# Patient Record
Sex: Male | Born: 1955 | Race: White | Hispanic: No | Marital: Married | Smoking: Never smoker
Health system: Southern US, Community
[De-identification: ages and names within clinical notes are randomized; demographics above are authoritative.]

## PROBLEM LIST (undated history)

## (undated) DIAGNOSIS — R140 Abdominal distension (gaseous): Secondary | ICD-10-CM

## (undated) DIAGNOSIS — C801 Malignant (primary) neoplasm, unspecified: Secondary | ICD-10-CM

## (undated) DIAGNOSIS — C259 Malignant neoplasm of pancreas, unspecified: Secondary | ICD-10-CM

## (undated) DIAGNOSIS — Z87442 Personal history of urinary calculi: Secondary | ICD-10-CM

## (undated) DIAGNOSIS — M792 Neuralgia and neuritis, unspecified: Secondary | ICD-10-CM

## (undated) DIAGNOSIS — F439 Reaction to severe stress, unspecified: Secondary | ICD-10-CM

## (undated) DIAGNOSIS — I10 Essential (primary) hypertension: Secondary | ICD-10-CM

## (undated) DIAGNOSIS — Z87898 Personal history of other specified conditions: Secondary | ICD-10-CM

## (undated) DIAGNOSIS — H9190 Unspecified hearing loss, unspecified ear: Secondary | ICD-10-CM

## (undated) DIAGNOSIS — R5383 Other fatigue: Secondary | ICD-10-CM

## (undated) DIAGNOSIS — R109 Unspecified abdominal pain: Secondary | ICD-10-CM

## (undated) HISTORY — PX: SKIN CANCER EXCISION: SHX779

## (undated) HISTORY — DX: Malignant (primary) neoplasm, unspecified: C80.1

## (undated) HISTORY — DX: Abdominal distension (gaseous): R14.0

## (undated) HISTORY — DX: Unspecified abdominal pain: R10.9

## (undated) HISTORY — DX: Reaction to severe stress, unspecified: F43.9

## (undated) HISTORY — DX: Personal history of other specified conditions: Z87.898

## (undated) HISTORY — DX: Other fatigue: R53.83

## (undated) HISTORY — DX: Malignant neoplasm of pancreas, unspecified: C25.9

## (undated) HISTORY — DX: Unspecified hearing loss, unspecified ear: H91.90

## (undated) HISTORY — PX: APPENDECTOMY: SHX54

## (undated) HISTORY — PX: EYE SURGERY: SHX253

---

## 2006-05-08 ENCOUNTER — Ambulatory Visit: Payer: Self-pay | Admitting: Internal Medicine

## 2006-12-20 ENCOUNTER — Ambulatory Visit: Payer: Self-pay | Admitting: Internal Medicine

## 2006-12-28 ENCOUNTER — Ambulatory Visit: Payer: Self-pay | Admitting: Internal Medicine

## 2006-12-28 ENCOUNTER — Encounter: Payer: Self-pay | Admitting: Internal Medicine

## 2007-02-10 ENCOUNTER — Emergency Department (HOSPITAL_COMMUNITY): Admission: EM | Admit: 2007-02-10 | Discharge: 2007-02-10 | Payer: Self-pay | Admitting: Emergency Medicine

## 2007-02-12 ENCOUNTER — Ambulatory Visit (HOSPITAL_COMMUNITY): Admission: RE | Admit: 2007-02-12 | Discharge: 2007-02-12 | Payer: Self-pay | Admitting: Urology

## 2007-02-14 ENCOUNTER — Emergency Department (HOSPITAL_COMMUNITY): Admission: EM | Admit: 2007-02-14 | Discharge: 2007-02-14 | Payer: Self-pay | Admitting: Emergency Medicine

## 2007-02-21 ENCOUNTER — Ambulatory Visit (HOSPITAL_BASED_OUTPATIENT_CLINIC_OR_DEPARTMENT_OTHER): Admission: RE | Admit: 2007-02-21 | Discharge: 2007-02-21 | Payer: Self-pay | Admitting: Urology

## 2010-11-16 NOTE — Op Note (Signed)
[name], Cory Kelly               ACCOUNT NO.:  [id number]   MEDICAL RECORD NO.:  [id number]          PATIENT TYPE:  AMB   LOCATION:  [facility]                         FACILITY:  [facility]   PHYSICIAN:  Ronald L. Earlene Plater, M.D.  DATE OF BIRTH:  1956-03-10   DATE OF PROCEDURE:  02/21/2007  DATE OF DISCHARGE:                               OPERATIVE REPORT   PREOPERATIVE DIAGNOSIS:  Left ureterolithiasis, status post  extracorporeal shock wave lithotripsy.   OPERATIVE PROCEDURE:  1. Cystourethroscopy.  2. Left retrograde ureteral pyelogram.  3. Left rigid and flexible ureterorenoscopy.  4. Balloon dilatation of left ureteral stricture and placement of left      double-J stent.   SURGEON:  Lucrezia Starch. Earlene Plater, M.D.   ANESTHESIA:  LMA.   ESTIMATED BLOOD LOSS:  Negligible.   TUBES:  A 28 cm 7-French contoured double pigtail stent.   COMPLICATIONS:  None.   PROCEDURE:  Cory Kelly is a very nice 55 year old white male who  presented with left flank pain, nausea, vomiting.  He was found to have  an 8 mm left upper ureteral calculus.  He subsequently went ESWL which  caused partial fragmentation, but the large fragment has obstructed  upper ureter approximately 5 mm and [place] not progressing.  It is  continuing to cause pain and low grade fever.  Understanding the risks,  benefits and alternative, and being properly preoperatively prepared, he  was taken to surgery.   The patient was placed in supine position.  After proper LMA anesthesia,  was placed in the dorsal lithotomy position and prepped and draped with  Betadine in a sterile fashion.  Cystourethroscopy was performed with a  22.5 [place] Olympus panendoscope.  The bladder was essentially noted to  be normal. There was no significant prostatic hypertrophy and efflux of  urine was noted in the normally placed ureteral orifice on the right  side.  The left ureteral orifice was in normal location, but no reflux  of urine was noted.  The 0.038  [place] Glidewire was placed in the left  renal pelvis, past the stone and the lower ureter was dilated with an  inner dilating sheath of the ureteral access catheter.  Attempted rigid  ureteroscopy with the short thin ureteroscope, there was some angulation  of the vessels so this was abandoned.  Utilizing a dual lumen inserter,  a 0.038 [place] Amplatz super stiff wire was placed in the left renal  pelvis and ureteroscope was placed to just below the stone.  It was  noted to be quite tight.  On visualization there was a tight ureteral  stricture inches below the stone fragments.  This is what was felt to be  obstructing the stone.  A 6-French open-ended catheter was placed into  the left renal pelvis and the stone dislocated into the left renal  pelvis.  Dye was injected and there was what felt like some clot within  the kidney but no extravasation, no perforations were noted. Under  visualization with fluoroscopy, utilizing a 4-mm ureteral balloon  catheter to 8 atmospheres of pressure,  the stricture was visually  dilated and inflexible ureterorenoscopy was performed into the renal  pelvis.  Bloody urine was expressed and visualization was performed.  There were multiple small fragments appearing to be flushing around the  kidney.  It was felt that it was well fragmented enough, the chances of  passing the fragments very high with the stricture being dilated.  Flexible ureteroscope was visually removed and under fluoroscopic  guidance a 28-cm 7-French contoured double pigtail stent was placed and  noted be in good position in the left renal pelvis within the bladder.  The bladder was drained.  The panendoscope was removed.  The patient was  taken to recovery room stable.   Plan will be to remove the stent in approximately five days.   _ Left  retrograde ureteropyelogram was performed with a 6-French open-  ended catheter was no extravasation noted.  There was some clot within  the  renal pelvis but no other lesions were noted.  The patient tolerated  the procedure well procedure well.      Ronald L. Earlene Plater, M.D.  Electronically Signed     RLD/MEDQ  D:  02/21/2007  T:  02/22/2007  Job:  [id number]

## 2011-04-15 LAB — POCT HEMOGLOBIN-HEMACUE
Hemoglobin: 14.2
Operator id: 268271

## 2011-04-18 LAB — URINALYSIS, ROUTINE W REFLEX MICROSCOPIC
Nitrite: NEGATIVE
Protein, ur: 30 — AB
Specific Gravity, Urine: 1.021
Urobilinogen, UA: 0.2

## 2011-04-18 LAB — URINE CULTURE
Colony Count: NO GROWTH
Culture: NO GROWTH

## 2011-04-18 LAB — URINE MICROSCOPIC-ADD ON

## 2011-04-18 LAB — DIFFERENTIAL
Basophils Absolute: 0
Basophils Relative: 1
Eosinophils Absolute: 0.1
Eosinophils Relative: 1
Monocytes Absolute: 0.4
Monocytes Relative: 6

## 2011-04-18 LAB — CBC
Hemoglobin: 14.9
Platelets: 234
RDW: 13.2
WBC: 6.4

## 2011-04-18 LAB — COMPREHENSIVE METABOLIC PANEL
ALT: 21
Albumin: 4
Alkaline Phosphatase: 61
Chloride: 105
Potassium: 3.9
Sodium: 138
Total Bilirubin: 2.2 — ABNORMAL HIGH
Total Protein: 6.6

## 2011-10-17 ENCOUNTER — Other Ambulatory Visit: Payer: Self-pay

## 2011-10-17 NOTE — Telephone Encounter (Signed)
Patient has an appt scheduled with Dr Merla Riches on May 1st but ran out of his meds. He would like to know if we could call in enough to last him until his appt.

## 2011-10-17 NOTE — Telephone Encounter (Signed)
Please ask pt the dose of

## 2011-10-17 NOTE — Telephone Encounter (Signed)
Please pull chart so we can ascertain the dose of HCTZ

## 2011-10-17 NOTE — Telephone Encounter (Signed)
Pt needs HCTZ, enough to last him until his appt on May 1.   [facility] [address]

## 2011-10-18 ENCOUNTER — Encounter: Payer: Self-pay | Admitting: Internal Medicine

## 2011-10-18 MED ORDER — HYDROCHLOROTHIAZIDE 12.5 MG PO CAPS: 12.5000 mg | ORAL_CAPSULE | Freq: Every day | ORAL | Status: DC

## 2011-10-18 NOTE — Telephone Encounter (Signed)
I notified the daughter that this has been done

## 2011-11-02 ENCOUNTER — Other Ambulatory Visit: Payer: Self-pay | Admitting: Internal Medicine

## 2011-11-02 ENCOUNTER — Ambulatory Visit (INDEPENDENT_AMBULATORY_CARE_PROVIDER_SITE_OTHER): Payer: BC Managed Care – PPO | Admitting: Internal Medicine

## 2011-11-02 ENCOUNTER — Encounter: Payer: Self-pay | Admitting: Internal Medicine

## 2011-11-02 VITALS — BP 133/94 | HR 56 | Temp 98.5°F | Resp 16 | Ht 75.5 in | Wt 248.8 lb

## 2011-11-02 DIAGNOSIS — E785 Hyperlipidemia, unspecified: Secondary | ICD-10-CM | POA: Insufficient documentation

## 2011-11-02 DIAGNOSIS — I10 Essential (primary) hypertension: Secondary | ICD-10-CM | POA: Insufficient documentation

## 2011-11-02 DIAGNOSIS — Z0000 Encounter for general adult medical examination without abnormal findings: Secondary | ICD-10-CM

## 2011-11-02 LAB — LIPID PANEL: VLDL: 17 mg/dL (ref 0–40)

## 2011-11-02 LAB — COMPREHENSIVE METABOLIC PANEL
CO2: 30 mEq/L (ref 19–32)
Calcium: 9.4 mg/dL (ref 8.4–10.5)
Chloride: 104 mEq/L (ref 96–112)
Creat: 1.18 mg/dL (ref 0.50–1.35)
Glucose, Bld: 93 mg/dL (ref 70–99)
Total Bilirubin: 1.9 mg/dL — ABNORMAL HIGH (ref 0.3–1.2)

## 2011-11-02 MED ORDER — HYDROCHLOROTHIAZIDE 12.5 MG PO CAPS: 12.5000 mg | ORAL_CAPSULE | Freq: Every day | ORAL | Status: DC

## 2011-11-02 NOTE — Progress Notes (Signed)
  Subjective:    Patient ID: Cory Kelly, male    DOB: [birth year], 56 y.o.   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  HENT: Negative.   Eyes: Negative for visual disturbance.  Respiratory: Negative for chest tightness and shortness of breath.        Plays at a high level in a basketball league 3 days a week  Cardiovascular: Negative for chest pain and palpitations.  Gastrointestinal: Negative.   Genitourinary: Negative for urgency and frequency.  Musculoskeletal: Negative for back pain and gait problem.  Neurological: Negative.   Psychiatric/Behavioral: Negative.        Objective:   Physical Exam Vital signs stable except weight 248 pounds HEENT clear No thyromegaly No carotid bruits Heart regular without murmurs rubs or gallops Extremities with good distal pulses and no edema      Assessment & Plan:  Problem #1 hypertension Problem #2 mild overweight Problem #3 mild hyperlipidemia  Routine labs rechecked/will mail results Refill hydrochlorothiazide 12.5 mg for one year Continue home blood pressure monitoring Continue active exercise

## 2011-11-04 LAB — PSA, TOTAL AND FREE
PSA, Free: 0.44 ng/mL
PSA: 3.37 ng/mL (ref <=4.00)

## 2011-11-08 ENCOUNTER — Encounter: Payer: Self-pay | Admitting: Internal Medicine

## 2011-11-30 ENCOUNTER — Telehealth: Payer: Self-pay

## 2011-11-30 DIAGNOSIS — Z87448 Personal history of other diseases of urinary system: Secondary | ICD-10-CM

## 2011-11-30 NOTE — Telephone Encounter (Signed)
I seen where this was ordered on 11/04/11 but I dont see results.  Are they back yet?

## 2011-11-30 NOTE — Telephone Encounter (Signed)
PSA was 3.43 We need the previous result to determine if this is normal for him. I've requested the paper chart.

## 2011-11-30 NOTE — Telephone Encounter (Signed)
PSA has risen compared with last test.  Dr. Merla Riches will review and contact him with the plan for follow-up.

## 2011-11-30 NOTE — Telephone Encounter (Signed)
PT REQUESTING PSA RESULTS,  BEST PHONE  [phone]

## 2011-12-02 NOTE — Telephone Encounter (Signed)
LMOM to call back

## 2011-12-03 NOTE — Telephone Encounter (Signed)
Pt was calling about add on tests

## 2012-01-25 ENCOUNTER — Telehealth: Payer: Self-pay

## 2012-01-25 NOTE — Telephone Encounter (Signed)
Pt states that we [facility] over [facility] blood [facility] [address] but the rx is no longer there because pt did not pick it up on time. Pt would like for [place] to send the rx back over. Best# [id number] or [phone]  [facility]  [address]

## 2012-01-28 NOTE — Telephone Encounter (Signed)
LMOM THAT HE NEEDS TO CALL PHARMACY TO GET RX REFILLED.  IT SHOULD BE ON HIS PROFILE ON HOLD.

## 2012-03-05 DIAGNOSIS — R972 Elevated prostate specific antigen [PSA]: Secondary | ICD-10-CM | POA: Insufficient documentation

## 2012-07-03 ENCOUNTER — Other Ambulatory Visit: Payer: Self-pay | Admitting: Dermatology

## 2012-10-10 ENCOUNTER — Encounter: Payer: Self-pay | Admitting: Internal Medicine

## 2012-10-11 ENCOUNTER — Encounter: Payer: Self-pay | Admitting: Internal Medicine

## 2012-11-09 ENCOUNTER — Other Ambulatory Visit: Payer: Self-pay | Admitting: Internal Medicine

## 2012-11-09 NOTE — Telephone Encounter (Signed)
Pt wanted to schedule an appointment with Dr. Merla Riches for a CPE, but because he is scheduled so far out he has decided to come in next Monday evening to see him.  He is out of his blood pressure medicine and wants to know if we can refill it. [phone]

## 2012-11-09 NOTE — Telephone Encounter (Signed)
Notified pt on VM 1 mos RF sent in.

## 2012-11-12 ENCOUNTER — Ambulatory Visit (INDEPENDENT_AMBULATORY_CARE_PROVIDER_SITE_OTHER): Payer: BC Managed Care – PPO | Admitting: Internal Medicine

## 2012-11-12 VITALS — BP 147/79 | HR 69 | Temp 98.6°F | Resp 16 | Ht 76.5 in | Wt 253.0 lb

## 2012-11-12 DIAGNOSIS — Z0000 Encounter for general adult medical examination without abnormal findings: Secondary | ICD-10-CM

## 2012-11-12 DIAGNOSIS — I10 Essential (primary) hypertension: Secondary | ICD-10-CM

## 2012-11-12 LAB — COMPREHENSIVE METABOLIC PANEL
ALT: 18 U/L (ref 0–53)
Albumin: 4.4 g/dL (ref 3.5–5.2)
CO2: 28 mEq/L (ref 19–32)
Calcium: 9.3 mg/dL (ref 8.4–10.5)
Chloride: 102 mEq/L (ref 96–112)
Glucose, Bld: 89 mg/dL (ref 70–99)
Sodium: 141 mEq/L (ref 135–145)
Total Bilirubin: 1.4 mg/dL — ABNORMAL HIGH (ref 0.3–1.2)
Total Protein: 6.4 g/dL (ref 6.0–8.3)

## 2012-11-12 LAB — POCT CBC
HCT, POC: 42.7 % — AB (ref 43.5–53.7)
Hemoglobin: 14.3 g/dL (ref 14.1–18.1)
Lymph, poc: 1.8 (ref 0.6–3.4)
MCHC: 33.5 g/dL (ref 31.8–35.4)
MPV: 7.4 fL (ref 0–99.8)
POC Granulocyte: 5.2 (ref 2–6.9)
RBC: 4.69 M/uL (ref 4.69–6.13)
WBC: 7.5 10*3/uL (ref 4.6–10.2)

## 2012-11-12 LAB — LIPID PANEL
Cholesterol: 177 mg/dL (ref 0–200)
Total CHOL/HDL Ratio: 4.9 Ratio

## 2012-11-12 NOTE — Progress Notes (Signed)
Subjective:    Patient ID: Cory Kelly, male    DOB: 03/03/1956, 57 y.o.   MRN: [id number]  HPICPE Patient Active Problem List   Diagnosis Date Noted  . Elevated PSA---urol eval =no Ca 03/05/2012  . HTN (hypertension)-usu ok at home/no sxt 11/02/2011  . Hyperlipemia-has gained weight 11/02/2011   Time for colonos #2 Job and fam stable   Review of Systems  Constitutional: Negative for fever, activity change, appetite change, fatigue and unexpected weight change.  HENT: Positive for tinnitus. Negative for hearing loss, ear pain, congestion, trouble swallowing, neck pain, dental problem and voice change.        Long-standing tinnitus/evaluated by ENT and no cause was found/uses noise masking  Eyes: Negative for photophobia and visual disturbance.  Respiratory: Negative for apnea, cough, choking, shortness of breath and wheezing.   Cardiovascular: Negative for chest pain, palpitations and leg swelling.  Gastrointestinal: Negative for nausea, vomiting, abdominal pain, diarrhea, constipation and rectal pain.  Endocrine: Negative for cold intolerance and heat intolerance.  Genitourinary: Negative for frequency and difficulty urinating.  Musculoskeletal: Negative for myalgias, back pain, joint swelling, arthralgias and gait problem.  Skin: Negative for rash.  Neurological: Positive for headaches. Negative for dizziness and numbness.       For the past 3 months he has had episodes of a headache bifrontal or bitemporal associated with doing pushups/he has been able to play basketball without headaches/he occasionally has a sharp headache when he sneezes/this has improved over the last 3 or 4 weeks and has not occurred/there was no associated allergic rhinitis or sinus infection/there was no associated vision disturbance nausea dizziness  Hematological: Negative for adenopathy.  Psychiatric/Behavioral: Negative for sleep disturbance and dysphoric mood.       Objective:   Physical Exam   Constitutional: He is oriented to person, place, and time. He appears well-developed and well-nourished.  HENT:  Head: Normocephalic.  Nose: Nose normal.  Mouth/Throat: Oropharynx is clear and moist.  Right ear obscured TM by cerumen impaction Left ear clear  Eyes: Conjunctivae and EOM are normal. Pupils are equal, round, and reactive to light.  Neck: Normal range of motion. Neck supple. No thyromegaly present.  Cardiovascular: Normal rate, regular rhythm, normal heart sounds and intact distal pulses.   No murmur heard. Pulmonary/Chest: Effort normal and breath sounds normal.  Abdominal: Soft. Bowel sounds are normal. He exhibits no mass.  Musculoskeletal: Normal range of motion. He exhibits no edema and no tenderness.  Lymphadenopathy:    He has no cervical adenopathy.  Neurological: He is alert and oriented to person, place, and time. He has normal reflexes. He displays normal reflexes. No cranial nerve deficit. He exhibits normal muscle tone. Coordination normal.  Skin: No rash noted.  Psychiatric: He has a normal mood and affect. Thought content normal.   R ear irrigated successfully       Assessment & Plan:  CPE HTN ?hyperlip Tinnitus cer impac  Results for orders placed in visit on 11/12/12  COMPREHENSIVE METABOLIC PANEL      Result Value Range   Sodium 141  135 - 145 mEq/L   Potassium 3.8  3.5 - 5.3 mEq/L   Chloride 102  96 - 112 mEq/L   CO2 28  19 - 32 mEq/L   Glucose, Bld 89  70 - 99 mg/dL   BUN 14  6 - 23 mg/dL   Creat 2.95 (*) 6.21 - 1.35 mg/dL   Total Bilirubin 1.4 (*) 0.3 - 1.2 mg/dL  Alkaline Phosphatase 60  39 - 117 U/L   AST 17  0 - 37 U/L   ALT 18  0 - 53 U/L   Total Protein 6.4  6.0 - 8.3 g/dL   Albumin 4.4  3.5 - 5.2 g/dL   Calcium 9.3  8.4 - 96.0 mg/dL  PSA      Result Value Range   PSA 3.89  <=4.00 ng/mL  LIPID PANEL      Result Value Range   Cholesterol 177  0 - 200 mg/dL   Triglycerides 454 (*) <150 mg/dL   HDL 36 (*) >09 mg/dL    Total CHOL/HDL Ratio 4.9     VLDL 57 (*) 0 - 40 mg/dL   LDL Cholesterol 84  0 - 99 mg/dL  POCT CBC      Result Value Range   WBC 7.5  4.6 - 10.2 K/uL   Lymph, poc 1.8  0.6 - 3.4   POC LYMPH PERCENT 24.4  10 - 50 %L   MID (cbc) 0.5  0 - 0.9   POC MID % 6.5  0 - 12 %M   POC Granulocyte 5.2  2 - 6.9   Granulocyte percent 69.1  37 - 80 %G   RBC 4.69  4.69 - 6.13 M/uL   Hemoglobin 14.3  14.1 - 18.1 g/dL   HCT, POC 81.1 (*) 91.4 - 53.7 %   MCV 91.1  80 - 97 fL   MCH, POC 30.5  27 - 31.2 pg   MCHC 33.5  31.8 - 35.4 g/dL   RDW, POC 78.2     Platelet Count, POC 214  142 - 424 K/uL   MPV 7.4  0 - 99.8 fL   Will advise lowfat diet with significant wt loss Will repeat Cr in 1 year

## 2012-11-13 LAB — PSA: PSA: 3.89 ng/mL (ref <=4.00)

## 2012-11-20 ENCOUNTER — Encounter: Payer: Self-pay | Admitting: Internal Medicine

## 2012-11-20 DIAGNOSIS — R7989 Other specified abnormal findings of blood chemistry: Secondary | ICD-10-CM

## 2012-12-11 ENCOUNTER — Other Ambulatory Visit: Payer: Self-pay | Admitting: Internal Medicine

## 2013-06-26 ENCOUNTER — Other Ambulatory Visit: Payer: Self-pay | Admitting: Internal Medicine

## 2013-09-26 ENCOUNTER — Other Ambulatory Visit: Payer: Self-pay | Admitting: Internal Medicine

## 2014-02-10 ENCOUNTER — Other Ambulatory Visit: Payer: Self-pay | Admitting: Physician Assistant

## 2014-03-05 ENCOUNTER — Ambulatory Visit (INDEPENDENT_AMBULATORY_CARE_PROVIDER_SITE_OTHER): Payer: BC Managed Care – PPO | Admitting: Internal Medicine

## 2014-03-05 ENCOUNTER — Encounter: Payer: Self-pay | Admitting: Internal Medicine

## 2014-03-05 VITALS — BP 139/80 | HR 76 | Temp 98.2°F | Resp 16 | Ht 76.5 in | Wt 259.0 lb

## 2014-03-05 DIAGNOSIS — H9313 Tinnitus, bilateral: Secondary | ICD-10-CM

## 2014-03-05 DIAGNOSIS — E785 Hyperlipidemia, unspecified: Secondary | ICD-10-CM

## 2014-03-05 DIAGNOSIS — R21 Rash and other nonspecific skin eruption: Secondary | ICD-10-CM

## 2014-03-05 DIAGNOSIS — H9319 Tinnitus, unspecified ear: Secondary | ICD-10-CM | POA: Insufficient documentation

## 2014-03-05 DIAGNOSIS — Z1211 Encounter for screening for malignant neoplasm of colon: Secondary | ICD-10-CM

## 2014-03-05 DIAGNOSIS — Z0000 Encounter for general adult medical examination without abnormal findings: Secondary | ICD-10-CM

## 2014-03-05 DIAGNOSIS — R7989 Other specified abnormal findings of blood chemistry: Secondary | ICD-10-CM

## 2014-03-05 DIAGNOSIS — I10 Essential (primary) hypertension: Secondary | ICD-10-CM

## 2014-03-05 DIAGNOSIS — R799 Abnormal finding of blood chemistry, unspecified: Secondary | ICD-10-CM

## 2014-03-05 DIAGNOSIS — R972 Elevated prostate specific antigen [PSA]: Secondary | ICD-10-CM

## 2014-03-05 LAB — IFOBT (OCCULT BLOOD): IFOBT: NEGATIVE

## 2014-03-05 LAB — POCT SKIN KOH: Skin KOH, POC: NEGATIVE

## 2014-03-05 MED ORDER — HYDROCHLOROTHIAZIDE 12.5 MG PO CAPS: ORAL_CAPSULE | ORAL | Status: DC

## 2014-03-05 MED ORDER — CLOTRIMAZOLE-BETAMETHASONE 1-0.05 % EX CREA: 1.0000 "application " | TOPICAL_CREAM | Freq: Two times a day (BID) | CUTANEOUS | Status: DC

## 2014-03-05 NOTE — Patient Instructions (Signed)
prostep 2

## 2014-03-05 NOTE — Progress Notes (Signed)
Subjective:    Patient ID: Cory Kelly, male    DOB: March 25, 1956, 58 y.o.   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   HEENT-continue tinnitus / hearing loss--- he has to watch lips now/has been evaluated and doesn't want to pursue hearingaides Remainder of the 14 point review of systems negative per form     Objective:   Physical Exam  Constitutional: He is oriented to person, place, and time. He appears well-developed and well-nourished.  HENT:  Head: Normocephalic.  Right Ear: External ear normal.  Left Ear: External ear normal.  Nose: Nose normal.  Mouth/Throat: Oropharynx is clear and moist.  Tms and canals clear  Eyes: Conjunctivae and EOM are normal. Pupils are equal, round, and reactive to light.  Neck: Normal range of motion. Neck supple. No thyromegaly present.  Cardiovascular: Normal rate, regular rhythm, normal heart sounds and intact distal pulses.   No murmur heard. Pulmonary/Chest: Effort normal and breath sounds normal. No respiratory distress. He has no wheezes. He has no rales.  Abdominal: Soft. Bowel sounds are normal. He exhibits no distension and no mass. There is no tenderness. There is no rebound and no guarding.  No hepatosplenomegaly  Genitourinary: Rectum normal.  Prostate slightly enlarged but soft and symmetrical without nodules  Musculoskeletal: Normal range of motion. He exhibits no edema and no tenderness.   Lymphadenopathy:    He has no cervical adenopathy.  Neurological: He is alert and oriented to person, place, and time. He has normal reflexes. No cranial nerve deficit. He exhibits normal muscle tone. Coordination normal.  Skin: Skin is warm and dry. No rash noted.  Actinic keratosis left ear///scan or skin changes (monitored by Dr. Allyson Sabal) Rash on left hand, dorsum scaly redness with a well-circumscribed border that is slightly flaky  Psychiatric: He has a normal mood and affect. His behavior is normal. Judgment and thought content normal.   BP 139/80  Pulse 76  Temp(Src) 98.2 F (36.8 C)  Resp 16  Ht 6' 4.5" (1.943 m)  Wt 259 lb (117.482 kg)  BMI 31.12 kg/m2  SpO2 94% Wt Readings from Last 3 Encounters:  03/05/14 259 lb (117.482 kg)  11/12/12 253 lb (114.76 kg)  11/02/11 248 lb 12.8 oz (112.855 kg)    KOH= Results for orders placed in visit on 03/05/14  POCT SKIN KOH      Result Value Ref Range   Skin KOH, POC Negative    IFOBT (OCCULT BLOOD)      Result Value Ref Range   IFOBT Negative           Assessment & Plan:  Annual exam Rash and nonspecific skin eruption - Plan: Empiric treatment with Lotrisone for 4 weeks  Elevated PSA - Plan: PSA  Elevated serum creatinine - Plan: Comprehensive metabolic panel  Essential hypertension - Plan: CBC with Differential, Comprehensive metabolic panel  Microzide Hyperlipemia - Plan: Lipid panel  Special screening for malignant neoplasms, colon - Plan: IFOBT POC (occult bld, rslt in office)  Referred for followup colonoscopy Tinnitus, bilateral  Not interested in further exploration at this point regarding hearing loss  Elevated triglycerides-low HDL  Diet, weight loss BMI over 30  These weight loss Recent plantar fasciitis  Discussed physical therapy approach/ProStep 2  He is considering flu vaccine  Meds ordered this encounter  Medications  . hydrochlorothiazide (MICROZIDE) 12.5 MG capsule    Sig: TAKE ONE  CAPSULE BY MOUTH DAILY    Dispense:  90 capsule    Refill:  3

## 2014-03-06 LAB — CBC WITH DIFFERENTIAL/PLATELET
BASOS ABS: 0 10*3/uL (ref 0.0–0.1)
Basophils Relative: 0 % (ref 0–1)
EOS ABS: 0.1 10*3/uL (ref 0.0–0.7)
EOS PCT: 1 % (ref 0–5)
HCT: 42.3 % (ref 39.0–52.0)
Hemoglobin: 15.4 g/dL (ref 13.0–17.0)
Lymphocytes Relative: 27 % (ref 12–46)
Lymphs Abs: 1.8 10*3/uL (ref 0.7–4.0)
MCH: 30.4 pg (ref 26.0–34.0)
MCHC: 36.4 g/dL — ABNORMAL HIGH (ref 30.0–36.0)
MCV: 83.4 fL (ref 78.0–100.0)
Monocytes Absolute: 0.5 10*3/uL (ref 0.1–1.0)
Monocytes Relative: 7 % (ref 3–12)
Neutro Abs: 4.2 10*3/uL (ref 1.7–7.7)
Neutrophils Relative %: 65 % (ref 43–77)
PLATELETS: 210 10*3/uL (ref 150–400)
RBC: 5.07 MIL/uL (ref 4.22–5.81)
RDW: 13.3 % (ref 11.5–15.5)
WBC: 6.5 10*3/uL (ref 4.0–10.5)

## 2014-03-06 LAB — COMPREHENSIVE METABOLIC PANEL
ALT: 20 U/L (ref 0–53)
AST: 16 U/L (ref 0–37)
Albumin: 4.5 g/dL (ref 3.5–5.2)
Alkaline Phosphatase: 64 U/L (ref 39–117)
BILIRUBIN TOTAL: 1.8 mg/dL — AB (ref 0.2–1.2)
BUN: 14 mg/dL (ref 6–23)
CO2: 30 meq/L (ref 19–32)
CREATININE: 1.16 mg/dL (ref 0.50–1.35)
Calcium: 9.4 mg/dL (ref 8.4–10.5)
Chloride: 100 mEq/L (ref 96–112)
Glucose, Bld: 110 mg/dL — ABNORMAL HIGH (ref 70–99)
Potassium: 4.1 mEq/L (ref 3.5–5.3)
Sodium: 139 mEq/L (ref 135–145)
Total Protein: 6.7 g/dL (ref 6.0–8.3)

## 2014-03-06 LAB — LIPID PANEL
CHOL/HDL RATIO: 4.2 ratio
CHOLESTEROL: 194 mg/dL (ref 0–200)
HDL: 46 mg/dL (ref >39)
LDL Cholesterol: 115 mg/dL — ABNORMAL HIGH (ref 0–99)
Triglycerides: 166 mg/dL — ABNORMAL HIGH (ref <150)
VLDL: 33 mg/dL (ref 0–40)

## 2014-03-07 LAB — PSA: PSA: 3.82 ng/mL (ref <=4.00)

## 2014-03-10 ENCOUNTER — Encounter: Payer: Self-pay | Admitting: Internal Medicine

## 2014-11-13 ENCOUNTER — Other Ambulatory Visit: Payer: Self-pay | Admitting: Physician Assistant

## 2014-12-30 ENCOUNTER — Other Ambulatory Visit: Payer: Self-pay | Admitting: Physician Assistant

## 2015-02-10 ENCOUNTER — Other Ambulatory Visit: Payer: Self-pay | Admitting: Physician Assistant

## 2015-03-12 ENCOUNTER — Other Ambulatory Visit: Payer: Self-pay

## 2015-03-12 NOTE — Telephone Encounter (Signed)
Opened in error

## 2015-03-18 ENCOUNTER — Ambulatory Visit: Payer: BLUE CROSS/BLUE SHIELD | Admitting: Neurology

## 2015-06-01 ENCOUNTER — Other Ambulatory Visit: Payer: Self-pay | Admitting: Internal Medicine

## 2015-08-30 ENCOUNTER — Other Ambulatory Visit: Payer: Self-pay | Admitting: Internal Medicine

## 2015-10-27 ENCOUNTER — Ambulatory Visit (INDEPENDENT_AMBULATORY_CARE_PROVIDER_SITE_OTHER): Payer: BLUE CROSS/BLUE SHIELD | Admitting: Physician Assistant

## 2015-10-27 VITALS — BP 124/82 | HR 67 | Temp 98.3°F | Resp 18 | Ht 76.5 in | Wt 254.6 lb

## 2015-10-27 DIAGNOSIS — Z13228 Encounter for screening for other metabolic disorders: Secondary | ICD-10-CM | POA: Diagnosis not present

## 2015-10-27 DIAGNOSIS — R7989 Other specified abnormal findings of blood chemistry: Secondary | ICD-10-CM

## 2015-10-27 DIAGNOSIS — Z0000 Encounter for general adult medical examination without abnormal findings: Secondary | ICD-10-CM

## 2015-10-27 DIAGNOSIS — Z1159 Encounter for screening for other viral diseases: Secondary | ICD-10-CM

## 2015-10-27 DIAGNOSIS — R972 Elevated prostate specific antigen [PSA]: Secondary | ICD-10-CM | POA: Diagnosis not present

## 2015-10-27 DIAGNOSIS — Z114 Encounter for screening for human immunodeficiency virus [HIV]: Secondary | ICD-10-CM

## 2015-10-27 DIAGNOSIS — E785 Hyperlipidemia, unspecified: Secondary | ICD-10-CM | POA: Diagnosis not present

## 2015-10-27 DIAGNOSIS — Z130 Encounter for screening for diseases of the blood and blood-forming organs and certain disorders involving the immune mechanism: Secondary | ICD-10-CM

## 2015-10-27 DIAGNOSIS — R748 Abnormal levels of other serum enzymes: Secondary | ICD-10-CM | POA: Diagnosis not present

## 2015-10-27 LAB — CBC WITH DIFFERENTIAL/PLATELET
Basophils Absolute: 70 cells/uL (ref 0–200)
Basophils Relative: 1 %
EOS PCT: 1 %
Eosinophils Absolute: 70 cells/uL (ref 15–500)
HCT: 44.9 % (ref 38.5–50.0)
Hemoglobin: 16.4 g/dL (ref 13.2–17.1)
LYMPHS PCT: 29 %
Lymphs Abs: 2030 cells/uL (ref 850–3900)
MCH: 30.3 pg (ref 27.0–33.0)
MCHC: 36.5 g/dL — ABNORMAL HIGH (ref 32.0–36.0)
MCV: 82.8 fL (ref 80.0–100.0)
MONOS PCT: 10 %
MPV: 8.5 fL (ref 7.5–12.5)
Monocytes Absolute: 700 cells/uL (ref 200–950)
NEUTROS PCT: 59 %
Neutro Abs: 4130 cells/uL (ref 1500–7800)
PLATELETS: 195 10*3/uL (ref 140–400)
RBC: 5.42 MIL/uL (ref 4.20–5.80)
RDW: 13.7 % (ref 11.0–15.0)
WBC: 7 10*3/uL (ref 3.8–10.8)

## 2015-10-27 LAB — LIPID PANEL
CHOL/HDL RATIO: 4.5 ratio (ref <=5.0)
Cholesterol: 215 mg/dL — ABNORMAL HIGH (ref 125–200)
HDL: 48 mg/dL (ref >=40)
LDL CALC: 132 mg/dL — AB (ref <130)
Triglycerides: 177 mg/dL — ABNORMAL HIGH (ref <150)
VLDL: 35 mg/dL — AB (ref <30)

## 2015-10-27 LAB — COMPLETE METABOLIC PANEL WITH GFR
ALBUMIN: 4.7 g/dL (ref 3.6–5.1)
ALK PHOS: 70 U/L (ref 40–115)
ALT: 18 U/L (ref 9–46)
AST: 15 U/L (ref 10–35)
BUN: 15 mg/dL (ref 7–25)
CO2: 29 mmol/L (ref 20–31)
Calcium: 9.6 mg/dL (ref 8.6–10.3)
Chloride: 97 mmol/L — ABNORMAL LOW (ref 98–110)
Creat: 1.27 mg/dL (ref 0.70–1.33)
GFR, Est African American: 72 mL/min (ref >=60)
GFR, Est Non African American: 62 mL/min (ref >=60)
Glucose, Bld: 89 mg/dL (ref 65–99)
Potassium: 3.7 mmol/L (ref 3.5–5.3)
SODIUM: 138 mmol/L (ref 135–146)
TOTAL PROTEIN: 6.9 g/dL (ref 6.1–8.1)
Total Bilirubin: 2 mg/dL — ABNORMAL HIGH (ref 0.2–1.2)

## 2015-10-27 LAB — HEPATITIS C ANTIBODY: HCV Ab: NEGATIVE

## 2015-10-27 NOTE — Patient Instructions (Addendum)
Keeping you healthy  Get these tests  Blood pressure- Have your blood pressure checked once a year by your healthcare provider.  Normal blood pressure is 120/80  Weight- Have your body mass index (BMI) calculated to screen for obesity.  BMI is a measure of body fat based on height and weight. You can also calculate your own BMI at [email or web address].com/bmi/.  Cholesterol- Have your cholesterol checked every year.  Diabetes- Have your blood sugar checked regularly if you have high blood pressure, high cholesterol, have a family history of diabetes or if you are overweight.  Screening for Colon Cancer- Colonoscopy starting at age 50.  Screening may begin sooner depending on your family history and other health conditions. Follow up colonoscopy as directed by your Gastroenterologist.  Screening for Prostate Cancer- Both blood work (PSA) and a rectal exam help screen for Prostate Cancer.  Screening begins at age 40 with African-American men and at age 50 with Caucasian men.  Screening may begin sooner depending on your family history.  Take these medicines  Aspirin- One aspirin daily can help prevent Heart disease and Stroke.  Flu shot- Every fall.  Tetanus- Every 10 years.  Zostavax- Once after the age of 60 to prevent Shingles.  Pneumonia shot- Once after the age of 65; if you are younger than 65, ask your healthcare provider if you need a Pneumonia shot.  Take these steps  Don't smoke- If you do smoke, talk to your doctor about quitting.  For tips on how to quit, go to www.smokefree.gov or call 1-800-QUIT-NOW.  Be physically active- Exercise 5 days a week for at least 30 minutes.  If you are not already physically active start slow and gradually work up to 30 minutes of moderate physical activity.  Examples of moderate activity include walking briskly, mowing the yard, dancing, swimming, bicycling, etc.  Eat a healthy diet- Eat a variety of healthy food such as fruits, vegetables, low  fat milk, low fat cheese, yogurt, lean meant, poultry, fish, beans, tofu, etc. For more information go to www.thenutritionsource.org  Drink alcohol in moderation- Limit alcohol intake to less than two drinks a day. Never drink and drive.  Dentist- Brush and floss twice daily; visit your dentist twice a year.  Depression- Your emotional health is as important as your physical health. If you're feeling down, or losing interest in things you would normally enjoy please talk to your healthcare provider.  Eye exam- Visit your eye doctor every year.  Safe sex- If you may be exposed to a sexually transmitted infection, use a condom.  Seat belts- Seat belts can save your life; always wear one.  Smoke/Carbon Monoxide detectors- These detectors need to be installed on the appropriate level of your home.  Replace batteries at least once a year.  Skin cancer- When out in the sun, cover up and use sunscreen 15 SPF or higher.  Violence- If anyone is threatening you, please tell your healthcare provider.  Living Will/ Health care power of attorney- Speak with your healthcare provider and family.    IF you received an x-ray today, you will receive an invoice from [facility]. Please contact [facility] at [phone] with questions or concerns regarding your invoice.   IF you received labwork today, you will receive an invoice from [facility]. Please contact [facility] at [phone] with questions or concerns regarding your invoice.   Our billing staff will not be able to assist you with questions regarding bills from these companies.    You will be contacted with the lab results as soon as they are available. The fastest way to get your results is to activate your My Chart account. Instructions are located on the last page of this paperwork. If you have not heard from us regarding the results in 2 weeks, please contact this office.      

## 2015-10-27 NOTE — Progress Notes (Signed)
pat  Cory Kelly  MRN: [id number] DOB: 1955-12-28  Subjective:  Pt presents to clinic for a CPE.  He needs it for his insurance to lower premiums.  He is having no problems.  He does not take his BP regularly and he notices when he is on them he has to urinate a lot more than normal.  He has noticed no change in his urinary stream.  Last dental exam: not recently Last vision exam: not recently - Lasix in 1 eye so he has far and near vision  Last colonoscopy: 2008 - repeat every 5 years due to polyp removal - he has gotten the phone call and he has not called them back Vaccinations      Tetanus - 04/2006 - declines getting today      Patient Active Problem List   Diagnosis Date Noted  . 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 Elevated serum creatinine 11/20/2012  . Elevated PSA 03/05/2012  . HTN (hypertension) 11/02/2011  . Hyperlipemia 11/02/2011    Current Outpatient Prescriptions on File Prior to Visit  Medication Sig Dispense Refill  . clotrimazole-betamethasone (LOTRISONE) cream Apply 1 application topically 2 (two) times daily. For 4 weeks (Patient not taking: Reported on 10/27/2015) 30 g 1  . hydrochlorothiazide (MICROZIDE) 12.5 MG capsule TAKE ONE CAPSULE BY MOUTH DAILY  "NO MORE REFILLS WITHOUT OFFICE VISIT" (Patient not taking: Reported on 10/27/2015) 90 capsule 0   No current facility-administered medications on file prior to visit.    No Known Allergies  Social History   Social History  . Marital Status: Married    Spouse Name: N/A  . Number of Children: N/A  . Years of Education: N/A   Social History Main Topics  . Smoking status: Never Smoker   . Smokeless tobacco: None  . Alcohol Use: No  . Drug Use: No  . Sexual Activity: Yes    Birth Control/ Protection: None   Other Topics Concern  . None   Social History Narrative   Married   Sports administrator womens basketball at [employer]    Past Surgical History  Procedure Laterality Date  . Appendectomy    . Eye  surgery    . Skin cancer excision      Family History  Problem Relation Age of Onset  . Hyperlipidemia Father   . Heart disease Father     Review of Systems  Constitutional: Negative.   HENT: Negative.   Eyes: Negative.   Respiratory: Negative.   Cardiovascular: Negative.   Gastrointestinal: Negative.   Endocrine: Negative.   Genitourinary: Negative.   Musculoskeletal: Negative.   Skin: Negative.   Allergic/Immunologic: Negative.   Neurological: Negative.   Hematological: Negative.   Psychiatric/Behavioral: Negative.    Objective:  BP 124/82 mmHg  Pulse 67  Temp(Src) 98.3 F (36.8 C) (Oral)  Resp 18  Ht 6' 4.5" (1.943 m)  Wt 254 lb 9.6 oz (115.486 kg)  BMI 30.59 kg/m2  SpO2 98%  Physical Exam  Constitutional: He is oriented to person, place, and time and well-developed, well-nourished, and in no distress.  HENT:  Head: Normocephalic and atraumatic.  Right Ear: Hearing, tympanic membrane, external ear and ear canal normal.  Left Ear: Hearing, tympanic membrane, external ear and ear canal normal.  Nose: Nose normal.  Mouth/Throat: Uvula is midline, oropharynx is clear and moist and mucous membranes are normal.  Eyes: Conjunctivae and EOM are normal. Pupils are equal, round, and reactive to light.  Neck: Trachea normal and normal range of motion.  Neck supple. No thyroid mass and no thyromegaly present.  Cardiovascular: Normal rate, regular rhythm and normal heart sounds.   No murmur heard. Pulmonary/Chest: Effort normal and breath sounds normal.  Abdominal: Soft. Bowel sounds are normal.  Genitourinary: Rectum normal, prostate normal, testes/scrotum normal and penis normal.  Musculoskeletal: Normal range of motion.  Neurological: He is alert and oriented to person, place, and time. Gait normal.  Skin: Skin is warm and dry. Rash (multiple actinic keratosis on hands (he sees dermatology for these)) noted.  Psychiatric: Mood, memory, affect and judgment normal.     Visual Acuity Screening   Right eye Left eye Both eyes  Without correction: 20/100 20/15 20/15  With correction:       Assessment and Plan :  Annual physical exam  Elevated PSA - Plan: PSA - elevated in the past - normal prostate exam today -   Screening for HIV (human immunodeficiency virus) - Plan: HIV antibody  Need for hepatitis C screening test - Plan: Hepatitis C antibody  Screening for deficiency anemia - Plan: CBC with Differential/Platelet  Screening for metabolic disorder  Hyperlipemia - Plan: Lipid panel  Elevated serum creatinine - Plan: COMPLETE METABOLIC PANEL WITH GFR  Check labs - treat as needed based on those results.  He is in time frame for a repeat colonoscopy and he will call and make that appt.  Suggested dental care.  He will continue healthy lifestyle.  At this point his BP is well controlled so he will stop the diuretic to help his urinary symptoms.  Windell Hummingbird PA-C  [facility] 10/27/2015 4:23 PM

## 2015-10-28 ENCOUNTER — Encounter: Payer: Self-pay | Admitting: Physician Assistant

## 2015-10-28 LAB — HIV ANTIBODY (ROUTINE TESTING W REFLEX): HIV: NONREACTIVE

## 2015-10-28 LAB — PSA: PSA: 3.73 ng/mL (ref <=4.00)

## 2016-02-18 ENCOUNTER — Other Ambulatory Visit: Payer: Self-pay | Admitting: Physician Assistant

## 2016-02-18 MED ORDER — HYDROCHLOROTHIAZIDE 12.5 MG PO CAPS: ORAL_CAPSULE | ORAL | 1 refills | Status: DC

## 2016-06-08 ENCOUNTER — Ambulatory Visit: Payer: BLUE CROSS/BLUE SHIELD

## 2016-06-10 ENCOUNTER — Telehealth: Payer: Self-pay

## 2016-06-10 ENCOUNTER — Ambulatory Visit (INDEPENDENT_AMBULATORY_CARE_PROVIDER_SITE_OTHER): Payer: BLUE CROSS/BLUE SHIELD | Admitting: Physician Assistant

## 2016-06-10 VITALS — BP 160/96 | HR 52 | Temp 97.5°F | Resp 16 | Ht 76.5 in | Wt 255.4 lb

## 2016-06-10 DIAGNOSIS — L723 Sebaceous cyst: Secondary | ICD-10-CM

## 2016-06-10 DIAGNOSIS — L989 Disorder of the skin and subcutaneous tissue, unspecified: Secondary | ICD-10-CM

## 2016-06-10 DIAGNOSIS — I10 Essential (primary) hypertension: Secondary | ICD-10-CM

## 2016-06-10 MED ORDER — AMLODIPINE BESYLATE 5 MG PO TABS: 5.0000 mg | ORAL_TABLET | Freq: Every day | ORAL | 0 refills | Status: DC

## 2016-06-10 NOTE — Patient Instructions (Signed)
     IF you received an x-ray today, you will receive an invoice from [facility]. Please contact [facility] at [phone] with questions or concerns regarding your invoice.   IF you received labwork today, you will receive an invoice from [facility]. Please contact [facility] at [phone] with questions or concerns regarding your invoice.   Our billing staff will not be able to assist you with questions regarding bills from these companies.  You will be contacted with the lab results as soon as they are available. The fastest way to get your results is to activate your My Chart account. Instructions are located on the last page of this paperwork. If you have not heard from us regarding the results in 2 weeks, please contact this office.      

## 2016-06-14 DIAGNOSIS — L820 Inflamed seborrheic keratosis: Secondary | ICD-10-CM | POA: Diagnosis not present

## 2016-06-14 NOTE — Progress Notes (Signed)
   Cory Kelly  MRN: [id number] DOB: 12-Nov-1955  Subjective:  Pt presents to clinic with lesion on his right jaw line that he has had for a while and it is irritating him when he shaves and he would like it removed.  He also has a lump on the back on his left neck that gets bigger and he squeezes it and foul smelling stuff comes out and he would like that removed - he has a larger area on his left back that bothers him when people touch him they can feel it.    He has stopped taking his HTN medication because they were increasing his urination and that was causing problems with his job.  Review of Systems  Patient Active Problem List   Diagnosis Date Noted  . 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 Elevated serum creatinine 11/20/2012  . Elevated PSA 03/05/2012  . HTN (hypertension) 11/02/2011  . Hyperlipemia 11/02/2011    No current outpatient prescriptions on file prior to visit.   No current facility-administered medications on file prior to visit.     No Known Allergies  Pt patients past, family and social history were reviewed and updated.   Objective:  BP (!) 160/96   Pulse (!) 52   Temp 97.5 F (36.4 C) (Oral)   Resp 16   Ht 6' 4.5" (1.943 m)   Wt 255 lb 6.4 oz (115.8 kg)   SpO2 98%   BMI 30.68 kg/m   Physical Exam  Constitutional: He is oriented to person, place, and time and well-developed, well-nourished, and in no distress.  HENT:  Head: Normocephalic and atraumatic.  Right Ear: External ear normal.  Left Ear: External ear normal.  Eyes: Conjunctivae are normal.  Neck: Normal range of motion.  Pulmonary/Chest: Effort normal.  Neurological: He is alert and oriented to person, place, and time. Gait normal.  Skin: Skin is warm and dry.  1/2 cm flesh colored lesion that is irritated -  Left neck - 1/4cm sebaceous cyst with central pore - no surrounding erythema Left mid back - 1.5cm sebaceous cyst with central pore - no surrounding erythema  Multiple seborrhea  keratosis  Psychiatric: Mood, memory, affect and judgment normal.   Procedures: 1- right jaw line - local anesthesia with 1% lido with epi shave biopsy - sent for biopsy - dressing for hemastasis 2- left neck - local anesthesia - betadine prep - 38mm punch - Biopsy to remove the central pore - sebaceous cyst wall and material removed - most scar tissue - drsg placed 3- left mid back - betadine prep - #15 blade used to make a 2cm incision - sebaceous cyst removed - incision closed with 6-0 Prolene #3 horizontal sutures - dressing placed Assessment and Plan :  Essential hypertension - Plan: amLODipine (NORVASC) 5 MG tablet - change medication - pt needs to be on medication - he is slightly nervous today and he will continue to check his BP at home and let me know what it is reading but I suspect that the patient will need the medication - we will change from a diuretic to Norvasc in hopes that his urination increase.  Face lesion - Plan: Dermatology pathology - sent for pathology -   Sebaceous cyst - removed - wound care d/w pt.  Windell Hummingbird PA-C  [facility] 06/14/2016 8:54 AM

## 2016-09-15 ENCOUNTER — Other Ambulatory Visit: Payer: Self-pay | Admitting: Physician Assistant

## 2016-09-15 DIAGNOSIS — I10 Essential (primary) hypertension: Secondary | ICD-10-CM

## 2016-09-18 ENCOUNTER — Other Ambulatory Visit: Payer: Self-pay | Admitting: Physician Assistant

## 2016-09-18 DIAGNOSIS — I10 Essential (primary) hypertension: Secondary | ICD-10-CM

## 2016-10-05 NOTE — Telephone Encounter (Signed)
VM left 

## 2016-10-08 ENCOUNTER — Other Ambulatory Visit: Payer: Self-pay | Admitting: Physician Assistant

## 2016-10-08 DIAGNOSIS — I10 Essential (primary) hypertension: Secondary | ICD-10-CM

## 2017-07-31 ENCOUNTER — Encounter: Payer: Self-pay | Admitting: Internal Medicine

## 2017-07-31 ENCOUNTER — Telehealth: Payer: Self-pay | Admitting: Internal Medicine

## 2017-07-31 ENCOUNTER — Other Ambulatory Visit: Payer: Self-pay | Admitting: Physician Assistant

## 2017-07-31 DIAGNOSIS — Z86010 Personal history of colonic polyps: Secondary | ICD-10-CM

## 2017-07-31 NOTE — Progress Notes (Signed)
Pt needs a colonoscopy.  He had polyps removed in 2008.

## 2017-07-31 NOTE — Telephone Encounter (Signed)
Sure ok

## 2017-07-31 NOTE — Telephone Encounter (Signed)
Colonoscopy scheduled.

## 2017-08-02 ENCOUNTER — Encounter: Payer: Self-pay | Admitting: Gastroenterology

## 2017-08-02 ENCOUNTER — Ambulatory Visit (AMBULATORY_SURGERY_CENTER): Payer: Self-pay

## 2017-08-02 VITALS — Ht 76.5 in | Wt 243.2 lb

## 2017-08-02 DIAGNOSIS — Z86010 Personal history of colonic polyps: Secondary | ICD-10-CM

## 2017-08-02 MED ORDER — NA SULFATE-K SULFATE-MG SULF 17.5-3.13-1.6 GM/177ML PO SOLN: 1.0000 | Freq: Once | ORAL | 0 refills | Status: AC

## 2017-08-02 NOTE — Progress Notes (Signed)
Per pt, no allergies to soy or egg products.Pt not taking any weight loss meds or using  O2 at home.  Pt refused emmi video. 

## 2017-08-03 ENCOUNTER — Other Ambulatory Visit: Payer: Self-pay

## 2017-08-03 ENCOUNTER — Emergency Department (HOSPITAL_COMMUNITY): Payer: BLUE CROSS/BLUE SHIELD

## 2017-08-03 ENCOUNTER — Inpatient Hospital Stay (HOSPITAL_COMMUNITY)
Admission: EM | Admit: 2017-08-03 | Discharge: 2017-08-05 | DRG: 440 | Disposition: A | Discharge status: Home / Self Care | Payer: BLUE CROSS/BLUE SHIELD | Attending: Internal Medicine | Admitting: Internal Medicine

## 2017-08-03 ENCOUNTER — Encounter (HOSPITAL_COMMUNITY): Payer: Self-pay

## 2017-08-03 DIAGNOSIS — R1900 Intra-abdominal and pelvic swelling, mass and lump, unspecified site: Secondary | ICD-10-CM | POA: Diagnosis present

## 2017-08-03 DIAGNOSIS — D696 Thrombocytopenia, unspecified: Secondary | ICD-10-CM | POA: Diagnosis not present

## 2017-08-03 DIAGNOSIS — K8500 Idiopathic acute pancreatitis without necrosis or infection: Secondary | ICD-10-CM | POA: Diagnosis not present

## 2017-08-03 DIAGNOSIS — R7989 Other specified abnormal findings of blood chemistry: Secondary | ICD-10-CM

## 2017-08-03 DIAGNOSIS — K851 Biliary acute pancreatitis without necrosis or infection: Secondary | ICD-10-CM | POA: Diagnosis not present

## 2017-08-03 DIAGNOSIS — K859 Acute pancreatitis without necrosis or infection, unspecified: Secondary | ICD-10-CM | POA: Diagnosis not present

## 2017-08-03 DIAGNOSIS — Z79899 Other long term (current) drug therapy: Secondary | ICD-10-CM | POA: Diagnosis not present

## 2017-08-03 DIAGNOSIS — H9190 Unspecified hearing loss, unspecified ear: Secondary | ICD-10-CM | POA: Diagnosis present

## 2017-08-03 DIAGNOSIS — N200 Calculus of kidney: Secondary | ICD-10-CM | POA: Diagnosis not present

## 2017-08-03 DIAGNOSIS — Z85828 Personal history of other malignant neoplasm of skin: Secondary | ICD-10-CM

## 2017-08-03 DIAGNOSIS — I77811 Abdominal aortic ectasia: Secondary | ICD-10-CM | POA: Diagnosis present

## 2017-08-03 DIAGNOSIS — J439 Emphysema, unspecified: Secondary | ICD-10-CM | POA: Diagnosis present

## 2017-08-03 DIAGNOSIS — R079 Chest pain, unspecified: Secondary | ICD-10-CM

## 2017-08-03 DIAGNOSIS — R933 Abnormal findings on diagnostic imaging of other parts of digestive tract: Secondary | ICD-10-CM | POA: Diagnosis not present

## 2017-08-03 DIAGNOSIS — R160 Hepatomegaly, not elsewhere classified: Secondary | ICD-10-CM | POA: Diagnosis not present

## 2017-08-03 DIAGNOSIS — K5900 Constipation, unspecified: Secondary | ICD-10-CM | POA: Diagnosis not present

## 2017-08-03 DIAGNOSIS — R109 Unspecified abdominal pain: Secondary | ICD-10-CM | POA: Diagnosis not present

## 2017-08-03 DIAGNOSIS — I160 Hypertensive urgency: Secondary | ICD-10-CM | POA: Diagnosis present

## 2017-08-03 DIAGNOSIS — R188 Other ascites: Secondary | ICD-10-CM | POA: Diagnosis not present

## 2017-08-03 DIAGNOSIS — C4491 Basal cell carcinoma of skin, unspecified: Secondary | ICD-10-CM

## 2017-08-03 DIAGNOSIS — I10 Essential (primary) hypertension: Secondary | ICD-10-CM | POA: Diagnosis not present

## 2017-08-03 DIAGNOSIS — R634 Abnormal weight loss: Secondary | ICD-10-CM | POA: Diagnosis not present

## 2017-08-03 DIAGNOSIS — R945 Abnormal results of liver function studies: Secondary | ICD-10-CM

## 2017-08-03 DIAGNOSIS — R1010 Upper abdominal pain, unspecified: Secondary | ICD-10-CM | POA: Diagnosis not present

## 2017-08-03 DIAGNOSIS — E785 Hyperlipidemia, unspecified: Secondary | ICD-10-CM | POA: Diagnosis not present

## 2017-08-03 HISTORY — DX: Essential (primary) hypertension: I10

## 2017-08-03 LAB — I-STAT TROPONIN, ED
TROPONIN I, POC: 0.01 ng/mL (ref 0.00–0.08)
Troponin i, poc: 0.01 ng/mL (ref 0.00–0.08)

## 2017-08-03 LAB — CBC
HEMATOCRIT: 42 % (ref 39.0–52.0)
Hemoglobin: 14.7 g/dL (ref 13.0–17.0)
MCH: 30.2 pg (ref 26.0–34.0)
MCHC: 35 g/dL (ref 30.0–36.0)
MCV: 86.2 fL (ref 78.0–100.0)
PLATELETS: 120 10*3/uL — AB (ref 150–400)
RBC: 4.87 MIL/uL (ref 4.22–5.81)
RDW: 12.9 % (ref 11.5–15.5)
WBC: 6 10*3/uL (ref 4.0–10.5)

## 2017-08-03 LAB — BASIC METABOLIC PANEL
Anion gap: 11 (ref 5–15)
BUN: 8 mg/dL (ref 6–20)
CHLORIDE: 104 mmol/L (ref 101–111)
CO2: 23 mmol/L (ref 22–32)
CREATININE: 0.96 mg/dL (ref 0.61–1.24)
Calcium: 8.9 mg/dL (ref 8.9–10.3)
GFR calc non Af Amer: >60 mL/min (ref >60)
Glucose, Bld: 109 mg/dL — ABNORMAL HIGH (ref 65–99)
POTASSIUM: 3.6 mmol/L (ref 3.5–5.1)
Sodium: 138 mmol/L (ref 135–145)

## 2017-08-03 MED ORDER — IOPAMIDOL (ISOVUE-300) INJECTION 61%
INTRAVENOUS | Status: AC
  Administered 2017-08-03: 100 mL
  Filled 2017-08-03: qty 100

## 2017-08-03 MED ORDER — MORPHINE SULFATE (PF) 4 MG/ML IV SOLN
4.0000 mg | Freq: Once | INTRAVENOUS | Status: AC
  Administered 2017-08-03: 4 mg via INTRAVENOUS
  Filled 2017-08-03: qty 1

## 2017-08-03 MED ORDER — ONDANSETRON HCL 4 MG/2ML IJ SOLN
4.0000 mg | Freq: Once | INTRAMUSCULAR | Status: AC
  Administered 2017-08-03: 4 mg via INTRAVENOUS
  Filled 2017-08-03: qty 2

## 2017-08-03 NOTE — ED Provider Notes (Signed)
[facility] EMERGENCY DEPARTMENT Provider Note   CSN: [id number] Arrival date & time: 08/03/17  1853     History   Chief Complaint Chief Complaint  Patient presents with  . Chest Pain    HPI Dameian Crisman is a 62 y.o. male.  HPI  Patient presents with complaint of abdominal discomfort.  He states that he has had abdominal cramping and lower abdominal pain over the past several weeks intermittently.  The pain is been more constant over the past 4-5 days.  He was seen at his doctor's who prescribed Zantac and MiraLAX.  He states initially this was helping but now the pain is worse.  It is not associated with eating.  The pain tonight was worse and is upper abdomen and lower chest.  He took his blood pressure today and it was 190/120.  He has a history of remote hypertension but has not needed to take medication for over a year.  He denies headache.  No shortness of breath no radiation of chest pain.  No diaphoresis.  He has no leg swelling.  No fever or chills.  No vomiting.  There are no other associated systemic symptoms, there are no other alleviating or modifying factors.   Past Medical History:  Diagnosis Date  . Abdominal pain    due to bloating  . Bloating   . Cancer ([place])    skin cancer  . Fatigue   . History of weight loss   . HOH (hard of hearing)    no hearing aids  . Stress    loss of father in Dec 2018.    Patient Active Problem List   Diagnosis Date Noted  . 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 Elevated serum creatinine 11/20/2012  . Elevated PSA 03/05/2012  . HTN (hypertension) 11/02/2011  . Hyperlipemia 11/02/2011    Past Surgical History:  Procedure Laterality Date  . APPENDECTOMY    . EYE SURGERY     lasik/left eye  . SKIN CANCER EXCISION         Home Medications    Prior to Admission medications   Medication Sig Start Date End Date Taking? Authorizing Provider  losartan (COZAAR) 25 MG tablet Take 25 mg by mouth daily.   Yes  [provider]  magnesium 30 MG tablet Take 30 mg by mouth at bedtime.    Yes [provider]  Polyethylene Glycol 3350 (MIRALAX PO) Take 1 Applicatorful by mouth at bedtime. Take 1 capful daily    Yes [provider]  ranitidine (ZANTAC) 150 MG tablet Take 150 mg by mouth 2 (two) times daily.   Yes [provider]  Simethicone (GAS-X PO) Take 2 tablets by mouth 2 (two) times daily. Take 2 pills daily    Yes [provider]    Family History Family History  Problem Relation Age of Onset  . Hyperlipidemia Father   . Heart disease Father   . Dementia Father   . Memory loss Mother     Social History Social History   Tobacco Use  . Smoking status: Never Smoker  . Smokeless tobacco: Never Used  Substance Use Topics  . Alcohol use: No  . Drug use: No     Allergies   Patient has no known allergies.   Review of Systems Review of Systems  ROS reviewed and all otherwise negative except for mentioned in HPI   Physical Exam Updated Vital Signs BP (!) 170/90   Pulse 60   Temp  98.5 F (36.9 C) (Oral)   Resp (!) 26   SpO2 96%  Vitals reviewed Physical Exam  Physical Examination: General appearance - alert, well appearing, and in no distress Mental status - alert, oriented to person, place, and time Eyes - no conjunctival injection, no scleral icterus Mouth - mucous membranes moist, pharynx normal without lesions Neck - supple, no significant adenopathy Chest - clear to auscultation, no wheezes, rales or rhonchi, symmetric air entry Heart - normal rate, regular rhythm, normal S1, S2, no murmurs, rubs, clicks or gallops Abdomen - soft, mild diffuse tenderness, no gaurding or rebound, nabs, nondistended, no masses or organomegaly Neurological - alert, oriented, normal speech,  Extremities - peripheral pulses normal, no pedal edema, no clubbing or cyanosis Skin - normal coloration and turgor, no rashes,    ED Treatments / Results    Labs (all labs ordered are listed, but only abnormal results are displayed) Labs Reviewed  BASIC METABOLIC PANEL - Abnormal; Notable for the following components:      Result Value   Glucose, Bld 109 (*)    All other components within normal limits  CBC - Abnormal; Notable for the following components:   Platelets 120 (*)    All other components within normal limits  I-STAT TROPONIN, ED  I-STAT TROPONIN, ED    EKG  EKG Interpretation  Date/Time:  Thursday August 03 2017 19:05:31 EST Ventricular Rate:  66 PR Interval:  156 QRS Duration: 92 QT Interval:  436 QTC Calculation: 457 R Axis:   72 Text Interpretation:  Normal sinus rhythm Cannot rule out Inferior infarct , age undetermined Abnormal ECG No significant change since last tracing Confirmed by Alfonzo Beers [phone]) on 08/03/2017 10:32:55 PM       Radiology Dg Chest 2 View  Result Date: 08/03/2017 CLINICAL DATA:  Central chest pain for 3 weeks. EXAM: CHEST  2 VIEW COMPARISON:  February 21, 2007 FINDINGS: The heart size and mediastinal contours are within normal limits. Both lungs are clear. The visualized skeletal structures are unremarkable. IMPRESSION: No active cardiopulmonary disease. Electronically Signed   By: Dorise Bullion III M.D   On: 08/03/2017 19:30   Ct Abdomen Pelvis W Contrast  Result Date: 08/03/2017 CLINICAL DATA:  Lower abdominal pain, back pain, and constipation for 1 month. EXAM: CT ABDOMEN AND PELVIS WITH CONTRAST TECHNIQUE: Multidetector CT imaging of the abdomen and pelvis was performed using the standard protocol following bolus administration of intravenous contrast. CONTRAST:  100 mL Isovue-300 COMPARISON:  02/10/2007 FINDINGS: Lower chest: Emphysematous changes and atelectasis in the lung bases. Coronary artery calcifications. Hepatobiliary: No focal liver abnormality is seen. No gallstones, gallbladder wall thickening, or biliary dilatation. Pancreas: There is heterogeneous fullness in the head of  the pancreas with stranding around the peripancreatic fat and mild pancreatic ductal dilatation. This could represent focal inflammation or underlying mass lesion. Spleen: Normal in size without focal abnormality. Adrenals/Urinary Tract: No adrenal gland nodules. 8 mm stone in the midportion right kidney. 5 mm stone in the lower pole left kidney. Additional tiny punctate stones in both kidneys. Benign-appearing cyst in the lower pole right kidney. No hydronephrosis or hydroureter. No ureteral stones identified. Bladder wall is not thickened. No bladder stones. Stomach/Bowel: Stomach, small bowel, and colon are not abnormally distended. No inflammatory changes or wall thickening identified. Appendix is not identified. Vascular/Lymphatic: Aortic atherosclerosis. No enlarged abdominal or pelvic lymph nodes. Reproductive: Prostate is unremarkable. Other: No abdominal wall hernia or abnormality. No abdominopelvic ascites. Musculoskeletal: Degenerative changes in  the spine. Bridging anterior osteophytes in the lower thoracic and upper lumbar region. No destructive bone lesions. IMPRESSION: 1. Fullness in the head of the pancreas with stranding in the peripancreatic fat and pancreatic ductal dilatation. Consider focal acute pancreatitis versus underlying mass lesion. Laboratory correlation is recommended. Consider follow-up with elective MRI/MRCP to exclude a pancreatic head mass. 2. Multiple bilateral nonobstructing intrarenal stones. No ureteral stone or obstruction. 3. Emphysematous changes and atelectasis in the lung bases. 4. Aortic atherosclerosis. Electronically Signed   By: Lucienne Capers M.D.   On: 08/03/2017 23:58    Procedures Procedures (including critical care time)  Medications Ordered in ED Medications  morphine 4 MG/ML injection 4 mg (4 mg Intravenous Given 08/03/17 2303)  ondansetron (ZOFRAN) injection 4 mg (4 mg Intravenous Given 08/03/17 2303)  iopamidol (ISOVUE-300) 61 % injection (100 mLs   Contrast Given 08/03/17 2333)     Initial Impression / Assessment and Plan / ED Course  I have reviewed the triage vital signs and the nursing notes.  Pertinent labs & imaging results that were available during my care of the patient were reviewed by me and considered in my medical decision making (see chart for details).     Patient presenting with complaint of abdominal pain which has been intermittent and worsening.  Tonight pain was in his upper abdomen and up into his chest.  He also is concerned about hypertension.  2 sets of troponin were negative.  His EKG was reassuring.  Doubt ACS.  Due to his abdominal pain CT scan was obtained which shows possible pancreatitis.  Awaiting LFTs and lipase for clinical correlation.  Patient feels improved after morphine in the ED.  Will discharge with pain control and antiemetics.  Patient has follow-up with GI as scheduled for next week.  And may need nonemergent MR of the abdomen to further evaluate his pancreas as per radiology recommendations.  Final Clinical Impressions(s) / ED Diagnoses   Final diagnoses:  Abdominal pain, unspecified abdominal location  Nonspecific chest pain  Essential hypertension    ED Discharge Orders    None       Mabe, Forbes Cellar, MD 08/04/17 [phone]

## 2017-08-03 NOTE — ED Triage Notes (Signed)
Pt presents to the ed with complaints of abdominal and chest pain that has been going on off and on x 3 weeks.  The patient reports this is the fifth time he has had this episode. His blood pressure was elevated this am. Endorses lower abdominal pain and centralized chest pain that is stabbing in nature. Denies any associated symptoms

## 2017-08-04 ENCOUNTER — Other Ambulatory Visit (HOSPITAL_COMMUNITY): Payer: BLUE CROSS/BLUE SHIELD

## 2017-08-04 ENCOUNTER — Inpatient Hospital Stay (HOSPITAL_COMMUNITY): Payer: BLUE CROSS/BLUE SHIELD

## 2017-08-04 ENCOUNTER — Encounter (HOSPITAL_COMMUNITY): Payer: Self-pay | Admitting: General Practice

## 2017-08-04 DIAGNOSIS — K851 Biliary acute pancreatitis without necrosis or infection: Secondary | ICD-10-CM | POA: Diagnosis present

## 2017-08-04 DIAGNOSIS — K859 Acute pancreatitis without necrosis or infection, unspecified: Secondary | ICD-10-CM | POA: Diagnosis not present

## 2017-08-04 DIAGNOSIS — I160 Hypertensive urgency: Secondary | ICD-10-CM | POA: Diagnosis present

## 2017-08-04 DIAGNOSIS — E785 Hyperlipidemia, unspecified: Secondary | ICD-10-CM | POA: Diagnosis present

## 2017-08-04 DIAGNOSIS — Z85828 Personal history of other malignant neoplasm of skin: Secondary | ICD-10-CM | POA: Diagnosis not present

## 2017-08-04 DIAGNOSIS — R634 Abnormal weight loss: Secondary | ICD-10-CM | POA: Diagnosis not present

## 2017-08-04 DIAGNOSIS — I77811 Abdominal aortic ectasia: Secondary | ICD-10-CM | POA: Diagnosis present

## 2017-08-04 DIAGNOSIS — D696 Thrombocytopenia, unspecified: Secondary | ICD-10-CM | POA: Diagnosis present

## 2017-08-04 DIAGNOSIS — N200 Calculus of kidney: Secondary | ICD-10-CM | POA: Diagnosis not present

## 2017-08-04 DIAGNOSIS — R933 Abnormal findings on diagnostic imaging of other parts of digestive tract: Secondary | ICD-10-CM

## 2017-08-04 DIAGNOSIS — K5900 Constipation, unspecified: Secondary | ICD-10-CM | POA: Diagnosis present

## 2017-08-04 DIAGNOSIS — I10 Essential (primary) hypertension: Secondary | ICD-10-CM | POA: Diagnosis not present

## 2017-08-04 DIAGNOSIS — C4491 Basal cell carcinoma of skin, unspecified: Secondary | ICD-10-CM

## 2017-08-04 DIAGNOSIS — Z79899 Other long term (current) drug therapy: Secondary | ICD-10-CM | POA: Diagnosis not present

## 2017-08-04 DIAGNOSIS — R109 Unspecified abdominal pain: Secondary | ICD-10-CM | POA: Diagnosis not present

## 2017-08-04 DIAGNOSIS — R945 Abnormal results of liver function studies: Secondary | ICD-10-CM | POA: Diagnosis not present

## 2017-08-04 DIAGNOSIS — R188 Other ascites: Secondary | ICD-10-CM | POA: Diagnosis not present

## 2017-08-04 DIAGNOSIS — R1900 Intra-abdominal and pelvic swelling, mass and lump, unspecified site: Secondary | ICD-10-CM | POA: Diagnosis present

## 2017-08-04 DIAGNOSIS — H9190 Unspecified hearing loss, unspecified ear: Secondary | ICD-10-CM | POA: Diagnosis present

## 2017-08-04 DIAGNOSIS — R160 Hepatomegaly, not elsewhere classified: Secondary | ICD-10-CM | POA: Diagnosis present

## 2017-08-04 DIAGNOSIS — R7989 Other specified abnormal findings of blood chemistry: Secondary | ICD-10-CM

## 2017-08-04 DIAGNOSIS — J439 Emphysema, unspecified: Secondary | ICD-10-CM | POA: Diagnosis present

## 2017-08-04 LAB — CBC
HCT: 38.6 % — ABNORMAL LOW (ref 39.0–52.0)
HEMOGLOBIN: 13.1 g/dL (ref 13.0–17.0)
MCH: 29.2 pg (ref 26.0–34.0)
MCHC: 33.9 g/dL (ref 30.0–36.0)
MCV: 86 fL (ref 78.0–100.0)
Platelets: 102 10*3/uL — ABNORMAL LOW (ref 150–400)
RBC: 4.49 MIL/uL (ref 4.22–5.81)
RDW: 12.6 % (ref 11.5–15.5)
WBC: 5.1 10*3/uL (ref 4.0–10.5)

## 2017-08-04 LAB — COMPREHENSIVE METABOLIC PANEL
ALBUMIN: 3.5 g/dL (ref 3.5–5.0)
ALK PHOS: 216 U/L — AB (ref 38–126)
ALT: 374 U/L — ABNORMAL HIGH (ref 17–63)
ANION GAP: 9 (ref 5–15)
AST: 81 U/L — ABNORMAL HIGH (ref 15–41)
BUN: 8 mg/dL (ref 6–20)
CALCIUM: 8.7 mg/dL — AB (ref 8.9–10.3)
CO2: 23 mmol/L (ref 22–32)
Chloride: 106 mmol/L (ref 101–111)
Creatinine, Ser: 0.89 mg/dL (ref 0.61–1.24)
GFR calc non Af Amer: >60 mL/min (ref >60)
Glucose, Bld: 107 mg/dL — ABNORMAL HIGH (ref 65–99)
Potassium: 3.8 mmol/L (ref 3.5–5.1)
SODIUM: 138 mmol/L (ref 135–145)
TOTAL PROTEIN: 5.7 g/dL — AB (ref 6.5–8.1)
Total Bilirubin: 2.9 mg/dL — ABNORMAL HIGH (ref 0.3–1.2)

## 2017-08-04 LAB — HEPATIC FUNCTION PANEL
ALT: 472 U/L — ABNORMAL HIGH (ref 17–63)
AST: 129 U/L — AB (ref 15–41)
Albumin: 4 g/dL (ref 3.5–5.0)
Alkaline Phosphatase: 252 U/L — ABNORMAL HIGH (ref 38–126)
BILIRUBIN TOTAL: 2.9 mg/dL — AB (ref 0.3–1.2)
Bilirubin, Direct: 0.4 mg/dL (ref 0.1–0.5)
Indirect Bilirubin: 2.5 mg/dL — ABNORMAL HIGH (ref 0.3–0.9)
Total Protein: 6.5 g/dL (ref 6.5–8.1)

## 2017-08-04 LAB — LIPASE, BLOOD: Lipase: 900 U/L — ABNORMAL HIGH (ref 11–51)

## 2017-08-04 LAB — HIV ANTIBODY (ROUTINE TESTING W REFLEX): HIV SCREEN 4TH GENERATION: NONREACTIVE

## 2017-08-04 LAB — TRIGLYCERIDES: TRIGLYCERIDES: 74 mg/dL (ref <150)

## 2017-08-04 LAB — SEDIMENTATION RATE: Sed Rate: 8 mm/hr (ref 0–16)

## 2017-08-04 MED ORDER — LACTATED RINGERS IV SOLN
INTRAVENOUS | Status: DC
  Administered 2017-08-04 – 2017-08-05 (×4): via INTRAVENOUS

## 2017-08-04 MED ORDER — HYDROCODONE-ACETAMINOPHEN 5-325 MG PO TABS: 1.0000 | ORAL_TABLET | ORAL | 0 refills | Status: DC | PRN

## 2017-08-04 MED ORDER — POTASSIUM CHLORIDE 10 MEQ/100ML IV SOLN: 10.0000 meq | INTRAVENOUS | Status: DC

## 2017-08-04 MED ORDER — ONDANSETRON HCL 4 MG/2ML IJ SOLN: 4.0000 mg | Freq: Four times a day (QID) | INTRAMUSCULAR | Status: DC | PRN

## 2017-08-04 MED ORDER — MAGNESIUM HYDROXIDE 400 MG/5ML PO SUSP
30.0000 mL | Freq: Every day | ORAL | Status: DC
  Administered 2017-08-04: 30 mL via ORAL
  Filled 2017-08-04: qty 30

## 2017-08-04 MED ORDER — SODIUM CHLORIDE 0.9 % IV SOLN
INTRAVENOUS | Status: DC
  Administered 2017-08-04: 03:00:00 via INTRAVENOUS

## 2017-08-04 MED ORDER — HYDROMORPHONE HCL 1 MG/ML IJ SOLN
0.5000 mg | INTRAMUSCULAR | Status: DC | PRN
  Administered 2017-08-04 – 2017-08-05 (×2): 1 mg via INTRAVENOUS
  Filled 2017-08-04 (×2): qty 1

## 2017-08-04 MED ORDER — ACETAMINOPHEN 650 MG RE SUPP: 650.0000 mg | Freq: Four times a day (QID) | RECTAL | Status: DC | PRN

## 2017-08-04 MED ORDER — ONDANSETRON 4 MG PO TBDP: 4.0000 mg | ORAL_TABLET | Freq: Three times a day (TID) | ORAL | 0 refills | Status: DC | PRN

## 2017-08-04 MED ORDER — ACETAMINOPHEN 325 MG PO TABS
650.0000 mg | ORAL_TABLET | Freq: Four times a day (QID) | ORAL | Status: DC | PRN
  Administered 2017-08-04: 650 mg via ORAL
  Filled 2017-08-04: qty 2

## 2017-08-04 MED ORDER — GADOBENATE DIMEGLUMINE 529 MG/ML IV SOLN
20.0000 mL | Freq: Once | INTRAVENOUS | Status: AC | PRN
  Administered 2017-08-04: 20 mL via INTRAVENOUS

## 2017-08-04 MED ORDER — HYDROXYZINE HCL 25 MG PO TABS: 25.0000 mg | ORAL_TABLET | Freq: Three times a day (TID) | ORAL | Status: DC | PRN

## 2017-08-04 MED ORDER — HYDROMORPHONE HCL 1 MG/ML IJ SOLN
1.0000 mg | Freq: Once | INTRAMUSCULAR | Status: AC
  Administered 2017-08-04: 1 mg via INTRAVENOUS
  Filled 2017-08-04: qty 1

## 2017-08-04 MED ORDER — SODIUM CHLORIDE 0.9 % IV BOLUS (SEPSIS)
1000.0000 mL | Freq: Once | INTRAVENOUS | Status: AC
  Administered 2017-08-04: 1000 mL via INTRAVENOUS

## 2017-08-04 MED ORDER — PANTOPRAZOLE SODIUM 40 MG IV SOLR
40.0000 mg | Freq: Once | INTRAVENOUS | Status: AC
  Administered 2017-08-04: 40 mg via INTRAVENOUS
  Filled 2017-08-04: qty 40

## 2017-08-04 MED ORDER — ONDANSETRON HCL 4 MG PO TABS: 4.0000 mg | ORAL_TABLET | Freq: Four times a day (QID) | ORAL | Status: DC | PRN

## 2017-08-04 MED ORDER — HYDROCHLOROTHIAZIDE 25 MG PO TABS: 25.0000 mg | ORAL_TABLET | Freq: Every day | ORAL | 0 refills | Status: DC

## 2017-08-04 MED ORDER — SODIUM CHLORIDE 0.9 % IV SOLN: INTRAVENOUS | Status: DC

## 2017-08-04 MED ORDER — IPRATROPIUM-ALBUTEROL 0.5-2.5 (3) MG/3ML IN SOLN: 3.0000 mL | Freq: Four times a day (QID) | RESPIRATORY_TRACT | Status: DC | PRN

## 2017-08-04 MED ORDER — ENOXAPARIN SODIUM 40 MG/0.4ML ~~LOC~~ SOLN
40.0000 mg | SUBCUTANEOUS | Status: DC
  Administered 2017-08-04: 40 mg via SUBCUTANEOUS
  Filled 2017-08-04 (×2): qty 0.4

## 2017-08-04 MED ORDER — LOSARTAN POTASSIUM 50 MG PO TABS
25.0000 mg | ORAL_TABLET | Freq: Every day | ORAL | Status: DC
  Administered 2017-08-04: 25 mg via ORAL
  Filled 2017-08-04 (×2): qty 1

## 2017-08-04 MED ORDER — HYDRALAZINE HCL 20 MG/ML IJ SOLN: 10.0000 mg | INTRAMUSCULAR | Status: DC | PRN

## 2017-08-04 NOTE — Consult Note (Signed)
[facility] Consult: 9:41 AM 08/04/2017  LOS: 0 days    Referring Provider: Dr Olevia Bowens  Primary Care Physician:  Mancel Bale, PA-C at [facility] drive and at [facility].   Primary Gastroenterologist:  Delfin Edis, 2007 >> Dr Loletha Carrow as of 07/2017.       Reason for Consultation:  pancreatitis   HPI: Cory Kelly is a 62 y.o. male.  Hx HTN.  Basal cell cancer left lower back, upper chest,  2016.  Kidney stones.  S/p appendectomy.  Partial hearing loss.   Emphysema per imaging.  Hyperlipidemia.  S/p benign prostate biopsies 01/2012.   Colonoscopy 12/2006, screening study:  TA polyps at 60 and 120 cm.   Did not respond to colonoscopy call back letters 2013 and 2014.   PMD referral for colonoscopy received ~ 3 days ago.  Set up for 2/5 colonoscopy with Dr Loletha Carrow.  Attacks of pain in abdomen with bloating and constipation, malaise.  Started ~ 1 month ago, lasted less than 24 hours.  Pain located at waist bil, more recently some radiation into upper abdomen.  He tends to eat a large amount of fat in his diet but cannot confirm that the attacks were triggered by particularly fatty meals.  Back pain separate from abd pain is long standing.  Sxs seemed worse after taking Metamucil.  Initiated daily Miralax, Zantac, gas-ex.  Takes frequent but not excessive Aleve/ibuprofen for back pain.   No N/V.  The attacks have worsened and current attack present for ~ 3 days. The above colonoscopy was arranged by PA at [facility].  Yesterday BP 190/120 so Cardizem was RXd.   Wt down about 20 # in 2 to 3 months.  During intervals between attacks, still having constipation and smaller volume and caliber stools despite Miralax.  No bloody stool.     Came to [facility] ED yesterday.   Pain not aggrevated by palpation.  Received Morphin 4  mg and Dilaudid 1 mg last night and nearly pain free since.    Lipase 900.  T bili 2.9 >> 2.9, alk phos 252 >> 216, AST/ALT 129/47 >> 81/374.  Normal renal fx.  WBCs, Hgb normal.  Platelets 120 >> 102.   CT scan: fullness at [facility] with associated stranding, pancreatitis and/or underlying mass.  Mild PD dilation.  GB normal.  Normal spleen.  MRI/MRCP: fullness at HOP/uncinate process, favoring acute on chronic pancreatitis.  Mild diffuse PD dilatation.  Pancreas divisum.  68m CBD with narrowing in region of pancreatic head.  Unable to exclude concurrent adenocarcinoma.  TSTC lesion right hepatic lobe. Small volume peri-hepatic ascites.  Splenomegaly.    Ultrasound: GB sludge.  Splenomegaly.  Ectatic abdominal aorta.  Nephrolithiasis.          No ETOH.   Fm Hx colon ca in maternal uncle in his late 563s   Basketball cLeisure centre manager aScientist, [employer]at [employer]     Past Medical History:  Diagnosis Date  . Abdominal pain    due to bloating  . Bloating   . Cancer (  [place])    skin cancer  . Fatigue   . History of weight loss   . HOH (hard of hearing)    no hearing aids  . Stress    loss of father in Dec 2018.    Past Surgical History:  Procedure Laterality Date  . APPENDECTOMY    . EYE SURGERY     lasik/left eye  . SKIN CANCER EXCISION      Prior to Admission medications   Medication Sig Start Date End Date Taking? Authorizing Provider  losartan (COZAAR) 25 MG tablet Take 25 mg by mouth daily.   Yes [provider]  magnesium 30 MG tablet Take 30 mg by mouth at bedtime.    Yes [provider]  Polyethylene Glycol 3350 (MIRALAX PO) Take 1 Applicatorful by mouth at bedtime. Take 1 capful daily    Yes [provider]  ranitidine (ZANTAC) 150 MG tablet Take 150 mg by mouth 2 (two) times daily.   Yes [provider]  Simethicone (GAS-X PO) Take 2 tablets by mouth 2 (two) times daily. Take 2 pills daily    Yes [provider]    hydrochlorothiazide (HYDRODIURIL) 25 MG tablet Take 1 tablet (25 mg total) by mouth daily. 08/04/17   Mabe, Forbes Cellar, MD  HYDROcodone-acetaminophen (NORCO/VICODIN) 5-325 MG tablet Take 1 tablet by mouth every 4 (four) hours as needed. 08/04/17   Mabe, Forbes Cellar, MD  ondansetron (ZOFRAN ODT) 4 MG disintegrating tablet Take 1 tablet (4 mg total) by mouth every 8 (eight) hours as needed. 08/04/17   Mabe, Forbes Cellar, MD    Scheduled Meds: . enoxaparin (LOVENOX) injection  40 mg Subcutaneous Q24H   Infusions: . sodium chloride     PRN Meds: acetaminophen **OR** acetaminophen, hydrALAZINE, HYDROmorphone (DILAUDID) injection, hydrOXYzine, ipratropium-albuterol, ondansetron **OR** ondansetron (ZOFRAN) IV   Allergies as of 08/03/2017  . (No Known Allergies)    Family History  Problem Relation Age of Onset  . Hyperlipidemia Father   . Heart disease Father   . Dementia Father   . Memory loss Mother     Social History   Socioeconomic History  . Marital status: Married    Spouse name: Not on file  . Number of children: Not on file  . Years of education: Not on file  . Highest education level: Not on file  Social Needs  . Financial resource strain: Not on file  . Food insecurity - worry: Not on file  . Food insecurity - inability: Not on file  . Transportation needs - medical: Not on file  . Transportation needs - non-medical: Not on file  Occupational History    Employer: [employer]  Tobacco Use  . Smoking status: Never Smoker  . Smokeless tobacco: Never Used  Substance and Sexual Activity  . Alcohol use: No  . Drug use: No  . Sexual activity: Yes    Birth control/protection: None  Other Topics Concern  . Not on file  Social History Narrative   Married   Sports administrator womens basketball at [employer]: Constitutional:  No weakness or fatigue.  Tired as not sleeping well the last few nights ENT:  No nose bleeds Pulm:  No SOB, no cough CV:  No  palpitations, no LE edema.  GU:  No hematuria, no frequency GI:  Per HPI Heme:  No unusual bleeding or bruising   Transfusions:  none Neuro:  No headaches, no peripheral tingling or numbness Derm:  No itching, no rash or sores.  Endocrine:  No sweats or chills.  No polyuria or dysuria Immunization:  T dap 2017.   Travel:  None beyond local counties in last few months.    PHYSICAL EXAM: Vital signs in last 24 hours: Vitals:   08/04/17 0645 08/04/17 0700  BP: (!) 161/94 (!) 149/91  Pulse: 61 (!) 52  Resp: 14 14  Temp:    SpO2: 95% 92%   Wt Readings from Last 3 Encounters:  08/02/17 110.3 kg (243 lb 3.2 oz)  06/10/16 115.8 kg (255 lb 6.4 oz)  10/27/15 115.5 kg (254 lb 9.6 oz)   General: Healthy looking WF.  Currently comfortable. Head: No facial asymmetry or swelling Eyes: No scleral icterus.  No conjunctival pallor.  EOMI. Ears: No obvious hearing deficit. Nose: No congestion or discharge. Mouth: Oropharynx moist, clear.  Good dentition.  Tongue midline. Neck: No JVD, no thyromegaly, no masses, no bruits. Lungs: Clear bilaterally.  No labored breathing or cough. Heart: RRR.  No MRG.  S1, S2 present Abdomen: Nondistended, soft.  Not tender.  Active bowel sounds.  No masses, HSM, hernias, bruits   Rectal: Deferred rectal exam Musc/Skeltl: No joint redness, swelling or significant deformity. Extremities: No CCE. Neurologic: Alert.  Oriented x3.  No limb weakness.  No tremor. Skin: No rash, no sores, no jaundice, no telangiectasia. Tattoos: None observed Nodes: No cervical adenopathy. Psych: Cooperative, pleasant, calm.  Fluid speech.  Intake/Output from previous day: 01/31 0701 - 02/01 0700 In: 1000 [IV Piggyback:1000] Out: -  Intake/Output this shift: No intake/output data recorded.  LAB RESULTS: Recent Labs    08/03/17 1910 08/04/17 0256  WBC 6.0 5.1  HGB 14.7 13.1  HCT 42.0 38.6*  PLT 120* 102*   BMET Lab Results  Component Value Date   NA 138  08/04/2017   NA 138 08/03/2017   NA 138 10/27/2015   K 3.8 08/04/2017   K 3.6 08/03/2017   K 3.7 10/27/2015   CL 106 08/04/2017   CL 104 08/03/2017   CL 97 (L) 10/27/2015   CO2 23 08/04/2017   CO2 23 08/03/2017   CO2 29 10/27/2015   GLUCOSE 107 (H) 08/04/2017   GLUCOSE 109 (H) 08/03/2017   GLUCOSE 89 10/27/2015   BUN 8 08/04/2017   BUN 8 08/03/2017   BUN 15 10/27/2015   CREATININE 0.89 08/04/2017   CREATININE 0.96 08/03/2017   CREATININE 1.27 10/27/2015   CALCIUM 8.7 (L) 08/04/2017   CALCIUM 8.9 08/03/2017   CALCIUM 9.6 10/27/2015   LFT Recent Labs    08/03/17 1910 08/04/17 0256  PROT 6.5 5.7*  ALBUMIN 4.0 3.5  AST 129* 81*  ALT 472* 374*  ALKPHOS 252* 216*  BILITOT 2.9* 2.9*  BILIDIR 0.4  --   IBILI 2.5*  --    PT/INR No results found for: INR, PROTIME Hepatitis Panel No results for input(s): HEPBSAG, HCVAB, HEPAIGM, HEPBIGM in the last 72 hours. C-Diff No components found for: CDIFF Lipase     Component Value Date/Time   LIPASE 900 (H) 08/03/2017 1910    Drugs of Abuse  No results found for: LABOPIA, COCAINSCRNUR, LABBENZ, AMPHETMU, THCU, LABBARB   RADIOLOGY STUDIES: Dg Chest 2 View  Result Date: 08/03/2017 CLINICAL DATA:  Central chest pain for 3 weeks. EXAM: CHEST  2 VIEW COMPARISON:  February 21, 2007 FINDINGS: The heart size and mediastinal contours are within normal limits. Both lungs are clear. The visualized skeletal structures are unremarkable. IMPRESSION: No active cardiopulmonary  disease. Electronically Signed   By: Dorise Bullion III M.D   On: 08/03/2017 19:30   Ct Abdomen Pelvis W Contrast  Result Date: 08/03/2017 CLINICAL DATA:  Lower abdominal pain, back pain, and constipation for 1 month. EXAM: CT ABDOMEN AND PELVIS WITH CONTRAST TECHNIQUE: Multidetector CT imaging of the abdomen and pelvis was performed using the standard protocol following bolus administration of intravenous contrast. CONTRAST:  100 mL Isovue-300 COMPARISON:  02/10/2007  FINDINGS: Lower chest: Emphysematous changes and atelectasis in the lung bases. Coronary artery calcifications. Hepatobiliary: No focal liver abnormality is seen. No gallstones, gallbladder wall thickening, or biliary dilatation. Pancreas: There is heterogeneous fullness in the head of the pancreas with stranding around the peripancreatic fat and mild pancreatic ductal dilatation. This could represent focal inflammation or underlying mass lesion. Spleen: Normal in size without focal abnormality. Adrenals/Urinary Tract: No adrenal gland nodules. 8 mm stone in the midportion right kidney. 5 mm stone in the lower pole left kidney. Additional tiny punctate stones in both kidneys. Benign-appearing cyst in the lower pole right kidney. No hydronephrosis or hydroureter. No ureteral stones identified. Bladder wall is not thickened. No bladder stones. Stomach/Bowel: Stomach, small bowel, and colon are not abnormally distended. No inflammatory changes or wall thickening identified. Appendix is not identified. Vascular/Lymphatic: Aortic atherosclerosis. No enlarged abdominal or pelvic lymph nodes. Reproductive: Prostate is unremarkable. Other: No abdominal wall hernia or abnormality. No abdominopelvic ascites. Musculoskeletal: Degenerative changes in the spine. Bridging anterior osteophytes in the lower thoracic and upper lumbar region. No destructive bone lesions. IMPRESSION: 1. Fullness in the head of the pancreas with stranding in the peripancreatic fat and pancreatic ductal dilatation. Consider focal acute pancreatitis versus underlying mass lesion. Laboratory correlation is recommended. Consider follow-up with elective MRI/MRCP to exclude a pancreatic head mass. 2. Multiple bilateral nonobstructing intrarenal stones. No ureteral stone or obstruction. 3. Emphysematous changes and atelectasis in the lung bases. 4. Aortic atherosclerosis. Electronically Signed   By: Lucienne Capers M.D.   On: 08/03/2017 23:58   Mr Abdomen  With Mrcp W Contrast  Result Date: 08/04/2017 CLINICAL DATA:  Bloatedness and constipation for the last 2 days. Lower abdominal pain. Abnormal CT demonstrating probable pancreatitis. EXAM: MRI ABDOMEN WITHOUT CONTRAST  (INCLUDING MRCP) TECHNIQUE: Multiplanar multisequence MR imaging of the abdomen was performed. Heavily T2-weighted images of the biliary and pancreatic ducts were obtained, and three-dimensional MRCP images were rendered by post processing. COMPARISON:  CT of 1 day prior and 02/10/2007. FINDINGS: Portions of exam, especially the MRCP dedicated series, are motion degraded. Lower chest: Mild cardiomegaly, without pericardial or pleural effusion. Hepatobiliary: A 6 mm right liver lobe too small to characterize lesion image 56/series 1412. Intrahepatic ducts are upper normal. Normal gallbladder. The common duct measures 8 mm on image 23/series 5, upper normal for patient age. No choledocholithiasis identified. The common duct is followed to the level of the pancreatic head, where it undergoes a transition to decompressed, felt to be just above the ampulla. Example images 71 through 76/series 1401. Pancreas: Mild pancreatic duct dilatation throughout the body and tail with duct irregularity. Example image 28/series 6. This duct dilatation is followed to the level of an accessory entrance into the duodenum, including image 31/series 6. Within the pancreatic head and uncinate process is an equivocal area of soft tissue fullness, including at on the order of 2.4 cm on image 80/series 1403. This area does demonstrate restricted diffusion on image 53/series 8, but maintains normal precontrast T1 hyperintensity, including on image 86/series 1400. Moderate  peripancreatic edema surrounds the head and uncinate process. Example image 32/series 7. No peripancreatic fluid collection. Spleen:  Splenomegaly, 17.8 cm craniocaudal. Adrenals/Urinary Tract: Normal adrenal glands. Normal left kidney. Lower pole right renal  2.8 cm cyst. No hydronephrosis. Stomach/Bowel: Normal stomach and abdominal bowel loops. Vascular/Lymphatic: Normal caliber of the aorta and branch vessels. Patent splenic and portal veins. No retroperitoneal or retrocrural adenopathy. Other:  Small volume perihepatic ascites. Musculoskeletal: No acute osseous abnormality. IMPRESSION: 1. Mildly motion degraded exam. 2. Findings which are favored to represent acute on chronic pancreatitis. Peripancreatic edema surrounding the head and uncinate process, superimposed upon mild diffuse pancreatic duct dilatation. Pancreas divisum. 3. Equivocal soft tissue fullness within the head and uncinate process of the pancreas. Suggestion of common duct narrowing in this region. Therefore, concurrent adenocarcinoma canal be excluded. Potential clinical strategies include medical therapy with repeat pre and post contrast abdominal MRI/MRCP at 3-4 weeks versus further characterization with endoscopic ultrasound with special attention to the head and uncinate process. 4. Indeterminate too small to characterize right hepatic lobe lesion. Recommend attention on follow-up. 5. Small volume abdominal ascites. 6. Splenomegaly. Electronically Signed   By: Abigail Miyamoto M.D.   On: 08/04/2017 07:27     IMPRESSION:   *   Acute pancreatitis.  Progressive attacks of pain began 1 month ago.     By imaging, unable to r/o underlying mass.  Pancreas divisum.  GB sludge seen only on ultrasound.    *  Hx tubular adenoma colon polyp 2008.  Overdue for surveillance study.  Colonoscopy recently arranged for 08/08/17 with Dr Loletha Carrow.  Colon unremarkable on CT scan yesterday.  Constipation is ongoing issue despite daily MiraLAX.  *  Splenomegaly.  Non-critical thrombocytopenia.    *  Small, indeterminate lesion right hepatic lobe.    *  HTN.  Started Cardizem yesterday.  BP may be falsely elevated in setting of acute abdominal pain.     PLAN:     *   Supportive care.  Full liquid diet.   IVF, switch to lactated Ringers at 200 ml/hour.  Follow Lipase and CMET.   *  Will cancel colonoscopy appt for next week during this workup.  *  Going to give Laxative: milk of magnesia x 2 days.     Azucena Freed  08/04/2017, 9:41 AM Pager: [phone]    ________________________________________________________________________  Velora Heckler GI MD note:  I personally examined the patient, reviewed the data and agree with the assessment and plan described above.  This would be an unusual presentation for gallstone pancreatitis.  With lipase 900 however that may indeed be the case.  Also with lipase 900 I would expect that he at least has some component of of acute inflammation.  I do have some concern for pancreatic neoplasm given the CT/MR images and the fact that he's lost 20 pounds in past 1-2 months.  I explained to him that I would like to image with EUS in the next few weeks (delay by intention to allow some time for acute inflammation to resolve which gives optimal imaging/FNA ability by EUS, currently thinking feb 15th for EUS).  I am asking general surgery to see him for their opinion RE gallstone (sludge + by [place]). He is hungry and really not very tender so I am allowing low fat diet for now. Will follow along.   Owens Loffler, MD [facility] Pager [phone]

## 2017-08-04 NOTE — Discharge Instructions (Signed)
Return to the ED with any concerns including recurrent chest pain, difficulty breathing, vomiting and not able to keep down liquids, decreased level of alertness/lethargy, or any other alarming symptoms   If symptoms persist, you should discuss having an MRI of your abdomen obtained on a nonemergent basis.

## 2017-08-04 NOTE — Progress Notes (Signed)
1138 Received pt from ED. Pt alert and oriented. Accompanied by family. Resting comfortably in bed.

## 2017-08-04 NOTE — Progress Notes (Signed)
[facility] PROGRESS NOTE    Progress Note  Cory Kelly  CHE:[id number] DOB: [birth year] DOA: 08/03/2017 PCP: Mancel Bale, PA-C     Brief Narrative:   Cory Kelly is an 62 y.o. male past medical history of essential hypertension, basal cell carcinoma hard of hearing who comes into the hospital complaining of abdominal discomfort over the last several months intermittently, accompanied by constipation feeling of being bloated that usually last 1 or 2 days and then resolves, as the days progressed his abdominal pain got worse with radiation to the back, he also relates about a 15-20 pound weight loss unintentional in the last 30 days in the ED he was found to have a lipase of 900, AST 129 ALT 475 total bilirubin of 2.9 alkaline phosphatase of 252.  CT scan of the abdomen and pelvis showed fullness of the head of the pancreas with stranding and ductal dilation  Assessment/Plan:   Acute pancreatitis/Elevated LFTs: Patient denies any alcohol abuse. Started on aggressive IV fluid hydration Place n.p.o. and IV narcotics for pain. MRCP showed showed a mild diffuse pancreatic ductal dilation. Check abdominal ultrasound to rule out stones, as his alkaline phosphatase was significantly elevated and his LFTs have improved from yesterday he could have also passed a stone, but none were seen on CT scan, check triglycerides. We will consult GI for possible endoscopic ultrasound of the pancreas with MRCP. Continue IV fluids at 100 cc an hour for an additional 24 hours  Essential hypertension Hold all of his antihypertensive medication use hydralazine IV as needed  History Basal cell adenocarcinoma  DVT prophylaxis: Lovenox Family Communication:wife Disposition Plan/Barrier to D/C: unable to determine Code Status:     Code Status Orders  (From admission, onward)        Start     Ordered   08/04/17 0213  Full code  Continuous     08/04/17 0214    Code Status History    Date  Active Date Inactive Code Status Order ID Comments User Context   This patient has a current code status but no historical code status.        IV Access:    Peripheral IV   Procedures and diagnostic studies:   Dg Chest 2 View  Result Date: 08/03/2017 CLINICAL DATA:  Central chest pain for 3 weeks. EXAM: CHEST  2 VIEW COMPARISON:  February 21, 2007 FINDINGS: The heart size and mediastinal contours are within normal limits. Both lungs are clear. The visualized skeletal structures are unremarkable. IMPRESSION: No active cardiopulmonary disease. Electronically Signed   By: Dorise Bullion III M.D   On: 08/03/2017 19:30   Ct Abdomen Pelvis W Contrast  Result Date: 08/03/2017 CLINICAL DATA:  Lower abdominal pain, back pain, and constipation for 1 month. EXAM: CT ABDOMEN AND PELVIS WITH CONTRAST TECHNIQUE: Multidetector CT imaging of the abdomen and pelvis was performed using the standard protocol following bolus administration of intravenous contrast. CONTRAST:  100 mL Isovue-300 COMPARISON:  02/10/2007 FINDINGS: Lower chest: Emphysematous changes and atelectasis in the lung bases. Coronary artery calcifications. Hepatobiliary: No focal liver abnormality is seen. No gallstones, gallbladder wall thickening, or biliary dilatation. Pancreas: There is heterogeneous fullness in the head of the pancreas with stranding around the peripancreatic fat and mild pancreatic ductal dilatation. This could represent focal inflammation or underlying mass lesion. Spleen: Normal in size without focal abnormality. Adrenals/Urinary Tract: No adrenal gland nodules. 8 mm stone in the midportion right kidney. 5 mm stone in the lower pole left  kidney. Additional tiny punctate stones in both kidneys. Benign-appearing cyst in the lower pole right kidney. No hydronephrosis or hydroureter. No ureteral stones identified. Bladder wall is not thickened. No bladder stones. Stomach/Bowel: Stomach, small bowel, and colon are not  abnormally distended. No inflammatory changes or wall thickening identified. Appendix is not identified. Vascular/Lymphatic: Aortic atherosclerosis. No enlarged abdominal or pelvic lymph nodes. Reproductive: Prostate is unremarkable. Other: No abdominal wall hernia or abnormality. No abdominopelvic ascites. Musculoskeletal: Degenerative changes in the spine. Bridging anterior osteophytes in the lower thoracic and upper lumbar region. No destructive bone lesions. IMPRESSION: 1. Fullness in the head of the pancreas with stranding in the peripancreatic fat and pancreatic ductal dilatation. Consider focal acute pancreatitis versus underlying mass lesion. Laboratory correlation is recommended. Consider follow-up with elective MRI/MRCP to exclude a pancreatic head mass. 2. Multiple bilateral nonobstructing intrarenal stones. No ureteral stone or obstruction. 3. Emphysematous changes and atelectasis in the lung bases. 4. Aortic atherosclerosis. Electronically Signed   By: Lucienne Capers M.D.   On: 08/03/2017 23:58   Mr Abdomen With Mrcp W Contrast  Result Date: 08/04/2017 CLINICAL DATA:  Bloatedness and constipation for the last 2 days. Lower abdominal pain. Abnormal CT demonstrating probable pancreatitis. EXAM: MRI ABDOMEN WITHOUT CONTRAST  (INCLUDING MRCP) TECHNIQUE: Multiplanar multisequence MR imaging of the abdomen was performed. Heavily T2-weighted images of the biliary and pancreatic ducts were obtained, and three-dimensional MRCP images were rendered by post processing. COMPARISON:  CT of 1 day prior and 02/10/2007. FINDINGS: Portions of exam, especially the MRCP dedicated series, are motion degraded. Lower chest: Mild cardiomegaly, without pericardial or pleural effusion. Hepatobiliary: A 6 mm right liver lobe too small to characterize lesion image 56/series 1412. Intrahepatic ducts are upper normal. Normal gallbladder. The common duct measures 8 mm on image 23/series 5, upper normal for patient age. No  choledocholithiasis identified. The common duct is followed to the level of the pancreatic head, where it undergoes a transition to decompressed, felt to be just above the ampulla. Example images 71 through 76/series 1401. Pancreas: Mild pancreatic duct dilatation throughout the body and tail with duct irregularity. Example image 28/series 6. This duct dilatation is followed to the level of an accessory entrance into the duodenum, including image 31/series 6. Within the pancreatic head and uncinate process is an equivocal area of soft tissue fullness, including at on the order of 2.4 cm on image 80/series 1403. This area does demonstrate restricted diffusion on image 53/series 8, but maintains normal precontrast T1 hyperintensity, including on image 86/series 1400. Moderate peripancreatic edema surrounds the head and uncinate process. Example image 32/series 7. No peripancreatic fluid collection. Spleen:  Splenomegaly, 17.8 cm craniocaudal. Adrenals/Urinary Tract: Normal adrenal glands. Normal left kidney. Lower pole right renal 2.8 cm cyst. No hydronephrosis. Stomach/Bowel: Normal stomach and abdominal bowel loops. Vascular/Lymphatic: Normal caliber of the aorta and branch vessels. Patent splenic and portal veins. No retroperitoneal or retrocrural adenopathy. Other:  Small volume perihepatic ascites. Musculoskeletal: No acute osseous abnormality. IMPRESSION: 1. Mildly motion degraded exam. 2. Findings which are favored to represent acute on chronic pancreatitis. Peripancreatic edema surrounding the head and uncinate process, superimposed upon mild diffuse pancreatic duct dilatation. Pancreas divisum. 3. Equivocal soft tissue fullness within the head and uncinate process of the pancreas. Suggestion of common duct narrowing in this region. Therefore, concurrent adenocarcinoma canal be excluded. Potential clinical strategies include medical therapy with repeat pre and post contrast abdominal MRI/MRCP at 3-4 weeks  versus further characterization with endoscopic ultrasound with special attention  to the head and uncinate process. 4. Indeterminate too small to characterize right hepatic lobe lesion. Recommend attention on follow-up. 5. Small volume abdominal ascites. 6. Splenomegaly. Electronically Signed   By: Abigail Miyamoto M.D.   On: 08/04/2017 07:27     Medical Consultants:    None.  Anti-Infectives:   none  Subjective:    Cory Kelly he relates no further nausea or vomiting, his abdominal pain is controlled  Objective:    Vitals:   08/04/17 0500 08/04/17 0641 08/04/17 0645 08/04/17 0700  BP: 122/68 (!) 161/94 (!) 161/94 (!) 149/91  Pulse: (!) 54 (!) 53 61 (!) 52  Resp: 14  14 14   Temp:      TempSrc:      SpO2: 96% 90% 95% 92%    Intake/Output Summary (Last 24 hours) at 08/04/2017 0910 Last data filed at 08/04/2017 0355 Gross per 24 hour  Intake 1000 ml  Output -  Net 1000 ml   There were no vitals filed for this visit.  Exam: General exam: In no acute distress. Respiratory system: Good air movement and clear to auscultation. Cardiovascular system: S1 & S2 heard, RRR.  Gastrointestinal system: Abdomen is nondistended, soft and nontender.  Central nervous system: Alert and oriented. No focal neurological deficits. Extremities: No pedal edema. Skin: No rashes, lesions or ulcers   Data Reviewed:    Labs: Basic Metabolic Panel: Recent Labs  Lab 08/03/17 1910 08/04/17 0256  NA 138 138  K 3.6 3.8  CL 104 106  CO2 23 23  GLUCOSE 109* 107*  BUN 8 8  CREATININE 0.96 0.89  CALCIUM 8.9 8.7*   GFR Estimated Creatinine Clearance: 121 mL/min (by C-G formula based on SCr of 0.89 mg/dL). Liver Function Tests: Recent Labs  Lab 08/03/17 1910 08/04/17 0256  AST 129* 81*  ALT 472* 374*  ALKPHOS 252* 216*  BILITOT 2.9* 2.9*  PROT 6.5 5.7*  ALBUMIN 4.0 3.5   Recent Labs  Lab 08/03/17 1910  LIPASE 900*   No results for input(s): AMMONIA in the last 168  hours. Coagulation profile No results for input(s): INR, PROTIME in the last 168 hours.  CBC: Recent Labs  Lab 08/03/17 1910 08/04/17 0256  WBC 6.0 5.1  HGB 14.7 13.1  HCT 42.0 38.6*  MCV 86.2 86.0  PLT 120* 102*   Cardiac Enzymes: No results for input(s): CKTOTAL, CKMB, CKMBINDEX, TROPONINI in the last 168 hours. BNP (last 3 results) No results for input(s): PROBNP in the last 8760 hours. CBG: No results for input(s): GLUCAP in the last 168 hours. D-Dimer: No results for input(s): DDIMER in the last 72 hours. Hgb A1c: No results for input(s): HGBA1C in the last 72 hours. Lipid Profile: No results for input(s): CHOL, HDL, LDLCALC, TRIG, CHOLHDL, LDLDIRECT in the last 72 hours. Thyroid function studies: No results for input(s): TSH, T4TOTAL, T3FREE, THYROIDAB in the last 72 hours.  Invalid input(s): FREET3 Anemia work up: No results for input(s): VITAMINB12, FOLATE, FERRITIN, TIBC, IRON, RETICCTPCT in the last 72 hours. Sepsis Labs: Recent Labs  Lab 08/03/17 1910 08/04/17 0256  WBC 6.0 5.1   Microbiology No results found for this or any previous visit (from the past 240 hour(s)).   Medications:   . enoxaparin (LOVENOX) injection  40 mg Subcutaneous Q24H   Continuous Infusions: . sodium chloride 150 mL/hr at 08/04/17 0642      LOS: 0 days   Charlynne Cousins  [facility] Pager [phone]  *Please refer to [email or web address], password  [email or web address] to get updated schedule on who will round on this patient, as hospitalists switch teams weekly. If 7PM-7AM, please contact night-coverage at [email or web address], password [email or web address] for any overnight needs.  08/04/2017, 9:10 AM

## 2017-08-04 NOTE — ED Notes (Signed)
Patient transported to Ultrasound 

## 2017-08-04 NOTE — Consult Note (Signed)
[facility] Surgery Consult/Admission Note  Cory Kelly 1955-10-31  [id number].    Requesting MD: Dr. Ardis Hughs Chief Complaint/Reason for Consult: pancreatitis  HPI:   Pt is a 62 year old male with a history of BCC of left lower back and upper chest, kidney stones, s/p appendectomy, partial hearing loss, who presented to the ED with complaints of abdominal pain, bloating, increased flatulence, and malaise that started about a month ago. Pt states he has had about 5 episodes of pain in the last month that lasted less than 24hr. Pain was located in the lower abdomen b/l, non radiating, sharp at times, nothing made it better. Associated lower back pain at times. Pt cannot recall if the pain began after eating and he is not aware of any aggravating factors. He eats a lot of fat in his meals but not sure if the pain was a result of eating. Over the last ~3 days the pain got worse and moved into the epigastric region. NO changes in bowel or bladder habits. He denies CP, SOB, fever, chills, nausea or vomiting. No blood in stools or melena. Of note: the pt states a ~20lb weight loss in the last 2 months although he contributes this to stress. His father recently passed and his mother has required increased help due to health issues. 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 Pt does not use ETOH. He is not anticoagulated.   Workup: Labs: Lipase 900, AST 81, ALT 374, Tbili 2.9, no WBC CT scan: fullness at [facility] with associated stranding, pancreatitis and/or underlying mass.  Mild PD dilation.  GB normal.  Normal spleen.  MRI/MRCP: fullness at HOP/uncinate process, favoring acute on chronic pancreatitis.  Mild diffuse PD dilatation.  Pancreas divisum.  65m CBD with narrowing in region of pancreatic head.  Unable to exclude concurrent adenocarcinoma.  TSTC lesion right hepatic lobe. Small volume peri-hepatic ascites.  Splenomegaly.    Ultrasound: GB sludge.  Splenomegaly.   Ectatic abdominal aorta.  Nephrolithiasis  ROS:  Review of Systems  Constitutional: Positive for malaise/fatigue and weight loss. Negative for chills, diaphoresis and fever.  HENT: Negative for sore throat.   Respiratory: Negative for cough and shortness of breath.   Cardiovascular: Negative for chest pain.  Gastrointestinal: Positive for abdominal pain. Negative for blood in stool, constipation, diarrhea, melena, nausea and vomiting.  Genitourinary: Negative for dysuria.  Musculoskeletal: Positive for back pain (lower ).  Skin: Negative for rash.  Neurological: Negative for dizziness and loss of consciousness.  All other systems reviewed and are negative.    Family History  Problem Relation Age of Onset  . Hyperlipidemia Father   . Heart disease Father   . Dementia Father   . Memory loss Mother     Past Medical History:  Diagnosis Date  . Abdominal pain    due to bloating  . Bloating   . Cancer ([place]    skin cancer  . Fatigue   . History of weight loss   . HOH (hard of hearing)    no hearing aids  . Stress    loss of father in Dec 2018.    Past Surgical History:  Procedure Laterality Date  . APPENDECTOMY    . EYE SURGERY     lasik/left eye  . SKIN CANCER EXCISION      Social History:  reports that  has never smoked. he has never used smokeless tobacco. He reports that he does not drink alcohol  or use drugs.  Allergies: No Known Allergies  Medications Prior to Admission  Medication Sig Dispense Refill  . losartan (COZAAR) 25 MG tablet Take 25 mg by mouth daily.    . magnesium 30 MG tablet Take 30 mg by mouth at bedtime.     . Polyethylene Glycol 3350 (MIRALAX PO) Take 1 Applicatorful by mouth at bedtime. Take 1 capful daily     . ranitidine (ZANTAC) 150 MG tablet Take 150 mg by mouth 2 (two) times daily.    . Simethicone (GAS-X PO) Take 2 tablets by mouth 2 (two) times daily. Take 2 pills daily       Blood pressure (!) 176/85, pulse 60, temperature 98.1  F (36.7 C), temperature source Oral, resp. rate 18, height '6\' 5"'$  (1.956 m), weight 239 lb 13.8 oz (108.8 kg), SpO2 100 %.  Physical Exam  Constitutional: He is oriented to person, place, and time and well-developed, well-nourished, and in no distress. No distress.  HENT:  Head: Normocephalic and atraumatic.  Nose: Nose normal.  Mouth/Throat: Mucous membranes are normal.  Eyes: Conjunctivae are normal. Right eye exhibits no discharge. Left eye exhibits no discharge. No scleral icterus.  Pupils are equal and round  Neck: Normal range of motion. Neck supple.  Cardiovascular: Normal rate, regular rhythm, normal heart sounds and intact distal pulses.  No murmur heard. Pulses:      Radial pulses are 2+ on the right side, and 2+ on the left side.  Pulmonary/Chest: Effort normal and breath sounds normal. No respiratory distress. He has no wheezes. He has no rhonchi. He has no rales.  Abdominal: Soft. Normal appearance and bowel sounds are normal. He exhibits no distension. There is no hepatomegaly. There is tenderness (very mild) in the right lower quadrant. There is no rigidity, no guarding, no tenderness at McBurney's point and negative Murphy's sign.  Musculoskeletal: Normal range of motion. He exhibits no edema, tenderness or deformity.  Neurological: He is alert and oriented to person, place, and time.  Skin: Skin is warm and dry. No rash noted. He is not diaphoretic.  Psychiatric: Mood and affect normal.    Results for orders placed or performed during the hospital encounter of 08/03/17 (from the past 48 hour(s))  Basic metabolic panel     Status: Abnormal   Collection Time: 08/03/17  7:10 PM  Result Value Ref Range   Sodium 138 135 - 145 mmol/L   Potassium 3.6 3.5 - 5.1 mmol/L   Chloride 104 101 - 111 mmol/L   CO2 23 22 - 32 mmol/L   Glucose, Bld 109 (H) 65 - 99 mg/dL   BUN 8 6 - 20 mg/dL   Creatinine, Ser 0.96 0.61 - 1.24 mg/dL   Calcium 8.9 8.9 - 10.3 mg/dL   GFR calc non Af  Amer >60 >60 mL/min   GFR calc Af Amer >60 >60 mL/min    Comment: (NOTE) The eGFR has been calculated using the CKD EPI equation. This calculation has not been validated in all clinical situations. eGFR's persistently <60 mL/min signify possible Chronic Kidney Disease.    Anion gap 11 5 - 15  CBC     Status: Abnormal   Collection Time: 08/03/17  7:10 PM  Result Value Ref Range   WBC 6.0 4.0 - 10.5 K/uL   RBC 4.87 4.22 - 5.81 MIL/uL   Hemoglobin 14.7 13.0 - 17.0 g/dL   HCT 42.0 39.0 - 52.0 %   MCV 86.2 78.0 - 100.0 fL  MCH 30.2 26.0 - 34.0 pg   MCHC 35.0 30.0 - 36.0 g/dL   RDW 12.9 11.5 - 15.5 %   Platelets 120 (L) 150 - 400 K/uL  Hepatic function panel     Status: Abnormal   Collection Time: 08/03/17  7:10 PM  Result Value Ref Range   Total Protein 6.5 6.5 - 8.1 g/dL   Albumin 4.0 3.5 - 5.0 g/dL   AST 129 (H) 15 - 41 U/L   ALT 472 (H) 17 - 63 U/L   Alkaline Phosphatase 252 (H) 38 - 126 U/L   Total Bilirubin 2.9 (H) 0.3 - 1.2 mg/dL   Bilirubin, Direct 0.4 0.1 - 0.5 mg/dL   Indirect Bilirubin 2.5 (H) 0.3 - 0.9 mg/dL  Lipase, blood     Status: Abnormal   Collection Time: 08/03/17  7:10 PM  Result Value Ref Range   Lipase 900 (H) 11 - 51 U/L    Comment: RESULTS CONFIRMED BY MANUAL DILUTION  I-stat troponin, ED     Status: None   Collection Time: 08/03/17  7:53 PM  Result Value Ref Range   Troponin i, poc 0.01 0.00 - 0.08 ng/mL   Comment 3            Comment: Due to the release kinetics of cTnI, a negative result within the first hours of the onset of symptoms does not rule out myocardial infarction with certainty. If myocardial infarction is still suspected, repeat the test at appropriate intervals.   I-Stat Troponin, ED (not at [facility])     Status: None   Collection Time: 08/03/17 11:54 PM  Result Value Ref Range   Troponin i, poc 0.01 0.00 - 0.08 ng/mL   Comment 3            Comment: Due to the release kinetics of cTnI, a negative result within the first hours of  the onset of symptoms does not rule out myocardial infarction with certainty. If myocardial infarction is still suspected, repeat the test at appropriate intervals.   Triglycerides     Status: None   Collection Time: 08/04/17  2:11 AM  Result Value Ref Range   Triglycerides 74 <150 mg/dL    Comment: Performed at [facility] [address], [place], [place] [address]  CBC     Status: Abnormal   Collection Time: 08/04/17  2:56 AM  Result Value Ref Range   WBC 5.1 4.0 - 10.5 K/uL   RBC 4.49 4.22 - 5.81 MIL/uL   Hemoglobin 13.1 13.0 - 17.0 g/dL   HCT 38.6 (L) 39.0 - 52.0 %   MCV 86.0 78.0 - 100.0 fL   MCH 29.2 26.0 - 34.0 pg   MCHC 33.9 30.0 - 36.0 g/dL   RDW 12.6 11.5 - 15.5 %   Platelets 102 (L) 150 - 400 K/uL    Comment: REPEATED TO VERIFY SPECIMEN CHECKED FOR CLOTS PLATELET COUNT CONFIRMED BY SMEAR   Comprehensive metabolic panel     Status: Abnormal   Collection Time: 08/04/17  2:56 AM  Result Value Ref Range   Sodium 138 135 - 145 mmol/L   Potassium 3.8 3.5 - 5.1 mmol/L   Chloride 106 101 - 111 mmol/L   CO2 23 22 - 32 mmol/L   Glucose, Bld 107 (H) 65 - 99 mg/dL   BUN 8 6 - 20 mg/dL   Creatinine, Ser 0.89 0.61 - 1.24 mg/dL   Calcium 8.7 (L) 8.9 - 10.3 mg/dL   Total Protein  5.7 (L) 6.5 - 8.1 g/dL   Albumin 3.5 3.5 - 5.0 g/dL   AST 81 (H) 15 - 41 U/L   ALT 374 (H) 17 - 63 U/L   Alkaline Phosphatase 216 (H) 38 - 126 U/L   Total Bilirubin 2.9 (H) 0.3 - 1.2 mg/dL   GFR calc non Af Amer >60 >60 mL/min   GFR calc Af Amer >60 >60 mL/min    Comment: (NOTE) The eGFR has been calculated using the CKD EPI equation. This calculation has not been validated in all clinical situations. eGFR's persistently <60 mL/min signify possible Chronic Kidney Disease.    Anion gap 9 5 - 15  Sedimentation rate     Status: None   Collection Time: 08/04/17  2:56 AM  Result Value Ref Range   Sed Rate 8 0 - 16 mm/hr   Dg Chest 2 View  Result Date: 08/03/2017 CLINICAL DATA:  Central  chest pain for 3 weeks. EXAM: CHEST  2 VIEW COMPARISON:  February 21, 2007 FINDINGS: The heart size and mediastinal contours are within normal limits. Both lungs are clear. The visualized skeletal structures are unremarkable. IMPRESSION: No active cardiopulmonary disease. Electronically Signed   By: Dorise Bullion III M.D   On: 08/03/2017 19:30   US Abdomen Complete  Result Date: 08/04/2017 CLINICAL DATA:  Abdominal pain EXAM: COMPLETE ABDOMINAL ULTRASOUND COMPARISON:  MR from the same day, CT from previous day FINDINGS: Gallbladder: Gallbladder is distended containing sludge. No wall thickening or pericholecystic fluid. Common bile duct:  Mildly dilated, 8.2 mm diameter. Liver: Homogeneous in echotexture without focal lesion or intrahepatic bile duct dilatation. Antegrade color Doppler signal in the portal vein. IVC:  Negative Pancreas: Visualized segments unremarkable, portions obscured by overlying bowel gas. Spleen: No focal lesion, 16.7 x 17.8 x 7.8 cm (volume = 1200 cm^3). Right Kidney: No hydronephrosis. 3 cm lower pole simple appearing cyst. 0.9 cm shadowing calculus in the upper pole collecting system. 14.5 cm in length. Left Kidney: 15.1 cm in length. No mass or hydronephrosis. 0.6 cm shadowing focus in the lower pole collecting system. Abdominal aorta: Fusiform dilatation up to 2.7 cm diameter in its mid segment. IMPRESSION: 1. Sludge filled gallbladder without other ultrasound evidence of cholecystitis . 2. Splenomegaly 3. Ectatic abdominal aorta at risk for aneurysm development. Recommend followup by ultrasound in 5 years. This recommendation follows ACR consensus guidelines: White Paper of the ACR Incidental Findings Committee II on Vascular Findings. J Am Coll Radiol 2013; 10:789-794. 4. Bilateral nephrolithiasis without hydronephrosis. Electronically Signed   By: Lucrezia Europe M.D.   On: 08/04/2017 10:40   Ct Abdomen Pelvis W Contrast  Result Date: 08/03/2017 CLINICAL DATA:  Lower abdominal pain,  back pain, and constipation for 1 month. EXAM: CT ABDOMEN AND PELVIS WITH CONTRAST TECHNIQUE: Multidetector CT imaging of the abdomen and pelvis was performed using the standard protocol following bolus administration of intravenous contrast. CONTRAST:  100 mL Isovue-300 COMPARISON:  02/10/2007 FINDINGS: Lower chest: Emphysematous changes and atelectasis in the lung bases. Coronary artery calcifications. Hepatobiliary: No focal liver abnormality is seen. No gallstones, gallbladder wall thickening, or biliary dilatation. Pancreas: There is heterogeneous fullness in the head of the pancreas with stranding around the peripancreatic fat and mild pancreatic ductal dilatation. This could represent focal inflammation or underlying mass lesion. Spleen: Normal in size without focal abnormality. Adrenals/Urinary Tract: No adrenal gland nodules. 8 mm stone in the midportion right kidney. 5 mm stone in the lower pole left kidney. Additional tiny punctate stones  in both kidneys. Benign-appearing cyst in the lower pole right kidney. No hydronephrosis or hydroureter. No ureteral stones identified. Bladder wall is not thickened. No bladder stones. Stomach/Bowel: Stomach, small bowel, and colon are not abnormally distended. No inflammatory changes or wall thickening identified. Appendix is not identified. Vascular/Lymphatic: Aortic atherosclerosis. No enlarged abdominal or pelvic lymph nodes. Reproductive: Prostate is unremarkable. Other: No abdominal wall hernia or abnormality. No abdominopelvic ascites. Musculoskeletal: Degenerative changes in the spine. Bridging anterior osteophytes in the lower thoracic and upper lumbar region. No destructive bone lesions. IMPRESSION: 1. Fullness in the head of the pancreas with stranding in the peripancreatic fat and pancreatic ductal dilatation. Consider focal acute pancreatitis versus underlying mass lesion. Laboratory correlation is recommended. Consider follow-up with elective MRI/MRCP to  exclude a pancreatic head mass. 2. Multiple bilateral nonobstructing intrarenal stones. No ureteral stone or obstruction. 3. Emphysematous changes and atelectasis in the lung bases. 4. Aortic atherosclerosis. Electronically Signed   By: Lucienne Capers M.D.   On: 08/03/2017 23:58   Mr Abdomen With Mrcp W Contrast  Result Date: 08/04/2017 CLINICAL DATA:  Bloatedness and constipation for the last 2 days. Lower abdominal pain. Abnormal CT demonstrating probable pancreatitis. EXAM: MRI ABDOMEN WITHOUT CONTRAST  (INCLUDING MRCP) TECHNIQUE: Multiplanar multisequence MR imaging of the abdomen was performed. Heavily T2-weighted images of the biliary and pancreatic ducts were obtained, and three-dimensional MRCP images were rendered by post processing. COMPARISON:  CT of 1 day prior and 02/10/2007. FINDINGS: Portions of exam, especially the MRCP dedicated series, are motion degraded. Lower chest: Mild cardiomegaly, without pericardial or pleural effusion. Hepatobiliary: A 6 mm right liver lobe too small to characterize lesion image 56/series 1412. Intrahepatic ducts are upper normal. Normal gallbladder. The common duct measures 8 mm on image 23/series 5, upper normal for patient age. No choledocholithiasis identified. The common duct is followed to the level of the pancreatic head, where it undergoes a transition to decompressed, felt to be just above the ampulla. Example images 71 through 76/series 1401. Pancreas: Mild pancreatic duct dilatation throughout the body and tail with duct irregularity. Example image 28/series 6. This duct dilatation is followed to the level of an accessory entrance into the duodenum, including image 31/series 6. Within the pancreatic head and uncinate process is an equivocal area of soft tissue fullness, including at on the order of 2.4 cm on image 80/series 1403. This area does demonstrate restricted diffusion on image 53/series 8, but maintains normal precontrast T1 hyperintensity,  including on image 86/series 1400. Moderate peripancreatic edema surrounds the head and uncinate process. Example image 32/series 7. No peripancreatic fluid collection. Spleen:  Splenomegaly, 17.8 cm craniocaudal. Adrenals/Urinary Tract: Normal adrenal glands. Normal left kidney. Lower pole right renal 2.8 cm cyst. No hydronephrosis. Stomach/Bowel: Normal stomach and abdominal bowel loops. Vascular/Lymphatic: Normal caliber of the aorta and branch vessels. Patent splenic and portal veins. No retroperitoneal or retrocrural adenopathy. Other:  Small volume perihepatic ascites. Musculoskeletal: No acute osseous abnormality. IMPRESSION: 1. Mildly motion degraded exam. 2. Findings which are favored to represent acute on chronic pancreatitis. Peripancreatic edema surrounding the head and uncinate process, superimposed upon mild diffuse pancreatic duct dilatation. Pancreas divisum. 3. Equivocal soft tissue fullness within the head and uncinate process of the pancreas. Suggestion of common duct narrowing in this region. Therefore, concurrent adenocarcinoma canal be excluded. Potential clinical strategies include medical therapy with repeat pre and post contrast abdominal MRI/MRCP at 3-4 weeks versus further characterization with endoscopic ultrasound with special attention to the head and uncinate process.  4. Indeterminate too small to characterize right hepatic lobe lesion. Recommend attention on follow-up. 5. Small volume abdominal ascites. 6. Splenomegaly. Electronically Signed   By: Abigail Miyamoto M.D.   On: 08/04/2017 07:27      Assessment/Plan  Pancreatitis - unable to r/o mass with imaging, GI involved and will do a EUS when inflammation improves  - this is likely 2/2 pancreatic head mass and less likely gallstone pancreatitis - agree with low fat diet in setting of little pain - will discuss with MD, we will follow  Thank you for the consult.   Kalman Drape, [facility] 08/04/2017,  4:04 PM Pager: [phone] Consults: [phone] Mon-Fri 7:00 am-4:30 pm Sat-Sun 7:00 am-11:30 am

## 2017-08-04 NOTE — ED Provider Notes (Signed)
2:06 AM LFTs are elevated.  Lipase is markedly elevated at 900.  Mild dilatation of the pancreatic duct.  The patient is still having pain.  Will admit to medicine for further workup and evaluation.  Patient will likely need MRCP.  Appreciate Dr. Tamala Julian from [facility], who will admit the patient.   Montine Circle, PA-C 08/04/17 [id number]    Pixie Casino, MD 08/07/17 [phone]

## 2017-08-04 NOTE — ED Notes (Signed)
Admitting paged about bp  

## 2017-08-04 NOTE — H&P (Signed)
History and Physical    Cory Kelly WUX:[id number] DOB: 18-Feb-1956 DOA: 08/03/2017  Referring MD/NP/PA: Montine Circle, PA-C PCP: Mancel Bale, PA-C  Patient coming from: home  Chief Complaint: Abdominal pain  I have personally briefly reviewed patient's old medical records in [place]   HPI: Cory Kelly is a 62 y.o. 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  He reports having abdominal pain/discomfort that he reports is hard to describe.  Notes bloatedness and constipation with symptoms lasting 1-2 days and then self resolving.  Patient notes a lot of social stressors including the passing of his father on December 1, making sure that his mother's taking care of, work, and coaching position.  He was evaluated by his PCP and seems symptoms were related to IBS.  He was started on MiraLAX and Gas-X.  However, when symptoms persisted he became more concerned.  With this most recent episode that he has had constant pain for the last 4-5 days.  Noting pain radiating to his back and up into his upper chest.  Initially patient denied any significant correlation with food but has noticed that symptoms worsen with trying to eat food. Denies any alcohol use, vomiting, diarrhea, shortness of breath, fever, chills.  The only other associated symptoms included complaints of elevated blood pressure up to 190/120, new itchy rash, and weight loss of approximately 20 pounds over the last 2 months.  Family history is positive for uncle with history of colon cancer.  When he saw his PCP today he was started on losartan, but not really been on any medications or had any issues with his blood pressure prior.  He is scheduled to have a colonoscopy next week.  ED Course: Upon admission into the emergency department patient was noted to be afebrile,  blood pressure 158/79 to 190/99.  Lipase noted to be 900 with AST 129, ALT 475, total bilirubin 2.9, indirect bilirubin 2.5, and troponin i negative x 2.  CT scan showing signs of acute pancreatitis.  Patient was given 1 L of normal saline IV fluid, 4 mg of IV morphine, and 1 mg of IV Dilaudid.  TRH called to admit for pancreatitis.   Review of Systems  Constitutional: Positive for malaise/fatigue and weight loss. Negative for chills and fever.  HENT: Positive for hearing loss. Negative for ear discharge.   Eyes: Negative for photophobia.  Respiratory: Negative for cough, sputum production and shortness of breath.   Cardiovascular: Negative for chest pain, claudication and leg swelling.  Gastrointestinal: Positive for abdominal pain and constipation. Negative for blood in stool and vomiting.       Positive for bloating  Genitourinary: Negative for dysuria, frequency and hematuria.  Musculoskeletal: Positive for back pain.  Skin: Positive for itching and rash.  Neurological: Negative for focal weakness and loss of consciousness.  Psychiatric/Behavioral: Negative for substance abuse. The patient is not nervous/anxious.     Past Medical History:  Diagnosis Date  . Abdominal pain    due to bloating  . Bloating   . Cancer ([place])    skin cancer  . Fatigue   . History of weight loss   . HOH (hard of hearing)    no hearing aids  . Stress    loss of father in Dec 2018.    Past Surgical History:  Procedure Laterality Date  . APPENDECTOMY    .  EYE SURGERY     lasik/left eye  . SKIN CANCER EXCISION       reports that  has never smoked. he has never used smokeless tobacco. He reports that he does not drink alcohol or use drugs.  No Known Allergies  Family History  Problem Relation Age of Onset  . Hyperlipidemia Father   . Heart disease Father   . Dementia Father   . Memory loss Mother     Prior to Admission medications   Medication Sig Start Date End Date Taking? Authorizing  Provider  losartan (COZAAR) 25 MG tablet Take 25 mg by mouth daily.   Yes [provider]  magnesium 30 MG tablet Take 30 mg by mouth at bedtime.    Yes [provider]  Polyethylene Glycol 3350 (MIRALAX PO) Take 1 Applicatorful by mouth at bedtime. Take 1 capful daily    Yes [provider]  ranitidine (ZANTAC) 150 MG tablet Take 150 mg by mouth 2 (two) times daily.   Yes [provider]  Simethicone (GAS-X PO) Take 2 tablets by mouth 2 (two) times daily. Take 2 pills daily    Yes [provider]  hydrochlorothiazide (HYDRODIURIL) 25 MG tablet Take 1 tablet (25 mg total) by mouth daily. 08/04/17   Mabe, Forbes Cellar, MD  HYDROcodone-acetaminophen (NORCO/VICODIN) 5-325 MG tablet Take 1 tablet by mouth every 4 (four) hours as needed. 08/04/17   Mabe, Forbes Cellar, MD  ondansetron (ZOFRAN ODT) 4 MG disintegrating tablet Take 1 tablet (4 mg total) by mouth every 8 (eight) hours as needed. 08/04/17   Pixie Casino, MD    Physical Exam:  Constitutional: NAD, calm, comfortable Vitals:   08/03/17 2245 08/03/17 2321 08/04/17 0000 08/04/17 0015  BP: (!) 190/99 (!) 170/90 (!) 168/87 (!) 164/92  Pulse: 60 60 (!) 58 (!) 58  Resp: (!) 25 (!) 26 (!) 23 19  Temp:      TempSrc:      SpO2: 97% 96% 95% 98%   Eyes: PERRL, lids and conjunctivae normal ENMT: Mucous membranes are dry. Posterior pharynx clear of any exudate or lesions.Normal dentition.  Neck: normal, supple, no masses, no thyromegaly Respiratory: clear to auscultation bilaterally, no wheezing, no crackles. Normal respiratory effort. No accessory muscle use.  Cardiovascular: Regular rate and rhythm, no murmurs / rubs / gallops. No extremity edema. 2+ pedal pulses. No carotid bruits.  Abdomen: Mild generalized tenderness, no masses palpated. No hepatosplenomegaly. Bowel sounds positive.  No guarding or significant decision noted. Musculoskeletal: no clubbing / cyanosis. No joint deformity upper and lower  extremities. Good ROM, no contractures. Normal muscle tone.  Skin: Nonspecific rash of the anterior chest Neurologic: CN 2-12 grossly intact. Sensation intact, DTR normal. Strength 5/5 in all 4.  Psychiatric: Normal judgment and insight. Alert and oriented x 3. Normal mood.     Labs on Admission: I have personally reviewed following labs and imaging studies  CBC: Recent Labs  Lab 08/03/17 1910  WBC 6.0  HGB 14.7  HCT 42.0  MCV 86.2  PLT 867*   Basic Metabolic Panel: Recent Labs  Lab 08/03/17 1910  NA 138  K 3.6  CL 104  CO2 23  GLUCOSE 109*  BUN 8  CREATININE 0.96  CALCIUM 8.9   GFR: Estimated Creatinine Clearance: 112.2 mL/min (by C-G formula based on SCr of 0.96 mg/dL). Liver Function Tests: Recent Labs  Lab 08/03/17 1910  AST 129*  ALT 472*  ALKPHOS 252*  BILITOT 2.9*  PROT 6.5  ALBUMIN 4.0   Recent Labs  Lab 08/03/17 1910  LIPASE 900*   No results for input(s): AMMONIA in the last 168 hours. Coagulation Profile: No results for input(s): INR, PROTIME in the last 168 hours. Cardiac Enzymes: No results for input(s): CKTOTAL, CKMB, CKMBINDEX, TROPONINI in the last 168 hours. BNP (last 3 results) No results for input(s): PROBNP in the last 8760 hours. HbA1C: No results for input(s): HGBA1C in the last 72 hours. CBG: No results for input(s): GLUCAP in the last 168 hours. Lipid Profile: No results for input(s): CHOL, HDL, LDLCALC, TRIG, CHOLHDL, LDLDIRECT in the last 72 hours. Thyroid Function Tests: No results for input(s): TSH, T4TOTAL, FREET4, T3FREE, THYROIDAB in the last 72 hours. Anemia Panel: No results for input(s): VITAMINB12, FOLATE, FERRITIN, TIBC, IRON, RETICCTPCT in the last 72 hours. Urine analysis:    Component Value Date/Time   COLORURINE YELLOW 02/10/2007 1109   APPEARANCEUR CLOUDY (A) 02/10/2007 1109   LABSPEC 1.021 02/10/2007 1109   PHURINE 6.0 02/10/2007 1109   GLUCOSEU NEGATIVE 02/10/2007 1109   HGBUR LARGE (A) 02/10/2007  1109   BILIRUBINUR NEGATIVE 02/10/2007 1109   KETONESUR NEGATIVE 02/10/2007 1109   PROTEINUR 30 (A) 02/10/2007 1109   UROBILINOGEN 0.2 02/10/2007 1109   NITRITE NEGATIVE 02/10/2007 1109   LEUKOCYTESUR NEGATIVE 02/10/2007 1109   Sepsis Labs: No results found for this or any previous visit (from the past 240 hour(s)).   Radiological Exams on Admission: Dg Chest 2 View  Result Date: 08/03/2017 CLINICAL DATA:  Central chest pain for 3 weeks. EXAM: CHEST  2 VIEW COMPARISON:  February 21, 2007 FINDINGS: The heart size and mediastinal contours are within normal limits. Both lungs are clear. The visualized skeletal structures are unremarkable. IMPRESSION: No active cardiopulmonary disease. Electronically Signed   By: Dorise Bullion III M.D   On: 08/03/2017 19:30   Ct Abdomen Pelvis W Contrast  Result Date: 08/03/2017 CLINICAL DATA:  Lower abdominal pain, back pain, and constipation for 1 month. EXAM: CT ABDOMEN AND PELVIS WITH CONTRAST TECHNIQUE: Multidetector CT imaging of the abdomen and pelvis was performed using the standard protocol following bolus administration of intravenous contrast. CONTRAST:  100 mL Isovue-300 COMPARISON:  02/10/2007 FINDINGS: Lower chest: Emphysematous changes and atelectasis in the lung bases. Coronary artery calcifications. Hepatobiliary: No focal liver abnormality is seen. No gallstones, gallbladder wall thickening, or biliary dilatation. Pancreas: There is heterogeneous fullness in the head of the pancreas with stranding around the peripancreatic fat and mild pancreatic ductal dilatation. This could represent focal inflammation or underlying mass lesion. Spleen: Normal in size without focal abnormality. Adrenals/Urinary Tract: No adrenal gland nodules. 8 mm stone in the midportion right kidney. 5 mm stone in the lower pole left kidney. Additional tiny punctate stones in both kidneys. Benign-appearing cyst in the lower pole right kidney. No hydronephrosis or hydroureter. No  ureteral stones identified. Bladder wall is not thickened. No bladder stones. Stomach/Bowel: Stomach, small bowel, and colon are not abnormally distended. No inflammatory changes or wall thickening identified. Appendix is not identified. Vascular/Lymphatic: Aortic atherosclerosis. No enlarged abdominal or pelvic lymph nodes. Reproductive: Prostate is unremarkable. Other: No abdominal wall hernia or abnormality. No abdominopelvic ascites. Musculoskeletal: Degenerative changes in the spine. Bridging anterior osteophytes in the lower thoracic and upper lumbar region. No destructive bone lesions. IMPRESSION: 1. Fullness in the head of the pancreas with stranding in the peripancreatic fat and pancreatic ductal dilatation. Consider focal acute pancreatitis versus underlying mass lesion. Laboratory correlation is recommended. Consider follow-up with elective MRI/MRCP  to exclude a pancreatic head mass. 2. Multiple bilateral nonobstructing intrarenal stones. No ureteral stone or obstruction. 3. Emphysematous changes and atelectasis in the lung bases. 4. Aortic atherosclerosis. Electronically Signed   By: Lucienne Capers M.D.   On: 08/03/2017 23:58    EKG: Independently reviewed.  Normal sinus rhythm at 66 bpm  Assessment/Plan Pancreatitis, abdominal pain: Acute.  Resents with intermittent complaints of abdominal pain over the last 1 month.  Lipase 900 CT scan showing signs of acute pancreatitis versus underlying mass.  Patient does not drink alcohol and/or take any significant medications.  Patient had 2 negative troponins symptoms less likely cardiac in nature. - Admit to MedSurg bed - NPO - Normal saline IV fluids at 150 mL/h as tolerated - Pain control as needed - Incentive spirometry - Check MRCP in a.m. - Consider need of consult to gastroenterology  Elevated liver enzymes: Alkaline phosphatase 252,, AST of 129, ALT 472, total bilirubin 2.9. - Continue to monitor  Weight loss: Patient reports weight  loss 20 lbs over last two months.  Possibility of underlying malignancy. - Check ESR - Follow-up MRCP  Hypertensive urgency: Blood pressures initially noted to be elevated as high as 190/99.  Patient previously not on any blood pressure medications, but was started on losartan prior to arrival by PCP. - Restart losartan when medically appropriate  - Hydralazine IV prn sBP  Rash: Patient notes new rash that he reports is itchy. - Hydroxyzine as needed  History of basal cell skin cancer: Patient followed by dermatology notes multiple previous skin biopsies and removals. - Continue outpatient follow-up  DVT prophylaxis: lovenox   Code Status: Full Family Communication: Skills plan of care with the patient and family present at bedside Disposition Plan:   Consults called: none  Admission status: Inpatient   Norval Morton MD [facility] Pager [phone]   If 7PM-7AM, please contact night-coverage [email or web address] Password [email or web address]  08/04/2017, 2:01 AM

## 2017-08-04 NOTE — ED Notes (Signed)
Patient transported to MRI 

## 2017-08-05 DIAGNOSIS — R109 Unspecified abdominal pain: Secondary | ICD-10-CM

## 2017-08-05 LAB — COMPREHENSIVE METABOLIC PANEL
ALK PHOS: 219 U/L — AB (ref 38–126)
ALT: 273 U/L — ABNORMAL HIGH (ref 17–63)
ANION GAP: 9 (ref 5–15)
AST: 74 U/L — ABNORMAL HIGH (ref 15–41)
Albumin: 3.2 g/dL — ABNORMAL LOW (ref 3.5–5.0)
BILIRUBIN TOTAL: 3.3 mg/dL — AB (ref 0.3–1.2)
BUN: 9 mg/dL (ref 6–20)
CALCIUM: 8.7 mg/dL — AB (ref 8.9–10.3)
CO2: 25 mmol/L (ref 22–32)
Chloride: 104 mmol/L (ref 101–111)
Creatinine, Ser: 0.89 mg/dL (ref 0.61–1.24)
GFR calc Af Amer: >60 mL/min (ref >60)
GFR calc non Af Amer: >60 mL/min (ref >60)
GLUCOSE: 114 mg/dL — AB (ref 65–99)
POTASSIUM: 3.6 mmol/L (ref 3.5–5.1)
Sodium: 138 mmol/L (ref 135–145)
Total Protein: 5.7 g/dL — ABNORMAL LOW (ref 6.5–8.1)

## 2017-08-05 LAB — LIPASE, BLOOD: Lipase: 302 U/L — ABNORMAL HIGH (ref 11–51)

## 2017-08-05 LAB — LIPID PANEL
CHOL/HDL RATIO: 4.1 ratio
CHOLESTEROL: 181 mg/dL (ref 0–200)
HDL: 44 mg/dL (ref >40)
LDL Cholesterol: 118 mg/dL — ABNORMAL HIGH (ref 0–99)
TRIGLYCERIDES: 95 mg/dL (ref <150)
VLDL: 19 mg/dL (ref 0–40)

## 2017-08-05 LAB — MAGNESIUM: Magnesium: 1.8 mg/dL (ref 1.7–2.4)

## 2017-08-05 LAB — CANCER ANTIGEN 19-9: CAN 19-9: 977 U/mL — AB (ref 0–35)

## 2017-08-05 MED ORDER — LOSARTAN POTASSIUM 50 MG PO TABS: 50.0000 mg | ORAL_TABLET | Freq: Every day | ORAL | Status: DC

## 2017-08-05 MED ORDER — ONDANSETRON 4 MG PO TBDP: 4.0000 mg | ORAL_TABLET | Freq: Three times a day (TID) | ORAL | 0 refills | Status: DC | PRN

## 2017-08-05 MED ORDER — TRAMADOL HCL 50 MG PO TABS: 50.0000 mg | ORAL_TABLET | Freq: Two times a day (BID) | ORAL | 0 refills | Status: DC | PRN

## 2017-08-05 MED ORDER — LOSARTAN POTASSIUM 50 MG PO TABS: 50.0000 mg | ORAL_TABLET | Freq: Every day | ORAL | 3 refills | Status: DC

## 2017-08-05 NOTE — Discharge Summary (Signed)
Physician Discharge Summary   Patient ID: Cory Kelly MRN: [id number] DOB/AGE: June 06, 1956 62 y.o.  Admit date: 08/03/2017 Discharge date: 08/05/2017  Primary Care Physician:  Mancel Bale, PA-C  Discharge Diagnoses:    . Acute pancreatitis . Essential hypertension Ectatic abdominal aorta   Consults: Gastroenterology General surgery  Recommendations for Outpatient Follow-up:  1. GI will follow LFTs on Wednesday next week, patient will have scheduled EUS with possible ERCP next week on 2/7, office will coordinate. 2. Please repeat CBC/CMET at next visit 3. Patient recommended to avoid alcohol 4. Losartan increased to 50 mg daily   DIET: Heart healthy diet, low-fat    Allergies:  No Known Allergies   DISCHARGE MEDICATIONS: Allergies as of 08/05/2017   No Known Allergies     Medication List    TAKE these medications   GAS-X PO Take 2 tablets by mouth 2 (two) times daily. Take 2 pills daily   losartan 50 MG tablet Commonly known as:  COZAAR Take 1 tablet (50 mg total) by mouth daily. What changed:    medication strength  how much to take   magnesium 30 MG tablet Take 30 mg by mouth at bedtime.   MIRALAX PO Take 1 Applicatorful by mouth at bedtime. Take 1 capful daily   ondansetron 4 MG disintegrating tablet Commonly known as:  ZOFRAN ODT Take 1 tablet (4 mg total) by mouth every 8 (eight) hours as needed.   ranitidine 150 MG tablet Commonly known as:  ZANTAC Take 150 mg by mouth 2 (two) times daily.   traMADol 50 MG tablet Commonly known as:  ULTRAM Take 1 tablet (50 mg total) by mouth every 12 (twelve) hours as needed for severe pain.        Brief H and P: For complete details please refer to admission H and P, but in brief Cory Kelly is an 62 y.o. male past medical history of essential hypertension, basal cell carcinoma hard of hearing who comes into the hospital complaining of abdominal discomfort over the last several months  intermittently, accompanied by constipation feeling of being bloated that usually last 1 or 2 days and then resolves, as the days progressed his abdominal pain got worse with radiation to the back, he also relates about a 15-20 pound weight loss unintentional in the last 30 days in the ED he was found to have a lipase of 900, AST 129 ALT 475 total bilirubin of 2.9 alkaline phosphatase of 252.  CT scan of the abdomen and pelvis showed fullness of the head of the pancreas with stranding and ductal dilation   Hospital Course:     Acute pancreatitis with elevated LFT's -Patient denied any alcohol use, patient was started on n.p.o. status, IV fluid hydration and pain control. -MRCP showed mild diffuse pancreatic ductal dilatation  -Abdominal ultrasound showed sludge filled gallbladder without other ultrasound evidence of cholecystitis, splenomegaly -LFTs trending down, lipase was 900 at the time of admission, improved to 302 at the time of discharge, LDL 118, triglycerides 95 -CA-[id number], GI planning EUS with possible ERCP on 2/7 once acute inflammation subsides to allow better imaging -Per surgery, if endoscopic evaluation is negative for any pancreatic neoplasm then he will be referred back to [facility] surgery for cholecystectomy.  However if there is a question of pancreatic neoplasm then patient will be referred to Dr. Barry Dienes after the ERCP/EUS     Essential hypertension -BP consistently elevated, possibly could be due to pain and acute pancreatitis -Losartan increased  to 50 mg daily  Ectatic abdominal aorta -Incidentally seen on abdominal ultrasound, recommended follow-up ultrasound in 5 years.   Day of Discharge  No complaints wants to go home, tolerating solid diet  BP (!) 173/76 (BP Location: Left Arm)   Pulse 60   Temp 98.7 F (37.1 C) (Oral)   Resp 18   Ht 6\' 5"  (1.956 m)   Wt 108.8 kg (239 lb 13.8 oz)   SpO2 100%   BMI 28.44 kg/m   Physical Exam: General: Alert  and awake oriented x3 not in any acute distress. HEENT: anicteric sclera, pupils reactive to light and accommodation CVS: S1-S2 clear no murmur rubs or gallops Chest: clear to auscultation bilaterally, no wheezing rales or rhonchi Abdomen: soft nontender, nondistended, normal bowel sounds Extremities: no cyanosis, clubbing or edema noted bilaterally Neuro: Cranial nerves II-XII intact, no focal neurological deficits   The results of significant diagnostics from this hospitalization (including imaging, microbiology, ancillary and laboratory) are listed below for reference.    LAB RESULTS: Basic Metabolic Panel: Recent Labs  Lab 08/04/17 0256 08/05/17 0559  NA 138 138  K 3.8 3.6  CL 106 104  CO2 23 25  GLUCOSE 107* 114*  BUN 8 9  CREATININE 0.89 0.89  CALCIUM 8.7* 8.7*  MG  --  1.8   Liver Function Tests: Recent Labs  Lab 08/04/17 0256 08/05/17 0559  AST 81* 74*  ALT 374* 273*  ALKPHOS 216* 219*  BILITOT 2.9* 3.3*  PROT 5.7* 5.7*  ALBUMIN 3.5 3.2*   Recent Labs  Lab 08/03/17 1910 08/05/17 0559  LIPASE 900* 302*   No results for input(s): AMMONIA in the last 168 hours. CBC: Recent Labs  Lab 08/03/17 1910 08/04/17 0256  WBC 6.0 5.1  HGB 14.7 13.1  HCT 42.0 38.6*  MCV 86.2 86.0  PLT 120* 102*   Cardiac Enzymes: No results for input(s): CKTOTAL, CKMB, CKMBINDEX, TROPONINI in the last 168 hours. BNP: Invalid input(s): POCBNP CBG: No results for input(s): GLUCAP in the last 168 hours.  Significant Diagnostic Studies:  Dg Chest 2 View  Result Date: 08/03/2017 CLINICAL DATA:  Central chest pain for 3 weeks. EXAM: CHEST  2 VIEW COMPARISON:  February 21, 2007 FINDINGS: The heart size and mediastinal contours are within normal limits. Both lungs are clear. The visualized skeletal structures are unremarkable. IMPRESSION: No active cardiopulmonary disease. Electronically Signed   By: Dorise Bullion III M.D   On: 08/03/2017 19:30   US Abdomen Complete  Result  Date: 08/04/2017 CLINICAL DATA:  Abdominal pain EXAM: COMPLETE ABDOMINAL ULTRASOUND COMPARISON:  MR from the same day, CT from previous day FINDINGS: Gallbladder: Gallbladder is distended containing sludge. No wall thickening or pericholecystic fluid. Common bile duct:  Mildly dilated, 8.2 mm diameter. Liver: Homogeneous in echotexture without focal lesion or intrahepatic bile duct dilatation. Antegrade color Doppler signal in the portal vein. IVC:  Negative Pancreas: Visualized segments unremarkable, portions obscured by overlying bowel gas. Spleen: No focal lesion, 16.7 x 17.8 x 7.8 cm (volume = 1200 cm^3). Right Kidney: No hydronephrosis. 3 cm lower pole simple appearing cyst. 0.9 cm shadowing calculus in the upper pole collecting system. 14.5 cm in length. Left Kidney: 15.1 cm in length. No mass or hydronephrosis. 0.6 cm shadowing focus in the lower pole collecting system. Abdominal aorta: Fusiform dilatation up to 2.7 cm diameter in its mid segment. IMPRESSION: 1. Sludge filled gallbladder without other ultrasound evidence of cholecystitis . 2. Splenomegaly 3. Ectatic abdominal aorta at risk for  aneurysm development. Recommend followup by ultrasound in 5 years. This recommendation follows ACR consensus guidelines: White Paper of the ACR Incidental Findings Committee II on Vascular Findings. J Am Coll Radiol 2013; 10:789-794. 4. Bilateral nephrolithiasis without hydronephrosis. Electronically Signed   By: Lucrezia Europe M.D.   On: 08/04/2017 10:40   Ct Abdomen Pelvis W Contrast  Result Date: 08/03/2017 CLINICAL DATA:  Lower abdominal pain, back pain, and constipation for 1 month. EXAM: CT ABDOMEN AND PELVIS WITH CONTRAST TECHNIQUE: Multidetector CT imaging of the abdomen and pelvis was performed using the standard protocol following bolus administration of intravenous contrast. CONTRAST:  100 mL Isovue-300 COMPARISON:  02/10/2007 FINDINGS: Lower chest: Emphysematous changes and atelectasis in the lung bases.  Coronary artery calcifications. Hepatobiliary: No focal liver abnormality is seen. No gallstones, gallbladder wall thickening, or biliary dilatation. Pancreas: There is heterogeneous fullness in the head of the pancreas with stranding around the peripancreatic fat and mild pancreatic ductal dilatation. This could represent focal inflammation or underlying mass lesion. Spleen: Normal in size without focal abnormality. Adrenals/Urinary Tract: No adrenal gland nodules. 8 mm stone in the midportion right kidney. 5 mm stone in the lower pole left kidney. Additional tiny punctate stones in both kidneys. Benign-appearing cyst in the lower pole right kidney. No hydronephrosis or hydroureter. No ureteral stones identified. Bladder wall is not thickened. No bladder stones. Stomach/Bowel: Stomach, small bowel, and colon are not abnormally distended. No inflammatory changes or wall thickening identified. Appendix is not identified. Vascular/Lymphatic: Aortic atherosclerosis. No enlarged abdominal or pelvic lymph nodes. Reproductive: Prostate is unremarkable. Other: No abdominal wall hernia or abnormality. No abdominopelvic ascites. Musculoskeletal: Degenerative changes in the spine. Bridging anterior osteophytes in the lower thoracic and upper lumbar region. No destructive bone lesions. IMPRESSION: 1. Fullness in the head of the pancreas with stranding in the peripancreatic fat and pancreatic ductal dilatation. Consider focal acute pancreatitis versus underlying mass lesion. Laboratory correlation is recommended. Consider follow-up with elective MRI/MRCP to exclude a pancreatic head mass. 2. Multiple bilateral nonobstructing intrarenal stones. No ureteral stone or obstruction. 3. Emphysematous changes and atelectasis in the lung bases. 4. Aortic atherosclerosis. Electronically Signed   By: Lucienne Capers M.D.   On: 08/03/2017 23:58   Mr Abdomen With Mrcp W Contrast  Result Date: 08/04/2017 CLINICAL DATA:  Bloatedness and  constipation for the last 2 days. Lower abdominal pain. Abnormal CT demonstrating probable pancreatitis. EXAM: MRI ABDOMEN WITHOUT CONTRAST  (INCLUDING MRCP) TECHNIQUE: Multiplanar multisequence MR imaging of the abdomen was performed. Heavily T2-weighted images of the biliary and pancreatic ducts were obtained, and three-dimensional MRCP images were rendered by post processing. COMPARISON:  CT of 1 day prior and 02/10/2007. FINDINGS: Portions of exam, especially the MRCP dedicated series, are motion degraded. Lower chest: Mild cardiomegaly, without pericardial or pleural effusion. Hepatobiliary: A 6 mm right liver lobe too small to characterize lesion image 56/series 1412. Intrahepatic ducts are upper normal. Normal gallbladder. The common duct measures 8 mm on image 23/series 5, upper normal for patient age. No choledocholithiasis identified. The common duct is followed to the level of the pancreatic head, where it undergoes a transition to decompressed, felt to be just above the ampulla. Example images 71 through 76/series 1401. Pancreas: Mild pancreatic duct dilatation throughout the body and tail with duct irregularity. Example image 28/series 6. This duct dilatation is followed to the level of an accessory entrance into the duodenum, including image 31/series 6. Within the pancreatic head and uncinate process is an equivocal area of soft  tissue fullness, including at on the order of 2.4 cm on image 80/series 1403. This area does demonstrate restricted diffusion on image 53/series 8, but maintains normal precontrast T1 hyperintensity, including on image 86/series 1400. Moderate peripancreatic edema surrounds the head and uncinate process. Example image 32/series 7. No peripancreatic fluid collection. Spleen:  Splenomegaly, 17.8 cm craniocaudal. Adrenals/Urinary Tract: Normal adrenal glands. Normal left kidney. Lower pole right renal 2.8 cm cyst. No hydronephrosis. Stomach/Bowel: Normal stomach and abdominal  bowel loops. Vascular/Lymphatic: Normal caliber of the aorta and branch vessels. Patent splenic and portal veins. No retroperitoneal or retrocrural adenopathy. Other:  Small volume perihepatic ascites. Musculoskeletal: No acute osseous abnormality. IMPRESSION: 1. Mildly motion degraded exam. 2. Findings which are favored to represent acute on chronic pancreatitis. Peripancreatic edema surrounding the head and uncinate process, superimposed upon mild diffuse pancreatic duct dilatation. Pancreas divisum. 3. Equivocal soft tissue fullness within the head and uncinate process of the pancreas. Suggestion of common duct narrowing in this region. Therefore, concurrent adenocarcinoma canal be excluded. Potential clinical strategies include medical therapy with repeat pre and post contrast abdominal MRI/MRCP at 3-4 weeks versus further characterization with endoscopic ultrasound with special attention to the head and uncinate process. 4. Indeterminate too small to characterize right hepatic lobe lesion. Recommend attention on follow-up. 5. Small volume abdominal ascites. 6. Splenomegaly. Electronically Signed   By: Abigail Miyamoto M.D.   On: 08/04/2017 07:27    2D ECHO:   Disposition and Follow-up: Discharge Instructions    Diet - low sodium heart healthy   Complete by:  As directed    Increase activity slowly   Complete by:  As directed        DISPOSITION: Home   DISCHARGE FOLLOW-UP Follow-up Information    Weber, Damaris Hippo, PA-C Follow up in 2 week(s).   Specialty:  Physician Assistant Contact information: [place] [address] [phone]        Milus Banister, MD Follow up.   Specialty:  Gastroenterology Why:  office will call you with appt  Contact information: [address] [place] [address] [phone]            Time spent on Discharge: 35 minutes  Signed:   Estill Cotta M.D. [facility] 08/05/2017, 10:52 AM Pager: [phone]

## 2017-08-05 NOTE — H&P (View-Only) (Signed)
[facility] Progress Note    Since last GI note: Feels well, wants to go home.  No abd pains, n/v.  Objective: Vital signs in last 24 hours: Temp:  [98.1 F (36.7 C)-98.7 F (37.1 C)] 98.7 F (37.1 C) (02/02 0514) Pulse Rate:  [50-62] 60 (02/02 0514) Resp:  [15-19] 18 (02/02 0514) BP: (154-176)/(66-90) 173/76 (02/02 0514) SpO2:  [96 %-100 %] 100 % (02/02 0514) Weight:  [239 lb 13.8 oz (108.8 kg)] 239 lb 13.8 oz (108.8 kg) (02/01 1138) Last BM Date: 08/01/17 General: alert and oriented times 3 Heart: regular rate and rythm Abdomen: soft, non-tender, non-distended, normal bowel sounds   Lab Results: Recent Labs    08/03/17 1910 08/04/17 0256  WBC 6.0 5.1  HGB 14.7 13.1  PLT 120* 102*  MCV 86.2 86.0   Recent Labs    08/03/17 1910 08/04/17 0256 08/05/17 0559  NA 138 138 138  K 3.6 3.8 3.6  CL 104 106 104  CO2 23 23 25   GLUCOSE 109* 107* 114*  BUN 8 8 9   CREATININE 0.96 0.89 0.89  CALCIUM 8.9 8.7* 8.7*   Recent Labs    08/03/17 1910 08/04/17 0256 08/05/17 0559  PROT 6.5 5.7* 5.7*  ALBUMIN 4.0 3.5 3.2*  AST 129* 81* 74*  ALT 472* 374* 273*  ALKPHOS 252* 216* 219*  BILITOT 2.9* 2.9* 3.3*  BILIDIR 0.4  --   --   IBILI 2.5*  --   --      Medications: Scheduled Meds: . enoxaparin (LOVENOX) injection  40 mg Subcutaneous Q24H  . losartan  25 mg Oral Daily  . magnesium hydroxide  30 mL Oral Daily   Continuous Infusions: . lactated ringers 200 mL/hr at 08/05/17 0500   PRN Meds:.acetaminophen **OR** acetaminophen, hydrALAZINE, HYDROmorphone (DILAUDID) injection, hydrOXYzine, ipratropium-albuterol, ondansetron **OR** ondansetron (ZOFRAN) IV    Assessment/Plan: 62 y.o. male with mild acute pancreatitis  Due to sludge in GB noted on [place] or due to underlying mass? CA 19-9 elevation concerning and certainly points more towards the latter.  He and his wife understand.  His bilirubin is nearly all indirect (hep function at admit) and so I do not  think he necessarily has biliary obstruction. 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   Planning on EUS +/- ERCP on this Thursday Feb 7, my office will coordinate.  Will repeat LFTs at admit in endo. This will give some time for acute inflammation to subside, allowing for more optimal imaging by [place].  OK to d/c today.  Milus Banister, MD  08/05/2017, 9:29 AM [facility] Pager [phone]

## 2017-08-05 NOTE — Progress Notes (Signed)
[facility] Progress Note    Since last GI note: Feels well, wants to go home.  No abd pains, n/v.  Objective: Vital signs in last 24 hours: Temp:  [98.1 F (36.7 C)-98.7 F (37.1 C)] 98.7 F (37.1 C) (02/02 0514) Pulse Rate:  [50-62] 60 (02/02 0514) Resp:  [15-19] 18 (02/02 0514) BP: (154-176)/(66-90) 173/76 (02/02 0514) SpO2:  [96 %-100 %] 100 % (02/02 0514) Weight:  [239 lb 13.8 oz (108.8 kg)] 239 lb 13.8 oz (108.8 kg) (02/01 1138) Last BM Date: 08/01/17 General: alert and oriented times 3 Heart: regular rate and rythm Abdomen: soft, non-tender, non-distended, normal bowel sounds   Lab Results: Recent Labs    08/03/17 1910 08/04/17 0256  WBC 6.0 5.1  HGB 14.7 13.1  PLT 120* 102*  MCV 86.2 86.0   Recent Labs    08/03/17 1910 08/04/17 0256 08/05/17 0559  NA 138 138 138  K 3.6 3.8 3.6  CL 104 106 104  CO2 23 23 25   GLUCOSE 109* 107* 114*  BUN 8 8 9   CREATININE 0.96 0.89 0.89  CALCIUM 8.9 8.7* 8.7*   Recent Labs    08/03/17 1910 08/04/17 0256 08/05/17 0559  PROT 6.5 5.7* 5.7*  ALBUMIN 4.0 3.5 3.2*  AST 129* 81* 74*  ALT 472* 374* 273*  ALKPHOS 252* 216* 219*  BILITOT 2.9* 2.9* 3.3*  BILIDIR 0.4  --   --   IBILI 2.5*  --   --      Medications: Scheduled Meds: . enoxaparin (LOVENOX) injection  40 mg Subcutaneous Q24H  . losartan  25 mg Oral Daily  . magnesium hydroxide  30 mL Oral Daily   Continuous Infusions: . lactated ringers 200 mL/hr at 08/05/17 0500   PRN Meds:.acetaminophen **OR** acetaminophen, hydrALAZINE, HYDROmorphone (DILAUDID) injection, hydrOXYzine, ipratropium-albuterol, ondansetron **OR** ondansetron (ZOFRAN) IV    Assessment/Plan: 62 y.o. male with mild acute pancreatitis  Due to sludge in GB noted on [place] or due to underlying mass? CA 19-9 elevation concerning and certainly points more towards the latter.  He and his wife understand.  His bilirubin is nearly all indirect (hep function at admit) and so I do not  think he necessarily has biliary obstruction. 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   Planning on EUS +/- ERCP on this Thursday Feb 7, my office will coordinate.  Will repeat LFTs at admit in endo. This will give some time for acute inflammation to subside, allowing for more optimal imaging by [place].  OK to d/c today.  Milus Banister, MD  08/05/2017, 9:29 AM [facility] Pager [phone]

## 2017-08-05 NOTE — Progress Notes (Signed)
Subjective: Patient feels well and wants to go home.  Wife present in room. Denies pain or nausea Lipase 302.  Potassium 3.6.  AST 74.  A LT 273.  Total bilirubin 3.3.   Objective: Vital signs in last 24 hours: Temp:  [98.1 F (36.7 C)-98.7 F (37.1 C)] 98.7 F (37.1 C) (02/02 0514) Pulse Rate:  [50-62] 60 (02/02 0514) Resp:  [15-19] 18 (02/02 0514) BP: (154-176)/(66-90) 173/76 (02/02 0514) SpO2:  [96 %-100 %] 100 % (02/02 0514) Weight:  [108.8 kg (239 lb 13.8 oz)] 108.8 kg (239 lb 13.8 oz) (02/01 1138) Last BM Date: 08/01/17  Intake/Output from previous day: 02/01 0701 - 02/02 0700 In: 3510 [I.V.:3510] Out: -  Intake/Output this shift: No intake/output data recorded.  General appearance: alert.  No distress. Resp: clear to auscultation bilaterally GI: soft, non-tender; bowel sounds normal; no masses,  no organomegaly Extremities: extremities normal, atraumatic, no cyanosis or edema  Lab Results:  Recent Labs    08/03/17 1910 08/04/17 0256  WBC 6.0 5.1  HGB 14.7 13.1  HCT 42.0 38.6*  PLT 120* 102*   BMET Recent Labs    08/04/17 0256 08/05/17 0559  NA 138 138  K 3.8 3.6  CL 106 104  CO2 23 25  GLUCOSE 107* 114*  BUN 8 9  CREATININE 0.89 0.89  CALCIUM 8.7* 8.7*   PT/INR No results for input(s): LABPROT, INR in the last 72 hours. ABG No results for input(s): PHART, HCO3 in the last 72 hours.  Invalid input(s): PCO2, PO2  Studies/Results: Dg Chest 2 View  Result Date: 08/03/2017 CLINICAL DATA:  Central chest pain for 3 weeks. EXAM: CHEST  2 VIEW COMPARISON:  February 21, 2007 FINDINGS: The heart size and mediastinal contours are within normal limits. Both lungs are clear. The visualized skeletal structures are unremarkable. IMPRESSION: No active cardiopulmonary disease. Electronically Signed   By: Dorise Bullion III M.D   On: 08/03/2017 19:30   US Abdomen Complete  Result Date: 08/04/2017 CLINICAL DATA:  Abdominal pain EXAM: COMPLETE ABDOMINAL  ULTRASOUND COMPARISON:  MR from the same day, CT from previous day FINDINGS: Gallbladder: Gallbladder is distended containing sludge. No wall thickening or pericholecystic fluid. Common bile duct:  Mildly dilated, 8.2 mm diameter. Liver: Homogeneous in echotexture without focal lesion or intrahepatic bile duct dilatation. Antegrade color Doppler signal in the portal vein. IVC:  Negative Pancreas: Visualized segments unremarkable, portions obscured by overlying bowel gas. Spleen: No focal lesion, 16.7 x 17.8 x 7.8 cm (volume = 1200 cm^3). Right Kidney: No hydronephrosis. 3 cm lower pole simple appearing cyst. 0.9 cm shadowing calculus in the upper pole collecting system. 14.5 cm in length. Left Kidney: 15.1 cm in length. No mass or hydronephrosis. 0.6 cm shadowing focus in the lower pole collecting system. Abdominal aorta: Fusiform dilatation up to 2.7 cm diameter in its mid segment. IMPRESSION: 1. Sludge filled gallbladder without other ultrasound evidence of cholecystitis . 2. Splenomegaly 3. Ectatic abdominal aorta at risk for aneurysm development. Recommend followup by ultrasound in 5 years. This recommendation follows ACR consensus guidelines: White Paper of the ACR Incidental Findings Committee II on Vascular Findings. J Am Coll Radiol 2013; 10:789-794. 4. Bilateral nephrolithiasis without hydronephrosis. Electronically Signed   By: Lucrezia Europe M.D.   On: 08/04/2017 10:40   Ct Abdomen Pelvis W Contrast  Result Date: 08/03/2017 CLINICAL DATA:  Lower abdominal pain, back pain, and constipation for 1 month. EXAM: CT ABDOMEN AND PELVIS WITH CONTRAST TECHNIQUE: Multidetector CT imaging of the  abdomen and pelvis was performed using the standard protocol following bolus administration of intravenous contrast. CONTRAST:  100 mL Isovue-300 COMPARISON:  02/10/2007 FINDINGS: Lower chest: Emphysematous changes and atelectasis in the lung bases. Coronary artery calcifications. Hepatobiliary: No focal liver abnormality is  seen. No gallstones, gallbladder wall thickening, or biliary dilatation. Pancreas: There is heterogeneous fullness in the head of the pancreas with stranding around the peripancreatic fat and mild pancreatic ductal dilatation. This could represent focal inflammation or underlying mass lesion. Spleen: Normal in size without focal abnormality. Adrenals/Urinary Tract: No adrenal gland nodules. 8 mm stone in the midportion right kidney. 5 mm stone in the lower pole left kidney. Additional tiny punctate stones in both kidneys. Benign-appearing cyst in the lower pole right kidney. No hydronephrosis or hydroureter. No ureteral stones identified. Bladder wall is not thickened. No bladder stones. Stomach/Bowel: Stomach, small bowel, and colon are not abnormally distended. No inflammatory changes or wall thickening identified. Appendix is not identified. Vascular/Lymphatic: Aortic atherosclerosis. No enlarged abdominal or pelvic lymph nodes. Reproductive: Prostate is unremarkable. Other: No abdominal wall hernia or abnormality. No abdominopelvic ascites. Musculoskeletal: Degenerative changes in the spine. Bridging anterior osteophytes in the lower thoracic and upper lumbar region. No destructive bone lesions. IMPRESSION: 1. Fullness in the head of the pancreas with stranding in the peripancreatic fat and pancreatic ductal dilatation. Consider focal acute pancreatitis versus underlying mass lesion. Laboratory correlation is recommended. Consider follow-up with elective MRI/MRCP to exclude a pancreatic head mass. 2. Multiple bilateral nonobstructing intrarenal stones. No ureteral stone or obstruction. 3. Emphysematous changes and atelectasis in the lung bases. 4. Aortic atherosclerosis. Electronically Signed   By: Lucienne Capers M.D.   On: 08/03/2017 23:58   Mr Abdomen With Mrcp W Contrast  Result Date: 08/04/2017 CLINICAL DATA:  Bloatedness and constipation for the last 2 days. Lower abdominal pain. Abnormal CT  demonstrating probable pancreatitis. EXAM: MRI ABDOMEN WITHOUT CONTRAST  (INCLUDING MRCP) TECHNIQUE: Multiplanar multisequence MR imaging of the abdomen was performed. Heavily T2-weighted images of the biliary and pancreatic ducts were obtained, and three-dimensional MRCP images were rendered by post processing. COMPARISON:  CT of 1 day prior and 02/10/2007. FINDINGS: Portions of exam, especially the MRCP dedicated series, are motion degraded. Lower chest: Mild cardiomegaly, without pericardial or pleural effusion. Hepatobiliary: A 6 mm right liver lobe too small to characterize lesion image 56/series 1412. Intrahepatic ducts are upper normal. Normal gallbladder. The common duct measures 8 mm on image 23/series 5, upper normal for patient age. No choledocholithiasis identified. The common duct is followed to the level of the pancreatic head, where it undergoes a transition to decompressed, felt to be just above the ampulla. Example images 71 through 76/series 1401. Pancreas: Mild pancreatic duct dilatation throughout the body and tail with duct irregularity. Example image 28/series 6. This duct dilatation is followed to the level of an accessory entrance into the duodenum, including image 31/series 6. Within the pancreatic head and uncinate process is an equivocal area of soft tissue fullness, including at on the order of 2.4 cm on image 80/series 1403. This area does demonstrate restricted diffusion on image 53/series 8, but maintains normal precontrast T1 hyperintensity, including on image 86/series 1400. Moderate peripancreatic edema surrounds the head and uncinate process. Example image 32/series 7. No peripancreatic fluid collection. Spleen:  Splenomegaly, 17.8 cm craniocaudal. Adrenals/Urinary Tract: Normal adrenal glands. Normal left kidney. Lower pole right renal 2.8 cm cyst. No hydronephrosis. Stomach/Bowel: Normal stomach and abdominal bowel loops. Vascular/Lymphatic: Normal caliber of the aorta and  branch  vessels. Patent splenic and portal veins. No retroperitoneal or retrocrural adenopathy. Other:  Small volume perihepatic ascites. Musculoskeletal: No acute osseous abnormality. IMPRESSION: 1. Mildly motion degraded exam. 2. Findings which are favored to represent acute on chronic pancreatitis. Peripancreatic edema surrounding the head and uncinate process, superimposed upon mild diffuse pancreatic duct dilatation. Pancreas divisum. 3. Equivocal soft tissue fullness within the head and uncinate process of the pancreas. Suggestion of common duct narrowing in this region. Therefore, concurrent adenocarcinoma canal be excluded. Potential clinical strategies include medical therapy with repeat pre and post contrast abdominal MRI/MRCP at 3-4 weeks versus further characterization with endoscopic ultrasound with special attention to the head and uncinate process. 4. Indeterminate too small to characterize right hepatic lobe lesion. Recommend attention on follow-up. 5. Small volume abdominal ascites. 6. Splenomegaly. Electronically Signed   By: Abigail Miyamoto M.D.   On: 08/04/2017 07:27    Anti-infectives: Anti-infectives (From admission, onward)   None      Assessment/Plan:  Acute pancreatitis, gallbladder sludge, elevated LFTs, question pancreatic head and uncinate mass by MRCP.    Imaging findings are certainly degraded by acute inflammatory changes.  Okay for discharge from surgical standpoint -Low-fat diet -No alcohol  Needs ERCP and EUS with biopsies, if indicated, by Dr. Ardis Hughs as outpatient perhaps 3-4 weeks from now after acute inflammation subsides allowing better imaging  If the endoscopic evaluation is negative for pancreatic neoplasm, then he should be referred back to [facility] surgery for cholecystectomy.-Dr. Kinsinger  If, however, there is a question of pancreatic neoplasm,then the patient should be referred to Dr. Stark Klein at [facility] surgery following ERCP and EUS.    LOS: 1 day    Adin Hector 08/05/2017

## 2017-08-07 ENCOUNTER — Other Ambulatory Visit: Payer: Self-pay

## 2017-08-07 ENCOUNTER — Telehealth: Payer: Self-pay

## 2017-08-07 ENCOUNTER — Encounter (HOSPITAL_COMMUNITY): Payer: Self-pay | Admitting: Emergency Medicine

## 2017-08-07 DIAGNOSIS — R948 Abnormal results of function studies of other organs and systems: Secondary | ICD-10-CM

## 2017-08-07 DIAGNOSIS — R945 Abnormal results of liver function studies: Principal | ICD-10-CM

## 2017-08-07 DIAGNOSIS — R7989 Other specified abnormal findings of blood chemistry: Secondary | ICD-10-CM

## 2017-08-07 NOTE — Telephone Encounter (Signed)
The pt has been scheduled for EUS, ERCP on 08/10/17 at 830

## 2017-08-07 NOTE — Telephone Encounter (Signed)
The pt has been advised and instructed.  He will call with any questions or concerns

## 2017-08-07 NOTE — Telephone Encounter (Signed)
-----   Message from Milus Banister, MD sent at 08/05/2017 10:02 AM EST ----- The EUS actually needs to be done sooner, this coming Thursday (the 7th). EUS + ERCP for abnormal pancreas, elevated liver tests.  Bump all non-EUS cases that are scheduled that day to the following Thursday (14th) to allow time.  This is more urgent.   Thanks; he's expecting a call on Monday about it all.   Also, doesn't need LFTs prior to the Thursday case but could you please tell endo to get a set of hepatic function panel when he shows in admitting.  Thanks again.  Radonna Ricker

## 2017-08-07 NOTE — Telephone Encounter (Signed)
Transition Care Management Follow-up Telephone Call   Date discharged?08/05/17   How have you been since you were released from the hospital? Patient is feeling better.    Do you understand why you were in the hospital? yes   Do you understand the discharge instructions? yes   Where were you discharged to? home   Items Reviewed:  Medications reviewed: yes  Allergies reviewed: yes  Dietary changes reviewed: yes  Referrals reviewed: yes   Functional Questionnaire:   Activities of Daily Living (ADLs):   He states they are independent in the following: ambulation, bathing and hygiene, feeding, continence, grooming, toileting and dressing States they require assistance with the following: none   Any transportation issues/concerns?: no   Any patient concerns? no   Confirmed importance and date/time of follow-up visits scheduled yes  Provider Appointment booked with Cory Kelly on 08/11/17 @ 9:40 am.  Confirmed with patient if condition begins to worsen call PCP or go to the ER.  Patient was given the office number and encouraged to call back with question or concerns.  : yes

## 2017-08-08 ENCOUNTER — Encounter: Payer: Self-pay | Admitting: Gastroenterology

## 2017-08-10 ENCOUNTER — Telehealth: Payer: Self-pay

## 2017-08-10 ENCOUNTER — Other Ambulatory Visit: Payer: Self-pay

## 2017-08-10 ENCOUNTER — Ambulatory Visit (HOSPITAL_COMMUNITY): Payer: BLUE CROSS/BLUE SHIELD | Admitting: Anesthesiology

## 2017-08-10 ENCOUNTER — Ambulatory Visit (HOSPITAL_COMMUNITY)
Admission: RE | Admit: 2017-08-10 | Discharge: 2017-08-10 | Disposition: A | Discharge status: Home / Self Care | Payer: BLUE CROSS/BLUE SHIELD | Source: Ambulatory Visit | Attending: Gastroenterology | Admitting: Gastroenterology

## 2017-08-10 ENCOUNTER — Ambulatory Visit (HOSPITAL_COMMUNITY): Payer: BLUE CROSS/BLUE SHIELD

## 2017-08-10 ENCOUNTER — Encounter (HOSPITAL_COMMUNITY)
Admission: RE | Disposition: A | Discharge status: Home / Self Care | Payer: Self-pay | Source: Ambulatory Visit | Attending: Gastroenterology

## 2017-08-10 ENCOUNTER — Encounter (HOSPITAL_COMMUNITY): Payer: Self-pay

## 2017-08-10 DIAGNOSIS — Z79899 Other long term (current) drug therapy: Secondary | ICD-10-CM | POA: Insufficient documentation

## 2017-08-10 DIAGNOSIS — C61 Malignant neoplasm of prostate: Secondary | ICD-10-CM | POA: Insufficient documentation

## 2017-08-10 DIAGNOSIS — R748 Abnormal levels of other serum enzymes: Secondary | ICD-10-CM | POA: Diagnosis not present

## 2017-08-10 DIAGNOSIS — C250 Malignant neoplasm of head of pancreas: Secondary | ICD-10-CM | POA: Diagnosis not present

## 2017-08-10 DIAGNOSIS — K8689 Other specified diseases of pancreas: Secondary | ICD-10-CM | POA: Diagnosis not present

## 2017-08-10 DIAGNOSIS — I10 Essential (primary) hypertension: Secondary | ICD-10-CM | POA: Insufficient documentation

## 2017-08-10 DIAGNOSIS — R933 Abnormal findings on diagnostic imaging of other parts of digestive tract: Secondary | ICD-10-CM | POA: Diagnosis not present

## 2017-08-10 DIAGNOSIS — E785 Hyperlipidemia, unspecified: Secondary | ICD-10-CM | POA: Diagnosis not present

## 2017-08-10 DIAGNOSIS — R945 Abnormal results of liver function studies: Secondary | ICD-10-CM

## 2017-08-10 DIAGNOSIS — K831 Obstruction of bile duct: Secondary | ICD-10-CM | POA: Insufficient documentation

## 2017-08-10 DIAGNOSIS — Q453 Other congenital malformations of pancreas and pancreatic duct: Secondary | ICD-10-CM

## 2017-08-10 DIAGNOSIS — R948 Abnormal results of function studies of other organs and systems: Secondary | ICD-10-CM

## 2017-08-10 DIAGNOSIS — K859 Acute pancreatitis without necrosis or infection, unspecified: Secondary | ICD-10-CM | POA: Insufficient documentation

## 2017-08-10 DIAGNOSIS — R7989 Other specified abnormal findings of blood chemistry: Secondary | ICD-10-CM

## 2017-08-10 HISTORY — PX: EUS: SHX5427

## 2017-08-10 HISTORY — PX: ENDOSCOPIC RETROGRADE CHOLANGIOPANCREATOGRAPHY (ERCP) WITH PROPOFOL: SHX5810

## 2017-08-10 LAB — HEPATIC FUNCTION PANEL
ALK PHOS: 493 U/L — AB (ref 38–126)
ALT: 741 U/L — ABNORMAL HIGH (ref 17–63)
AST: 426 U/L — ABNORMAL HIGH (ref 15–41)
Albumin: 4.1 g/dL (ref 3.5–5.0)
BILIRUBIN INDIRECT: 2.4 mg/dL — AB (ref 0.3–0.9)
BILIRUBIN TOTAL: 3.3 mg/dL — AB (ref 0.3–1.2)
Bilirubin, Direct: 0.9 mg/dL — ABNORMAL HIGH (ref 0.1–0.5)
Total Protein: 6.8 g/dL (ref 6.5–8.1)

## 2017-08-10 LAB — LIPASE, BLOOD: Lipase: 51 U/L (ref 11–51)

## 2017-08-10 SURGERY — UPPER ENDOSCOPIC ULTRASOUND (EUS) RADIAL
Anesthesia: General

## 2017-08-10 MED ORDER — CIPROFLOXACIN IN D5W 400 MG/200ML IV SOLN
INTRAVENOUS | Status: AC
  Filled 2017-08-10: qty 200

## 2017-08-10 MED ORDER — INDOMETHACIN 50 MG RE SUPP
RECTAL | Status: DC | PRN
  Administered 2017-08-10: 100 mg via RECTAL

## 2017-08-10 MED ORDER — MIDAZOLAM HCL 2 MG/2ML IJ SOLN
INTRAMUSCULAR | Status: AC
  Filled 2017-08-10: qty 2

## 2017-08-10 MED ORDER — FENTANYL CITRATE (PF) 100 MCG/2ML IJ SOLN
INTRAMUSCULAR | Status: AC
Start: 2017-08-10 — End: 2017-08-10
  Filled 2017-08-10: qty 2

## 2017-08-10 MED ORDER — PROPOFOL 10 MG/ML IV BOLUS
INTRAVENOUS | Status: AC
  Filled 2017-08-10: qty 20

## 2017-08-10 MED ORDER — ONDANSETRON HCL 4 MG/2ML IJ SOLN
INTRAMUSCULAR | Status: DC | PRN
  Administered 2017-08-10: 4 mg via INTRAVENOUS

## 2017-08-10 MED ORDER — LACTATED RINGERS IV SOLN
INTRAVENOUS | Status: DC
  Administered 2017-08-10: 11:00:00 via INTRAVENOUS
  Administered 2017-08-10: 1000 mL via INTRAVENOUS

## 2017-08-10 MED ORDER — SUGAMMADEX SODIUM 200 MG/2ML IV SOLN
INTRAVENOUS | Status: DC | PRN
  Administered 2017-08-10: 200 mg via INTRAVENOUS

## 2017-08-10 MED ORDER — FENTANYL CITRATE (PF) 100 MCG/2ML IJ SOLN
INTRAMUSCULAR | Status: AC
  Filled 2017-08-10: qty 2

## 2017-08-10 MED ORDER — LIDOCAINE 2% (20 MG/ML) 5 ML SYRINGE
INTRAMUSCULAR | Status: DC | PRN
  Administered 2017-08-10: 50 mg via INTRAVENOUS

## 2017-08-10 MED ORDER — DEXAMETHASONE SODIUM PHOSPHATE 10 MG/ML IJ SOLN
INTRAMUSCULAR | Status: DC | PRN
  Administered 2017-08-10: 10 mg via INTRAVENOUS

## 2017-08-10 MED ORDER — GLUCAGON HCL RDNA (DIAGNOSTIC) 1 MG IJ SOLR
INTRAMUSCULAR | Status: AC
  Filled 2017-08-10: qty 1

## 2017-08-10 MED ORDER — MIDAZOLAM HCL 5 MG/5ML IJ SOLN
INTRAMUSCULAR | Status: DC | PRN
  Administered 2017-08-10: 2 mg via INTRAVENOUS

## 2017-08-10 MED ORDER — SODIUM CHLORIDE 0.9 % IV SOLN: INTRAVENOUS | Status: DC

## 2017-08-10 MED ORDER — INDOMETHACIN 50 MG RE SUPP
RECTAL | Status: AC
  Filled 2017-08-10: qty 2

## 2017-08-10 MED ORDER — ROCURONIUM BROMIDE 10 MG/ML (PF) SYRINGE
PREFILLED_SYRINGE | INTRAVENOUS | Status: DC | PRN
  Administered 2017-08-10 (×2): 10 mg via INTRAVENOUS
  Administered 2017-08-10: 40 mg via INTRAVENOUS
  Administered 2017-08-10 (×2): 10 mg via INTRAVENOUS

## 2017-08-10 MED ORDER — PROPOFOL 10 MG/ML IV BOLUS
INTRAVENOUS | Status: DC | PRN
  Administered 2017-08-10: 200 mg via INTRAVENOUS

## 2017-08-10 MED ORDER — FENTANYL CITRATE (PF) 100 MCG/2ML IJ SOLN
INTRAMUSCULAR | Status: DC | PRN
  Administered 2017-08-10: 100 ug via INTRAVENOUS
  Administered 2017-08-10 (×3): 50 ug via INTRAVENOUS

## 2017-08-10 NOTE — Anesthesia Procedure Notes (Signed)
Procedure Name: Intubation Date/Time: 08/10/2017 8:42 AM Performed by: Anne Fu, CRNA Pre-anesthesia Checklist: Patient identified, Emergency Drugs available, Suction available, Patient being monitored and Timeout performed Patient Re-evaluated:Patient Re-evaluated prior to induction Oxygen Delivery Method: Circle system utilized Preoxygenation: Pre-oxygenation with 100% oxygen Induction Type: IV induction Ventilation: Mask ventilation without difficulty Laryngoscope Size: Mac and 4 Grade View: Grade II Tube type: Oral Tube size: 7.5 mm Number of attempts: 1 Airway Equipment and Method: Stylet Placement Confirmation: ETT inserted through vocal cords under direct vision,  positive ETCO2 and breath sounds checked- equal and bilateral Secured at: 21 cm Tube secured with: Tape Dental Injury: Teeth and Oropharynx as per pre-operative assessment

## 2017-08-10 NOTE — Op Note (Signed)
[facility] Patient Name: Cory Kelly Procedure Date: 08/10/2017 MRN: [id number] Attending MD: Milus Banister , MD Date of Birth: 1956/07/03 CSN: [id number] Age: 62 Admit Type: Outpatient Procedure:                Upper EUS Indications:              presented with abd pain, weight loss, elevated                            lipase; imaging (CT and MR) suggest acute                            pancreatic inflamation, ?underlying mass, CA 19-9                            977 Providers:                Milus Banister, MD, Vista Lawman, RN, Laurena Spies, Technician, Cletis Athens, Technician Referring MD:              Medicines:                General Anesthesia Complications:            No immediate complications. Estimated blood loss:                            None. Estimated Blood Loss:     Estimated blood loss: none. Procedure:                Pre-Anesthesia Assessment:                           - Prior to the procedure, a History and Physical                            was performed, and patient medications and                            allergies were reviewed. The patient's tolerance of                            previous anesthesia was also reviewed. The risks                            and benefits of the procedure and the sedation                            options and risks were discussed with the patient.                            All questions were answered, and informed consent                            was obtained. Prior Anticoagulants:  The patient has                            taken no previous anticoagulant or antiplatelet                            agents. ASA Grade Assessment: II - A patient with                            mild systemic disease. After reviewing the risks                            and benefits, the patient was deemed in                            satisfactory condition to undergo the procedure.                            After obtaining informed consent, the endoscope was                            passed under direct vision. Throughout the                            procedure, the patient's blood pressure, pulse, and                            oxygen saturations were monitored continuously. The                            [id number] ([id number]) scope was introduced through                            the mouth, and advanced to the second part of                            duodenum. The upper EUS was accomplished without                            difficulty. The patient tolerated the procedure                            well. Scope In: Scope Out: Findings:      Endoscopic Finding :      The examined esophagus was endoscopically normal.      Endosonographic Finding :      1. An irregular mass-like region was identified in the pancreatic head.       This was hypoechoic and heterogenous and measured 27 mm in maximal       cross-sectional diameter. The endosonographic borders were       poorly-defined. The remainder of the pancreas was examined. The       endosonographic appearance of parenchyma and the upstream pancreatic       duct indicated duct dilation. The borders were very poorly defined but  the lesion directly abuts the portal vein with loss of normal tissue       interface for at least 1cm. Fine needle aspiration for cytology was       performed. Color Doppler imaging was utilized prior to needle puncture       to confirm a lack of significant vascular structures within the needle       path. Five passes were made with the 25 gauge needle using a       transduodenal approach. A cytotechnologist was present to evaluate the       adequacy of the specimen.      2. The remaining pancreatic parenchyma was more edematous than is usual       but there were no other mass-like areas.      3. CBD was dilated 8.51mm.      4. Main pancreatic duct was also slightly dilated (42mm in body).       5. No peripancreatic adenopathy.      6. Limited views of the liver, spleen were normal. Impression:               - Pancreatic parenchyma was diffusely abnormal                            (edematous appearing), however there was a soft                            tissue mass-like region in the head of the pancreas                            that measures 2.7cm maximally. This directly abuts                            the main portal vein but no other major vasular                            structures. The CBD and main pancreatic duct were                            both dilated (8.73mm and 18mm respectively).                           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 Moderate Sedation:      N/A- Per Anesthesia Care Recommendation:           - ERCP now for placement of SEMS Procedure Code(s):        --- Professional ---                           [id number], Esophagogastroduodenoscopy, flexible,                            transoral; with transendoscopic ultrasound-guided  intramural or transmural fine needle                            aspiration/biopsy(s), (includes endoscopic                            ultrasound examination limited to the esophagus,                            stomach or duodenum, and adjacent structures) Diagnosis Code(s):        --- Professional ---                           K86.89, Other specified diseases of pancreas                           R93.3, Abnormal findings on diagnostic imaging of                            other parts of digestive tract CPT copyright 2016 American Medical Association. All rights reserved. The codes documented in this report are preliminary and upon coder review may  be revised to meet current compliance requirements. Milus Banister, MD 08/10/2017 10:16:21 AM This report has been signed electronically. Number of Addenda: 0

## 2017-08-10 NOTE — Op Note (Signed)
[facility] Patient Name: Cory Kelly Procedure Date: 08/10/2017 MRN: [id number] Attending MD: Milus Banister , MD Date of Birth: 1956/04/06 CSN: [id number] Age: 62 Admit Type: Outpatient Procedure:                ERCP Indications:              elevated liver tests, mass in head of pancreas, EUS                            just prior to this exam FNA showed malignancy                            (likely adenocarcinoma), dilated bile ducts Providers:                Milus Banister, MD, Vista Lawman, RN, Laurena Spies, Technician, Cletis Athens, Technician Referring MD:              Medicines:                General Anesthesia, Indomethacin 818 mg PR Complications:            No immediate complications. Estimated blood loss:                            None Estimated Blood Loss:     Estimated blood loss: none. Procedure:                Pre-Anesthesia Assessment:                           - Prior to the procedure, a History and Physical                            was performed, and patient medications and                            allergies were reviewed. The patient's tolerance of                            previous anesthesia was also reviewed. The risks                            and benefits of the procedure and the sedation                            options and risks were discussed with the patient.                            All questions were answered, and informed consent                            was obtained. Prior Anticoagulants: The patient has  taken no previous anticoagulant or antiplatelet                            agents. ASA Grade Assessment: II - A patient with                            mild systemic disease. After reviewing the risks                            and benefits, the patient was deemed in                            satisfactory condition to undergo the procedure.       After obtaining informed consent, the scope was                            passed under direct vision. Throughout the                            procedure, the patient's blood pressure, pulse, and                            oxygen saturations were monitored continuously. The                            [id number] scope was introduced through the                            mouth, and used to inject contrast into and used to                            inject contrast into the bile duct. The ERCP was                            technically difficult and complex due to                            challenging cannulation. The patient tolerated the                            procedure well. Scope In: Scope Out: Findings:      The scout film was normal. The esophagus was successfully intubated       under direct vision. The scope was advanced to a normal major papilla in       the descending duodenum without detailed examination of the pharynx,       larynx and associated structures, and upper GI tract. The upper GI tract       was grossly normal. The major papilla was slightly hooded. I was unable       to cannulate the bile duct despite multiple attempts, wire changes. I       was able to briefly cannulate the uncinate branch of the pancreatic       duct, never injected dye. After 60 minute attempt, I elected to abort  the procedure. Impression:               Obstructive jaundice from head of pancreas mass,                            mild acute inflamation. Moderate Sedation:      N/A- Per Anesthesia Care Recommendation:           - Discharge patient to home.                           - My office will arrange out patient ERCP, stent                            placement (preferably SEMS, covered) at nearby                            tertiary center.                           - Will also arrange medical and surgical oncology                            evaluation.                            - Await final pathology from EUS just prior to this                            exam. Procedure Code(s):        --- Professional ---                           [phone], Endoscopic retrograde                            cholangiopancreatography (ERCP); diagnostic,                            including collection of specimen(s) by brushing or                            washing, when performed (separate procedure) Diagnosis Code(s):        --- Professional ---                           R74.8, Abnormal levels of other serum enzymes CPT copyright 2016 American Medical Association. All rights reserved. The codes documented in this report are preliminary and upon coder review may  be revised to meet current compliance requirements. Milus Banister, MD 08/10/2017 10:56:17 AM This report has been signed electronically. Number of Addenda: 0

## 2017-08-10 NOTE — Discharge Instructions (Signed)

## 2017-08-10 NOTE — Anesthesia Preprocedure Evaluation (Signed)
Anesthesia Evaluation  Patient identified by MRN, date of birth, ID band Patient awake    Reviewed: Allergy & Precautions, NPO status , Patient's Chart, lab work & pertinent test results  Airway Mallampati: I  TM Distance: >3 FB Neck ROM: Full    Dental   Pulmonary    Pulmonary exam normal        Cardiovascular hypertension, Pt. on medications Normal cardiovascular exam     Neuro/Psych    GI/Hepatic   Endo/Other    Renal/GU      Musculoskeletal   Abdominal   Peds  Hematology   Anesthesia Other Findings   Reproductive/Obstetrics                             Anesthesia Physical Anesthesia Plan  ASA: II  Anesthesia Plan: General   Post-op Pain Management:    Induction: Intravenous  PONV Risk Score and Plan: 2  Airway Management Planned: Oral ETT  Additional Equipment:   Intra-op Plan:   Post-operative Plan: Extubation in OR  Informed Consent: I have reviewed the patients History and Physical, chart, labs and discussed the procedure including the risks, benefits and alternatives for the proposed anesthesia with the patient or authorized representative who has indicated his/her understanding and acceptance.       Plan Discussed with: CRNA and Surgeon  Anesthesia Plan Comments:         Anesthesia Quick Evaluation  

## 2017-08-10 NOTE — Anesthesia Postprocedure Evaluation (Signed)
Anesthesia Post Note  Patient: Cory Kelly  Procedure(s) Performed: UPPER ENDOSCOPIC ULTRASOUND (EUS) RADIAL (N/A ) ENDOSCOPIC RETROGRADE CHOLANGIOPANCREATOGRAPHY (ERCP) WITH PROPOFOL (N/A )     Patient location during evaluation: PACU Anesthesia Type: General Level of consciousness: awake and alert Pain management: pain level controlled Vital Signs Assessment: post-procedure vital signs reviewed and stable Respiratory status: spontaneous breathing, nonlabored ventilation, respiratory function stable and patient connected to nasal cannula oxygen Cardiovascular status: blood pressure returned to baseline and stable Postop Assessment: no apparent nausea or vomiting Anesthetic complications: no    Last Vitals:  Vitals:   08/10/17 1130 08/10/17 1140  BP: (!) 184/98 (!) 165/89  Pulse: (!) 56 (!) 57  Resp: 14 11  Temp:    SpO2: 99% 99%    Last Pain:  Vitals:   08/10/17 1105  TempSrc: Oral                 Dynastee Brummell DAVID

## 2017-08-10 NOTE — Telephone Encounter (Signed)
Referrals made as ordered

## 2017-08-10 NOTE — Interval H&P Note (Signed)
History and Physical Interval Note:  08/10/2017 7:46 AM  Cory Kelly  has presented today for surgery, with the diagnosis of abnormal pancreas, elevated LFT  The various methods of treatment have been discussed with the patient and family. After consideration of risks, benefits and other options for treatment, the patient has consented to  Procedure(s): UPPER ENDOSCOPIC ULTRASOUND (EUS) RADIAL (N/A) ENDOSCOPIC RETROGRADE CHOLANGIOPANCREATOGRAPHY (ERCP) WITH PROPOFOL (N/A) as a surgical intervention .  The patient's history has been reviewed, patient examined, no change in status, stable for surgery.  I have reviewed the patient's chart and labs.  Questions were answered to the patient's satisfaction.     Milus Banister

## 2017-08-10 NOTE — Transfer of Care (Signed)
Immediate Anesthesia Transfer of Care Note  Patient: Cory Kelly  Procedure(s) Performed: UPPER ENDOSCOPIC ULTRASOUND (EUS) RADIAL (N/A ) ENDOSCOPIC RETROGRADE CHOLANGIOPANCREATOGRAPHY (ERCP) WITH PROPOFOL (N/A )  Patient Location: PACU  Anesthesia Type:General  Level of Consciousness: awake, alert  and oriented  Airway & Oxygen Therapy: Patient Spontanous Breathing and Patient connected to face mask oxygen  Post-op Assessment: Report given to RN and Post -op Vital signs reviewed and stable  Post vital signs: Reviewed and stable  Last Vitals:  Vitals:   08/10/17 0755 08/10/17 1105  BP: (!) 160/87 (!) 163/92  Pulse: (!) 59 70  Resp: 18 (!) 9  Temp: 36.8 C   SpO2: 97% 100%    Last Pain:  Vitals:   08/10/17 1105  TempSrc: Oral         Complications: No apparent anesthesia complications

## 2017-08-10 NOTE — Telephone Encounter (Signed)
-----   Message from Cory Banister, MD sent at 08/10/2017 10:57 AM EST ----- Cory Kelly, He needs referrals to medical oncology for newly diagnosed pancreatic cancer (likely adenocarcinoma).  By EUS and CT he is borderline-resectable (CA 19-9 977 and so may be unresectable in fact).    He needs referral to Cory Kelly at [facility], can you please begin expedited referral to [facility] Cory Kelly for "failed biliary cannulation in setting of obstructing mass in head of pancreas, Tbili 3 so not emergent but hopefully in next 7-10 days for stenting"  I am happy to talk with Cory Kelly if needed, questions, please give them my direct pager information.  Cory Kelly, can you put him on for next GI cancer conference.    Thanks ALL.

## 2017-08-11 ENCOUNTER — Telehealth: Payer: Self-pay | Admitting: Gastroenterology

## 2017-08-11 ENCOUNTER — Telehealth: Payer: Self-pay | Admitting: Nurse Practitioner

## 2017-08-11 ENCOUNTER — Encounter: Payer: Self-pay | Admitting: Nurse Practitioner

## 2017-08-11 ENCOUNTER — Ambulatory Visit: Payer: BLUE CROSS/BLUE SHIELD | Admitting: Physician Assistant

## 2017-08-11 ENCOUNTER — Encounter (HOSPITAL_COMMUNITY): Payer: Self-pay | Admitting: Gastroenterology

## 2017-08-11 NOTE — Telephone Encounter (Signed)
We spoke, she is trying to decide where she wants him to get his care.  She is going to call back later today with some more final decisions.

## 2017-08-11 NOTE — Telephone Encounter (Signed)
Pt has been scheduled to see Ned Card, NP/Dr. Benay Spice on 2/11 at 10:15am. The pt is aware to arrive 30 minutes early. Appointment information has been emailed to the pt.

## 2017-08-11 NOTE — Telephone Encounter (Signed)
Patient wife states they would like Dr.Jacobs to refer patient to Dr.Branch at [facility] for stint placement.

## 2017-08-11 NOTE — Telephone Encounter (Signed)
Dr Ardis Hughs the pt and his wife would like you to call them personally to speak about the pancreatic cancer.

## 2017-08-11 NOTE — Telephone Encounter (Signed)
Patient wife wants to know if they can speak with Dr.Jacobs about pt pancreatic cancer and get some direction from him personally.

## 2017-08-11 NOTE — Telephone Encounter (Signed)
Appt has been scheduled for the pt to see Cira Rue and Dr. Burr Medico on 2/18 at 2pm. P has agreed to the appt date and time. Letter mailed.

## 2017-08-11 NOTE — Telephone Encounter (Signed)
That is OK with me. So please do not refer him to Dr. Delrae Alfred at [place] and instead referral to Dr. [name] at [facility]  "needs biliary stent for obstructing pancreatic tumor, failed ERCP here in [place]".  Hope that this could be done within 7-10 days or so.   Thanks

## 2017-08-11 NOTE — Telephone Encounter (Signed)
The pt states they have a cousin who is a NP at [facility] and would like to have referral made to Dr Harl Bowie.  Is this ok?  They do want to thank you for all your help.

## 2017-08-14 ENCOUNTER — Encounter: Payer: Self-pay | Admitting: Nurse Practitioner

## 2017-08-14 ENCOUNTER — Telehealth: Payer: Self-pay

## 2017-08-14 ENCOUNTER — Inpatient Hospital Stay: Payer: BLUE CROSS/BLUE SHIELD | Attending: Nurse Practitioner | Admitting: Nurse Practitioner

## 2017-08-14 ENCOUNTER — Telehealth: Payer: Self-pay | Admitting: Gastroenterology

## 2017-08-14 VITALS — BP 138/78 | HR 58 | Temp 97.8°F | Resp 18 | Ht 76.5 in | Wt 233.1 lb

## 2017-08-14 DIAGNOSIS — R970 Elevated carcinoembryonic antigen [CEA]: Secondary | ICD-10-CM | POA: Insufficient documentation

## 2017-08-14 DIAGNOSIS — D72829 Elevated white blood cell count, unspecified: Secondary | ICD-10-CM | POA: Insufficient documentation

## 2017-08-14 DIAGNOSIS — Z800 Family history of malignant neoplasm of digestive organs: Secondary | ICD-10-CM | POA: Diagnosis not present

## 2017-08-14 DIAGNOSIS — I10 Essential (primary) hypertension: Secondary | ICD-10-CM | POA: Diagnosis not present

## 2017-08-14 DIAGNOSIS — Z5111 Encounter for antineoplastic chemotherapy: Secondary | ICD-10-CM | POA: Insufficient documentation

## 2017-08-14 DIAGNOSIS — C250 Malignant neoplasm of head of pancreas: Secondary | ICD-10-CM | POA: Insufficient documentation

## 2017-08-14 DIAGNOSIS — K831 Obstruction of bile duct: Secondary | ICD-10-CM

## 2017-08-14 DIAGNOSIS — R17 Unspecified jaundice: Secondary | ICD-10-CM | POA: Insufficient documentation

## 2017-08-14 DIAGNOSIS — D696 Thrombocytopenia, unspecified: Secondary | ICD-10-CM | POA: Insufficient documentation

## 2017-08-14 NOTE — Telephone Encounter (Signed)
The pt is aware that the records were sent and he will call with any further concerns

## 2017-08-14 NOTE — Progress Notes (Addendum)
New Hematology/Oncology Consult   Referral MD:  [phone]      Reason for Referral: Pancreas cancer  HPI: Cory Kelly is a 62 year old man who was hospitalized 08/03/2017 and through 08/05/2017 with acute pancreatitis.  He presented with an approximate 1 month history of intermittent abdominal discomfort, bloating and constipation.  The pain worsened/persisted prompting evaluation in the emergency department.  Liver enzymes were noted to be elevated.  CT abdomen/pelvis 08/03/2017 showed fullness in the head of the pancreas with stranding in the peripancreatic fat and pancreatic ductal dilatation.  MRI abdomen on 08/04/2017 showed findings favored to represent acute on chronic pancreatitis; equivocal soft tissue fullness within the head and uncinate process of the pancreas; indeterminate too small to characterize right hepatic lobe lesion; small volume abdominal ascites; splenomegaly.  CA-19-9 was elevated at 977.  On 08/10/2017 he underwent an upper EUS by Dr. Ardis Hughs with findings of an irregular masslike region in the pancreatic head measuring approximately 2.7 cm.  This directly abutted the main portal vein but no other major vascular structures.  Biopsies obtained with preliminary cytology positive for malignancy, likely adenocarcinoma.  The final report is pending.  The common bile duct and main pancreatic duct were both dilated.  ERCP was then proceeded with for stent placement.  Multiple attempts were made to cannulate the bile duct without success.  The procedure was aborted.  He is being referred to a tertiary center.    Past Medical History:  Diagnosis Date  . Abdominal pain    due to bloating  . Bloating   . Cancer ([place])    skin cancer  . Essential hypertension   . Fatigue   . History of weight loss   . HOH (hard of hearing)    no hearing aids  . Stress    loss of father in Dec 2018.  :  Past Surgical History:  Procedure Laterality Date  . APPENDECTOMY    . ENDOSCOPIC  RETROGRADE CHOLANGIOPANCREATOGRAPHY (ERCP) WITH PROPOFOL N/A 08/10/2017   Procedure: ENDOSCOPIC RETROGRADE CHOLANGIOPANCREATOGRAPHY (ERCP) WITH PROPOFOL;  Surgeon: Milus Banister, MD;  Location: [facility] ENDOSCOPY;  Service: Endoscopy;  Laterality: N/A;  . EUS N/A 08/10/2017   Procedure: UPPER ENDOSCOPIC ULTRASOUND (EUS) RADIAL;  Surgeon: Milus Banister, MD;  Location: [facility] ENDOSCOPY;  Service: Endoscopy;  Laterality: N/A;  . EYE SURGERY     lasik/left eye  . SKIN CANCER EXCISION    :   Current Outpatient Medications:  .  losartan (COZAAR) 50 MG tablet, Take 1 tablet (50 mg total) by mouth daily., Disp: 30 tablet, Rfl: 3:  :  No Known Allergies:  FH: Father deceased with dementia, heart problems; mother with memory problems, hypertension; sister living; maternal uncle with colon cancer.  SOCIAL HISTORY: He lives in [place]. He is married.  He has a daughter.  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  No tobacco or alcohol use.  He has never had a blood transfusion.  Review of Systems: 1 month history of intermittent abdominal discomfort, bloating, constipation.  Initially this occurred every few days.  The pain increased and persisted recently prompting evaluation in the emergency room.  He estimates 15-20 pounds of weight loss over the past few months.  Recent decrease in appetite.  No fevers or sweats.  No bleeding.  No unusual headaches.  No vision change.  No shortness of breath.  No cough.  No chest pain.  No dysphagia.  No nausea or vomiting.  No urinary symptoms.  He occasionally notes numbness over the soles of his feet.   Physical Exam:  Blood pressure 138/78, pulse (!) 58, temperature 97.8 F (36.6 C), temperature source Oral, resp. rate 18, height 6' 4.5" (1.943 m), weight 233 lb 1.6 oz (105.7 kg), SpO2 100 %.  HEENT: Mild scleral icterus.  Oropharynx without thrush or ulceration. Lungs: Lungs clear bilaterally.  No wheezes or rales. Cardiac: Regular rate  and rhythm. Abdomen: Abdomen soft and nontender.  No hepatomegaly.  No mass. Vascular: No leg edema.  Calves soft and nontender. Lymph nodes: No palpable cervical, supra clavicular, axillary or inguinal lymph nodes. Neurologic: Alert and oriented.  Follows commands.  Moves all extremities. Skin: Acne-like rash mid chest.  Skin with mild jaundice.   LABS:  08/04/2017 CA-19-9    977   RADIOLOGY:  Dg Chest 2 View  Result Date: 08/03/2017 CLINICAL DATA:  Central chest pain for 3 weeks. EXAM: CHEST  2 VIEW COMPARISON:  February 21, 2007 FINDINGS: The heart size and mediastinal contours are within normal limits. Both lungs are clear. The visualized skeletal structures are unremarkable. IMPRESSION: No active cardiopulmonary disease. Electronically Signed   By: Dorise Bullion III M.D   On: 08/03/2017 19:30   US Abdomen Complete  Result Date: 08/04/2017 CLINICAL DATA:  Abdominal pain EXAM: COMPLETE ABDOMINAL ULTRASOUND COMPARISON:  MR from the same day, CT from previous day FINDINGS: Gallbladder: Gallbladder is distended containing sludge. No wall thickening or pericholecystic fluid. Common bile duct:  Mildly dilated, 8.2 mm diameter. Liver: Homogeneous in echotexture without focal lesion or intrahepatic bile duct dilatation. Antegrade color Doppler signal in the portal vein. IVC:  Negative Pancreas: Visualized segments unremarkable, portions obscured by overlying bowel gas. Spleen: No focal lesion, 16.7 x 17.8 x 7.8 cm (volume = 1200 cm^3). Right Kidney: No hydronephrosis. 3 cm lower pole simple appearing cyst. 0.9 cm shadowing calculus in the upper pole collecting system. 14.5 cm in length. Left Kidney: 15.1 cm in length. No mass or hydronephrosis. 0.6 cm shadowing focus in the lower pole collecting system. Abdominal aorta: Fusiform dilatation up to 2.7 cm diameter in its mid segment. IMPRESSION: 1. Sludge filled gallbladder without other ultrasound evidence of cholecystitis . 2. Splenomegaly 3. Ectatic  abdominal aorta at risk for aneurysm development. Recommend followup by ultrasound in 5 years. This recommendation follows ACR consensus guidelines: White Paper of the ACR Incidental Findings Committee II on Vascular Findings. J Am Coll Radiol 2013; 10:789-794. 4. Bilateral nephrolithiasis without hydronephrosis. Electronically Signed   By: Lucrezia Europe M.D.   On: 08/04/2017 10:40   Ct Abdomen Pelvis W Contrast  Result Date: 08/03/2017 CLINICAL DATA:  Lower abdominal pain, back pain, and constipation for 1 month. EXAM: CT ABDOMEN AND PELVIS WITH CONTRAST TECHNIQUE: Multidetector CT imaging of the abdomen and pelvis was performed using the standard protocol following bolus administration of intravenous contrast. CONTRAST:  100 mL Isovue-300 COMPARISON:  02/10/2007 FINDINGS: Lower chest: Emphysematous changes and atelectasis in the lung bases. Coronary artery calcifications. Hepatobiliary: No focal liver abnormality is seen. No gallstones, gallbladder wall thickening, or biliary dilatation. Pancreas: There is heterogeneous fullness in the head of the pancreas with stranding around the peripancreatic fat and mild pancreatic ductal dilatation. This could represent focal inflammation or underlying mass lesion. Spleen: Normal in size without focal abnormality. Adrenals/Urinary Tract: No adrenal gland nodules. 8 mm stone in the midportion right kidney. 5 mm stone in the lower pole left kidney. Additional tiny punctate stones in both kidneys. Benign-appearing cyst in the lower  pole right kidney. No hydronephrosis or hydroureter. No ureteral stones identified. Bladder wall is not thickened. No bladder stones. Stomach/Bowel: Stomach, small bowel, and colon are not abnormally distended. No inflammatory changes or wall thickening identified. Appendix is not identified. Vascular/Lymphatic: Aortic atherosclerosis. No enlarged abdominal or pelvic lymph nodes. Reproductive: Prostate is unremarkable. Other: No abdominal wall  hernia or abnormality. No abdominopelvic ascites. Musculoskeletal: Degenerative changes in the spine. Bridging anterior osteophytes in the lower thoracic and upper lumbar region. No destructive bone lesions. IMPRESSION: 1. Fullness in the head of the pancreas with stranding in the peripancreatic fat and pancreatic ductal dilatation. Consider focal acute pancreatitis versus underlying mass lesion. Laboratory correlation is recommended. Consider follow-up with elective MRI/MRCP to exclude a pancreatic head mass. 2. Multiple bilateral nonobstructing intrarenal stones. No ureteral stone or obstruction. 3. Emphysematous changes and atelectasis in the lung bases. 4. Aortic atherosclerosis. Electronically Signed   By: Lucienne Capers M.D.   On: 08/03/2017 23:58   Dg C-arm 1-60 Min-no Report  Result Date: 08/10/2017 Fluoroscopy was utilized by the requesting physician.  No radiographic interpretation.   Mr Abdomen With Mrcp W Contrast  Result Date: 08/04/2017 CLINICAL DATA:  Bloatedness and constipation for the last 2 days. Lower abdominal pain. Abnormal CT demonstrating probable pancreatitis. EXAM: MRI ABDOMEN WITHOUT CONTRAST  (INCLUDING MRCP) TECHNIQUE: Multiplanar multisequence MR imaging of the abdomen was performed. Heavily T2-weighted images of the biliary and pancreatic ducts were obtained, and three-dimensional MRCP images were rendered by post processing. COMPARISON:  CT of 1 day prior and 02/10/2007. FINDINGS: Portions of exam, especially the MRCP dedicated series, are motion degraded. Lower chest: Mild cardiomegaly, without pericardial or pleural effusion. Hepatobiliary: A 6 mm right liver lobe too small to characterize lesion image 56/series 1412. Intrahepatic ducts are upper normal. Normal gallbladder. The common duct measures 8 mm on image 23/series 5, upper normal for patient age. No choledocholithiasis identified. The common duct is followed to the level of the pancreatic head, where it undergoes a  transition to decompressed, felt to be just above the ampulla. Example images 71 through 76/series 1401. Pancreas: Mild pancreatic duct dilatation throughout the body and tail with duct irregularity. Example image 28/series 6. This duct dilatation is followed to the level of an accessory entrance into the duodenum, including image 31/series 6. Within the pancreatic head and uncinate process is an equivocal area of soft tissue fullness, including at on the order of 2.4 cm on image 80/series 1403. This area does demonstrate restricted diffusion on image 53/series 8, but maintains normal precontrast T1 hyperintensity, including on image 86/series 1400. Moderate peripancreatic edema surrounds the head and uncinate process. Example image 32/series 7. No peripancreatic fluid collection. Spleen:  Splenomegaly, 17.8 cm craniocaudal. Adrenals/Urinary Tract: Normal adrenal glands. Normal left kidney. Lower pole right renal 2.8 cm cyst. No hydronephrosis. Stomach/Bowel: Normal stomach and abdominal bowel loops. Vascular/Lymphatic: Normal caliber of the aorta and branch vessels. Patent splenic and portal veins. No retroperitoneal or retrocrural adenopathy. Other:  Small volume perihepatic ascites. Musculoskeletal: No acute osseous abnormality. IMPRESSION: 1. Mildly motion degraded exam. 2. Findings which are favored to represent acute on chronic pancreatitis. Peripancreatic edema surrounding the head and uncinate process, superimposed upon mild diffuse pancreatic duct dilatation. Pancreas divisum. 3. Equivocal soft tissue fullness within the head and uncinate process of the pancreas. Suggestion of common duct narrowing in this region. Therefore, concurrent adenocarcinoma canal be excluded. Potential clinical strategies include medical therapy with repeat pre and post contrast abdominal MRI/MRCP at 3-4 weeks versus  further characterization with endoscopic ultrasound with special attention to the head and uncinate process. 4.  Indeterminate too small to characterize right hepatic lobe lesion. Recommend attention on follow-up. 5. Small volume abdominal ascites. 6. Splenomegaly. Electronically Signed   By: Abigail Miyamoto M.D.   On: 08/04/2017 07:27    Assessment and Plan:   1. Pancreas head mass  CT abdomen/pelvis 08/03/2017-fullness in the head of the pancreas with stranding in the peripancreatic fat and pancreatic ductal dilatation.    MRI abdomen 08/04/2017-findings favored to represent acute on chronic pancreatitis; equivocal soft tissue fullness within the head and uncinate process of the pancreas; indeterminate too small to characterize right hepatic lobe lesion; small volume abdominal ascites; splenomegaly.    CA-19-9 elevated at 977 on 08/04/2017.  Status post upper EUS 08/10/2017-findings of an irregular masslike region in the pancreatic head measuring approximately 2.7 cm.  This directly abutted the main portal vein but no other major vascular structures.  Biopsies obtained with preliminary cytology positive for malignancy, likely adenocarcinoma. The final report is pending.  The common bile duct and main pancreatic duct were both dilated.  ERCP was then proceeded with for stent placement.  Multiple attempts were made to cannulate the bile duct without success.  The procedure was aborted.  He is being referred to a tertiary center. 2. Hypertension  Disposition: Cory Kelly is a 62 year old man recently found to have a pancreas head mass.  Preliminary on the biopsy indicates malignancy, likely adenocarcinoma.  MRI indicates borderline resectable disease.  Dr. Benay Spice reviewed these findings with Cory Kelly and his wife at today's visit and recommends proceeding with neoadjuvant FOLFIRINOX.  They are in agreement with this plan.  They understand the biliary obstruction needs to be relieved prior to beginning chemotherapy.  He has been referred to [facility] for stent placement.  Once the stent has been placed the plan is to begin  FOLFIRINOX.  We will make arrangements for this once he has an established appointment at [facility].  Patient seen with Dr. Benay Spice.    Ned Card, NP 08/14/2017, 10:14 AM   This was a shared visit with Ned Card.  Cory Kelly was interviewed and examined.  I reviewed the CT images and EUS report.  Preliminary review of the cytology from the pancreas head mass biopsy is consistent with adenocarcinoma.  Cory Kelly appears to have borderline resectable disease based on the staging evaluation to date.  I will recommend neoadjuvant FOLFIRINOX.  He has hyperbilirubinemia and is being referred for placement of a bile duct stent.  The markedly elevated CA 19-9 may be in part secondary to bile duct obstruction.  We will see him after a stent is in place to discuss the details of FOLFIRINOX therapy.  I will ask Dr. Barry Dienes to review the imaging studies to see if she agrees with the borderline resectable staging.  Julieanne Manson, MD

## 2017-08-14 NOTE — Telephone Encounter (Signed)
Cory Banister, MD  Jeoffrey Massed, RN        They decided to go ahead with [name] ([facility]) for help with ERCP, stent of bile duct. FYI   thanks

## 2017-08-14 NOTE — Telephone Encounter (Signed)
The pt referral has been made for [facility] and records were sent.  Also, [facility] confirmed they have the referral and will call the pt today. Message left for the pt to advise him the referrals were made.

## 2017-08-14 NOTE — Telephone Encounter (Signed)
See phone note

## 2017-08-14 NOTE — Progress Notes (Signed)
  Oncology Nurse Navigator Documentation  Navigator Location: [facility] (08/14/17 1100)   )Navigator Encounter Type: Initial MedOnc (08/14/17 1100)   Abnormal Finding Date: 08/04/17 (08/14/17 1100) Confirmed Diagnosis Date: 08/10/17 (08/14/17 1100)                   Barriers/Navigation Needs: No barriers at this time (08/14/17 1100)   Interventions: Other(Facilitate imaging scan to [facility]) (08/14/17 1100)    Met with patient and wife during initial med-onc appointment. No barriers to treatment identified. I spoke directly with Margit Hanks in radiology to have images from 08/03/17 to 08/10/17 loaded in to [employer] for  referral to [facility] for stent placement.        Acuity: Level 2 (08/14/17 1100)   Acuity Level 2: Assistance expediting appointments (08/14/17 1100)     Time Spent with Patient: 30 (08/14/17 1100)

## 2017-08-14 NOTE — Progress Notes (Signed)
  Oncology Nurse Navigator Documentation  Navigator Location: [facility] (08/14/17 8115) Referral date to RadOnc/MedOnc: 08/11/17 (08/14/17 7262) )Navigator Encounter Type: Introductory phone call (08/14/17 0355)                       Treatment Phase: Pre-Tx/Tx Discussion (08/14/17 9741)     Interventions: Psycho-social support (08/14/17 6384)    Called patient to introduce myself and my role as GI Navigator. I spoke with patient's wife and encouraged her to call with questions or concerns. She shared that they may be seeking treatment at [facility] but would like to come in for the consult today.        Acuity: Level 1 (08/14/17 0927) Acuity Level 1: Initial guidance, education and coordination as needed (08/14/17 5364)       Time Spent with Patient: 15 (08/14/17 6803)

## 2017-08-15 NOTE — Progress Notes (Signed)
  Oncology Nurse Navigator Documentation  Navigator Location: [facility] (08/15/17 1608)   )Navigator Encounter Type: Letter/Fax/Email;Telephone (08/15/17 1608) Telephone: Incoming Call (08/15/17 1608)                     Treatment Phase: Pre-Tx/Tx Discussion (08/15/17 1608) Barriers/Navigation Needs: Coordination of Care (08/15/17 1608)   Interventions: Referrals;Coordination of Care (08/15/17 1608) Referrals: Other([facility]) (08/15/17 1608) Coordination of Care: Other(Faxed Final Pathology Results 08/10/17 to [facility]) (08/15/17 1608)   Cory Kelly called to inform that patient has an appointment for his procedure at [facility] on 08/16/17 and requested that I fax copy of Pathology Report from 08/10/17 to [name]. Path faxed.     Acuity: Level 2 (08/15/17 1608)         Time Spent with Patient: 15 (08/15/17 1608)

## 2017-08-16 ENCOUNTER — Telehealth: Payer: Self-pay

## 2017-08-16 DIAGNOSIS — C259 Malignant neoplasm of pancreas, unspecified: Secondary | ICD-10-CM | POA: Diagnosis not present

## 2017-08-16 DIAGNOSIS — R945 Abnormal results of liver function studies: Secondary | ICD-10-CM | POA: Diagnosis not present

## 2017-08-16 DIAGNOSIS — K831 Obstruction of bile duct: Secondary | ICD-10-CM | POA: Diagnosis not present

## 2017-08-16 DIAGNOSIS — R17 Unspecified jaundice: Secondary | ICD-10-CM | POA: Diagnosis not present

## 2017-08-16 DIAGNOSIS — Z9889 Other specified postprocedural states: Secondary | ICD-10-CM | POA: Diagnosis not present

## 2017-08-16 DIAGNOSIS — Z4659 Encounter for fitting and adjustment of other gastrointestinal appliance and device: Secondary | ICD-10-CM | POA: Diagnosis not present

## 2017-08-16 DIAGNOSIS — Z79899 Other long term (current) drug therapy: Secondary | ICD-10-CM | POA: Diagnosis not present

## 2017-08-16 NOTE — Telephone Encounter (Signed)
Received call from [name] regarding faxing path report to Dr. Francella Solian. Spoke with [facility] and received fax number for Dr. Francella Solian and re-faxed path report for procedure today.

## 2017-08-17 ENCOUNTER — Other Ambulatory Visit: Payer: Self-pay | Admitting: Nurse Practitioner

## 2017-08-17 DIAGNOSIS — C250 Malignant neoplasm of head of pancreas: Secondary | ICD-10-CM

## 2017-08-18 DIAGNOSIS — C259 Malignant neoplasm of pancreas, unspecified: Secondary | ICD-10-CM | POA: Diagnosis not present

## 2017-08-18 DIAGNOSIS — C250 Malignant neoplasm of head of pancreas: Secondary | ICD-10-CM | POA: Diagnosis not present

## 2017-08-18 NOTE — Progress Notes (Signed)
  Oncology Nurse Navigator Documentation  Navigator Location: [facility] (08/18/17 1324)   )Navigator Encounter Type: Telephone (08/18/17 1324) Telephone: Incoming Call;Outgoing Call (08/18/17 1324)                           Interventions: Education;Psycho-social support (08/18/17 1324)    Called patient to confirm med/onc appointment for 08/21/17. Patient and wife encouraged to call with questions or concerns.        Acuity: Level 2 (08/18/17 1324)   Acuity Level 2: Educational needs;Ongoing guidance and education throughout treatment as needed (08/18/17 1324)     Time Spent with Patient: 15 (08/18/17 1324)

## 2017-08-21 ENCOUNTER — Telehealth: Payer: Self-pay | Admitting: Emergency Medicine

## 2017-08-21 ENCOUNTER — Ambulatory Visit: Payer: BLUE CROSS/BLUE SHIELD | Admitting: Nurse Practitioner

## 2017-08-21 ENCOUNTER — Inpatient Hospital Stay (HOSPITAL_BASED_OUTPATIENT_CLINIC_OR_DEPARTMENT_OTHER): Payer: BLUE CROSS/BLUE SHIELD | Admitting: Nurse Practitioner

## 2017-08-21 ENCOUNTER — Inpatient Hospital Stay: Payer: BLUE CROSS/BLUE SHIELD

## 2017-08-21 VITALS — BP 145/76 | HR 64 | Temp 98.1°F | Resp 18 | Ht 76.5 in | Wt 231.8 lb

## 2017-08-21 DIAGNOSIS — I10 Essential (primary) hypertension: Secondary | ICD-10-CM | POA: Diagnosis not present

## 2017-08-21 DIAGNOSIS — C250 Malignant neoplasm of head of pancreas: Secondary | ICD-10-CM

## 2017-08-21 DIAGNOSIS — Z5111 Encounter for antineoplastic chemotherapy: Secondary | ICD-10-CM | POA: Diagnosis not present

## 2017-08-21 DIAGNOSIS — R17 Unspecified jaundice: Secondary | ICD-10-CM | POA: Diagnosis not present

## 2017-08-21 DIAGNOSIS — D72829 Elevated white blood cell count, unspecified: Secondary | ICD-10-CM | POA: Diagnosis not present

## 2017-08-21 DIAGNOSIS — D696 Thrombocytopenia, unspecified: Secondary | ICD-10-CM | POA: Diagnosis not present

## 2017-08-21 LAB — CMP (CANCER CENTER ONLY)
ALT: 447 U/L — AB (ref 0–55)
AST: 68 U/L — ABNORMAL HIGH (ref 5–34)
Albumin: 3.3 g/dL — ABNORMAL LOW (ref 3.5–5.0)
Alkaline Phosphatase: 553 U/L — ABNORMAL HIGH (ref 40–150)
Anion gap: 10 (ref 3–11)
BUN: 12 mg/dL (ref 7–26)
CO2: 29 mmol/L (ref 22–29)
Calcium: 9.1 mg/dL (ref 8.4–10.4)
Chloride: 101 mmol/L (ref 98–109)
Creatinine: 1.05 mg/dL (ref 0.70–1.30)
Glucose, Bld: 157 mg/dL — ABNORMAL HIGH (ref 70–140)
POTASSIUM: 3.6 mmol/L (ref 3.5–5.1)
SODIUM: 140 mmol/L (ref 136–145)
Total Bilirubin: 1.7 mg/dL — ABNORMAL HIGH (ref 0.2–1.2)
Total Protein: 6.1 g/dL — ABNORMAL LOW (ref 6.4–8.3)

## 2017-08-21 MED ORDER — TRAMADOL HCL 50 MG PO TABS: 50.0000 mg | ORAL_TABLET | Freq: Three times a day (TID) | ORAL | 0 refills | Status: DC | PRN

## 2017-08-21 MED ORDER — PROCHLORPERAZINE MALEATE 10 MG PO TABS: 10.0000 mg | ORAL_TABLET | Freq: Four times a day (QID) | ORAL | 2 refills | Status: DC | PRN

## 2017-08-21 MED ORDER — LIDOCAINE-PRILOCAINE 2.5-2.5 % EX CREA: 1.0000 "application " | TOPICAL_CREAM | CUTANEOUS | 2 refills | Status: DC | PRN

## 2017-08-21 NOTE — Progress Notes (Signed)
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 AJCC N Category: Staged < 8th Ed. AJCC M Category: Staged < 8th Ed. AJCC 8 Stage Grouping: Staged < 8th Ed. Intent of Therapy: Curative Intent, Discussed with Patient 

## 2017-08-21 NOTE — Telephone Encounter (Signed)
Panic ALT of 447 reported to Ned Card, NP.

## 2017-08-21 NOTE — Progress Notes (Addendum)
[place] OFFICE PROGRESS NOTE   Diagnosis: Pancreas cancer  INTERVAL HISTORY:   Mr. Patchell returns as scheduled.  He underwent an ERCP with placement of a metal biliary stent in the common bile duct on 08/16/2017 by Dr. Francella Solian at [facility].  In the afternoons he notes some bloating and abdominal discomfort.  He has tried tramadol and also notes that laying down helps.  His wife thinks that he is less jaundiced.  Overall good appetite.  He walked 2-1/2 miles earlier today.  Objective:  Vital signs in last 24 hours:  Blood pressure (!) 145/76, pulse 64, temperature 98.1 F (36.7 C), temperature source Oral, resp. rate 18, height 6' 4.5" (1.943 m), weight 231 lb 12.8 oz (105.1 kg), SpO2 100 %.    HEENT: No thrush or ulcers.  No scleral icterus. Resp: Lungs clear bilaterally. Cardio: Regular rate and rhythm. GI: Abdomen soft and nontender.  No hepatomegaly.  No mass.  No apparent ascites. Vascular: No leg edema. Neuro: Vibratory sense intact over the fingertips for tuning fork exam.    Lab Results:  Lab Results  Component Value Date   WBC 5.1 08/04/2017   HGB 13.1 08/04/2017   HCT 38.6 (L) 08/04/2017   MCV 86.0 08/04/2017   PLT 102 (L) 08/04/2017   NEUTROABS 4,130 10/27/2015    Imaging:  No results found.  Medications: I have reviewed the patient's current medications.  Assessment/Plan: 1. Pancreas head mass  CT abdomen/pelvis 08/03/2017-fullness in the head of the pancreas with stranding in the peripancreatic fat and pancreatic ductal dilatation.    MRI abdomen 08/04/2017-findings favored to represent acute on chronic pancreatitis; equivocal soft tissue fullness within the head and uncinate process of the pancreas; indeterminate too small to characterize right hepatic lobe lesion; small volume abdominal ascites; splenomegaly.    CA-19-9 elevated at 977 on 08/04/2017.  Status post upper EUS 08/10/2017-findings of an irregular masslike region in the pancreatic  head measuring approximately 2.7 cm.  This directly abutted the main portal vein but no other major vascular structures.  Biopsies obtained with preliminary cytology positive for malignancy, likely adenocarcinoma. The final report is pending.  The common bile duct and main pancreatic duct were both dilated.  ERCP was then proceeded with for stent placement.  Multiple attempts were made to cannulate the bile duct without success.  The procedure was aborted.    08/16/2017 ERCP with placement of a metal biliary stent in the common bile duct by Dr. Francella Solian at [facility]. 2. Hypertension   Disposition: Mr. Zelman appears stable.  He underwent stent placement last week.  The bilirubin is better, now 1.7.  We discussed beginning chemotherapy with FOLFIRINOX.  We reviewed potential toxicities associated with chemotherapy including bone marrow toxicity, nausea, hair loss, allergic reaction.  We reviewed potential toxicities associated with 5-fluorouracil including mouth sores, diarrhea, skin rash, increased sensitivity to sun, hand-foot syndrome, skin hyperpigmentation.  We discussed the diarrhea, possibly severe, associated with Irinotecan.  We discussed the various forms of neuropathy associated with Oxaliplatin including cold sensitivity, peripheral neuropathy, acute laryngo-pharyngeal dysesthesia and more rare occurrences such as diplopia, ataxia, incontinence.  We discussed that he will receive Neulasta on the day of pump discontinuation.  Potential toxicities associated with Neulasta were reviewed including bone pain, rash, splenic rupture.  He agrees to proceed.  He will attend a chemotherapy education class.  We are referring him to interventional radiology for placement of a Port-A-Cath.  We are also referring him for a noncontrast CT scan  of the chest to complete the staging evaluation.  Prescriptions were sent to his pharmacy for Compazine and EMLA cream.  He was also provided with a new prescription for  tramadol.  He will return for repeat labs, follow-up visit and cycle 1 FOLFIRINOX 08/28/2017.  He will contact the office in the interim with any problems.  Patient seen with Dr. Benay Spice.  40 minutes were spent face-to-face at today's visit with the majority of that time involved in counseling/coordination of care.    Ned Card ANP/GNP-BC   08/21/2017  4:02 PM This was a shared visit with Ned Card. Mr. Niemczyk appears stable.  He underwent placement of a bile duct stent at [facility] on 08/16/2017.  The bilirubin is lower today.  Mr. Ose has been diagnosed with borderline resectable pancreas cancer.  I recommend neoadjuvant FOLFIRINOX chemotherapy.  He is scheduled for a second opinion appointment at [facility] later this week.  The plan is to proceed with FOLFIRINOX during the week of 08/28/2017 if the oncology team at [facility] is in agreement.  We reviewed the potential toxicities associated with the FOLFIRINOX regimen including the chance of an allergic reaction, diarrhea, neuropathy, and hematologic toxicity.  He agrees to proceed.  He will received 4-5 cycles of FOLFIRINOX prior to a restaging evaluation.  Julieanne Manson, MD

## 2017-08-22 ENCOUNTER — Inpatient Hospital Stay: Payer: BLUE CROSS/BLUE SHIELD

## 2017-08-22 ENCOUNTER — Encounter: Payer: Self-pay | Admitting: *Deleted

## 2017-08-23 ENCOUNTER — Telehealth: Payer: Self-pay

## 2017-08-23 DIAGNOSIS — K831 Obstruction of bile duct: Secondary | ICD-10-CM | POA: Diagnosis not present

## 2017-08-23 DIAGNOSIS — C250 Malignant neoplasm of head of pancreas: Secondary | ICD-10-CM | POA: Diagnosis not present

## 2017-08-23 DIAGNOSIS — R911 Solitary pulmonary nodule: Secondary | ICD-10-CM | POA: Diagnosis not present

## 2017-08-23 NOTE — Telephone Encounter (Signed)
Called and spoke with patient concerning upcoming appointment. Per 2/20 schedule updated.

## 2017-08-24 ENCOUNTER — Encounter: Payer: Self-pay | Admitting: Oncology

## 2017-08-24 ENCOUNTER — Encounter (HOSPITAL_COMMUNITY): Payer: Self-pay

## 2017-08-24 ENCOUNTER — Ambulatory Visit (HOSPITAL_COMMUNITY): Admission: RE | Admit: 2017-08-24 | Payer: BLUE CROSS/BLUE SHIELD | Source: Ambulatory Visit

## 2017-08-24 NOTE — Progress Notes (Signed)
Attempted to call pt to introduce myself as his Arboriculturist and to discuss copay assistance but both numbers on file did not work so I will plan to meet him on 08/31/17.

## 2017-08-25 ENCOUNTER — Encounter: Payer: Self-pay | Admitting: Pharmacist

## 2017-08-27 ENCOUNTER — Other Ambulatory Visit: Payer: Self-pay | Admitting: Oncology

## 2017-08-29 ENCOUNTER — Other Ambulatory Visit: Payer: Self-pay | Admitting: Radiology

## 2017-08-29 DIAGNOSIS — I81 Portal vein thrombosis: Secondary | ICD-10-CM | POA: Diagnosis not present

## 2017-08-29 DIAGNOSIS — C250 Malignant neoplasm of head of pancreas: Secondary | ICD-10-CM | POA: Diagnosis not present

## 2017-08-29 DIAGNOSIS — K869 Disease of pancreas, unspecified: Secondary | ICD-10-CM | POA: Diagnosis not present

## 2017-08-29 NOTE — Progress Notes (Signed)
  Oncology Nurse Navigator Documentation  Navigator Location: [facility] (08/29/17 1047)   )Navigator Encounter Type: Telephone (08/29/17 1047) Telephone: Incoming Call;Outgoing Call;Appt Confirmation/Clarification (08/29/17 1047)  Patient called to clarify appointment with IR on 08/30/17. I called patient's wife back and let her know to report to Radiology and for pt to be NPO.                                      Acuity: Level 2 (08/29/17 1047)   Acuity Level 2: Ongoing guidance and education throughout treatment as needed (08/29/17 1047)     Time Spent with Patient: 15 (08/29/17 1047)

## 2017-08-30 ENCOUNTER — Encounter (HOSPITAL_COMMUNITY): Payer: Self-pay

## 2017-08-30 ENCOUNTER — Other Ambulatory Visit: Payer: Self-pay | Admitting: Nurse Practitioner

## 2017-08-30 ENCOUNTER — Ambulatory Visit (HOSPITAL_COMMUNITY)
Admission: RE | Admit: 2017-08-30 | Discharge: 2017-08-30 | Disposition: A | Discharge status: Home / Self Care | Payer: BLUE CROSS/BLUE SHIELD | Source: Ambulatory Visit | Attending: Oncology | Admitting: Oncology

## 2017-08-30 ENCOUNTER — Ambulatory Visit (HOSPITAL_COMMUNITY)
Admission: RE | Admit: 2017-08-30 | Discharge: 2017-08-30 | Disposition: A | Discharge status: Home / Self Care | Payer: BLUE CROSS/BLUE SHIELD | Source: Ambulatory Visit | Attending: Nurse Practitioner | Admitting: Nurse Practitioner

## 2017-08-30 DIAGNOSIS — C250 Malignant neoplasm of head of pancreas: Secondary | ICD-10-CM

## 2017-08-30 DIAGNOSIS — C259 Malignant neoplasm of pancreas, unspecified: Secondary | ICD-10-CM | POA: Insufficient documentation

## 2017-08-30 DIAGNOSIS — I10 Essential (primary) hypertension: Secondary | ICD-10-CM | POA: Diagnosis not present

## 2017-08-30 DIAGNOSIS — Z5111 Encounter for antineoplastic chemotherapy: Secondary | ICD-10-CM | POA: Diagnosis not present

## 2017-08-30 HISTORY — PX: IR US GUIDE VASC ACCESS RIGHT: IMG2390

## 2017-08-30 HISTORY — PX: IR FLUORO GUIDE PORT INSERTION RIGHT: IMG5741

## 2017-08-30 LAB — PROTIME-INR
INR: 1.11
PROTHROMBIN TIME: 14.2 s (ref 11.4–15.2)

## 2017-08-30 LAB — CBC WITH DIFFERENTIAL/PLATELET
BASOS PCT: 1 %
Basophils Absolute: 0 10*3/uL (ref 0.0–0.1)
EOS ABS: 0.1 10*3/uL (ref 0.0–0.7)
Eosinophils Relative: 3 %
HCT: 36.9 % — ABNORMAL LOW (ref 39.0–52.0)
HEMOGLOBIN: 13 g/dL (ref 13.0–17.0)
Lymphocytes Relative: 14 %
Lymphs Abs: 0.6 10*3/uL — ABNORMAL LOW (ref 0.7–4.0)
MCH: 29.1 pg (ref 26.0–34.0)
MCHC: 35.2 g/dL (ref 30.0–36.0)
MCV: 82.7 fL (ref 78.0–100.0)
MONOS PCT: 12 %
Monocytes Absolute: 0.5 10*3/uL (ref 0.1–1.0)
NEUTROS PCT: 70 %
Neutro Abs: 2.8 10*3/uL (ref 1.7–7.7)
Platelets: 108 10*3/uL — ABNORMAL LOW (ref 150–400)
RBC: 4.46 MIL/uL (ref 4.22–5.81)
RDW: 12.6 % (ref 11.5–15.5)
WBC: 4 10*3/uL (ref 4.0–10.5)

## 2017-08-30 MED ORDER — CEFAZOLIN SODIUM-DEXTROSE 2-4 GM/100ML-% IV SOLN
INTRAVENOUS | Status: AC
  Administered 2017-08-30: 2 g via INTRAVENOUS
  Filled 2017-08-30: qty 100

## 2017-08-30 MED ORDER — FENTANYL CITRATE (PF) 100 MCG/2ML IJ SOLN
INTRAMUSCULAR | Status: AC
  Filled 2017-08-30: qty 2

## 2017-08-30 MED ORDER — LIDOCAINE-EPINEPHRINE (PF) 1 %-1:200000 IJ SOLN
INTRAMUSCULAR | Status: AC | PRN
  Administered 2017-08-30: 10 mL

## 2017-08-30 MED ORDER — LIDOCAINE-EPINEPHRINE (PF) 1 %-1:200000 IJ SOLN
INTRAMUSCULAR | Status: AC | PRN
  Administered 2017-08-30: 20 mL

## 2017-08-30 MED ORDER — CEFAZOLIN SODIUM-DEXTROSE 2-4 GM/100ML-% IV SOLN
2.0000 g | INTRAVENOUS | Status: AC
  Administered 2017-08-30: 2 g via INTRAVENOUS

## 2017-08-30 MED ORDER — FENTANYL CITRATE (PF) 100 MCG/2ML IJ SOLN
INTRAMUSCULAR | Status: AC | PRN
  Administered 2017-08-30 (×2): 50 ug via INTRAVENOUS

## 2017-08-30 MED ORDER — MIDAZOLAM HCL 2 MG/2ML IJ SOLN
INTRAMUSCULAR | Status: AC | PRN
  Administered 2017-08-30 (×4): 1 mg via INTRAVENOUS

## 2017-08-30 MED ORDER — MIDAZOLAM HCL 2 MG/2ML IJ SOLN
INTRAMUSCULAR | Status: AC
  Filled 2017-08-30: qty 4

## 2017-08-30 MED ORDER — SODIUM CHLORIDE 0.9 % IV SOLN
INTRAVENOUS | Status: DC
  Administered 2017-08-30: 08:00:00 via INTRAVENOUS

## 2017-08-30 NOTE — Procedures (Signed)
Pre Procedure Dx: Pancreatic Cancer Post Procedural Dx: Same  Successful placement of right IJ approach port-a-cath with tip at the superior caval atrial junction. The catheter is ready for immediate use.  Estimated Blood Loss: Minimal  Complications: None immediate.  Jay Terril Amaro, MD Pager #: [phone]   

## 2017-08-30 NOTE — Discharge Instructions (Signed)
You may remove dressing and bathe in 24 hours.   Do not use Emla cream until glue & steri strips have come off.   Moderate Conscious Sedation, Adult, Care After These instructions provide you with information about caring for yourself after your procedure. Your health care provider may also give you more specific instructions. Your treatment has been planned according to current medical practices, but problems sometimes occur. Call your health care provider if you have any problems or questions after your procedure. What can I expect after the procedure? After your procedure, it is common:  To feel sleepy for several hours.  To feel clumsy and have poor balance for several hours.  To have poor judgment for several hours.  To vomit if you eat too soon.  Follow these instructions at home: For at least 24 hours after the procedure:   Do not: ? Participate in activities where you could fall or become injured. ? Drive. ? Use heavy machinery. ? Drink alcohol. ? Take sleeping pills or medicines that cause drowsiness. ? Make important decisions or sign legal documents. ? Take care of children on your own.  Rest. Eating and drinking  Follow the diet recommended by your health care provider.  If you vomit: ? Drink water, juice, or soup when you can drink without vomiting. ? Make sure you have little or no nausea before eating solid foods. General instructions  Have a responsible adult stay with you until you are awake and alert.  Take over-the-counter and prescription medicines only as told by your health care provider.  If you smoke, do not smoke without supervision.  Keep all follow-up visits as told by your health care provider. This is important. Contact a health care provider if:  You keep feeling nauseous or you keep vomiting.  You feel light-headed.  You develop a rash.  You have a fever. Get help right away if:  You have trouble breathing. This information is  not intended to replace advice given to you by your health care provider. Make sure you discuss any questions you have with your health care provider. Document Released: 04/10/2013 Document Revised: 11/23/2015 Document Reviewed: 10/10/2015 Elsevier Interactive Patient Education  2018 [place] An implanted port is a type of central line that is placed under the skin. Central lines are used to provide IV access when treatment or nutrition needs to be given through a persons veins. Implanted ports are used for long-term IV access. An implanted port may be placed because:  You need IV medicine that would be irritating to the small veins in your hands or arms.  You need long-term IV medicines, such as antibiotics.  You need IV nutrition for a long period.  You need frequent blood draws for lab tests.  You need dialysis.  Implanted ports are usually placed in the chest area, but they can also be placed in the upper arm, the abdomen, or the leg. An implanted port has two main parts:  Reservoir. The reservoir is round and will appear as a small, raised area under your skin. The reservoir is the part where a needle is inserted to give medicines or draw blood.  Catheter. The catheter is a thin, flexible tube that extends from the reservoir. The catheter is placed into a large vein. Medicine that is inserted into the reservoir goes into the catheter and then into the vein.  How will I care for my incision site? Do not get the incision site  wet. Bathe or shower as directed by your health care provider. How is my port accessed? Special steps must be taken to access the port:  Before the port is accessed, a numbing cream can be placed on the skin. This helps numb the skin over the port site.  Your health care provider uses a sterile technique to access the port. ? Your health care provider must put on a mask and sterile gloves. ? The skin over your port is cleaned  carefully with an antiseptic and allowed to dry. ? The port is gently pinched between sterile gloves, and a needle is inserted into the port.  Only "non-coring" port needles should be used to access the port. Once the port is accessed, a blood return should be checked. This helps ensure that the port is in the vein and is not clogged.  If your port needs to remain accessed for a constant infusion, a clear (transparent) bandage will be placed over the needle site. The bandage and needle will need to be changed every week, or as directed by your health care provider.  Keep the bandage covering the needle clean and dry. Do not get it wet. Follow your health care providers instructions on how to take a shower or bath while the port is accessed.  If your port does not need to stay accessed, no bandage is needed over the port.  What is flushing? Flushing helps keep the port from getting clogged. Follow your health care providers instructions on how and when to flush the port. Ports are usually flushed with saline solution or a medicine called heparin. The need for flushing will depend on how the port is used.  If the port is used for intermittent medicines or blood draws, the port will need to be flushed: ? After medicines have been given. ? After blood has been drawn. ? As part of routine maintenance.  If a constant infusion is running, the port may not need to be flushed.  How long will my port stay implanted? The port can stay in for as long as your health care provider thinks it is needed. When it is time for the port to come out, surgery will be done to remove it. The procedure is similar to the one performed when the port was put in. When should I seek immediate medical care? When you have an implanted port, you should seek immediate medical care if:  You notice a bad smell coming from the incision site.  You have swelling, redness, or drainage at the incision site.  You have more  swelling or pain at the port site or the surrounding area.  You have a fever that is not controlled with medicine.  This information is not intended to replace advice given to you by your health care provider. Make sure you discuss any questions you have with your health care provider. Document Released: 06/20/2005 Document Revised: 11/26/2015 Document Reviewed: 02/25/2013 Elsevier Interactive Patient Education  2017 [place] Insertion, Care After This sheet gives you information about how to care for yourself after your procedure. Your health care provider may also give you more specific instructions. If you have problems or questions, contact your health care provider. What can I expect after the procedure? After your procedure, it is common to have:  Discomfort at the port insertion site.  Bruising on the skin over the port. This should improve over 3-4 days.  Follow these instructions at home: [facility]  After your port is placed, you will get a manufacturer's information card. The card has information about your port. Keep this card with you at all times.  Take care of the port as told by your health care provider. Ask your health care provider if you or a family member can get training for taking care of the port at home. A home health care nurse may also take care of the port.  Make sure to remember what type of port you have. Incision care  Follow instructions from your health care provider about how to take care of your port insertion site. Make sure you: ? Wash your hands with soap and water before you change your bandage (dressing). If soap and water are not available, use hand sanitizer. ? Change your dressing as told by your health care provider. ? Leave stitches (sutures), skin glue, or adhesive strips in place. These skin closures may need to stay in place for 2 weeks or longer. If adhesive strip edges start to loosen and curl up, you may trim the  loose edges. Do not remove adhesive strips completely unless your health care provider tells you to do that.  Check your port insertion site every day for signs of infection. Check for: ? More redness, swelling, or pain. ? More fluid or blood. ? Warmth. ? Pus or a bad smell. General instructions  Do not take baths, swim, or use a hot tub until your health care provider approves.  Do not lift anything that is heavier than 10 lb (4.5 kg) for a week, or as told by your health care provider.  Ask your health care provider when it is okay to: ? Return to work or school. ? Resume usual physical activities or sports.  Do not drive for 24 hours if you were given a medicine to help you relax (sedative).  Take over-the-counter and prescription medicines only as told by your health care provider.  Wear a medical alert bracelet in case of an emergency. This will tell any health care providers that you have a port.  Keep all follow-up visits as told by your health care provider. This is important. Contact a health care provider if:  You cannot flush your port with saline as directed, or you cannot draw blood from the port.  You have a fever or chills.  You have more redness, swelling, or pain around your port insertion site.  You have more fluid or blood coming from your port insertion site.  Your port insertion site feels warm to the touch.  You have pus or a bad smell coming from the port insertion site. Get help right away if:  You have chest pain or shortness of breath.  You have bleeding from your port that you cannot control. Summary  Take care of the port as told by your health care provider.  Change your dressing as told by your health care provider.  Keep all follow-up visits as told by your health care provider. This information is not intended to replace advice given to you by your health care provider. Make sure you discuss any questions you have with your health care  provider. Document Released: 04/10/2013 Document Revised: 05/11/2016 Document Reviewed: 05/11/2016 Elsevier Interactive Patient Education  2017 [employer].

## 2017-08-30 NOTE — Consult Note (Signed)
Chief Complaint: Patient was seen in consultation today for Port-A-Cath placement  Referring Physician(s): Sherrill,B  Supervising Physician: Sandi Mariscal  Patient Status: [facility] - Out-pt  History of Present Illness: Cory Kelly is a 62 y.o. male with history of recently diagnosed pancreatic cancer and biliary stenting at [facility] who presents today for Port-A-Cath placement for planned chemotherapy.  Past Medical History:  Diagnosis Date  . Abdominal pain    due to bloating  . Bloating   . Cancer ([place])    skin cancer  . Essential hypertension   . Fatigue   . History of weight loss   . HOH (hard of hearing)    no hearing aids  . Stress    loss of father in Dec 2018.    Past Surgical History:  Procedure Laterality Date  . APPENDECTOMY    . ENDOSCOPIC RETROGRADE CHOLANGIOPANCREATOGRAPHY (ERCP) WITH PROPOFOL N/A 08/10/2017   Procedure: ENDOSCOPIC RETROGRADE CHOLANGIOPANCREATOGRAPHY (ERCP) WITH PROPOFOL;  Surgeon: Milus Banister, MD;  Location: [facility] ENDOSCOPY;  Service: Endoscopy;  Laterality: N/A;  . EUS N/A 08/10/2017   Procedure: UPPER ENDOSCOPIC ULTRASOUND (EUS) RADIAL;  Surgeon: Milus Banister, MD;  Location: [facility] ENDOSCOPY;  Service: Endoscopy;  Laterality: N/A;  . EYE SURGERY     lasik/left eye  . SKIN CANCER EXCISION      Allergies: Patient has no known allergies.  Medications: Prior to Admission medications   Medication Sig Start Date End Date Taking? Authorizing Provider  losartan (COZAAR) 50 MG tablet Take 1 tablet (50 mg total) by mouth daily. 08/05/17  Yes Rai, Ripudeep K, MD  traMADol (ULTRAM) 50 MG tablet Take 1 tablet (50 mg total) by mouth every 8 (eight) hours as needed. 08/21/17  Yes Owens Shark, NP  lidocaine-prilocaine (EMLA) cream Apply 1 application topically as needed. Apply to portacath site 1-2 hours prior to use 08/21/17   Owens Shark, NP  prochlorperazine (COMPAZINE) 10 MG tablet Take 1 tablet (10 mg total) by mouth every 6 (six) hours as  needed for nausea or vomiting. 08/21/17   [employer] Shark, NP     Family History  Problem Relation Age of Onset  . Hyperlipidemia Father   . Heart disease Father   . Dementia Father   . Memory loss Mother     Social History   Socioeconomic History  . Marital status: Married    Spouse name: None  . Number of children: None  . Years of education: None  . Highest education level: None  Social Needs  . Financial resource strain: None  . Food insecurity - worry: None  . Food insecurity - inability: None  . Transportation needs - medical: None  . Transportation needs - non-medical: None  Occupational History    Employer: [employer]  Tobacco Use  . Smoking status: Never Smoker  . Smokeless tobacco: Never Used  Substance and Sexual Activity  . Alcohol use: No  . Drug use: No  . Sexual activity: Yes    Birth control/protection: None  Other Topics Concern  . None  Social History Narrative   Married   Sports administrator womens basketball at [employer] currently denies fever, headache, chest pain, dyspnea, cough, nausea, vomiting or bleeding.  He does have history of fatigue, weight loss, some intermittent epigastric and back discomfort.  Vital Signs: BP (!) 153/94   Pulse 63   Temp 98 F (36.7 C) (Oral)   Resp 16   Ht  6' 4.5" (1.943 m)   Wt 230 lb (104.3 kg)   SpO2 98%   BMI 27.63 kg/m   Physical Exam awake, alert.  Mild scleral icterus.  Chest with  few fine bibasilar crackles.  Heart with regular rate and rhythm.  Abdomen soft, positive bowel sounds, currently nontender.  No lower extremity edema  Imaging: Dg Chest 2 View  Result Date: 08/03/2017 CLINICAL DATA:  Central chest pain for 3 weeks. EXAM: CHEST  2 VIEW COMPARISON:  February 21, 2007 FINDINGS: The heart size and mediastinal contours are within normal limits. Both lungs are clear. The visualized skeletal structures are unremarkable. IMPRESSION: No active cardiopulmonary disease.  Electronically Signed   By: Dorise Bullion III M.D   On: 08/03/2017 19:30   US Abdomen Complete  Result Date: 08/04/2017 CLINICAL DATA:  Abdominal pain EXAM: COMPLETE ABDOMINAL ULTRASOUND COMPARISON:  MR from the same day, CT from previous day FINDINGS: Gallbladder: Gallbladder is distended containing sludge. No wall thickening or pericholecystic fluid. Common bile duct:  Mildly dilated, 8.2 mm diameter. Liver: Homogeneous in echotexture without focal lesion or intrahepatic bile duct dilatation. Antegrade color Doppler signal in the portal vein. IVC:  Negative Pancreas: Visualized segments unremarkable, portions obscured by overlying bowel gas. Spleen: No focal lesion, 16.7 x 17.8 x 7.8 cm (volume = 1200 cm^3). Right Kidney: No hydronephrosis. 3 cm lower pole simple appearing cyst. 0.9 cm shadowing calculus in the upper pole collecting system. 14.5 cm in length. Left Kidney: 15.1 cm in length. No mass or hydronephrosis. 0.6 cm shadowing focus in the lower pole collecting system. Abdominal aorta: Fusiform dilatation up to 2.7 cm diameter in its mid segment. IMPRESSION: 1. Sludge filled gallbladder without other ultrasound evidence of cholecystitis . 2. Splenomegaly 3. Ectatic abdominal aorta at risk for aneurysm development. Recommend followup by ultrasound in 5 years. This recommendation follows ACR consensus guidelines: White Paper of the ACR Incidental Findings Committee II on Vascular Findings. J Am Coll Radiol 2013; 10:789-794. 4. Bilateral nephrolithiasis without hydronephrosis. Electronically Signed   By: Lucrezia Europe M.D.   On: 08/04/2017 10:40   Ct Abdomen Pelvis W Contrast  Result Date: 08/03/2017 CLINICAL DATA:  Lower abdominal pain, back pain, and constipation for 1 month. EXAM: CT ABDOMEN AND PELVIS WITH CONTRAST TECHNIQUE: Multidetector CT imaging of the abdomen and pelvis was performed using the standard protocol following bolus administration of intravenous contrast. CONTRAST:  100 mL Isovue-300  COMPARISON:  02/10/2007 FINDINGS: Lower chest: Emphysematous changes and atelectasis in the lung bases. Coronary artery calcifications. Hepatobiliary: No focal liver abnormality is seen. No gallstones, gallbladder wall thickening, or biliary dilatation. Pancreas: There is heterogeneous fullness in the head of the pancreas with stranding around the peripancreatic fat and mild pancreatic ductal dilatation. This could represent focal inflammation or underlying mass lesion. Spleen: Normal in size without focal abnormality. Adrenals/Urinary Tract: No adrenal gland nodules. 8 mm stone in the midportion right kidney. 5 mm stone in the lower pole left kidney. Additional tiny punctate stones in both kidneys. Benign-appearing cyst in the lower pole right kidney. No hydronephrosis or hydroureter. No ureteral stones identified. Bladder wall is not thickened. No bladder stones. Stomach/Bowel: Stomach, small bowel, and colon are not abnormally distended. No inflammatory changes or wall thickening identified. Appendix is not identified. Vascular/Lymphatic: Aortic atherosclerosis. No enlarged abdominal or pelvic lymph nodes. Reproductive: Prostate is unremarkable. Other: No abdominal wall hernia or abnormality. No abdominopelvic ascites. Musculoskeletal: Degenerative changes in the spine. Bridging anterior osteophytes in the lower thoracic and upper  lumbar region. No destructive bone lesions. IMPRESSION: 1. Fullness in the head of the pancreas with stranding in the peripancreatic fat and pancreatic ductal dilatation. Consider focal acute pancreatitis versus underlying mass lesion. Laboratory correlation is recommended. Consider follow-up with elective MRI/MRCP to exclude a pancreatic head mass. 2. Multiple bilateral nonobstructing intrarenal stones. No ureteral stone or obstruction. 3. Emphysematous changes and atelectasis in the lung bases. 4. Aortic atherosclerosis. Electronically Signed   By: Lucienne Capers M.D.   On:  08/03/2017 23:58   Dg C-arm 1-60 Min-no Report  Result Date: 08/10/2017 Fluoroscopy was utilized by the requesting physician.  No radiographic interpretation.   Mr Abdomen With Mrcp W Contrast  Result Date: 08/04/2017 CLINICAL DATA:  Bloatedness and constipation for the last 2 days. Lower abdominal pain. Abnormal CT demonstrating probable pancreatitis. EXAM: MRI ABDOMEN WITHOUT CONTRAST  (INCLUDING MRCP) TECHNIQUE: Multiplanar multisequence MR imaging of the abdomen was performed. Heavily T2-weighted images of the biliary and pancreatic ducts were obtained, and three-dimensional MRCP images were rendered by post processing. COMPARISON:  CT of 1 day prior and 02/10/2007. FINDINGS: Portions of exam, especially the MRCP dedicated series, are motion degraded. Lower chest: Mild cardiomegaly, without pericardial or pleural effusion. Hepatobiliary: A 6 mm right liver lobe too small to characterize lesion image 56/series 1412. Intrahepatic ducts are upper normal. Normal gallbladder. The common duct measures 8 mm on image 23/series 5, upper normal for patient age. No choledocholithiasis identified. The common duct is followed to the level of the pancreatic head, where it undergoes a transition to decompressed, felt to be just above the ampulla. Example images 71 through 76/series 1401. Pancreas: Mild pancreatic duct dilatation throughout the body and tail with duct irregularity. Example image 28/series 6. This duct dilatation is followed to the level of an accessory entrance into the duodenum, including image 31/series 6. Within the pancreatic head and uncinate process is an equivocal area of soft tissue fullness, including at on the order of 2.4 cm on image 80/series 1403. This area does demonstrate restricted diffusion on image 53/series 8, but maintains normal precontrast T1 hyperintensity, including on image 86/series 1400. Moderate peripancreatic edema surrounds the head and uncinate process. Example image  32/series 7. No peripancreatic fluid collection. Spleen:  Splenomegaly, 17.8 cm craniocaudal. Adrenals/Urinary Tract: Normal adrenal glands. Normal left kidney. Lower pole right renal 2.8 cm cyst. No hydronephrosis. Stomach/Bowel: Normal stomach and abdominal bowel loops. Vascular/Lymphatic: Normal caliber of the aorta and branch vessels. Patent splenic and portal veins. No retroperitoneal or retrocrural adenopathy. Other:  Small volume perihepatic ascites. Musculoskeletal: No acute osseous abnormality. IMPRESSION: 1. Mildly motion degraded exam. 2. Findings which are favored to represent acute on chronic pancreatitis. Peripancreatic edema surrounding the head and uncinate process, superimposed upon mild diffuse pancreatic duct dilatation. Pancreas divisum. 3. Equivocal soft tissue fullness within the head and uncinate process of the pancreas. Suggestion of common duct narrowing in this region. Therefore, concurrent adenocarcinoma canal be excluded. Potential clinical strategies include medical therapy with repeat pre and post contrast abdominal MRI/MRCP at 3-4 weeks versus further characterization with endoscopic ultrasound with special attention to the head and uncinate process. 4. Indeterminate too small to characterize right hepatic lobe lesion. Recommend attention on follow-up. 5. Small volume abdominal ascites. 6. Splenomegaly. Electronically Signed   By: Abigail Miyamoto M.D.   On: 08/04/2017 07:27    Labs:  CBC: Recent Labs    08/03/17 1910 08/04/17 0256 08/30/17 0750  WBC 6.0 5.1 4.0  HGB 14.7 13.1 13.0  HCT 42.0  38.6* 36.9*  PLT 120* 102* 108*    COAGS: Recent Labs    08/30/17 0750  INR 1.11    BMP: Recent Labs    08/03/17 1910 08/04/17 0256 08/05/17 0559 08/21/17 1411  NA 138 138 138 140  K 3.6 3.8 3.6 3.6  CL 104 106 104 101  CO2 23 23 25 29   GLUCOSE 109* 107* 114* 157*  BUN 8 8 9 12   CALCIUM 8.9 8.7* 8.7* 9.1  CREATININE 0.96 0.89 0.89 1.05  GFRNONAA >60 >60 >60 >60    GFRAA >60 >60 >60 >60    LIVER FUNCTION TESTS: Recent Labs    08/04/17 0256 08/05/17 0559 08/10/17 0740 08/21/17 1411  BILITOT 2.9* 3.3* 3.3* 1.7*  AST 81* 74* 426* 68*  ALT 374* 273* 741* 447*  ALKPHOS 216* 219* 493* 553*  PROT 5.7* 5.7* 6.8 6.1*  ALBUMIN 3.5 3.2* 4.1 3.3*    TUMOR MARKERS: No results for input(s): AFPTM, CEA, CA199, CHROMGRNA in the last 8760 hours.  Assessment and Plan: 62 y.o. male with history of recently diagnosed pancreatic cancer and biliary stenting at [facility] who presents today for Port-A-Cath placement for planned chemotherapy.Risks and benefits discussed with the patient/spouse including, but not limited to bleeding, infection, pneumothorax, or fibrin sheath development and need for additional procedures.  All of the patient's questions were answered, patient is agreeable to proceed. Consent signed and in chart.     Thank you for this interesting consult.  I greatly enjoyed meeting Cory Kelly and look forward to participating in their care.  A copy of this report was sent to the requesting provider on this date.  Electronically Signed: D. Rowe Robert, PA-C 08/30/2017, 9:04 AM   I spent a total of  25 minutes   in face to face in clinical consultation, greater than 50% of which was counseling/coordinating care for Port-A-Cath placement

## 2017-08-31 ENCOUNTER — Encounter: Payer: Self-pay | Admitting: Nurse Practitioner

## 2017-08-31 ENCOUNTER — Telehealth: Payer: Self-pay | Admitting: Nurse Practitioner

## 2017-08-31 ENCOUNTER — Inpatient Hospital Stay: Payer: BLUE CROSS/BLUE SHIELD

## 2017-08-31 ENCOUNTER — Encounter: Payer: Self-pay | Admitting: Oncology

## 2017-08-31 ENCOUNTER — Inpatient Hospital Stay (HOSPITAL_BASED_OUTPATIENT_CLINIC_OR_DEPARTMENT_OTHER): Payer: BLUE CROSS/BLUE SHIELD | Admitting: Nurse Practitioner

## 2017-08-31 VITALS — BP 146/81 | HR 51 | Temp 97.8°F | Resp 18 | Ht 76.5 in | Wt 229.2 lb

## 2017-08-31 DIAGNOSIS — I10 Essential (primary) hypertension: Secondary | ICD-10-CM

## 2017-08-31 DIAGNOSIS — R17 Unspecified jaundice: Secondary | ICD-10-CM

## 2017-08-31 DIAGNOSIS — Z95828 Presence of other vascular implants and grafts: Secondary | ICD-10-CM | POA: Insufficient documentation

## 2017-08-31 DIAGNOSIS — D72829 Elevated white blood cell count, unspecified: Secondary | ICD-10-CM | POA: Diagnosis not present

## 2017-08-31 DIAGNOSIS — C250 Malignant neoplasm of head of pancreas: Secondary | ICD-10-CM

## 2017-08-31 DIAGNOSIS — Z5111 Encounter for antineoplastic chemotherapy: Secondary | ICD-10-CM | POA: Diagnosis not present

## 2017-08-31 DIAGNOSIS — D696 Thrombocytopenia, unspecified: Secondary | ICD-10-CM | POA: Diagnosis not present

## 2017-08-31 LAB — CMP (CANCER CENTER ONLY)
ALBUMIN: 3.4 g/dL — AB (ref 3.5–5.0)
ALT: 153 U/L — AB (ref 0–55)
AST: 61 U/L — AB (ref 5–34)
Alkaline Phosphatase: 385 U/L — ABNORMAL HIGH (ref 40–150)
Anion gap: 9 (ref 3–11)
BUN: 11 mg/dL (ref 7–26)
CHLORIDE: 104 mmol/L (ref 98–109)
CO2: 27 mmol/L (ref 22–29)
CREATININE: 0.94 mg/dL (ref 0.70–1.30)
Calcium: 9.1 mg/dL (ref 8.4–10.4)
GFR, Est AFR Am: >60 mL/min (ref >60)
GFR, Estimated: >60 mL/min (ref >60)
Glucose, Bld: 160 mg/dL — ABNORMAL HIGH (ref 70–140)
POTASSIUM: 3.8 mmol/L (ref 3.5–5.1)
SODIUM: 140 mmol/L (ref 136–145)
Total Bilirubin: 2 mg/dL — ABNORMAL HIGH (ref 0.2–1.2)
Total Protein: 6.2 g/dL — ABNORMAL LOW (ref 6.4–8.3)

## 2017-08-31 LAB — CBC WITH DIFFERENTIAL (CANCER CENTER ONLY)
Basophils Absolute: 0 10*3/uL (ref 0.0–0.1)
Basophils Relative: 1 %
EOS ABS: 0.1 10*3/uL (ref 0.0–0.5)
EOS PCT: 3 %
HCT: 37.4 % — ABNORMAL LOW (ref 38.4–49.9)
HEMOGLOBIN: 12.7 g/dL — AB (ref 13.0–17.1)
LYMPHS ABS: 0.6 10*3/uL — AB (ref 0.9–3.3)
LYMPHS PCT: 17 %
MCH: 28.9 pg (ref 27.2–33.4)
MCHC: 34.1 g/dL (ref 32.0–36.0)
MCV: 84.7 fL (ref 79.3–98.0)
MONOS PCT: 9 %
Monocytes Absolute: 0.3 10*3/uL (ref 0.1–0.9)
Neutro Abs: 2.3 10*3/uL (ref 1.5–6.5)
Neutrophils Relative %: 70 %
PLATELETS: 112 10*3/uL — AB (ref 140–400)
RBC: 4.41 MIL/uL (ref 4.20–5.82)
RDW: 13.6 % (ref 11.0–14.6)
WBC: 3.3 10*3/uL — AB (ref 4.0–10.3)

## 2017-08-31 MED ORDER — SODIUM CHLORIDE 0.9 % IV SOLN
400.0000 mg/m2 | Freq: Once | INTRAVENOUS | Status: AC
  Administered 2017-08-31: 952 mg via INTRAVENOUS
  Filled 2017-08-31: qty 47.6

## 2017-08-31 MED ORDER — PALONOSETRON HCL INJECTION 0.25 MG/5ML
0.2500 mg | Freq: Once | INTRAVENOUS | Status: AC
  Administered 2017-08-31: 0.25 mg via INTRAVENOUS

## 2017-08-31 MED ORDER — PALONOSETRON HCL INJECTION 0.25 MG/5ML
INTRAVENOUS | Status: AC
  Filled 2017-08-31: qty 5

## 2017-08-31 MED ORDER — SODIUM CHLORIDE 0.9 % IV SOLN
150.0000 mg/m2 | Freq: Once | INTRAVENOUS | Status: AC
  Administered 2017-08-31: 360 mg via INTRAVENOUS
  Filled 2017-08-31: qty 18

## 2017-08-31 MED ORDER — SODIUM CHLORIDE 0.9 % IV SOLN
Freq: Once | INTRAVENOUS | Status: AC
  Administered 2017-08-31: 12:00:00 via INTRAVENOUS
  Filled 2017-08-31: qty 5

## 2017-08-31 MED ORDER — SODIUM CHLORIDE 0.9% FLUSH
10.0000 mL | Freq: Once | INTRAVENOUS | Status: AC
  Administered 2017-08-31: 10 mL
  Filled 2017-08-31: qty 10

## 2017-08-31 MED ORDER — SODIUM CHLORIDE 0.9% FLUSH
10.0000 mL | INTRAVENOUS | Status: DC | PRN
  Filled 2017-08-31: qty 10

## 2017-08-31 MED ORDER — SODIUM CHLORIDE 0.9 % IV SOLN
2400.0000 mg/m2 | INTRAVENOUS | Status: DC
  Administered 2017-08-31: 5700 mg via INTRAVENOUS
  Filled 2017-08-31: qty 114

## 2017-08-31 MED ORDER — DEXTROSE 5 % IV SOLN
Freq: Once | INTRAVENOUS | Status: AC
  Administered 2017-08-31: 11:00:00 via INTRAVENOUS

## 2017-08-31 MED ORDER — ATROPINE SULFATE 1 MG/ML IJ SOLN
0.5000 mg | Freq: Once | INTRAMUSCULAR | Status: AC | PRN
  Administered 2017-08-31: 0.5 mg via INTRAVENOUS

## 2017-08-31 MED ORDER — OXALIPLATIN CHEMO INJECTION 100 MG/20ML
85.0000 mg/m2 | Freq: Once | INTRAVENOUS | Status: AC
  Administered 2017-08-31: 200 mg via INTRAVENOUS
  Filled 2017-08-31: qty 40

## 2017-08-31 MED ORDER — ATROPINE SULFATE 1 MG/ML IJ SOLN
INTRAMUSCULAR | Status: AC
  Filled 2017-08-31: qty 1

## 2017-08-31 MED ORDER — HEPARIN SOD (PORK) LOCK FLUSH 100 UNIT/ML IV SOLN
500.0000 [IU] | Freq: Once | INTRAVENOUS | Status: DC | PRN
  Filled 2017-08-31: qty 5

## 2017-08-31 NOTE — Progress Notes (Signed)
Met w/ pt to introduce myself as his Arboriculturist and to discuss copay assistance.  Pt gave me consent to apply in his behalf and he was approved w/ the [employer] for $4,500 from 08/31/17 to 08/31/18.  Pt is overqualified for the [employer].  He has my card for any questions or concerns he may have in the future.

## 2017-08-31 NOTE — Progress Notes (Signed)
Per Ned Card, ANP/Dr. Benay Spice it is ok to treat today with elevated bilirubin and elevated liver enzymes.

## 2017-08-31 NOTE — Progress Notes (Signed)
  Oncology Nurse Navigator Documentation  Navigator Location: [facility] (08/31/17 1012)   )Navigator Encounter Type: Lobby (08/31/17 1012)   Spoke to patient and wife at the cancer center today before his first chemo treatment.  I offered support and encouragement.                   Treatment Phase: First Chemo Tx (08/31/17 1012)     Interventions: Psycho-social support (08/31/17 1012)            Acuity: Level 2 (08/31/17 1012)         Time Spent with Patient: 15 (08/31/17 1012)

## 2017-08-31 NOTE — Progress Notes (Addendum)
Cory Kelly   Diagnosis: Pancreas cancer  INTERVAL HISTORY:   Cory Kelly returns as scheduled.  He has had periodic episodes of severe upper abdominal pain lasting about 1 minute.  This tends to occur once or twice a day.  No nausea or vomiting.  Bowels moving.  Appetite is stable.  His wife thinks he has lost a few more pounds.  No bleeding.  He notes easy bruising.  Objective:  Vital signs in last 24 hours:  Blood pressure (!) 146/81, pulse (!) 51, temperature 97.8 F (36.6 C), temperature source Oral, resp. rate 18, height 6' 4.5" (1.943 m), weight 229 lb 3.2 oz (104 kg), SpO2 100 %.    HEENT: No thrush or ulcers. Resp: Lungs clear bilaterally. Cardio: Regular rate and rhythm. GI: Abdomen soft and nontender.  No hepatomegaly.  No mass. Vascular: No leg edema. Neuro: Alert and oriented.  Port-A-Cath without erythema.  Lab Results:  Lab Results  Component Value Date   WBC 3.3 (L) 08/31/2017   HGB 13.0 08/30/2017   HCT 37.4 (L) 08/31/2017   MCV 84.7 08/31/2017   PLT 112 (L) 08/31/2017   NEUTROABS 2.3 08/31/2017    Imaging:  Ir US Guide Vasc Access Right  Result Date: 08/30/2017 INDICATION: History of pancreatic cancer. In need of durable intravenous access for chemotherapy administration. EXAM: IMPLANTED PORT A CATH PLACEMENT WITH ULTRASOUND AND FLUOROSCOPIC GUIDANCE COMPARISON:  None. MEDICATIONS: Ancef 2 gm IV; The antibiotic was administered within an appropriate time interval prior to skin puncture. ANESTHESIA/SEDATION: Moderate (conscious) sedation was employed during this procedure. A total of Versed 4 mg and Fentanyl 100 mcg was administered intravenously. Moderate Sedation Time: 26 minutes. The patient's level of consciousness and vital signs were monitored continuously by radiology nursing throughout the procedure under my direct supervision. CONTRAST:  None FLUOROSCOPY TIME:  24 seconds (8 mGy) COMPLICATIONS: None immediate.  PROCEDURE: The procedure, risks, benefits, and alternatives were explained to the patient. Questions regarding the procedure were encouraged and answered. The patient understands and consents to the procedure. The right neck and chest were prepped with chlorhexidine in a sterile fashion, and a sterile drape was applied covering the operative field. Maximum barrier sterile technique with sterile gowns and gloves were used for the procedure. A timeout was performed prior to the initiation of the procedure. Local anesthesia was provided with 1% lidocaine with epinephrine. After creating a small venotomy incision, a micropuncture kit was utilized to access the internal jugular vein. Real-time ultrasound guidance was utilized for vascular access including the acquisition of a permanent ultrasound image documenting patency of the accessed vessel. The microwire was utilized to measure appropriate catheter length. A subcutaneous port pocket was then created along the upper chest wall utilizing a combination of sharp and blunt dissection. The pocket was irrigated with sterile saline. A single lumen thin power injectable port was chosen for placement. The 8 Fr catheter was tunneled from the port pocket site to the venotomy incision. The port was placed in the pocket. The external catheter was trimmed to appropriate length. At the venotomy, an 8 Fr peel-away sheath was placed over a guidewire under fluoroscopic guidance. The catheter was then placed through the sheath and the sheath was removed. Final catheter positioning was confirmed and documented with a fluoroscopic spot radiograph. The port was accessed with a Huber needle, aspirated and flushed with heparinized saline. The venotomy site was closed with an interrupted 4-0 Vicryl suture. The port pocket incision was closed  with interrupted 2-0 Vicryl suture and the skin was opposed with a running subcuticular 4-0 Vicryl suture. Dermabond and Steri-strips were applied to  both incisions. Dressings were placed. The patient tolerated the procedure well without immediate post procedural complication. FINDINGS: After catheter placement, the tip lies within the superior cavoatrial junction. The catheter aspirates and flushes normally and is ready for immediate use. IMPRESSION: Successful placement of a right internal jugular approach power injectable Port-A-Cath. The catheter is ready for immediate use. Electronically Signed   By: Cory Kelly M.D.   On: 08/30/2017 11:04   Ir Fluoro Guide Port Insertion Right  Result Date: 08/30/2017 INDICATION: History of pancreatic cancer. In need of durable intravenous access for chemotherapy administration. EXAM: IMPLANTED PORT A CATH PLACEMENT WITH ULTRASOUND AND FLUOROSCOPIC GUIDANCE COMPARISON:  None. MEDICATIONS: Ancef 2 gm IV; The antibiotic was administered within an appropriate time interval prior to skin puncture. ANESTHESIA/SEDATION: Moderate (conscious) sedation was employed during this procedure. A total of Versed 4 mg and Fentanyl 100 mcg was administered intravenously. Moderate Sedation Time: 26 minutes. The patient's level of consciousness and vital signs were monitored continuously by radiology nursing throughout the procedure under my direct supervision. CONTRAST:  None FLUOROSCOPY TIME:  24 seconds (8 mGy) COMPLICATIONS: None immediate. PROCEDURE: The procedure, risks, benefits, and alternatives were explained to the patient. Questions regarding the procedure were encouraged and answered. The patient understands and consents to the procedure. The right neck and chest were prepped with chlorhexidine in a sterile fashion, and a sterile drape was applied covering the operative field. Maximum barrier sterile technique with sterile gowns and gloves were used for the procedure. A timeout was performed prior to the initiation of the procedure. Local anesthesia was provided with 1% lidocaine with epinephrine. After creating a small  venotomy incision, a micropuncture kit was utilized to access the internal jugular vein. Real-time ultrasound guidance was utilized for vascular access including the acquisition of a permanent ultrasound image documenting patency of the accessed vessel. The microwire was utilized to measure appropriate catheter length. A subcutaneous port pocket was then created along the upper chest wall utilizing a combination of sharp and blunt dissection. The pocket was irrigated with sterile saline. A single lumen thin power injectable port was chosen for placement. The 8 Fr catheter was tunneled from the port pocket site to the venotomy incision. The port was placed in the pocket. The external catheter was trimmed to appropriate length. At the venotomy, an 8 Fr peel-away sheath was placed over a guidewire under fluoroscopic guidance. The catheter was then placed through the sheath and the sheath was removed. Final catheter positioning was confirmed and documented with a fluoroscopic spot radiograph. The port was accessed with a Huber needle, aspirated and flushed with heparinized saline. The venotomy site was closed with an interrupted 4-0 Vicryl suture. The port pocket incision was closed with interrupted 2-0 Vicryl suture and the skin was opposed with a running subcuticular 4-0 Vicryl suture. Dermabond and Steri-strips were applied to both incisions. Dressings were placed. The patient tolerated the procedure well without immediate post procedural complication. FINDINGS: After catheter placement, the tip lies within the superior cavoatrial junction. The catheter aspirates and flushes normally and is ready for immediate use. IMPRESSION: Successful placement of a right internal jugular approach power injectable Port-A-Cath. The catheter is ready for immediate use. Electronically Signed   By: Cory Kelly M.D.   On: 08/30/2017 11:04    Medications: I have reviewed the patient's current  medications.  Assessment/Plan:  1. Pancreas head mass  CT abdomen/pelvis 08/03/2017-fullness in the head of the pancreas with stranding in the peripancreatic fat and pancreatic ductal dilatation.   MRI abdomen 08/04/2017-findings favored to represent acute on chronic pancreatitis; equivocal soft tissue fullness within the head and uncinate process of the pancreas; indeterminate too small to characterize right hepatic lobe lesion; small volume abdominal ascites; splenomegaly.   CA-19-9 elevated at 977on 08/04/2017.  Status postupper EUS 08/10/2017-findings of an irregular masslike region in the pancreatic head measuring approximately 2.7 cm. This directly abutted the main portal vein but no other major vascular structures. Biopsies obtained with preliminary cytology positive for malignancy, likely adenocarcinoma. The final report is pending. The common bile duct and main pancreatic duct were both dilated. ERCP was then proceeded with forstent placement. Multiple attempts were made tocannulate the bile duct without success. The procedure was aborted.   08/16/2017 ERCP with placement of a metal biliary stent in the common bile duct by Cory Kelly at [address] [facility].  Cycle 1 FOLFIRINOX 08/31/2017 2. Hypertension 3. Port-A-Cath placement 08/30/2017 4. History of elevated bilirubin-question Gilbert's syndrome 5. Mild leukopenia, thrombocytopenia-question secondary to portal hypertension/splenomegaly     Disposition: Cory Kelly appears stable.  The plan is to proceed with cycle 1 FOLFIRINOX today as scheduled.  We again reviewed potential toxicities.  He agrees to proceed.  We reviewed today's labs.  The bilirubin remains mildly elevated.  Other liver enzymes continue to improve.  We reviewed labs dating as far back as 02/10/2007 in the electronic medical record.  Bilirubin mildly elevated at that time as well.  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  We also reviewed the CBC from today and previous CBCs.  He has  mild leukopenia and thrombocytopenia, new over the past 1 month.  This is likely related to portal hypertension/splenomegaly.  We will continue to monitor the blood counts closely.  He will return for labs and a follow-up visit in 2 weeks.  He will contact the office in the interim with any problems.  Patient seen with Cory Kelly.  25 minutes were spent face-to-face at today's visit with the majority of that time involved in counseling/coordination of care.    Cory Kelly ANP/GNP-BC   08/31/2017  10:32 AM This was a shared visit with Cory Kelly.  Cory Kelly appears stable.  The persistent mild elevation of the bilirubin is likely related to Gilbert's as the bilirubin has been chronically elevated.  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  We reviewed the plan for neoadjuvant FOLFIRINOX with Cory Kelly.  He agrees to proceed.  Cory Kelly is in agreement with the plan for neoadjuvant FOLFIRINOX.  Cory Manson, MD

## 2017-08-31 NOTE — Patient Instructions (Signed)
[facility] Discharge Instructions for Patients Receiving Chemotherapy  Today you received the following chemotherapy agents Oxaliplatin, Leucovorin, Irinotecan, 5-Fluorouracil.   To help prevent nausea and vomiting after your treatment, we encourage you to take your nausea medication as directed.   If you develop nausea and vomiting that is not controlled by your nausea medication, call the clinic.   BELOW ARE SYMPTOMS THAT SHOULD BE REPORTED IMMEDIATELY:  *FEVER GREATER THAN 100.5 F  *CHILLS WITH OR WITHOUT FEVER  NAUSEA AND VOMITING THAT IS NOT CONTROLLED WITH YOUR NAUSEA MEDICATION  *UNUSUAL SHORTNESS OF BREATH  *UNUSUAL BRUISING OR BLEEDING  TENDERNESS IN MOUTH AND THROAT WITH OR WITHOUT PRESENCE OF ULCERS  *URINARY PROBLEMS  *BOWEL PROBLEMS  UNUSUAL RASH Items with * indicate a potential emergency and should be followed up as soon as possible.  Feel free to call the clinic should you have any questions or concerns. The clinic phone number is [phone].  Please show the [facility] at check-in to the Emergency Department and triage nurse. Oxaliplatin Injection What is this medicine? OXALIPLATIN (ox AL i PLA tin) is a chemotherapy drug. It targets fast dividing cells, like cancer cells, and causes these cells to die. This medicine is used to treat cancers of the colon and rectum, and many other cancers. This medicine may be used for other purposes; ask your health care provider or pharmacist if you have questions. COMMON BRAND NAME(S): Eloxatin What should I tell my health care provider before I take this medicine? They need to know if you have any of these conditions: -kidney disease -an unusual or allergic reaction to oxaliplatin, other chemotherapy, other medicines, foods, dyes, or preservatives -pregnant or trying to get pregnant -breast-feeding How should I use this medicine? This drug is given as an infusion into a vein. It is administered  in a hospital or clinic by a specially trained health care professional. Talk to your pediatrician regarding the use of this medicine in children. Special care may be needed. Overdosage: If you think you have taken too much of this medicine contact a poison control center or emergency room at once. NOTE: This medicine is only for you. Do not share this medicine with others. What if I miss a dose? It is important not to miss a dose. Call your doctor or health care professional if you are unable to keep an appointment. What may interact with this medicine? -medicines to increase blood counts like filgrastim, pegfilgrastim, sargramostim -probenecid -some antibiotics like amikacin, gentamicin, neomycin, polymyxin B, streptomycin, tobramycin -zalcitabine Talk to your doctor or health care professional before taking any of these medicines: -acetaminophen -aspirin -ibuprofen -ketoprofen -naproxen This list may not describe all possible interactions. Give your health care provider a list of all the medicines, herbs, non-prescription drugs, or dietary supplements you use. Also tell them if you smoke, drink alcohol, or use illegal drugs. Some items may interact with your medicine. What should I watch for while using this medicine? Your condition will be monitored carefully while you are receiving this medicine. You will need important blood work done while you are taking this medicine. This medicine can make you more sensitive to cold. Do not drink cold drinks or use ice. Cover exposed skin before coming in contact with cold temperatures or cold objects. When out in cold weather wear warm clothing and cover your mouth and nose to warm the air that goes into your lungs. Tell your doctor if you get sensitive to the cold.  This drug may make you feel generally unwell. This is not uncommon, as chemotherapy can affect healthy cells as well as cancer cells. Report any side effects. Continue your course of  treatment even though you feel ill unless your doctor tells you to stop. In some cases, you may be given additional medicines to help with side effects. Follow all directions for their use. Call your doctor or health care professional for advice if you get a fever, chills or sore throat, or other symptoms of a cold or flu. Do not treat yourself. This drug decreases your body's ability to fight infections. Try to avoid being around people who are sick. This medicine may increase your risk to bruise or bleed. Call your doctor or health care professional if you notice any unusual bleeding. Be careful brushing and flossing your teeth or using a toothpick because you may get an infection or bleed more easily. If you have any dental work done, tell your dentist you are receiving this medicine. Avoid taking products that contain aspirin, acetaminophen, ibuprofen, naproxen, or ketoprofen unless instructed by your doctor. These medicines may hide a fever. Do not become pregnant while taking this medicine. Women should inform their doctor if they wish to become pregnant or think they might be pregnant. There is a potential for serious side effects to an unborn child. Talk to your health care professional or pharmacist for more information. Do not breast-feed an infant while taking this medicine. Call your doctor or health care professional if you get diarrhea. Do not treat yourself. What side effects may I notice from receiving this medicine? Side effects that you should report to your doctor or health care professional as soon as possible: -allergic reactions like skin rash, itching or hives, swelling of the face, lips, or tongue -low blood counts - This drug may decrease the number of white blood cells, red blood cells and platelets. You may be at increased risk for infections and bleeding. -signs of infection - fever or chills, cough, sore throat, pain or difficulty passing urine -signs of decreased platelets  or bleeding - bruising, pinpoint red spots on the skin, black, tarry stools, nosebleeds -signs of decreased red blood cells - unusually weak or tired, fainting spells, lightheadedness -breathing problems -chest pain, pressure -cough -diarrhea -jaw tightness -mouth sores -nausea and vomiting -pain, swelling, redness or irritation at the injection site -pain, tingling, numbness in the hands or feet -problems with balance, talking, walking -redness, blistering, peeling or loosening of the skin, including inside the mouth -trouble passing urine or change in the amount of urine Side effects that usually do not require medical attention (report to your doctor or health care professional if they continue or are bothersome): -changes in vision -constipation -hair loss -loss of appetite -metallic taste in the mouth or changes in taste -stomach pain This list may not describe all possible side effects. Call your doctor for medical advice about side effects. You may report side effects to FDA at 1-800-FDA-1088. Where should I keep my medicine? This drug is given in a hospital or clinic and will not be stored at home. NOTE: This sheet is a summary. It may not cover all possible information. If you have questions about this medicine, talk to your doctor, pharmacist, or health care provider.  2018 Elsevier/Gold Standard (2008-01-15 17:22:47) Irinotecan injection What is this medicine? IRINOTECAN (ir in oh TEE kan ) is a chemotherapy drug. It is used to treat colon and rectal cancer. This medicine may be  used for other purposes; ask your health care provider or pharmacist if you have questions. COMMON BRAND NAME(S): Camptosar What should I tell my health care provider before I take this medicine? They need to know if you have any of these conditions: -blood disorders -dehydration -diarrhea -infection (especially a virus infection such as chickenpox, cold sores, or herpes) -liver disease -low  blood counts, like low white cell, platelet, or red cell counts -recent or ongoing radiation therapy -an unusual or allergic reaction to irinotecan, sorbitol, other chemotherapy, other medicines, foods, dyes, or preservatives -pregnant or trying to get pregnant -breast-feeding How should I use this medicine? This drug is given as an infusion into a vein. It is administered in a hospital or clinic by a specially trained health care professional. Talk to your pediatrician regarding the use of this medicine in children. Special care may be needed. Overdosage: If you think you have taken too much of this medicine contact a poison control center or emergency room at once. NOTE: This medicine is only for you. Do not share this medicine with others. What if I miss a dose? It is important not to miss your dose. Call your doctor or health care professional if you are unable to keep an appointment. What may interact with this medicine? Do not take this medicine with any of the following medications: -atazanavir -certain medicines for fungal infections like itraconazole and ketoconazole -St. John's Wort This medicine may also interact with the following medications: -dexamethasone -diuretics -laxatives -medicines for seizures like carbamazepine, mephobarbital, phenobarbital, phenytoin, primidone -medicines to increase blood counts like filgrastim, pegfilgrastim, sargramostim -prochlorperazine -vaccines This list may not describe all possible interactions. Give your health care provider a list of all the medicines, herbs, non-prescription drugs, or dietary supplements you use. Also tell them if you smoke, drink alcohol, or use illegal drugs. Some items may interact with your medicine. What should I watch for while using this medicine? Your condition will be monitored carefully while you are receiving this medicine. You will need important blood work done while you are taking this medicine. This drug  may make you feel generally unwell. This is not uncommon, as chemotherapy can affect healthy cells as well as cancer cells. Report any side effects. Continue your course of treatment even though you feel ill unless your doctor tells you to stop. In some cases, you may be given additional medicines to help with side effects. Follow all directions for their use. You may get drowsy or dizzy. Do not drive, use machinery, or do anything that needs mental alertness until you know how this medicine affects you. Do not stand or sit up quickly, especially if you are an older patient. This reduces the risk of dizzy or fainting spells. Call your doctor or health care professional for advice if you get a fever, chills or sore throat, or other symptoms of a cold or flu. Do not treat yourself. This drug decreases your body's ability to fight infections. Try to avoid being around people who are sick. This medicine may increase your risk to bruise or bleed. Call your doctor or health care professional if you notice any unusual bleeding. Be careful brushing and flossing your teeth or using a toothpick because you may get an infection or bleed more easily. If you have any dental work done, tell your dentist you are receiving this medicine. Avoid taking products that contain aspirin, acetaminophen, ibuprofen, naproxen, or ketoprofen unless instructed by your doctor. These medicines may hide a fever.  Do not become pregnant while taking this medicine. Women should inform their doctor if they wish to become pregnant or think they might be pregnant. There is a potential for serious side effects to an unborn child. Talk to your health care professional or pharmacist for more information. Do not breast-feed an infant while taking this medicine. What side effects may I notice from receiving this medicine? Side effects that you should report to your doctor or health care professional as soon as possible: -allergic reactions like  skin rash, itching or hives, swelling of the face, lips, or tongue -low blood counts - this medicine may decrease the number of white blood cells, red blood cells and platelets. You may be at increased risk for infections and bleeding. -signs of infection - fever or chills, cough, sore throat, pain or difficulty passing urine -signs of decreased platelets or bleeding - bruising, pinpoint red spots on the skin, black, tarry stools, blood in the urine -signs of decreased red blood cells - unusually weak or tired, fainting spells, lightheadedness -breathing problems -chest pain -diarrhea -feeling faint or lightheaded, falls -flushing, runny nose, sweating during infusion -mouth sores or pain -pain, swelling, redness or irritation where injected -pain, swelling, warmth in the leg -pain, tingling, numbness in the hands or feet -problems with balance, talking, walking -stomach cramps, pain -trouble passing urine or change in the amount of urine -vomiting as to be unable to hold down drinks or food -yellowing of the eyes or skin Side effects that usually do not require medical attention (report to your doctor or health care professional if they continue or are bothersome): -constipation -hair loss -headache -loss of appetite -nausea, vomiting -stomach upset This list may not describe all possible side effects. Call your doctor for medical advice about side effects. You may report side effects to FDA at 1-800-FDA-1088. Where should I keep my medicine? This drug is given in a hospital or clinic and will not be stored at home. NOTE: This sheet is a summary. It may not cover all possible information. If you have questions about this medicine, talk to your doctor, pharmacist, or health care provider.  2018 Else[employer]/Gold Standard (2012-12-17 16:29:32)  Fluorouracil, 5-FU injection What is this medicine? FLUOROURACIL, 5-FU (flure oh YOOR a sil) is a chemotherapy drug. It slows the growth of  cancer cells. This medicine is used to treat many types of cancer like breast cancer, colon or rectal cancer, pancreatic cancer, and stomach cancer. This medicine may be used for other purposes; ask your health care provider or pharmacist if you have questions. COMMON BRAND NAME(S): Adrucil What should I tell my health care provider before I take this medicine? They need to know if you have any of these conditions: -blood disorders -dihydropyrimidine dehydrogenase (DPD) deficiency -infection (especially a virus infection such as chickenpox, cold sores, or herpes) -kidney disease -liver disease -malnourished, poor nutrition -recent or ongoing radiation therapy -an unusual or allergic reaction to fluorouracil, other chemotherapy, other medicines, foods, dyes, or preservatives -pregnant or trying to get pregnant -breast-feeding How should I use this medicine? This drug is given as an infusion or injection into a vein. It is administered in a hospital or clinic by a specially trained health care professional. Talk to your pediatrician regarding the use of this medicine in children. Special care may be needed. Overdosage: If you think you have taken too much of this medicine contact a poison control center or emergency room at once. NOTE: This medicine is only for you.  Do not share this medicine with others. What if I miss a dose? It is important not to miss your dose. Call your doctor or health care professional if you are unable to keep an appointment. What may interact with this medicine? -allopurinol -cimetidine -dapsone -digoxin -hydroxyurea -leucovorin -levamisole -medicines for seizures like ethotoin, fosphenytoin, phenytoin -medicines to increase blood counts like filgrastim, pegfilgrastim, sargramostim -medicines that treat or prevent blood clots like warfarin, enoxaparin, and dalteparin -methotrexate -metronidazole -pyrimethamine -some other chemotherapy drugs like busulfan,  cisplatin, estramustine, vinblastine -trimethoprim -trimetrexate -vaccines Talk to your doctor or health care professional before taking any of these medicines: -acetaminophen -aspirin -ibuprofen -ketoprofen -naproxen This list may not describe all possible interactions. Give your health care provider a list of all the medicines, herbs, non-prescription drugs, or dietary supplements you use. Also tell them if you smoke, drink alcohol, or use illegal drugs. Some items may interact with your medicine. What should I watch for while using this medicine? Visit your doctor for checks on your progress. This drug may make you feel generally unwell. This is not uncommon, as chemotherapy can affect healthy cells as well as cancer cells. Report any side effects. Continue your course of treatment even though you feel ill unless your doctor tells you to stop. In some cases, you may be given additional medicines to help with side effects. Follow all directions for their use. Call your doctor or health care professional for advice if you get a fever, chills or sore throat, or other symptoms of a cold or flu. Do not treat yourself. This drug decreases your body's ability to fight infections. Try to avoid being around people who are sick. This medicine may increase your risk to bruise or bleed. Call your doctor or health care professional if you notice any unusual bleeding. Be careful brushing and flossing your teeth or using a toothpick because you may get an infection or bleed more easily. If you have any dental work done, tell your dentist you are receiving this medicine. Avoid taking products that contain aspirin, acetaminophen, ibuprofen, naproxen, or ketoprofen unless instructed by your doctor. These medicines may hide a fever. Do not become pregnant while taking this medicine. Women should inform their doctor if they wish to become pregnant or think they might be pregnant. There is a potential for serious  side effects to an unborn child. Talk to your health care professional or pharmacist for more information. Do not breast-feed an infant while taking this medicine. Men should inform their doctor if they wish to father a child. This medicine may lower sperm counts. Do not treat diarrhea with over the counter products. Contact your doctor if you have diarrhea that lasts more than 2 days or if it is severe and watery. This medicine can make you more sensitive to the sun. Keep out of the sun. If you cannot avoid being in the sun, wear protective clothing and use sunscreen. Do not use sun lamps or tanning beds/booths. What side effects may I notice from receiving this medicine? Side effects that you should report to your doctor or health care professional as soon as possible: -allergic reactions like skin rash, itching or hives, swelling of the face, lips, or tongue -low blood counts - this medicine may decrease the number of white blood cells, red blood cells and platelets. You may be at increased risk for infections and bleeding. -signs of infection - fever or chills, cough, sore throat, pain or difficulty passing urine -signs of decreased platelets  or bleeding - bruising, pinpoint red spots on the skin, black, tarry stools, blood in the urine -signs of decreased red blood cells - unusually weak or tired, fainting spells, lightheadedness -breathing problems -changes in vision -chest pain -mouth sores -nausea and vomiting -pain, swelling, redness at site where injected -pain, tingling, numbness in the hands or feet -redness, swelling, or sores on hands or feet -stomach pain -unusual bleeding Side effects that usually do not require medical attention (report to your doctor or health care professional if they continue or are bothersome): -changes in finger or toe nails -diarrhea -dry or itchy skin -hair loss -headache -loss of appetite -sensitivity of eyes to the light -stomach  upset -unusually teary eyes This list may not describe all possible side effects. Call your doctor for medical advice about side effects. You may report side effects to FDA at 1-800-FDA-1088. Where should I keep my medicine? This drug is given in a hospital or clinic and will not be stored at home. NOTE: This sheet is a summary. It may not cover all possible information. If you have questions about this medicine, talk to your doctor, pharmacist, or health care provider.  2018 [employer]/Gold Standard (2007-10-24 13:53:16) Leucovorin injection What is this medicine? LEUCOVORIN (loo koe VOR in) is used to prevent or treat the harmful effects of some medicines. This medicine is used to treat anemia caused by a low amount of folic acid in the body. It is also used with 5-fluorouracil (5-FU) to treat colon cancer. This medicine may be used for other purposes; ask your health care provider or pharmacist if you have questions. What should I tell my health care provider before I take this medicine? They need to know if you have any of these conditions: -anemia from low levels of vitamin B-12 in the blood -an unusual or allergic reaction to leucovorin, folic acid, other medicines, foods, dyes, or preservatives -pregnant or trying to get pregnant -breast-feeding How should I use this medicine? This medicine is for injection into a muscle or into a vein. It is given by a health care professional in a hospital or clinic setting. Talk to your pediatrician regarding the use of this medicine in children. Special care may be needed. Overdosage: If you think you have taken too much of this medicine contact a poison control center or emergency room at once. NOTE: This medicine is only for you. Do not share this medicine with others. What if I miss a dose? This does not apply. What may interact with this  medicine? -capecitabine -fluorouracil -phenobarbital -phenytoin -primidone -trimethoprim-sulfamethoxazole This list may not describe all possible interactions. Give your health care provider a list of all the medicines, herbs, non-prescription drugs, or dietary supplements you use. Also tell them if you smoke, drink alcohol, or use illegal drugs. Some items may interact with your medicine. What should I watch for while using this medicine? Your condition will be monitored carefully while you are receiving this medicine. This medicine may increase the side effects of 5-fluorouracil, 5-FU. Tell your doctor or health care professional if you have diarrhea or mouth sores that do not get better or that get worse. What side effects may I notice from receiving this medicine? Side effects that you should report to your doctor or health care professional as soon as possible: -allergic reactions like skin rash, itching or hives, swelling of the face, lips, or tongue -breathing problems -fever, infection -mouth sores -unusual bleeding or bruising -unusually weak or tired Side effects that  usually do not require medical attention (report to your doctor or health care professional if they continue or are bothersome): -constipation or diarrhea -loss of appetite -nausea, vomiting This list may not describe all possible side effects. Call your doctor for medical advice about side effects. You may report side effects to FDA at 1-800-FDA-1088. Where should I keep my medicine? This drug is given in a hospital or clinic and will not be stored at home. NOTE: This sheet is a summary. It may not cover all possible information. If you have questions about this medicine, talk to your doctor, pharmacist, or health care provider.  2018 Elsevier/Gold Standard (2007-12-25 16:50:29)

## 2017-08-31 NOTE — Telephone Encounter (Signed)
No 2/28 los  °

## 2017-09-01 ENCOUNTER — Telehealth: Payer: Self-pay | Admitting: *Deleted

## 2017-09-01 LAB — CANCER ANTIGEN 19-9: CAN 19-9: 1734 U/mL — AB (ref 0–35)

## 2017-09-01 NOTE — Telephone Encounter (Signed)
Chemo Follow up Call attempted. Wife reports pt is asleep after not sleeping well last night. Reviewed oral hydration instructions and office call back #. She voiced understanding.

## 2017-09-02 ENCOUNTER — Inpatient Hospital Stay: Payer: BLUE CROSS/BLUE SHIELD | Attending: Nurse Practitioner

## 2017-09-02 VITALS — BP 152/90 | HR 60 | Temp 98.1°F | Resp 16

## 2017-09-02 DIAGNOSIS — Z5189 Encounter for other specified aftercare: Secondary | ICD-10-CM | POA: Insufficient documentation

## 2017-09-02 DIAGNOSIS — C250 Malignant neoplasm of head of pancreas: Secondary | ICD-10-CM | POA: Insufficient documentation

## 2017-09-02 DIAGNOSIS — Z5111 Encounter for antineoplastic chemotherapy: Secondary | ICD-10-CM | POA: Diagnosis not present

## 2017-09-02 MED ORDER — HEPARIN SOD (PORK) LOCK FLUSH 100 UNIT/ML IV SOLN
500.0000 [IU] | Freq: Once | INTRAVENOUS | Status: AC | PRN
  Administered 2017-09-02: 500 [IU]
  Filled 2017-09-02: qty 5

## 2017-09-02 MED ORDER — PEGFILGRASTIM-CBQV 6 MG/0.6ML ~~LOC~~ SOSY
PREFILLED_SYRINGE | SUBCUTANEOUS | Status: AC
  Filled 2017-09-02: qty 0.6

## 2017-09-02 MED ORDER — PEGFILGRASTIM-CBQV 6 MG/0.6ML ~~LOC~~ SOSY
6.0000 mg | PREFILLED_SYRINGE | Freq: Once | SUBCUTANEOUS | Status: AC
  Administered 2017-09-02: 6 mg via SUBCUTANEOUS

## 2017-09-02 MED ORDER — SODIUM CHLORIDE 0.9% FLUSH
10.0000 mL | INTRAVENOUS | Status: DC | PRN
  Administered 2017-09-02: 10 mL
  Filled 2017-09-02: qty 10

## 2017-09-02 NOTE — Patient Instructions (Signed)
Pegfilgrastim injection What is this medicine? PEGFILGRASTIM (PEG fil gra stim) is a long-acting granulocyte colony-stimulating factor that stimulates the growth of neutrophils, a type of white blood cell important in the body's fight against infection. It is used to reduce the incidence of fever and infection in patients with certain types of cancer who are receiving chemotherapy that affects the bone marrow, and to increase survival after being exposed to high doses of radiation. This medicine may be used for other purposes; ask your health care provider or pharmacist if you have questions. COMMON BRAND NAME(S): Neulasta What should I tell my health care provider before I take this medicine? They need to know if you have any of these conditions: -kidney disease -latex allergy -ongoing radiation therapy -sickle cell disease -skin reactions to acrylic adhesives (On-Body Injector only) -an unusual or allergic reaction to pegfilgrastim, filgrastim, other medicines, foods, dyes, or preservatives -pregnant or trying to get pregnant -breast-feeding How should I use this medicine? This medicine is for injection under the skin. If you get this medicine at home, you will be taught how to prepare and give the pre-filled syringe or how to use the On-body Injector. Refer to the patient Instructions for Use for detailed instructions. Use exactly as directed. Tell your healthcare provider immediately if you suspect that the On-body Injector may not have performed as intended or if you suspect the use of the On-body Injector resulted in a missed or partial dose. It is important that you put your used needles and syringes in a special sharps container. Do not put them in a trash can. If you do not have a sharps container, call your pharmacist or healthcare provider to get one. Talk to your pediatrician regarding the use of this medicine in children. While this drug may be prescribed for selected conditions,  precautions do apply. Overdosage: If you think you have taken too much of this medicine contact a poison control center or emergency room at once. NOTE: This medicine is only for you. Do not share this medicine with others. What if I miss a dose? It is important not to miss your dose. Call your doctor or health care professional if you miss your dose. If you miss a dose due to an On-body Injector failure or leakage, a new dose should be administered as soon as possible using a single prefilled syringe for manual use. What may interact with this medicine? Interactions have not been studied. Give your health care provider a list of all the medicines, herbs, non-prescription drugs, or dietary supplements you use. Also tell them if you smoke, drink alcohol, or use illegal drugs. Some items may interact with your medicine. This list may not describe all possible interactions. Give your health care provider a list of all the medicines, herbs, non-prescription drugs, or dietary supplements you use. Also tell them if you smoke, drink alcohol, or use illegal drugs. Some items may interact with your medicine. What should I watch for while using this medicine? You may need blood work done while you are taking this medicine. If you are going to need a MRI, CT scan, or other procedure, tell your doctor that you are using this medicine (On-Body Injector only). What side effects may I notice from receiving this medicine? Side effects that you should report to your doctor or health care professional as soon as possible: -allergic reactions like skin rash, itching or hives, swelling of the face, lips, or tongue -dizziness -fever -pain, redness, or irritation at site   where injected -pinpoint red spots on the skin -red or dark-brown urine -shortness of breath or breathing problems -stomach or side pain, or pain at the shoulder -swelling -tiredness -trouble passing urine or change in the amount of urine Side  effects that usually do not require medical attention (report to your doctor or health care professional if they continue or are bothersome): -bone pain -muscle pain This list may not describe all possible side effects. Call your doctor for medical advice about side effects. You may report side effects to FDA at 1-800-FDA-1088. Where should I keep my medicine? Keep out of the reach of children. Store pre-filled syringes in a refrigerator between 2 and 8 degrees C (36 and 46 degrees F). Do not freeze. Keep in carton to protect from light. Throw away this medicine if it is left out of the refrigerator for more than 48 hours. Throw away any unused medicine after the expiration date. NOTE: This sheet is a summary. It may not cover all possible information. If you have questions about this medicine, talk to your doctor, pharmacist, or health care provider.  2018 Elsevier/Gold Standard (2016-06-16 12:58:03)  

## 2017-09-04 ENCOUNTER — Encounter: Payer: Self-pay | Admitting: Nurse Practitioner

## 2017-09-06 ENCOUNTER — Encounter: Payer: Self-pay | Admitting: Nurse Practitioner

## 2017-09-07 ENCOUNTER — Other Ambulatory Visit: Payer: Self-pay | Admitting: Oncology

## 2017-09-08 ENCOUNTER — Telehealth: Payer: Self-pay | Admitting: Oncology

## 2017-09-08 NOTE — Telephone Encounter (Signed)
@   8:56 am spoke with patient to confirm his FMLA paperwork is ready for pick-up.  He instructed me to leave it with front desk receptionist for pick up.

## 2017-09-12 ENCOUNTER — Encounter: Payer: Self-pay | Admitting: Nurse Practitioner

## 2017-09-14 ENCOUNTER — Inpatient Hospital Stay (HOSPITAL_BASED_OUTPATIENT_CLINIC_OR_DEPARTMENT_OTHER): Payer: BLUE CROSS/BLUE SHIELD | Admitting: Nurse Practitioner

## 2017-09-14 ENCOUNTER — Encounter: Payer: Self-pay | Admitting: Nurse Practitioner

## 2017-09-14 ENCOUNTER — Encounter: Payer: Self-pay | Admitting: *Deleted

## 2017-09-14 ENCOUNTER — Inpatient Hospital Stay: Payer: BLUE CROSS/BLUE SHIELD

## 2017-09-14 ENCOUNTER — Telehealth: Payer: Self-pay | Admitting: Nurse Practitioner

## 2017-09-14 VITALS — BP 151/74 | HR 55 | Temp 98.2°F | Resp 17 | Ht 72.0 in | Wt 225.2 lb

## 2017-09-14 DIAGNOSIS — Z5111 Encounter for antineoplastic chemotherapy: Secondary | ICD-10-CM | POA: Diagnosis not present

## 2017-09-14 DIAGNOSIS — R194 Change in bowel habit: Secondary | ICD-10-CM | POA: Diagnosis not present

## 2017-09-14 DIAGNOSIS — D72829 Elevated white blood cell count, unspecified: Secondary | ICD-10-CM

## 2017-09-14 DIAGNOSIS — Z5189 Encounter for other specified aftercare: Secondary | ICD-10-CM | POA: Diagnosis not present

## 2017-09-14 DIAGNOSIS — D696 Thrombocytopenia, unspecified: Secondary | ICD-10-CM | POA: Diagnosis not present

## 2017-09-14 DIAGNOSIS — I10 Essential (primary) hypertension: Secondary | ICD-10-CM

## 2017-09-14 DIAGNOSIS — E876 Hypokalemia: Secondary | ICD-10-CM

## 2017-09-14 DIAGNOSIS — C250 Malignant neoplasm of head of pancreas: Secondary | ICD-10-CM | POA: Diagnosis not present

## 2017-09-14 LAB — CMP (CANCER CENTER ONLY)
ALT: 117 U/L — AB (ref 0–55)
AST: 56 U/L — AB (ref 5–34)
Albumin: 3 g/dL — ABNORMAL LOW (ref 3.5–5.0)
Alkaline Phosphatase: 292 U/L — ABNORMAL HIGH (ref 40–150)
Anion gap: 8 (ref 3–11)
BILIRUBIN TOTAL: 0.8 mg/dL (ref 0.2–1.2)
BUN: 7 mg/dL (ref 7–26)
CO2: 28 mmol/L (ref 22–29)
CREATININE: 0.99 mg/dL (ref 0.70–1.30)
Calcium: 8.8 mg/dL (ref 8.4–10.4)
Chloride: 104 mmol/L (ref 98–109)
GFR, Est AFR Am: >60 mL/min (ref >60)
Glucose, Bld: 149 mg/dL — ABNORMAL HIGH (ref 70–140)
POTASSIUM: 3.3 mmol/L — AB (ref 3.5–5.1)
Sodium: 140 mmol/L (ref 136–145)
TOTAL PROTEIN: 5.7 g/dL — AB (ref 6.4–8.3)

## 2017-09-14 LAB — CBC WITH DIFFERENTIAL (CANCER CENTER ONLY)
BASOS ABS: 0 10*3/uL (ref 0.0–0.1)
Basophils Relative: 0 %
EOS ABS: 0.1 10*3/uL (ref 0.0–0.5)
EOS PCT: 2 %
HCT: 33.9 % — ABNORMAL LOW (ref 38.4–49.9)
Hemoglobin: 11.7 g/dL — ABNORMAL LOW (ref 13.0–17.1)
Lymphocytes Relative: 12 %
Lymphs Abs: 0.7 10*3/uL — ABNORMAL LOW (ref 0.9–3.3)
MCH: 28.5 pg (ref 27.2–33.4)
MCHC: 34.6 g/dL (ref 32.0–36.0)
MCV: 82.4 fL (ref 79.3–98.0)
Monocytes Absolute: 0.3 10*3/uL (ref 0.1–0.9)
Monocytes Relative: 6 %
Neutro Abs: 4.6 10*3/uL (ref 1.5–6.5)
Neutrophils Relative %: 80 %
PLATELETS: 115 10*3/uL — AB (ref 140–400)
RBC: 4.11 MIL/uL — AB (ref 4.20–5.82)
RDW: 13.9 % (ref 11.0–14.6)
WBC: 5.7 10*3/uL (ref 4.0–10.3)

## 2017-09-14 MED ORDER — OXALIPLATIN CHEMO INJECTION 100 MG/20ML
85.0000 mg/m2 | Freq: Once | INTRAVENOUS | Status: AC
  Administered 2017-09-14: 200 mg via INTRAVENOUS
  Filled 2017-09-14: qty 40

## 2017-09-14 MED ORDER — PALONOSETRON HCL INJECTION 0.25 MG/5ML
0.2500 mg | Freq: Once | INTRAVENOUS | Status: AC
  Administered 2017-09-14: 0.25 mg via INTRAVENOUS

## 2017-09-14 MED ORDER — ATROPINE SULFATE 1 MG/ML IJ SOLN
0.5000 mg | Freq: Once | INTRAMUSCULAR | Status: AC | PRN
  Administered 2017-09-14: 0.5 mg via INTRAVENOUS

## 2017-09-14 MED ORDER — ATROPINE SULFATE 1 MG/ML IJ SOLN
INTRAMUSCULAR | Status: AC
  Filled 2017-09-14: qty 1

## 2017-09-14 MED ORDER — SODIUM CHLORIDE 0.9 % IV SOLN
150.0000 mg/m2 | Freq: Once | INTRAVENOUS | Status: AC
  Administered 2017-09-14: 360 mg via INTRAVENOUS
  Filled 2017-09-14: qty 18

## 2017-09-14 MED ORDER — SODIUM CHLORIDE 0.9 % IV SOLN
2400.0000 mg/m2 | INTRAVENOUS | Status: DC
  Administered 2017-09-14: 5700 mg via INTRAVENOUS
  Filled 2017-09-14: qty 114

## 2017-09-14 MED ORDER — SODIUM CHLORIDE 0.9 % IV SOLN
Freq: Once | INTRAVENOUS | Status: AC
  Administered 2017-09-14: 12:00:00 via INTRAVENOUS
  Filled 2017-09-14: qty 5

## 2017-09-14 MED ORDER — SODIUM CHLORIDE 0.9 % IV SOLN
400.0000 mg/m2 | Freq: Once | INTRAVENOUS | Status: AC
  Administered 2017-09-14: 952 mg via INTRAVENOUS
  Filled 2017-09-14: qty 47.6

## 2017-09-14 MED ORDER — DEXTROSE 5 % IV SOLN
Freq: Once | INTRAVENOUS | Status: AC
  Administered 2017-09-14: 12:00:00 via INTRAVENOUS

## 2017-09-14 MED ORDER — PALONOSETRON HCL INJECTION 0.25 MG/5ML
INTRAVENOUS | Status: AC
  Filled 2017-09-14: qty 5

## 2017-09-14 NOTE — Telephone Encounter (Signed)
Scheduled appt per 3/14 los - patient to get an updated schedule next visit.  

## 2017-09-14 NOTE — Progress Notes (Signed)
  [facility] OFFICE PROGRESS NOTE   Diagnosis: Pancreas cancer  INTERVAL HISTORY:   Cory Kelly returns as scheduled.  He completed cycle 1 FOLFIRINOX 08/31/2017.  He denies nausea/vomiting.  No mouth sores.  No significant diarrhea.  He thinks he may have had one loose stool.  Cold sensitivity lasted 3 or 4 days.  He notes fatigue late in the day.  Main complaint is "gas" with associated discomfort.  He is having a daily bowel movement but notes volume of stool is less.  Objective:  Vital signs in last 24 hours:  Blood pressure (!) 151/74, pulse (!) 55, temperature 98.2 F (36.8 C), temperature source Oral, resp. rate 17, height 6' (1.829 m), weight 225 lb 3.2 oz (102.2 kg), SpO2 100 %.    HEENT: No thrush or ulcers. Resp: Lungs clear bilaterally. Cardio: Regular rate and rhythm. GI: Abdomen is soft and nontender.  No hepatomegaly.  No mass.  No apparent ascites. Vascular: No leg edema.  Calves soft and nontender. Neuro: Alert and oriented. Skin: Palms without erythema. Port-A-Cath without erythema.   Lab Results:  Lab Results  Component Value Date   WBC 5.7 09/14/2017   HGB 13.0 08/30/2017   HCT 33.9 (L) 09/14/2017   MCV 82.4 09/14/2017   PLT 115 (L) 09/14/2017   NEUTROABS 4.6 09/14/2017    Imaging:  No results found.  Medications: I have reviewed the patient's current medications.  Assessment/Plan: 1. Pancreas head mass  CT abdomen/pelvis 08/03/2017-fullness in the head of the pancreas with stranding in the peripancreatic fat and pancreatic ductal dilatation.   MRI abdomen 08/04/2017-findings favored to represent acute on chronic pancreatitis; equivocal soft tissue fullness within the head and uncinate process of the pancreas; indeterminate too small to characterize right hepatic lobe lesion; small volume abdominal ascites; splenomegaly.   CA-19-9 elevated at 977on 08/04/2017.  Status postupper EUS 08/10/2017-findings of an irregular masslike region  in the pancreatic head measuring approximately 2.7 cm. This directly abutted the main portal vein but no other major vascular structures. Biopsies obtained with preliminary cytology positive for malignancy, likely adenocarcinoma. The final report is pending. The common bile duct and main pancreatic duct were both dilated. ERCP was then proceeded with forstent placement. Multiple attempts were made tocannulate the bile duct without success. The procedure was aborted.  08/16/2017 ERCP with placement of a metal biliary stent in the common bile duct by Dr. Francella Solian at [facility].  Cycle 1 FOLFIRINOX 08/31/2017  Cycle 2 FOLFIRINOX 09/14/2017 2. Hypertension 3. Port-A-Cath placement 08/30/2017 4. History of elevated bilirubin-question Gilbert's syndrome 5. Mild leukopenia, thrombocytopenia-question secondary to portal hypertension/splenomegaly; 09/14/2017 white count improved, platelet count stable     Disposition: [name] appears stable.  He has completed 1 cycle of FOLFIRINOX.  Plan to proceed with cycle 2 today as scheduled.  We reviewed today's labs.  He continues to have mild thrombocytopenia, stable as compared to 2 weeks ago.  The white count is better.  Bilirubin has normalized.  Other LFTs continue to improve.  He has mild hypokalemia.  He will begin with dietary maneuvers to try and improve this.  For the "gas" and smaller volume of stool I recommended he begin MiraLAX daily.  He will return for lab, follow-up and cycle 3 FOLFIRINOX in 2 weeks.  He will contact the office in the interim with any problems.    Ned Card ANP/GNP-BC   09/14/2017  11:52 AM

## 2017-09-14 NOTE — Patient Instructions (Signed)
[facility] Discharge Instructions for Patients Receiving Chemotherapy  Today you received the following chemotherapy agents: Oxaliplatin (Eloxatin), Leucovorin, Irinotecan (Camptosar), Fluorouracil (Adrucil, 5-FU).  To help prevent nausea and vomiting after your treatment, we encourage you to take your nausea medication as prescribed. Received Aloxi during treatment-->take Compazine (not Zofran) for the next 3 days.  If you develop nausea and vomiting that is not controlled by your nausea medication, call the clinic.   BELOW ARE SYMPTOMS THAT SHOULD BE REPORTED IMMEDIATELY:  *FEVER GREATER THAN 100.5 F  *CHILLS WITH OR WITHOUT FEVER  NAUSEA AND VOMITING THAT IS NOT CONTROLLED WITH YOUR NAUSEA MEDICATION  *UNUSUAL SHORTNESS OF BREATH  *UNUSUAL BRUISING OR BLEEDING  TENDERNESS IN MOUTH AND THROAT WITH OR WITHOUT PRESENCE OF ULCERS  *URINARY PROBLEMS  *BOWEL PROBLEMS  UNUSUAL RASH Items with * indicate a potential emergency and should be followed up as soon as possible.  Feel free to call the clinic should you have any questions or concerns. The clinic phone number is [phone].  Please show the [facility] at check-in to the Emergency Department and triage nurse.

## 2017-09-15 NOTE — Progress Notes (Unsigned)
[facility] Social Work  Clinical Social Work was referred by patient's spouse to review and complete healthcare advance directives.  Clinical Social Worker met with patient and spouse in infusion room.  The patient designated spouse, [name], as their primary healthcare agent and daughter, Ander Purpura, as their secondary agent.  Patient also completed healthcare living will.    Clinical Social Worker notarized documents and made copies for patient/family. Clinical Social Worker will send documents to medical records to be scanned into patient's chart. Clinical Social Worker encouraged patient/family to contact with any additional questions or concerns.  Maryjean Morn, MSW, LCSW Clinical Social Worker [facility] [phone]

## 2017-09-16 ENCOUNTER — Inpatient Hospital Stay: Payer: BLUE CROSS/BLUE SHIELD

## 2017-09-16 VITALS — BP 143/82 | HR 48 | Temp 98.0°F

## 2017-09-16 DIAGNOSIS — C250 Malignant neoplasm of head of pancreas: Secondary | ICD-10-CM | POA: Diagnosis not present

## 2017-09-16 DIAGNOSIS — Z5189 Encounter for other specified aftercare: Secondary | ICD-10-CM | POA: Diagnosis not present

## 2017-09-16 DIAGNOSIS — Z5111 Encounter for antineoplastic chemotherapy: Secondary | ICD-10-CM | POA: Diagnosis not present

## 2017-09-16 MED ORDER — SODIUM CHLORIDE 0.9% FLUSH
10.0000 mL | INTRAVENOUS | Status: DC | PRN
  Administered 2017-09-16: 10 mL
  Filled 2017-09-16: qty 10

## 2017-09-16 MED ORDER — HEPARIN SOD (PORK) LOCK FLUSH 100 UNIT/ML IV SOLN
500.0000 [IU] | Freq: Once | INTRAVENOUS | Status: AC | PRN
  Administered 2017-09-16: 500 [IU]
  Filled 2017-09-16: qty 5

## 2017-09-16 MED ORDER — PEGFILGRASTIM-CBQV 6 MG/0.6ML ~~LOC~~ SOSY
6.0000 mg | PREFILLED_SYRINGE | Freq: Once | SUBCUTANEOUS | Status: AC
  Administered 2017-09-16: 6 mg via SUBCUTANEOUS

## 2017-09-16 MED ORDER — PEGFILGRASTIM-CBQV 6 MG/0.6ML ~~LOC~~ SOSY
PREFILLED_SYRINGE | SUBCUTANEOUS | Status: AC
  Filled 2017-09-16: qty 0.6

## 2017-09-16 NOTE — Patient Instructions (Signed)
Pegfilgrastim injection What is this medicine? PEGFILGRASTIM (PEG fil gra stim) is a long-acting granulocyte colony-stimulating factor that stimulates the growth of neutrophils, a type of white blood cell important in the body's fight against infection. It is used to reduce the incidence of fever and infection in patients with certain types of cancer who are receiving chemotherapy that affects the bone marrow, and to increase survival after being exposed to high doses of radiation. This medicine may be used for other purposes; ask your health care provider or pharmacist if you have questions. COMMON BRAND NAME(S): Neulasta What should I tell my health care provider before I take this medicine? They need to know if you have any of these conditions: -kidney disease -latex allergy -ongoing radiation therapy -sickle cell disease -skin reactions to acrylic adhesives (On-Body Injector only) -an unusual or allergic reaction to pegfilgrastim, filgrastim, other medicines, foods, dyes, or preservatives -pregnant or trying to get pregnant -breast-feeding How should I use this medicine? This medicine is for injection under the skin. If you get this medicine at home, you will be taught how to prepare and give the pre-filled syringe or how to use the On-body Injector. Refer to the patient Instructions for Use for detailed instructions. Use exactly as directed. Tell your healthcare provider immediately if you suspect that the On-body Injector may not have performed as intended or if you suspect the use of the On-body Injector resulted in a missed or partial dose. It is important that you put your used needles and syringes in a special sharps container. Do not put them in a trash can. If you do not have a sharps container, call your pharmacist or healthcare provider to get one. Talk to your pediatrician regarding the use of this medicine in children. While this drug may be prescribed for selected conditions,  precautions do apply. Overdosage: If you think you have taken too much of this medicine contact a poison control center or emergency room at once. NOTE: This medicine is only for you. Do not share this medicine with others. What if I miss a dose? It is important not to miss your dose. Call your doctor or health care professional if you miss your dose. If you miss a dose due to an On-body Injector failure or leakage, a new dose should be administered as soon as possible using a single prefilled syringe for manual use. What may interact with this medicine? Interactions have not been studied. Give your health care provider a list of all the medicines, herbs, non-prescription drugs, or dietary supplements you use. Also tell them if you smoke, drink alcohol, or use illegal drugs. Some items may interact with your medicine. This list may not describe all possible interactions. Give your health care provider a list of all the medicines, herbs, non-prescription drugs, or dietary supplements you use. Also tell them if you smoke, drink alcohol, or use illegal drugs. Some items may interact with your medicine. What should I watch for while using this medicine? You may need blood work done while you are taking this medicine. If you are going to need a MRI, CT scan, or other procedure, tell your doctor that you are using this medicine (On-Body Injector only). What side effects may I notice from receiving this medicine? Side effects that you should report to your doctor or health care professional as soon as possible: -allergic reactions like skin rash, itching or hives, swelling of the face, lips, or tongue -dizziness -fever -pain, redness, or irritation at site   where injected -pinpoint red spots on the skin -red or dark-brown urine -shortness of breath or breathing problems -stomach or side pain, or pain at the shoulder -swelling -tiredness -trouble passing urine or change in the amount of urine Side  effects that usually do not require medical attention (report to your doctor or health care professional if they continue or are bothersome): -bone pain -muscle pain This list may not describe all possible side effects. Call your doctor for medical advice about side effects. You may report side effects to FDA at 1-800-FDA-1088. Where should I keep my medicine? Keep out of the reach of children. Store pre-filled syringes in a refrigerator between 2 and 8 degrees C (36 and 46 degrees F). Do not freeze. Keep in carton to protect from light. Throw away this medicine if it is left out of the refrigerator for more than 48 hours. Throw away any unused medicine after the expiration date. NOTE: This sheet is a summary. It may not cover all possible information. If you have questions about this medicine, talk to your doctor, pharmacist, or health care provider.  2018 Elsevier/Gold Standard (2016-06-16 12:58:03)  

## 2017-09-21 ENCOUNTER — Encounter: Payer: Self-pay | Admitting: Oncology

## 2017-09-23 ENCOUNTER — Other Ambulatory Visit: Payer: Self-pay | Admitting: Oncology

## 2017-09-28 ENCOUNTER — Inpatient Hospital Stay (HOSPITAL_BASED_OUTPATIENT_CLINIC_OR_DEPARTMENT_OTHER): Payer: BLUE CROSS/BLUE SHIELD | Admitting: Oncology

## 2017-09-28 ENCOUNTER — Encounter: Payer: Self-pay | Admitting: Oncology

## 2017-09-28 ENCOUNTER — Inpatient Hospital Stay: Payer: BLUE CROSS/BLUE SHIELD

## 2017-09-28 ENCOUNTER — Telehealth: Payer: Self-pay | Admitting: Oncology

## 2017-09-28 VITALS — BP 128/62 | HR 63 | Temp 97.8°F | Resp 17 | Ht 72.0 in | Wt 218.8 lb

## 2017-09-28 DIAGNOSIS — R351 Nocturia: Secondary | ICD-10-CM | POA: Diagnosis not present

## 2017-09-28 DIAGNOSIS — I10 Essential (primary) hypertension: Secondary | ICD-10-CM | POA: Diagnosis not present

## 2017-09-28 DIAGNOSIS — C250 Malignant neoplasm of head of pancreas: Secondary | ICD-10-CM

## 2017-09-28 DIAGNOSIS — Z5111 Encounter for antineoplastic chemotherapy: Secondary | ICD-10-CM | POA: Diagnosis not present

## 2017-09-28 DIAGNOSIS — R1010 Upper abdominal pain, unspecified: Secondary | ICD-10-CM

## 2017-09-28 DIAGNOSIS — Z5189 Encounter for other specified aftercare: Secondary | ICD-10-CM | POA: Diagnosis not present

## 2017-09-28 DIAGNOSIS — R200 Anesthesia of skin: Secondary | ICD-10-CM | POA: Diagnosis not present

## 2017-09-28 LAB — URINALYSIS, COMPLETE (UACMP) WITH MICROSCOPIC
BACTERIA UA: NONE SEEN
Bilirubin Urine: NEGATIVE
Glucose, UA: 50 mg/dL — AB
Ketones, ur: NEGATIVE mg/dL
LEUKOCYTES UA: NEGATIVE
Nitrite: NEGATIVE
PH: 6 (ref 5.0–8.0)
Protein, ur: NEGATIVE mg/dL
SQUAMOUS EPITHELIAL / LPF: NONE SEEN
Specific Gravity, Urine: 1.013 (ref 1.005–1.030)

## 2017-09-28 LAB — CBC WITH DIFFERENTIAL (CANCER CENTER ONLY)
Basophils Absolute: 0 10*3/uL (ref 0.0–0.1)
Basophils Relative: 1 %
EOS ABS: 0.1 10*3/uL (ref 0.0–0.5)
Eosinophils Relative: 2 %
HCT: 32.6 % — ABNORMAL LOW (ref 38.4–49.9)
HEMOGLOBIN: 11.3 g/dL — AB (ref 13.0–17.1)
LYMPHS ABS: 0.6 10*3/uL — AB (ref 0.9–3.3)
Lymphocytes Relative: 12 %
MCH: 28.6 pg (ref 27.2–33.4)
MCHC: 34.7 g/dL (ref 32.0–36.0)
MCV: 82.4 fL (ref 79.3–98.0)
MONO ABS: 0.4 10*3/uL (ref 0.1–0.9)
MONOS PCT: 8 %
Neutro Abs: 3.9 10*3/uL (ref 1.5–6.5)
Neutrophils Relative %: 77 %
Platelet Count: 135 10*3/uL — ABNORMAL LOW (ref 140–400)
RBC: 3.96 MIL/uL — ABNORMAL LOW (ref 4.20–5.82)
RDW: 14.5 % (ref 11.0–14.6)
WBC Count: 5 10*3/uL (ref 4.0–10.3)

## 2017-09-28 LAB — CMP (CANCER CENTER ONLY)
ALBUMIN: 3 g/dL — AB (ref 3.5–5.0)
ALK PHOS: 200 U/L — AB (ref 40–150)
ALT: 103 U/L — AB (ref 0–55)
AST: 78 U/L — AB (ref 5–34)
Anion gap: 8 (ref 3–11)
BUN: 9 mg/dL (ref 7–26)
CHLORIDE: 105 mmol/L (ref 98–109)
CO2: 27 mmol/L (ref 22–29)
CREATININE: 0.94 mg/dL (ref 0.70–1.30)
Calcium: 8.7 mg/dL (ref 8.4–10.4)
GFR, Estimated: >60 mL/min (ref >60)
GLUCOSE: 144 mg/dL — AB (ref 70–140)
Potassium: 3.2 mmol/L — ABNORMAL LOW (ref 3.5–5.1)
SODIUM: 140 mmol/L (ref 136–145)
Total Bilirubin: 0.7 mg/dL (ref 0.2–1.2)
Total Protein: 5.5 g/dL — ABNORMAL LOW (ref 6.4–8.3)

## 2017-09-28 MED ORDER — SODIUM CHLORIDE 0.9 % IV SOLN
Freq: Once | INTRAVENOUS | Status: AC
  Administered 2017-09-28: 12:00:00 via INTRAVENOUS
  Filled 2017-09-28: qty 5

## 2017-09-28 MED ORDER — PALONOSETRON HCL INJECTION 0.25 MG/5ML
0.2500 mg | Freq: Once | INTRAVENOUS | Status: AC
  Administered 2017-09-28: 0.25 mg via INTRAVENOUS

## 2017-09-28 MED ORDER — SODIUM CHLORIDE 0.9 % IV SOLN
150.0000 mg/m2 | Freq: Once | INTRAVENOUS | Status: AC
  Administered 2017-09-28: 360 mg via INTRAVENOUS
  Filled 2017-09-28: qty 18

## 2017-09-28 MED ORDER — TRAMADOL HCL 50 MG PO TABS: 50.0000 mg | ORAL_TABLET | Freq: Three times a day (TID) | ORAL | 0 refills | Status: DC | PRN

## 2017-09-28 MED ORDER — ATROPINE SULFATE 1 MG/ML IJ SOLN
0.5000 mg | Freq: Once | INTRAMUSCULAR | Status: AC | PRN
  Administered 2017-09-28: 0.5 mg via INTRAVENOUS

## 2017-09-28 MED ORDER — SODIUM CHLORIDE 0.9 % IV SOLN
400.0000 mg/m2 | Freq: Once | INTRAVENOUS | Status: AC
  Administered 2017-09-28: 952 mg via INTRAVENOUS
  Filled 2017-09-28: qty 47.6

## 2017-09-28 MED ORDER — TAMSULOSIN HCL 0.4 MG PO CAPS: 0.4000 mg | ORAL_CAPSULE | Freq: Every day | ORAL | 0 refills | Status: DC

## 2017-09-28 MED ORDER — DEXTROSE 5 % IV SOLN
Freq: Once | INTRAVENOUS | Status: AC
  Administered 2017-09-28: 12:00:00 via INTRAVENOUS

## 2017-09-28 MED ORDER — SODIUM CHLORIDE 0.9 % IV SOLN
2400.0000 mg/m2 | INTRAVENOUS | Status: DC
  Administered 2017-09-28: 5700 mg via INTRAVENOUS
  Filled 2017-09-28: qty 114

## 2017-09-28 MED ORDER — PALONOSETRON HCL INJECTION 0.25 MG/5ML
INTRAVENOUS | Status: AC
  Filled 2017-09-28: qty 5

## 2017-09-28 MED ORDER — ATROPINE SULFATE 1 MG/ML IJ SOLN
INTRAMUSCULAR | Status: AC
  Filled 2017-09-28: qty 1

## 2017-09-28 MED ORDER — OXALIPLATIN CHEMO INJECTION 100 MG/20ML
85.0000 mg/m2 | Freq: Once | INTRAVENOUS | Status: AC
  Administered 2017-09-28: 200 mg via INTRAVENOUS
  Filled 2017-09-28: qty 40

## 2017-09-28 NOTE — Patient Instructions (Signed)
Implanted Port Home Guide An implanted port is a type of central line that is placed under the skin. Central lines are used to provide IV access when treatment or nutrition needs to be given through a person's veins. Implanted ports are used for long-term IV access. An implanted port may be placed because:  You need IV medicine that would be irritating to the small veins in your hands or arms.  You need long-term IV medicines, such as antibiotics.  You need IV nutrition for a long period.  You need frequent blood draws for lab tests.  You need dialysis.  Implanted ports are usually placed in the chest area, but they can also be placed in the upper arm, the abdomen, or the leg. An implanted port has two main parts:  Reservoir. The reservoir is round and will appear as a small, raised area under your skin. The reservoir is the part where a needle is inserted to give medicines or draw blood.  Catheter. The catheter is a thin, flexible tube that extends from the reservoir. The catheter is placed into a large vein. Medicine that is inserted into the reservoir goes into the catheter and then into the vein.  How will I care for my incision site? Do not get the incision site wet. Bathe or shower as directed by your health care provider. How is my port accessed? Special steps must be taken to access the port:  Before the port is accessed, a numbing cream can be placed on the skin. This helps numb the skin over the port site.  Your health care provider uses a sterile technique to access the port. ? Your health care provider must put on a mask and sterile gloves. ? The skin over your port is cleaned carefully with an antiseptic and allowed to dry. ? The port is gently pinched between sterile gloves, and a needle is inserted into the port.  Only "non-coring" port needles should be used to access the port. Once the port is accessed, a blood return should be checked. This helps ensure that the port  is in the vein and is not clogged.  If your port needs to remain accessed for a constant infusion, a clear (transparent) bandage will be placed over the needle site. The bandage and needle will need to be changed every week, or as directed by your health care provider.  Keep the bandage covering the needle clean and dry. Do not get it wet. Follow your health care provider's instructions on how to take a shower or bath while the port is accessed.  If your port does not need to stay accessed, no bandage is needed over the port.  What is flushing? Flushing helps keep the port from getting clogged. Follow your health care provider's instructions on how and when to flush the port. Ports are usually flushed with saline solution or a medicine called heparin. The need for flushing will depend on how the port is used.  If the port is used for intermittent medicines or blood draws, the port will need to be flushed: ? After medicines have been given. ? After blood has been drawn. ? As part of routine maintenance.  If a constant infusion is running, the port may not need to be flushed.  How long will my port stay implanted? The port can stay in for as long as your health care provider thinks it is needed. When it is time for the port to come out, surgery will be   done to remove it. The procedure is similar to the one performed when the port was put in. When should I seek immediate medical care? When you have an implanted port, you should seek immediate medical care if:  You notice a bad smell coming from the incision site.  You have swelling, redness, or drainage at the incision site.  You have more swelling or pain at the port site or the surrounding area.  You have a fever that is not controlled with medicine.  This information is not intended to replace advice given to you by your health care provider. Make sure you discuss any questions you have with your health care provider. Document  Released: 06/20/2005 Document Revised: 11/26/2015 Document Reviewed: 02/25/2013 Elsevier Interactive Patient Education  2017 Elsevier Inc.  

## 2017-09-28 NOTE — Telephone Encounter (Signed)
Scheduled appt per 3/28 los- patient to get an updated schedule in the tx area.

## 2017-09-28 NOTE — Progress Notes (Signed)
**Cory [name]** Cory Cory Kelly   Diagnosis: Pancreas cancer  INTERVAL HISTORY:   Cory Cory Kelly returns as scheduled.  Completed cycle 2 FOLFIRINOX 09/14/2017.  He reports increased malaise following the cycle of chemotherapy.  This has improved.  He had cold sensitivity for 5 days.  He has occasional tingling in the feet.  He reports difficulty urinating when he is sitting down.  He has nocturia.  He is also noted burning with urination.  He has intermittent episodes of mid upper abdominal pain that last for 1-2 minutes.  The abdominal pain he had prior to beginning treatment has improved.  Objective:  Vital signs in last 24 hours:  There were no vitals taken for this visit.    HEENT: No thrush or ulcers Resp: Lungs with bronchial sounds at the right upper posterior chest, no respiratory distress Cardio: Regular rate and rhythm GI: Nontender, no mass, no hepatomegaly Vascular: No leg edema    Portacath/PICC-without erythema  Lab Results:  Lab Results  Component Value Date   WBC 5.0 09/28/2017   HGB 13.0 08/30/2017   HCT 32.6 (L) 09/28/2017   MCV 82.4 09/28/2017   PLT 135 (L) 09/28/2017   NEUTROABS 3.9 09/28/2017    CMP     Component Value Date/Time   NA 140 09/14/2017 1024   K 3.3 (L) 09/14/2017 1024   CL 104 09/14/2017 1024   CO2 28 09/14/2017 1024   GLUCOSE 149 (H) 09/14/2017 1024   BUN 7 09/14/2017 1024   CREATININE 0.99 09/14/2017 1024   CREATININE 1.27 10/27/2015 1600   CALCIUM 8.8 09/14/2017 1024   PROT 5.7 (L) 09/14/2017 1024   ALBUMIN 3.0 (L) 09/14/2017 1024   AST 56 (H) 09/14/2017 1024   ALT 117 (H) 09/14/2017 1024   ALKPHOS 292 (H) 09/14/2017 1024   BILITOT 0.8 09/14/2017 1024   GFRNONAA >60 09/14/2017 1024   GFRNONAA 62 10/27/2015 1600   GFRAA >60 09/14/2017 1024   GFRAA 72 10/27/2015 1600     Medications: I have reviewed the patient's current medications.   Assessment/Plan: 1. Pancreas head mass  CT abdomen/pelvis  08/03/2017-fullness in the head of the pancreas with stranding in the peripancreatic fat and pancreatic ductal dilatation.   MRI abdomen 08/04/2017-findings favored to represent acute on chronic pancreatitis; equivocal soft tissue fullness within the head and uncinate process of the pancreas; indeterminate too small to characterize right hepatic lobe lesion; small volume abdominal ascites; splenomegaly.   CA-19-9 elevated at 977on 08/04/2017.  Status postupper EUS 08/10/2017-findings of an irregular masslike region in the pancreatic head measuring approximately 2.7 cm. This directly abutted the main portal vein but no other major vascular structures. Biopsies obtained with preliminary cytology positive for malignancy, likely adenocarcinoma. The final report is pending. The common bile duct and main pancreatic duct were both dilated. ERCP was then proceeded with forstent placement. Multiple attempts were made tocannulate the bile duct without success. The procedure was aborted.  08/16/2017 ERCP with placement of a metal biliary stent in the common bile duct by Dr. Francella Solian at [facility].  Cycle 1 FOLFIRINOX 08/31/2017  Cycle 2 FOLFIRINOX 09/14/2017  Cycle 3 FOLFIRINOX 09/28/2017 2. Hypertension 3. Port-A-Cath placement 08/30/2017 4. History of elevated bilirubin-questionGilbert's syndrome 5. Mild leukopenia, thrombocytopenia-question secondary to portal hypertension/splenomegaly; 09/14/2017 white count improved, platelet count stable   Disposition: Cory Cory Kelly has completed 2 cycles of FOLFIRINOX.  He has tolerated the chemotherapy well to date.  He will complete cycle 3 today.  He will begin a trial  of Flomax for the urinary symptoms.  We will check a urinalysis and culture.  We will follow-up on the CA 19-9 from today.  The plan is to complete 5 cycles of FOLFIRINOX prior to a restaging evaluation.  Cory Cory Kelly will return for an office visit and chemotherapy in 2 weeks.  25 minutes were spent  with the patient today.  The majority of the time was used for counseling and coordination of care.  Betsy Coder, MD  09/28/2017  10:19 AM

## 2017-09-28 NOTE — Progress Notes (Signed)
Ok to treat with ALT and AST per Dr. Benay Spice.

## 2017-09-29 ENCOUNTER — Telehealth: Payer: Self-pay

## 2017-09-29 ENCOUNTER — Encounter: Payer: Self-pay | Admitting: Nurse Practitioner

## 2017-09-29 LAB — URINE CULTURE: CULTURE: NO GROWTH

## 2017-09-29 LAB — CANCER ANTIGEN 19-9: CAN 19-9: 508 U/mL — AB (ref 0–35)

## 2017-09-29 NOTE — Telephone Encounter (Addendum)
Pt voiced understanding of message below. Reports "Ive actually stopped hurting and im not going as much". This RN vioced understanding.    ----- Message from Ladell Pier, MD sent at 09/29/2017  3:10 PM EDT ----- Please call patient, there is microscopic blood in the urine, could indicate an infection or stone We are waiting on a culture result, call for a fever or increased urinary symptoms

## 2017-09-30 ENCOUNTER — Inpatient Hospital Stay: Payer: BLUE CROSS/BLUE SHIELD

## 2017-09-30 VITALS — BP 144/89 | HR 55 | Temp 98.9°F | Resp 17

## 2017-09-30 DIAGNOSIS — Z5111 Encounter for antineoplastic chemotherapy: Secondary | ICD-10-CM | POA: Diagnosis not present

## 2017-09-30 DIAGNOSIS — C250 Malignant neoplasm of head of pancreas: Secondary | ICD-10-CM

## 2017-09-30 DIAGNOSIS — Z5189 Encounter for other specified aftercare: Secondary | ICD-10-CM | POA: Diagnosis not present

## 2017-09-30 MED ORDER — HEPARIN SOD (PORK) LOCK FLUSH 100 UNIT/ML IV SOLN
500.0000 [IU] | Freq: Once | INTRAVENOUS | Status: AC | PRN
  Administered 2017-09-30: 500 [IU]
  Filled 2017-09-30: qty 5

## 2017-09-30 MED ORDER — PEGFILGRASTIM-CBQV 6 MG/0.6ML ~~LOC~~ SOSY
6.0000 mg | PREFILLED_SYRINGE | Freq: Once | SUBCUTANEOUS | Status: AC
  Administered 2017-09-30: 6 mg via SUBCUTANEOUS

## 2017-09-30 MED ORDER — PEGFILGRASTIM-CBQV 6 MG/0.6ML ~~LOC~~ SOSY
PREFILLED_SYRINGE | SUBCUTANEOUS | Status: AC
  Filled 2017-09-30: qty 0.6

## 2017-09-30 MED ORDER — SODIUM CHLORIDE 0.9% FLUSH
10.0000 mL | INTRAVENOUS | Status: DC | PRN
  Administered 2017-09-30: 10 mL
  Filled 2017-09-30: qty 10

## 2017-10-04 ENCOUNTER — Encounter: Payer: Self-pay | Admitting: Nurse Practitioner

## 2017-10-04 NOTE — Telephone Encounter (Signed)
Called pt, informed him that we can not prescribe cyclobenzaprine for the symptoms he is describing. Pt stated he has intermittent "sharp gas pains" and bloating that are relieved after he's able to belch several times. This comes and goes sporadically.  Informed pt I will review with provider and call with any recommendations. He voiced understanding.

## 2017-10-05 MED ORDER — CYCLOBENZAPRINE HCL 5 MG PO TABS: 5.0000 mg | ORAL_TABLET | Freq: Two times a day (BID) | ORAL | 0 refills | Status: DC | PRN

## 2017-10-05 NOTE — Addendum Note (Signed)
Addended by: Rosalio Macadamia C on: 10/05/2017 10:09 AM   Modules accepted: Orders

## 2017-10-05 NOTE — Telephone Encounter (Signed)
Called pt, he reports checking his temp last night because he felt hot. 100.1 was the highest reading. He denies any urinary or respiratory symptoms. Pt reports port feels/looks OK. Informed him the tramadol will not reduce temp, OK to take Tylenol or Ibuprofen if needed. He will inform wife. Instructed pt to call for temp greater than 100.5 or any other signs of infection. He voiced understanding.

## 2017-10-05 NOTE — Telephone Encounter (Signed)
Per Dr. Benay Spice: OK to continue Flexeril if taking for pain.

## 2017-10-08 ENCOUNTER — Other Ambulatory Visit: Payer: Self-pay | Admitting: Oncology

## 2017-10-09 ENCOUNTER — Encounter: Payer: Self-pay | Admitting: Oncology

## 2017-10-11 ENCOUNTER — Encounter: Payer: Self-pay | Admitting: Internal Medicine

## 2017-10-12 ENCOUNTER — Telehealth: Payer: Self-pay | Admitting: Oncology

## 2017-10-12 ENCOUNTER — Inpatient Hospital Stay: Payer: BLUE CROSS/BLUE SHIELD | Attending: Oncology

## 2017-10-12 ENCOUNTER — Inpatient Hospital Stay: Payer: BLUE CROSS/BLUE SHIELD

## 2017-10-12 ENCOUNTER — Inpatient Hospital Stay (HOSPITAL_BASED_OUTPATIENT_CLINIC_OR_DEPARTMENT_OTHER): Payer: BLUE CROSS/BLUE SHIELD | Admitting: Oncology

## 2017-10-12 ENCOUNTER — Inpatient Hospital Stay: Payer: BLUE CROSS/BLUE SHIELD | Admitting: Nutrition

## 2017-10-12 VITALS — BP 147/80 | HR 60 | Temp 97.7°F | Resp 18 | Wt 214.4 lb

## 2017-10-12 DIAGNOSIS — Z5189 Encounter for other specified aftercare: Secondary | ICD-10-CM | POA: Insufficient documentation

## 2017-10-12 DIAGNOSIS — C250 Malignant neoplasm of head of pancreas: Secondary | ICD-10-CM | POA: Insufficient documentation

## 2017-10-12 DIAGNOSIS — I10 Essential (primary) hypertension: Secondary | ICD-10-CM

## 2017-10-12 DIAGNOSIS — Z95828 Presence of other vascular implants and grafts: Secondary | ICD-10-CM

## 2017-10-12 DIAGNOSIS — D6959 Other secondary thrombocytopenia: Secondary | ICD-10-CM | POA: Diagnosis not present

## 2017-10-12 DIAGNOSIS — Z5111 Encounter for antineoplastic chemotherapy: Secondary | ICD-10-CM | POA: Insufficient documentation

## 2017-10-12 LAB — CBC WITH DIFFERENTIAL (CANCER CENTER ONLY)
BASOS PCT: 0 %
Basophils Absolute: 0 10*3/uL (ref 0.0–0.1)
EOS ABS: 0.1 10*3/uL (ref 0.0–0.5)
Eosinophils Relative: 2 %
HCT: 32.8 % — ABNORMAL LOW (ref 38.4–49.9)
HEMOGLOBIN: 11.3 g/dL — AB (ref 13.0–17.1)
LYMPHS ABS: 0.7 10*3/uL — AB (ref 0.9–3.3)
Lymphocytes Relative: 11 %
MCH: 28.8 pg (ref 27.2–33.4)
MCHC: 34.5 g/dL (ref 32.0–36.0)
MCV: 83.5 fL (ref 79.3–98.0)
MONO ABS: 0.5 10*3/uL (ref 0.1–0.9)
MONOS PCT: 8 %
Neutro Abs: 5 10*3/uL (ref 1.5–6.5)
Neutrophils Relative %: 79 %
Platelet Count: 79 10*3/uL — ABNORMAL LOW (ref 140–400)
RBC: 3.93 MIL/uL — ABNORMAL LOW (ref 4.20–5.82)
RDW: 15.8 % — AB (ref 11.0–14.6)
WBC Count: 6.3 10*3/uL (ref 4.0–10.3)

## 2017-10-12 LAB — CMP (CANCER CENTER ONLY)
ALBUMIN: 3.1 g/dL — AB (ref 3.5–5.0)
ALK PHOS: 203 U/L — AB (ref 40–150)
ALT: 99 U/L — ABNORMAL HIGH (ref 0–55)
ANION GAP: 9 (ref 3–11)
AST: 44 U/L — ABNORMAL HIGH (ref 5–34)
BILIRUBIN TOTAL: 0.7 mg/dL (ref 0.2–1.2)
BUN: 9 mg/dL (ref 7–26)
CALCIUM: 8.7 mg/dL (ref 8.4–10.4)
CO2: 27 mmol/L (ref 22–29)
Chloride: 105 mmol/L (ref 98–109)
Creatinine: 0.88 mg/dL (ref 0.70–1.30)
GFR, Estimated: >60 mL/min (ref >60)
GLUCOSE: 149 mg/dL — AB (ref 70–140)
POTASSIUM: 3 mmol/L — AB (ref 3.5–5.1)
SODIUM: 141 mmol/L (ref 136–145)
TOTAL PROTEIN: 5.6 g/dL — AB (ref 6.4–8.3)

## 2017-10-12 MED ORDER — LEUCOVORIN CALCIUM INJECTION 350 MG
400.0000 mg/m2 | Freq: Once | INTRAMUSCULAR | Status: AC
  Administered 2017-10-12: 888 mg via INTRAVENOUS
  Filled 2017-10-12: qty 44.4

## 2017-10-12 MED ORDER — SODIUM CHLORIDE 0.9 % IV SOLN
150.0000 mg/m2 | Freq: Once | INTRAVENOUS | Status: AC
  Administered 2017-10-12: 340 mg via INTRAVENOUS
  Filled 2017-10-12: qty 17

## 2017-10-12 MED ORDER — SODIUM CHLORIDE 0.9% FLUSH
10.0000 mL | Freq: Once | INTRAVENOUS | Status: AC
  Administered 2017-10-12: 10 mL
  Filled 2017-10-12: qty 10

## 2017-10-12 MED ORDER — ATROPINE SULFATE 1 MG/ML IJ SOLN
0.5000 mg | Freq: Once | INTRAMUSCULAR | Status: AC | PRN
  Administered 2017-10-12: 0.5 mg via INTRAVENOUS

## 2017-10-12 MED ORDER — ATROPINE SULFATE 1 MG/ML IJ SOLN
INTRAMUSCULAR | Status: AC
  Filled 2017-10-12: qty 1

## 2017-10-12 MED ORDER — SODIUM CHLORIDE 0.9% FLUSH
10.0000 mL | INTRAVENOUS | Status: DC | PRN
  Filled 2017-10-12: qty 10

## 2017-10-12 MED ORDER — HEPARIN SOD (PORK) LOCK FLUSH 100 UNIT/ML IV SOLN
500.0000 [IU] | Freq: Once | INTRAVENOUS | Status: DC | PRN
  Filled 2017-10-12: qty 5

## 2017-10-12 MED ORDER — SODIUM CHLORIDE 0.9 % IV SOLN
Freq: Once | INTRAVENOUS | Status: AC
  Administered 2017-10-12: 12:00:00 via INTRAVENOUS
  Filled 2017-10-12: qty 5

## 2017-10-12 MED ORDER — PALONOSETRON HCL INJECTION 0.25 MG/5ML
INTRAVENOUS | Status: AC
  Filled 2017-10-12: qty 5

## 2017-10-12 MED ORDER — SODIUM CHLORIDE 0.9 % IV SOLN
2400.0000 mg/m2 | INTRAVENOUS | Status: AC
  Administered 2017-10-12: 5350 mg via INTRAVENOUS
  Filled 2017-10-12: qty 107

## 2017-10-12 MED ORDER — POTASSIUM CHLORIDE ER 10 MEQ PO CPCR: 10.0000 meq | ORAL_CAPSULE | Freq: Two times a day (BID) | ORAL | 0 refills | Status: DC

## 2017-10-12 MED ORDER — DEXTROSE 5 % IV SOLN
65.0000 mg/m2 | Freq: Once | INTRAVENOUS | Status: AC
  Administered 2017-10-12: 145 mg via INTRAVENOUS
  Filled 2017-10-12: qty 9

## 2017-10-12 MED ORDER — PALONOSETRON HCL INJECTION 0.25 MG/5ML
0.2500 mg | Freq: Once | INTRAVENOUS | Status: AC
  Administered 2017-10-12: 0.25 mg via INTRAVENOUS

## 2017-10-12 MED ORDER — DEXTROSE 5 % IV SOLN
Freq: Once | INTRAVENOUS | Status: AC
  Administered 2017-10-12: 11:00:00 via INTRAVENOUS

## 2017-10-12 NOTE — Patient Instructions (Signed)
[facility] Discharge Instructions for Patients Receiving Chemotherapy  Today you received the following chemotherapy agents Oxaliplatin, Leucovorin, Camptosar and Adrucil.   To help prevent nausea and vomiting after your treatment, we encourage you to take your nausea medication as prescribed.   If you develop nausea and vomiting that is not controlled by your nausea medication, call the clinic.   BELOW ARE SYMPTOMS THAT SHOULD BE REPORTED IMMEDIATELY:  *FEVER GREATER THAN 100.5 F  *CHILLS WITH OR WITHOUT FEVER  NAUSEA AND VOMITING THAT IS NOT CONTROLLED WITH YOUR NAUSEA MEDICATION  *UNUSUAL SHORTNESS OF BREATH  *UNUSUAL BRUISING OR BLEEDING  TENDERNESS IN MOUTH AND THROAT WITH OR WITHOUT PRESENCE OF ULCERS  *URINARY PROBLEMS  *BOWEL PROBLEMS  UNUSUAL RASH Items with * indicate a potential emergency and should be followed up as soon as possible.  Feel free to call the clinic should you have any questions or concerns. The clinic phone number is [phone].  Please show the [place] at check-in to the Emergency Department and triage nurse.

## 2017-10-12 NOTE — Addendum Note (Signed)
Addended by: Mathis Fare on: 10/12/2017 11:04 AM   Modules accepted: Orders

## 2017-10-12 NOTE — Progress Notes (Signed)
[place] OFFICE PROGRESS NOTE   Diagnosis: Pancreas cancer  INTERVAL HISTORY:   [name] completed cycle 3 of FOLFIRINOX 09/28/2017.  He had less fatigue following this cycle of chemotherapy.  He is working.  No mouth sores or diarrhea.  He reports increased belching and flatulence.  He has intermittent abdominal bloating.  No consistent pain.  Cold sensitivity and tingling in the hands lasted for 10 days following chemotherapy.  No other neuropathy symptoms.  Objective:  Vital signs in last 24 hours:  Blood pressure (!) 147/80, pulse 60, temperature 97.7 F (36.5 C), temperature source Oral, resp. rate 18, weight 214 lb 6.4 oz (97.3 kg), SpO2 100 %.    HEENT: No thrush or ulcers Resp: Lungs clear bilaterally Cardio: Regular rate and rhythm GI: No hepatomegaly, no mass, nontender Vascular: No leg edema Neuro: The vibratory sense is intact at the fingertips bilaterally  Portacath/PICC-without erythema  Lab Results:  Lab Results  Component Value Date   WBC 6.3 10/12/2017   HGB 13.0 08/30/2017   HCT 32.8 (L) 10/12/2017   MCV 83.5 10/12/2017   PLT 79 (L) 10/12/2017   NEUTROABS 5.0 10/12/2017    CMP     Component Value Date/Time   NA 141 10/12/2017 0915   K 3.0 (LL) 10/12/2017 0915   CL 105 10/12/2017 0915   CO2 27 10/12/2017 0915   GLUCOSE 149 (H) 10/12/2017 0915   BUN 9 10/12/2017 0915   CREATININE 0.88 10/12/2017 0915   CREATININE 1.27 10/27/2015 1600   CALCIUM 8.7 10/12/2017 0915   PROT 5.6 (L) 10/12/2017 0915   ALBUMIN 3.1 (L) 10/12/2017 0915   AST 44 (H) 10/12/2017 0915   ALT 99 (H) 10/12/2017 0915   ALKPHOS 203 (H) 10/12/2017 0915   BILITOT 0.7 10/12/2017 0915   GFRNONAA >60 10/12/2017 0915   GFRNONAA 62 10/27/2015 1600   GFRAA >60 10/12/2017 0915   GFRAA 72 10/27/2015 1600   09/28/2017: CA 19-9-  508 Medications: I have reviewed the patient's current medications.   Assessment/Plan: 1. Pancreas head mass  CT abdomen/pelvis  08/03/2017-fullness in the head of the pancreas with stranding in the peripancreatic fat and pancreatic ductal dilatation.   MRI abdomen 08/04/2017-findings favored to represent acute on chronic pancreatitis; equivocal soft tissue fullness within the head and uncinate process of the pancreas; indeterminate too small to characterize right hepatic lobe lesion; small volume abdominal ascites; splenomegaly.   CA-19-9 elevated at 977on 08/04/2017.  Status postupper EUS 08/10/2017-findings of an irregular masslike region in the pancreatic head measuring approximately 2.7 cm. This directly abutted the main portal vein but no other major vascular structures. Biopsies obtained with preliminary cytology positive for malignancy, likely adenocarcinoma. The final report is pending. The common bile duct and main pancreatic duct were both dilated. ERCP was then proceeded with forstent placement. Multiple attempts were made tocannulate the bile duct without success. The procedure was aborted.  08/16/2017 ERCP with placement of a metal biliary stent in the common bile duct by Dr. Francella Solian at [facility].  Cycle 1 FOLFIRINOX 08/31/2017  Cycle 2 FOLFIRINOX 09/14/2017  Cycle 3 FOLFIRINOX 09/28/2017  Cycle 4 FOLFIRINOX 10/12/2017 (oxaliplatin dose reduced secondary to thrombocytopenia) 2. Hypertension 3. Port-A-Cath placement 08/30/2017 4. History of elevated bilirubin-questionGilbert's syndrome 5. Mild leukopenia, thrombocytopenia-question secondary to portal hypertension/splenomegaly;09/14/2017 white count improved, platelet count stable    Disposition: [name] has completed 3 cycles of FOLFIRINOX.  He has tolerated the FOLFIRINOX well.  The oxaliplatin will be dose reduced with cycle 4  secondary to thrombocytopenia.  [name] will begin a potassium supplement.  We will check the magnesium level today. He will return for an office visit and cycle 5 chemotherapy in 2 weeks.  He will contact us for  bleeding/bruising following this cycle of chemotherapy. We will contact Dr. Donnal Moat to plan a restaging evaluation after cycle 5.  25 minutes were spent with the patient today.  The majority of the time was used for counseling and coordination of care.  Betsy Coder, MD  10/12/2017  10:53 AM

## 2017-10-12 NOTE — Progress Notes (Signed)
62 year old male diagnosed with pancreas cancer.   He is a patient of Dr. Benay Spice.  Past medical history includes hypertension, fatigue, and hard of hearing.  Medications include Compazine.  Labs include potassium 3.2, glucose 144, and albumin 3.0 on March 28.  Height: 6 feet 0 inches. Weight: 214.4 pounds. Usual body weight: 243 pounds January 2019. BMI: 29.08.  Patient reports he is eating well and does not understand why he has lost weight. Patient denies nutrition impact symptoms. He does report increased gas and states he will speak with his physician about this. Wife endorses 50+ pound weight loss from usual body weight.  Nutrition diagnosis: Inadequate oral intake related to new diagnosis of cancer and associated treatments as evidenced by 12% weight loss over 3 months.  Intervention: Educated patient to consume smaller more frequent meals and snacks utilizing high calorie high protein foods. Reviewed specific snacks and encourage patient to take snacks with him to work. Educated patient on ways to add more calories to food. Provided fact sheets.  Questions were answered.  Teach back method used.  Contact information provided.  Monitoring evaluation goals: Patient will tolerate increased calories and protein to minimize further weight loss.  Next visit: Thursday, April 25 during infusion.  **Disclaimer: This note was dictated with voice recognition software. Similar sounding words can inadvertently be transcribed and this note may contain transcription errors which may not have been corrected upon publication of note.**

## 2017-10-12 NOTE — Telephone Encounter (Signed)
Scheduled appt per 4/11 los - patient to get an updated schedule in the treatment area.  

## 2017-10-14 ENCOUNTER — Inpatient Hospital Stay: Payer: BLUE CROSS/BLUE SHIELD

## 2017-10-14 VITALS — BP 154/85 | HR 61 | Temp 97.5°F | Resp 18

## 2017-10-14 DIAGNOSIS — C250 Malignant neoplasm of head of pancreas: Secondary | ICD-10-CM

## 2017-10-14 DIAGNOSIS — Z5189 Encounter for other specified aftercare: Secondary | ICD-10-CM | POA: Diagnosis not present

## 2017-10-14 DIAGNOSIS — Z5111 Encounter for antineoplastic chemotherapy: Secondary | ICD-10-CM | POA: Diagnosis not present

## 2017-10-14 DIAGNOSIS — Z95828 Presence of other vascular implants and grafts: Secondary | ICD-10-CM

## 2017-10-14 MED ORDER — HEPARIN SOD (PORK) LOCK FLUSH 100 UNIT/ML IV SOLN
500.0000 [IU] | Freq: Once | INTRAVENOUS | Status: AC
  Administered 2017-10-14: 500 [IU]
  Filled 2017-10-14: qty 5

## 2017-10-14 MED ORDER — SODIUM CHLORIDE 0.9% FLUSH
10.0000 mL | Freq: Once | INTRAVENOUS | Status: AC
  Administered 2017-10-14: 10 mL
  Filled 2017-10-14: qty 10

## 2017-10-14 MED ORDER — PEGFILGRASTIM-CBQV 6 MG/0.6ML ~~LOC~~ SOSY
6.0000 mg | PREFILLED_SYRINGE | Freq: Once | SUBCUTANEOUS | Status: AC
  Administered 2017-10-14: 6 mg via SUBCUTANEOUS

## 2017-10-14 MED ORDER — PEGFILGRASTIM-CBQV 6 MG/0.6ML ~~LOC~~ SOSY
PREFILLED_SYRINGE | SUBCUTANEOUS | Status: AC
  Filled 2017-10-14: qty 0.6

## 2017-10-22 ENCOUNTER — Other Ambulatory Visit: Payer: Self-pay | Admitting: Oncology

## 2017-10-24 ENCOUNTER — Other Ambulatory Visit: Payer: Self-pay

## 2017-10-24 DIAGNOSIS — C250 Malignant neoplasm of head of pancreas: Secondary | ICD-10-CM

## 2017-10-24 MED ORDER — TAMSULOSIN HCL 0.4 MG PO CAPS: 0.4000 mg | ORAL_CAPSULE | Freq: Every day | ORAL | 0 refills | Status: DC

## 2017-10-24 NOTE — Telephone Encounter (Signed)
Error opening  

## 2017-10-26 ENCOUNTER — Inpatient Hospital Stay (HOSPITAL_BASED_OUTPATIENT_CLINIC_OR_DEPARTMENT_OTHER): Payer: BLUE CROSS/BLUE SHIELD | Admitting: Oncology

## 2017-10-26 ENCOUNTER — Inpatient Hospital Stay: Payer: BLUE CROSS/BLUE SHIELD | Admitting: Nutrition

## 2017-10-26 ENCOUNTER — Inpatient Hospital Stay: Payer: BLUE CROSS/BLUE SHIELD

## 2017-10-26 VITALS — BP 143/78 | HR 61 | Temp 98.2°F | Resp 18 | Ht 72.0 in | Wt 209.5 lb

## 2017-10-26 DIAGNOSIS — C250 Malignant neoplasm of head of pancreas: Secondary | ICD-10-CM | POA: Diagnosis not present

## 2017-10-26 DIAGNOSIS — Z5189 Encounter for other specified aftercare: Secondary | ICD-10-CM | POA: Diagnosis not present

## 2017-10-26 DIAGNOSIS — R197 Diarrhea, unspecified: Secondary | ICD-10-CM | POA: Diagnosis not present

## 2017-10-26 DIAGNOSIS — I10 Essential (primary) hypertension: Secondary | ICD-10-CM

## 2017-10-26 DIAGNOSIS — Z5111 Encounter for antineoplastic chemotherapy: Secondary | ICD-10-CM | POA: Diagnosis not present

## 2017-10-26 LAB — CMP (CANCER CENTER ONLY)
ALBUMIN: 3.4 g/dL — AB (ref 3.5–5.0)
ALT: 75 U/L — AB (ref 0–55)
AST: 35 U/L — ABNORMAL HIGH (ref 5–34)
Alkaline Phosphatase: 211 U/L — ABNORMAL HIGH (ref 40–150)
Anion gap: 9 (ref 3–11)
BUN: 5 mg/dL — AB (ref 7–26)
CHLORIDE: 106 mmol/L (ref 98–109)
CO2: 27 mmol/L (ref 22–29)
CREATININE: 0.83 mg/dL (ref 0.70–1.30)
Calcium: 9.1 mg/dL (ref 8.4–10.4)
GFR, Estimated: >60 mL/min (ref >60)
Glucose, Bld: 124 mg/dL (ref 70–140)
Potassium: 3.4 mmol/L — ABNORMAL LOW (ref 3.5–5.1)
Sodium: 142 mmol/L (ref 136–145)
Total Bilirubin: 0.9 mg/dL (ref 0.2–1.2)
Total Protein: 6 g/dL — ABNORMAL LOW (ref 6.4–8.3)

## 2017-10-26 LAB — CBC WITH DIFFERENTIAL (CANCER CENTER ONLY)
Basophils Absolute: 0 10*3/uL (ref 0.0–0.1)
Basophils Relative: 0 %
EOS ABS: 0 10*3/uL (ref 0.0–0.5)
Eosinophils Relative: 1 %
HCT: 35.1 % — ABNORMAL LOW (ref 38.4–49.9)
HEMOGLOBIN: 12 g/dL — AB (ref 13.0–17.1)
Lymphocytes Relative: 13 %
Lymphs Abs: 1.1 10*3/uL (ref 0.9–3.3)
MCH: 29.1 pg (ref 27.2–33.4)
MCHC: 34.2 g/dL (ref 32.0–36.0)
MCV: 85 fL (ref 79.3–98.0)
Monocytes Absolute: 0.6 10*3/uL (ref 0.1–0.9)
Monocytes Relative: 7 %
NEUTROS PCT: 79 %
Neutro Abs: 7 10*3/uL — ABNORMAL HIGH (ref 1.5–6.5)
Platelet Count: 110 10*3/uL — ABNORMAL LOW (ref 140–400)
RBC: 4.13 MIL/uL — ABNORMAL LOW (ref 4.20–5.82)
RDW: 16.6 % — ABNORMAL HIGH (ref 11.0–14.6)
WBC Count: 8.8 10*3/uL (ref 4.0–10.3)

## 2017-10-26 LAB — MAGNESIUM: MAGNESIUM: 1.9 mg/dL (ref 1.7–2.4)

## 2017-10-26 MED ORDER — PALONOSETRON HCL INJECTION 0.25 MG/5ML
0.2500 mg | Freq: Once | INTRAVENOUS | Status: AC
  Administered 2017-10-26: 0.25 mg via INTRAVENOUS

## 2017-10-26 MED ORDER — OXALIPLATIN CHEMO INJECTION 100 MG/20ML
67.0000 mg/m2 | Freq: Once | INTRAVENOUS | Status: AC
  Administered 2017-10-26: 150 mg via INTRAVENOUS
  Filled 2017-10-26: qty 10

## 2017-10-26 MED ORDER — ATROPINE SULFATE 1 MG/ML IJ SOLN
INTRAMUSCULAR | Status: AC
  Filled 2017-10-26: qty 1

## 2017-10-26 MED ORDER — SODIUM CHLORIDE 0.9 % IV SOLN
Freq: Once | INTRAVENOUS | Status: AC
  Administered 2017-10-26: 13:00:00 via INTRAVENOUS
  Filled 2017-10-26: qty 5

## 2017-10-26 MED ORDER — IRINOTECAN HCL CHEMO INJECTION 100 MG/5ML
150.0000 mg/m2 | Freq: Once | INTRAVENOUS | Status: AC
  Administered 2017-10-26: 340 mg via INTRAVENOUS
  Filled 2017-10-26: qty 17

## 2017-10-26 MED ORDER — SODIUM CHLORIDE 0.9 % IV SOLN
2400.0000 mg/m2 | INTRAVENOUS | Status: DC
  Administered 2017-10-26: 5350 mg via INTRAVENOUS
  Filled 2017-10-26: qty 107

## 2017-10-26 MED ORDER — ATROPINE SULFATE 1 MG/ML IJ SOLN
0.5000 mg | Freq: Once | INTRAMUSCULAR | Status: AC | PRN
  Administered 2017-10-26: 0.5 mg via INTRAVENOUS

## 2017-10-26 MED ORDER — DEXTROSE 5 % IV SOLN
Freq: Once | INTRAVENOUS | Status: AC
  Administered 2017-10-26: 12:00:00 via INTRAVENOUS

## 2017-10-26 MED ORDER — DEXTROSE 5 % IV SOLN
400.0000 mg/m2 | Freq: Once | INTRAVENOUS | Status: AC
  Administered 2017-10-26: 888 mg via INTRAVENOUS
  Filled 2017-10-26: qty 44.4

## 2017-10-26 MED ORDER — PALONOSETRON HCL INJECTION 0.25 MG/5ML
INTRAVENOUS | Status: AC
  Filled 2017-10-26: qty 5

## 2017-10-26 NOTE — Progress Notes (Signed)
Nutrition follow-up completed with patient during infusion for pancreas cancer. Weight documented is 209.5 pounds on April 25 decreased from 214.4 pounds April 11. Patient is experiencing cold sensitivity after oxaliplatin. He dislikes warm liquids and is struggling finding liquids to consume. He reports constipation is an issue. Has recently tried smaller amounts more often and feels this has been successful.  Nutrition diagnosis: Inadequate oral intake continues.  Intervention: Educated patient to continue strategies for small frequent meals and snacks. Try to encourage patient to find some type of liquid he could drink at room temperature. Brief education provided on constipation. Questions were answered.  Teach back method used.  Monitoring, evaluation, goals: Patient will tolerate increased calories and protein to minimize further weight loss.  Next visit: Thursday, May 9 during infusion.  **Disclaimer: This note was dictated with voice recognition software. Similar sounding words can inadvertently be transcribed and this note may contain transcription errors which may not have been corrected upon publication of note.**

## 2017-10-26 NOTE — Progress Notes (Signed)
[facility] OFFICE PROGRESS NOTE   Diagnosis: Pancreas cancer  INTERVAL HISTORY:   C[facility] Kelly returns as scheduled.  He completed another cycle of FOLFIRINOX beginning 10/12/2017.  No mouth sores.  He has tingling in the extremities.  No numbness.  He continues to have increased amounts of "gas ".  No abdominal pain.  He injured the left lower leg when he fell on steps.  No other bleeding.  He reports mild diarrhea.  Objective:  Vital signs in last 24 hours:  Blood pressure (!) 143/78, pulse 61, temperature 98.2 F (36.8 C), temperature source Oral, resp. rate 18, height 6' (1.829 m), weight 209 lb 8 oz (95 kg), SpO2 100 %.    HEENT: No thrush or ulcers Resp: Lungs clear bilaterally Cardio: Regular rate and rhythm GI: No hepatosplenomegaly, nontender, no mass Vascular: No leg edema Neuro: The vibratory sense is intact at the fingertips bilaterally Skin: Healing abrasion at the left pretibial area  Portacath/PICC-without erythema  Lab Results:  Lab Results  Component Value Date   WBC 8.8 10/26/2017   HGB 12.0 (L) 10/26/2017   HCT 35.1 (L) 10/26/2017   MCV 85.0 10/26/2017   PLT 110 (L) 10/26/2017   NEUTROABS 7.0 (H) 10/26/2017    CMP     Component Value Date/Time   NA 142 10/26/2017 1021   K 3.4 (L) 10/26/2017 1021   CL 106 10/26/2017 1021   CO2 27 10/26/2017 1021   GLUCOSE 124 10/26/2017 1021   BUN 5 (L) 10/26/2017 1021   CREATININE 0.83 10/26/2017 1021   CREATININE 1.27 10/27/2015 1600   CALCIUM 9.1 10/26/2017 1021   PROT 6.0 (L) 10/26/2017 1021   ALBUMIN 3.4 (L) 10/26/2017 1021   AST 35 (H) 10/26/2017 1021   ALT 75 (H) 10/26/2017 1021   ALKPHOS 211 (H) 10/26/2017 1021   BILITOT 0.9 10/26/2017 1021   GFRNONAA >60 10/26/2017 1021   GFRNONAA 62 10/27/2015 1600   GFRAA >60 10/26/2017 1021   GFRAA 72 10/27/2015 1600     Medications: I have reviewed the patient's current medications.   Assessment/Plan: 1. Pancreas head mass  CT  abdomen/pelvis 08/03/2017-fullness in the head of the pancreas with stranding in the peripancreatic fat and pancreatic ductal dilatation.   MRI abdomen 08/04/2017-findings favored to represent acute on chronic pancreatitis; equivocal soft tissue fullness within the head and uncinate process of the pancreas; indeterminate too small to characterize right hepatic lobe lesion; small volume abdominal ascites; splenomegaly.   CA-19-9 elevated at 977on 08/04/2017.  Status postupper EUS 08/10/2017-findings of an irregular masslike region in the pancreatic head measuring approximately 2.7 cm. This directly abutted the main portal vein but no other major vascular structures. Biopsies obtained with preliminary cytology positive for malignancy, likely adenocarcinoma. The final report is pending. The common bile duct and main pancreatic duct were both dilated. ERCP was then proceeded with forstent placement. Multiple attempts were made tocannulate the bile duct without success. The procedure was aborted.  08/16/2017 ERCP with placement of a metal biliary stent in the common bile duct by Dr. Francella Solian at [facility].  Cycle 1 FOLFIRINOX 08/31/2017  Cycle 2 FOLFIRINOX 09/14/2017  Cycle 3 FOLFIRINOX 09/28/2017  Cycle 4 FOLFIRINOX 10/12/2017 (oxaliplatin dose reduced secondary to thrombocytopenia)  Cycle 5 FOLFIRINOX 10/26/2017 2. Hypertension 3. Port-A-Cath placement 08/30/2017 4. History of elevated bilirubin-questionGilbert's syndrome 5. Mild leukopenia, thrombocytopenia-question secondary to portal hypertension/splenomegaly;09/14/2017 white count improved, platelet count stable   Disposition: Cory Kelly has completed 4 cycles of FOLFIRINOX.  He  has tolerated the chemotherapy well.  He will complete cycle 5 today.  We will follow-up on the CA 19-9 today.  He will increase the potassium supplement to twice daily.  The platelet count has improved with the dose reduction of oxalic platinum.  I will contact Dr.  Donnal Moat to discuss the timing of a restaging CT and surgery.  Cory Kelly will return for an office visit with the plan to complete cycle 6 FOLFIRINOX in 2 weeks. Cory Kelly will increase nutrition supplements in an attempt to gain weight.  25 minutes were spent with the patient today.  The majority of the time was used for counseling and coordination of care.   Cory Coder, Cory Kelly  10/26/2017  11:43 AM

## 2017-10-26 NOTE — Patient Instructions (Signed)
[facility] Discharge Instructions for Patients Receiving Chemotherapy  Today you received the following chemotherapy agents Oxaliplatin, Irinotecan, Leucovorin, and 5FU  To help prevent nausea and vomiting after your treatment, we encourage you to take your nausea medication as directed   If you develop nausea and vomiting that is not controlled by your nausea medication, call the clinic.   BELOW ARE SYMPTOMS THAT SHOULD BE REPORTED IMMEDIATELY:  *FEVER GREATER THAN 100.5 F  *CHILLS WITH OR WITHOUT FEVER  NAUSEA AND VOMITING THAT IS NOT CONTROLLED WITH YOUR NAUSEA MEDICATION  *UNUSUAL SHORTNESS OF BREATH  *UNUSUAL BRUISING OR BLEEDING  TENDERNESS IN MOUTH AND THROAT WITH OR WITHOUT PRESENCE OF ULCERS  *URINARY PROBLEMS  *BOWEL PROBLEMS  UNUSUAL RASH Items with * indicate a potential emergency and should be followed up as soon as possible.  Feel free to call the clinic should you have any questions or concerns. The clinic phone number is [phone].  Please show the CHEMO ALERT CARD at check-in to the Emergency Department and triage nurse.   

## 2017-10-27 LAB — CANCER ANTIGEN 19-9: CA 19-9: 119 U/mL — ABNORMAL HIGH (ref 0–35)

## 2017-10-28 ENCOUNTER — Inpatient Hospital Stay: Payer: BLUE CROSS/BLUE SHIELD

## 2017-10-28 DIAGNOSIS — C250 Malignant neoplasm of head of pancreas: Secondary | ICD-10-CM

## 2017-10-28 DIAGNOSIS — Z5111 Encounter for antineoplastic chemotherapy: Secondary | ICD-10-CM | POA: Diagnosis not present

## 2017-10-28 DIAGNOSIS — Z95828 Presence of other vascular implants and grafts: Secondary | ICD-10-CM

## 2017-10-28 DIAGNOSIS — Z5189 Encounter for other specified aftercare: Secondary | ICD-10-CM | POA: Diagnosis not present

## 2017-10-28 MED ORDER — PEGFILGRASTIM-CBQV 6 MG/0.6ML ~~LOC~~ SOSY
6.0000 mg | PREFILLED_SYRINGE | Freq: Once | SUBCUTANEOUS | Status: AC
Start: 2017-10-28 — End: 2017-10-28
  Administered 2017-10-28: 6 mg via SUBCUTANEOUS

## 2017-10-28 MED ORDER — PEGFILGRASTIM-CBQV 6 MG/0.6ML ~~LOC~~ SOSY
PREFILLED_SYRINGE | SUBCUTANEOUS | Status: AC
  Filled 2017-10-28: qty 0.6

## 2017-10-28 MED ORDER — HEPARIN SOD (PORK) LOCK FLUSH 100 UNIT/ML IV SOLN
500.0000 [IU] | Freq: Once | INTRAVENOUS | Status: AC
  Administered 2017-10-28: 500 [IU]
  Filled 2017-10-28: qty 5

## 2017-10-28 MED ORDER — SODIUM CHLORIDE 0.9% FLUSH
10.0000 mL | Freq: Once | INTRAVENOUS | Status: AC
  Administered 2017-10-28: 10 mL
  Filled 2017-10-28: qty 10

## 2017-10-30 NOTE — Progress Notes (Signed)
Dr. Rosezella Florida nurse from [facility] called to relay that Dr. Donnal Moat would like patient to have his 6th chemo treatment on 11/09/17 and then patient will be scheduled two weeks after that at [facility] for CT scan and labs. I relayed this information to Dr. Benay Spice and patient's wife, [name].

## 2017-11-02 ENCOUNTER — Encounter: Payer: Self-pay | Admitting: Oncology

## 2017-11-03 ENCOUNTER — Other Ambulatory Visit: Payer: Self-pay | Admitting: *Deleted

## 2017-11-05 ENCOUNTER — Other Ambulatory Visit: Payer: Self-pay | Admitting: Oncology

## 2017-11-09 ENCOUNTER — Inpatient Hospital Stay: Payer: BLUE CROSS/BLUE SHIELD

## 2017-11-09 ENCOUNTER — Inpatient Hospital Stay (HOSPITAL_BASED_OUTPATIENT_CLINIC_OR_DEPARTMENT_OTHER): Payer: BLUE CROSS/BLUE SHIELD | Admitting: Oncology

## 2017-11-09 ENCOUNTER — Inpatient Hospital Stay: Payer: BLUE CROSS/BLUE SHIELD | Admitting: Nutrition

## 2017-11-09 ENCOUNTER — Telehealth: Payer: Self-pay | Admitting: Oncology

## 2017-11-09 ENCOUNTER — Inpatient Hospital Stay: Payer: BLUE CROSS/BLUE SHIELD | Attending: Oncology

## 2017-11-09 VITALS — BP 154/82 | HR 60 | Temp 97.7°F | Resp 17 | Ht 72.0 in | Wt 212.6 lb

## 2017-11-09 DIAGNOSIS — Z5111 Encounter for antineoplastic chemotherapy: Secondary | ICD-10-CM | POA: Insufficient documentation

## 2017-11-09 DIAGNOSIS — C250 Malignant neoplasm of head of pancreas: Secondary | ICD-10-CM

## 2017-11-09 DIAGNOSIS — Z5189 Encounter for other specified aftercare: Secondary | ICD-10-CM | POA: Diagnosis not present

## 2017-11-09 DIAGNOSIS — G620 Drug-induced polyneuropathy: Secondary | ICD-10-CM | POA: Insufficient documentation

## 2017-11-09 DIAGNOSIS — I10 Essential (primary) hypertension: Secondary | ICD-10-CM

## 2017-11-09 DIAGNOSIS — Z95828 Presence of other vascular implants and grafts: Secondary | ICD-10-CM

## 2017-11-09 LAB — CMP (CANCER CENTER ONLY)
ALT: 59 U/L — ABNORMAL HIGH (ref 0–55)
ANION GAP: 8 (ref 3–11)
AST: 31 U/L (ref 5–34)
Albumin: 3.3 g/dL — ABNORMAL LOW (ref 3.5–5.0)
Alkaline Phosphatase: 222 U/L — ABNORMAL HIGH (ref 40–150)
BUN: 5 mg/dL — ABNORMAL LOW (ref 7–26)
CHLORIDE: 106 mmol/L (ref 98–109)
CO2: 26 mmol/L (ref 22–29)
Calcium: 8.7 mg/dL (ref 8.4–10.4)
Creatinine: 0.81 mg/dL (ref 0.70–1.30)
GFR, Est AFR Am: >60 mL/min (ref >60)
Glucose, Bld: 172 mg/dL — ABNORMAL HIGH (ref 70–140)
POTASSIUM: 3.1 mmol/L — AB (ref 3.5–5.1)
Sodium: 140 mmol/L (ref 136–145)
TOTAL PROTEIN: 5.8 g/dL — AB (ref 6.4–8.3)
Total Bilirubin: 0.7 mg/dL (ref 0.2–1.2)

## 2017-11-09 LAB — CBC WITH DIFFERENTIAL (CANCER CENTER ONLY)
BASOS ABS: 0 10*3/uL (ref 0.0–0.1)
BASOS PCT: 0 %
EOS ABS: 0.1 10*3/uL (ref 0.0–0.5)
Eosinophils Relative: 1 %
HCT: 34.5 % — ABNORMAL LOW (ref 38.4–49.9)
Hemoglobin: 11.7 g/dL — ABNORMAL LOW (ref 13.0–17.1)
Lymphocytes Relative: 10 %
Lymphs Abs: 0.8 10*3/uL — ABNORMAL LOW (ref 0.9–3.3)
MCH: 29.1 pg (ref 27.2–33.4)
MCHC: 34 g/dL (ref 32.0–36.0)
MCV: 85.6 fL (ref 79.3–98.0)
MONO ABS: 0.4 10*3/uL (ref 0.1–0.9)
Monocytes Relative: 5 %
NEUTROS ABS: 6.7 10*3/uL — AB (ref 1.5–6.5)
Neutrophils Relative %: 84 %
PLATELETS: 110 10*3/uL — AB (ref 140–400)
RBC: 4.04 MIL/uL — ABNORMAL LOW (ref 4.20–5.82)
RDW: 17.8 % — AB (ref 11.0–14.6)
WBC Count: 8 10*3/uL (ref 4.0–10.3)

## 2017-11-09 MED ORDER — IRINOTECAN HCL CHEMO INJECTION 100 MG/5ML
150.0000 mg/m2 | Freq: Once | INTRAVENOUS | Status: AC
  Administered 2017-11-09: 340 mg via INTRAVENOUS
  Filled 2017-11-09: qty 17

## 2017-11-09 MED ORDER — SODIUM CHLORIDE 0.9 % IV SOLN
2400.0000 mg/m2 | INTRAVENOUS | Status: DC
  Administered 2017-11-09: 5350 mg via INTRAVENOUS
  Filled 2017-11-09: qty 107

## 2017-11-09 MED ORDER — DEXTROSE 5 % IV SOLN
400.0000 mg/m2 | Freq: Once | INTRAVENOUS | Status: AC
  Administered 2017-11-09: 888 mg via INTRAVENOUS
  Filled 2017-11-09: qty 44.4

## 2017-11-09 MED ORDER — ATROPINE SULFATE 1 MG/ML IJ SOLN
0.5000 mg | Freq: Once | INTRAMUSCULAR | Status: AC | PRN
  Administered 2017-11-09: 0.5 mg via INTRAVENOUS

## 2017-11-09 MED ORDER — DEXTROSE 5 % IV SOLN
68.0000 mg/m2 | Freq: Once | INTRAVENOUS | Status: AC
  Administered 2017-11-09: 150 mg via INTRAVENOUS
  Filled 2017-11-09: qty 20

## 2017-11-09 MED ORDER — PALONOSETRON HCL INJECTION 0.25 MG/5ML
0.2500 mg | Freq: Once | INTRAVENOUS | Status: AC
  Administered 2017-11-09: 0.25 mg via INTRAVENOUS

## 2017-11-09 MED ORDER — PALONOSETRON HCL INJECTION 0.25 MG/5ML
INTRAVENOUS | Status: AC
  Filled 2017-11-09: qty 5

## 2017-11-09 MED ORDER — SODIUM CHLORIDE 0.9% FLUSH
10.0000 mL | Freq: Once | INTRAVENOUS | Status: AC
  Administered 2017-11-09: 10 mL
  Filled 2017-11-09: qty 10

## 2017-11-09 MED ORDER — ATROPINE SULFATE 1 MG/ML IJ SOLN
INTRAMUSCULAR | Status: AC
  Filled 2017-11-09: qty 1

## 2017-11-09 MED ORDER — SODIUM CHLORIDE 0.9 % IV SOLN
Freq: Once | INTRAVENOUS | Status: AC
  Administered 2017-11-09: 12:00:00 via INTRAVENOUS
  Filled 2017-11-09: qty 5

## 2017-11-09 MED ORDER — DEXTROSE 5 % IV SOLN
Freq: Once | INTRAVENOUS | Status: AC
  Administered 2017-11-09: 11:00:00 via INTRAVENOUS

## 2017-11-09 NOTE — Patient Instructions (Signed)
[facility] Discharge Instructions for Patients Receiving Chemotherapy  Today you received the following chemotherapy agents Oxaliplatin, Leucovorin, Irinotecan, and 5FU  To help prevent nausea and vomiting after your treatment, we encourage you to take your nausea medication as directed   If you develop nausea and vomiting that is not controlled by your nausea medication, call the clinic.   BELOW ARE SYMPTOMS THAT SHOULD BE REPORTED IMMEDIATELY:  *FEVER GREATER THAN 100.5 F  *CHILLS WITH OR WITHOUT FEVER  NAUSEA AND VOMITING THAT IS NOT CONTROLLED WITH YOUR NAUSEA MEDICATION  *UNUSUAL SHORTNESS OF BREATH  *UNUSUAL BRUISING OR BLEEDING  TENDERNESS IN MOUTH AND THROAT WITH OR WITHOUT PRESENCE OF ULCERS  *URINARY PROBLEMS  *BOWEL PROBLEMS  UNUSUAL RASH Items with * indicate a potential emergency and should be followed up as soon as possible.  Feel free to call the clinic should you have any questions or concerns. The clinic phone number is [phone].  Please show the CHEMO ALERT CARD at check-in to the Emergency Department and triage nurse.   

## 2017-11-09 NOTE — Patient Instructions (Signed)

## 2017-11-09 NOTE — Progress Notes (Signed)
[facility] OFF[facility] PROGRESS NOTE   Diagnosis: Pancreas cancer  INTERVAL HISTORY:   Cory Kelly returns as scheduled.  He completed another cycle of FOLFIRINOX on 10/26/2017.  He tolerated the FOLFIRINOX well.  No nausea/vomiting or diarrhea.  He had increased fatigue following this cycle of chemotherapy.  He has intermittent tingling in the extremities.  This does not interfere with activity.  He continues to have intermittent "gas "pain in the abdomen.  He is exercising.  Objective:  Vital signs in last 24 hours:  Blood pressure (!) 154/82, pulse 60, temperature 97.7 F (36.5 C), temperature source Oral, resp. rate 17, height 6' (1.829 m), weight 212 lb 9.6 oz (96.4 kg), SpO2 100 %.    HEENT: No thrush or ulcers Resp: Lungs clear bilaterally Cardio: Regular rate and rhythm GI: No hepatosplenomegaly, nontender, no mass Vascular: No leg edema Neuro: Very mild loss of vibratory sense at the fingertips bilaterally    Portacath/PICC-without erythema  Lab Results:  Lab Results  Component Value Date   WBC 8.0 11/09/2017   HGB 11.7 (L) 11/09/2017   HCT 34.5 (L) 11/09/2017   MCV 85.6 11/09/2017   PLT 110 (L) 11/09/2017   NEUTROABS 6.7 (H) 11/09/2017    CMP     Component Value Date/Time   NA 140 11/09/2017 0907   K 3.1 (L) 11/09/2017 0907   CL 106 11/09/2017 0907   CO2 26 11/09/2017 0907   GLUCOSE 172 (H) 11/09/2017 0907   BUN 5 (L) 11/09/2017 0907   CREATININE 0.81 11/09/2017 0907   CREATININE 1.27 10/27/2015 1600   CALCIUM 8.7 11/09/2017 0907   PROT 5.8 (L) 11/09/2017 0907   ALBUMIN 3.3 (L) 11/09/2017 0907   AST 31 11/09/2017 0907   ALT 59 (H) 11/09/2017 0907   ALKPHOS 222 (H) 11/09/2017 0907   BILITOT 0.7 11/09/2017 0907   GFRNONAA >60 11/09/2017 0907   GFRNONAA 62 10/27/2015 1600   GFRAA >60 11/09/2017 0907   GFRAA 72 10/27/2015 1600    Medications: I have reviewed the patient's current medications.   Assessment/Plan: 1. Pancreas head  mass  CT abdomen/pelvis 08/03/2017-fullness in the head of the pancreas with stranding in the peripancreatic fat and pancreatic ductal dilatation.   MRI abdomen 08/04/2017-findings favored to represent acute on chronic pancreatitis; equivocal soft tissue fullness within the head and uncinate process of the pancreas; indeterminate too small to characterize right hepatic lobe lesion; small volume abdominal ascites; splenomegaly.   CA-19-9 elevated at 977on 08/04/2017.  Status postupper EUS 08/10/2017-findings of an irregular masslike region in the pancreatic head measuring approximately 2.7 cm. This directly abutted the main portal vein but no other major vascular structures. Biopsies obtained with preliminary cytology positive for malignancy, likely adenocarcinoma. The final report is pending. The common bile duct and main pancreatic duct were both dilated. ERCP was then proceeded with forstent placement. Multiple attempts were made tocannulate the bile duct without success. The procedure was aborted.  08/16/2017 ERCP with placement of a metal biliary stent in the common bile duct by [facility] at [facility].  Cycle 1 FOLFIRINOX 08/31/2017  Cycle 2 FOLFIRINOX 09/14/2017  Cycle 3 FOLFIRINOX 09/28/2017  Cycle 4 FOLFIRINOX 10/12/2017 (oxaliplatin dose reduced secondary to thrombocytopenia)  Cycle 5 FOLFIRINOX 10/26/2017  Cycle 6 FOLFIRINOX 11/09/2017 2. Hypertension 3. Port-A-Cath placement 08/30/2017 4. History of elevated bilirubin-questionGilbert's syndrome 5. Mild leukopenia, thrombocytopenia-question secondary to portal hypertension/splenomegaly;09/14/2017 white count improved, platelet count stable    Disposition: Cory Kelly has completed 5 cycles of FOLFIRINOX.  He has tolerated the FOLFIRINOX well.  The CA 19-9 was lower prior to cycle 5.  We will follow-up on the CA 19-9 from today.  He will complete cycle 6 FOLFIRINOX today.  He is scheduled for a restaging CT evaluation and office  visit with Dr. Donnal Kelly on 11/29/2017.  He will return for an office visit here later that week.    [facility] Coder, MD  11/09/2017  10:36 AM

## 2017-11-09 NOTE — Telephone Encounter (Signed)
Scheduled appt per 5/9 los - left message with appt date and time.

## 2017-11-09 NOTE — Progress Notes (Signed)
Nutrition follow-up completed with patient during infusion for pancreas cancer.   Weight improved and documented as 212.6 pounds on May 9, increased from 209.5 pounds April 25.   Patient has found ways to tolerate warmer beverages after oxaliplatin. He reports constipation has improved. He essentially denies nutrition impact symptoms.  Nutrition diagnosis: Inadequate oral intake improved.  Intervention: Educated patient to continue strategies for adequate calorie and protein intake for weight maintenance. Encourage patient to be diligent about protein intake especially before and after potential surgery. Questions were answered teach back method used.  Patient has my contact information.  Monitoring, evaluation, goals: Patient will tolerate increased calories and protein for weight maintenance and healing. Patient will contact me for questions or concerns.  **Disclaimer: This note was dictated with voice recognition software. Similar sounding words can inadvertently be transcribed and this note may contain transcription errors which may not have been corrected upon publication of note.**

## 2017-11-10 ENCOUNTER — Other Ambulatory Visit: Payer: Self-pay

## 2017-11-10 DIAGNOSIS — C250 Malignant neoplasm of head of pancreas: Secondary | ICD-10-CM

## 2017-11-10 LAB — CANCER ANTIGEN 19-9: CA 19-9: 82 U/mL — ABNORMAL HIGH (ref 0–35)

## 2017-11-10 MED ORDER — POTASSIUM CHLORIDE ER 10 MEQ PO CPCR: 10.0000 meq | ORAL_CAPSULE | Freq: Two times a day (BID) | ORAL | 0 refills | Status: DC

## 2017-11-10 MED ORDER — POTASSIUM CHLORIDE ER 10 MEQ PO CPCR: ORAL_CAPSULE | ORAL | 0 refills | Status: DC

## 2017-11-11 ENCOUNTER — Inpatient Hospital Stay: Payer: BLUE CROSS/BLUE SHIELD

## 2017-11-11 VITALS — BP 156/93 | HR 61 | Temp 98.6°F | Resp 18

## 2017-11-11 DIAGNOSIS — C250 Malignant neoplasm of head of pancreas: Secondary | ICD-10-CM | POA: Diagnosis not present

## 2017-11-11 DIAGNOSIS — Z5111 Encounter for antineoplastic chemotherapy: Secondary | ICD-10-CM | POA: Diagnosis not present

## 2017-11-11 DIAGNOSIS — Z5189 Encounter for other specified aftercare: Secondary | ICD-10-CM | POA: Diagnosis not present

## 2017-11-11 DIAGNOSIS — G620 Drug-induced polyneuropathy: Secondary | ICD-10-CM | POA: Diagnosis not present

## 2017-11-11 MED ORDER — PEGFILGRASTIM-CBQV 6 MG/0.6ML ~~LOC~~ SOSY
PREFILLED_SYRINGE | SUBCUTANEOUS | Status: AC
  Filled 2017-11-11: qty 0.6

## 2017-11-11 MED ORDER — PEGFILGRASTIM-CBQV 6 MG/0.6ML ~~LOC~~ SOSY
6.0000 mg | PREFILLED_SYRINGE | Freq: Once | SUBCUTANEOUS | Status: AC
  Administered 2017-11-11: 6 mg via SUBCUTANEOUS

## 2017-11-11 MED ORDER — SODIUM CHLORIDE 0.9% FLUSH
10.0000 mL | INTRAVENOUS | Status: DC | PRN
  Administered 2017-11-11: 10 mL
  Filled 2017-11-11: qty 10

## 2017-11-11 MED ORDER — HEPARIN SOD (PORK) LOCK FLUSH 100 UNIT/ML IV SOLN
500.0000 [IU] | Freq: Once | INTRAVENOUS | Status: AC | PRN
  Administered 2017-11-11: 500 [IU]
  Filled 2017-11-11: qty 5

## 2017-11-11 NOTE — Patient Instructions (Signed)
Implanted Port Home Guide An implanted port is a type of central line that is placed under the skin. Central lines are used to provide IV access when treatment or nutrition needs to be given through a person's veins. Implanted ports are used for long-term IV access. An implanted port may be placed because:  You need IV medicine that would be irritating to the small veins in your hands or arms.  You need long-term IV medicines, such as antibiotics.  You need IV nutrition for a long period.  You need frequent blood draws for lab tests.  You need dialysis.  Implanted ports are usually placed in the chest area, but they can also be placed in the upper arm, the abdomen, or the leg. An implanted port has two main parts:  Reservoir. The reservoir is round and will appear as a small, raised area under your skin. The reservoir is the part where a needle is inserted to give medicines or draw blood.  Catheter. The catheter is a thin, flexible tube that extends from the reservoir. The catheter is placed into a large vein. Medicine that is inserted into the reservoir goes into the catheter and then into the vein.  How will I care for my incision site? Do not get the incision site wet. Bathe or shower as directed by your health care provider. How is my port accessed? Special steps must be taken to access the port:  Before the port is accessed, a numbing cream can be placed on the skin. This helps numb the skin over the port site.  Your health care provider uses a sterile technique to access the port. ? Your health care provider must put on a mask and sterile gloves. ? The skin over your port is cleaned carefully with an antiseptic and allowed to dry. ? The port is gently pinched between sterile gloves, and a needle is inserted into the port.  Only "non-coring" port needles should be used to access the port. Once the port is accessed, a blood return should be checked. This helps ensure that the port  is in the vein and is not clogged.  If your port needs to remain accessed for a constant infusion, a clear (transparent) bandage will be placed over the needle site. The bandage and needle will need to be changed every week, or as directed by your health care provider.  Keep the bandage covering the needle clean and dry. Do not get it wet. Follow your health care provider's instructions on how to take a shower or bath while the port is accessed.  If your port does not need to stay accessed, no bandage is needed over the port.  What is flushing? Flushing helps keep the port from getting clogged. Follow your health care provider's instructions on how and when to flush the port. Ports are usually flushed with saline solution or a medicine called heparin. The need for flushing will depend on how the port is used.  If the port is used for intermittent medicines or blood draws, the port will need to be flushed: ? After medicines have been given. ? After blood has been drawn. ? As part of routine maintenance.  If a constant infusion is running, the port may not need to be flushed.  How long will my port stay implanted? The port can stay in for as long as your health care provider thinks it is needed. When it is time for the port to come out, surgery will be   done to remove it. The procedure is similar to the one performed when the port was put in. When should I seek immediate medical care? When you have an implanted port, you should seek immediate medical care if:  You notice a bad smell coming from the incision site.  You have swelling, redness, or drainage at the incision site.  You have more swelling or pain at the port site or the surrounding area.  You have a fever that is not controlled with medicine.  This information is not intended to replace advice given to you by your health care provider. Make sure you discuss any questions you have with your health care provider. Document  Released: 06/20/2005 Document Revised: 11/26/2015 Document Reviewed: 02/25/2013 Elsevier Interactive Patient Education  2017 Elsevier Inc.  

## 2017-11-15 ENCOUNTER — Other Ambulatory Visit: Payer: Self-pay | Admitting: *Deleted

## 2017-11-15 DIAGNOSIS — C250 Malignant neoplasm of head of pancreas: Secondary | ICD-10-CM

## 2017-11-15 MED ORDER — TAMSULOSIN HCL 0.4 MG PO CAPS: 0.4000 mg | ORAL_CAPSULE | Freq: Every day | ORAL | 0 refills | Status: DC

## 2017-11-20 ENCOUNTER — Other Ambulatory Visit: Payer: Self-pay | Admitting: Physician Assistant

## 2017-11-21 ENCOUNTER — Other Ambulatory Visit: Payer: Self-pay | Admitting: Emergency Medicine

## 2017-11-21 DIAGNOSIS — C250 Malignant neoplasm of head of pancreas: Secondary | ICD-10-CM

## 2017-11-21 MED ORDER — TAMSULOSIN HCL 0.4 MG PO CAPS: 0.4000 mg | ORAL_CAPSULE | Freq: Every day | ORAL | 0 refills | Status: DC

## 2017-11-29 ENCOUNTER — Encounter: Payer: Self-pay | Admitting: Nurse Practitioner

## 2017-11-29 DIAGNOSIS — Z9221 Personal history of antineoplastic chemotherapy: Secondary | ICD-10-CM | POA: Diagnosis not present

## 2017-11-29 DIAGNOSIS — C250 Malignant neoplasm of head of pancreas: Secondary | ICD-10-CM | POA: Diagnosis not present

## 2017-11-29 DIAGNOSIS — K8681 Exocrine pancreatic insufficiency: Secondary | ICD-10-CM | POA: Diagnosis not present

## 2017-11-29 DIAGNOSIS — K869 Disease of pancreas, unspecified: Secondary | ICD-10-CM | POA: Diagnosis not present

## 2017-11-29 DIAGNOSIS — R911 Solitary pulmonary nodule: Secondary | ICD-10-CM | POA: Diagnosis not present

## 2017-11-30 ENCOUNTER — Encounter: Payer: Self-pay | Admitting: Nurse Practitioner

## 2017-11-30 ENCOUNTER — Inpatient Hospital Stay (HOSPITAL_BASED_OUTPATIENT_CLINIC_OR_DEPARTMENT_OTHER): Payer: BLUE CROSS/BLUE SHIELD | Admitting: Nurse Practitioner

## 2017-11-30 DIAGNOSIS — G620 Drug-induced polyneuropathy: Secondary | ICD-10-CM

## 2017-11-30 DIAGNOSIS — Z5189 Encounter for other specified aftercare: Secondary | ICD-10-CM | POA: Diagnosis not present

## 2017-11-30 DIAGNOSIS — I10 Essential (primary) hypertension: Secondary | ICD-10-CM

## 2017-11-30 DIAGNOSIS — C250 Malignant neoplasm of head of pancreas: Secondary | ICD-10-CM

## 2017-11-30 DIAGNOSIS — Z5111 Encounter for antineoplastic chemotherapy: Secondary | ICD-10-CM | POA: Diagnosis not present

## 2017-11-30 MED ORDER — POTASSIUM CHLORIDE ER 10 MEQ PO CPCR: ORAL_CAPSULE | ORAL | 2 refills | Status: DC

## 2017-11-30 NOTE — Progress Notes (Signed)
DISCONTINUE ON PATHWAY REGIMEN - Pancreatic     A cycle is every 14 days:     Oxaliplatin      Leucovorin      Irinotecan      5-Fluorouracil      5-Fluorouracil   **Always confirm dose/schedule in your pharmacy ordering system**    REASON: Continuation Of Treatment PRIOR TREATMENT: [id number]: FOLFIRINOX q14 Days x 4 Cycles TREATMENT RESPONSE: Partial Response (PR)  START OFF PATHWAY REGIMEN - Pancreatic   [id number]:Nab-Paclitaxel (Abraxane(R)) 125 mg/m2 D1, 8, 15 + Gemcitabine 1,000 mg/m2 D1, 8, 15 q28 Days:   A cycle is every 28 days:     Nab-paclitaxel (protein bound)      Gemcitabine   **Always confirm dose/schedule in your pharmacy ordering system**    Patient Characteristics: Adenocarcinoma, Borderline Resectable or High Risk, Potentially Resectable, Primary Neoadjuvant Therapy, PS = 0, 1 Histology: Adenocarcinoma Current evidence of distant metastases<= No AJCC T Category: Staged < 8th Ed. AJCC N Category: Staged < 8th Ed. AJCC M Category: Staged < 8th Ed. AJCC 8 Stage Grouping: Staged < 8th Ed. Intent of Therapy: Curative Intent, Discussed with Patient

## 2017-11-30 NOTE — Progress Notes (Addendum)
[place] OFFICE PROGRESS NOTE   Diagnosis: Pancreas cancer  INTERVAL HISTORY:   Cory Kelly returns as scheduled.  He completed cycle 6 FOLFIRINOX 11/09/2017.  He denies nausea/vomiting.  He did have some diarrhea which was controlled with Imodium.  No mouth sores.  He has persistent numbness in the feet, tingling in the fingertips.  Symptoms do not interfere with activity.  He has a good appetite.  He had an episode of abdominal pain recently.  Objective:  Vital signs in last 24 hours:  Blood pressure (!) 165/88, pulse 74, temperature 98 F (36.7 C), temperature source Oral, resp. rate 18, height 6' (1.829 m), weight 216 lb 1.6 oz (98 kg), SpO2 98 %.    HEENT: No thrush or ulcers. Resp: Lungs clear bilaterally. Cardio: Regular rate and rhythm. GI: Abdomen soft and nontender.  No hepatomegaly.  No mass. Vascular: No leg edema. Neuro: Vibratory sense mildly decreased over the fingertips per tuning fork exam. Skin: Palms without erythema. Port-A-Cath without erythema.   Lab Results:  Lab Results  Component Value Date   WBC 8.0 11/09/2017   HGB 11.7 (L) 11/09/2017   HCT 34.5 (L) 11/09/2017   MCV 85.6 11/09/2017   PLT 110 (L) 11/09/2017   NEUTROABS 6.7 (H) 11/09/2017    Imaging:  No results found.  Medications: I have reviewed the patient's current medications.  Assessment/Plan: 1. Pancreas head mass  CT abdomen/pelvis 08/03/2017-fullness in the head of the pancreas with stranding in the peripancreatic fat and pancreatic ductal dilatation.   MRI abdomen 08/04/2017-findings favored to represent acute on chronic pancreatitis; equivocal soft tissue fullness within the head and uncinate process of the pancreas; indeterminate too small to characterize right hepatic lobe lesion; small volume abdominal ascites; splenomegaly.   CA-19-9 elevated at 977on 08/04/2017.  Status postupper EUS 08/10/2017-findings of an irregular masslike region in the pancreatic head  measuring approximately 2.7 cm. This directly abutted the main portal vein but no other major vascular structures. Biopsies obtained with preliminary cytology positive for malignancy, likely adenocarcinoma. The final report is pending. The common bile duct and main pancreatic duct were both dilated. ERCP was then proceeded with forstent placement. Multiple attempts were made tocannulate the bile duct without success. The procedure was aborted.  08/16/2017 ERCP with placement of a metal biliary stent in the common bile duct by Dr. Francella Solian at [facility].  Cycle 1 FOLFIRINOX 08/31/2017  Cycle 2 FOLFIRINOX 09/14/2017  Cycle 3 FOLFIRINOX 09/28/2017  Cycle 4 FOLFIRINOX 10/12/2017 (oxaliplatin dose reduced secondary to thrombocytopenia)  Cycle 5 FOLFIRINOX 10/26/2017  Cycle 6 FOLFIRINOX 11/09/2017  Restaging CTs at [facility] 11/29/2017- no definitive evidence of distant metastatic disease.  2 subcentimeter liver lesions described on prior MRI not visualized on CT.  Ill-defined pancreatic head mass stable to decreased in size measuring 2.1 x 2 cm.  Peripancreatic inflammatory stranding decreased from prior.  No biliary ductal dilatation.  Celiac axis less than 180 degrees abutment.  Common hepatic artery with greater than 180 degrees encasement; superior mesenteric artery with short segment less than 180 degrees abutment; portal vein/superior mesenteric vein with greater than 180 degrees encasement of the extrahepatic portal vein with associated circumferential narrowing at the portomesenteric, overall substantially improved from prior examination.  Now less than 180 degree abutment of the SMV.  The portal vein and SMV remain patent.  Splenic vein patent. 2. Hypertension 3. Port-A-Cath placement 08/30/2017 4. History of elevated bilirubin-questionGilbert's syndrome 5. Mild leukopenia, thrombocytopenia-question secondary to portal hypertension/splenomegaly;09/14/2017 white count improved, platelet count  stable     Disposition: Cory Kelly appears well.  He has completed 6 cycles of FOLFIRINOX.  Recent restaging CTs at [facility] show no evidence of metastatic disease.  He continues to have significant vascular involvement.  Dr. Benay Spice has spoken with Dr. Berneice Gandy at [facility].  The recommendation is for continuation of chemotherapy with gemcitabine/Abraxane on a 2-week schedule for 3 months followed by restaging evaluation.  We reviewed potential toxicities associated with the chemotherapy including bone marrow toxicity, hair loss, nausea, allergic reaction.  We reviewed the potential for fever, rash, pneumonitis with gemcitabine.  We discussed the neuropathy associated with Abraxane.  He agrees to proceed.  He will return for cycle 1 12/06/2017.  We will see him in follow-up prior to cycle 2 12/20/2017.  He will contact the office in the interim with any problems.  Patient seen with Dr. Benay Spice.  40 minutes were spent face-to-face at today's visit with the majority of that time involved in counseling/coordination of care.      Ned Card ANP/GNP-BC   11/30/2017  3:20 PM  This was a shared visit with Ned Card.  Cory Kelly has completed 6 cycles of FOLFIRINOX.  He continues to have borderline resectable disease.  Dr. Donnal Moat recommends continuing neoadjuvant therapy.  I discussed the case with Dr. Berneice Gandy.  We discussed options of chemotherapy/radiation and chemotherapy.  Dr. Berneice Gandy recommends gemcitabine/Abraxane given every 2 weeks.  We reviewed potential toxicities associated with the gemcitabine/Abraxane regimen.  Cory Kelly agrees to proceed.  The plan is to refer him for radiation if the tumor remains borderline or unresectable following additional chemotherapy.  Julieanne Manson, MD

## 2017-12-01 ENCOUNTER — Telehealth: Payer: Self-pay

## 2017-12-01 NOTE — Telephone Encounter (Signed)
Called and verified new appointments set for 6/5. Per 5/30 los

## 2017-12-03 ENCOUNTER — Other Ambulatory Visit: Payer: Self-pay | Admitting: Oncology

## 2017-12-03 ENCOUNTER — Encounter: Payer: Self-pay | Admitting: Nurse Practitioner

## 2017-12-06 ENCOUNTER — Other Ambulatory Visit: Payer: Self-pay | Admitting: Oncology

## 2017-12-06 ENCOUNTER — Inpatient Hospital Stay: Payer: BLUE CROSS/BLUE SHIELD

## 2017-12-06 ENCOUNTER — Inpatient Hospital Stay: Payer: BLUE CROSS/BLUE SHIELD | Attending: Nurse Practitioner

## 2017-12-06 VITALS — BP 159/89 | HR 59 | Temp 98.4°F | Resp 18

## 2017-12-06 DIAGNOSIS — C250 Malignant neoplasm of head of pancreas: Secondary | ICD-10-CM | POA: Insufficient documentation

## 2017-12-06 DIAGNOSIS — Z5111 Encounter for antineoplastic chemotherapy: Secondary | ICD-10-CM | POA: Insufficient documentation

## 2017-12-06 LAB — CBC WITH DIFFERENTIAL (CANCER CENTER ONLY)
BASOS ABS: 0 10*3/uL (ref 0.0–0.1)
BASOS PCT: 1 %
Eosinophils Absolute: 0.1 10*3/uL (ref 0.0–0.5)
Eosinophils Relative: 2 %
HEMATOCRIT: 38.1 % — AB (ref 38.4–49.9)
HEMOGLOBIN: 12.7 g/dL — AB (ref 13.0–17.1)
Lymphocytes Relative: 11 %
Lymphs Abs: 0.6 10*3/uL — ABNORMAL LOW (ref 0.9–3.3)
MCH: 30 pg (ref 27.2–33.4)
MCHC: 33.3 g/dL (ref 32.0–36.0)
MCV: 90.1 fL (ref 79.3–98.0)
MONO ABS: 0.6 10*3/uL (ref 0.1–0.9)
Monocytes Relative: 11 %
NEUTROS ABS: 3.9 10*3/uL (ref 1.5–6.5)
NEUTROS PCT: 75 %
Platelet Count: 96 10*3/uL — ABNORMAL LOW (ref 140–400)
RBC: 4.23 MIL/uL (ref 4.20–5.82)
RDW: 15.5 % — AB (ref 11.0–14.6)
WBC Count: 5.2 10*3/uL (ref 4.0–10.3)

## 2017-12-06 LAB — CMP (CANCER CENTER ONLY)
ALK PHOS: 253 U/L — AB (ref 40–150)
ALT: 103 U/L — ABNORMAL HIGH (ref 0–55)
ANION GAP: 10 (ref 3–11)
AST: 49 U/L — ABNORMAL HIGH (ref 5–34)
Albumin: 3.3 g/dL — ABNORMAL LOW (ref 3.5–5.0)
BUN: 9 mg/dL (ref 7–26)
CALCIUM: 8.9 mg/dL (ref 8.4–10.4)
CO2: 27 mmol/L (ref 22–29)
Chloride: 101 mmol/L (ref 98–109)
Creatinine: 0.84 mg/dL (ref 0.70–1.30)
GFR, Estimated: >60 mL/min (ref >60)
Glucose, Bld: 119 mg/dL (ref 70–140)
Potassium: 4.4 mmol/L (ref 3.5–5.1)
SODIUM: 138 mmol/L (ref 136–145)
Total Bilirubin: 0.6 mg/dL (ref 0.2–1.2)
Total Protein: 6.1 g/dL — ABNORMAL LOW (ref 6.4–8.3)

## 2017-12-06 MED ORDER — PROCHLORPERAZINE MALEATE 10 MG PO TABS
ORAL_TABLET | ORAL | Status: AC
  Filled 2017-12-06: qty 1

## 2017-12-06 MED ORDER — PACLITAXEL PROTEIN-BOUND CHEMO INJECTION 100 MG: 125.0000 mg/m2 | Freq: Once | Status: DC

## 2017-12-06 MED ORDER — SODIUM CHLORIDE 0.9 % IV SOLN
800.0000 mg/m2 | Freq: Once | INTRAVENOUS | Status: AC
  Administered 2017-12-06: 1786 mg via INTRAVENOUS
  Filled 2017-12-06: qty 46.97

## 2017-12-06 MED ORDER — HEPARIN SOD (PORK) LOCK FLUSH 100 UNIT/ML IV SOLN
500.0000 [IU] | Freq: Once | INTRAVENOUS | Status: AC | PRN
  Administered 2017-12-06: 500 [IU]
  Filled 2017-12-06: qty 5

## 2017-12-06 MED ORDER — PROCHLORPERAZINE MALEATE 10 MG PO TABS
10.0000 mg | ORAL_TABLET | Freq: Once | ORAL | Status: AC
  Administered 2017-12-06: 10 mg via ORAL

## 2017-12-06 MED ORDER — SODIUM CHLORIDE 0.9% FLUSH
10.0000 mL | INTRAVENOUS | Status: DC | PRN
  Administered 2017-12-06: 10 mL
  Filled 2017-12-06: qty 10

## 2017-12-06 MED ORDER — SODIUM CHLORIDE 0.9 % IV SOLN
Freq: Once | INTRAVENOUS | Status: AC
  Administered 2017-12-06: 11:00:00 via INTRAVENOUS

## 2017-12-06 MED ORDER — PACLITAXEL PROTEIN-BOUND CHEMO INJECTION 100 MG
100.0000 mg/m2 | Freq: Once | INTRAVENOUS | Status: AC
  Administered 2017-12-06: 225 mg via INTRAVENOUS
  Filled 2017-12-06: qty 45

## 2017-12-06 MED ORDER — SODIUM CHLORIDE 0.9 % IV SOLN: 1000.0000 mg/m2 | Freq: Once | INTRAVENOUS | Status: DC

## 2017-12-06 NOTE — Patient Instructions (Signed)
[facility] Discharge Instructions for Patients Receiving Chemotherapy  Today you received the following chemotherapy agents Abraxane, Gemzar.   To help prevent nausea and vomiting after your treatment, we encourage you to take your nausea medication compazine as prescribed.    If you develop nausea and vomiting that is not controlled by your nausea medication, call the clinic.   BELOW ARE SYMPTOMS THAT SHOULD BE REPORTED IMMEDIATELY:  *FEVER GREATER THAN 100.5 F  *CHILLS WITH OR WITHOUT FEVER  NAUSEA AND VOMITING THAT IS NOT CONTROLLED WITH YOUR NAUSEA MEDICATION  *UNUSUAL SHORTNESS OF BREATH  *UNUSUAL BRUISING OR BLEEDING  TENDERNESS IN MOUTH AND THROAT WITH OR WITHOUT PRESENCE OF ULCERS  *URINARY PROBLEMS  *BOWEL PROBLEMS  UNUSUAL RASH Items with * indicate a potential emergency and should be followed up as soon as possible.  Feel free to call the clinic should you have any questions or concerns. The clinic phone number is [phone].  Please show the CHEMO ALERT CARD at check-in to the Emergency Department and triage nurse.    Nanoparticle Albumin-Bound Paclitaxel injection (Abraxane)  What is this medicine? NANOPARTICLE ALBUMIN-BOUND PACLITAXEL (Na no PAHR ti kuhl al BYOO muhn-bound PAK li TAX el) is a chemotherapy drug. It targets fast dividing cells, like cancer cells, and causes these cells to die. This medicine is used to treat advanced breast cancer and advanced lung cancer. This medicine may be used for other purposes; ask your health care provider or pharmacist if you have questions. COMMON BRAND NAME(S): Abraxane What should I tell my health care provider before I take this medicine? They need to know if you have any of these conditions: -kidney disease -liver disease -low blood counts, like low platelets, red blood cells, or white blood cells -recent or ongoing radiation therapy -an unusual or allergic reaction to paclitaxel, albumin,  other chemotherapy, other medicines, foods, dyes, or preservatives -pregnant or trying to get pregnant -breast-feeding How should I use this medicine? This drug is given as an infusion into a vein. It is administered in a hospital or clinic by a specially trained health care professional. Talk to your pediatrician regarding the use of this medicine in children. Special care may be needed. Overdosage: If you think you have taken too much of this medicine contact a poison control center or emergency room at once. NOTE: This medicine is only for you. Do not share this medicine with others. What if I miss a dose? It is important not to miss your dose. Call your doctor or health care professional if you are unable to keep an appointment. What may interact with this medicine? -cyclosporine -diazepam -ketoconazole -medicines to increase blood counts like filgrastim, pegfilgrastim, sargramostim -other chemotherapy drugs like cisplatin, doxorubicin, epirubicin, etoposide, teniposide, vincristine -quinidine -testosterone -vaccines -verapamil Talk to your doctor or health care professional before taking any of these medicines: -acetaminophen -aspirin -ibuprofen -ketoprofen -naproxen This list may not describe all possible interactions. Give your health care provider a list of all the medicines, herbs, non-prescription drugs, or dietary supplements you use. Also tell them if you smoke, drink alcohol, or use illegal drugs. Some items may interact with your medicine. What should I watch for while using this medicine? Your condition will be monitored carefully while you are receiving this medicine. You will need important blood work done while you are taking this medicine. This medicine can cause serious allergic reactions. If you experience allergic reactions like skin rash, itching or hives, swelling of the face, lips, or   tongue, tell your doctor or health care professional right away. In some  cases, you may be given additional medicines to help with side effects. Follow all directions for their use. This drug may make you feel generally unwell. This is not uncommon, as chemotherapy can affect healthy cells as well as cancer cells. Report any side effects. Continue your course of treatment even though you feel ill unless your doctor tells you to stop. Call your doctor or health care professional for advice if you get a fever, chills or sore throat, or other symptoms of a cold or flu. Do not treat yourself. This drug decreases your body's ability to fight infections. Try to avoid being around people who are sick. This medicine may increase your risk to bruise or bleed. Call your doctor or health care professional if you notice any unusual bleeding. Be careful brushing and flossing your teeth or using a toothpick because you may get an infection or bleed more easily. If you have any dental work done, tell your dentist you are receiving this medicine. Avoid taking products that contain aspirin, acetaminophen, ibuprofen, naproxen, or ketoprofen unless instructed by your doctor. These medicines may hide a fever. Do not become pregnant while taking this medicine. Women should inform their doctor if they wish to become pregnant or think they might be pregnant. There is a potential for serious side effects to an unborn child. Talk to your health care professional or pharmacist for more information. Do not breast-feed an infant while taking this medicine. Men are advised not to father a child while receiving this medicine. What side effects may I notice from receiving this medicine? Side effects that you should report to your doctor or health care professional as soon as possible: -allergic reactions like skin rash, itching or hives, swelling of the face, lips, or tongue -low blood counts - This drug may decrease the number of white blood cells, red blood cells and platelets. You may be at increased  risk for infections and bleeding. -signs of infection - fever or chills, cough, sore throat, pain or difficulty passing urine -signs of decreased platelets or bleeding - bruising, pinpoint red spots on the skin, black, tarry stools, nosebleeds -signs of decreased red blood cells - unusually weak or tired, fainting spells, lightheadedness -breathing problems -changes in vision -chest pain -high or low blood pressure -mouth sores -nausea and vomiting -pain, swelling, redness or irritation at the injection site -pain, tingling, numbness in the hands or feet -slow or irregular heartbeat -swelling of the ankle, feet, hands Side effects that usually do not require medical attention (report to your doctor or health care professional if they continue or are bothersome): -aches, pains -changes in the color of fingernails -diarrhea -hair loss -loss of appetite This list may not describe all possible side effects. Call your doctor for medical advice about side effects. You may report side effects to FDA at 1-800-FDA-1088. Where should I keep my medicine? This drug is given in a hospital or clinic and will not be stored at home. NOTE: This sheet is a summary. It may not cover all possible information. If you have questions about this medicine, talk to your doctor, pharmacist, or health care provider.  2018 Elsevier/Gold Standard (2015-04-22 10:05:20)   Gemcitabine injection (Gemzar)  What is this medicine? GEMCITABINE (jem SIT a been) is a chemotherapy drug. This medicine is used to treat many types of cancer like breast cancer, lung cancer, pancreatic cancer, and ovarian cancer. This medicine may be   used for other purposes; ask your health care provider or pharmacist if you have questions. COMMON BRAND NAME(S): Gemzar What should I tell my health care provider before I take this medicine? They need to know if you have any of these conditions: -blood disorders -infection -kidney  disease -liver disease -recent or ongoing radiation therapy -an unusual or allergic reaction to gemcitabine, other chemotherapy, other medicines, foods, dyes, or preservatives -pregnant or trying to get pregnant -breast-feeding How should I use this medicine? This drug is given as an infusion into a vein. It is administered in a hospital or clinic by a specially trained health care professional. Talk to your pediatrician regarding the use of this medicine in children. Special care may be needed. Overdosage: If you think you have taken too much of this medicine contact a poison control center or emergency room at once. NOTE: This medicine is only for you. Do not share this medicine with others. What if I miss a dose? It is important not to miss your dose. Call your doctor or health care professional if you are unable to keep an appointment. What may interact with this medicine? -medicines to increase blood counts like filgrastim, pegfilgrastim, sargramostim -some other chemotherapy drugs like cisplatin -vaccines Talk to your doctor or health care professional before taking any of these medicines: -acetaminophen -aspirin -ibuprofen -ketoprofen -naproxen This list may not describe all possible interactions. Give your health care provider a list of all the medicines, herbs, non-prescription drugs, or dietary supplements you use. Also tell them if you smoke, drink alcohol, or use illegal drugs. Some items may interact with your medicine. What should I watch for while using this medicine? Visit your doctor for checks on your progress. This drug may make you feel generally unwell. This is not uncommon, as chemotherapy can affect healthy cells as well as cancer cells. Report any side effects. Continue your course of treatment even though you feel ill unless your doctor tells you to stop. In some cases, you may be given additional medicines to help with side effects. Follow all directions for their  use. Call your doctor or health care professional for advice if you get a fever, chills or sore throat, or other symptoms of a cold or flu. Do not treat yourself. This drug decreases your body's ability to fight infections. Try to avoid being around people who are sick. This medicine may increase your risk to bruise or bleed. Call your doctor or health care professional if you notice any unusual bleeding. Be careful brushing and flossing your teeth or using a toothpick because you may get an infection or bleed more easily. If you have any dental work done, tell your dentist you are receiving this medicine. Avoid taking products that contain aspirin, acetaminophen, ibuprofen, naproxen, or ketoprofen unless instructed by your doctor. These medicines may hide a fever. Women should inform their doctor if they wish to become pregnant or think they might be pregnant. There is a potential for serious side effects to an unborn child. Talk to your health care professional or pharmacist for more information. Do not breast-feed an infant while taking this medicine. What side effects may I notice from receiving this medicine? Side effects that you should report to your doctor or health care professional as soon as possible: -allergic reactions like skin rash, itching or hives, swelling of the face, lips, or tongue -low blood counts - this medicine may decrease the number of white blood cells, red blood cells and platelets. You   may be at increased risk for infections and bleeding. -signs of infection - fever or chills, cough, sore throat, pain or difficulty passing urine -signs of decreased platelets or bleeding - bruising, pinpoint red spots on the skin, black, tarry stools, blood in the urine -signs of decreased red blood cells - unusually weak or tired, fainting spells, lightheadedness -breathing problems -chest pain -mouth sores -nausea and vomiting -pain, swelling, redness at site where injected -pain,  tingling, numbness in the hands or feet -stomach pain -swelling of ankles, feet, hands -unusual bleeding Side effects that usually do not require medical attention (report to your doctor or health care professional if they continue or are bothersome): -constipation -diarrhea -hair loss -loss of appetite -stomach upset This list may not describe all possible side effects. Call your doctor for medical advice about side effects. You may report side effects to FDA at 1-800-FDA-1088. Where should I keep my medicine? This drug is given in a hospital or clinic and will not be stored at home. NOTE: This sheet is a summary. It may not cover all possible information. If you have questions about this medicine, talk to your doctor, pharmacist, or health care provider.  2018 Elsevier/Gold Standard (2007-10-30 18:45:54)   

## 2017-12-06 NOTE — Progress Notes (Signed)
Ok to treat with ALT of 103 and platelets of 96 per Dr. Benay Spice. Per pharmacy, no dose reduction needed, made MD aware.

## 2017-12-06 NOTE — Progress Notes (Signed)
12/06/17 @ 0915  Telephone call from Dr Benay Spice to decrease doses of Abraxane to 100 mg/m2 and Gemzar to 800 mg/m2 for all cycles.  Treatment plan modified per MD request.  T.O. Dr Darletta Moll, PharmD

## 2017-12-07 ENCOUNTER — Encounter: Payer: Self-pay | Admitting: Physician Assistant

## 2017-12-07 ENCOUNTER — Encounter (HOSPITAL_COMMUNITY): Payer: Self-pay | Admitting: *Deleted

## 2017-12-07 ENCOUNTER — Emergency Department (HOSPITAL_COMMUNITY)
Admission: EM | Admit: 2017-12-07 | Discharge: 2017-12-07 | Disposition: A | Discharge status: Home / Self Care | Payer: BLUE CROSS/BLUE SHIELD | Attending: Emergency Medicine | Admitting: Emergency Medicine

## 2017-12-07 ENCOUNTER — Ambulatory Visit (INDEPENDENT_AMBULATORY_CARE_PROVIDER_SITE_OTHER): Payer: BLUE CROSS/BLUE SHIELD | Admitting: Physician Assistant

## 2017-12-07 ENCOUNTER — Other Ambulatory Visit: Payer: Self-pay

## 2017-12-07 VITALS — BP 132/72 | HR 84 | Temp 98.6°F | Resp 16 | Ht 75.75 in | Wt 217.0 lb

## 2017-12-07 DIAGNOSIS — Z0000 Encounter for general adult medical examination without abnormal findings: Secondary | ICD-10-CM

## 2017-12-07 DIAGNOSIS — N400 Enlarged prostate without lower urinary tract symptoms: Secondary | ICD-10-CM | POA: Diagnosis not present

## 2017-12-07 DIAGNOSIS — C250 Malignant neoplasm of head of pancreas: Secondary | ICD-10-CM | POA: Diagnosis not present

## 2017-12-07 DIAGNOSIS — N23 Unspecified renal colic: Secondary | ICD-10-CM | POA: Diagnosis not present

## 2017-12-07 DIAGNOSIS — Z79899 Other long term (current) drug therapy: Secondary | ICD-10-CM | POA: Insufficient documentation

## 2017-12-07 DIAGNOSIS — I10 Essential (primary) hypertension: Secondary | ICD-10-CM | POA: Insufficient documentation

## 2017-12-07 DIAGNOSIS — R109 Unspecified abdominal pain: Secondary | ICD-10-CM | POA: Diagnosis not present

## 2017-12-07 DIAGNOSIS — R39198 Other difficulties with micturition: Secondary | ICD-10-CM | POA: Diagnosis not present

## 2017-12-07 DIAGNOSIS — N3943 Post-void dribbling: Secondary | ICD-10-CM | POA: Diagnosis not present

## 2017-12-07 DIAGNOSIS — Z131 Encounter for screening for diabetes mellitus: Secondary | ICD-10-CM | POA: Diagnosis not present

## 2017-12-07 DIAGNOSIS — E7800 Pure hypercholesterolemia, unspecified: Secondary | ICD-10-CM

## 2017-12-07 DIAGNOSIS — L853 Xerosis cutis: Secondary | ICD-10-CM | POA: Diagnosis not present

## 2017-12-07 DIAGNOSIS — R1032 Left lower quadrant pain: Secondary | ICD-10-CM | POA: Diagnosis present

## 2017-12-07 LAB — CBC WITH DIFFERENTIAL/PLATELET
BASOS PCT: 0 %
Basophils Absolute: 0 10*3/uL (ref 0.0–0.1)
EOS ABS: 0 10*3/uL (ref 0.0–0.7)
Eosinophils Relative: 1 %
HCT: 37.8 % — ABNORMAL LOW (ref 39.0–52.0)
HEMOGLOBIN: 13 g/dL (ref 13.0–17.0)
Lymphocytes Relative: 7 %
Lymphs Abs: 0.5 10*3/uL — ABNORMAL LOW (ref 0.7–4.0)
MCH: 30 pg (ref 26.0–34.0)
MCHC: 34.4 g/dL (ref 30.0–36.0)
MCV: 87.1 fL (ref 78.0–100.0)
MONOS PCT: 5 %
Monocytes Absolute: 0.4 10*3/uL (ref 0.1–1.0)
NEUTROS PCT: 87 %
Neutro Abs: 6.9 10*3/uL (ref 1.7–7.7)
Platelets: 107 10*3/uL — ABNORMAL LOW (ref 150–400)
RBC: 4.34 MIL/uL (ref 4.22–5.81)
RDW: 15 % (ref 11.5–15.5)
WBC: 7.9 10*3/uL (ref 4.0–10.5)

## 2017-12-07 LAB — COMPREHENSIVE METABOLIC PANEL
ALBUMIN: 3.4 g/dL — AB (ref 3.5–5.0)
ALK PHOS: 218 U/L — AB (ref 38–126)
ALT: 102 U/L — ABNORMAL HIGH (ref 17–63)
ANION GAP: 8 (ref 5–15)
AST: 59 U/L — ABNORMAL HIGH (ref 15–41)
BUN: 13 mg/dL (ref 6–20)
CHLORIDE: 105 mmol/L (ref 101–111)
CO2: 25 mmol/L (ref 22–32)
Calcium: 8.4 mg/dL — ABNORMAL LOW (ref 8.9–10.3)
Creatinine, Ser: 0.83 mg/dL (ref 0.61–1.24)
GFR calc Af Amer: >60 mL/min (ref >60)
GFR calc non Af Amer: >60 mL/min (ref >60)
Glucose, Bld: 131 mg/dL — ABNORMAL HIGH (ref 65–99)
POTASSIUM: 4.1 mmol/L (ref 3.5–5.1)
SODIUM: 138 mmol/L (ref 135–145)
Total Bilirubin: 1.6 mg/dL — ABNORMAL HIGH (ref 0.3–1.2)
Total Protein: 6 g/dL — ABNORMAL LOW (ref 6.5–8.1)

## 2017-12-07 LAB — URINALYSIS, ROUTINE W REFLEX MICROSCOPIC
BACTERIA UA: NONE SEEN
BILIRUBIN URINE: NEGATIVE
Glucose, UA: 50 mg/dL — AB
KETONES UR: NEGATIVE mg/dL
LEUKOCYTES UA: NEGATIVE
Nitrite: NEGATIVE
Protein, ur: NEGATIVE mg/dL
RBC / HPF: >50 RBC/hpf — ABNORMAL HIGH (ref 0–5)
Specific Gravity, Urine: 1.013 (ref 1.005–1.030)
pH: 6 (ref 5.0–8.0)

## 2017-12-07 LAB — CANCER ANTIGEN 19-9: CAN 19-9: 67 U/mL — AB (ref 0–35)

## 2017-12-07 MED ORDER — TRIAMCINOLONE ACETONIDE 0.5 % EX CREA: 1.0000 "application " | TOPICAL_CREAM | Freq: Two times a day (BID) | CUTANEOUS | 0 refills | Status: DC

## 2017-12-07 MED ORDER — IBUPROFEN 600 MG PO TABS: 600.0000 mg | ORAL_TABLET | Freq: Four times a day (QID) | ORAL | 0 refills | Status: DC | PRN

## 2017-12-07 MED ORDER — KETOROLAC TROMETHAMINE 15 MG/ML IJ SOLN
15.0000 mg | Freq: Once | INTRAMUSCULAR | Status: AC
  Administered 2017-12-07: 15 mg via INTRAVENOUS
  Filled 2017-12-07: qty 1

## 2017-12-07 MED ORDER — TAMSULOSIN HCL 0.4 MG PO CAPS: 0.4000 mg | ORAL_CAPSULE | Freq: Every day | ORAL | 0 refills | Status: AC

## 2017-12-07 NOTE — Patient Instructions (Addendum)
Use the cream to help the dry skin - it is also ok to use vaseline or aquaphor to help with dry skin  Use the flomax daily to see if we can decrease nighttime urination --  Monitor BP at home - sitting with both feet on the floor and back against a chair     IF you received an x-ray today, you will receive an invoice from [facility]. Please contact [facility] at [phone] with questions or concerns regarding your invoice.   IF you received labwork today, you will receive an invoice from [facility]. Please contact [facility] at [phone] with questions or concerns regarding your invoice.   Our billing staff will not be able to assist you with questions regarding bills from these companies.  You will be contacted with the lab results as soon as they are available. The fastest way to get your results is to activate your My Chart account. Instructions are located on the last page of this paperwork. If you have not heard from [place] regarding the results in 2 weeks, please contact this office.

## 2017-12-07 NOTE — Progress Notes (Signed)
Cory Kelly  MRN: [id number] DOB: 05-May-1956  PCP: Mancel Bale, PA-C   Chief Complaint  Patient presents with  . Annual Exam    Subjective:  Pt presents to clinic for a CPE.  Back pain for a week in the middle then this am early left side he had intense pain which caused him to go to the ED where he was diagnosed with likely kidney stones due to hematuria.  He was given pain medicine he currently feels much better.- he has had a kidney stone in the past - he had a stent placed but it did not help -   About mid jan he started to notice increase in nighttime urination - he is having urine leaking, dribbling at end of stream (he feels like he is finished he puts his close back on and then notices that his clothes are wet)- no change in urine stream or splitting he was given Flomax but his understanding of this medication was that it would make him go to the bathroom more -something that he did not desire so he never started it  New chemo started yesterday -- every 2 weeks for 12 weeks - then will go back to [facility] for scan analysis for tumor size he has some paresthesias resulting from the last chemotherapy unsure if they will improve -has plans to follow-up with [facility] after this chemotherapy round to see if he is a surgical candidate for tumor removal.  Dealing mentally okay with his recent diagnosis of pancreatic cancer.  Good family support  Last dental exam: been a while Last vision exam: every once in while  Last colonoscopy: 2015 - non cancerous polyps removed Vaccinations - UTD   Typical meals for patient: 2-3 meals a day - current increase in caloric intake to help with weight gain Typical beverage choices: water  Exercises: walking 2-3 miles every other day Sleeps: sleeping about 6-7 hours - getting up about every 2 hours to urinate - he has been given flomax but he has not taken it   Patient Active Problem List   Diagnosis Date Noted  . Port-A-Cath in place 08/31/2017    . 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[place]) 08/21/2017  . Abnormal pancreas function test   . Pancreatic abnormality   . 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 Elevated LFTs 08/04/2017  . 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 Elevated serum creatinine 11/20/2012  . Elevated PSA 03/05/2012  . Essential hypertension 11/02/2011  . Hyperlipemia 11/02/2011    Patient Care Team: Mittie Bodo as PCP - General (Physician Assistant)  Review of Systems  Constitutional: Negative.   HENT: Negative.   Eyes: Negative.   Respiratory: Negative.   Cardiovascular: Negative.   Gastrointestinal: Negative.   Endocrine: Negative.   Genitourinary: Positive for frequency and urgency. Negative for decreased urine volume.  Musculoskeletal: Negative.   Skin: Negative.   Allergic/Immunologic: Negative.   Neurological: Negative.   Hematological: Negative.   Psychiatric/Behavioral: Positive for sleep disturbance (Awakening 3-4 times at night due to urination).     Current Outpatient Medications on File Prior to Visit  Medication Sig Dispense Refill  . traMADol (ULTRAM) 50 MG tablet Take 1 tablet (50 mg total) by mouth every 8 (eight) hours as needed. 60 tablet 0  . cyclobenzaprine (FLEXERIL) 5 MG tablet Take 1 tablet (5 mg total) by mouth every 12 (twelve) hours as needed for muscle spasms. (Patient not taking: Reported on 12/07/2017) 30 tablet 0  . lidocaine-prilocaine (EMLA)  cream Apply 1 application topically as needed. Apply to portacath site 1-2 hours prior to use 30 g 2  . losartan (COZAAR) 50 MG tablet TAKE 1 TABLET BY MOUTH DAILY (Patient not taking: Reported on 12/07/2017) 30 tablet 0  . prochlorperazine (COMPAZINE) 10 MG tablet Take 1 tablet (10 mg total) by mouth every 6 (six) hours as needed for nausea or vomiting. (Patient not taking: Reported on 12/07/2017) 30 tablet 2  . tamsulosin (FLOMAX) 0.4 MG CAPS capsule Take 1 capsule (0.4 mg total) by mouth daily. (Patient not taking: Reported on  12/07/2017) 90 capsule 0   No current facility-administered medications on file prior to visit.     No Known Allergies  Social History   Socioeconomic History  . Marital status: Married    Spouse name: Not on file  . Number of children: Not on file  . Years of education: Not on file  . Highest education level: Not on file  Occupational History    Employer: [employer]  . Financial resource strain: Not on file  . Food insecurity:    Worry: Not on file    Inability: Not on file  . Transportation needs:    Medical: Not on file    Non-medical: Not on file  Tobacco Use  . Smoking status: Never Smoker  . Smokeless tobacco: Never Used  Substance and Sexual Activity  . Alcohol use: No  . Drug use: No  . Sexual activity: Yes    Birth control/protection: None  Lifestyle  . Physical activity:    Days per week: Not on file    Minutes per session: Not on file  . Stress: Not on file  Relationships  . Social connections:    Talks on phone: Not on file    Gets together: Not on file    Attends religious service: Not on file    Active member of club or organization: Not on file    Attends meetings of clubs or organizations: Not on file    Relationship status: Not on file  Other Topics Concern  . Not on file  Social History Narrative   Married   2 children, 2 grand children   Coaches womens basketball at [employer]    Past Surgical History:  Procedure Laterality Date  . APPENDECTOMY    . ENDOSCOPIC RETROGRADE CHOLANGIOPANCREATOGRAPHY (ERCP) WITH PROPOFOL N/A 08/10/2017   Procedure: ENDOSCOPIC RETROGRADE CHOLANGIOPANCREATOGRAPHY (ERCP) WITH PROPOFOL;  Surgeon: Milus Banister, MD;  Location: [facility];  Service: Endoscopy;  Laterality: N/A;  . EUS N/A 08/10/2017   Procedure: UPPER ENDOSCOPIC ULTRASOUND (EUS) RADIAL;  Surgeon: Milus Banister, MD;  Location: [facility];  Service: Endoscopy;  Laterality: N/A;  . EYE SURGERY     lasik/left eye  .  IR FLUORO GUIDE PORT INSERTION RIGHT  08/30/2017  . IR US GUIDE VASC ACCESS RIGHT  08/30/2017  . SKIN CANCER EXCISION      Family History  Problem Relation Age of Onset  . Hyperlipidemia Father   . Heart disease Father   . Dementia Father   . Memory loss Mother      Objective:  BP 132/72   Pulse 84   Temp 98.6 F (37 C)   Resp 16   Ht 6' 3.75" (1.924 m)   Wt 217 lb (98.4 kg)   SpO2 95%   BMI 26.59 kg/m   Physical Exam  Constitutional: He is oriented to person, place, and time. He appears well-developed and  well-nourished.  HENT:  Head: Normocephalic and atraumatic.  Right Ear: Hearing, tympanic membrane, external ear and ear canal normal.  Left Ear: Hearing, tympanic membrane, external ear and ear canal normal.  Nose: Nose normal.  Mouth/Throat: Uvula is midline, oropharynx is clear and moist and mucous membranes are normal.  Eyes: Pupils are equal, round, and reactive to light. Conjunctivae and EOM are normal.  Neck: Trachea normal and normal range of motion. Neck supple. No thyroid mass and no thyromegaly present.  Cardiovascular: Normal rate, regular rhythm and normal heart sounds.  No murmur heard. Pulmonary/Chest: Effort normal and breath sounds normal.  Abdominal: Soft. Bowel sounds are normal.  Genitourinary: Rectum normal. Prostate is enlarged. Prostate is not tender. Circumcised.  Genitourinary Comments: Around anus and on gluteal fold dry scaling skin  Musculoskeletal: Normal range of motion.  Neurological: He is alert and oriented to person, place, and time.  Skin: Skin is warm and dry.  Psychiatric: Judgment normal.  Vitals reviewed.   Wt Readings from Last 3 Encounters:  12/07/17 217 lb (98.4 kg)  12/07/17 215 lb (97.5 kg)  11/30/17 216 lb 1.6 oz (98 kg)     Visual Acuity Screening   Right eye Left eye Both eyes  Without correction: 20/200 20/20-2 20/20  With correction:      lasix in the left eye, right eye for close vision  Assessment and  Plan :  Annual physical exam  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[place]) - .  Continue care with oncologist at [facility] and [facility].    Essential hypertension -patient has had a few elevated blood pressure readings especially at chemotherapy but at today's visit I rechecked it and it was within normal range.  We will keep his medications the same at this point.  His wife is an Therapist, sports and was encouraged to check his blood pressures over the weekend and into the next week and let me know what his readings are.  He was instructed to check his blood pressure sitting both feet flat on the floor and back against a for the most accurate reading chair   Pure hypercholesterolemia - Plan: Lipid panel  Screening for diabetes mellitus - Plan: Hemoglobin A1c  Subjective change in urination - Plan: PSA - check levels - they were normal last year - will also look at change in level  Dry skin - Plan: triamcinolone cream (KENALOG) 0.5 % - likely to result of diarrhea from chemo - change to wet wipes and add some topical steroid to help with symptoms  Enlarged prostate - encouraged flomax use - for now to help with kidney stone and in the future to help with prostate enlargement  Dribbling of urine  Windell Hummingbird PA-C  [facility] 12/08/2017 5:04 PM

## 2017-12-07 NOTE — ED Provider Notes (Signed)
[facility] DEPT Provider Note  CSN: [id number] Arrival date & time: 12/07/17 0413  Chief Complaint(s) Flank Pain (x 3 days)  HPI Cory Kelly is a 62 y.o. male   The history is provided by the patient.  Flank Pain  This is a recurrent (reminisant of kidney stones) problem. The current episode started 2 days ago. The problem occurs constantly. Progression since onset: fluctuating; got worse tonight. Pertinent negatives include no chest pain, no abdominal pain, no headaches and no shortness of breath. Nothing aggravates the symptoms. The symptoms are relieved by medications. Treatments tried: tramadol. The treatment provided mild relief.   H/o pancreatic cancer on chemo.  Past Medical History Past Medical History:  Diagnosis Date  . Abdominal pain    due to bloating  . Bloating   . Cancer ([place])    skin cancer  . Essential hypertension   . Fatigue   . History of weight loss   . HOH (hard of hearing)    no hearing aids  . Stress    loss of father in Dec 2018.   Patient Active Problem List   Diagnosis Date Noted  . Port-A-Cath in place 08/31/2017  . 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[place]) 08/21/2017  . Abnormal pancreas function test   . Pancreatic abnormality   . 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 Elevated LFTs 08/04/2017  . 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 Elevated serum creatinine 11/20/2012  . Elevated PSA 03/05/2012  . Essential hypertension 11/02/2011  . Hyperlipemia 11/02/2011   Home Medication(s) Prior to Admission medications   Medication Sig Start Date End Date Taking? Authorizing Provider  cyclobenzaprine (FLEXERIL) 5 MG tablet Take 1 tablet (5 mg total) by mouth every 12 (twelve) hours as needed for muscle spasms. 10/05/17   Ladell Pier, MD  ibuprofen (ADVIL,MOTRIN) 600 MG tablet Take 1 tablet (600 mg total) by mouth every 6 (six) hours as needed. 12/07/17   Fatima Blank, MD  lidocaine-prilocaine (EMLA)  cream Apply 1 application topically as needed. Apply to portacath site 1-2 hours prior to use 08/21/17   Owens Shark, NP  losartan (COZAAR) 50 MG tablet TAKE 1 TABLET BY MOUTH DAILY 11/21/17   Gale Journey, Damaris Hippo, PA-C  potassium chloride (MICRO-K) 10 MEQ CR capsule Take 20 meq (2 tabs) in the morning and 10 meq (1 tab) in the evening. 11/30/17   Owens Shark, NP  prochlorperazine (COMPAZINE) 10 MG tablet Take 1 tablet (10 mg total) by mouth every 6 (six) hours as needed for nausea or vomiting. 08/21/17   Owens Shark, NP  tamsulosin (FLOMAX) 0.4 MG CAPS capsule Take 1 capsule (0.4 mg total) by mouth daily. 11/21/17   Ladell Pier, MD  tamsulosin (FLOMAX) 0.4 MG CAPS capsule Take 1 capsule (0.4 mg total) by mouth daily for 7 days. 12/07/17 12/14/17  Fatima Blank, MD  traMADol (ULTRAM) 50 MG tablet Take 1 tablet (50 mg total) by mouth every 8 (eight) hours as needed. 09/28/17   Ladell Pier, MD  Past Surgical History Past Surgical History:  Procedure Laterality Date  . APPENDECTOMY    . ENDOSCOPIC RETROGRADE CHOLANGIOPANCREATOGRAPHY (ERCP) WITH PROPOFOL N/A 08/10/2017   Procedure: ENDOSCOPIC RETROGRADE CHOLANGIOPANCREATOGRAPHY (ERCP) WITH PROPOFOL;  Surgeon: Milus Banister, MD;  [facility]: [facility];  Service: Endoscopy;  Laterality: N/A;  . EUS N/A 08/10/2017   Procedure: UPPER ENDOSCOPIC ULTRASOUND (EUS) RADIAL;  Surgeon: Milus Banister, MD;  Location: [facility];  Service: Endoscopy;  Laterality: N/A;  . EYE SURGERY     lasik/left eye  . IR FLUORO GUIDE PORT INSERTION RIGHT  08/30/2017  . IR US GUIDE VASC ACCESS RIGHT  08/30/2017  . SKIN CANCER EXCISION     Family History Family History  Problem Relation Age of Onset  . Hyperlipidemia Father   . Heart disease Father   . Dementia Father   . Memory loss Mother     Social History Social History     Tobacco Use  . Smoking status: Never Smoker  . Smokeless tobacco: Never Used  Substance Use Topics  . Alcohol use: No  . Drug use: No   Allergies Patient has no known allergies.  Review of Systems Review of Systems  Respiratory: Negative for shortness of breath.   Cardiovascular: Negative for chest pain.  Gastrointestinal: Negative for abdominal pain.  Genitourinary: Positive for flank pain and frequency. Negative for dysuria.  Neurological: Negative for headaches.   All other systems are reviewed and are negative for acute change except as noted in the HPI  Physical Exam Vital Signs  I have reviewed the triage vital signs BP (!) 171/96 (BP Location: Left Arm)   Pulse 67   Temp 97.8 F (36.6 C) (Oral)   Resp 15   Ht 6\' 4"  (1.93 m)   Wt 97.5 kg (215 lb)   SpO2 98%   BMI 26.17 kg/m   Physical Exam  Constitutional: He is oriented to person, place, and time. He appears well-developed and well-nourished. No distress.  HENT:  Head: Normocephalic and atraumatic.  Right Ear: External ear normal.  Left Ear: External ear normal.  Nose: Nose normal.  Mouth/Throat: Mucous membranes are normal. No trismus in the jaw.  Eyes: Conjunctivae and EOM are normal. No scleral icterus.  Neck: Normal range of motion and phonation normal.  Cardiovascular: Normal rate and regular rhythm.  Pulmonary/Chest: Effort normal. No stridor. No respiratory distress.  Abdominal: He exhibits no distension. There is no tenderness. There is CVA tenderness (left). There is no rigidity and no rebound.  Musculoskeletal: Normal range of motion. He exhibits no edema.  Neurological: He is alert and oriented to person, place, and time.  Skin: He is not diaphoretic.  Psychiatric: He has a normal mood and affect. His behavior is normal.  Vitals reviewed.   ED Results and Treatments Labs (all labs ordered are listed, but only abnormal results are displayed) Labs Reviewed  URINALYSIS, ROUTINE W REFLEX  MICROSCOPIC - Abnormal; Notable for the following components:      Result Value   Glucose, UA 50 (*)    Hgb urine dipstick LARGE (*)    RBC / HPF >50 (*)    All other components within normal limits  CBC WITH DIFFERENTIAL/PLATELET - Abnormal; Notable for the following components:   HCT 37.8 (*)    Platelets 107 (*)    Lymphs Abs 0.5 (*)    All other components within normal limits  COMPREHENSIVE METABOLIC PANEL - Abnormal; Notable for the following components:   Glucose, Bld 131 (*)  Calcium 8.4 (*)    Total Protein 6.0 (*)    Albumin 3.4 (*)    AST 59 (*)    ALT 102 (*)    Alkaline Phosphatase 218 (*)    Total Bilirubin 1.6 (*)    All other components within normal limits                                                                                                                         EKG  EKG Interpretation  Date/Time:    Ventricular Rate:    PR Interval:    QRS Duration:   QT Interval:    QTC Calculation:   R Axis:     Text Interpretation:        Radiology No results found. Pertinent labs & imaging results that were available during my care of the patient were reviewed by me and considered in my medical decision making (see chart for details).  Medications Ordered in ED Medications  ketorolac (TORADOL) 15 MG/ML injection 15 mg (15 mg Intravenous Given 12/07/17 0459)                                                                                                                                    Procedures Procedures EMERGENCY DEPARTMENT US RENAL EXAM  "Study: Limited Retroperitoneal Ultrasound of Kidneys"  INDICATIONS: Flank pain Long and short axis of both kidneys were obtained.   PERFORMED BY: Myself IMAGES ARCHIVED?: Yes LIMITATIONS: None VIEWS USED: Long axis and Short axis  INTERPRETATION: Left  Hydronephrosis mild    (including critical care time)  Medical Decision Making / ED Course I have reviewed the nursing notes for this encounter and  the patient's prior records (if available in EHR or on provided paperwork).  Record review noted CT abd/pel from [facility] showing multiple bilateral nonobstructing renal stone. No bone mets.  Performed on 11/29/2017.    Left flank pain.  Known renal stones.  Presentation most suspicious for renal colic.  As above no evidence of bone mets on recent CAT scan.  Bedside ultrasound confirming mild hydronephrosis.  UA with hematuria.  No evidence of infection.  Labs reassuring.  Treated with IV Toradol resulting in complete resolution of his pain.   Doubt other serious intra-abdominal inflammatory/infectious process.  The patient appears reasonably screened and/or stabilized for discharge and I doubt any other medical condition or other [facility] requiring further screening,  evaluation, or treatment in the ED at this time prior to discharge.  The patient is safe for discharge with strict return precautions.   Final Clinical Impression(s) / ED Diagnoses Final diagnoses:  Ureteral colic   Disposition: Discharge  Condition: Good  I have discussed the results, Dx and Tx plan with the patient and wife who expressed understanding and agree(s) with the plan. Discharge instructions discussed at great length. The patient and wife were given strict return precautions who verbalized understanding of the instructions. No further questions at time of discharge.    ED Discharge Orders        Ordered    tamsulosin (FLOMAX) 0.4 MG CAPS capsule  Daily     12/07/17 0633    ibuprofen (ADVIL,MOTRIN) 600 MG tablet  Every 6 hours PRN     12/07/17 4388       Follow Up: Urology  Schedule an appointment as soon as possible for a visit  As needed      This chart was dictated using voice recognition software.  Despite best efforts to proofread,  errors can occur which can change the documentation meaning.   Fatima Blank, MD 12/07/17 [phone]

## 2017-12-07 NOTE — ED Triage Notes (Signed)
Pt c/o left side back pain x 3 days, Hx of kidney stones, currently doing chemo for pancreatic cancer.

## 2017-12-08 ENCOUNTER — Other Ambulatory Visit: Payer: Self-pay | Admitting: Emergency Medicine

## 2017-12-08 ENCOUNTER — Encounter: Payer: Self-pay | Admitting: Physician Assistant

## 2017-12-08 DIAGNOSIS — C250 Malignant neoplasm of head of pancreas: Secondary | ICD-10-CM

## 2017-12-08 LAB — HEMOGLOBIN A1C
Est. average glucose Bld gHb Est-mCnc: 97 mg/dL
HEMOGLOBIN A1C: 5 % (ref 4.8–5.6)

## 2017-12-08 LAB — LIPID PANEL
CHOL/HDL RATIO: 2.9 ratio (ref 0.0–5.0)
CHOLESTEROL TOTAL: 188 mg/dL (ref 100–199)
HDL: 65 mg/dL (ref >39)
LDL Calculated: 104 mg/dL — ABNORMAL HIGH (ref 0–99)
Triglycerides: 94 mg/dL (ref 0–149)
VLDL Cholesterol Cal: 19 mg/dL (ref 5–40)

## 2017-12-08 LAB — PSA: PROSTATE SPECIFIC AG, SERUM: 3.5 ng/mL (ref 0.0–4.0)

## 2017-12-08 MED ORDER — POTASSIUM CHLORIDE ER 10 MEQ PO CPCR: ORAL_CAPSULE | ORAL | 0 refills | Status: DC

## 2017-12-11 ENCOUNTER — Other Ambulatory Visit: Payer: Self-pay | Admitting: *Deleted

## 2017-12-11 ENCOUNTER — Encounter: Payer: Self-pay | Admitting: Physician Assistant

## 2017-12-11 ENCOUNTER — Ambulatory Visit (HOSPITAL_COMMUNITY): Payer: BLUE CROSS/BLUE SHIELD

## 2017-12-11 ENCOUNTER — Telehealth: Payer: Self-pay | Admitting: Physician Assistant

## 2017-12-11 DIAGNOSIS — R311 Benign essential microscopic hematuria: Secondary | ICD-10-CM

## 2017-12-11 DIAGNOSIS — R109 Unspecified abdominal pain: Secondary | ICD-10-CM

## 2017-12-11 DIAGNOSIS — Z87442 Personal history of urinary calculi: Secondary | ICD-10-CM

## 2017-12-11 NOTE — Telephone Encounter (Signed)
Tried to get auth for pt to get the stat CT abd and pelvis wo contrast it was denied through the insurance because no u/s was done, spoke with a nurse and it was still denied a peer to peer can be done to see if this can get approved pt ID# [id number] pt DOB: 06/03/1956 A[employer] phone number [phone].

## 2017-12-12 ENCOUNTER — Telehealth: Payer: Self-pay | Admitting: Physician Assistant

## 2017-12-12 NOTE — Telephone Encounter (Signed)
Pt has been approved by [facility] for CT Abdomen and Pelvis W/O Contrast. Auth number is [id number] and is effective from 12/12/17-01/10/18. Pt is scheduled at [facility] 12/13/17 at 2pm. He is not to have anything to eat or drink 4 hrs prior to exam. Pt has been advised of appt and instructions. Thank you!!

## 2017-12-12 NOTE — Telephone Encounter (Signed)
I will change to renal US - look for kidney stones.

## 2017-12-13 ENCOUNTER — Ambulatory Visit (HOSPITAL_COMMUNITY)
Admission: RE | Admit: 2017-12-13 | Discharge: 2017-12-13 | Disposition: A | Discharge status: Home / Self Care | Payer: BLUE CROSS/BLUE SHIELD | Source: Ambulatory Visit | Attending: Physician Assistant | Admitting: Physician Assistant

## 2017-12-13 DIAGNOSIS — R109 Unspecified abdominal pain: Secondary | ICD-10-CM | POA: Insufficient documentation

## 2017-12-13 DIAGNOSIS — N132 Hydronephrosis with renal and ureteral calculous obstruction: Secondary | ICD-10-CM | POA: Diagnosis not present

## 2017-12-13 DIAGNOSIS — R161 Splenomegaly, not elsewhere classified: Secondary | ICD-10-CM | POA: Insufficient documentation

## 2017-12-15 ENCOUNTER — Inpatient Hospital Stay: Payer: BLUE CROSS/BLUE SHIELD

## 2017-12-15 ENCOUNTER — Other Ambulatory Visit: Payer: Self-pay | Admitting: *Deleted

## 2017-12-15 ENCOUNTER — Encounter: Payer: Self-pay | Admitting: Nurse Practitioner

## 2017-12-15 ENCOUNTER — Telehealth: Payer: Self-pay | Admitting: *Deleted

## 2017-12-15 DIAGNOSIS — Z5111 Encounter for antineoplastic chemotherapy: Secondary | ICD-10-CM | POA: Diagnosis not present

## 2017-12-15 DIAGNOSIS — C250 Malignant neoplasm of head of pancreas: Secondary | ICD-10-CM

## 2017-12-15 LAB — CBC WITH DIFFERENTIAL (CANCER CENTER ONLY)
BASOS ABS: 0 10*3/uL (ref 0.0–0.1)
BASOS PCT: 1 %
EOS PCT: 2 %
Eosinophils Absolute: 0 10*3/uL (ref 0.0–0.5)
HCT: 33.7 % — ABNORMAL LOW (ref 38.4–49.9)
Hemoglobin: 11.3 g/dL — ABNORMAL LOW (ref 13.0–17.1)
Lymphocytes Relative: 17 %
Lymphs Abs: 0.5 10*3/uL — ABNORMAL LOW (ref 0.9–3.3)
MCH: 29.7 pg (ref 27.2–33.4)
MCHC: 33.6 g/dL (ref 32.0–36.0)
MCV: 88.4 fL (ref 79.3–98.0)
MONO ABS: 0.3 10*3/uL (ref 0.1–0.9)
Monocytes Relative: 9 %
NEUTROS ABS: 2.1 10*3/uL (ref 1.5–6.5)
Neutrophils Relative %: 71 %
PLATELETS: 52 10*3/uL — AB (ref 140–400)
RBC: 3.81 MIL/uL — AB (ref 4.20–5.82)
RDW: 15.4 % — AB (ref 11.0–14.6)
WBC: 2.9 10*3/uL — AB (ref 4.0–10.3)

## 2017-12-15 LAB — CMP (CANCER CENTER ONLY)
ALBUMIN: 3.5 g/dL (ref 3.5–5.0)
ALT: 61 U/L — ABNORMAL HIGH (ref 0–55)
AST: 36 U/L — AB (ref 5–34)
Alkaline Phosphatase: 212 U/L — ABNORMAL HIGH (ref 40–150)
Anion gap: 8 (ref 3–11)
BUN: 12 mg/dL (ref 7–26)
CHLORIDE: 105 mmol/L (ref 98–109)
CO2: 29 mmol/L (ref 22–29)
Calcium: 9.1 mg/dL (ref 8.4–10.4)
Creatinine: 0.81 mg/dL (ref 0.70–1.30)
GFR, Est AFR Am: >60 mL/min (ref >60)
GFR, Estimated: >60 mL/min (ref >60)
GLUCOSE: 85 mg/dL (ref 70–140)
POTASSIUM: 4 mmol/L (ref 3.5–5.1)
Sodium: 142 mmol/L (ref 136–145)
Total Bilirubin: 0.8 mg/dL (ref 0.2–1.2)
Total Protein: 6.1 g/dL — ABNORMAL LOW (ref 6.4–8.3)

## 2017-12-15 NOTE — Telephone Encounter (Signed)
"  Cory Kelly calling for my husband.  He now has bruising to his hands.  He needs treatment for low platelets.  He needs platelets increased before next treatment.  The nurse had to call the doctor before treatment because his platelets were so low.  He's had a few bruises and lumps but yesterday he played golf and his hands are really bad.  Do we have to wait until next week?  Can his labs be checked today so we'll know what's needed next week?   He can come in before 1:00 pm or after 4:00 pm.  He has a funeral to attend today."  This nurse reviewed treatment effects on blood counts, nadir, activities to avoid with low platelet count because he'll bruise easily.

## 2017-12-16 ENCOUNTER — Telehealth: Payer: Self-pay | Admitting: Physician Assistant

## 2017-12-16 NOTE — Telephone Encounter (Signed)
[employer] has approved the CT abdomin and pelvis for DOS 12/12/17  Auth #: [id number]

## 2017-12-17 ENCOUNTER — Other Ambulatory Visit: Payer: Self-pay | Admitting: Oncology

## 2017-12-20 ENCOUNTER — Inpatient Hospital Stay: Payer: BLUE CROSS/BLUE SHIELD

## 2017-12-20 ENCOUNTER — Encounter: Payer: Self-pay | Admitting: Nurse Practitioner

## 2017-12-20 ENCOUNTER — Other Ambulatory Visit: Payer: Self-pay

## 2017-12-20 ENCOUNTER — Telehealth: Payer: Self-pay | Admitting: Oncology

## 2017-12-20 ENCOUNTER — Inpatient Hospital Stay (HOSPITAL_BASED_OUTPATIENT_CLINIC_OR_DEPARTMENT_OTHER): Payer: BLUE CROSS/BLUE SHIELD | Admitting: Nurse Practitioner

## 2017-12-20 VITALS — BP 159/89 | HR 64 | Temp 98.6°F | Resp 17 | Ht 75.75 in | Wt 225.1 lb

## 2017-12-20 DIAGNOSIS — C250 Malignant neoplasm of head of pancreas: Secondary | ICD-10-CM

## 2017-12-20 DIAGNOSIS — D72829 Elevated white blood cell count, unspecified: Secondary | ICD-10-CM | POA: Diagnosis not present

## 2017-12-20 DIAGNOSIS — Z5111 Encounter for antineoplastic chemotherapy: Secondary | ICD-10-CM | POA: Diagnosis not present

## 2017-12-20 DIAGNOSIS — I10 Essential (primary) hypertension: Secondary | ICD-10-CM

## 2017-12-20 DIAGNOSIS — Z95828 Presence of other vascular implants and grafts: Secondary | ICD-10-CM

## 2017-12-20 LAB — CBC WITH DIFFERENTIAL (CANCER CENTER ONLY)
BASOS PCT: 1 %
Basophils Absolute: 0 10*3/uL (ref 0.0–0.1)
Eosinophils Absolute: 0.1 10*3/uL (ref 0.0–0.5)
Eosinophils Relative: 2 %
HCT: 35.4 % — ABNORMAL LOW (ref 38.4–49.9)
HEMOGLOBIN: 11.8 g/dL — AB (ref 13.0–17.1)
LYMPHS ABS: 0.7 10*3/uL — AB (ref 0.9–3.3)
Lymphocytes Relative: 22 %
MCH: 29.6 pg (ref 27.2–33.4)
MCHC: 33.4 g/dL (ref 32.0–36.0)
MCV: 88.7 fL (ref 79.3–98.0)
Monocytes Absolute: 0.3 10*3/uL (ref 0.1–0.9)
Monocytes Relative: 10 %
NEUTROS PCT: 65 %
Neutro Abs: 2.1 10*3/uL (ref 1.5–6.5)
Platelet Count: 115 10*3/uL — ABNORMAL LOW (ref 140–400)
RBC: 3.99 MIL/uL — AB (ref 4.20–5.82)
RDW: 15.6 % — ABNORMAL HIGH (ref 11.0–14.6)
WBC: 3.2 10*3/uL — AB (ref 4.0–10.3)

## 2017-12-20 LAB — CMP (CANCER CENTER ONLY)
ALT: 50 U/L (ref 0–55)
AST: 32 U/L (ref 5–34)
Albumin: 3.4 g/dL — ABNORMAL LOW (ref 3.5–5.0)
Alkaline Phosphatase: 193 U/L — ABNORMAL HIGH (ref 40–150)
Anion gap: 6 (ref 3–11)
BUN: 11 mg/dL (ref 7–26)
CHLORIDE: 105 mmol/L (ref 98–109)
CO2: 28 mmol/L (ref 22–29)
CREATININE: 0.86 mg/dL (ref 0.70–1.30)
Calcium: 8.7 mg/dL (ref 8.4–10.4)
Glucose, Bld: 108 mg/dL (ref 70–140)
Potassium: 3.7 mmol/L (ref 3.5–5.1)
Sodium: 139 mmol/L (ref 136–145)
Total Bilirubin: 0.8 mg/dL (ref 0.2–1.2)
Total Protein: 5.8 g/dL — ABNORMAL LOW (ref 6.4–8.3)

## 2017-12-20 LAB — MAGNESIUM: Magnesium: 1.8 mg/dL (ref 1.7–2.4)

## 2017-12-20 MED ORDER — PROCHLORPERAZINE MALEATE 10 MG PO TABS
10.0000 mg | ORAL_TABLET | Freq: Once | ORAL | Status: AC
  Administered 2017-12-20: 10 mg via ORAL

## 2017-12-20 MED ORDER — PROCHLORPERAZINE MALEATE 10 MG PO TABS
ORAL_TABLET | ORAL | Status: AC
  Filled 2017-12-20: qty 1

## 2017-12-20 MED ORDER — SODIUM CHLORIDE 0.9 % IV SOLN
Freq: Once | INTRAVENOUS | Status: AC
  Administered 2017-12-20: 15:00:00 via INTRAVENOUS

## 2017-12-20 MED ORDER — PACLITAXEL PROTEIN-BOUND CHEMO INJECTION 100 MG
100.0000 mg/m2 | Freq: Once | Status: AC
  Administered 2017-12-20: 225 mg via INTRAVENOUS
  Filled 2017-12-20: qty 45

## 2017-12-20 MED ORDER — SODIUM CHLORIDE 0.9% FLUSH
10.0000 mL | INTRAVENOUS | Status: DC | PRN
  Administered 2017-12-20: 10 mL
  Filled 2017-12-20: qty 10

## 2017-12-20 MED ORDER — SODIUM CHLORIDE 0.9% FLUSH
10.0000 mL | Freq: Once | INTRAVENOUS | Status: AC
  Administered 2017-12-20: 10 mL
  Filled 2017-12-20: qty 10

## 2017-12-20 MED ORDER — SODIUM CHLORIDE 0.9 % IV SOLN
800.0000 mg/m2 | Freq: Once | INTRAVENOUS | Status: AC
  Administered 2017-12-20: 1786 mg via INTRAVENOUS
  Filled 2017-12-20: qty 46.97

## 2017-12-20 MED ORDER — HEPARIN SOD (PORK) LOCK FLUSH 100 UNIT/ML IV SOLN
500.0000 [IU] | Freq: Once | INTRAVENOUS | Status: AC | PRN
  Administered 2017-12-20: 500 [IU]
  Filled 2017-12-20: qty 5

## 2017-12-20 NOTE — Patient Instructions (Signed)
[facility] Discharge Instructions for Patients Receiving Chemotherapy  Today you received the following chemotherapy agents Abraxane, Gemzar.   To help prevent nausea and vomiting after your treatment, we encourage you to take your nausea medication compazine as prescribed.    If you develop nausea and vomiting that is not controlled by your nausea medication, call the clinic.   BELOW ARE SYMPTOMS THAT SHOULD BE REPORTED IMMEDIATELY:  *FEVER GREATER THAN 100.5 F  *CHILLS WITH OR WITHOUT FEVER  NAUSEA AND VOMITING THAT IS NOT CONTROLLED WITH YOUR NAUSEA MEDICATION  *UNUSUAL SHORTNESS OF BREATH  *UNUSUAL BRUISING OR BLEEDING  TENDERNESS IN MOUTH AND THROAT WITH OR WITHOUT PRESENCE OF ULCERS  *URINARY PROBLEMS  *BOWEL PROBLEMS  UNUSUAL RASH Items with * indicate a potential emergency and should be followed up as soon as possible.  Feel free to call the clinic should you have any questions or concerns. The clinic phone number is [phone].  Please show the [facility] at check-in to the Emergency Department and triage nurse.    Nanoparticle Albumin-Bound Paclitaxel injection (Abraxane)  What is this medicine? NANOPARTICLE ALBUMIN-BOUND PACLITAXEL (Na no PAHR ti kuhl al BYOO muhn-bound PAK li TAX el) is a chemotherapy drug. It targets fast dividing cells, like cancer cells, and causes these cells to die. This medicine is used to treat advanced breast cancer and advanced lung cancer. This medicine may be used for other purposes; ask your health care provider or pharmacist if you have questions. COMMON BRAND NAME(S): Abraxane What should I tell my health care provider before I take this medicine? They need to know if you have any of these conditions: -kidney disease -liver disease -low blood counts, like low platelets, red blood cells, or white blood cells -recent or ongoing radiation therapy -an unusual or allergic reaction to paclitaxel, albumin,  other chemotherapy, other medicines, foods, dyes, or preservatives -pregnant or trying to get pregnant -breast-feeding How should I use this medicine? This drug is given as an infusion into a vein. It is administered in a hospital or clinic by a specially trained health care professional. Talk to your pediatrician regarding the use of this medicine in children. Special care may be needed. Overdosage: If you think you have taken too much of this medicine contact a poison control center or emergency room at once. NOTE: This medicine is only for you. Do not share this medicine with others. What if I miss a dose? It is important not to miss your dose. Call your doctor or health care professional if you are unable to keep an appointment. What may interact with this medicine? -cyclosporine -diazepam -ketoconazole -medicines to increase blood counts like filgrastim, pegfilgrastim, sargramostim -other chemotherapy drugs like cisplatin, doxorubicin, epirubicin, etoposide, teniposide, vincristine -quinidine -testosterone -vaccines -verapamil Talk to your doctor or health care professional before taking any of these medicines: -acetaminophen -aspirin -ibuprofen -ketoprofen -naproxen This list may not describe all possible interactions. Give your health care provider a list of all the medicines, herbs, non-prescription drugs, or dietary supplements you use. Also tell them if you smoke, drink alcohol, or use illegal drugs. Some items may interact with your medicine. What should I watch for while using this medicine? Your condition will be monitored carefully while you are receiving this medicine. You will need important blood work done while you are taking this medicine. This medicine can cause serious allergic reactions. If you experience allergic reactions like skin rash, itching or hives, swelling of the face, lips, or  tongue, tell your doctor or health care professional right away. In some  cases, you may be given additional medicines to help with side effects. Follow all directions for their use. This drug may make you feel generally unwell. This is not uncommon, as chemotherapy can affect healthy cells as well as cancer cells. Report any side effects. Continue your course of treatment even though you feel ill unless your doctor tells you to stop. Call your doctor or health care professional for advice if you get a fever, chills or sore throat, or other symptoms of a cold or flu. Do not treat yourself. This drug decreases your body's ability to fight infections. Try to avoid being around people who are sick. This medicine may increase your risk to bruise or bleed. Call your doctor or health care professional if you notice any unusual bleeding. Be careful brushing and flossing your teeth or using a toothpick because you may get an infection or bleed more easily. If you have any dental work done, tell your dentist you are receiving this medicine. Avoid taking products that contain aspirin, acetaminophen, ibuprofen, naproxen, or ketoprofen unless instructed by your doctor. These medicines may hide a fever. Do not become pregnant while taking this medicine. Women should inform their doctor if they wish to become pregnant or think they might be pregnant. There is a potential for serious side effects to an unborn child. Talk to your health care professional or pharmacist for more information. Do not breast-feed an infant while taking this medicine. Men are advised not to father a child while receiving this medicine. What side effects may I notice from receiving this medicine? Side effects that you should report to your doctor or health care professional as soon as possible: -allergic reactions like skin rash, itching or hives, swelling of the face, lips, or tongue -low blood counts - This drug may decrease the number of white blood cells, red blood cells and platelets. You may be at increased  risk for infections and bleeding. -signs of infection - fever or chills, cough, sore throat, pain or difficulty passing urine -signs of decreased platelets or bleeding - bruising, pinpoint red spots on the skin, black, tarry stools, nosebleeds -signs of decreased red blood cells - unusually weak or tired, fainting spells, lightheadedness -breathing problems -changes in vision -chest pain -high or low blood pressure -mouth sores -nausea and vomiting -pain, swelling, redness or irritation at the injection site -pain, tingling, numbness in the hands or feet -slow or irregular heartbeat -swelling of the ankle, feet, hands Side effects that usually do not require medical attention (report to your doctor or health care professional if they continue or are bothersome): -aches, pains -changes in the color of fingernails -diarrhea -hair loss -loss of appetite This list may not describe all possible side effects. Call your doctor for medical advice about side effects. You may report side effects to FDA at 1-800-FDA-1088. Where should I keep my medicine? This drug is given in a hospital or clinic and will not be stored at home. NOTE: This sheet is a summary. It may not cover all possible information. If you have questions about this medicine, talk to your doctor, pharmacist, or health care provider.  2018 Elsevier/Gold Standard (2015-04-22 10:05:20)   Gemcitabine injection (Gemzar)  What is this medicine? GEMCITABINE (jem SIT a been) is a chemotherapy drug. This medicine is used to treat many types of cancer like breast cancer, lung cancer, pancreatic cancer, and ovarian cancer. This medicine may be  used for other purposes; ask your health care provider or pharmacist if you have questions. COMMON BRAND NAME(S): Gemzar What should I tell my health care provider before I take this medicine? They need to know if you have any of these conditions: -blood disorders -infection -kidney  disease -liver disease -recent or ongoing radiation therapy -an unusual or allergic reaction to gemcitabine, other chemotherapy, other medicines, foods, dyes, or preservatives -pregnant or trying to get pregnant -breast-feeding How should I use this medicine? This drug is given as an infusion into a vein. It is administered in a hospital or clinic by a specially trained health care professional. Talk to your pediatrician regarding the use of this medicine in children. Special care may be needed. Overdosage: If you think you have taken too much of this medicine contact a poison control center or emergency room at once. NOTE: This medicine is only for you. Do not share this medicine with others. What if I miss a dose? It is important not to miss your dose. Call your doctor or health care professional if you are unable to keep an appointment. What may interact with this medicine? -medicines to increase blood counts like filgrastim, pegfilgrastim, sargramostim -some other chemotherapy drugs like cisplatin -vaccines Talk to your doctor or health care professional before taking any of these medicines: -acetaminophen -aspirin -ibuprofen -ketoprofen -naproxen This list may not describe all possible interactions. Give your health care provider a list of all the medicines, herbs, non-prescription drugs, or dietary supplements you use. Also tell them if you smoke, drink alcohol, or use illegal drugs. Some items may interact with your medicine. What should I watch for while using this medicine? Visit your doctor for checks on your progress. This drug may make you feel generally unwell. This is not uncommon, as chemotherapy can affect healthy cells as well as cancer cells. Report any side effects. Continue your course of treatment even though you feel ill unless your doctor tells you to stop. In some cases, you may be given additional medicines to help with side effects. Follow all directions for their  use. Call your doctor or health care professional for advice if you get a fever, chills or sore throat, or other symptoms of a cold or flu. Do not treat yourself. This drug decreases your body's ability to fight infections. Try to avoid being around people who are sick. This medicine may increase your risk to bruise or bleed. Call your doctor or health care professional if you notice any unusual bleeding. Be careful brushing and flossing your teeth or using a toothpick because you may get an infection or bleed more easily. If you have any dental work done, tell your dentist you are receiving this medicine. Avoid taking products that contain aspirin, acetaminophen, ibuprofen, naproxen, or ketoprofen unless instructed by your doctor. These medicines may hide a fever. Women should inform their doctor if they wish to become pregnant or think they might be pregnant. There is a potential for serious side effects to an unborn child. Talk to your health care professional or pharmacist for more information. Do not breast-feed an infant while taking this medicine. What side effects may I notice from receiving this medicine? Side effects that you should report to your doctor or health care professional as soon as possible: -allergic reactions like skin rash, itching or hives, swelling of the face, lips, or tongue -low blood counts - this medicine may decrease the number of white blood cells, red blood cells and platelets. You  may be at increased risk for infections and bleeding. -signs of infection - fever or chills, cough, sore throat, pain or difficulty passing urine -signs of decreased platelets or bleeding - bruising, pinpoint red spots on the skin, black, tarry stools, blood in the urine -signs of decreased red blood cells - unusually weak or tired, fainting spells, lightheadedness -breathing problems -chest pain -mouth sores -nausea and vomiting -pain, swelling, redness at site where injected -pain,  tingling, numbness in the hands or feet -stomach pain -swelling of ankles, feet, hands -unusual bleeding Side effects that usually do not require medical attention (report to your doctor or health care professional if they continue or are bothersome): -constipation -diarrhea -hair loss -loss of appetite -stomach upset This list may not describe all possible side effects. Call your doctor for medical advice about side effects. You may report side effects to FDA at 1-800-FDA-1088. Where should I keep my medicine? This drug is given in a hospital or clinic and will not be stored at home. NOTE: This sheet is a summary. It may not cover all possible information. If you have questions about this medicine, talk to your doctor, pharmacist, or health care provider.  2018 Elsevier/Gold Standard (2007-10-30 18:45:54)

## 2017-12-20 NOTE — Telephone Encounter (Signed)
Appointments scheduled AVS/Calendar printed per 6/19 los °

## 2017-12-20 NOTE — Progress Notes (Addendum)
[facility] OFFICE PROGRESS NOTE   Diagnosis: Pancreas cancer  INTERVAL HISTORY:   Mr. Wendt returns as scheduled.  He completed cycle 1 day 1 gemcitabine/Abraxane 12/06/2017.  He contacted the office 12/15/2017 to report bruising on his hands.  CBC at that time showed a platelet count of 52,000.  The bruising over the hands has improved.  He denies any other bleeding.  He had no nausea or vomiting following the chemotherapy.  No mouth sores.  No diarrhea.  No fever.  No rash.  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  He has increased numbness in the feet.  Symptoms do not interfere with activity.  Objective:  Vital signs in last 24 hours:  Blood pressure (!) 159/89, pulse 64, temperature 98.6 F (37 C), temperature source Oral, resp. rate 17, height 6' 3.75" (1.924 m), weight 225 lb 1.6 oz (102.1 kg), SpO2 100 %.    HEENT: No thrush or ulcers. Resp: Lungs clear bilaterally. Cardio: Regular rate and rhythm. GI: Abdomen soft and nontender.  No hepatomegaly. Vascular: No leg edema. Skin: Ecchymoses scattered over the dorsal surface of the hands and forearms. Port-A-Cath without erythema.   Lab Results:  Lab Results  Component Value Date   WBC 3.2 (L) 12/20/2017   HGB 11.8 (L) 12/20/2017   HCT 35.4 (L) 12/20/2017   MCV 88.7 12/20/2017   PLT 115 (L) 12/20/2017   NEUTROABS 2.1 12/20/2017    Imaging:  No results found.  Medications: I have reviewed the patient's current medications.  Assessment/Plan: 1. Pancreas head mass  CT abdomen/pelvis 08/03/2017-fullness in the head of the pancreas with stranding in the peripancreatic fat and pancreatic ductal dilatation.   MRI abdomen 08/04/2017-findings favored to represent acute on chronic pancreatitis; equivocal soft tissue fullness within the head and uncinate process of the pancreas; indeterminate too small to characterize right hepatic lobe lesion; small volume abdominal ascites; splenomegaly.    CA-19-9 elevated at 977on 08/04/2017.  Status postupper EUS 08/10/2017-findings of an irregular masslike region in the pancreatic head measuring approximately 2.7 cm. This directly abutted the main portal vein but no other major vascular structures. Biopsies obtained with preliminary cytology positive for malignancy, likely adenocarcinoma. The final report is pending. The common bile duct and main pancreatic duct were both dilated. ERCP was then proceeded with forstent placement. Multiple attempts were made tocannulate the bile duct without success. The procedure was aborted.  08/16/2017 ERCP with placement of a metal biliary stent in the common bile duct by Dr. Francella Solian at [facility].  Cycle 1 FOLFIRINOX 08/31/2017  Cycle 2 FOLFIRINOX 09/14/2017  Cycle 3 FOLFIRINOX 09/28/2017  Cycle 4 FOLFIRINOX 10/12/2017 (oxaliplatin dose reduced secondary to thrombocytopenia)  Cycle 5 FOLFIRINOX 10/26/2017  Cycle 6 FOLFIRINOX 11/09/2017  Restaging CTs at [facility] 11/29/2017- no definitive evidence of distant metastatic disease.  2 subcentimeter liver lesions described on prior MRI not visualized on CT.  Ill-defined pancreatic head mass stable to decreased in size measuring 2.1 x 2 cm.  Peripancreatic inflammatory stranding decreased from prior.  No biliary ductal dilatation.  Celiac axis less than 180 degrees abutment.  Common hepatic artery with greater than 180 degrees encasement; superior mesenteric artery with short segment less than 180 degrees abutment; portal vein/superior mesenteric vein with greater than 180 degrees encasement of the extrahepatic portal vein with associated circumferential narrowing at the portomesenteric, overall substantially improved from prior examination.  Now less than 180 degree abutment of the SMV.  The portal vein and SMV remain patent.  Splenic  vein patent.  Cycle 1 gemcitabine/Abraxane 12/06/2017  Cycle 2 gemcitabine/Abraxane 12/20/2017 2. Hypertension 3. Port-A-Cath placement  08/30/2017 4. History of elevated bilirubin-questionGilbert's syndrome 5. Mild leukopenia, thrombocytopenia-question secondary to portal hypertension/splenomegaly;09/14/2017 white count improved, platelet count stable; progressive thrombocytopenia following cycle 1 gemcitabine/Abraxane   Disposition: Mr. Mullin appears stable.  He has completed 1 cycle of gemcitabine/Abraxane.  Plan to proceed with cycle 2 today as scheduled.  We reviewed the CBC from today.  The platelet count has improved.  The total white count is mildly decreased.  He will return for lab, follow-up and cycle 3 gemcitabine/Abraxane in 2 weeks.  He will contact the office in the interim with any problems.  Patient seen with Dr. Benay Spice.    Ned Card ANP/GNP-BC   12/20/2017  2:30 PM  This was a shared visit with Ned Card.  Mr. Sahakian complete cycle 2 gemcitabine/Abraxane today.  He understands the chance of developing increased hematologic toxicity following this cycle.  The plan is to complete 3 months of gemcitabine/Abraxane prior to a restaging evaluation.  Julieanne Manson, MD

## 2017-12-22 ENCOUNTER — Other Ambulatory Visit: Payer: Self-pay | Admitting: Physician Assistant

## 2017-12-30 ENCOUNTER — Encounter (HOSPITAL_COMMUNITY): Payer: Self-pay | Admitting: *Deleted

## 2017-12-30 ENCOUNTER — Emergency Department (HOSPITAL_COMMUNITY)
Admission: EM | Admit: 2017-12-30 | Discharge: 2017-12-30 | Disposition: A | Discharge status: Home / Self Care | Payer: BLUE CROSS/BLUE SHIELD | Attending: Emergency Medicine | Admitting: Emergency Medicine

## 2017-12-30 ENCOUNTER — Emergency Department (HOSPITAL_COMMUNITY): Payer: BLUE CROSS/BLUE SHIELD

## 2017-12-30 ENCOUNTER — Other Ambulatory Visit: Payer: Self-pay

## 2017-12-30 DIAGNOSIS — R509 Fever, unspecified: Secondary | ICD-10-CM

## 2017-12-30 DIAGNOSIS — R740 Nonspecific elevation of levels of transaminase and lactic acid dehydrogenase [LDH]: Secondary | ICD-10-CM | POA: Diagnosis not present

## 2017-12-30 DIAGNOSIS — Z85828 Personal history of other malignant neoplasm of skin: Secondary | ICD-10-CM | POA: Diagnosis not present

## 2017-12-30 DIAGNOSIS — R059 Cough, unspecified: Secondary | ICD-10-CM

## 2017-12-30 DIAGNOSIS — R05 Cough: Secondary | ICD-10-CM | POA: Insufficient documentation

## 2017-12-30 DIAGNOSIS — Z959 Presence of cardiac and vascular implant and graft, unspecified: Secondary | ICD-10-CM | POA: Diagnosis not present

## 2017-12-30 DIAGNOSIS — Z8507 Personal history of malignant neoplasm of pancreas: Secondary | ICD-10-CM | POA: Diagnosis not present

## 2017-12-30 DIAGNOSIS — R251 Tremor, unspecified: Secondary | ICD-10-CM | POA: Insufficient documentation

## 2017-12-30 DIAGNOSIS — D649 Anemia, unspecified: Secondary | ICD-10-CM

## 2017-12-30 DIAGNOSIS — R112 Nausea with vomiting, unspecified: Secondary | ICD-10-CM | POA: Diagnosis not present

## 2017-12-30 DIAGNOSIS — D696 Thrombocytopenia, unspecified: Secondary | ICD-10-CM

## 2017-12-30 DIAGNOSIS — R7401 Elevation of levels of liver transaminase levels: Secondary | ICD-10-CM

## 2017-12-30 DIAGNOSIS — Z79899 Other long term (current) drug therapy: Secondary | ICD-10-CM | POA: Diagnosis not present

## 2017-12-30 DIAGNOSIS — R3121 Asymptomatic microscopic hematuria: Secondary | ICD-10-CM

## 2017-12-30 DIAGNOSIS — R1110 Vomiting, unspecified: Secondary | ICD-10-CM | POA: Diagnosis not present

## 2017-12-30 DIAGNOSIS — R531 Weakness: Secondary | ICD-10-CM | POA: Diagnosis present

## 2017-12-30 HISTORY — DX: Neuralgia and neuritis, unspecified: M79.2

## 2017-12-30 LAB — CBC WITH DIFFERENTIAL/PLATELET
Basophils Absolute: 0 10*3/uL (ref 0.0–0.1)
Basophils Relative: 0 %
EOS PCT: 0 %
Eosinophils Absolute: 0 10*3/uL (ref 0.0–0.7)
HCT: 31.9 % — ABNORMAL LOW (ref 39.0–52.0)
Hemoglobin: 10.9 g/dL — ABNORMAL LOW (ref 13.0–17.0)
LYMPHS ABS: 0.4 10*3/uL — AB (ref 0.7–4.0)
Lymphocytes Relative: 7 %
MCH: 29.9 pg (ref 26.0–34.0)
MCHC: 34.2 g/dL (ref 30.0–36.0)
MCV: 87.4 fL (ref 78.0–100.0)
MONO ABS: 0.5 10*3/uL (ref 0.1–1.0)
Monocytes Relative: 10 %
NEUTROS ABS: 4.2 10*3/uL (ref 1.7–7.7)
Neutrophils Relative %: 83 %
PLATELETS: 41 10*3/uL — AB (ref 150–400)
RBC: 3.65 MIL/uL — ABNORMAL LOW (ref 4.22–5.81)
RDW: 14.2 % (ref 11.5–15.5)
WBC: 5.1 10*3/uL (ref 4.0–10.5)

## 2017-12-30 LAB — URINALYSIS, ROUTINE W REFLEX MICROSCOPIC
Bacteria, UA: NONE SEEN
Bilirubin Urine: NEGATIVE
GLUCOSE, UA: NEGATIVE mg/dL
KETONES UR: NEGATIVE mg/dL
LEUKOCYTES UA: NEGATIVE
NITRITE: NEGATIVE
PH: 5 (ref 5.0–8.0)
PROTEIN: NEGATIVE mg/dL
Specific Gravity, Urine: 1.023 (ref 1.005–1.030)

## 2017-12-30 LAB — COMPREHENSIVE METABOLIC PANEL
ALT: 78 U/L — ABNORMAL HIGH (ref 0–44)
ANION GAP: 8 (ref 5–15)
AST: 67 U/L — ABNORMAL HIGH (ref 15–41)
Albumin: 3.5 g/dL (ref 3.5–5.0)
Alkaline Phosphatase: 169 U/L — ABNORMAL HIGH (ref 38–126)
BUN: 10 mg/dL (ref 6–20)
CHLORIDE: 103 mmol/L (ref 98–111)
CO2: 25 mmol/L (ref 22–32)
Calcium: 8.3 mg/dL — ABNORMAL LOW (ref 8.9–10.3)
Creatinine, Ser: 0.95 mg/dL (ref 0.61–1.24)
Glucose, Bld: 107 mg/dL — ABNORMAL HIGH (ref 70–99)
POTASSIUM: 3.8 mmol/L (ref 3.5–5.1)
Sodium: 136 mmol/L (ref 135–145)
TOTAL PROTEIN: 6.2 g/dL — AB (ref 6.5–8.1)
Total Bilirubin: 1.8 mg/dL — ABNORMAL HIGH (ref 0.3–1.2)

## 2017-12-30 LAB — I-STAT CG4 LACTIC ACID, ED: Lactic Acid, Venous: 1.28 mmol/L (ref 0.5–1.9)

## 2017-12-30 LAB — LIPASE, BLOOD: LIPASE: 17 U/L (ref 11–51)

## 2017-12-30 MED ORDER — ONDANSETRON HCL 4 MG/2ML IJ SOLN: 4.0000 mg | Freq: Once | INTRAMUSCULAR | Status: DC

## 2017-12-30 MED ORDER — SODIUM CHLORIDE 0.9 % IV BOLUS
1000.0000 mL | Freq: Once | INTRAVENOUS | Status: AC
  Administered 2017-12-30: 1000 mL via INTRAVENOUS

## 2017-12-30 MED ORDER — ACETAMINOPHEN 325 MG PO TABS
650.0000 mg | ORAL_TABLET | Freq: Once | ORAL | Status: AC
  Administered 2017-12-30: 650 mg via ORAL
  Filled 2017-12-30: qty 2

## 2017-12-30 NOTE — ED Provider Notes (Signed)
[facility] DEPT Provider Note   CSN: [id number] Arrival date & time: 12/30/17  1217     History   Chief Complaint Chief Complaint  Patient presents with  . Weakness  . Trembling    HPI Cory Kelly is a 62 y.o. male with a PMHx of HTN, pancreatic cancer currently on gemcitabine/Abraxane (finished cycle 2 on 12/20/17), HLD, and other conditions listed below, with a PSHx of appendectomy, who presents to the ED with complaints of an episode of chills/shaking as well as nausea and vomiting this morning.  Patient states that he "had a bad night last night", was urinating frequently which is normal for him, but he had difficulty falling back to sleep and was overall not feeling well.  This morning around 5:30 AM he tried to take a hot shower which usually makes him feel better however during the shower he started having shaking and chills.  He has had recurrent chills/shaking intermittently throughout the day.  On his way to his mother's house he started feeling nauseated and had one episode of nonbloody nonbilious emesis.  He attempted to take Tylenol and Advil around 8:30 AM and Aleve around 11 AM which helped somewhat temporarily.  No other treatments tried.  No known aggravating factors.  He mentions that he has had a dry cough for the last 1-2 days as well.  His oncologist is Dr. Malachy Mood.  He denies any recent travel or sick contacts.  He states that his urinary frequency is unchanged from his baseline, he also mentions that he is had rhinorrhea ever since starting chemotherapy and that is also unchanged.  He denies any fevers, sore throat, ear pain or drainage, CP, SOB, changes in abdominal pain, hematemesis, diarrhea, constipation, melena, hematochezia, dysuria, hematuria, myalgias, arthralgias, numbness, tingling, focal weakness, or any other complaints at this time.  The history is provided by the patient and medical records. No language interpreter was used.     Past Medical History:  Diagnosis Date  . Abdominal pain    due to bloating  . Bloating   . Cancer ([place])    skin cancer  . Essential hypertension   . Fatigue   . History of weight loss   . HOH (hard of hearing)    no hearing aids  . Neuralgia   . Stress    loss of father in Dec 2018.    Patient Active Problem List   Diagnosis Date Noted  . Port-A-Cath in place 08/31/2017  . 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[place]) 08/21/2017  . Abnormal pancreas function test   . Pancreatic abnormality   . 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 Elevated LFTs 08/04/2017  . 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 Elevated serum creatinine 11/20/2012  . Elevated PSA 03/05/2012  . Essential hypertension 11/02/2011  . Hyperlipemia 11/02/2011    Past Surgical History:  Procedure Laterality Date  . APPENDECTOMY    . ENDOSCOPIC RETROGRADE CHOLANGIOPANCREATOGRAPHY (ERCP) WITH PROPOFOL N/A 08/10/2017   Procedure: ENDOSCOPIC RETROGRADE CHOLANGIOPANCREATOGRAPHY (ERCP) WITH PROPOFOL;  Surgeon: Milus Banister, MD;  Location: [facility] ENDOSCOPY;  Service: Endoscopy;  Laterality: N/A;  . EUS N/A 08/10/2017   Procedure: UPPER ENDOSCOPIC ULTRASOUND (EUS) RADIAL;  Surgeon: Milus Banister, MD;  Location: [facility] ENDOSCOPY;  Service: Endoscopy;  Laterality: N/A;  . EYE SURGERY     lasik/left eye  . IR FLUORO GUIDE PORT INSERTION RIGHT  08/30/2017  . IR US GUIDE VASC ACCESS RIGHT  08/30/2017  . SKIN CANCER EXCISION  Home Medications    Prior to Admission medications   Medication Sig Start Date End Date Taking? Authorizing Provider  cyclobenzaprine (FLEXERIL) 5 MG tablet Take 1 tablet (5 mg total) by mouth every 12 (twelve) hours as needed for muscle spasms. Patient not taking: Reported on 12/07/2017 10/05/17   Ladell Pier, MD  ibuprofen (ADVIL,MOTRIN) 600 MG tablet Take 1 tablet (600 mg total) by mouth every 6 (six) hours as needed. 12/07/17   Fatima Blank, MD  lidocaine-prilocaine (EMLA)  cream Apply 1 application topically as needed. Apply to portacath site 1-2 hours prior to use 08/21/17   Owens Shark, NP  losartan (COZAAR) 50 MG tablet TAKE 1 TABLET BY MOUTH DAILY 11/21/17   Gale Journey, Damaris Hippo, PA-C  potassium chloride (MICRO-K) 10 MEQ CR capsule Take 20 meq (2 tabs) in the morning and 10 meq (1 tab) in the evening. 12/08/17   Ladell Pier, MD  prochlorperazine (COMPAZINE) 10 MG tablet Take 1 tablet (10 mg total) by mouth every 6 (six) hours as needed for nausea or vomiting. 08/21/17   Owens Shark, NP  tamsulosin (FLOMAX) 0.4 MG CAPS capsule Take 1 capsule (0.4 mg total) by mouth daily. Patient not taking: Reported on 12/07/2017 11/21/17   Ladell Pier, MD  traMADol (ULTRAM) 50 MG tablet Take 1 tablet (50 mg total) by mouth every 8 (eight) hours as needed. Patient not taking: Reported on 12/20/2017 09/28/17   Ladell Pier, MD  triamcinolone cream (KENALOG) 0.5 % Apply 1 application topically 2 (two) times daily. 12/07/17   Gale Journey, Damaris Hippo, PA-C    Family History Family History  Problem Relation Age of Onset  . Hyperlipidemia Father   . Heart disease Father   . Dementia Father   . Memory loss Mother     Social History Social History   Tobacco Use  . Smoking status: Never Smoker  . Smokeless tobacco: Never Used  Substance Use Topics  . Alcohol use: No  . Drug use: No     Allergies   Patient has no known allergies.   Review of Systems Review of Systems  Constitutional: Positive for chills. Negative for fever.  HENT: Negative for ear discharge, ear pain, rhinorrhea (nothing new/different) and sore throat.   Respiratory: Positive for cough. Negative for shortness of breath.   Cardiovascular: Negative for chest pain.  Gastrointestinal: Positive for nausea and vomiting. Negative for abdominal pain, blood in stool, constipation and diarrhea.  Genitourinary: Negative for dysuria and hematuria.  Musculoskeletal: Negative for arthralgias and myalgias.  Skin:  Negative for color change.  Allergic/Immunologic: Positive for immunocompromised state (on chemo).  Neurological: Negative for weakness and numbness.  Psychiatric/Behavioral: Negative for confusion.   All other systems reviewed and are negative for acute change except as noted in the HPI.    Physical Exam Updated Vital Signs BP (!) 150/82 (BP Location: Left Arm)   Pulse 81   Temp 99.3 F (37.4 C) (Oral)   Resp 16   Ht 6' 3.75" (1.924 m)   Wt 99.8 kg (220 lb)   SpO2 98%   BMI 26.96 kg/m  Exam VS: BP 141/80   Pulse 80   Temp (!) 100.5 F (38.1 C)   Resp 16   Ht 6' 3.75" (1.924 m)   Wt 99.8 kg (220 lb)   SpO2 95%   BMI 26.96 kg/m    Physical Exam  Constitutional: He is oriented to person, place, and time. Vital signs are normal. He  appears well-developed and well-nourished.  Non-toxic appearance. No distress.  Felt warm to touch during exam, temp taken and was 100.42F orally, nontoxic, NAD, VSS otherwise, and appears overall well  HENT:  Head: Normocephalic and atraumatic.  Mouth/Throat: Oropharynx is clear and moist and mucous membranes are normal.  Eyes: Conjunctivae and EOM are normal. Right eye exhibits no discharge. Left eye exhibits no discharge.  Neck: Normal range of motion. Neck supple.  Cardiovascular: Normal rate, regular rhythm, normal heart sounds and intact distal pulses. Exam reveals no gallop and no friction rub.  No murmur heard. Pulmonary/Chest: Effort normal and breath sounds normal. No respiratory distress. He has no decreased breath sounds. He has no wheezes. He has no rhonchi. He has no rales.  CTAB in all lung fields, no w/r/r, no hypoxia or increased WOB, speaking in full sentences, SpO2 98% on RA   Abdominal: Soft. Normal appearance and bowel sounds are normal. He exhibits no distension. There is no tenderness. There is no rigidity, no rebound, no guarding, no CVA tenderness, no tenderness at McBurney's point and negative Murphy's sign.   Musculoskeletal: Normal range of motion.  Neurological: He is alert and oriented to person, place, and time. He has normal strength. No sensory deficit.  Skin: Skin is warm, dry and intact. No rash noted.  Psychiatric: He has a normal mood and affect.  Nursing note and vitals reviewed.    ED Treatments / Results  Labs (all labs ordered are listed, but only abnormal results are displayed) Labs Reviewed  CBC WITH DIFFERENTIAL/PLATELET - Abnormal; Notable for the following components:      Result Value   RBC 3.65 (*)    Hemoglobin 10.9 (*)    HCT 31.9 (*)    Platelets 41 (*)    Lymphs Abs 0.4 (*)    All other components within normal limits  COMPREHENSIVE METABOLIC PANEL - Abnormal; Notable for the following components:   Glucose, Bld 107 (*)    Calcium 8.3 (*)    Total Protein 6.2 (*)    AST 67 (*)    ALT 78 (*)    Alkaline Phosphatase 169 (*)    Total Bilirubin 1.8 (*)    All other components within normal limits  URINALYSIS, ROUTINE W REFLEX MICROSCOPIC - Abnormal; Notable for the following components:   Hgb urine dipstick LARGE (*)    RBC / HPF >50 (*)    All other components within normal limits  CULTURE, BLOOD (ROUTINE X 2)  CULTURE, BLOOD (ROUTINE X 2)  URINE CULTURE  LIPASE, BLOOD  I-STAT CG4 LACTIC ACID, ED    EKG EKG Interpretation  Date/Time:  Saturday December 30 2017 14:04:27 EDT Ventricular Rate:  82 PR Interval:    QRS Duration: 98 QT Interval:  398 QTC Calculation: 465 R Axis:   54 Text Interpretation:  Sinus rhythm Minimal ST depression, inferior leads since last tracing no significant change Confirmed by Daleen Bo [phone]) on 12/30/2017 4:39:42 PM   Radiology Dg Chest 2 View  Result Date: 12/30/2017 CLINICAL DATA:  62 year old male with shaking and emesis currently on chemotherapy. EXAM: CHEST - 2 VIEW COMPARISON:  Prior chest x-ray 08/03/2017 FINDINGS: Right IJ approach low-profile single-lumen power injectable port catheter. The catheter tip  overlies the superior cavoatrial junction. The lungs are clear and negative for focal airspace consolidation, pulmonary edema or suspicious pulmonary nodule. No pleural effusion or pneumothorax. Cardiac and mediastinal contours are within normal limits. No acute fracture or lytic or blastic osseous lesions. The  visualized upper abdominal bowel gas pattern is unremarkable. IMPRESSION: 1. Negative chest x-ray. 2. Right IJ approach single-lumen power injectable port catheter is well positioned with the tip at the superior cavoatrial junction. Electronically Signed   By: Jacqulynn Cadet M.D.   On: 12/30/2017 15:00    Procedures Procedures (including critical care time)  Medications Ordered in ED Medications  ondansetron (ZOFRAN) injection 4 mg (4 mg Intravenous Refused 12/30/17 1410)  sodium chloride 0.9 % bolus 1,000 mL (0 mLs Intravenous Stopped 12/30/17 1551)  acetaminophen (TYLENOL) tablet 650 mg (650 mg Oral Given 12/30/17 1624)     Initial Impression / Assessment and Plan / ED Course  I have reviewed the triage vital signs and the nursing notes.  Pertinent labs & imaging results that were available during my care of the patient were reviewed by me and considered in my medical decision making (see chart for details).     62 y.o. male here with chills/shaking today, n/v, cough x1-2 days. On gemcitabine/Abraxane for pancreatic cancer, finished last cycle 10 days ago. On exam, noted to have temp of 100.5, but otherwise VSS without tachycardia or tachypnea, clear lungs, no abdominal tenderness, well appearing overall. I suspect neutropenic fever could be the cause, but will look for sources of infection, will get labs, cultures, and CXR; will hold off on empiric abx or calling code sepsis given that he doesn't yet meet SIRS criteria, but will see what labs show. Anticipate potential admission if neutropenic fever. Discussed case with my attending Dr. Eulis Foster who agrees with plan.   4:37 PM CBC  w/diff with somewhat stable anemia, Hgb 10.9 down from 11.8 ten days ago; also with thrombocytopenia similar to prior values but slightly lower than recent labs from 10 days ago (plt 41 today, was 115 ten days ago, but was 52 fifteen days ago). CMP with mildly chronically elevated AST/ALT 67/78 respectively, alk phos 169, Tbili 1.8 all which are fairly consistent with prior values. Lipase WNL. Lactic negative. U/A with large hgb which appears to have been present at last check 3wks ago, no evidence of UTI. EKG nonischemic without acute changes. CXR negative.  Pt feeling well; he declined zofran because he doesn't feel nauseated, tolerating PO well. Given tylenol since it's been enough time since antipyretics to get meds. Pt continues to appear well. Will touch base with oncology to discuss case, and get recommendations regarding his plan. Will reassess shortly.   5:18 PM Dr. Lindi Adie of oncology team returning page, feels that he is appropriate for outpatient management. Advised tylenol/motrin for fever, and close f/up with oncology on Monday. Does not feel we need to do anything for the thrombocytopenia, they will keep an eye on it at his follow up visits. Discussed tylenol/motrin/adequate hydration and rest. Offered pt zofran to go home with so he has something at home to take however he states he has compazine to use. Advised other OTC remedies for symptomatic relief. Advised f/up with onc on Monday. Strict return precautions advised with pt. I explained the diagnosis and have given explicit precautions to return to the ER including for any other new or worsening symptoms. The patient understands and accepts the medical plan as it's been dictated and I have answered their questions. Discharge instructions concerning home care and prescriptions have been given. The patient is STABLE and is discharged to home in good condition.    Final Clinical Impressions(s) / ED Diagnoses   Final diagnoses:  Fever and  chills  Cough  Nausea  and vomiting in adult patient  Asymptomatic microscopic hematuria  Chronic anemia  Thrombocytopenia [facility])  Transaminitis    ED Discharge Orders    [address] [place], [place] 12/30/17 1719    Daleen Bo, MD 12/31/17 [phone]

## 2017-12-30 NOTE — Discharge Instructions (Addendum)
Your work up today has been reassuring. Your fever could be from a virus. Alternate between tylenol and ibuprofen as needed for pain/fever. Stay well hydrated. Use your home compazine as directed as needed for nausea/vomiting. You may use over the counter mucinex to help with coughing. Follow up with your oncologist on Monday for close follow up, and for recheck of symptoms. Return to the ER for emergent changes or worsening symptoms.

## 2017-12-30 NOTE — ED Triage Notes (Addendum)
Pt is on 2nd round of Chemo, this morning felt shaky, took a shower, did not help. Started to shake again and vomited x 1. Pt had 2 Aleve around 11 am

## 2017-12-31 ENCOUNTER — Other Ambulatory Visit: Payer: Self-pay | Admitting: Oncology

## 2017-12-31 LAB — URINE CULTURE: CULTURE: NO GROWTH

## 2018-01-02 ENCOUNTER — Other Ambulatory Visit: Payer: Self-pay

## 2018-01-02 DIAGNOSIS — C250 Malignant neoplasm of head of pancreas: Secondary | ICD-10-CM

## 2018-01-03 ENCOUNTER — Ambulatory Visit: Payer: BLUE CROSS/BLUE SHIELD | Admitting: Oncology

## 2018-01-03 ENCOUNTER — Other Ambulatory Visit: Payer: BLUE CROSS/BLUE SHIELD

## 2018-01-03 ENCOUNTER — Inpatient Hospital Stay: Payer: BLUE CROSS/BLUE SHIELD | Attending: Nurse Practitioner

## 2018-01-03 ENCOUNTER — Inpatient Hospital Stay: Payer: BLUE CROSS/BLUE SHIELD

## 2018-01-03 ENCOUNTER — Ambulatory Visit: Payer: BLUE CROSS/BLUE SHIELD

## 2018-01-03 ENCOUNTER — Inpatient Hospital Stay (HOSPITAL_BASED_OUTPATIENT_CLINIC_OR_DEPARTMENT_OTHER): Payer: BLUE CROSS/BLUE SHIELD | Admitting: Oncology

## 2018-01-03 DIAGNOSIS — R21 Rash and other nonspecific skin eruption: Secondary | ICD-10-CM | POA: Diagnosis not present

## 2018-01-03 DIAGNOSIS — I10 Essential (primary) hypertension: Secondary | ICD-10-CM | POA: Diagnosis not present

## 2018-01-03 DIAGNOSIS — C250 Malignant neoplasm of head of pancreas: Secondary | ICD-10-CM

## 2018-01-03 DIAGNOSIS — Z95828 Presence of other vascular implants and grafts: Secondary | ICD-10-CM

## 2018-01-03 DIAGNOSIS — R200 Anesthesia of skin: Secondary | ICD-10-CM | POA: Diagnosis not present

## 2018-01-03 DIAGNOSIS — D696 Thrombocytopenia, unspecified: Secondary | ICD-10-CM

## 2018-01-03 DIAGNOSIS — Z5111 Encounter for antineoplastic chemotherapy: Secondary | ICD-10-CM | POA: Insufficient documentation

## 2018-01-03 LAB — CBC WITH DIFFERENTIAL (CANCER CENTER ONLY)
BASOS ABS: 0 10*3/uL (ref 0.0–0.1)
Basophils Relative: 1 %
EOS PCT: 1 %
Eosinophils Absolute: 0 10*3/uL (ref 0.0–0.5)
HCT: 32.3 % — ABNORMAL LOW (ref 38.4–49.9)
Hemoglobin: 11.1 g/dL — ABNORMAL LOW (ref 13.0–17.1)
Lymphocytes Relative: 17 %
Lymphs Abs: 0.5 10*3/uL — ABNORMAL LOW (ref 0.9–3.3)
MCH: 30 pg (ref 27.2–33.4)
MCHC: 34.4 g/dL (ref 32.0–36.0)
MCV: 87.3 fL (ref 79.3–98.0)
MONO ABS: 0.3 10*3/uL (ref 0.1–0.9)
Monocytes Relative: 11 %
Neutro Abs: 1.9 10*3/uL (ref 1.5–6.5)
Neutrophils Relative %: 70 %
Platelet Count: 87 10*3/uL — ABNORMAL LOW (ref 140–400)
RBC: 3.7 MIL/uL — ABNORMAL LOW (ref 4.20–5.82)
RDW: 14.3 % (ref 11.0–14.6)
WBC Count: 2.8 10*3/uL — ABNORMAL LOW (ref 4.0–10.3)

## 2018-01-03 LAB — CMP (CANCER CENTER ONLY)
ALBUMIN: 3.3 g/dL — AB (ref 3.5–5.0)
ALK PHOS: 230 U/L — AB (ref 38–126)
ALT: 55 U/L — AB (ref 0–44)
AST: 27 U/L (ref 15–41)
Anion gap: 5 (ref 5–15)
BILIRUBIN TOTAL: 0.9 mg/dL (ref 0.3–1.2)
BUN: 10 mg/dL (ref 6–20)
CALCIUM: 8.9 mg/dL (ref 8.9–10.3)
CO2: 30 mmol/L (ref 22–32)
Chloride: 106 mmol/L (ref 98–111)
Creatinine: 0.85 mg/dL (ref 0.61–1.24)
GFR, Est AFR Am: >60 mL/min (ref >60)
GFR, Estimated: >60 mL/min (ref >60)
GLUCOSE: 96 mg/dL (ref 70–99)
Potassium: 4.2 mmol/L (ref 3.5–5.1)
Sodium: 141 mmol/L (ref 135–145)
TOTAL PROTEIN: 5.8 g/dL — AB (ref 6.5–8.1)

## 2018-01-03 MED ORDER — PROCHLORPERAZINE MALEATE 10 MG PO TABS
ORAL_TABLET | ORAL | Status: AC
  Filled 2018-01-03: qty 1

## 2018-01-03 MED ORDER — HEPARIN SOD (PORK) LOCK FLUSH 100 UNIT/ML IV SOLN
500.0000 [IU] | Freq: Once | INTRAVENOUS | Status: AC | PRN
  Administered 2018-01-03: 500 [IU]
  Filled 2018-01-03: qty 5

## 2018-01-03 MED ORDER — LIDOCAINE-PRILOCAINE 2.5-2.5 % EX CREA: 1.0000 "application " | TOPICAL_CREAM | CUTANEOUS | 2 refills | Status: DC | PRN

## 2018-01-03 MED ORDER — PROCHLORPERAZINE MALEATE 10 MG PO TABS
10.0000 mg | ORAL_TABLET | Freq: Once | ORAL | Status: AC
  Administered 2018-01-03: 10 mg via ORAL

## 2018-01-03 MED ORDER — SODIUM CHLORIDE 0.9% FLUSH
10.0000 mL | Freq: Once | INTRAVENOUS | Status: AC
  Administered 2018-01-03: 10 mL
  Filled 2018-01-03: qty 10

## 2018-01-03 MED ORDER — SODIUM CHLORIDE 0.9 % IV SOLN
800.0000 mg/m2 | Freq: Once | INTRAVENOUS | Status: AC
  Administered 2018-01-03: 1786 mg via INTRAVENOUS
  Filled 2018-01-03: qty 46.97

## 2018-01-03 MED ORDER — PACLITAXEL PROTEIN-BOUND CHEMO INJECTION 100 MG
100.0000 mg/m2 | Freq: Once | INTRAVENOUS | Status: AC
  Administered 2018-01-03: 225 mg via INTRAVENOUS
  Filled 2018-01-03: qty 45

## 2018-01-03 MED ORDER — SODIUM CHLORIDE 0.9 % IV SOLN
Freq: Once | INTRAVENOUS | Status: AC
  Administered 2018-01-03: 12:00:00 via INTRAVENOUS

## 2018-01-03 MED ORDER — SODIUM CHLORIDE 0.9% FLUSH
10.0000 mL | INTRAVENOUS | Status: DC | PRN
  Administered 2018-01-03: 10 mL
  Filled 2018-01-03: qty 10

## 2018-01-03 NOTE — Progress Notes (Signed)
[facility] OFFICE PROGRESS NOTE   Diagnosis: Pancreas cancer  INTERVAL HISTORY:   Cory Kelly completed another treatment with gemcitabine/Abraxane 12/20/2017.  He presented to the emergency room on 12/30/2017 with a fever and chills.  His temperature was measured at 100.5 in the emergency room.  He had a nonproductive cough.  There was no apparent source for infection. The neutrophil count returned at 4.2.  When he was discharged home.  No recurrent fever.  He generally feels well.  Good appetite.  He is working and exercising.  He has increased pain and numbness in the feet after prolonged ambulation. Objective:  Vital signs in last 24 hours:  Blood pressure (!) 156/92, pulse 60, temperature 97.7 F (36.5 C), temperature source Oral, resp. rate 17, height 6' 3.75" (1.924 m), weight 227 lb 12.8 oz (103.3 kg), SpO2 100 %.    HEENT: No thrush or ulcers Resp: Lungs clear bilaterally Cardio: Regular rate and rhythm GI: No hepatomegaly, no mass, nontender Vascular: No leg edema     Portacath/PICC-without erythema  Lab Results:  Lab Results  Component Value Date   WBC 2.8 (L) 01/03/2018   HGB 11.1 (L) 01/03/2018   HCT 32.3 (L) 01/03/2018   MCV 87.3 01/03/2018   PLT 87 (L) 01/03/2018   NEUTROABS 1.9 01/03/2018    CMP  Lab Results  Component Value Date   NA 141 01/03/2018   K 4.2 01/03/2018   CL 106 01/03/2018   CO2 30 01/03/2018   GLUCOSE 96 01/03/2018   BUN 10 01/03/2018   CREATININE 0.85 01/03/2018   CALCIUM 8.9 01/03/2018   PROT 5.8 (L) 01/03/2018   ALBUMIN 3.3 (L) 01/03/2018   AST 27 01/03/2018   ALT 55 (H) 01/03/2018   ALKPHOS 230 (H) 01/03/2018   BILITOT 0.9 01/03/2018   GFRNONAA >60 01/03/2018   GFRAA >60 01/03/2018    Medications: I have reviewed the patient's current medications.   Assessment/Plan: 1. Pancreas head mass  CT abdomen/pelvis 08/03/2017-fullness in the head of the pancreas with stranding in the peripancreatic fat and  pancreatic ductal dilatation.   MRI abdomen 08/04/2017-findings favored to represent acute on chronic pancreatitis; equivocal soft tissue fullness within the head and uncinate process of the pancreas; indeterminate too small to characterize right hepatic lobe lesion; small volume abdominal ascites; splenomegaly.   CA-19-9 elevated at 977on 08/04/2017.  Status postupper EUS 08/10/2017-findings of an irregular masslike region in the pancreatic head measuring approximately 2.7 cm. This directly abutted the main portal vein but no other major vascular structures. Biopsies obtained with preliminary cytology positive for malignancy, likely adenocarcinoma. The final report is pending. The common bile duct and main pancreatic duct were both dilated. ERCP was then proceeded with forstent placement. Multiple attempts were made tocannulate the bile duct without success. The procedure was aborted.  08/16/2017 ERCP with placement of a metal biliary stent in the common bile duct by Dr. Francella Solian at [facility].  Cycle 1 FOLFIRINOX 08/31/2017  Cycle 2 FOLFIRINOX 09/14/2017  Cycle 3 FOLFIRINOX 09/28/2017  Cycle 4 FOLFIRINOX 10/12/2017 (oxaliplatin dose reduced secondary to thrombocytopenia)  Cycle 5 FOLFIRINOX 10/26/2017  Cycle 6 FOLFIRINOX 11/09/2017  Restaging CTs at [facility] 11/29/2017- no definitive evidence of distant metastatic disease. 2 subcentimeter liver lesions described on prior MRI not visualized on CT. Ill-defined pancreatic head mass stable to decreased in size measuring 2.1 x 2 cm. Peripancreatic inflammatory stranding decreased from prior. No biliary ductal dilatation. Celiac axis less than 180 degrees abutment. Common hepatic artery with greater  than 180 degrees encasement; superior mesenteric artery with short segment less than 180 degrees abutment; portal vein/superior mesenteric vein with greater than 180 degrees encasement of the extrahepatic portal vein with associated circumferential narrowing  at the portomesenteric, overall substantially improved from prior examination. Now less than 180 degree abutment of the SMV. The portal vein and SMV remain patent. Splenic vein patent.  Cycle 1 gemcitabine/Abraxane 12/06/2017  Cycle 2 gemcitabine/Abraxane 12/20/2017  Cycle 3 gemcitabine/Abraxane 01/03/2018 2. Hypertension 3. Port-A-Cath placement 08/30/2017 4. History of elevated bilirubin-questionGilbert's syndrome 5. Mild leukopenia, thrombocytopenia-question secondary to portal hypertension/splenomegaly;09/14/2017 white count improved, platelet count stable; progressive thrombocytopenia following gemcitabine/Abraxane 6. Kidney stones   Disposition: Cory Kelly appears stable.  He has completed 3 cycles of gemcitabine/Abraxane.  He developed a fever approximately 10 days following cycle 2 chemotherapy with no source for infection identified.  He will call for a recurrent fever.  He has mild thrombocytopenia.  He had thrombocytopenia prior to and following the most recent cycle of chemotherapy.  He will contact us for bleeding or bruising.  The platelet count is improved compared to when he was seen in the emergency room last week.  We will follow-up on the CA 19-9 from today.  Cory Kelly will return for an office visit and chemotherapy in 2 weeks.  Betsy Coder, MD  01/03/2018  4:20 PM

## 2018-01-03 NOTE — Progress Notes (Signed)
MD reviewed labs- ok to treat despite low platelets.

## 2018-01-03 NOTE — Patient Instructions (Signed)
[facility] Discharge Instructions for Patients Receiving Chemotherapy  Today you received the following chemotherapy agents: Paclitaxel-protein bound (Abraxane) and Gemcitabine (Gemzar)  To help prevent nausea and vomiting after your treatment, we encourage you to take your nausea medication as prescribed.    If you develop nausea and vomiting that is not controlled by your nausea medication, call the clinic.   BELOW ARE SYMPTOMS THAT SHOULD BE REPORTED IMMEDIATELY:  *FEVER GREATER THAN 100.5 F  *CHILLS WITH OR WITHOUT FEVER  NAUSEA AND VOMITING THAT IS NOT CONTROLLED WITH YOUR NAUSEA MEDICATION  *UNUSUAL SHORTNESS OF BREATH  *UNUSUAL BRUISING OR BLEEDING  TENDERNESS IN MOUTH AND THROAT WITH OR WITHOUT PRESENCE OF ULCERS  *URINARY PROBLEMS  *BOWEL PROBLEMS  UNUSUAL RASH Items with * indicate a potential emergency and should be followed up as soon as possible.  Feel free to call the clinic should you have any questions or concerns. The clinic phone number is [phone].  Please show the [facility] at check-in to the Emergency Department and triage nurse.

## 2018-01-04 LAB — CANCER ANTIGEN 19-9: CAN 19-9: 53 U/mL — AB (ref 0–35)

## 2018-01-04 LAB — CULTURE, BLOOD (ROUTINE X 2)
CULTURE: NO GROWTH
Special Requests: ADEQUATE

## 2018-01-10 ENCOUNTER — Encounter (HOSPITAL_COMMUNITY): Payer: Self-pay | Admitting: *Deleted

## 2018-01-10 ENCOUNTER — Emergency Department (HOSPITAL_COMMUNITY): Payer: BLUE CROSS/BLUE SHIELD

## 2018-01-10 ENCOUNTER — Other Ambulatory Visit: Payer: Self-pay

## 2018-01-10 ENCOUNTER — Emergency Department (HOSPITAL_COMMUNITY)
Admission: EM | Admit: 2018-01-10 | Discharge: 2018-01-10 | Disposition: A | Discharge status: Home / Self Care | Payer: BLUE CROSS/BLUE SHIELD | Attending: Emergency Medicine | Admitting: Emergency Medicine

## 2018-01-10 DIAGNOSIS — I10 Essential (primary) hypertension: Secondary | ICD-10-CM | POA: Insufficient documentation

## 2018-01-10 DIAGNOSIS — R509 Fever, unspecified: Secondary | ICD-10-CM | POA: Insufficient documentation

## 2018-01-10 DIAGNOSIS — C259 Malignant neoplasm of pancreas, unspecified: Secondary | ICD-10-CM

## 2018-01-10 DIAGNOSIS — Z79899 Other long term (current) drug therapy: Secondary | ICD-10-CM | POA: Diagnosis not present

## 2018-01-10 LAB — COMPREHENSIVE METABOLIC PANEL
ALBUMIN: 3.4 g/dL — AB (ref 3.5–5.0)
ALT: 117 U/L — ABNORMAL HIGH (ref 0–44)
ANION GAP: 11 (ref 5–15)
AST: 94 U/L — ABNORMAL HIGH (ref 15–41)
Alkaline Phosphatase: 274 U/L — ABNORMAL HIGH (ref 38–126)
BUN: 9 mg/dL (ref 6–20)
CO2: 27 mmol/L (ref 22–32)
Calcium: 8.7 mg/dL — ABNORMAL LOW (ref 8.9–10.3)
Chloride: 99 mmol/L (ref 98–111)
Creatinine, Ser: 0.8 mg/dL (ref 0.61–1.24)
GFR calc non Af Amer: >60 mL/min (ref >60)
GLUCOSE: 111 mg/dL — AB (ref 70–99)
POTASSIUM: 3.2 mmol/L — AB (ref 3.5–5.1)
Sodium: 137 mmol/L (ref 135–145)
TOTAL PROTEIN: 6 g/dL — AB (ref 6.5–8.1)
Total Bilirubin: 1.7 mg/dL — ABNORMAL HIGH (ref 0.3–1.2)

## 2018-01-10 LAB — CBC WITH DIFFERENTIAL/PLATELET
BASOS ABS: 0 10*3/uL (ref 0.0–0.1)
BASOS PCT: 0 %
EOS ABS: 0 10*3/uL (ref 0.0–0.7)
EOS PCT: 0 %
HEMATOCRIT: 31.1 % — AB (ref 39.0–52.0)
Hemoglobin: 11 g/dL — ABNORMAL LOW (ref 13.0–17.0)
Lymphocytes Relative: 6 %
Lymphs Abs: 0.2 10*3/uL — ABNORMAL LOW (ref 0.7–4.0)
MCH: 30.1 pg (ref 26.0–34.0)
MCHC: 35.4 g/dL (ref 30.0–36.0)
MCV: 85 fL (ref 78.0–100.0)
MONO ABS: 0.1 10*3/uL (ref 0.1–1.0)
MONOS PCT: 3 %
Neutro Abs: 3.4 10*3/uL (ref 1.7–7.7)
Neutrophils Relative %: 91 %
PLATELETS: 109 10*3/uL — AB (ref 150–400)
RBC: 3.66 MIL/uL — ABNORMAL LOW (ref 4.22–5.81)
RDW: 13.5 % (ref 11.5–15.5)
WBC: 3.8 10*3/uL — ABNORMAL LOW (ref 4.0–10.5)

## 2018-01-10 LAB — URINALYSIS, ROUTINE W REFLEX MICROSCOPIC
Bacteria, UA: NONE SEEN
Bilirubin Urine: NEGATIVE
GLUCOSE, UA: NEGATIVE mg/dL
Ketones, ur: NEGATIVE mg/dL
Leukocytes, UA: NEGATIVE
Nitrite: NEGATIVE
PROTEIN: NEGATIVE mg/dL
SPECIFIC GRAVITY, URINE: 1.012 (ref 1.005–1.030)
pH: 6 (ref 5.0–8.0)

## 2018-01-10 LAB — LIPASE, BLOOD: Lipase: 19 U/L (ref 11–51)

## 2018-01-10 LAB — I-STAT CG4 LACTIC ACID, ED: LACTIC ACID, VENOUS: 0.84 mmol/L (ref 0.5–1.9)

## 2018-01-10 MED ORDER — IBUPROFEN 600 MG PO TABS: 600.0000 mg | ORAL_TABLET | Freq: Four times a day (QID) | ORAL | 0 refills | Status: DC | PRN

## 2018-01-10 MED ORDER — IBUPROFEN 200 MG PO TABS
600.0000 mg | ORAL_TABLET | Freq: Once | ORAL | Status: AC
  Administered 2018-01-10: 600 mg via ORAL
  Filled 2018-01-10: qty 3

## 2018-01-10 MED ORDER — ACETAMINOPHEN 500 MG PO TABS
1000.0000 mg | ORAL_TABLET | Freq: Once | ORAL | Status: DC
  Filled 2018-01-10: qty 2

## 2018-01-10 NOTE — Discharge Instructions (Addendum)
1.  Call Dr. Gearldine Shown office later today to update his nurse on how you are doing. 2.  Alternate acetaminophen 650 mg every 6 hours with ibuprofen 600 mg for fever. 3.  Return to the emergency department if any of your symptoms are worsening.

## 2018-01-10 NOTE — ED Provider Notes (Signed)
[facility] DEPT Provider Note   CSN: [id number] Arrival date & time: 01/10/18  0557     History   Chief Complaint Chief Complaint  Patient presents with  . Fever    HPI Cory Kelly is a 62 y.o. male.  HPI 62 year old male with history of hypertension, pancreatic cancer currently on gemcitabine\Abraxane (third cycle 7\2\2019).  Patient developed fever last night, T-max 101.2.  He has some symptoms of chills and sweats overnight.  One episode of vomiting.  No other localizing symptoms.  There has been a circular, erythematous rash on his upper back that his wife thought might be ringworm.  They noticed it 3 days ago.  She has been applying nystatin ointment.  No other positives on review of systems.  Patient took acetaminophen at 3:45 in the morning.  He reports that the symptoms are much better now.  Patient had similar presentation 6\29.  He had follow-up with Dr. Benay Spice several days later.  Since then, he had been at baseline.  No antibiotics were used at that time. Past Medical History:  Diagnosis Date  . Abdominal pain    due to bloating  . Bloating   . Cancer ([place])    skin cancer  . Essential hypertension   . Fatigue   . History of weight loss   . HOH (hard of hearing)    no hearing aids  . Neuralgia   . Stress    loss of father in Dec 2018.    Patient Active Problem List   Diagnosis Date Noted  . Port-A-Cath in place 08/31/2017  . 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[place]) 08/21/2017  . Abnormal pancreas function test   . Pancreatic abnormality   . 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 Elevated LFTs 08/04/2017  . 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 Elevated serum creatinine 11/20/2012  . Elevated PSA 03/05/2012  . Essential hypertension 11/02/2011  . Hyperlipemia 11/02/2011    Past Surgical History:  Procedure Laterality Date  . APPENDECTOMY    . ENDOSCOPIC RETROGRADE CHOLANGIOPANCREATOGRAPHY (ERCP) WITH PROPOFOL N/A  08/10/2017   Procedure: ENDOSCOPIC RETROGRADE CHOLANGIOPANCREATOGRAPHY (ERCP) WITH PROPOFOL;  Surgeon: Milus Banister, MD;  Location: [facility] ENDOSCOPY;  Service: Endoscopy;  Laterality: N/A;  . EUS N/A 08/10/2017   Procedure: UPPER ENDOSCOPIC ULTRASOUND (EUS) RADIAL;  Surgeon: Milus Banister, MD;  Location: [facility] ENDOSCOPY;  Service: Endoscopy;  Laterality: N/A;  . EYE SURGERY     lasik/left eye  . IR FLUORO GUIDE PORT INSERTION RIGHT  08/30/2017  . IR US GUIDE VASC ACCESS RIGHT  08/30/2017  . SKIN CANCER EXCISION          Home Medications    Prior to Admission medications   Medication Sig Start Date End Date Taking? Authorizing Provider  acetaminophen (TYLENOL) 500 MG tablet Take 500-1,000 mg by mouth every 6 (six) hours as needed for moderate pain.   Yes [provider]  ibuprofen (ADVIL,MOTRIN) 600 MG tablet Take 1 tablet (600 mg total) by mouth every 6 (six) hours as needed. Patient taking differently: Take 600 mg by mouth every 6 (six) hours as needed.  12/07/17  Yes Cardama, Grayce Sessions, MD  lidocaine-prilocaine (EMLA) cream Apply 1 application topically as needed. Apply to portacath site 1-2 hours prior to use 01/03/18  Yes Ladell Pier, MD  losartan (COZAAR) 50 MG tablet TAKE 1 TABLET BY MOUTH DAILY 11/21/17  Yes Weber, Sarah L, PA-C  potassium chloride (MICRO-K) 10 MEQ CR capsule Take 20 meq (  2 tabs) in the morning and 10 meq (1 tab) in the evening. 12/08/17  Yes Ladell Pier, MD  tamsulosin (FLOMAX) 0.4 MG CAPS capsule Take 1 capsule (0.4 mg total) by mouth daily. Patient taking differently: Take 0.4 mg by mouth daily as needed (kidney stone).  11/21/17  Yes Ladell Pier, MD  traMADol (ULTRAM) 50 MG tablet Take 1 tablet (50 mg total) by mouth every 8 (eight) hours as needed. Patient taking differently: Take 50 mg by mouth every 8 (eight) hours as needed.  09/28/17  Yes Ladell Pier, MD  cyclobenzaprine (FLEXERIL) 5 MG tablet Take 1 tablet (5 mg total) by mouth every 12  (twelve) hours as needed for muscle spasms. Patient not taking: Reported on 12/07/2017 10/05/17   Ladell Pier, MD  ibuprofen (ADVIL,MOTRIN) 600 MG tablet Take 1 tablet (600 mg total) by mouth every 6 (six) hours as needed. 01/10/18   Charlesetta Shanks, MD  prochlorperazine (COMPAZINE) 10 MG tablet Take 1 tablet (10 mg total) by mouth every 6 (six) hours as needed for nausea or vomiting. 08/21/17   Owens Shark, NP  triamcinolone cream (KENALOG) 0.5 % Apply 1 application topically 2 (two) times daily. Patient not taking: Reported on 12/30/2017 12/07/17   Mancel Bale, PA-C    Family History Family History  Problem Relation Age of Onset  . Hyperlipidemia Father   . Heart disease Father   . Dementia Father   . Memory loss Mother     Social History Social History   Tobacco Use  . Smoking status: Never Smoker  . Smokeless tobacco: Never Used  Substance Use Topics  . Alcohol use: No  . Drug use: No     Allergies   Patient has no known allergies.   Review of Systems Review of Systems 10 Systems reviewed and are negative for acute change except as noted in the HPI.   Physical Exam Updated Vital Signs BP (!) 142/76   Pulse 81   Temp 100.3 F (37.9 C)   Resp 16   SpO2 99%   Physical Exam  Constitutional: He is oriented to person, place, and time. He appears well-developed and well-nourished.  HENT:  Head: Normocephalic and atraumatic.  Bilateral TMs normal.  Dentition very good condition.  Mucous membranes pink and moist.  Eyes: Pupils are equal, round, and reactive to light. EOM are normal.  Neck: Neck supple.  Cardiovascular: Normal rate, regular rhythm, normal heart sounds and intact distal pulses.  Pulmonary/Chest: Effort normal and breath sounds normal.  Abdominal: Soft. Bowel sounds are normal. He exhibits no distension. There is no tenderness.  Musculoskeletal: Normal range of motion. He exhibits no edema or tenderness.  Excellent condition of lower extremities.   No peripheral edema.  Condition of skin on the feet very good.  No intertriginous groin Candida  Lymphadenopathy:    He has no cervical adenopathy.  Neurological: He is alert and oriented to person, place, and time. He has normal strength. He exhibits normal muscle tone. Coordination normal. GCS eye subscore is 4. GCS verbal subscore is 5. GCS motor subscore is 6.  Skin: Skin is warm, dry and intact.  2.5 cm annular rash on the upper back.  See attached image.  Psychiatric: He has a normal mood and affect.   Very faintly visible outer rim of erythema is due to a bandage the wife was applying.  They have been applying a nystatin ointment.     Patient describes intertriginous groin rash  that was much more erythematous.  He has been applying nystatin powder with near resolution of rash.   ED Treatments / Results  Labs (all labs ordered are listed, but only abnormal results are displayed) Labs Reviewed  COMPREHENSIVE METABOLIC PANEL - Abnormal; Notable for the following components:      Result Value   Potassium 3.2 (*)    Glucose, Bld 111 (*)    Calcium 8.7 (*)    Total Protein 6.0 (*)    Albumin 3.4 (*)    AST 94 (*)    ALT 117 (*)    Alkaline Phosphatase 274 (*)    Total Bilirubin 1.7 (*)    All other components within normal limits  CBC WITH DIFFERENTIAL/PLATELET - Abnormal; Notable for the following components:   WBC 3.8 (*)    RBC 3.66 (*)    Hemoglobin 11.0 (*)    HCT 31.1 (*)    Platelets 109 (*)    Lymphs Abs 0.2 (*)    All other components within normal limits  URINALYSIS, ROUTINE W REFLEX MICROSCOPIC - Abnormal; Notable for the following components:   Hgb urine dipstick SMALL (*)    All other components within normal limits  URINE CULTURE  CULTURE, BLOOD (ROUTINE X 2)  CULTURE, BLOOD (ROUTINE X 2) W REFLEX TO ID PANEL  LIPASE, BLOOD  I-STAT CG4 LACTIC ACID, ED    EKG None  Radiology Dg Chest 2 View  Result Date: 01/10/2018 CLINICAL DATA:  62 year old male  with pancreatic cancer. New onset fever at home last night with nausea, vomiting, chills. EXAM: CHEST - 2 VIEW COMPARISON:  12/30/2017 and earlier. FINDINGS: Stable right chest power port. Lung volumes and mediastinal contours are stable, borderline to mild cardiomegaly. The lungs remain clear. No pneumothorax, pleural effusion, pulmonary nodule. Visualized tracheal air column is within normal limits. No acute osseous abnormality identified. Negative visible bowel gas pattern. IMPRESSION: Stable and negative.  No acute cardiopulmonary abnormality. Electronically Signed   By: Genevie Ann M.D.   On: 01/10/2018 07:49    Procedures Procedures (including critical care time)  Medications Ordered in ED Medications  ibuprofen (ADVIL,MOTRIN) tablet 600 mg (600 mg Oral Given 01/10/18 0843)     Initial Impression / Assessment and Plan / ED Course  I have reviewed the triage vital signs and the nursing notes.  Pertinent labs & imaging results that were available during my care of the patient were reviewed by me and considered in my medical decision making (see chart for details).     Consult: Dr. Malachy Mood oncology.  At this time, with patient clinically well in appearance.  May discharge.  Will not initiate antibiotics empirically.  Patient is to call their office later today to update on condition.  His physician that this is a delayed reaction to patient's chemotherapy as visit is very similar to last emergency department visit.  Final Clinical Impressions(s) / ED Diagnoses   Final diagnoses:  Fever, unspecified fever cause  Malignant neoplasm of pancreas, unspecified location of malignancy [facility])   Patient presents as per above with low-grade fever.  No localizing symptoms.  He is clinically well in appearance and symptoms have improved.  There is suspicion that this is a delayed response to his chemotherapy his presentation is very similar in timing and quality as previous visit.  Patient again has a mild  transaminitis.  Same symptoms.  Asymptomatic with antipyretic treatment.  Consultation has been done with Dr. Benay Spice.  Plan will be for discharge with  very close monitoring. ED Discharge Orders        Ordered    ibuprofen (ADVIL,MOTRIN) 600 MG tablet  Every 6 hours PRN     01/10/18 0855       Charlesetta Shanks, MD 01/10/18 7622

## 2018-01-10 NOTE — ED Triage Notes (Signed)
Onset of fever last night. Chills this morning and vomiting. Last took tylenol for fever. Pancreatic cancer pt, last chemo 1 week ago, has a port a cath.

## 2018-01-10 NOTE — ED Notes (Signed)
Second set of blood cultures could not be obtained, 3 attempts were made.

## 2018-01-11 LAB — URINE CULTURE: Culture: NO GROWTH

## 2018-01-13 ENCOUNTER — Other Ambulatory Visit: Payer: Self-pay | Admitting: Physician Assistant

## 2018-01-15 ENCOUNTER — Other Ambulatory Visit: Payer: Self-pay | Admitting: Oncology

## 2018-01-15 LAB — CULTURE, BLOOD (ROUTINE X 2): CULTURE: NO GROWTH

## 2018-01-17 ENCOUNTER — Inpatient Hospital Stay: Payer: BLUE CROSS/BLUE SHIELD

## 2018-01-17 ENCOUNTER — Inpatient Hospital Stay (HOSPITAL_BASED_OUTPATIENT_CLINIC_OR_DEPARTMENT_OTHER): Payer: BLUE CROSS/BLUE SHIELD | Admitting: Oncology

## 2018-01-17 VITALS — BP 169/73 | HR 52 | Temp 97.5°F | Resp 20 | Ht 75.75 in | Wt 223.3 lb

## 2018-01-17 DIAGNOSIS — I10 Essential (primary) hypertension: Secondary | ICD-10-CM | POA: Diagnosis not present

## 2018-01-17 DIAGNOSIS — R200 Anesthesia of skin: Secondary | ICD-10-CM | POA: Diagnosis not present

## 2018-01-17 DIAGNOSIS — C250 Malignant neoplasm of head of pancreas: Secondary | ICD-10-CM

## 2018-01-17 DIAGNOSIS — R21 Rash and other nonspecific skin eruption: Secondary | ICD-10-CM | POA: Diagnosis not present

## 2018-01-17 DIAGNOSIS — Z5111 Encounter for antineoplastic chemotherapy: Secondary | ICD-10-CM | POA: Diagnosis not present

## 2018-01-17 DIAGNOSIS — Z95828 Presence of other vascular implants and grafts: Secondary | ICD-10-CM

## 2018-01-17 LAB — CMP (CANCER CENTER ONLY)
ALT: 64 U/L — ABNORMAL HIGH (ref 0–44)
ANION GAP: 7 (ref 5–15)
AST: 39 U/L (ref 15–41)
Albumin: 3.4 g/dL — ABNORMAL LOW (ref 3.5–5.0)
Alkaline Phosphatase: 307 U/L — ABNORMAL HIGH (ref 38–126)
BUN: 9 mg/dL (ref 6–20)
CHLORIDE: 103 mmol/L (ref 98–111)
CO2: 28 mmol/L (ref 22–32)
CREATININE: 0.91 mg/dL (ref 0.61–1.24)
Calcium: 8.7 mg/dL — ABNORMAL LOW (ref 8.9–10.3)
Glucose, Bld: 150 mg/dL — ABNORMAL HIGH (ref 70–99)
POTASSIUM: 4.1 mmol/L (ref 3.5–5.1)
SODIUM: 138 mmol/L (ref 135–145)
Total Bilirubin: 0.9 mg/dL (ref 0.3–1.2)
Total Protein: 6 g/dL — ABNORMAL LOW (ref 6.5–8.1)

## 2018-01-17 LAB — CBC WITH DIFFERENTIAL (CANCER CENTER ONLY)
Basophils Absolute: 0 10*3/uL (ref 0.0–0.1)
Basophils Relative: 1 %
EOS ABS: 0 10*3/uL (ref 0.0–0.5)
EOS PCT: 1 %
HCT: 33.6 % — ABNORMAL LOW (ref 38.4–49.9)
Hemoglobin: 11.5 g/dL — ABNORMAL LOW (ref 13.0–17.1)
LYMPHS ABS: 0.6 10*3/uL — AB (ref 0.9–3.3)
LYMPHS PCT: 15 %
MCH: 30.1 pg (ref 27.2–33.4)
MCHC: 34.4 g/dL (ref 32.0–36.0)
MCV: 87.7 fL (ref 79.3–98.0)
MONO ABS: 0.3 10*3/uL (ref 0.1–0.9)
Monocytes Relative: 7 %
Neutro Abs: 3.2 10*3/uL (ref 1.5–6.5)
Neutrophils Relative %: 76 %
PLATELETS: 149 10*3/uL (ref 140–400)
RBC: 3.83 MIL/uL — AB (ref 4.20–5.82)
RDW: 15.4 % — AB (ref 11.0–14.6)
WBC: 4.2 10*3/uL (ref 4.0–10.3)

## 2018-01-17 MED ORDER — PACLITAXEL PROTEIN-BOUND CHEMO INJECTION 100 MG
100.0000 mg/m2 | Freq: Once | Status: AC
  Administered 2018-01-17: 225 mg via INTRAVENOUS
  Filled 2018-01-17: qty 45

## 2018-01-17 MED ORDER — SODIUM CHLORIDE 0.9 % IV SOLN
Freq: Once | INTRAVENOUS | Status: AC
  Administered 2018-01-17: 11:00:00 via INTRAVENOUS

## 2018-01-17 MED ORDER — SODIUM CHLORIDE 0.9% FLUSH
10.0000 mL | Freq: Once | INTRAVENOUS | Status: AC
  Administered 2018-01-17: 10 mL
  Filled 2018-01-17: qty 10

## 2018-01-17 MED ORDER — PROCHLORPERAZINE MALEATE 10 MG PO TABS
ORAL_TABLET | ORAL | Status: AC
  Filled 2018-01-17: qty 1

## 2018-01-17 MED ORDER — SODIUM CHLORIDE 0.9% FLUSH
10.0000 mL | INTRAVENOUS | Status: DC | PRN
  Administered 2018-01-17: 10 mL
  Filled 2018-01-17: qty 10

## 2018-01-17 MED ORDER — SODIUM CHLORIDE 0.9 % IV SOLN
800.0000 mg/m2 | Freq: Once | INTRAVENOUS | Status: AC
  Administered 2018-01-17: 1786 mg via INTRAVENOUS
  Filled 2018-01-17: qty 46.97

## 2018-01-17 MED ORDER — PROCHLORPERAZINE MALEATE 10 MG PO TABS
10.0000 mg | ORAL_TABLET | Freq: Once | ORAL | Status: AC
  Administered 2018-01-17: 10 mg via ORAL

## 2018-01-17 MED ORDER — HEPARIN SOD (PORK) LOCK FLUSH 100 UNIT/ML IV SOLN
500.0000 [IU] | Freq: Once | INTRAVENOUS | Status: AC | PRN
  Administered 2018-01-17: 500 [IU]
  Filled 2018-01-17: qty 5

## 2018-01-17 NOTE — Patient Instructions (Signed)
[facility] Discharge Instructions for Patients Receiving Chemotherapy  Today you received the following chemotherapy agents: Abraxane and Gemzar   To help prevent nausea and vomiting after your treatment, we encourage you to take your nausea medication as directed.    If you develop nausea and vomiting that is not controlled by your nausea medication, call the clinic.   BELOW ARE SYMPTOMS THAT SHOULD BE REPORTED IMMEDIATELY:  *FEVER GREATER THAN 100.5 F  *CHILLS WITH OR WITHOUT FEVER  NAUSEA AND VOMITING THAT IS NOT CONTROLLED WITH YOUR NAUSEA MEDICATION  *UNUSUAL SHORTNESS OF BREATH  *UNUSUAL BRUISING OR BLEEDING  TENDERNESS IN MOUTH AND THROAT WITH OR WITHOUT PRESENCE OF ULCERS  *URINARY PROBLEMS  *BOWEL PROBLEMS  UNUSUAL RASH Items with * indicate a potential emergency and should be followed up as soon as possible.  Feel free to call the clinic should you have any questions or concerns. The clinic phone number is [phone].  Please show the CHEMO ALERT CARD at check-in to the Emergency Department and triage nurse.   

## 2018-01-17 NOTE — Progress Notes (Signed)
[facility] OFFICE PROGRESS NOTE   Diagnosis: Pancreas cancer  INTERVAL HISTORY:   Cory Kelly returns as scheduled.  He completed another cycle of gemcitabine/Abraxane on 01/03/2018.  He reports not feeling well following this cycle of chemotherapy.  No specific symptom.  He has noted increased numbness in the feet. He developed a fever and chills on 01/10/2018.  He was seen in the emergency room.  No source for infection was identified.  Cultures returned negative.  The fever resolved on 01/10/2018 and has not returned.  He had an episode of vomiting when he had a fever.  He was noted to have an annular rash at the left upper back.  This has improved.  The liver enzymes were mildly elevated on 01/10/2018.  Objective:  Vital signs in last 24 hours:  Blood pressure (!) 169/73, pulse (!) 52, temperature (!) 97.5 F (36.4 C), temperature source Oral, resp. rate 20, height 6' 3.75" (1.924 m), weight 223 lb 4.8 oz (101.3 kg), SpO2 98 %.    HEENT: No thrush or ulcers Resp: Lungs are bilaterally Cardio: Regular rate and rhythm GI: No hepatomegaly, nontender, no mass Vascular: No leg edema  Skin: Resolving localized rash at the left upper back  Portacath/PICC-without erythema  Lab Results:  Lab Results  Component Value Date   WBC 4.2 01/17/2018   HGB 11.5 (L) 01/17/2018   HCT 33.6 (L) 01/17/2018   MCV 87.7 01/17/2018   PLT 149 01/17/2018   NEUTROABS 3.2 01/17/2018    CMP  Lab Results  Component Value Date   NA 138 01/17/2018   K 4.1 01/17/2018   CL 103 01/17/2018   CO2 28 01/17/2018   GLUCOSE 150 (H) 01/17/2018   BUN 9 01/17/2018   CREATININE 0.91 01/17/2018   CALCIUM 8.7 (L) 01/17/2018   PROT 6.0 (L) 01/17/2018   ALBUMIN 3.4 (L) 01/17/2018   AST 39 01/17/2018   ALT 64 (H) 01/17/2018   ALKPHOS 307 (H) 01/17/2018   BILITOT 0.9 01/17/2018   GFRNONAA >60 01/17/2018   GFRAA >60 01/17/2018   CA 19-9 on 01/03/2018-53 Medications: I have reviewed the patient's  current medications.   Assessment/Plan: 1. Pancreas head mass  CT abdomen/pelvis 08/03/2017-fullness in the head of the pancreas with stranding in the peripancreatic fat and pancreatic ductal dilatation.   MRI abdomen 08/04/2017-findings favored to represent acute on chronic pancreatitis; equivocal soft tissue fullness within the head and uncinate process of the pancreas; indeterminate too small to characterize right hepatic lobe lesion; small volume abdominal ascites; splenomegaly.   CA-19-9 elevated at 977on 08/04/2017.  Status postupper EUS 08/10/2017-findings of an irregular masslike region in the pancreatic head measuring approximately 2.7 cm. This directly abutted the main portal vein but no other major vascular structures. Biopsies obtained with preliminary cytology positive for malignancy, likely adenocarcinoma. The final report is pending. The common bile duct and main pancreatic duct were both dilated. ERCP was then proceeded with forstent placement. Multiple attempts were made tocannulate the bile duct without success. The procedure was aborted.  08/16/2017 ERCP with placement of a metal biliary stent in the common bile duct by Dr. Francella Solian at [facility].  Cycle 1 FOLFIRINOX 08/31/2017  Cycle 2 FOLFIRINOX 09/14/2017  Cycle 3 FOLFIRINOX 09/28/2017  Cycle 4 FOLFIRINOX 10/12/2017 (oxaliplatin dose reduced secondary to thrombocytopenia)  Cycle 5 FOLFIRINOX 10/26/2017  Cycle 6 FOLFIRINOX 11/09/2017  Restaging CTs at [facility] 11/29/2017-no definitive evidence of distant metastatic disease. 2 subcentimeter liver lesions described on prior MRI not visualized on CT.  Ill-defined pancreatic head mass stable to decreased in size measuring 2.1 x 2 cm. Peripancreatic inflammatory stranding decreased from prior. No biliary ductal dilatation. Celiac axis less than 180 degrees abutment. Common hepatic artery with greater than 180 degrees encasement; superior mesenteric artery with short segment less  than 180 degrees abutment; portal vein/superior mesenteric vein with greater than 180 degrees encasement of the extrahepatic portal vein with associated circumferential narrowing at the portomesenteric, overall substantially improved from prior examination. Now less than 180 degree abutment of the SMV. The portal vein and SMV remain patent. Splenic vein patent.  Cycle 1 gemcitabine/Abraxane 12/06/2017  Cycle 2 gemcitabine/Abraxane 12/20/2017  Cycle 3 gemcitabine/Abraxane 01/03/2018  Cycle 4 gemcitabine/Abraxane 01/17/2018 2. Hypertension 3. Port-A-Cath placement 08/30/2017 4. History of elevated bilirubin-questionGilbert's syndrome 5. Mild leukopenia, thrombocytopenia-question secondary to portal hypertension/splenomegaly;09/14/2017 white count improved, platelet count stable;progressive thrombocytopenia following gemcitabine/Abraxane 6. Kidney stones 7. Fever following cycles 2 and 3 gemcitabine/Abraxane,?  Fever related to gemcitabine   Disposition: Cory Kelly appears unchanged.  He has completed 3 cycles of gemcitabine/Abraxane.  The CA 19-9 was lower on 01/03/2018.  He has developed a fever following the past few cycles of chemotherapy.  The fever may be related to gemcitabine, though the liver enzymes were elevated with the last fever.  The week interval between chemotherapy and the fever is also atypical for a gemcitabine fever.  [name] will complete another cycle of chemotherapy today.  He will contact us for a fever following this cycle.  He will seek medical attention for a persistent fever or other symptoms of an infection.  He will return for an office visit and chemotherapy in 2 weeks.  The plan is to obtain a restaging CT after 6 cycles of gemcitabine/Abraxane.  25 minutes were spent with the patient today.  The majority of the time was used for counseling and coordination of care.  Betsy Coder, MD  01/17/2018  10:23 AM

## 2018-01-18 ENCOUNTER — Encounter: Payer: Self-pay | Admitting: Oncology

## 2018-01-18 ENCOUNTER — Telehealth: Payer: Self-pay | Admitting: Physician Assistant

## 2018-01-18 ENCOUNTER — Other Ambulatory Visit: Payer: Self-pay

## 2018-01-18 DIAGNOSIS — C250 Malignant neoplasm of head of pancreas: Secondary | ICD-10-CM

## 2018-01-18 LAB — CANCER ANTIGEN 19-9: CA 19-9: 57 U/mL — ABNORMAL HIGH (ref 0–35)

## 2018-01-18 MED ORDER — OLMESARTAN MEDOXOMIL 20 MG PO TABS: 20.0000 mg | ORAL_TABLET | Freq: Every day | ORAL | 0 refills | Status: DC

## 2018-01-18 MED ORDER — TRAMADOL HCL 50 MG PO TABS: 50.0000 mg | ORAL_TABLET | Freq: Three times a day (TID) | ORAL | 0 refills | Status: DC | PRN

## 2018-01-18 NOTE — Telephone Encounter (Signed)
Pt needs more BP medications - due to recalls of losartan we will switch to benicar.

## 2018-01-19 ENCOUNTER — Inpatient Hospital Stay (HOSPITAL_BASED_OUTPATIENT_CLINIC_OR_DEPARTMENT_OTHER): Payer: BLUE CROSS/BLUE SHIELD | Admitting: Nurse Practitioner

## 2018-01-19 ENCOUNTER — Encounter: Payer: Self-pay | Admitting: Oncology

## 2018-01-19 ENCOUNTER — Telehealth: Payer: Self-pay | Admitting: Nurse Practitioner

## 2018-01-19 ENCOUNTER — Inpatient Hospital Stay: Payer: BLUE CROSS/BLUE SHIELD

## 2018-01-19 ENCOUNTER — Telehealth: Payer: Self-pay

## 2018-01-19 ENCOUNTER — Encounter: Payer: Self-pay | Admitting: Nurse Practitioner

## 2018-01-19 VITALS — BP 147/96 | HR 61 | Temp 98.0°F | Resp 17 | Ht 75.75 in | Wt 221.8 lb

## 2018-01-19 DIAGNOSIS — I10 Essential (primary) hypertension: Secondary | ICD-10-CM | POA: Diagnosis not present

## 2018-01-19 DIAGNOSIS — Z95828 Presence of other vascular implants and grafts: Secondary | ICD-10-CM

## 2018-01-19 DIAGNOSIS — R200 Anesthesia of skin: Secondary | ICD-10-CM | POA: Diagnosis not present

## 2018-01-19 DIAGNOSIS — R21 Rash and other nonspecific skin eruption: Secondary | ICD-10-CM

## 2018-01-19 DIAGNOSIS — C250 Malignant neoplasm of head of pancreas: Secondary | ICD-10-CM

## 2018-01-19 DIAGNOSIS — Z5111 Encounter for antineoplastic chemotherapy: Secondary | ICD-10-CM | POA: Diagnosis not present

## 2018-01-19 LAB — CBC WITH DIFFERENTIAL (CANCER CENTER ONLY)
BASOS ABS: 0 10*3/uL (ref 0.0–0.1)
BASOS PCT: 1 %
EOS ABS: 0.1 10*3/uL (ref 0.0–0.5)
Eosinophils Relative: 1 %
HEMATOCRIT: 34.6 % — AB (ref 38.4–49.9)
Hemoglobin: 11.9 g/dL — ABNORMAL LOW (ref 13.0–17.1)
Lymphocytes Relative: 13 %
Lymphs Abs: 0.5 10*3/uL — ABNORMAL LOW (ref 0.9–3.3)
MCH: 29.9 pg (ref 27.2–33.4)
MCHC: 34.3 g/dL (ref 32.0–36.0)
MCV: 87.2 fL (ref 79.3–98.0)
MONO ABS: 0.2 10*3/uL (ref 0.1–0.9)
Monocytes Relative: 6 %
NEUTROS ABS: 3 10*3/uL (ref 1.5–6.5)
Neutrophils Relative %: 79 %
PLATELETS: 157 10*3/uL (ref 140–400)
RBC: 3.97 MIL/uL — ABNORMAL LOW (ref 4.20–5.82)
RDW: 15.4 % — AB (ref 11.0–14.6)
WBC Count: 3.8 10*3/uL — ABNORMAL LOW (ref 4.0–10.3)

## 2018-01-19 LAB — CMP (CANCER CENTER ONLY)
ALBUMIN: 3.5 g/dL (ref 3.5–5.0)
ALK PHOS: 261 U/L — AB (ref 38–126)
ALT: 93 U/L — AB (ref 0–44)
ANION GAP: 5 (ref 5–15)
AST: 68 U/L — ABNORMAL HIGH (ref 15–41)
BILIRUBIN TOTAL: 2.6 mg/dL — AB (ref 0.3–1.2)
BUN: 10 mg/dL (ref 6–20)
CALCIUM: 8.6 mg/dL — AB (ref 8.9–10.3)
CO2: 27 mmol/L (ref 22–32)
CREATININE: 0.77 mg/dL (ref 0.61–1.24)
Chloride: 103 mmol/L (ref 98–111)
GFR, Estimated: >60 mL/min (ref >60)
GLUCOSE: 97 mg/dL (ref 70–99)
Potassium: 4.4 mmol/L (ref 3.5–5.1)
Sodium: 135 mmol/L (ref 135–145)
TOTAL PROTEIN: 6.2 g/dL — AB (ref 6.5–8.1)

## 2018-01-19 MED ORDER — GABAPENTIN 100 MG PO CAPS: 100.0000 mg | ORAL_CAPSULE | Freq: Two times a day (BID) | ORAL | 0 refills | Status: DC

## 2018-01-19 MED ORDER — SODIUM CHLORIDE 0.9% FLUSH
10.0000 mL | Freq: Once | INTRAVENOUS | Status: AC
  Administered 2018-01-19: 10 mL
  Filled 2018-01-19: qty 10

## 2018-01-19 MED ORDER — HEPARIN SOD (PORK) LOCK FLUSH 100 UNIT/ML IV SOLN
500.0000 [IU] | Freq: Once | INTRAVENOUS | Status: AC
  Administered 2018-01-19: 500 [IU]
  Filled 2018-01-19: qty 5

## 2018-01-19 NOTE — Telephone Encounter (Signed)
I contacted Mr. Baston to review the chemistry panel results, specifically elevated LFTs.  He understands to seek evaluation if he develops fever, chills.

## 2018-01-19 NOTE — Progress Notes (Signed)
[facility] OFFICE PROGRESS NOTE   Diagnosis: Pancreas cancer  INTERVAL HISTORY:   Mr. Parenteau returns prior to scheduled follow-up for evaluation of redness over his chest.  He reports that after taking a "hot shower" this morning his wife noted redness over his chest.  This has faded.  He denies pain associated with the Port-A-Cath except when it was accessed for treatment of few days ago.  No fever or shaking chills.  No nausea or vomiting though he does note that his abdomen feels "uncomfortable" after treatment.  No mouth sores.  No diarrhea.  He has fairly consistent numbness in his feet with associated pain.  He would like to try Neurontin.  Objective:  Vital signs in last 24 hours:  Blood pressure (!) 147/96, pulse 61, temperature 98 F (36.7 C), temperature source Oral, resp. rate 17, height 6' 3.75" (1.924 m), weight 221 lb 12.8 oz (100.6 kg), SpO2 99 %.    HEENT: No thrush or ulcers. Resp: Lungs clear bilaterally. Cardio: Regular rate and rhythm. GI: Abdomen soft and nontender.  No hepatomegaly. Vascular: No leg edema.  Skin: Mildly erythematous papular rash at the right chest wall in the area of the Port-A-Cath.  Rash is present in a distribution consistent with the Port-A-Cath dressing. Port-A-Cath nontender.   Lab Results:  Lab Results  Component Value Date   WBC 3.8 (L) 01/19/2018   HGB 11.9 (L) 01/19/2018   HCT 34.6 (L) 01/19/2018   MCV 87.2 01/19/2018   PLT 157 01/19/2018   NEUTROABS 3.0 01/19/2018    Imaging:  No results found.  Medications: I have reviewed the patient's current medications.  Assessment/Plan: 1. Pancreas head mass  CT abdomen/pelvis 08/03/2017-fullness in the head of the pancreas with stranding in the peripancreatic fat and pancreatic ductal dilatation.   MRI abdomen 08/04/2017-findings favored to represent acute on chronic pancreatitis; equivocal soft tissue fullness within the head and uncinate process of the  pancreas; indeterminate too small to characterize right hepatic lobe lesion; small volume abdominal ascites; splenomegaly.   CA-19-9 elevated at 977on 08/04/2017.  Status postupper EUS 08/10/2017-findings of an irregular masslike region in the pancreatic head measuring approximately 2.7 cm. This directly abutted the main portal vein but no other major vascular structures. Biopsies obtained with preliminary cytology positive for malignancy, likely adenocarcinoma. The final report is pending. The common bile duct and main pancreatic duct were both dilated. ERCP was then proceeded with forstent placement. Multiple attempts were made tocannulate the bile duct without success. The procedure was aborted.  08/16/2017 ERCP with placement of a metal biliary stent in the common bile duct by Dr. Francella Solian at [facility].  Cycle 1 FOLFIRINOX 08/31/2017  Cycle 2 FOLFIRINOX 09/14/2017  Cycle 3 FOLFIRINOX 09/28/2017  Cycle 4 FOLFIRINOX 10/12/2017 (oxaliplatin dose reduced secondary to thrombocytopenia)  Cycle 5 FOLFIRINOX 10/26/2017  Cycle 6 FOLFIRINOX 11/09/2017  Restaging CTs at [facility] 11/29/2017-no definitive evidence of distant metastatic disease. 2 subcentimeter liver lesions described on prior MRI not visualized on CT. Ill-defined pancreatic head mass stable to decreased in size measuring 2.1 x 2 cm. Peripancreatic inflammatory stranding decreased from prior. No biliary ductal dilatation. Celiac axis less than 180 degrees abutment. Common hepatic artery with greater than 180 degrees encasement; superior mesenteric artery with short segment less than 180 degrees abutment; portal vein/superior mesenteric vein with greater than 180 degrees encasement of the extrahepatic portal vein with associated circumferential narrowing at the portomesenteric, overall substantially improved from prior examination. Now less than 180 degree abutment of  the SMV. The portal vein and SMV remain patent. Splenic vein  patent.  Cycle 1 gemcitabine/Abraxane 12/06/2017  Cycle 2 gemcitabine/Abraxane 12/20/2017  Cycle 3 gemcitabine/Abraxane 01/03/2018  Cycle 4 gemcitabine/Abraxane 01/17/2018 2. Hypertension 3. Port-A-Cath placement 08/30/2017 4. History of elevated bilirubin-questionGilbert's syndrome 5. Mild leukopenia, thrombocytopenia-question secondary to portal hypertension/splenomegaly;09/14/2017 white count improved, platelet count stable;progressive thrombocytopenia following gemcitabine/Abraxane 6. Kidney stones 7. Fever following cycles 2 and 3 gemcitabine/Abraxane,?  Fever related to gemcitabine   Disposition: Mr. Monier appears stable.  He completed cycle 4 gemcitabine/Abraxane 01/17/2018.  He had erythema over the chest wall after a shower earlier this morning.  This has resolved.  He has a faint rash surrounding the Port-A-Cath consistent with a contact dermatitis likely related to either the Port-A-Cath dressing or cleansing solution.  He will keep the area clean and dry.  A different Port-A-Cath dressing will be utilized with his next treatment.  He understands to contact the office if the rash worsens, he develops pain around the Port-A-Cath, fever, chills.  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  He will begin Neurontin 100 mg twice daily.  We discussed the potential for drowsiness.  He understands he should not be driving.  He will return for lab, follow-up and the next cycle of gemcitabine/Abraxane as scheduled on 01/31/2018.  He will contact the office in the interim as outlined above or with any other problems.    Ned Card ANP/GNP-BC   01/19/2018  12:59 PM

## 2018-01-19 NOTE — Telephone Encounter (Signed)
Called pt regarding MyChart message regarding port redness. Per Ned Card pt to be seen in office to evaluate port. Pt voiced understanding and agreeable to plan. Appts scheduled

## 2018-01-24 LAB — CULTURE, BLOOD (SINGLE)
Culture: NO GROWTH
Culture: NO GROWTH
SPECIAL REQUESTS: ADEQUATE
SPECIAL REQUESTS: ADEQUATE

## 2018-01-26 ENCOUNTER — Other Ambulatory Visit: Payer: Self-pay | Admitting: Physician Assistant

## 2018-01-28 ENCOUNTER — Other Ambulatory Visit: Payer: Self-pay | Admitting: Oncology

## 2018-01-31 ENCOUNTER — Encounter: Payer: Self-pay | Admitting: Nurse Practitioner

## 2018-01-31 ENCOUNTER — Inpatient Hospital Stay: Payer: BLUE CROSS/BLUE SHIELD

## 2018-01-31 ENCOUNTER — Inpatient Hospital Stay (HOSPITAL_BASED_OUTPATIENT_CLINIC_OR_DEPARTMENT_OTHER): Payer: BLUE CROSS/BLUE SHIELD | Admitting: Nurse Practitioner

## 2018-01-31 ENCOUNTER — Other Ambulatory Visit: Payer: Self-pay | Admitting: Physician Assistant

## 2018-01-31 VITALS — BP 149/95 | HR 60 | Temp 98.4°F | Resp 18 | Ht 75.75 in | Wt 223.7 lb

## 2018-01-31 DIAGNOSIS — R200 Anesthesia of skin: Secondary | ICD-10-CM

## 2018-01-31 DIAGNOSIS — C250 Malignant neoplasm of head of pancreas: Secondary | ICD-10-CM

## 2018-01-31 DIAGNOSIS — Z5111 Encounter for antineoplastic chemotherapy: Secondary | ICD-10-CM | POA: Diagnosis not present

## 2018-01-31 DIAGNOSIS — Z95828 Presence of other vascular implants and grafts: Secondary | ICD-10-CM

## 2018-01-31 DIAGNOSIS — I10 Essential (primary) hypertension: Secondary | ICD-10-CM | POA: Diagnosis not present

## 2018-01-31 DIAGNOSIS — R21 Rash and other nonspecific skin eruption: Secondary | ICD-10-CM | POA: Diagnosis not present

## 2018-01-31 LAB — CBC WITH DIFFERENTIAL (CANCER CENTER ONLY)
BASOS PCT: 1 %
Basophils Absolute: 0 10*3/uL (ref 0.0–0.1)
EOS ABS: 0 10*3/uL (ref 0.0–0.5)
EOS PCT: 1 %
HCT: 33 % — ABNORMAL LOW (ref 38.4–49.9)
Hemoglobin: 11.3 g/dL — ABNORMAL LOW (ref 13.0–17.1)
LYMPHS ABS: 0.6 10*3/uL — AB (ref 0.9–3.3)
Lymphocytes Relative: 12 %
MCH: 29.7 pg (ref 27.2–33.4)
MCHC: 34.3 g/dL (ref 32.0–36.0)
MCV: 86.8 fL (ref 79.3–98.0)
Monocytes Absolute: 0.7 10*3/uL (ref 0.1–0.9)
Monocytes Relative: 14 %
NEUTROS PCT: 72 %
Neutro Abs: 3.8 10*3/uL (ref 1.5–6.5)
PLATELETS: 106 10*3/uL — AB (ref 140–400)
RBC: 3.8 MIL/uL — AB (ref 4.20–5.82)
RDW: 15.7 % — ABNORMAL HIGH (ref 11.0–14.6)
WBC Count: 5.2 10*3/uL (ref 4.0–10.3)

## 2018-01-31 LAB — CMP (CANCER CENTER ONLY)
ALBUMIN: 3.6 g/dL (ref 3.5–5.0)
ALK PHOS: 259 U/L — AB (ref 38–126)
ALT: 75 U/L — ABNORMAL HIGH (ref 0–44)
ANION GAP: 6 (ref 5–15)
AST: 44 U/L — ABNORMAL HIGH (ref 15–41)
BILIRUBIN TOTAL: 1.4 mg/dL — AB (ref 0.3–1.2)
BUN: 11 mg/dL (ref 6–20)
CALCIUM: 8.8 mg/dL — AB (ref 8.9–10.3)
CO2: 28 mmol/L (ref 22–32)
Chloride: 104 mmol/L (ref 98–111)
Creatinine: 0.8 mg/dL (ref 0.61–1.24)
GFR, Est AFR Am: >60 mL/min (ref >60)
GFR, Estimated: >60 mL/min (ref >60)
GLUCOSE: 85 mg/dL (ref 70–99)
Potassium: 4.3 mmol/L (ref 3.5–5.1)
Sodium: 138 mmol/L (ref 135–145)
TOTAL PROTEIN: 6.2 g/dL — AB (ref 6.5–8.1)

## 2018-01-31 MED ORDER — HEPARIN SOD (PORK) LOCK FLUSH 100 UNIT/ML IV SOLN
500.0000 [IU] | Freq: Once | INTRAVENOUS | Status: AC | PRN
Start: 2018-01-31 — End: 2018-01-31
  Administered 2018-01-31: 500 [IU]
  Filled 2018-01-31: qty 5

## 2018-01-31 MED ORDER — SODIUM CHLORIDE 0.9% FLUSH
10.0000 mL | INTRAVENOUS | Status: DC | PRN
  Administered 2018-01-31: 10 mL
  Filled 2018-01-31: qty 10

## 2018-01-31 MED ORDER — SODIUM CHLORIDE 0.9 % IV SOLN
800.0000 mg/m2 | Freq: Once | INTRAVENOUS | Status: AC
  Administered 2018-01-31: 1786 mg via INTRAVENOUS
  Filled 2018-01-31: qty 46.97

## 2018-01-31 MED ORDER — SODIUM CHLORIDE 0.9% FLUSH
10.0000 mL | Freq: Once | INTRAVENOUS | Status: AC
  Administered 2018-01-31: 10 mL
  Filled 2018-01-31: qty 10

## 2018-01-31 MED ORDER — PROCHLORPERAZINE MALEATE 10 MG PO TABS
10.0000 mg | ORAL_TABLET | Freq: Once | ORAL | Status: AC
  Administered 2018-01-31: 10 mg via ORAL

## 2018-01-31 MED ORDER — PROCHLORPERAZINE MALEATE 10 MG PO TABS
ORAL_TABLET | ORAL | Status: AC
  Filled 2018-01-31: qty 1

## 2018-01-31 MED ORDER — SODIUM CHLORIDE 0.9 % IV SOLN
Freq: Once | INTRAVENOUS | Status: AC
  Administered 2018-01-31: 13:00:00 via INTRAVENOUS
  Filled 2018-01-31: qty 250

## 2018-01-31 MED ORDER — OLMESARTAN MEDOXOMIL 20 MG PO TABS: 20.0000 mg | ORAL_TABLET | Freq: Every day | ORAL | 0 refills | Status: DC

## 2018-01-31 NOTE — Progress Notes (Signed)
[place] OFFICE PROGRESS NOTE   Diagnosis: Pancreas cancer  INTERVAL HISTORY:   Cory Kelly returns as scheduled.  He completed cycle 4 gemcitabine/Abraxane 01/17/2018.  He denies nausea/vomiting.  No mouth sores.  No diarrhea.  No rash.  No fever.  He noted no improvement in the painful numbness in his feet with a trial of gabapentin.  He has discontinued gabapentin.  Feet feel numb consistently.  He feels "off balance" at times.  No significant numbness in the hands.    Objective:  Vital signs in last 24 hours:  Blood pressure (!) 149/95, pulse 60, temperature 98.4 F (36.9 C), temperature source Oral, resp. rate 18, height 6' 3.75" (1.924 m), weight 223 lb 11.2 oz (101.5 kg), SpO2 100 %.    HEENT: No thrush or ulcers. Resp: Lungs clear bilaterally. Cardio: Regular rate and rhythm. GI: Abdomen soft and nontender.  No hepatomegaly. Vascular: No leg edema. Neuro: Vibratory sense intact over the fingertips per tuning fork exam. Skin: No rash. Port-A-Cath without erythema.   Lab Results:  Lab Results  Component Value Date   WBC 5.2 01/31/2018   HGB 11.3 (L) 01/31/2018   HCT 33.0 (L) 01/31/2018   MCV 86.8 01/31/2018   PLT 106 (L) 01/31/2018   NEUTROABS 3.8 01/31/2018    Imaging:  No results found.  Medications: I have reviewed the patient's current medications.  Assessment/Plan: 1. Pancreas head mass  CT abdomen/pelvis 08/03/2017-fullness in the head of the pancreas with stranding in the peripancreatic fat and pancreatic ductal dilatation.   MRI abdomen 08/04/2017-findings favored to represent acute on chronic pancreatitis; equivocal soft tissue fullness within the head and uncinate process of the pancreas; indeterminate too small to characterize right hepatic lobe lesion; small volume abdominal ascites; splenomegaly.   CA-19-9 elevated at 977on 08/04/2017.  Status postupper EUS 08/10/2017-findings of an irregular masslike region in the pancreatic  head measuring approximately 2.7 cm. This directly abutted the main portal vein but no other major vascular structures. Biopsies obtained with preliminary cytology positive for malignancy, likely adenocarcinoma. The final report is pending. The common bile duct and main pancreatic duct were both dilated. ERCP was then proceeded with forstent placement. Multiple attempts were made tocannulate the bile duct without success. The procedure was aborted.  08/16/2017 ERCP with placement of a metal biliary stent in the common bile duct by Dr. Francella Solian at [facility].  Cycle 1 FOLFIRINOX 08/31/2017  Cycle 2 FOLFIRINOX 09/14/2017  Cycle 3 FOLFIRINOX 09/28/2017  Cycle 4 FOLFIRINOX 10/12/2017 (oxaliplatin dose reduced secondary to thrombocytopenia)  Cycle 5 FOLFIRINOX 10/26/2017  Cycle 6 FOLFIRINOX 11/09/2017  Restaging CTs at [facility] 11/29/2017-no definitive evidence of distant metastatic disease. 2 subcentimeter liver lesions described on prior MRI not visualized on CT. Ill-defined pancreatic head mass stable to decreased in size measuring 2.1 x 2 cm. Peripancreatic inflammatory stranding decreased from prior. No biliary ductal dilatation. Celiac axis less than 180 degrees abutment. Common hepatic artery with greater than 180 degrees encasement; superior mesenteric artery with short segment less than 180 degrees abutment; portal vein/superior mesenteric vein with greater than 180 degrees encasement of the extrahepatic portal vein with associated circumferential narrowing at the portomesenteric, overall substantially improved from prior examination. Now less than 180 degree abutment of the SMV. The portal vein and SMV remain patent. Splenic vein patent.  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 Hypertension  3. Port-A-Cath placement 08/30/2017 4. History of elevated bilirubin-questionGilbert's syndrome 5. Mild leukopenia, thrombocytopenia-question secondary to portal hypertension/splenomegaly;09/14/2017 white count improved, platelet count stable;progressive thrombocytopenia following gemcitabine/Abraxane 6. Kidney stones 7. Fever following cycles 2 and 3 gemcitabine/Abraxane,? Fever related to gemcitabine     Disposition: Cory Kelly appears stable.  He has completed 4 cycles of gemcitabine/Abraxane.  He has persistent numbness in the feet.  We decided to hold Abraxane with today's treatment and proceed with gemcitabine alone.  He will return for lab, follow-up and the next cycle of chemotherapy in 2 weeks.  We will consider resuming Abraxane at that time pending the neuropathy symptoms.  Plan reviewed with Dr. Benay Spice.    Ned Card ANP/GNP-BC   01/31/2018  12:58 PM

## 2018-01-31 NOTE — Patient Instructions (Signed)
[facility] °Discharge Instructions for Patients Receiving Chemotherapy ° °Today you received the following chemotherapy agents Gemzar ° °To help prevent nausea and vomiting after your treatment, we encourage you to take your nausea medication as directed. °  °If you develop nausea and vomiting that is not controlled by your nausea medication, call the clinic.  ° °BELOW ARE SYMPTOMS THAT SHOULD BE REPORTED IMMEDIATELY: °· *FEVER GREATER THAN 100.5 F °· *CHILLS WITH OR WITHOUT FEVER °· NAUSEA AND VOMITING THAT IS NOT CONTROLLED WITH YOUR NAUSEA MEDICATION °· *UNUSUAL SHORTNESS OF BREATH °· *UNUSUAL BRUISING OR BLEEDING °· TENDERNESS IN MOUTH AND THROAT WITH OR WITHOUT PRESENCE OF ULCERS °· *URINARY PROBLEMS °· *BOWEL PROBLEMS °· UNUSUAL RASH °Items with * indicate a potential emergency and should be followed up as soon as possible. ° °Feel free to call the clinic should you have any questions or concerns. The clinic phone number is [phone]. ° °Please show the CHEMO ALERT CARD at check-in to the Emergency Department and triage nurse. ° ° °

## 2018-02-01 DIAGNOSIS — C250 Malignant neoplasm of head of pancreas: Secondary | ICD-10-CM | POA: Diagnosis not present

## 2018-02-02 LAB — CANCER ANTIGEN 19-9: CA 19-9: 60 U/mL — ABNORMAL HIGH (ref 0–35)

## 2018-02-11 ENCOUNTER — Other Ambulatory Visit: Payer: Self-pay | Admitting: Oncology

## 2018-02-14 ENCOUNTER — Inpatient Hospital Stay: Payer: BLUE CROSS/BLUE SHIELD

## 2018-02-14 ENCOUNTER — Inpatient Hospital Stay: Payer: BLUE CROSS/BLUE SHIELD | Attending: Nurse Practitioner

## 2018-02-14 ENCOUNTER — Inpatient Hospital Stay (HOSPITAL_BASED_OUTPATIENT_CLINIC_OR_DEPARTMENT_OTHER): Payer: BLUE CROSS/BLUE SHIELD | Admitting: Nurse Practitioner

## 2018-02-14 ENCOUNTER — Encounter: Payer: Self-pay | Admitting: Nurse Practitioner

## 2018-02-14 ENCOUNTER — Telehealth: Payer: Self-pay | Admitting: Nurse Practitioner

## 2018-02-14 DIAGNOSIS — Z95828 Presence of other vascular implants and grafts: Secondary | ICD-10-CM

## 2018-02-14 DIAGNOSIS — R200 Anesthesia of skin: Secondary | ICD-10-CM | POA: Insufficient documentation

## 2018-02-14 DIAGNOSIS — C250 Malignant neoplasm of head of pancreas: Secondary | ICD-10-CM

## 2018-02-14 DIAGNOSIS — Z5111 Encounter for antineoplastic chemotherapy: Secondary | ICD-10-CM | POA: Diagnosis not present

## 2018-02-14 DIAGNOSIS — I10 Essential (primary) hypertension: Secondary | ICD-10-CM | POA: Diagnosis not present

## 2018-02-14 LAB — CBC WITH DIFFERENTIAL (CANCER CENTER ONLY)
Basophils Absolute: 0 10*3/uL (ref 0.0–0.1)
Basophils Relative: 1 %
EOS ABS: 0.1 10*3/uL (ref 0.0–0.5)
EOS PCT: 2 %
HCT: 34.3 % — ABNORMAL LOW (ref 38.4–49.9)
Hemoglobin: 11.6 g/dL — ABNORMAL LOW (ref 13.0–17.1)
Lymphocytes Relative: 16 %
Lymphs Abs: 0.6 10*3/uL — ABNORMAL LOW (ref 0.9–3.3)
MCH: 29.9 pg (ref 27.2–33.4)
MCHC: 33.8 g/dL (ref 32.0–36.0)
MCV: 88.4 fL (ref 79.3–98.0)
MONO ABS: 0.3 10*3/uL (ref 0.1–0.9)
MONOS PCT: 7 %
Neutro Abs: 2.9 10*3/uL (ref 1.5–6.5)
Neutrophils Relative %: 74 %
PLATELETS: 93 10*3/uL — AB (ref 140–400)
RBC: 3.88 MIL/uL — ABNORMAL LOW (ref 4.20–5.82)
RDW: 15.3 % — AB (ref 11.0–14.6)
WBC Count: 4 10*3/uL (ref 4.0–10.3)

## 2018-02-14 LAB — CMP (CANCER CENTER ONLY)
ALK PHOS: 196 U/L — AB (ref 38–126)
ALT: 38 U/L (ref 0–44)
AST: 25 U/L (ref 15–41)
Albumin: 3.5 g/dL (ref 3.5–5.0)
Anion gap: 10 (ref 5–15)
BUN: 11 mg/dL (ref 6–20)
CALCIUM: 8.5 mg/dL — AB (ref 8.9–10.3)
CO2: 26 mmol/L (ref 22–32)
CREATININE: 0.92 mg/dL (ref 0.61–1.24)
Chloride: 106 mmol/L (ref 98–111)
GFR, Est AFR Am: >60 mL/min (ref >60)
Glucose, Bld: 145 mg/dL — ABNORMAL HIGH (ref 70–99)
Potassium: 4 mmol/L (ref 3.5–5.1)
SODIUM: 142 mmol/L (ref 135–145)
Total Bilirubin: 1.4 mg/dL — ABNORMAL HIGH (ref 0.3–1.2)
Total Protein: 5.9 g/dL — ABNORMAL LOW (ref 6.5–8.1)

## 2018-02-14 MED ORDER — POTASSIUM CHLORIDE ER 10 MEQ PO CPCR: ORAL_CAPSULE | ORAL | 0 refills | Status: DC

## 2018-02-14 MED ORDER — PROCHLORPERAZINE MALEATE 10 MG PO TABS
10.0000 mg | ORAL_TABLET | Freq: Once | ORAL | Status: AC
  Administered 2018-02-14: 10 mg via ORAL

## 2018-02-14 MED ORDER — SODIUM CHLORIDE 0.9 % IV SOLN
Freq: Once | INTRAVENOUS | Status: AC
  Administered 2018-02-14: 11:00:00 via INTRAVENOUS
  Filled 2018-02-14: qty 250

## 2018-02-14 MED ORDER — SODIUM CHLORIDE 0.9% FLUSH
10.0000 mL | Freq: Once | INTRAVENOUS | Status: AC
  Administered 2018-02-14: 10 mL
  Filled 2018-02-14: qty 10

## 2018-02-14 MED ORDER — SODIUM CHLORIDE 0.9 % IV SOLN
800.0000 mg/m2 | Freq: Once | INTRAVENOUS | Status: AC
  Administered 2018-02-14: 1786 mg via INTRAVENOUS
  Filled 2018-02-14: qty 47

## 2018-02-14 MED ORDER — HEPARIN SOD (PORK) LOCK FLUSH 100 UNIT/ML IV SOLN
500.0000 [IU] | Freq: Once | INTRAVENOUS | Status: AC | PRN
  Administered 2018-02-14: 500 [IU]
  Filled 2018-02-14: qty 5

## 2018-02-14 MED ORDER — SODIUM CHLORIDE 0.9% FLUSH
10.0000 mL | INTRAVENOUS | Status: DC | PRN
  Administered 2018-02-14: 10 mL
  Filled 2018-02-14: qty 10

## 2018-02-14 MED ORDER — PROCHLORPERAZINE MALEATE 10 MG PO TABS
ORAL_TABLET | ORAL | Status: AC
  Filled 2018-02-14: qty 1

## 2018-02-14 NOTE — Progress Notes (Signed)
Per Ned Card, NP ok to treat with 02/14/18 lab results.

## 2018-02-14 NOTE — Telephone Encounter (Signed)
No 8/14 los.

## 2018-02-14 NOTE — Progress Notes (Addendum)
[place] OFFICE PROGRESS NOTE   Diagnosis: Pancreas cancer  INTERVAL HISTORY:   Cory Kelly returns as scheduled.  He completed cycle 5 gemcitabine/Abraxane 01/31/2018.  He denies nausea/vomiting.  No mouth sores.  No diarrhea.  No rash.  No fever.  He continues to have painful numbness in the feet.  The numbness is present continuously.  The pain is intermittent.  He thinks symptoms are similar to worse.  He notes mild tingling in the fingertips.  Objective:  Vital signs in last 24 hours:  Blood pressure (!) 165/98, pulse (!) 53, temperature 97.7 F (36.5 C), temperature source Oral, resp. rate 18, height 6' 3.75" (1.924 m), weight 225 lb 9.6 oz (102.3 kg), SpO2 100 %.    HEENT: No thrush or ulcers. Resp: Lungs clear bilaterally. Cardio: Regular rate and rhythm. GI: Abdomen soft and nontender.  No hepatomegaly. Vascular: No leg edema.  Skin: No rash. Port-A-Cath without erythema.   Lab Results:  Lab Results  Component Value Date   WBC 4.0 02/14/2018   HGB 11.6 (L) 02/14/2018   HCT 34.3 (L) 02/14/2018   MCV 88.4 02/14/2018   PLT 93 (L) 02/14/2018   NEUTROABS 2.9 02/14/2018    Imaging:  No results found.  Medications: I have reviewed the patient's current medications.  Assessment/Plan: 1. Pancreas head mass  CT abdomen/pelvis 08/03/2017-fullness in the head of the pancreas with stranding in the peripancreatic fat and pancreatic ductal dilatation.   MRI abdomen 08/04/2017-findings favored to represent acute on chronic pancreatitis; equivocal soft tissue fullness within the head and uncinate process of the pancreas; indeterminate too small to characterize right hepatic lobe lesion; small volume abdominal ascites; splenomegaly.   CA-19-9 elevated at 977on 08/04/2017.  Status postupper EUS 08/10/2017-findings of an irregular masslike region in the pancreatic head measuring approximately 2.7 cm. This directly abutted the main portal vein but no other  major vascular structures. Biopsies obtained with preliminary cytology positive for malignancy, likely adenocarcinoma. The final report is pending. The common bile duct and main pancreatic duct were both dilated. ERCP was then proceeded with forstent placement. Multiple attempts were made tocannulate the bile duct without success. The procedure was aborted.  08/16/2017 ERCP with placement of a metal biliary stent in the common bile duct by Dr. Francella Solian at [facility].  Cycle 1 FOLFIRINOX 08/31/2017  Cycle 2 FOLFIRINOX 09/14/2017  Cycle 3 FOLFIRINOX 09/28/2017  Cycle 4 FOLFIRINOX 10/12/2017 (oxaliplatin dose reduced secondary to thrombocytopenia)  Cycle 5 FOLFIRINOX 10/26/2017  Cycle 6 FOLFIRINOX 11/09/2017  Restaging CTs at [facility] 11/29/2017-no definitive evidence of distant metastatic disease. 2 subcentimeter liver lesions described on prior MRI not visualized on CT. Ill-defined pancreatic head mass stable to decreased in size measuring 2.1 x 2 cm. Peripancreatic inflammatory stranding decreased from prior. No biliary ductal dilatation. Celiac axis less than 180 degrees abutment. Common hepatic artery with greater than 180 degrees encasement; superior mesenteric artery with short segment less than 180 degrees abutment; portal vein/superior mesenteric vein with greater than 180 degrees encasement of the extrahepatic portal vein with associated circumferential narrowing at the portomesenteric, overall substantially improved from prior examination. Now less than 180 degree abutment of the SMV. The portal vein and SMV remain patent. Splenic vein patent.  Cycle 1 gemcitabine/Abraxane 12/06/2017  Cycle 2 gemcitabine/Abraxane 12/20/2017  Cycle 3 gemcitabine/Abraxane 01/03/2018  Cycle 4 gemcitabine/Abraxane 01/17/2018  Cycle 5 gemcitabine 01/31/2018 (Abraxane held due to neuropathy)  Cycle 6 gemcitabine 02/14/2018 (Abraxane held due to neuropathy) 2. Hypertension 3. Port-A-Cath placement  08/30/2017 4.  History of elevated bilirubin-questionGilbert's syndrome 5. Mild leukopenia, thrombocytopenia-question secondary to portal hypertension/splenomegaly;09/14/2017 white count improved, platelet count stable;progressive thrombocytopenia following gemcitabine/Abraxane 6. Kidney stones 7. Fever following cycles 2 and 3 gemcitabine/Abraxane,? Fever related to gemcitabine   Disposition: Cory Kelly appears stable.  He has completed 5 cycles of gemcitabine/Abraxane.  Abraxane was held with cycle 5 due to neuropathy symptoms.  The neuropathy symptoms persist.  Continue to hold Abraxane.  Plan to proceed with gemcitabine alone today.  We reviewed the CBC from today.  He has mild thrombocytopenia.  He understands to contact the office with any bleeding.  He is scheduled for restaging scans at [facility] on 03/08/2018.  He will return for lab and follow-up here in 2 weeks.  He will contact the office in the interim as outlined above or with any other problems.  Patient seen with Dr. Benay Spice.  25 minutes were spent face-to-face at today's visit with the majority of that time involved in counseling/coordination of care.   Ned Card ANP/GNP-BC   02/14/2018  10:40 AM  This was a shared visit with Ned Card.  Cory Kelly has developed progressive neuropathy symptoms.  Abraxane will remain on hold.  We will coordinate further chemotherapy with the radiation oncology and surgical team at [facility].  Julieanne Manson, MD

## 2018-02-14 NOTE — Patient Instructions (Signed)
[facility] Discharge Instructions for Patients Receiving Chemotherapy  Today you received the following chemotherapy agents: Gemcitabine (Gemzar)  To help prevent nausea and vomiting after your treatment, we encourage you to take your nausea medication as prescribed.    If you develop nausea and vomiting that is not controlled by your nausea medication, call the clinic.   BELOW ARE SYMPTOMS THAT SHOULD BE REPORTED IMMEDIATELY:  *FEVER GREATER THAN 100.5 F  *CHILLS WITH OR WITHOUT FEVER  NAUSEA AND VOMITING THAT IS NOT CONTROLLED WITH YOUR NAUSEA MEDICATION  *UNUSUAL SHORTNESS OF BREATH  *UNUSUAL BRUISING OR BLEEDING  TENDERNESS IN MOUTH AND THROAT WITH OR WITHOUT PRESENCE OF ULCERS  *URINARY PROBLEMS  *BOWEL PROBLEMS  UNUSUAL RASH Items with * indicate a potential emergency and should be followed up as soon as possible.  Feel free to call the clinic should you have any questions or concerns. The clinic phone number is [phone].  Please show the CHEMO ALERT CARD at check-in to the Emergency Department and triage nurse.   

## 2018-02-15 ENCOUNTER — Telehealth: Payer: Self-pay | Admitting: Emergency Medicine

## 2018-02-15 ENCOUNTER — Other Ambulatory Visit: Payer: Self-pay | Admitting: Emergency Medicine

## 2018-02-15 LAB — CANCER ANTIGEN 19-9: CAN 19-9: 34 U/mL (ref 0–35)

## 2018-02-15 NOTE — Progress Notes (Signed)
error 

## 2018-02-15 NOTE — Telephone Encounter (Signed)
-----   Message from Owens Shark, NP sent at 02/15/2018  2:45 PM EDT ----- Please let Mr. Colucci know we tried to contact the radiation oncologist at [facility] and were told she is out of the country until the end of the month.  Dr. Benay Spice would like him to follow-up as scheduled here.

## 2018-02-15 NOTE — Telephone Encounter (Addendum)
Called Dr.Palta's office to page Dr.Sherrill. No answer, VM left with call back info and Dr.Sherrills's pager number.   ----- Message from Owens Shark, NP sent at 02/14/2018  1:56 PM EDT ----- Vale Haven, MD  [facility] [address] [place], [place] [address]  Phone: [phone]  Fax: [phone]    Please contact Dr. Janine Ores office and request she page Dr. Benay Spice to discuss Mr. Piatek case.

## 2018-02-16 ENCOUNTER — Other Ambulatory Visit: Payer: Self-pay

## 2018-02-16 DIAGNOSIS — C250 Malignant neoplasm of head of pancreas: Secondary | ICD-10-CM

## 2018-02-16 MED ORDER — GABAPENTIN 100 MG PO CAPS: 100.0000 mg | ORAL_CAPSULE | Freq: Two times a day (BID) | ORAL | 0 refills | Status: DC

## 2018-02-25 ENCOUNTER — Other Ambulatory Visit: Payer: Self-pay | Admitting: Oncology

## 2018-02-25 ENCOUNTER — Encounter: Payer: Self-pay | Admitting: Oncology

## 2018-02-28 ENCOUNTER — Inpatient Hospital Stay (HOSPITAL_BASED_OUTPATIENT_CLINIC_OR_DEPARTMENT_OTHER): Payer: BLUE CROSS/BLUE SHIELD | Admitting: Oncology

## 2018-02-28 ENCOUNTER — Other Ambulatory Visit: Payer: Self-pay

## 2018-02-28 ENCOUNTER — Inpatient Hospital Stay: Payer: BLUE CROSS/BLUE SHIELD

## 2018-02-28 VITALS — BP 163/90 | HR 50 | Temp 98.1°F | Resp 18 | Ht 75.75 in | Wt 228.1 lb

## 2018-02-28 DIAGNOSIS — C250 Malignant neoplasm of head of pancreas: Secondary | ICD-10-CM

## 2018-02-28 DIAGNOSIS — I10 Essential (primary) hypertension: Secondary | ICD-10-CM | POA: Diagnosis not present

## 2018-02-28 DIAGNOSIS — Z5111 Encounter for antineoplastic chemotherapy: Secondary | ICD-10-CM | POA: Diagnosis not present

## 2018-02-28 DIAGNOSIS — Z95828 Presence of other vascular implants and grafts: Secondary | ICD-10-CM

## 2018-02-28 DIAGNOSIS — R200 Anesthesia of skin: Secondary | ICD-10-CM | POA: Diagnosis not present

## 2018-02-28 LAB — CMP (CANCER CENTER ONLY)
ALT: 22 U/L (ref 0–44)
ANION GAP: 8 (ref 5–15)
AST: 18 U/L (ref 15–41)
Albumin: 3.6 g/dL (ref 3.5–5.0)
Alkaline Phosphatase: 148 U/L — ABNORMAL HIGH (ref 38–126)
BUN: 14 mg/dL (ref 6–20)
CHLORIDE: 106 mmol/L (ref 98–111)
CO2: 28 mmol/L (ref 22–32)
Calcium: 9.1 mg/dL (ref 8.9–10.3)
Creatinine: 0.99 mg/dL (ref 0.61–1.24)
Glucose, Bld: 114 mg/dL — ABNORMAL HIGH (ref 70–99)
POTASSIUM: 4.2 mmol/L (ref 3.5–5.1)
Sodium: 142 mmol/L (ref 135–145)
Total Bilirubin: 1.3 mg/dL — ABNORMAL HIGH (ref 0.3–1.2)
Total Protein: 6.1 g/dL — ABNORMAL LOW (ref 6.5–8.1)

## 2018-02-28 LAB — CBC WITH DIFFERENTIAL (CANCER CENTER ONLY)
Basophils Absolute: 0 10*3/uL (ref 0.0–0.1)
Basophils Relative: 1 %
EOS ABS: 0.1 10*3/uL (ref 0.0–0.5)
EOS PCT: 2 %
HCT: 35.9 % — ABNORMAL LOW (ref 38.4–49.9)
Hemoglobin: 12.4 g/dL — ABNORMAL LOW (ref 13.0–17.1)
LYMPHS ABS: 0.6 10*3/uL — AB (ref 0.9–3.3)
Lymphocytes Relative: 14 %
MCH: 30.4 pg (ref 27.2–33.4)
MCHC: 34.5 g/dL (ref 32.0–36.0)
MCV: 88.2 fL (ref 79.3–98.0)
MONO ABS: 0.4 10*3/uL (ref 0.1–0.9)
MONOS PCT: 9 %
Neutro Abs: 3.5 10*3/uL (ref 1.5–6.5)
Neutrophils Relative %: 74 %
PLATELETS: 95 10*3/uL — AB (ref 140–400)
RBC: 4.07 MIL/uL — AB (ref 4.20–5.82)
RDW: 16.7 % — AB (ref 11.0–14.6)
WBC Count: 4.7 10*3/uL (ref 4.0–10.3)

## 2018-02-28 MED ORDER — PACLITAXEL PROTEIN-BOUND CHEMO INJECTION 100 MG: 100.0000 mg/m2 | Freq: Once | Status: DC

## 2018-02-28 MED ORDER — SODIUM CHLORIDE 0.9% FLUSH
10.0000 mL | INTRAVENOUS | Status: DC | PRN
  Administered 2018-02-28: 10 mL
  Filled 2018-02-28: qty 10

## 2018-02-28 MED ORDER — PROCHLORPERAZINE MALEATE 10 MG PO TABS
10.0000 mg | ORAL_TABLET | Freq: Once | ORAL | Status: AC
  Administered 2018-02-28: 10 mg via ORAL

## 2018-02-28 MED ORDER — SODIUM CHLORIDE 0.9 % IV SOLN
Freq: Once | INTRAVENOUS | Status: AC
  Administered 2018-02-28: 11:00:00 via INTRAVENOUS
  Filled 2018-02-28: qty 250

## 2018-02-28 MED ORDER — PROCHLORPERAZINE MALEATE 10 MG PO TABS
ORAL_TABLET | ORAL | Status: AC
  Filled 2018-02-28: qty 1

## 2018-02-28 MED ORDER — HEPARIN SOD (PORK) LOCK FLUSH 100 UNIT/ML IV SOLN
500.0000 [IU] | Freq: Once | INTRAVENOUS | Status: AC | PRN
  Administered 2018-02-28: 500 [IU]
  Filled 2018-02-28: qty 5

## 2018-02-28 MED ORDER — SODIUM CHLORIDE 0.9 % IV SOLN
800.0000 mg/m2 | Freq: Once | INTRAVENOUS | Status: AC
  Administered 2018-02-28: 1786 mg via INTRAVENOUS
  Filled 2018-02-28: qty 46.97

## 2018-02-28 MED ORDER — SODIUM CHLORIDE 0.9% FLUSH
10.0000 mL | Freq: Once | INTRAVENOUS | Status: AC
  Administered 2018-02-28: 10 mL
  Filled 2018-02-28: qty 10

## 2018-02-28 NOTE — Progress Notes (Signed)
[place] OFFICE PROGRESS NOTE   Diagnosis: Pancreas cancer  INTERVAL HISTORY:   Mr. Heidinger completed another treatment with gemcitabine on 02/14/2018.  Tolerated chemotherapy well.  No fever or rash.  Good appetite.  Occasional sharp abdominal pain.  No consistent pain.  He is working.  He continues to have neuropathy symptoms in the feet.  This affects his balance.  Objective:  Vital signs in last 24 hours:  There were no vitals taken for this visit.    HEENT: No thrush or ulcers Resp: Lungs clear bilaterally Cardio: Regular rate and rhythm GI: No hepatomegaly, no mass, nontender Vascular: No leg edema   Portacath/PICC-without erythema  Lab Results:  Lab Results  Component Value Date   WBC 4.7 02/28/2018   HGB 12.4 (L) 02/28/2018   HCT 35.9 (L) 02/28/2018   MCV 88.2 02/28/2018   PLT 95 (L) 02/28/2018   NEUTROABS 3.5 02/28/2018    CMP  Lab Results  Component Value Date   NA 142 02/14/2018   K 4.0 02/14/2018   CL 106 02/14/2018   CO2 26 02/14/2018   GLUCOSE 145 (H) 02/14/2018   BUN 11 02/14/2018   CREATININE 0.92 02/14/2018   CALCIUM 8.5 (L) 02/14/2018   PROT 5.9 (L) 02/14/2018   ALBUMIN 3.5 02/14/2018   AST 25 02/14/2018   ALT 38 02/14/2018   ALKPHOS 196 (H) 02/14/2018   BILITOT 1.4 (H) 02/14/2018   GFRNONAA >60 02/14/2018   GFRAA >60 02/14/2018   CA 19-9 on 02/14/2018: 34  Medications: I have reviewed the patient's current medications.   Assessment/Plan: 1. Pancreas head mass  CT abdomen/pelvis 08/03/2017-fullness in the head of the pancreas with stranding in the peripancreatic fat and pancreatic ductal dilatation.   MRI abdomen 08/04/2017-findings favored to represent acute on chronic pancreatitis; equivocal soft tissue fullness within the head and uncinate process of the pancreas; indeterminate too small to characterize right hepatic lobe lesion; small volume abdominal ascites; splenomegaly.   CA-19-9 elevated at 977on  08/04/2017.  Status postupper EUS 08/10/2017-findings of an irregular masslike region in the pancreatic head measuring approximately 2.7 cm. This directly abutted the main portal vein but no other major vascular structures. Biopsies obtained with preliminary cytology positive for malignancy, likely adenocarcinoma. The final report is pending. The common bile duct and main pancreatic duct were both dilated. ERCP was then proceeded with forstent placement. Multiple attempts were made tocannulate the bile duct without success. The procedure was aborted.  08/16/2017 ERCP with placement of a metal biliary stent in the common bile duct by Dr. Francella Solian at [facility].  Cycle 1 FOLFIRINOX 08/31/2017  Cycle 2 FOLFIRINOX 09/14/2017  Cycle 3 FOLFIRINOX 09/28/2017  Cycle 4 FOLFIRINOX 10/12/2017 (oxaliplatin dose reduced secondary to thrombocytopenia)  Cycle 5 FOLFIRINOX 10/26/2017  Cycle 6 FOLFIRINOX 11/09/2017  Restaging CTs at [facility] 11/29/2017-no definitive evidence of distant metastatic disease. 2 subcentimeter liver lesions described on prior MRI not visualized on CT. Ill-defined pancreatic head mass stable to decreased in size measuring 2.1 x 2 cm. Peripancreatic inflammatory stranding decreased from prior. No biliary ductal dilatation. Celiac axis less than 180 degrees abutment. Common hepatic artery with greater than 180 degrees encasement; superior mesenteric artery with short segment less than 180 degrees abutment; portal vein/superior mesenteric vein with greater than 180 degrees encasement of the extrahepatic portal vein with associated circumferential narrowing at the portomesenteric, overall substantially improved from prior examination. Now less than 180 degree abutment of the SMV. The portal vein and SMV remain patent. Splenic vein patent.  Cycle 1 gemcitabine/Abraxane 12/06/2017  Cycle 2 gemcitabine/Abraxane 12/20/2017  Cycle 3 gemcitabine/Abraxane 01/03/2018  Cycle 4 gemcitabine/Abraxane  01/17/2018  Cycle 5 gemcitabine 01/31/2018 (Abraxane held due to neuropathy)  Cycle 6 gemcitabine 02/14/2018 (Abraxane held due to neuropathy)  Cycle 7 gemcitabine 02/28/2018 oh (Abraxane held due to neuropathy) 2. Hypertension 3. Port-A-Cath placement 08/30/2017 4. History of elevated bilirubin-questionGilbert's syndrome 5. Mild leukopenia, thrombocytopenia-question secondary to portal hypertension/splenomegaly;09/14/2017 white count improved, platelet count stable;progressive thrombocytopenia following gemcitabine/Abraxane 6. Kidney stones 7. Fever following cycles 2 and 3 gemcitabine/Abraxane,? Fever related to gemcitabine     Disposition: Cory Kelly appears stable.  He is tolerating the gemcitabine well.  The CA 19-9 is lower.  He will complete another treatment with gemcitabine today.  He is scheduled for evaluation at [facility] for SBRT.  He will return for an office visit in 2 weeks.  He will contact us for bruising or bleeding.  Betsy Coder, MD  02/28/2018  10:21 AM

## 2018-02-28 NOTE — Patient Instructions (Signed)
[facility] °Discharge Instructions for Patients Receiving Chemotherapy ° °Today you received the following chemotherapy agents Gemzar ° °To help prevent nausea and vomiting after your treatment, we encourage you to take your nausea medication as directed. °  °If you develop nausea and vomiting that is not controlled by your nausea medication, call the clinic.  ° °BELOW ARE SYMPTOMS THAT SHOULD BE REPORTED IMMEDIATELY: °· *FEVER GREATER THAN 100.5 F °· *CHILLS WITH OR WITHOUT FEVER °· NAUSEA AND VOMITING THAT IS NOT CONTROLLED WITH YOUR NAUSEA MEDICATION °· *UNUSUAL SHORTNESS OF BREATH °· *UNUSUAL BRUISING OR BLEEDING °· TENDERNESS IN MOUTH AND THROAT WITH OR WITHOUT PRESENCE OF ULCERS °· *URINARY PROBLEMS °· *BOWEL PROBLEMS °· UNUSUAL RASH °Items with * indicate a potential emergency and should be followed up as soon as possible. ° °Feel free to call the clinic should you have any questions or concerns. The clinic phone number is [phone]. ° °Please show the CHEMO ALERT CARD at check-in to the Emergency Department and triage nurse. ° ° °

## 2018-02-28 NOTE — Progress Notes (Signed)
Ok to treat with plts of 95 per Dr. Benay Spice

## 2018-03-01 LAB — CANCER ANTIGEN 19-9: CA 19-9: 41 U/mL — ABNORMAL HIGH (ref 0–35)

## 2018-03-08 DIAGNOSIS — K859 Acute pancreatitis without necrosis or infection, unspecified: Secondary | ICD-10-CM | POA: Diagnosis not present

## 2018-03-08 DIAGNOSIS — R161 Splenomegaly, not elsewhere classified: Secondary | ICD-10-CM | POA: Diagnosis not present

## 2018-03-08 DIAGNOSIS — C250 Malignant neoplasm of head of pancreas: Secondary | ICD-10-CM | POA: Diagnosis not present

## 2018-03-08 DIAGNOSIS — I700 Atherosclerosis of aorta: Secondary | ICD-10-CM | POA: Diagnosis not present

## 2018-03-08 DIAGNOSIS — G629 Polyneuropathy, unspecified: Secondary | ICD-10-CM | POA: Diagnosis not present

## 2018-03-08 DIAGNOSIS — Z95828 Presence of other vascular implants and grafts: Secondary | ICD-10-CM | POA: Diagnosis not present

## 2018-03-12 ENCOUNTER — Encounter: Payer: Self-pay | Admitting: Oncology

## 2018-03-13 ENCOUNTER — Encounter: Payer: Self-pay | Admitting: Oncology

## 2018-03-14 ENCOUNTER — Inpatient Hospital Stay (HOSPITAL_BASED_OUTPATIENT_CLINIC_OR_DEPARTMENT_OTHER): Payer: BLUE CROSS/BLUE SHIELD | Admitting: Nurse Practitioner

## 2018-03-14 ENCOUNTER — Encounter: Payer: Self-pay | Admitting: Nurse Practitioner

## 2018-03-14 ENCOUNTER — Inpatient Hospital Stay: Payer: BLUE CROSS/BLUE SHIELD | Attending: Nurse Practitioner

## 2018-03-14 ENCOUNTER — Telehealth: Payer: Self-pay | Admitting: Oncology

## 2018-03-14 ENCOUNTER — Inpatient Hospital Stay: Payer: BLUE CROSS/BLUE SHIELD

## 2018-03-14 VITALS — BP 159/72 | HR 50 | Temp 97.7°F | Resp 17 | Ht 75.75 in | Wt 229.1 lb

## 2018-03-14 DIAGNOSIS — I10 Essential (primary) hypertension: Secondary | ICD-10-CM | POA: Diagnosis not present

## 2018-03-14 DIAGNOSIS — C250 Malignant neoplasm of head of pancreas: Secondary | ICD-10-CM

## 2018-03-14 LAB — CBC WITH DIFFERENTIAL (CANCER CENTER ONLY)
Basophils Absolute: 0 10*3/uL (ref 0.0–0.1)
Basophils Relative: 1 %
EOS PCT: 2 %
Eosinophils Absolute: 0.1 10*3/uL (ref 0.0–0.5)
HCT: 37.7 % — ABNORMAL LOW (ref 38.4–49.9)
HEMOGLOBIN: 12.8 g/dL — AB (ref 13.0–17.1)
LYMPHS PCT: 18 %
Lymphs Abs: 0.7 10*3/uL — ABNORMAL LOW (ref 0.9–3.3)
MCH: 30.5 pg (ref 27.2–33.4)
MCHC: 33.9 g/dL (ref 32.0–36.0)
MCV: 90 fL (ref 79.3–98.0)
MONOS PCT: 11 %
Monocytes Absolute: 0.5 10*3/uL (ref 0.1–0.9)
NEUTROS PCT: 68 %
Neutro Abs: 2.8 10*3/uL (ref 1.5–6.5)
Platelet Count: 104 10*3/uL — ABNORMAL LOW (ref 140–400)
RBC: 4.19 MIL/uL — AB (ref 4.20–5.82)
RDW: 17 % — ABNORMAL HIGH (ref 11.0–14.6)
WBC: 4 10*3/uL (ref 4.0–10.3)

## 2018-03-14 NOTE — Progress Notes (Addendum)
[place] OFFICE PROGRESS NOTE   Diagnosis: Pancreas cancer  INTERVAL HISTORY:   Cory Kelly returns as scheduled.  He completed another treatment with gemcitabine 02/28/2018.  He denies nausea/vomiting.  No fever.  No rash.  He continues to have numbness in the hands and feet.  He notes that feet become painful after walking 2-1/2 to 3 miles.  No fever, cough, shortness of breath, chest pain.  No leg swelling or calf pain.  He reports the CA-19-9 on 03/08/2018 at [facility] was higher than the last reported value through our lab.  Objective:  Vital signs in last 24 hours:  Blood pressure (!) 159/72, pulse (!) 50, temperature 97.7 F (36.5 C), temperature source Oral, resp. rate 17, height 6' 3.75" (1.924 m), weight 229 lb 1.6 oz (103.9 kg), SpO2 100 %.    HEENT: No thrush or ulcers. Resp: End inspiratory bronchial breath sounds at the upper lung fields bilaterally.  No respiratory distress. Cardio: Regular rate and rhythm. GI: Abdomen soft and nontender.  No hepatomegaly. Vascular: No leg edema.  Calves soft and nontender. Skin: No rash. Port-A-Cath without erythema.   Lab Results:  Lab Results  Component Value Date   WBC 4.0 03/14/2018   HGB 12.8 (L) 03/14/2018   HCT 37.7 (L) 03/14/2018   MCV 90.0 03/14/2018   PLT 104 (L) 03/14/2018   NEUTROABS 2.8 03/14/2018    Imaging:  No results found.  Medications: I have reviewed the patient's current medications.  Assessment/Plan: 1. Pancreas head mass  CT abdomen/pelvis 08/03/2017-fullness in the head of the pancreas with stranding in the peripancreatic fat and pancreatic ductal dilatation.   MRI abdomen 08/04/2017-findings favored to represent acute on chronic pancreatitis; equivocal soft tissue fullness within the head and uncinate process of the pancreas; indeterminate too small to characterize right hepatic lobe lesion; small volume abdominal ascites; splenomegaly.   CA-19-9 elevated at 977on  08/04/2017.  Status postupper EUS 08/10/2017-findings of an irregular masslike region in the pancreatic head measuring approximately 2.7 cm. This directly abutted the main portal vein but no other major vascular structures. Biopsies obtained with preliminary cytology positive for malignancy, likely adenocarcinoma. The final report is pending. The common bile duct and main pancreatic duct were both dilated. ERCP was then proceeded with forstent placement. Multiple attempts were made tocannulate the bile duct without success. The procedure was aborted.  08/16/2017 ERCP with placement of a metal biliary stent in the common bile duct by Dr. Francella Solian at [facility].  Cycle 1 FOLFIRINOX 08/31/2017  Cycle 2 FOLFIRINOX 09/14/2017  Cycle 3 FOLFIRINOX 09/28/2017  Cycle 4 FOLFIRINOX 10/12/2017 (oxaliplatin dose reduced secondary to thrombocytopenia)  Cycle 5 FOLFIRINOX 10/26/2017  Cycle 6 FOLFIRINOX 11/09/2017  Restaging CTs at [facility] 11/29/2017-no definitive evidence of distant metastatic disease. 2 subcentimeter liver lesions described on prior MRI not visualized on CT. Ill-defined pancreatic head mass stable to decreased in size measuring 2.1 x 2 cm. Peripancreatic inflammatory stranding decreased from prior. No biliary ductal dilatation. Celiac axis less than 180 degrees abutment. Common hepatic artery with greater than 180 degrees encasement; superior mesenteric artery with short segment less than 180 degrees abutment; portal vein/superior mesenteric vein with greater than 180 degrees encasement of the extrahepatic portal vein with associated circumferential narrowing at the portomesenteric, overall substantially improved from prior examination. Now less than 180 degree abutment of the SMV. The portal vein and SMV remain patent. Splenic vein patent.  Cycle 1 gemcitabine/Abraxane 12/06/2017  Cycle 2 gemcitabine/Abraxane 12/20/2017  Cycle 3 gemcitabine/Abraxane 01/03/2018  Cycle 4 gemcitabine/Abraxane  01/17/2018  Cycle 5 gemcitabine 01/31/2018 (Abraxane held due to neuropathy)  Cycle 6 gemcitabine 02/14/2018 (Abraxane held due to neuropathy)  Cycle 7 gemcitabine 02/28/2018 (Abraxane held due to neuropathy)  CT chest/abdomen/pelvis at [facility] 03/08/2018- stable appearance of previously identified infiltrating pancreatic head mass.  No CT evidence of metastatic disease. 2. Hypertension 3. Port-A-Cath placement 08/30/2017 4. History of elevated bilirubin-questionGilbert's syndrome 5. Mild leukopenia, thrombocytopenia-question secondary to portal hypertension/splenomegaly;09/14/2017 white count improved, platelet count stable;progressive thrombocytopenia following gemcitabine/Abraxane 6. Kidney stones 7. Fever following cycles 2 and 3 gemcitabine/Abraxane,? Fever related to gemcitabine   Disposition: Cory Kelly appears stable.  He completed a cycle of gemcitabine 02/28/2018.  Restaging CTs at [facility] on 03/08/2018 showed the pancreatic head mass was stable and there was no evidence of metastatic disease.  He is scheduled to undergo endoscopy for placement of fiducial markers and radiation simulation on 03/21/2018.  He has an appointment with Dr. Donnal Moat also on 03/21/2018.    CA-19-9 tumor marker at [facility] on 03/08/2018 was higher than the last value done in our office.  We discussed the difficulty in comparing the tumor markers between labs.  We will follow-up on the CA-19-9 done in our office today.  He will return for a follow-up visit in approximately 1 month.  He will contact the office in the interim with any problems.  Patient seen with Dr. Benay Spice.      Ned Card ANP/GNP-BC   03/14/2018  9:24 AM  This was a shared visit with Ned Card.  Cory Kelly appears stable.  The restaging CT at [facility] revealed no evidence of disease progression.  He is being evaluated for SBRT at [facility].  He will see Dr. Donnal Moat next week.  He will return for an office visit in approximately 1 month.  We are available to  see him sooner as needed.  Julieanne Manson, MD

## 2018-03-14 NOTE — Telephone Encounter (Signed)
Scheduled appt per 9/11 los - pt is aware of appt date and time.

## 2018-03-15 LAB — CANCER ANTIGEN 19-9: CA 19-9: 53 U/mL — ABNORMAL HIGH (ref 0–35)

## 2018-03-21 ENCOUNTER — Encounter: Payer: Self-pay | Admitting: Nurse Practitioner

## 2018-03-21 DIAGNOSIS — K8689 Other specified diseases of pancreas: Secondary | ICD-10-CM | POA: Diagnosis not present

## 2018-03-21 DIAGNOSIS — C250 Malignant neoplasm of head of pancreas: Secondary | ICD-10-CM | POA: Diagnosis not present

## 2018-03-21 DIAGNOSIS — Z9221 Personal history of antineoplastic chemotherapy: Secondary | ICD-10-CM | POA: Diagnosis not present

## 2018-03-21 DIAGNOSIS — K859 Acute pancreatitis without necrosis or infection, unspecified: Secondary | ICD-10-CM | POA: Diagnosis not present

## 2018-03-21 DIAGNOSIS — I10 Essential (primary) hypertension: Secondary | ICD-10-CM | POA: Diagnosis not present

## 2018-03-21 DIAGNOSIS — K259 Gastric ulcer, unspecified as acute or chronic, without hemorrhage or perforation: Secondary | ICD-10-CM | POA: Diagnosis not present

## 2018-03-21 DIAGNOSIS — Z791 Long term (current) use of non-steroidal anti-inflammatories (NSAID): Secondary | ICD-10-CM | POA: Diagnosis not present

## 2018-03-21 DIAGNOSIS — K319 Disease of stomach and duodenum, unspecified: Secondary | ICD-10-CM | POA: Diagnosis not present

## 2018-03-21 DIAGNOSIS — C4491 Basal cell carcinoma of skin, unspecified: Secondary | ICD-10-CM | POA: Diagnosis not present

## 2018-03-21 DIAGNOSIS — E785 Hyperlipidemia, unspecified: Secondary | ICD-10-CM | POA: Diagnosis not present

## 2018-03-21 DIAGNOSIS — Z79899 Other long term (current) drug therapy: Secondary | ICD-10-CM | POA: Diagnosis not present

## 2018-03-21 DIAGNOSIS — K269 Duodenal ulcer, unspecified as acute or chronic, without hemorrhage or perforation: Secondary | ICD-10-CM | POA: Diagnosis not present

## 2018-03-21 DIAGNOSIS — K3189 Other diseases of stomach and duodenum: Secondary | ICD-10-CM | POA: Diagnosis not present

## 2018-03-26 ENCOUNTER — Other Ambulatory Visit: Payer: Self-pay | Admitting: Emergency Medicine

## 2018-03-26 DIAGNOSIS — C250 Malignant neoplasm of head of pancreas: Secondary | ICD-10-CM

## 2018-03-26 MED ORDER — GABAPENTIN 100 MG PO CAPS: 100.0000 mg | ORAL_CAPSULE | Freq: Two times a day (BID) | ORAL | 0 refills | Status: DC

## 2018-04-04 ENCOUNTER — Other Ambulatory Visit: Payer: Self-pay | Admitting: Physician Assistant

## 2018-04-04 DIAGNOSIS — C250 Malignant neoplasm of head of pancreas: Secondary | ICD-10-CM | POA: Diagnosis not present

## 2018-04-04 MED ORDER — OLMESARTAN MEDOXOMIL 40 MG PO TABS: 40.0000 mg | ORAL_TABLET | Freq: Every day | ORAL | 0 refills | Status: DC

## 2018-04-05 DIAGNOSIS — C250 Malignant neoplasm of head of pancreas: Secondary | ICD-10-CM | POA: Diagnosis not present

## 2018-04-06 DIAGNOSIS — C250 Malignant neoplasm of head of pancreas: Secondary | ICD-10-CM | POA: Diagnosis not present

## 2018-04-09 DIAGNOSIS — C250 Malignant neoplasm of head of pancreas: Secondary | ICD-10-CM | POA: Diagnosis not present

## 2018-04-10 DIAGNOSIS — C250 Malignant neoplasm of head of pancreas: Secondary | ICD-10-CM | POA: Diagnosis not present

## 2018-04-11 DIAGNOSIS — C250 Malignant neoplasm of head of pancreas: Secondary | ICD-10-CM | POA: Diagnosis not present

## 2018-04-12 ENCOUNTER — Encounter: Payer: Self-pay | Admitting: Nurse Practitioner

## 2018-04-13 ENCOUNTER — Other Ambulatory Visit: Payer: Self-pay | Admitting: Emergency Medicine

## 2018-04-13 DIAGNOSIS — C250 Malignant neoplasm of head of pancreas: Secondary | ICD-10-CM

## 2018-04-13 MED ORDER — POTASSIUM CHLORIDE ER 10 MEQ PO CPCR: ORAL_CAPSULE | ORAL | 0 refills | Status: DC

## 2018-04-16 ENCOUNTER — Ambulatory Visit: Payer: BLUE CROSS/BLUE SHIELD | Admitting: Oncology

## 2018-04-18 ENCOUNTER — Other Ambulatory Visit: Payer: Self-pay | Admitting: *Deleted

## 2018-04-18 DIAGNOSIS — C250 Malignant neoplasm of head of pancreas: Secondary | ICD-10-CM

## 2018-04-18 MED ORDER — GABAPENTIN 100 MG PO CAPS: 100.0000 mg | ORAL_CAPSULE | Freq: Two times a day (BID) | ORAL | 0 refills | Status: DC

## 2018-04-20 ENCOUNTER — Telehealth: Payer: Self-pay | Admitting: Oncology

## 2018-04-20 NOTE — Telephone Encounter (Signed)
Appt scheduled patient notified per 10/17 sch msg °

## 2018-04-24 ENCOUNTER — Other Ambulatory Visit: Payer: Self-pay | Admitting: *Deleted

## 2018-04-24 DIAGNOSIS — C250 Malignant neoplasm of head of pancreas: Secondary | ICD-10-CM

## 2018-04-24 MED ORDER — GABAPENTIN 100 MG PO CAPS: 100.0000 mg | ORAL_CAPSULE | Freq: Two times a day (BID) | ORAL | 0 refills | Status: DC

## 2018-05-01 DIAGNOSIS — C250 Malignant neoplasm of head of pancreas: Secondary | ICD-10-CM | POA: Diagnosis not present

## 2018-05-01 DIAGNOSIS — I10 Essential (primary) hypertension: Secondary | ICD-10-CM | POA: Diagnosis not present

## 2018-05-01 DIAGNOSIS — R530 Neoplastic (malignant) related fatigue: Secondary | ICD-10-CM | POA: Diagnosis not present

## 2018-05-01 DIAGNOSIS — G620 Drug-induced polyneuropathy: Secondary | ICD-10-CM | POA: Diagnosis not present

## 2018-05-03 ENCOUNTER — Inpatient Hospital Stay: Payer: BLUE CROSS/BLUE SHIELD | Attending: Nurse Practitioner | Admitting: Oncology

## 2018-05-03 DIAGNOSIS — G629 Polyneuropathy, unspecified: Secondary | ICD-10-CM | POA: Diagnosis not present

## 2018-05-03 DIAGNOSIS — I10 Essential (primary) hypertension: Secondary | ICD-10-CM | POA: Diagnosis not present

## 2018-05-03 DIAGNOSIS — C250 Malignant neoplasm of head of pancreas: Secondary | ICD-10-CM

## 2018-05-03 MED ORDER — GABAPENTIN 100 MG PO CAPS: ORAL_CAPSULE | ORAL | 0 refills | Status: DC

## 2018-05-03 MED ORDER — POTASSIUM CHLORIDE ER 10 MEQ PO CPCR: ORAL_CAPSULE | ORAL | 0 refills | Status: DC

## 2018-05-03 NOTE — Progress Notes (Signed)
**Cory [name]** Cory Cory Kelly   Diagnosis: Pancreas cancer  INTERVAL HISTORY:   Cory Cory Kelly returns as scheduled.  He completed SBRT at [facility] between 04/05/2018 and 04/11/2018.  He reports tolerating the treatment well.  He developed fatigue possibly 1 week after completing radiation.  This has improved. He does not have consistent abdominal pain.  He has discomfort when he is hungry. 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  He is taking gabapentin twice daily.  Good appetite.  He is working.  Cory Cory Kelly is scheduled for restaging CTs and an appointment with Dr. Donnal Cory Kelly next week.  Objective:  Vital signs in last 24 hours:  Blood pressure (!) 143/90, pulse (!) 58, temperature 98.2 F (36.8 C), temperature source Oral, resp. rate 18, height 6' 3.75" (1.924 m), weight 229 lb 6.4 oz (104.1 kg), SpO2 100 %.    HEENT: Neck without mass Lymphatics: No cervical, supraclavicular, axillary, or inguinal nodes Resp: Lungs with end inspiratory bronchial sounds at the upper posterior chest bilaterally, no respiratory distress Cardio: Regular rate and rhythm GI: No hepatosplenomegaly, nontender, no mass Vascular: No leg edema  Portacath/PICC-without erythema  Medications: I have reviewed the patient's current medications.   Assessment/Plan: 1. Pancreas head mass  CT abdomen/pelvis 08/03/2017-fullness in the head of the pancreas with stranding in the peripancreatic fat and pancreatic ductal dilatation.   MRI abdomen 08/04/2017-findings favored to represent acute on chronic pancreatitis; equivocal soft tissue fullness within the head and uncinate process of the pancreas; indeterminate too small to characterize right hepatic lobe lesion; small volume abdominal ascites; splenomegaly.   CA-19-9 elevated at 977on 08/04/2017.  Status postupper EUS 08/10/2017-findings of an irregular masslike region in the pancreatic head measuring approximately 2.7  cm. This directly abutted the main portal vein but no other major vascular structures. Biopsies obtained with preliminary cytology positive for malignancy, likely adenocarcinoma. The final report is pending. The common bile duct and main pancreatic duct were both dilated. ERCP was then proceeded with forstent placement. Multiple attempts were made tocannulate the bile duct without success. The procedure was aborted.  08/16/2017 ERCP with placement of a metal biliary stent in the common bile duct by Dr. Francella Cory Kelly at [facility].  Cycle 1 FOLFIRINOX 08/31/2017  Cycle 2 FOLFIRINOX 09/14/2017  Cycle 3 FOLFIRINOX 09/28/2017  Cycle 4 FOLFIRINOX 10/12/2017 (oxaliplatin dose reduced secondary to thrombocytopenia)  Cycle 5 FOLFIRINOX 10/26/2017  Cycle 6 FOLFIRINOX 11/09/2017  Restaging CTs at [facility] 11/29/2017-no definitive evidence of distant metastatic disease. 2 subcentimeter liver lesions described on prior MRI not visualized on CT. Ill-defined pancreatic head mass stable to decreased in size measuring 2.1 x 2 cm. Peripancreatic inflammatory stranding decreased from prior. No biliary ductal dilatation. Celiac axis less than 180 degrees abutment. Common hepatic artery with greater than 180 degrees encasement; superior mesenteric artery with short segment less than 180 degrees abutment; portal vein/superior mesenteric vein with greater than 180 degrees encasement of the extrahepatic portal vein with associated circumferential narrowing at the portomesenteric, overall substantially improved from prior examination. Now less than 180 degree abutment of the SMV. The portal vein and SMV remain patent. Splenic vein patent.  Cycle 1 gemcitabine/Abraxane 12/06/2017  Cycle 2 gemcitabine/Abraxane 12/20/2017  Cycle 3 gemcitabine/Abraxane 01/03/2018  Cycle 4 gemcitabine/Abraxane 01/17/2018  Cycle 5 gemcitabine 01/31/2018 (Abraxane held due to neuropathy)  Cycle 6 gemcitabine 02/14/2018 (Abraxane held due to  neuropathy)  Cycle 7 gemcitabine 02/28/2018 (Abraxane held due to neuropathy)  CT chest/abdomen/pelvis at [facility] 03/08/2018- stable appearance  of previously identified infiltrating pancreatic head mass.  No CT evidence of metastatic disease.  SBRT to the pancreas 04/05/2018-04/11/2018 2. Hypertension 3. Port-A-Cath placement 08/30/2017 4. History of elevated bilirubin-questionGilbert's syndrome 5. Mild leukopenia, thrombocytopenia-question secondary to portal hypertension/splenomegaly;09/14/2017 white count improved, platelet count stable;progressive thrombocytopenia following gemcitabine/Abraxane 6. Kidney stones 7. Fever following cycles 2 and 3 gemcitabine/Abraxane,? Fever related to gemcitabine    Disposition: Cory Cory Kelly appears well.  He completed SBRT to the pancreas earlier this month.  He is scheduled for a restaging evaluation and follow-up with Dr. Donnal Cory Kelly next week.  He says that he has been scheduled for pancreas surgery on 05/14/2018.  He plans to obtain an influenza vaccine.  Cory Cory Kelly will return for an office visit here on 06/06/2018.  He will return for a Port-A-Cath flush Cory Kelly.  15 minutes were spent with the patient today.  The majority of the time was used for counseling and coordination of care.  Cory Coder, MD  05/03/2018  1:55 PM

## 2018-05-04 ENCOUNTER — Telehealth: Payer: Self-pay | Admitting: Oncology

## 2018-05-04 NOTE — Telephone Encounter (Signed)
Scheduled appt per 10/31 los - pt is aware of appt date and time.

## 2018-05-08 ENCOUNTER — Inpatient Hospital Stay: Payer: BLUE CROSS/BLUE SHIELD | Attending: Nurse Practitioner

## 2018-05-08 DIAGNOSIS — Z452 Encounter for adjustment and management of vascular access device: Secondary | ICD-10-CM | POA: Diagnosis not present

## 2018-05-08 DIAGNOSIS — R911 Solitary pulmonary nodule: Secondary | ICD-10-CM | POA: Diagnosis not present

## 2018-05-08 DIAGNOSIS — I10 Essential (primary) hypertension: Secondary | ICD-10-CM | POA: Diagnosis not present

## 2018-05-08 DIAGNOSIS — R970 Elevated carcinoembryonic antigen [CEA]: Secondary | ICD-10-CM | POA: Diagnosis not present

## 2018-05-08 DIAGNOSIS — K769 Liver disease, unspecified: Secondary | ICD-10-CM | POA: Diagnosis not present

## 2018-05-08 DIAGNOSIS — C787 Secondary malignant neoplasm of liver and intrahepatic bile duct: Secondary | ICD-10-CM | POA: Diagnosis not present

## 2018-05-08 DIAGNOSIS — C250 Malignant neoplasm of head of pancreas: Secondary | ICD-10-CM | POA: Diagnosis not present

## 2018-05-08 DIAGNOSIS — Z95828 Presence of other vascular implants and grafts: Secondary | ICD-10-CM

## 2018-05-08 MED ORDER — HEPARIN SOD (PORK) LOCK FLUSH 100 UNIT/ML IV SOLN
500.0000 [IU] | Freq: Once | INTRAVENOUS | Status: AC
  Administered 2018-05-08: 500 [IU]
  Filled 2018-05-08: qty 5

## 2018-05-08 MED ORDER — SODIUM CHLORIDE 0.9% FLUSH
10.0000 mL | Freq: Once | INTRAVENOUS | Status: AC
  Administered 2018-05-08: 10 mL
  Filled 2018-05-08: qty 10

## 2018-05-09 DIAGNOSIS — R911 Solitary pulmonary nodule: Secondary | ICD-10-CM | POA: Diagnosis not present

## 2018-05-09 DIAGNOSIS — Z923 Personal history of irradiation: Secondary | ICD-10-CM | POA: Diagnosis not present

## 2018-05-09 DIAGNOSIS — K769 Liver disease, unspecified: Secondary | ICD-10-CM | POA: Diagnosis not present

## 2018-05-09 DIAGNOSIS — K8681 Exocrine pancreatic insufficiency: Secondary | ICD-10-CM | POA: Diagnosis not present

## 2018-05-09 DIAGNOSIS — C250 Malignant neoplasm of head of pancreas: Secondary | ICD-10-CM | POA: Diagnosis not present

## 2018-05-09 DIAGNOSIS — C259 Malignant neoplasm of pancreas, unspecified: Secondary | ICD-10-CM | POA: Diagnosis not present

## 2018-05-09 DIAGNOSIS — K7689 Other specified diseases of liver: Secondary | ICD-10-CM | POA: Diagnosis not present

## 2018-05-10 ENCOUNTER — Telehealth: Payer: Self-pay | Admitting: *Deleted

## 2018-05-10 NOTE — Telephone Encounter (Signed)
Wife called to report he has disease progression per Dr. Violet Baldy at [facility] and needs to see Dr. Benay Spice now since surgery is not an option. Also received VM from clinician Santiago Glad) patient has progression in lungs/liver and marker is up to 2100. The can be reached at [phone] if Dr. Benay Spice wishes to speak with them. Called wife with appointment for 11/11 at 11:15 with NP/Dr. Benay Spice.

## 2018-05-11 ENCOUNTER — Encounter: Payer: Self-pay | Admitting: Oncology

## 2018-05-14 ENCOUNTER — Inpatient Hospital Stay (HOSPITAL_BASED_OUTPATIENT_CLINIC_OR_DEPARTMENT_OTHER): Payer: BLUE CROSS/BLUE SHIELD | Admitting: Nurse Practitioner

## 2018-05-14 ENCOUNTER — Inpatient Hospital Stay: Payer: BLUE CROSS/BLUE SHIELD

## 2018-05-14 ENCOUNTER — Telehealth: Payer: Self-pay | Admitting: Nurse Practitioner

## 2018-05-14 ENCOUNTER — Encounter: Payer: Self-pay | Admitting: Nurse Practitioner

## 2018-05-14 VITALS — BP 170/95 | HR 56 | Temp 98.5°F | Resp 17 | Ht 75.75 in | Wt 233.7 lb

## 2018-05-14 DIAGNOSIS — I10 Essential (primary) hypertension: Secondary | ICD-10-CM | POA: Diagnosis not present

## 2018-05-14 DIAGNOSIS — Z452 Encounter for adjustment and management of vascular access device: Secondary | ICD-10-CM | POA: Diagnosis not present

## 2018-05-14 DIAGNOSIS — R911 Solitary pulmonary nodule: Secondary | ICD-10-CM | POA: Diagnosis not present

## 2018-05-14 DIAGNOSIS — R970 Elevated carcinoembryonic antigen [CEA]: Secondary | ICD-10-CM

## 2018-05-14 DIAGNOSIS — K769 Liver disease, unspecified: Secondary | ICD-10-CM | POA: Diagnosis not present

## 2018-05-14 DIAGNOSIS — C787 Secondary malignant neoplasm of liver and intrahepatic bile duct: Secondary | ICD-10-CM | POA: Diagnosis not present

## 2018-05-14 DIAGNOSIS — C250 Malignant neoplasm of head of pancreas: Secondary | ICD-10-CM

## 2018-05-14 NOTE — Progress Notes (Addendum)
[place] OFFICE PROGRESS NOTE   Diagnosis:  Pancreas cancer  INTERVAL HISTORY:   Cory Kelly returns prior to scheduled follow-up.  He was seen by Dr. Donnal Moat at [facility] on 05/09/2018.  He is not felt to be a surgical candidate at this time due to new imaging findings in the lung and liver and a significant increase in the CA-19-9.  Cory Kelly feels well.  He has a good appetite.  He denies pain.  No fever, cough or shortness of breath.  Objective:  Vital signs in last 24 hours:  Blood pressure (!) 170/95, pulse (!) 56, temperature 98.5 F (36.9 C), temperature source Oral, resp. rate 17, height 6' 3.75" (1.924 m), weight 233 lb 11.2 oz (106 kg), SpO2 100 %.    HEENT: No thrush or ulcers. Resp: Lungs clear bilaterally. Cardio: Regular rate and rhythm. GI: Abdomen soft and nontender.  No hepatomegaly.  No mass. Vascular: No leg edema. Port-A-Cath without erythema.  Lab Results:  Lab Results  Component Value Date   WBC 4.0 03/14/2018   HGB 12.8 (L) 03/14/2018   HCT 37.7 (L) 03/14/2018   MCV 90.0 03/14/2018   PLT 104 (L) 03/14/2018   NEUTROABS 2.8 03/14/2018    Imaging:  No results found.  Medications: I have reviewed the patient's current medications.  Assessment/Plan: 1. Pancreas head mass  CT abdomen/pelvis 08/03/2017-fullness in the head of the pancreas with stranding in the peripancreatic fat and pancreatic ductal dilatation.   MRI abdomen 08/04/2017-findings favored to represent acute on chronic pancreatitis; equivocal soft tissue fullness within the head and uncinate process of the pancreas; indeterminate too small to characterize right hepatic lobe lesion; small volume abdominal ascites; splenomegaly.   CA-19-9 elevated at 977on 08/04/2017.  Status postupper EUS 08/10/2017-findings of an irregular masslike region in the pancreatic head measuring approximately 2.7 cm. This directly abutted the main portal vein but no other major vascular  structures. Biopsies obtained with preliminary cytology positive for malignancy, likely adenocarcinoma. The final report is pending. The common bile duct and main pancreatic duct were both dilated. ERCP was then proceeded with forstent placement. Multiple attempts were made tocannulate the bile duct without success. The procedure was aborted.  08/16/2017 ERCP with placement of a metal biliary stent in the common bile duct by Dr. Francella Solian at [facility].  Cycle 1 FOLFIRINOX 08/31/2017  Cycle 2 FOLFIRINOX 09/14/2017  Cycle 3 FOLFIRINOX 09/28/2017  Cycle 4 FOLFIRINOX 10/12/2017 (oxaliplatin dose reduced secondary to thrombocytopenia)  Cycle 5 FOLFIRINOX 10/26/2017  Cycle 6 FOLFIRINOX 11/09/2017  Restaging CTs at [facility] 11/29/2017-no definitive evidence of distant metastatic disease. 2 subcentimeter liver lesions described on prior MRI not visualized on CT. Ill-defined pancreatic head mass stable to decreased in size measuring 2.1 x 2 cm. Peripancreatic inflammatory stranding decreased from prior. No biliary ductal dilatation. Celiac axis less than 180 degrees abutment. Common hepatic artery with greater than 180 degrees encasement; superior mesenteric artery with short segment less than 180 degrees abutment; portal vein/superior mesenteric vein with greater than 180 degrees encasement of the extrahepatic portal vein with associated circumferential narrowing at the portomesenteric, overall substantially improved from prior examination. Now less than 180 degree abutment of the SMV. The portal vein and SMV remain patent. Splenic vein patent.  Cycle 1 gemcitabine/Abraxane 12/06/2017  Cycle 2 gemcitabine/Abraxane 12/20/2017  Cycle 3 gemcitabine/Abraxane 01/03/2018  Cycle 4 gemcitabine/Abraxane 01/17/2018  Cycle 5 gemcitabine 01/31/2018 (Abraxane held due to neuropathy)  Cycle 6 gemcitabine 02/14/2018 (Abraxane held due to neuropathy)  Cycle 7 gemcitabine  02/28/2018 (Abraxane held due to neuropathy)  CT  chest/abdomen/pelvis at [facility] 03/08/2018- stable appearance of previously identified infiltrating pancreatic head mass. No CT evidence of metastatic disease.  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  Ill-defined pancreatic head mass not felt to be significantly changed.  CT chest/abdomen/pelvis 05/10/2018- ill-defined hypoattenuating pancreatic head mass and ill-defined soft tissue abutting the celiac axis with mildly increased dilatation of the main pancreatic duct.  New soft tissue nodule along the right upper lobe bronchus; hypoattenuating liver lesions concerning for metastasis. 2. Hypertension 3. Port-A-Cath placement 08/30/2017 4. History of elevated bilirubin-questionGilbert's syndrome 5. Mild leukopenia, thrombocytopenia-question secondary to portal hypertension/splenomegaly;09/14/2017 white count improved, platelet count stable;progressive thrombocytopenia following gemcitabine/Abraxane 6. Kidney stones 7. Fever following cycles 2 and 3 gemcitabine/Abraxane,? Fever related to gemcitabine    Disposition: Cory Kelly appears stable.  Recent restaging evaluation at [facility] shows possible progression in the lung and liver.  The CA-19-9 tumor marker was considerably higher.  We are requesting the CT and MRI images from [facility] for review here.  We will obtain a repeat CA-19-9 as well.  Dr. Benay Spice recommends consideration be given to a biopsy pending review of the scans.  If a biopsy is proceeded with we will request foundation 1  testing.  Cory Kelly will return for a follow-up visit on 05/25/2018.  He will contact the office in the interim with any problems.  Patient seen with Dr. Benay Spice.  25 minutes were spent face-to-face at today's visit with the majority of that time involved in counseling/coordination of care.    Ned Card ANP/GNP-BC   05/14/2018  11:12 AM This was a shared visit with Ned Card.  Cory Kelly has completed a course of "neoadjuvant chemotherapy and radiation.  The restaging CTs and abdominal MRI at [facility] suggest disease progression in the lung and liver.  I discussed the imaging findings in conjunction with the elevated CA 19-9 with Cory Kelly and his wife.  They understand no therapy will be curative if he has metastatic disease.  We decided to pursue a diagnostic biopsy if this is possible.  I will review the CT and MRI images with our interventional radiologist to see if there is a target lesion for biopsy.  If he has metastatic disease we will decide on continued observation versus a trial of salvage systemic therapy.  I recommend FOLFIRINOX since he did not progression on this regimen.  We will also investigate clinical trial availability.  We will submit tissue for molecular testing if he undergoes a diagnostic biopsy.  He will return for an office visit and further discussion in approximately 2 weeks.  Julieanne Manson, MD

## 2018-05-14 NOTE — Telephone Encounter (Signed)
Scheduled appt per 11/11 los - pt is aware of appts added. - no print out wanted -

## 2018-05-15 ENCOUNTER — Encounter: Payer: Self-pay | Admitting: Nurse Practitioner

## 2018-05-15 ENCOUNTER — Telehealth: Payer: Self-pay

## 2018-05-15 LAB — CANCER ANTIGEN 19-9: CA 19-9: 698 U/mL — ABNORMAL HIGH (ref 0–35)

## 2018-05-15 NOTE — Telephone Encounter (Signed)
Called and spoke to Cory Kelly to answer questions she had from her apt with [name]/ Dr. Benay Spice yesterday. I explained need for tissue for Foundation One to make accurate tx plan. Patient voiced understanding. I encouraged her to reach out to me with questions or concerns.

## 2018-05-15 NOTE — Progress Notes (Signed)
  Oncology Nurse Navigator Documentation  Mailed information on "[email or web address] with cancer" support group.

## 2018-05-16 ENCOUNTER — Other Ambulatory Visit: Payer: Self-pay | Admitting: Nurse Practitioner

## 2018-05-16 DIAGNOSIS — C250 Malignant neoplasm of head of pancreas: Secondary | ICD-10-CM

## 2018-05-18 ENCOUNTER — Other Ambulatory Visit: Payer: Self-pay | Admitting: Radiology

## 2018-05-21 ENCOUNTER — Ambulatory Visit (HOSPITAL_COMMUNITY)
Admission: RE | Admit: 2018-05-21 | Discharge: 2018-05-21 | Disposition: A | Discharge status: Home / Self Care | Payer: BLUE CROSS/BLUE SHIELD | Source: Ambulatory Visit | Attending: Nurse Practitioner | Admitting: Nurse Practitioner

## 2018-05-21 ENCOUNTER — Ambulatory Visit
Admission: RE | Admit: 2018-05-21 | Discharge: 2018-05-21 | Disposition: A | Discharge status: Home / Self Care | Payer: Self-pay | Source: Ambulatory Visit | Attending: Oncology | Admitting: Oncology

## 2018-05-21 ENCOUNTER — Encounter (HOSPITAL_COMMUNITY): Payer: Self-pay

## 2018-05-21 ENCOUNTER — Encounter: Payer: Self-pay | Admitting: Oncology

## 2018-05-21 DIAGNOSIS — Z9221 Personal history of antineoplastic chemotherapy: Secondary | ICD-10-CM | POA: Insufficient documentation

## 2018-05-21 DIAGNOSIS — I10 Essential (primary) hypertension: Secondary | ICD-10-CM | POA: Diagnosis not present

## 2018-05-21 DIAGNOSIS — Z79899 Other long term (current) drug therapy: Secondary | ICD-10-CM | POA: Diagnosis not present

## 2018-05-21 DIAGNOSIS — C250 Malignant neoplasm of head of pancreas: Secondary | ICD-10-CM

## 2018-05-21 DIAGNOSIS — Z923 Personal history of irradiation: Secondary | ICD-10-CM | POA: Insufficient documentation

## 2018-05-21 DIAGNOSIS — Z9889 Other specified postprocedural states: Secondary | ICD-10-CM | POA: Diagnosis not present

## 2018-05-21 DIAGNOSIS — Z85828 Personal history of other malignant neoplasm of skin: Secondary | ICD-10-CM | POA: Diagnosis not present

## 2018-05-21 DIAGNOSIS — C229 Malignant neoplasm of liver, not specified as primary or secondary: Secondary | ICD-10-CM | POA: Diagnosis not present

## 2018-05-21 DIAGNOSIS — K7689 Other specified diseases of liver: Secondary | ICD-10-CM | POA: Diagnosis not present

## 2018-05-21 LAB — PROTIME-INR
INR: 1
Prothrombin Time: 13.1 seconds (ref 11.4–15.2)

## 2018-05-21 LAB — COMPREHENSIVE METABOLIC PANEL
ALBUMIN: 4 g/dL (ref 3.5–5.0)
ALT: 40 U/L (ref 0–44)
AST: 25 U/L (ref 15–41)
Alkaline Phosphatase: 111 U/L (ref 38–126)
Anion gap: 7 (ref 5–15)
BILIRUBIN TOTAL: 2 mg/dL — AB (ref 0.3–1.2)
BUN: 13 mg/dL (ref 8–23)
CALCIUM: 8.8 mg/dL — AB (ref 8.9–10.3)
CO2: 28 mmol/L (ref 22–32)
Chloride: 106 mmol/L (ref 98–111)
Creatinine, Ser: 0.89 mg/dL (ref 0.61–1.24)
GFR calc Af Amer: >60 mL/min (ref >60)
GFR calc non Af Amer: >60 mL/min (ref >60)
GLUCOSE: 93 mg/dL (ref 70–99)
Potassium: 4 mmol/L (ref 3.5–5.1)
Sodium: 141 mmol/L (ref 135–145)
TOTAL PROTEIN: 6.4 g/dL — AB (ref 6.5–8.1)

## 2018-05-21 LAB — GLUCOSE, CAPILLARY: Glucose-Capillary: 78 mg/dL (ref 70–99)

## 2018-05-21 LAB — CBC WITH DIFFERENTIAL/PLATELET
ABS IMMATURE GRANULOCYTES: 0.02 10*3/uL (ref 0.00–0.07)
BASOS ABS: 0 10*3/uL (ref 0.0–0.1)
BASOS PCT: 0 %
EOS ABS: 0.1 10*3/uL (ref 0.0–0.5)
Eosinophils Relative: 2 %
HCT: 38.8 % — ABNORMAL LOW (ref 39.0–52.0)
Hemoglobin: 13.3 g/dL (ref 13.0–17.0)
IMMATURE GRANULOCYTES: 0 %
LYMPHS ABS: 0.7 10*3/uL (ref 0.7–4.0)
Lymphocytes Relative: 15 %
MCH: 29.5 pg (ref 26.0–34.0)
MCHC: 34.3 g/dL (ref 30.0–36.0)
MCV: 86 fL (ref 80.0–100.0)
MONOS PCT: 9 %
Monocytes Absolute: 0.4 10*3/uL (ref 0.1–1.0)
NEUTROS ABS: 3.3 10*3/uL (ref 1.7–7.7)
NEUTROS PCT: 74 %
NRBC: 0 % (ref 0.0–0.2)
PLATELETS: 106 10*3/uL — AB (ref 150–400)
RBC: 4.51 MIL/uL (ref 4.22–5.81)
RDW: 13 % (ref 11.5–15.5)
WBC: 4.5 10*3/uL (ref 4.0–10.5)

## 2018-05-21 MED ORDER — HEPARIN SOD (PORK) LOCK FLUSH 100 UNIT/ML IV SOLN
INTRAVENOUS | Status: AC
  Filled 2018-05-21: qty 5

## 2018-05-21 MED ORDER — HYDRALAZINE HCL 20 MG/ML IJ SOLN
10.0000 mg | Freq: Once | INTRAMUSCULAR | Status: AC
  Administered 2018-05-21: 10 mg via INTRAVENOUS
  Filled 2018-05-21: qty 1

## 2018-05-21 MED ORDER — LIDOCAINE-EPINEPHRINE (PF) 2 %-1:200000 IJ SOLN
INTRAMUSCULAR | Status: AC | PRN
  Administered 2018-05-21: 10 mL

## 2018-05-21 MED ORDER — MIDAZOLAM HCL 2 MG/2ML IJ SOLN
INTRAMUSCULAR | Status: AC
  Filled 2018-05-21: qty 2

## 2018-05-21 MED ORDER — FENTANYL CITRATE (PF) 100 MCG/2ML IJ SOLN
INTRAMUSCULAR | Status: AC | PRN
  Administered 2018-05-21 (×2): 50 ug via INTRAVENOUS

## 2018-05-21 MED ORDER — SODIUM CHLORIDE 0.9 % IV SOLN
INTRAVENOUS | Status: DC
  Administered 2018-05-21: 13:00:00 via INTRAVENOUS

## 2018-05-21 MED ORDER — FENTANYL CITRATE (PF) 100 MCG/2ML IJ SOLN
INTRAMUSCULAR | Status: AC
  Filled 2018-05-21: qty 2

## 2018-05-21 MED ORDER — GELATIN ABSORBABLE 12-7 MM EX MISC
CUTANEOUS | Status: AC
  Filled 2018-05-21: qty 1

## 2018-05-21 MED ORDER — HEPARIN SOD (PORK) LOCK FLUSH 100 UNIT/ML IV SOLN
500.0000 [IU] | Freq: Once | INTRAVENOUS | Status: AC
  Administered 2018-05-21: 500 [IU]

## 2018-05-21 MED ORDER — MIDAZOLAM HCL 2 MG/2ML IJ SOLN
INTRAMUSCULAR | Status: AC | PRN
  Administered 2018-05-21 (×2): 1 mg via INTRAVENOUS

## 2018-05-21 MED ORDER — LIDOCAINE-EPINEPHRINE (PF) 2 %-1:200000 IJ SOLN
INTRAMUSCULAR | Status: AC
  Filled 2018-05-21: qty 20

## 2018-05-21 NOTE — Procedures (Signed)
Pre Procedure Dx: History of pancreatic cancer, now with indeterminate liver lesion worrisome for mets Post Procedural Dx: Same  Technically successful US guided biopsy of indeterminate lesion adjacent to the gall bladder.   EBL: None  No immediate complications.   Ronny Bacon, MD Pager #: [phone]

## 2018-05-21 NOTE — Progress Notes (Signed)
  Oncology Nurse Navigator Documentation  Requested (via Fax) that MRI and CTs  (05/10/18 and 05/09/18) from [facility] be loaded to [employer].

## 2018-05-21 NOTE — H&P (Signed)
Referring Physician(s): Sherrill,B  Supervising Physician: Sandi Mariscal  Patient Status:  [facility]  Chief Complaint: "I'm having a liver biopsy"   Subjective: Patient familiar to IR service from prior Port-A-Cath placement on 08/30/17.  He has a history of pancreatic cancer with prior chemoradiation and common bile duct stent placement at [facility].  Restaging evaluation at [facility] shows possible progression in the lung and liver and rise in CA-19-9.  He presents today for image guided liver lesion biopsy for further evaluation.  He currently denies fever, chest pain, dyspnea, cough, abdominal/back pain, nausea, vomiting or bleeding.  He does have occasional headaches.  Past Medical History:  Diagnosis Date  . Abdominal pain    due to bloating  . Bloating   . Cancer ([place])    skin cancer  . Essential hypertension   . Fatigue   . History of weight loss   . HOH (hard of hearing)    no hearing aids  . Neuralgia   . Stress    loss of father in Dec 2018.   Past Surgical History:  Procedure Laterality Date  . APPENDECTOMY    . ENDOSCOPIC RETROGRADE CHOLANGIOPANCREATOGRAPHY (ERCP) WITH PROPOFOL N/A 08/10/2017   Procedure: ENDOSCOPIC RETROGRADE CHOLANGIOPANCREATOGRAPHY (ERCP) WITH PROPOFOL;  Surgeon: Milus Banister, MD;  Location: [facility] ENDOSCOPY;  Service: Endoscopy;  Laterality: N/A;  . EUS N/A 08/10/2017   Procedure: UPPER ENDOSCOPIC ULTRASOUND (EUS) RADIAL;  Surgeon: Milus Banister, MD;  Location: [facility] ENDOSCOPY;  Service: Endoscopy;  Laterality: N/A;  . EYE SURGERY     lasik/left eye  . IR FLUORO GUIDE PORT INSERTION RIGHT  08/30/2017  . IR US GUIDE VASC ACCESS RIGHT  08/30/2017  . SKIN CANCER EXCISION      Allergies: Patient has no known allergies.  Medications: Prior to Admission medications   Medication Sig Start Date End Date Taking? Authorizing Provider  acetaminophen (TYLENOL) 500 MG tablet Take 500-1,000 mg by mouth every 6 (six) hours as needed for moderate pain.    [provider]  cyclobenzaprine (FLEXERIL) 5 MG tablet Take 1 tablet (5 mg total) by mouth every 12 (twelve) hours as needed for muscle spasms. 10/05/17   Ladell Pier, MD  fluticasone [facility]) 50 MCG/ACT nasal spray USE 2 SPRAYS IN NOSTRIL DAILY 01/26/18   Weber, Damaris Hippo, PA-C  gabapentin (NEURONTIN) 100 MG capsule Take 100 mg in am and 200 mg at bedtime 05/03/18   Ladell Pier, MD  ibuprofen (ADVIL,MOTRIN) 600 MG tablet Take 1 tablet (600 mg total) by mouth every 6 (six) hours as needed. Patient taking differently: Take 600 mg by mouth every 6 (six) hours as needed.  12/07/17   Fatima Blank, MD  lidocaine-prilocaine (EMLA) cream Apply 1 application topically as needed. Apply to portacath site 1-2 hours prior to use Patient not taking: Reported on 05/14/2018 01/03/18   Ladell Pier, MD  olmesartan (BENICAR) 40 MG tablet Take 1 tablet (40 mg total) by mouth daily. 04/04/18   Gale Journey, Damaris Hippo, PA-C  potassium chloride (MICRO-K) 10 MEQ CR capsule Take 20 meq (2 tabs) in the morning and 10 meq (1 tab) in the evening. 05/03/18   Ladell Pier, MD  prochlorperazine (COMPAZINE) 10 MG tablet Take 1 tablet (10 mg total) by mouth every 6 (six) hours as needed for nausea or vomiting. Patient not taking: Reported on 05/14/2018 08/21/17   Owens Shark, NP  tamsulosin (FLOMAX) 0.4 MG CAPS capsule Take 1 capsule (0.4 mg total)  by mouth daily. 11/21/17   Ladell Pier, MD  traMADol (ULTRAM) 50 MG tablet Take 1 tablet (50 mg total) by mouth every 8 (eight) hours as needed. 01/18/18   Ladell Pier, MD  triamcinolone cream (KENALOG) 0.5 % Apply 1 application topically 2 (two) times daily. 12/07/17   Weber, Damaris Hippo, PA-C     Vital Signs: Blood pressure 180/97, heart rate 51, temp 98.2, respirations 16, O2 sat 99% room air   Physical Exam awake, alert.  Chest clear to auscultation bilaterally.  Clean, intact right chest wall Port-A-Cath.  Heart with slightly bradycardic but regular rhythm.   Abdomen soft, positive bowel sounds, nontender.  No lower extremity edema.  Imaging: No results found.  Labs:  CBC: Recent Labs    01/31/18 1131 02/14/18 0909 02/28/18 0936 03/14/18 0840  WBC 5.2 4.0 4.7 4.0  HGB 11.3* 11.6* 12.4* 12.8*  HCT 33.0* 34.3* 35.9* 37.7*  PLT 106* 93* 95* 104*    COAGS: Recent Labs    08/30/17 0750  INR 1.11    BMP: Recent Labs    01/19/18 1131 01/31/18 1131 02/14/18 0909 02/28/18 0936  NA 135 138 142 142  K 4.4 4.3 4.0 4.2  CL 103 104 106 106  CO2 27 28 26 28   GLUCOSE 97 85 145* 114*  BUN 10 11 11 14   CALCIUM 8.6* 8.8* 8.5* 9.1  CREATININE 0.77 0.80 0.92 0.99  GFRNONAA >60 >60 >60 >60  GFRAA >60 >60 >60 >60    LIVER FUNCTION TESTS: Recent Labs    01/19/18 1131 01/31/18 1131 02/14/18 0909 02/28/18 0936  BILITOT 2.6* 1.4* 1.4* 1.3*  AST 68* 44* 25 18  ALT 93* 75* 38 22  ALKPHOS 261* 259* 196* 148*  PROT 6.2* 6.2* 5.9* 6.1*  ALBUMIN 3.5 3.6 3.5 3.6    Assessment and Plan:  Pt with history of pancreatic cancer with prior chemoradiation and common bile duct stent placement at [facility].  Restaging evaluation at [facility] shows possible progression in the lung and liver and rise in CA-19-9.  He presents today for image guided liver lesion biopsy for further evaluation. Risks and benefits discussed with the patient/family including, but not limited to bleeding, infection, damage to adjacent structures or low yield requiring additional tests.  All of the patient's questions were answered, patient is agreeable to proceed. Consent signed and in chart.  LABS PENDING   Electronically Signed: D. Rowe Robert, PA-C 05/21/2018, 11:58 AM   I spent a total of 25 minutes at the the patient's bedside AND on the patient's hospital floor or unit, greater than 50% of which was counseling/coordinating care for image guided liver lesion biopsy

## 2018-05-21 NOTE — Discharge Instructions (Addendum)
Pancreatic Cancer Pancreatic cancer is an abnormal growth of cells (tumor) in the pancreas that is cancerous (malignant). The pancreas is a gland located in the abdomen, between the stomach and the spine. The pancreas makes hormones, including insulin and glucagon. These hormones control your blood sugar and help your body use and store energy that comes from food. The pancreas also makes pancreatic juices that help digest food. There are two types of pancreatic cancer:  Exocrine. This is the most common type.  Endocrine.  The different types of cancer have their own causes, risk factors, and treatment. Pancreatic cancer can spread (metastasize) to other parts of the body. What are the causes? The exact cause of this condition is not known. What increases the risk? The following factors may make you more likely to develop this condition:  Age. The risk of pancreatic cancer increases with age. This condition most commonly occurs in people over the age of 44.  Tobacco use, including cigarettes, chewing tobacco, or e-cigarettes.  Being male.  Being African American.  Having a family history of cancer of the pancreas, colon, or ovaries.  Having diabetes, especially if you were diagnosed as an adult.  Having chronic pancreatitis.  Being exposed to certain chemicals.  Being obese and having a decreased level of physical activity.  Eating a diet that is high in fat and red meat.  Having an infection in your stomach caused by a strain of bacteria (Helicobacter pylori or H. pylori). This infection can lead to ulcers.  Having certain hereditary conditions or gene mutations.  Cirrhosis. This is the damage and scarring of the liver, which may be caused by hepatitis or heavy alcohol use.  What are the signs or symptoms? In the early stages, there are often no symptoms of this condition. As the cancer gets worse (progresses), symptoms may vary depending on the type of pancreatic cancer  you have. Symptoms include:  Yellowing of the skin or eyes (jaundice).  Weakness.  Abdominal pain.  Diarrhea.  Depression.  Loss of appetite and weight loss.  Indigestion.  Pain in the upper abdomen or upper back.  A lump under the rib cage on the right side.  Nausea and vomiting.  Fatigue.  Stools that are light-colored and greasy-looking, or stools that are black and tarry-looking.  Dark urine.  High blood sugar (hyperglycemia). This may cause increased thirst and frequent urination.  Low blood sugar (hypoglycemia). This may cause confusion, sweating, and a fast heartbeat.  Itchy skin.  Skin that feels uneven.  How is this diagnosed? This condition may be diagnosed based on your medical history and a physical exam. This may include checking your skin and eyes for signs of jaundice and checking your abdomen for any changes in the areas near the pancreas. Your health care provider will also check for a collection of excess fluid in the abdomen (ascites). Tests will also be done, such as:  Blood tests.  Urine tests.  Ultrasound.  CT scan.  MRI.  Removal of a sample of pancreatic tissue to be examined under a microscope (biopsy).  Other tests and procedures may also be done. If pancreatic cancer is confirmed, it will be staged to determine its severity and extent. Staging is an assessment of:  The size of the tumor.  Whether the cancer has spread.  Where the cancer has spread.  How is this treated? Depending on the type and stage of your pancreatic cancer, treatment may include:  Surgery to remove all or  part of the pancreas, or to remove a tumor.  Radiation therapy. This is the use of high-energy rays to kill cancer cells.  Chemotherapy. This is the use of medicines to stop or slow the growth of cancer cells.  Targeted therapy. This treatment targets specific parts of cancer cells and the area around them to block the growth and spread of the  cancer.  Your health care provider may recommend a combination of surgery, radiation therapy, and chemotherapy. You may be referred to a health care provider who specializes in cancer (oncologist). Follow these instructions at home: Medicines  Take over-the-counter and prescription medicines only as told by your health care provider.  Do not drive or operate heavy machinery while taking prescription pain medicine. General instructions  Return to your normal activities as told by your health care provider. Ask your health care provider what activities are safe for you.  Do not use tobacco products, including cigarettes, chewing tobacco, or e-cigarettes. If you need help quitting, ask your health care provider.  Maintain a healthy diet.  Drink enough fluid to keep your urine clear or pale yellow.  Consider joining a support group. This may help you learn to cope with the stress of having pancreatic cancer.  Work with your health care provider to manage any side effects of your treatment.  Keep all follow-up visits as told by your health care provider. This is important. Contact a health care provider if:  You feel nauseous or you vomit.  You have unexplained weight loss. Get help right away if:  You have pain that suddenly gets worse.  You have chest pain or an irregular heartbeat.  You have trouble breathing. You cannot eat or drink without vomiting.  You have blood in your vomit or dark, tarry stools.  Your skin or eyes turn more yellow.  You develop new fatigue or weakness.  You have abdominal bloating or pain. This information is not intended to replace advice given to you by your health care provider. Make sure you discuss any questions you have with your health care provider. Document Released: 06/04/2004 Document Revised: 11/26/2015 Document Reviewed: 06/01/2015 Elsevier Interactive Patient Education  2018 [employer]. Liver Biopsy, Care After These instructions  give you information on caring for yourself after your procedure. Your doctor may also give you more specific instructions. Call your doctor if you have any problems or questions after your procedure. Follow these instructions at home:  Rest at home for 1-2 days or as told by your doctor.  Have someone stay with you for at least 24 hours.  Do not do these things in the first 24 hours: ? Drive. ? Use machinery. ? Take care of other people. ? Sign legal documents. ? Take a bath or shower.  There are many different ways to close and cover a cut (incision). For example, a cut can be closed with stitches, skin glue, or adhesive strips. Follow your doctor's instructions on: ? Taking care of your cut. ? Changing and removing your bandage (dressing). May remove dressing and shower in 24 hours. Keep site clean and dry. ? Removing whatever was used to close your cut.  Do not drink alcohol in the first week.  Do not lift more than 5 pounds or play contact sports for the first 2 weeks.  Take medicines only as told by your doctor. For 1 week, do not take medicine that has aspirin in it or medicines like ibuprofen.  Get your test results. Contact a  doctor if:  A cut bleeds and leaves more than just a small spot of blood.  A cut is red, puffs up (swells), or hurts more than before.  Fluid or something else comes from a cut.  A cut smells bad.  You have a fever or chills. Get help right away if:  You have swelling, bloating, or pain in your belly (abdomen).  You get dizzy or faint.  You have a rash.  You feel sick to your stomach (nauseous) or throw up (vomit).  You have trouble breathing, feel short of breath, or feel faint.  Your chest hurts.  You have problems talking or seeing.  You have trouble balancing or moving your arms or legs. This information is not intended to replace advice given to you by your health care provider. Make sure you discuss any questions you have with  your health care provider. Document Released: 03/29/2008 Document Revised: 11/26/2015 Document Reviewed: 08/16/2013 Elsevier Interactive Patient Education  2018 [place]. Moderate Conscious Sedation, Adult, Care After These instructions provide you with information about caring for yourself after your procedure. Your health care provider may also give you more specific instructions. Your treatment has been planned according to current medical practices, but problems sometimes occur. Call your health care provider if you have any problems or questions after your procedure. What can I expect after the procedure? After your procedure, it is common:  To feel sleepy for several hours.  To feel clumsy and have poor balance for several hours.  To have poor judgment for several hours.  To vomit if you eat too soon.  Follow these instructions at home: For at least 24 hours after the procedure:   Do not: ? Participate in activities where you could fall or become injured. ? Drive. ? Use heavy machinery. ? Drink alcohol. ? Take sleeping pills or medicines that cause drowsiness. ? Make important decisions or sign legal documents. ? Take care of children on your own.  Rest. Eating and drinking  Follow the diet recommended by your health care provider.  If you vomit: ? Drink water, juice, or soup when you can drink without vomiting. ? Make sure you have little or no nausea before eating solid foods. General instructions  Have a responsible adult stay with you until you are awake and alert.  Take over-the-counter and prescription medicines only as told by your health care provider.  If you smoke, do not smoke without supervision.  Keep all follow-up visits as told by your health care provider. This is important. Contact a health care provider if:  You keep feeling nauseous or you keep vomiting.  You feel light-headed.  You develop a rash.  You have a fever. Get help right  away if:  You have trouble breathing. This information is not intended to replace advice given to you by your health care provider. Make sure you discuss any questions you have with your health care provider. Document Released: 04/10/2013 Document Revised: 11/23/2015 Document Reviewed: 10/10/2015 Elsevier Interactive Patient Education  [employer].

## 2018-05-25 ENCOUNTER — Encounter: Payer: Self-pay | Admitting: *Deleted

## 2018-05-25 ENCOUNTER — Inpatient Hospital Stay (HOSPITAL_BASED_OUTPATIENT_CLINIC_OR_DEPARTMENT_OTHER): Payer: BLUE CROSS/BLUE SHIELD | Admitting: Oncology

## 2018-05-25 VITALS — BP 160/87 | HR 63 | Temp 97.4°F | Resp 20 | Ht 75.75 in | Wt 233.5 lb

## 2018-05-25 DIAGNOSIS — C787 Secondary malignant neoplasm of liver and intrahepatic bile duct: Secondary | ICD-10-CM | POA: Diagnosis not present

## 2018-05-25 DIAGNOSIS — C250 Malignant neoplasm of head of pancreas: Secondary | ICD-10-CM | POA: Diagnosis not present

## 2018-05-25 DIAGNOSIS — R970 Elevated carcinoembryonic antigen [CEA]: Secondary | ICD-10-CM | POA: Diagnosis not present

## 2018-05-25 DIAGNOSIS — Z452 Encounter for adjustment and management of vascular access device: Secondary | ICD-10-CM | POA: Diagnosis not present

## 2018-05-25 DIAGNOSIS — R911 Solitary pulmonary nodule: Secondary | ICD-10-CM | POA: Diagnosis not present

## 2018-05-25 DIAGNOSIS — K769 Liver disease, unspecified: Secondary | ICD-10-CM | POA: Diagnosis not present

## 2018-05-25 DIAGNOSIS — I10 Essential (primary) hypertension: Secondary | ICD-10-CM | POA: Diagnosis not present

## 2018-05-25 NOTE — Progress Notes (Signed)
[facility] OFFICE PROGRESS NOTE   Diagnosis: Pancreas cancer  INTERVAL HISTORY:   [name] for a scheduled visit.  No new complaint.  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 Gabapentin has helped.  No abdominal pain. He underwent an ultrasound-guided biopsy of a liver lesion on 05/21/2018.  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  Objective:  Vital signs in last 24 hours:  Blood pressure (!) 160/87, pulse 63, temperature (!) 97.4 F (36.3 C), temperature source Oral, resp. rate 20, height 6' 3.75" (1.924 m), weight 233 lb 8 oz (105.9 kg), SpO2 100 %.    Resp: Lungs clear bilaterally Cardio: Regular rate and rhythm GI: No mass, no hepatosplenomegaly, nontender Vascular: No leg edema   Portacath/PICC-without erythema  Lab Results:  Lab Results  Component Value Date   WBC 4.5 05/21/2018   HGB 13.3 05/21/2018   HCT 38.8 (L) 05/21/2018   MCV 86.0 05/21/2018   PLT 106 (L) 05/21/2018   NEUTROABS 3.3 05/21/2018    CMP  Lab Results  Component Value Date   NA 141 05/21/2018   K 4.0 05/21/2018   CL 106 05/21/2018   CO2 28 05/21/2018   GLUCOSE 93 05/21/2018   BUN 13 05/21/2018   CREATININE 0.89 05/21/2018   CALCIUM 8.8 (L) 05/21/2018   PROT 6.4 (L) 05/21/2018   ALBUMIN 4.0 05/21/2018   AST 25 05/21/2018   ALT 40 05/21/2018   ALKPHOS 111 05/21/2018   BILITOT 2.0 (H) 05/21/2018   GFRNONAA >60 05/21/2018   GFRAA >60 05/21/2018    No results found for: CEA1  Lab Results  Component Value Date   INR 1.00 05/21/2018    Imaging:  US Biopsy (liver)  Result Date: 05/21/2018 INDICATION: History of pancreatic cancer, now with indeterminate liver lesion worrisome for metastatic disease. Please perform ultrasound-guided liver lesion biopsy for tissue diagnostic purposes. EXAM: ULTRASOUND GUIDED LIVER LESION BIOPSY COMPARISON:  Report from outside abdominal MRI  performed 05/09/2018 reported development of an indeterminate nodule adjacent to the superior aspect of the gallbladder MEDICATIONS: None ANESTHESIA/SEDATION: Fentanyl 100 mcg IV; Versed 2 mg IV Total Moderate Sedation time:  14 Minutes. The patient's level of consciousness and vital signs were monitored continuously by radiology nursing throughout the procedure under my direct supervision. COMPLICATIONS: None immediate. PROCEDURE: Informed written consent was obtained from the patient after a discussion of the risks, benefits and alternatives to treatment. The patient understands and consents the procedure. A timeout was performed prior to the initiation of the procedure. Ultrasound scanning was performed of the right upper abdominal quadrant demonstrates an approximately 1.5 x 1.4 cm mixed echogenic nodule adjacent to the dome of the gallbladder, likely compatible with the described new liver lesion on report from outside abdominal MRI. The procedure was planned. The right upper abdominal quadrant was prepped and draped in the usual sterile fashion. The overlying soft tissues were anesthetized with 1% lidocaine with epinephrine. A 17 gauge, 6.8 cm co-axial needle was advanced into a peripheral aspect of the lesion. This was followed by 4 core biopsies with an 18 gauge core device under direct ultrasound guidance. The coaxial needle tract was embolized with a small amount of Gel-Foam slurry and superficial hemostasis was obtained with manual compression. Post procedural scanning was negative for definitive area of hemorrhage or additional complication. A dressing was placed. The patient tolerated the procedure well without immediate post procedural complication. IMPRESSION: Technically successful ultrasound guided core needle  biopsy of indeterminate lesion adjacent to the dome of the gallbladder. Electronically Signed   By: Sandi Mariscal M.D.   On: 05/21/2018 16:20    Medications: I have reviewed the patient's  current medications.   Assessment/Plan:  1. Pancreas head mass  CT abdomen/pelvis 08/03/2017-fullness in the head of the pancreas with stranding in the peripancreatic fat and pancreatic ductal dilatation.   MRI abdomen 08/04/2017-findings favored to represent acute on chronic pancreatitis; equivocal soft tissue fullness within the head and uncinate process of the pancreas; indeterminate too small to characterize right hepatic lobe lesion; small volume abdominal ascites; splenomegaly.   CA-19-9 elevated at 977on 08/04/2017.  Status postupper EUS 08/10/2017-findings of an irregular masslike region in the pancreatic head measuring approximately 2.7 cm. This directly abutted the main portal vein but no other major vascular structures. Biopsies obtained with preliminary cytology positive for malignancy, likely adenocarcinoma. The final report is pending. The common bile duct and main pancreatic duct were both dilated. ERCP was then proceeded with forstent placement. Multiple attempts were made tocannulate the bile duct without success. The procedure was aborted.  08/16/2017 ERCP with placement of a metal biliary stent in the common bile duct by Dr. Francella Solian at [facility].  Cycle 1 FOLFIRINOX 08/31/2017  Cycle 2 FOLFIRINOX 09/14/2017  Cycle 3 FOLFIRINOX 09/28/2017  Cycle 4 FOLFIRINOX 10/12/2017 (oxaliplatin dose reduced secondary to thrombocytopenia)  Cycle 5 FOLFIRINOX 10/26/2017  Cycle 6 FOLFIRINOX 11/09/2017  Restaging CTs at [facility] 11/29/2017-no definitive evidence of distant metastatic disease. 2 subcentimeter liver lesions described on prior MRI not visualized on CT. Ill-defined pancreatic head mass stable to decreased in size measuring 2.1 x 2 cm. Peripancreatic inflammatory stranding decreased from prior. No biliary ductal dilatation. Celiac axis less than 180 degrees abutment. Common hepatic artery with greater than 180 degrees encasement; superior mesenteric artery with short segment less  than 180 degrees abutment; portal vein/superior mesenteric vein with greater than 180 degrees encasement of the extrahepatic portal vein with associated circumferential narrowing at the portomesenteric, overall substantially improved from prior examination. Now less than 180 degree abutment of the SMV. The portal vein and SMV remain patent. Splenic vein patent.  Cycle 1 gemcitabine/Abraxane 12/06/2017  Cycle 2 gemcitabine/Abraxane 12/20/2017  Cycle 3 gemcitabine/Abraxane 01/03/2018  Cycle 4 gemcitabine/Abraxane 01/17/2018  Cycle 5 gemcitabine 01/31/2018 (Abraxane held due to neuropathy)  Cycle 6 gemcitabine 02/14/2018 (Abraxane held due to neuropathy)  Cycle 7 gemcitabine 02/28/2018 (Abraxane held due to neuropathy)  [facility]abdomen[facility]vis at [facility] 03/08/2018- stable appearance of previously identified infiltrating pancreatic head mass. No CT evidence of metastatic disease.  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  Ill-defined pancreatic head mass not felt to be significantly changed.  CT chest/abdomen/pelvis 05/10/2018- ill-defined hypoattenuating pancreatic head mass and ill-defined soft tissue abutting the celiac axis with mildly increased dilatation of the main pancreatic duct.  New soft tissue nodule along the right upper lobe bronchus; hypoattenuating liver lesions concerning for metastasis.  Ultrasound-guided biopsy of a liver lesion adjacent to the dome of the gallbladder 05/22/2018-mucinous adenocarcinoma consistent with  pancreatic adenocarcinoma 2. Hypertension 3. Port-A-Cath placement 08/30/2017 4. History of  elevated bilirubin-questionGilbert's syndrome 5. Mild leukopenia, thrombocytopenia-question secondary to portal hypertension/splenomegaly;09/14/2017 white count improved, platelet count stable;progressive thrombocytopenia following gemcitabine/Abraxane 6. Kidney stones 7. Fever following cycles 2 and 3 gemcitabine/Abraxane,? Fever related to gemcitabine    Disposition: Mr. Bogdon has been nodes with metastatic pancreas cancer.  I discussed the biopsy finding with Mr. Highfill and his wife.  He understands no therapy will be curative and surgery for removal of the pancreas primary is indicated. We discussed treatment options including observation, repeat treatment with FOLFIRINOX, and salvage therapy with liposomal irinotecan or gemcitabine/cisplatin.  He appears asymptomatic from the cancer at present.  We decided to follow him with observation for now.  We will submit the liver biopsy material for Foundation 1 testing.  Mr. Loveland will return for an office visit and further discussion in 2 weeks.  Betsy Coder, MD  05/25/2018  2:19 PM

## 2018-05-25 NOTE — Progress Notes (Signed)
Call to [facility] Pathology department and spoke with Maudie Mercury, to request Foundation One testing on case [id number]. Requested it be sent out today if possible. Per pathology, unable to send out today. Will not be until Monday.

## 2018-05-29 ENCOUNTER — Telehealth: Payer: Self-pay | Admitting: *Deleted

## 2018-05-29 NOTE — Telephone Encounter (Signed)
Received refill request for K+ from [facility]. Notified wife that as long as patient is not having diarrhea will stop his oral potassium per Dr. Benay Spice.

## 2018-06-04 ENCOUNTER — Telehealth: Payer: Self-pay | Admitting: *Deleted

## 2018-06-04 NOTE — Telephone Encounter (Signed)
Received message from [facility] that patient called last night with low grade fever, headache and abdominal discomfort. Called to follow up on his status today. Wife he reports "he is perfectly fine today. Thanks for checking on [place]",

## 2018-06-06 ENCOUNTER — Ambulatory Visit: Payer: BLUE CROSS/BLUE SHIELD | Admitting: Oncology

## 2018-06-08 ENCOUNTER — Inpatient Hospital Stay: Payer: BLUE CROSS/BLUE SHIELD | Attending: Nurse Practitioner | Admitting: Nurse Practitioner

## 2018-06-08 VITALS — BP 145/79 | HR 60 | Temp 97.8°F | Resp 17 | Ht 75.75 in | Wt 230.4 lb

## 2018-06-08 DIAGNOSIS — R21 Rash and other nonspecific skin eruption: Secondary | ICD-10-CM | POA: Diagnosis not present

## 2018-06-08 DIAGNOSIS — C250 Malignant neoplasm of head of pancreas: Secondary | ICD-10-CM | POA: Diagnosis not present

## 2018-06-08 DIAGNOSIS — I10 Essential (primary) hypertension: Secondary | ICD-10-CM | POA: Insufficient documentation

## 2018-06-08 DIAGNOSIS — C787 Secondary malignant neoplasm of liver and intrahepatic bile duct: Secondary | ICD-10-CM

## 2018-06-08 DIAGNOSIS — R17 Unspecified jaundice: Secondary | ICD-10-CM | POA: Insufficient documentation

## 2018-06-08 NOTE — Progress Notes (Addendum)
[place] OFFICE PROGRESS NOTE   Diagnosis: Pancreas cancer  INTERVAL HISTORY:   Cory Kelly returns as scheduled.  He notes some improvement in the neuropathy symptoms.  This coincides with an increased dose of gabapentin.  He denies significant abdominal pain.  He had a few episodes of pain which resolved with standing.  He reports a good appetite.  He reports a rash at the left abdomen and right lower leg.  He wonders if he has ringworm.  Objective:  Vital signs in last 24 hours:  Blood pressure (!) 145/79, pulse 60, temperature 97.8 F (36.6 C), temperature source Oral, resp. rate 17, height 6' 3.75" (1.924 m), weight 230 lb 6.4 oz (104.5 kg), SpO2 100 %.    HEENT: No thrush or ulcers. Resp: Lungs clear bilaterally. Cardio: Regular rate and rhythm. GI: Abdomen soft and nontender.  No hepatomegaly.  No mass. Vascular: No leg edema. Skin: Mildly erythematous dry patch of skin at the left abdomen.  Small raised mildly erythematous skin lesion at the right lower leg. Port-A-Cath without erythema.   Lab Results:  Lab Results  Component Value Date   WBC 4.5 05/21/2018   HGB 13.3 05/21/2018   HCT 38.8 (L) 05/21/2018   MCV 86.0 05/21/2018   PLT 106 (L) 05/21/2018   NEUTROABS 3.3 05/21/2018    Imaging:  No results found.  Medications: I have reviewed the patient's current medications.  Assessment/Plan: 1. Pancreas head mass  CT abdomen/pelvis 08/03/2017-fullness in the head of the pancreas with stranding in the peripancreatic fat and pancreatic ductal dilatation.   MRI abdomen 08/04/2017-findings favored to represent acute on chronic pancreatitis; equivocal soft tissue fullness within the head and uncinate process of the pancreas; indeterminate too small to characterize right hepatic lobe lesion; small volume abdominal ascites; splenomegaly.   CA-19-9 elevated at 977on 08/04/2017.  Status postupper EUS 08/10/2017-findings of an irregular masslike region  in the pancreatic head measuring approximately 2.7 cm. This directly abutted the main portal vein but no other major vascular structures. Biopsies obtained with preliminary cytology positive for malignancy, likely adenocarcinoma. The final report is pending. The common bile duct and main pancreatic duct were both dilated. ERCP was then proceeded with forstent placement. Multiple attempts were made tocannulate the bile duct without success. The procedure was aborted.  08/16/2017 ERCP with placement of a metal biliary stent in the common bile duct by Dr. Francella Solian at [facility].  Cycle 1 FOLFIRINOX 08/31/2017  Cycle 2 FOLFIRINOX 09/14/2017  Cycle 3 FOLFIRINOX 09/28/2017  Cycle 4 FOLFIRINOX 10/12/2017 (oxaliplatin dose reduced secondary to thrombocytopenia)  Cycle 5 FOLFIRINOX 10/26/2017  Cycle 6 FOLFIRINOX 11/09/2017  Restaging CTs at [facility] 11/29/2017-no definitive evidence of distant metastatic disease. 2 subcentimeter liver lesions described on prior MRI not visualized on CT. Ill-defined pancreatic head mass stable to decreased in size measuring 2.1 x 2 cm. Peripancreatic inflammatory stranding decreased from prior. No biliary ductal dilatation. Celiac axis less than 180 degrees abutment. Common hepatic artery with greater than 180 degrees encasement; superior mesenteric artery with short segment less than 180 degrees abutment; portal vein/superior mesenteric vein with greater than 180 degrees encasement of the extrahepatic portal vein with associated circumferential narrowing at the portomesenteric, overall substantially improved from prior examination. Now less than 180 degree abutment of the SMV. The portal vein and SMV remain patent. Splenic vein patent.  Cycle 1 gemcitabine/Abraxane 12/06/2017  Cycle 2 gemcitabine/Abraxane 12/20/2017  Cycle 3 gemcitabine/Abraxane 01/03/2018  Cycle 4 gemcitabine/Abraxane 01/17/2018  Cycle 5 gemcitabine 01/31/2018 (Abraxane held  due to neuropathy)  Cycle 6  gemcitabine 02/14/2018 (Abraxane held due to neuropathy)  Cycle 7 gemcitabine 02/28/2018 (Abraxane held due to neuropathy)  CT chest/abdomen/pelvis at [facility] 03/08/2018- stable appearance of previously identified infiltrating pancreatic head mass. No CT evidence of metastatic disease.  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 Ill-defined pancreatic head mass not felt to be significantly changed.  CT chest/abdomen/pelvis 05/10/2018- ill-defined hypoattenuating pancreatic head mass and ill-defined soft tissue abutting the celiac axis with mildly increased dilatation of the main pancreatic duct. New soft tissue nodule along the right upper lobe bronchus; hypoattenuating liver lesions concerning for metastasis.  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 Hypertension 3. Port-A-Cath placement 08/30/2017 4. History of elevated bilirubin-questionGilbert's syndrome 5. Mild leukopenia, thrombocytopenia-question secondary to portal hypertension/splenomegaly;09/14/2017 white count improved, platelet count stable;progressive thrombocytopenia following gemcitabine/Abraxane 6. Kidney stones 7. Fever following cycles 2 and 3 gemcitabine/Abraxane,? Fever related to gemcitabine   Disposition: Cory Kelly appears unchanged.  He appears asymptomatic from the pancreas cancer at present. Foundation 1 results are pending.  We will  contact him once results are available.  Dr. Benay Spice again reviewed treatment options including observation, repeat treatment with FOLFIRINOX, liposomal Irinotecan or gemcitabine/cisplatin.  He will return for a follow-up visit on 07/03/2018 for further discussion.  Patient seen with Dr. Benay Spice.    Ned Card ANP/GNP-BC   06/08/2018  11:38 AM This was a shared visit with Ned Card.  We discussed treatment options with Cory Kelly and his wife.  He does not wish to begin systemic therapy at present.  We are waiting on results from [facility] 1 testing.  We will contact him when these results are available next week.  Julieanne Manson, MD

## 2018-06-11 DIAGNOSIS — C259 Malignant neoplasm of pancreas, unspecified: Secondary | ICD-10-CM | POA: Diagnosis not present

## 2018-06-11 DIAGNOSIS — C787 Secondary malignant neoplasm of liver and intrahepatic bile duct: Secondary | ICD-10-CM | POA: Diagnosis not present

## 2018-06-12 ENCOUNTER — Telehealth: Payer: Self-pay | Admitting: *Deleted

## 2018-06-12 NOTE — Telephone Encounter (Signed)
Notified wife that Foundation One results indicated no mutations that would change his treatment per Dr. Benay Spice.

## 2018-06-15 ENCOUNTER — Encounter (HOSPITAL_COMMUNITY): Payer: Self-pay | Admitting: Oncology

## 2018-06-21 ENCOUNTER — Encounter: Payer: Self-pay | Admitting: Nurse Practitioner

## 2018-06-22 ENCOUNTER — Other Ambulatory Visit: Payer: Self-pay

## 2018-06-22 ENCOUNTER — Inpatient Hospital Stay (HOSPITAL_BASED_OUTPATIENT_CLINIC_OR_DEPARTMENT_OTHER): Payer: BLUE CROSS/BLUE SHIELD | Admitting: Oncology

## 2018-06-22 ENCOUNTER — Inpatient Hospital Stay: Payer: BLUE CROSS/BLUE SHIELD

## 2018-06-22 ENCOUNTER — Encounter (HOSPITAL_COMMUNITY): Payer: Self-pay

## 2018-06-22 ENCOUNTER — Inpatient Hospital Stay (HOSPITAL_COMMUNITY): Payer: BLUE CROSS/BLUE SHIELD

## 2018-06-22 ENCOUNTER — Inpatient Hospital Stay (HOSPITAL_COMMUNITY)
Admission: AD | Admit: 2018-06-22 | Discharge: 2018-06-25 | DRG: 445 | Disposition: A | Discharge status: Home / Self Care | Payer: BLUE CROSS/BLUE SHIELD | Source: Ambulatory Visit | Attending: Family Medicine | Admitting: Family Medicine

## 2018-06-22 ENCOUNTER — Telehealth: Payer: Self-pay | Admitting: *Deleted

## 2018-06-22 ENCOUNTER — Other Ambulatory Visit: Payer: Self-pay | Admitting: *Deleted

## 2018-06-22 VITALS — BP 131/70 | HR 63 | Temp 98.3°F | Resp 17 | Ht 75.75 in | Wt 236.3 lb

## 2018-06-22 DIAGNOSIS — R945 Abnormal results of liver function studies: Secondary | ICD-10-CM

## 2018-06-22 DIAGNOSIS — K315 Obstruction of duodenum: Secondary | ICD-10-CM | POA: Diagnosis present

## 2018-06-22 DIAGNOSIS — C250 Malignant neoplasm of head of pancreas: Secondary | ICD-10-CM

## 2018-06-22 DIAGNOSIS — Z9689 Presence of other specified functional implants: Secondary | ICD-10-CM | POA: Diagnosis not present

## 2018-06-22 DIAGNOSIS — I10 Essential (primary) hypertension: Secondary | ICD-10-CM

## 2018-06-22 DIAGNOSIS — Z79899 Other long term (current) drug therapy: Secondary | ICD-10-CM | POA: Diagnosis not present

## 2018-06-22 DIAGNOSIS — R911 Solitary pulmonary nodule: Secondary | ICD-10-CM | POA: Diagnosis not present

## 2018-06-22 DIAGNOSIS — Z85828 Personal history of other malignant neoplasm of skin: Secondary | ICD-10-CM

## 2018-06-22 DIAGNOSIS — T85590A Other mechanical complication of bile duct prosthesis, initial encounter: Secondary | ICD-10-CM | POA: Diagnosis not present

## 2018-06-22 DIAGNOSIS — E876 Hypokalemia: Secondary | ICD-10-CM | POA: Diagnosis not present

## 2018-06-22 DIAGNOSIS — R7989 Other specified abnormal findings of blood chemistry: Secondary | ICD-10-CM | POA: Diagnosis present

## 2018-06-22 DIAGNOSIS — Z9221 Personal history of antineoplastic chemotherapy: Secondary | ICD-10-CM

## 2018-06-22 DIAGNOSIS — R17 Unspecified jaundice: Secondary | ICD-10-CM | POA: Diagnosis not present

## 2018-06-22 DIAGNOSIS — H9190 Unspecified hearing loss, unspecified ear: Secondary | ICD-10-CM | POA: Diagnosis not present

## 2018-06-22 DIAGNOSIS — Z4659 Encounter for fitting and adjustment of other gastrointestinal appliance and device: Secondary | ICD-10-CM | POA: Diagnosis not present

## 2018-06-22 DIAGNOSIS — K8030 Calculus of bile duct with cholangitis, unspecified, without obstruction: Secondary | ICD-10-CM | POA: Diagnosis not present

## 2018-06-22 DIAGNOSIS — D630 Anemia in neoplastic disease: Secondary | ICD-10-CM | POA: Diagnosis not present

## 2018-06-22 DIAGNOSIS — C801 Malignant (primary) neoplasm, unspecified: Secondary | ICD-10-CM | POA: Diagnosis not present

## 2018-06-22 DIAGNOSIS — K8309 Other cholangitis: Secondary | ICD-10-CM | POA: Diagnosis not present

## 2018-06-22 DIAGNOSIS — D61818 Other pancytopenia: Secondary | ICD-10-CM | POA: Diagnosis not present

## 2018-06-22 DIAGNOSIS — K831 Obstruction of bile duct: Secondary | ICD-10-CM

## 2018-06-22 DIAGNOSIS — C259 Malignant neoplasm of pancreas, unspecified: Secondary | ICD-10-CM | POA: Diagnosis not present

## 2018-06-22 DIAGNOSIS — R509 Fever, unspecified: Secondary | ICD-10-CM

## 2018-06-22 DIAGNOSIS — R079 Chest pain, unspecified: Secondary | ICD-10-CM | POA: Diagnosis not present

## 2018-06-22 DIAGNOSIS — K8050 Calculus of bile duct without cholangitis or cholecystitis without obstruction: Secondary | ICD-10-CM | POA: Diagnosis not present

## 2018-06-22 DIAGNOSIS — C787 Secondary malignant neoplasm of liver and intrahepatic bile duct: Secondary | ICD-10-CM

## 2018-06-22 LAB — CBC WITH DIFFERENTIAL (CANCER CENTER ONLY)
Abs Immature Granulocytes: 0.02 10*3/uL (ref 0.00–0.07)
Basophils Absolute: 0 10*3/uL (ref 0.0–0.1)
Basophils Relative: 0 %
Eosinophils Absolute: 0.1 10*3/uL (ref 0.0–0.5)
Eosinophils Relative: 1 %
HCT: 33 % — ABNORMAL LOW (ref 39.0–52.0)
HEMOGLOBIN: 11.9 g/dL — AB (ref 13.0–17.0)
Immature Granulocytes: 0 %
Lymphocytes Relative: 5 %
Lymphs Abs: 0.4 10*3/uL — ABNORMAL LOW (ref 0.7–4.0)
MCH: 30.4 pg (ref 26.0–34.0)
MCHC: 36.1 g/dL — ABNORMAL HIGH (ref 30.0–36.0)
MCV: 84.2 fL (ref 80.0–100.0)
Monocytes Absolute: 0.6 10*3/uL (ref 0.1–1.0)
Monocytes Relative: 10 %
Neutro Abs: 5.3 10*3/uL (ref 1.7–7.7)
Neutrophils Relative %: 84 %
Platelet Count: 85 10*3/uL — ABNORMAL LOW (ref 150–400)
RBC: 3.92 MIL/uL — ABNORMAL LOW (ref 4.22–5.81)
RDW: 13.4 % (ref 11.5–15.5)
WBC Count: 6.4 10*3/uL (ref 4.0–10.5)
nRBC: 0 % (ref 0.0–0.2)

## 2018-06-22 LAB — CMP (CANCER CENTER ONLY)
ALT: 342 U/L (ref 0–44)
AST: 125 U/L — ABNORMAL HIGH (ref 15–41)
Albumin: 3.1 g/dL — ABNORMAL LOW (ref 3.5–5.0)
Alkaline Phosphatase: 234 U/L — ABNORMAL HIGH (ref 38–126)
Anion gap: 8 (ref 5–15)
BILIRUBIN TOTAL: 7.7 mg/dL — AB (ref 0.3–1.2)
BUN: 12 mg/dL (ref 8–23)
CO2: 24 mmol/L (ref 22–32)
Calcium: 8.7 mg/dL — ABNORMAL LOW (ref 8.9–10.3)
Chloride: 105 mmol/L (ref 98–111)
Creatinine: 0.84 mg/dL (ref 0.61–1.24)
GFR, Est AFR Am: >60 mL/min (ref >60)
GFR, Estimated: >60 mL/min (ref >60)
Glucose, Bld: 94 mg/dL (ref 70–99)
POTASSIUM: 3.4 mmol/L — AB (ref 3.5–5.1)
Sodium: 137 mmol/L (ref 135–145)
Total Protein: 6 g/dL — ABNORMAL LOW (ref 6.5–8.1)

## 2018-06-22 MED ORDER — OXYCODONE HCL 5 MG PO TABS
5.0000 mg | ORAL_TABLET | ORAL | Status: DC | PRN
  Administered 2018-06-23: 5 mg via ORAL
  Filled 2018-06-22: qty 1

## 2018-06-22 MED ORDER — HEPARIN SOD (PORK) LOCK FLUSH 100 UNIT/ML IV SOLN
500.0000 [IU] | Freq: Once | INTRAVENOUS | Status: DC
  Filled 2018-06-22: qty 5

## 2018-06-22 MED ORDER — SODIUM CHLORIDE (PF) 0.9 % IJ SOLN
INTRAMUSCULAR | Status: AC
  Filled 2018-06-22: qty 50

## 2018-06-22 MED ORDER — KETOROLAC TROMETHAMINE 15 MG/ML IJ SOLN: 15.0000 mg | Freq: Four times a day (QID) | INTRAMUSCULAR | Status: DC | PRN

## 2018-06-22 MED ORDER — PIPERACILLIN-TAZOBACTAM 3.375 G IVPB 30 MIN
3.3750 g | Freq: Once | INTRAVENOUS | Status: AC
  Administered 2018-06-22: 3.375 g via INTRAVENOUS
  Filled 2018-06-22: qty 50

## 2018-06-22 MED ORDER — SODIUM CHLORIDE 0.9% FLUSH
10.0000 mL | INTRAVENOUS | Status: DC | PRN
  Filled 2018-06-22: qty 10

## 2018-06-22 MED ORDER — SENNOSIDES-DOCUSATE SODIUM 8.6-50 MG PO TABS: 1.0000 | ORAL_TABLET | Freq: Every evening | ORAL | Status: DC | PRN

## 2018-06-22 MED ORDER — IOHEXOL 300 MG/ML  SOLN
100.0000 mL | Freq: Once | INTRAMUSCULAR | Status: AC | PRN
  Administered 2018-06-22: 100 mL via INTRAVENOUS

## 2018-06-22 MED ORDER — ONDANSETRON HCL 4 MG/2ML IJ SOLN: 4.0000 mg | Freq: Four times a day (QID) | INTRAMUSCULAR | Status: DC | PRN

## 2018-06-22 MED ORDER — SODIUM CHLORIDE 0.9 % IV SOLN
INTRAVENOUS | Status: DC
  Administered 2018-06-22 – 2018-06-23 (×2): via INTRAVENOUS

## 2018-06-22 MED ORDER — HYDRALAZINE HCL 20 MG/ML IJ SOLN: 5.0000 mg | Freq: Four times a day (QID) | INTRAMUSCULAR | Status: DC | PRN

## 2018-06-22 MED ORDER — GABAPENTIN 100 MG PO CAPS
200.0000 mg | ORAL_CAPSULE | Freq: Two times a day (BID) | ORAL | Status: DC
  Administered 2018-06-22 – 2018-06-25 (×6): 200 mg via ORAL
  Filled 2018-06-22 (×6): qty 2

## 2018-06-22 MED ORDER — IOPAMIDOL (ISOVUE-300) INJECTION 61%: 100.0000 mL | Freq: Once | INTRAVENOUS | Status: DC | PRN

## 2018-06-22 MED ORDER — AMLODIPINE BESYLATE 10 MG PO TABS
10.0000 mg | ORAL_TABLET | Freq: Every day | ORAL | Status: DC
  Administered 2018-06-22 – 2018-06-24 (×3): 10 mg via ORAL
  Filled 2018-06-22 (×3): qty 1

## 2018-06-22 MED ORDER — ONDANSETRON HCL 4 MG PO TABS: 4.0000 mg | ORAL_TABLET | Freq: Four times a day (QID) | ORAL | Status: DC | PRN

## 2018-06-22 MED ORDER — PIPERACILLIN-TAZOBACTAM 3.375 G IVPB
3.3750 g | Freq: Three times a day (TID) | INTRAVENOUS | Status: DC
  Administered 2018-06-23 – 2018-06-25 (×8): 3.375 g via INTRAVENOUS
  Filled 2018-06-22 (×9): qty 50

## 2018-06-22 NOTE — Progress Notes (Signed)
Pharmacy Antibiotic Note  Cory Kelly is a 62 y.o. male admitted on 06/22/2018 with intra-abdominal infection.  Pharmacy has been consulted for piperacillin/tazobactam dosing.  -WBC WNL -Afebrile -Blood cultures being drawn now per RN  Plan:  Piperacillin/tazobactam 3.375 g IV q8h EI  Follow renal function, culture data  Height: 6\' 3"  (190.5 cm) Weight: 235 lb 14.3 oz (107 kg) IBW/kg (Calculated) : 84.5  Temp (24hrs), Avg:98 F (36.7 C), Min:97.6 F (36.4 C), Max:98.3 F (36.8 C)  Recent Labs  Lab 06/22/18 1305  WBC 6.4  CREATININE 0.84    Estimated Creatinine Clearance: 122.1 mL/min (by C-G formula based on SCr of 0.84 mg/dL).    No Known Allergies  Antimicrobials this admission: Piperacillin/tazobactam 12/20 >>   Dose adjustments this admission:  Microbiology results: 12/20 BCx: Sent 12/20 UCx: Sent   Thank you for allowing pharmacy to be a part of this patient's care.  Lenis Noon, PharmD Clinical Pharmacist 06/22/2018 5:50 PM
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
## 2018-06-22 NOTE — Telephone Encounter (Signed)
Wife had left Mychart message 12/19 regarding dark urine and yellow-looking eyes. Called and requested return call to discuss this further. Also had received a message on 12/19 from [facility] Call center that his port was bumped and is painful.

## 2018-06-22 NOTE — H&P (Signed)
History and Physical    [name] XTK:[id number] DOB: 06-Jul-1955 DOA: 06/22/2018  PCP: Mancel Bale, PA-C   Patient coming from: Home  I have personally briefly reviewed patient's old medical records in [place]  Chief Complaint: Chills and weakness  HPI: Cory Kelly is a 62 y.o. male with medical history significant of metastatic pancreatic adenocarcinoma status post chemotherapy and indwelling biliary stent and being planned for resumption of chemotherapy after holidays by Dr. Learta Codding, hypertension presented today to Dr. Carin Hock office with complains of chills and weakness for the last few days. Patient developed chills during basketball game earlier this week. He has been having chills for the last two nights with weakness and not feeling well. No documented fever. He also has intermittent nausea along with some abdominal discomfort but no abdominal pain. He has noticed dark urine for the last few days and his wife noticed yellowing of his eyes today.  Patient had blood work done at Dr. Carin Hock office today which showed elevated bilirubin and LFTs.  Dr. Learta Codding spoke to Dr. Jacobs/gastroenterology who recommended admission for antibiotics and possible ERCP.   Review of Systems: As per HPI otherwise rest of the 10 point systems were reviewed and are negative.   Past Medical History:  Diagnosis Date  . Abdominal pain    due to bloating  . Bloating   . Cancer ([place])    skin cancer  . Essential hypertension   . Fatigue   . History of weight loss   . HOH (hard of hearing)    no hearing aids  . Neuralgia   . Stress    loss of father in Dec 2018.    Past Surgical History:  Procedure Laterality Date  . APPENDECTOMY    . ENDOSCOPIC RETROGRADE CHOLANGIOPANCREATOGRAPHY (ERCP) WITH PROPOFOL N/A 08/10/2017   Procedure: ENDOSCOPIC RETROGRADE CHOLANGIOPANCREATOGRAPHY (ERCP) WITH PROPOFOL;  Surgeon: Milus Banister, MD;  Location: [facility];  Service: Endoscopy;   Laterality: N/A;  . EUS N/A 08/10/2017   Procedure: UPPER ENDOSCOPIC ULTRASOUND (EUS) RADIAL;  Surgeon: Milus Banister, MD;  Location: [facility];  Service: Endoscopy;  Laterality: N/A;  . EYE SURGERY     lasik/left eye  . IR FLUORO GUIDE PORT INSERTION RIGHT  08/30/2017  . IR US GUIDE VASC ACCESS RIGHT  08/30/2017  . SKIN CANCER EXCISION     Social history  reports that he has never smoked. He has never used smokeless tobacco. He reports that he does not drink alcohol or use drugs.  No Known Allergies  Family History  Problem Relation Age of Onset  . Hyperlipidemia Father   . Heart disease Father   . Dementia Father   . Memory loss Mother     Prior to Admission medications   Medication Sig Start Date End Date Taking? Authorizing Provider  acetaminophen (TYLENOL) 500 MG tablet Take 500-1,000 mg by mouth every 6 (six) hours as needed for moderate pain.   Yes [provider]  amLODipine (NORVASC) 10 MG tablet Take 5-10 mg by mouth 2 (two) times daily. Take 1 tablet (10 mg) in the morning and Take 1/2 tablet (5 mg) in the evening. 05/21/18  Yes [provider]  gabapentin (NEURONTIN) 100 MG capsule Take 100 mg in am and 200 mg at bedtime Patient taking differently: Take 200 mg by mouth 2 (two) times daily.  05/03/18  Yes Ladell Pier, MD  cyclobenzaprine (FLEXERIL) 5 MG tablet Take 1 tablet (5 mg total) by mouth every  12 (twelve) hours as needed for muscle spasms. Patient not taking: Reported on 06/22/2018 10/05/17   Ladell Pier, MD  fluticasone [facility]) 50 MCG/ACT nasal spray USE 2 SPRAYS IN NOSTRIL DAILY Patient not taking: Reported on 06/22/2018 01/26/18   Mancel Bale, PA-C  ibuprofen (ADVIL,MOTRIN) 600 MG tablet Take 1 tablet (600 mg total) by mouth every 6 (six) hours as needed. Patient not taking: Reported on 06/22/2018 12/07/17   Fatima Blank, MD  lidocaine-prilocaine (EMLA) cream Apply 1 application topically as needed. Apply to portacath  site 1-2 hours prior to use 01/03/18   Ladell Pier, MD  olmesartan (BENICAR) 40 MG tablet Take 1 tablet (40 mg total) by mouth daily. 04/04/18   Gale Journey, Damaris Hippo, PA-C  prochlorperazine (COMPAZINE) 10 MG tablet Take 1 tablet (10 mg total) by mouth every 6 (six) hours as needed for nausea or vomiting. 08/21/17   Owens Shark, NP  tamsulosin (FLOMAX) 0.4 MG CAPS capsule Take 1 capsule (0.4 mg total) by mouth daily. Patient not taking: Reported on 05/25/2018 11/21/17   Ladell Pier, MD  traMADol (ULTRAM) 50 MG tablet Take 1 tablet (50 mg total) by mouth every 8 (eight) hours as needed. Patient not taking: Reported on 05/25/2018 01/18/18   Ladell Pier, MD  triamcinolone cream (KENALOG) 0.5 % Apply 1 application topically 2 (two) times daily. 12/07/17   Mancel Bale, PA-C    Physical Exam: Vitals:   06/22/18 1724  BP: (!) 153/95  Pulse: (!) 56  Resp: 16  Temp: 97.6 F (36.4 C)  TempSrc: Oral  SpO2: 100%    Constitutional: NAD, calm, comfortable Vitals:   06/22/18 1724  BP: (!) 153/95  Pulse: (!) 56  Resp: 16  Temp: 97.6 F (36.4 C)  TempSrc: Oral  SpO2: 100%   Eyes: PERRL, icterus present  ENMT: Mucous membranes are slightly dry. Posterior pharynx clear of any exudate or lesions. Neck: normal, supple, no masses, no thyromegaly Respiratory: bilateral decreased breath sounds at bases, no wheezing, no crackles. Normal respiratory effort. No accessory muscle use.  Cardiovascular: S1 S2 positive, rate controlled. No extremity edema. 2+ pedal pulses.  Abdomen: no tenderness, no masses palpated. No hepatosplenomegaly. Bowel sounds positive.  Musculoskeletal: no clubbing / cyanosis. No joint deformity upper and lower extremities.  Skin: no rashes, lesions, ulcers. No induration Neurologic: CN 2-12 grossly intact. Moving extremities. No focal neurologic deficits.  Psychiatric: Normal judgment and insight. Alert and oriented x 3. Normal mood.   Labs on Admission: I have  personally reviewed following labs and imaging studies  CBC: Recent Labs  Lab 06/22/18 1305  WBC 6.4  NEUTROABS 5.3  HGB 11.9*  HCT 33.0*  MCV 84.2  PLT 85*   Basic Metabolic Panel: Recent Labs  Lab 06/22/18 1305  NA 137  K 3.4*  CL 105  CO2 24  GLUCOSE 94  BUN 12  CREATININE 0.84  CALCIUM 8.7*   GFR: Estimated Creatinine Clearance: 123.6 mL/min (by C-G formula based on SCr of 0.84 mg/dL). Liver Function Tests: Recent Labs  Lab 06/22/18 1305  AST 125*  ALT 342*  ALKPHOS 234*  BILITOT 7.7*  PROT 6.0*  ALBUMIN 3.1*   No results for input(s): LIPASE, AMYLASE in the last 168 hours. No results for input(s): AMMONIA in the last 168 hours. Coagulation Profile: No results for input(s): INR, PROTIME in the last 168 hours. Cardiac Enzymes: No results for input(s): CKTOTAL, CKMB, CKMBINDEX, TROPONINI in the last 168 hours. BNP (last  3 results) No results for input(s): PROBNP in the last 8760 hours. HbA1C: No results for input(s): HGBA1C in the last 72 hours. CBG: No results for input(s): GLUCAP in the last 168 hours. Lipid Profile: No results for input(s): CHOL, HDL, LDLCALC, TRIG, CHOLHDL, LDLDIRECT in the last 72 hours. Thyroid Function Tests: No results for input(s): TSH, T4TOTAL, FREET4, T3FREE, THYROIDAB in the last 72 hours. Anemia Panel: No results for input(s): VITAMINB12, FOLATE, FERRITIN, TIBC, IRON, RETICCTPCT in the last 72 hours. Urine analysis:    Component Value Date/Time   COLORURINE YELLOW 01/10/2018 0700   APPEARANCEUR CLEAR 01/10/2018 0700   LABSPEC 1.012 01/10/2018 0700   PHURINE 6.0 01/10/2018 0700   GLUCOSEU NEGATIVE 01/10/2018 0700   HGBUR SMALL (A) 01/10/2018 0700   BILIRUBINUR NEGATIVE 01/10/2018 0700   KETONESUR NEGATIVE 01/10/2018 0700   PROTEINUR NEGATIVE 01/10/2018 0700   UROBILINOGEN 0.2 02/10/2007 1109   NITRITE NEGATIVE 01/10/2018 0700   LEUKOCYTESUR NEGATIVE 01/10/2018 0700    Radiological Exams on Admission: No results  found.    Assessment/Plan Active Problems:   Essential hypertension   Elevated LFTs   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HCC)   Cholangitis   Probable cholangitis in a patient with metastatic pancreatic adenocarcinoma with indwelling biliary stent with elevated bilirubin and LFTs -There is a concern for biliary stent obstruction/malfunction.  We will get stat CT scan of the abdomen and pelvis. -Blood cultures.  Urinalysis/urine cultures.  Zosyn to be started after blood cultures are drawn.   -Dr. Learta Codding had notified Dr. Jacob/gastroenterology who recommended admission for antibiotics and possible ERCP. -I have paged the on-call gastroenterologist/Dr. Tarri Glenn -N.p.o. past midnight -Repeat a.m. LFTs  Hypertension -monitor blood pressure.  Resume home regimen in a.m.  Thrombocytopenia -Probably chronic from pancreatic cancer.  No signs of bleeding.  Monitor  Anemia  -Most likely from anemia of chronic disease from cancer.  Monitor    DVT prophylaxis: SCDs.  Avoid Lovenox because of thrombocytopenia Code Status: Full Family Communication: None at bedside Disposition Plan: Home in 2 to 4 days pending clinical improvement and GI evaluation Consults called: GI.  Spoke to Dr. Learta Codding on phone Admission status: MedSurg/inpatient  Severity of Illness: The appropriate patient status for this patient is INPATIENT. Inpatient status is judged to be reasonable and necessary in order to provide the required intensity of service to ensure the patient's safety. The patient's presenting symptoms, physical exam findings, and initial radiographic and laboratory data in the context of their chronic comorbidities is felt to place them at high risk for further clinical deterioration. Furthermore, it is not anticipated that the patient will be medically stable for discharge from the hospital within 2 midnights of admission. The following factors support the patient status of inpatient.   " The  patient's presenting symptoms include chills/weakness/jaundice " The worrisome physical exam findings include jaundice. " The initial radiographic and laboratory data are worrisome because of elevated LFTs. " The chronic co-morbidities include metastatic pancreatic cancer with indwelling biliary stent  * I certify that at the point of admission it is my clinical judgment that the patient will require inpatient hospital care spanning beyond 2 midnights from the point of admission due to high intensity of service, high risk for further deterioration and high frequency of surveillance required.Aline August MD [facility] Pager [phone]  If 7PM-7AM, please contact night-coverage [email or web address] Password [email or web address]  06/22/2018, 5:30 PM

## 2018-06-22 NOTE — Progress Notes (Signed)
[facility] OFFICE PROGRESS NOTE   Diagnosis: Pancreas cancer  INTERVAL HISTORY:   Cory Kelly returns prior to his scheduled visit.  He developed chills during a basketball game earlier this week.  He reports having chills each of the last 2 nights and not feeling well.  No documented fever.  No dysuria. He left for the beach yesterday, but returned today after not feeling well.  He noted the onset of dark urine earlier this week.  Objective:  Vital signs in last 24 hours:  Blood pressure 131/70, pulse 63, temperature 98.3 F (36.8 C), temperature source Oral, resp. rate 17, height 6' 3.75" (1.924 m), weight 236 lb 4.8 oz (107.2 kg), SpO2 100 %.    HEENT: Scleral icterus, no thrush Resp: Lungs clear bilaterally Cardio: Regular rate and rhythm GI: No hepatomegaly, nontender Vascular: No leg edema  Skin: Jaundice  Portacath/PICC-without erythema  Lab Results:  Lab Results  Component Value Date   WBC 6.4 06/22/2018   HGB 11.9 (L) 06/22/2018   HCT 33.0 (L) 06/22/2018   MCV 84.2 06/22/2018   PLT 85 (L) 06/22/2018   NEUTROABS 5.3 06/22/2018    CMP  Lab Results  Component Value Date   NA 137 06/22/2018   K 3.4 (L) 06/22/2018   CL 105 06/22/2018   CO2 24 06/22/2018   GLUCOSE 94 06/22/2018   BUN 12 06/22/2018   CREATININE 0.84 06/22/2018   CALCIUM 8.7 (L) 06/22/2018   PROT 6.0 (L) 06/22/2018   ALBUMIN 3.1 (L) 06/22/2018   AST 125 (H) 06/22/2018   ALT 342 (HH) 06/22/2018   ALKPHOS 234 (H) 06/22/2018   BILITOT 7.7 (HH) 06/22/2018   GFRNONAA >60 06/22/2018   GFRAA >60 06/22/2018     Medications: I have reviewed the patient's current medications.   Assessment/Plan: 1.  Pancreas head mass  CT abdomen/pelvis 08/03/2017-fullness in the head of the pancreas with stranding in the peripancreatic fat and pancreatic ductal dilatation.   MRI abdomen 08/04/2017-findings favored to represent acute on chronic pancreatitis; equivocal soft tissue fullness  within the head and uncinate process of the pancreas; indeterminate too small to characterize right hepatic lobe lesion; small volume abdominal ascites; splenomegaly.   CA-19-9 elevated at 977on 08/04/2017.  Status postupper EUS 08/10/2017-findings of an irregular masslike region in the pancreatic head measuring approximately 2.7 cm. This directly abutted the main portal vein but no other major vascular structures. Biopsies obtained with preliminary cytology positive for malignancy, likely adenocarcinoma. The final report is pending. The common bile duct and main pancreatic duct were both dilated. ERCP was then proceeded with forstent placement. Multiple attempts were made tocannulate the bile duct without success. The procedure was aborted.  08/16/2017 ERCP with placement of a metal biliary stent in the common bile duct by Dr. Francella Solian at [facility].  Cycle 1 FOLFIRINOX 08/31/2017  Cycle 2 FOLFIRINOX 09/14/2017  Cycle 3 FOLFIRINOX 09/28/2017  Cycle 4 FOLFIRINOX 10/12/2017 (oxaliplatin dose reduced secondary to thrombocytopenia)  Cycle 5 FOLFIRINOX 10/26/2017  Cycle 6 FOLFIRINOX 11/09/2017  [facility] CT[facility] at [facility] 11/29/2017-no definitive evidence of distant metastatic disease. 2 subcentimeter liver lesions described on prior MRI not visualized on CT. Ill-defined pancreatic head mass stable to decreased in size measuring 2.1 x 2 cm. Peripancreatic inflammatory stranding decreased from prior. No biliary ductal dilatation. Celiac axis less than 180 degrees abutment. Common hepatic artery with greater than 180 degrees encasement; superior mesenteric artery with short segment less than 180 degrees abutment; portal vein/superior mesenteric vein with greater than 180  degrees encasement of the extrahepatic portal vein with associated circumferential narrowing at the portomesenteric, overall substantially improved from prior examination. Now less than 180 degree abutment of the SMV. The portal vein and  SMV remain patent. Splenic vein patent.  Cycle 1 gemcitabine/Abraxane 12/06/2017  Cycle 2 gemcitabine/Abraxane 12/20/2017  Cycle 3 gemcitabine/Abraxane 01/03/2018  Cycle 4 gemcitabine/Abraxane 01/17/2018  Cycle 5 gemcitabine 01/31/2018 (Abraxane held due to neuropathy)  Cycle 6 gemcitabine 02/14/2018 (Abraxane held due to neuropathy)  Cycle 7 gemcitabine 02/28/2018 (Abraxane held due to neuropathy)  CT chest/abdomen/pelvis at [facility] 03/08/2018- stable appearance of previously identified infiltrating pancreatic head mass. No CT evidence of metastatic disease.  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 Ill-defined pancreatic head mass not felt to be significantly changed.  CT chest/abdomen/pelvis 05/10/2018- ill-defined hypoattenuating pancreatic head mass and ill-defined soft tissue abutting the celiac axis with mildly increased dilatation of the main pancreatic duct. New soft tissue nodule along the right upper lobe bronchus; hypoattenuating liver lesions concerning for metastasis.  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 Hypertension 3. Port-A-Cath placement 08/30/2017 4. History of elevated bilirubin-questionGilbert's syndrome 5. Mild leukopenia, thrombocytopenia-question secondary to portal hypertension/splenomegaly;09/14/2017 white count improved, platelet count  stable;progressive thrombocytopenia following gemcitabine/Abraxane 6. Kidney stones 7. Fever following cycles 2 and 3 gemcitabine/Abraxane,? Fever related to gemcitabine 8.   Jaundice, elevated liver enzymes and bilirubin 06/22/2018   Disposition: Cory Kelly has metastatic pancreas cancer.  He is currently followed with observation.  The plan is to consider resuming systemic chemotherapy after the holidays.  He presents today with new onset jaundice.  The liver enzymes and bilirubin are elevated.  He has an indwelling biliary stent.  A stent has been in place since February of this year.  I suspect biliary stent occlusion.  I discussed the case with Dr. Ardis Hughs.  He recommends hospital admission for intravenous antibiotics and evaluation by gastroenterology.  I will contact the hospitalist service to request admission.  Please contact oncology as needed over the weekend.  I will see him 06/25/2018.  Betsy Coder, MD  06/22/2018  3:38 PM

## 2018-06-23 DIAGNOSIS — R945 Abnormal results of liver function studies: Secondary | ICD-10-CM

## 2018-06-23 DIAGNOSIS — K8309 Other cholangitis: Secondary | ICD-10-CM

## 2018-06-23 DIAGNOSIS — I10 Essential (primary) hypertension: Secondary | ICD-10-CM

## 2018-06-23 LAB — CBC WITH DIFFERENTIAL/PLATELET
Abs Immature Granulocytes: 0.02 10*3/uL (ref 0.00–0.07)
Basophils Absolute: 0 10*3/uL (ref 0.0–0.1)
Basophils Relative: 1 %
Eosinophils Absolute: 0.1 10*3/uL (ref 0.0–0.5)
Eosinophils Relative: 2 %
HCT: 32.3 % — ABNORMAL LOW (ref 39.0–52.0)
Hemoglobin: 11.1 g/dL — ABNORMAL LOW (ref 13.0–17.0)
IMMATURE GRANULOCYTES: 1 %
Lymphocytes Relative: 7 %
Lymphs Abs: 0.3 10*3/uL — ABNORMAL LOW (ref 0.7–4.0)
MCH: 29.5 pg (ref 26.0–34.0)
MCHC: 34.4 g/dL (ref 30.0–36.0)
MCV: 85.9 fL (ref 80.0–100.0)
Monocytes Absolute: 0.4 10*3/uL (ref 0.1–1.0)
Monocytes Relative: 9 %
Neutro Abs: 3.3 10*3/uL (ref 1.7–7.7)
Neutrophils Relative %: 80 %
Platelets: 78 10*3/uL — ABNORMAL LOW (ref 150–400)
RBC: 3.76 MIL/uL — ABNORMAL LOW (ref 4.22–5.81)
RDW: 13.2 % (ref 11.5–15.5)
WBC: 4.2 10*3/uL (ref 4.0–10.5)
nRBC: 0 % (ref 0.0–0.2)

## 2018-06-23 LAB — URINALYSIS, ROUTINE W REFLEX MICROSCOPIC
Bacteria, UA: NONE SEEN
Bilirubin Urine: NEGATIVE
Glucose, UA: 50 mg/dL — AB
Ketones, ur: 20 mg/dL — AB
Leukocytes, UA: NEGATIVE
Nitrite: NEGATIVE
Protein, ur: NEGATIVE mg/dL
Specific Gravity, Urine: 1.019 (ref 1.005–1.030)
pH: 6 (ref 5.0–8.0)

## 2018-06-23 LAB — COMPREHENSIVE METABOLIC PANEL
ALT: 231 U/L — ABNORMAL HIGH (ref 0–44)
AST: 68 U/L — ABNORMAL HIGH (ref 15–41)
Albumin: 3.1 g/dL — ABNORMAL LOW (ref 3.5–5.0)
Alkaline Phosphatase: 175 U/L — ABNORMAL HIGH (ref 38–126)
Anion gap: 9 (ref 5–15)
BUN: 11 mg/dL (ref 8–23)
CO2: 24 mmol/L (ref 22–32)
Calcium: 8.3 mg/dL — ABNORMAL LOW (ref 8.9–10.3)
Chloride: 108 mmol/L (ref 98–111)
Creatinine, Ser: 0.84 mg/dL (ref 0.61–1.24)
GFR calc Af Amer: >60 mL/min (ref >60)
GFR calc non Af Amer: >60 mL/min (ref >60)
Glucose, Bld: 100 mg/dL — ABNORMAL HIGH (ref 70–99)
Potassium: 3.3 mmol/L — ABNORMAL LOW (ref 3.5–5.1)
SODIUM: 141 mmol/L (ref 135–145)
Total Bilirubin: 5.4 mg/dL — ABNORMAL HIGH (ref 0.3–1.2)
Total Protein: 5.5 g/dL — ABNORMAL LOW (ref 6.5–8.1)

## 2018-06-23 LAB — MAGNESIUM: Magnesium: 2 mg/dL (ref 1.7–2.4)

## 2018-06-23 MED ORDER — SENNOSIDES-DOCUSATE SODIUM 8.6-50 MG PO TABS
2.0000 | ORAL_TABLET | Freq: Every day | ORAL | Status: DC
  Filled 2018-06-23: qty 2

## 2018-06-23 MED ORDER — HYDRALAZINE HCL 20 MG/ML IJ SOLN: 10.0000 mg | Freq: Four times a day (QID) | INTRAMUSCULAR | Status: DC | PRN

## 2018-06-23 MED ORDER — POTASSIUM CHLORIDE CRYS ER 20 MEQ PO TBCR
40.0000 meq | EXTENDED_RELEASE_TABLET | Freq: Once | ORAL | Status: AC
  Administered 2018-06-23: 40 meq via ORAL
  Filled 2018-06-23: qty 2

## 2018-06-23 NOTE — Progress Notes (Signed)
Patient Demographics:    Cory Kelly, is a 62 y.o. male, DOB - 1956-04-02, OIZ:[id number]  Admit date - 06/22/2018   Admitting Physician Aline August, MD  Outpatient Primary MD for the patient is Weber, Damaris Hippo, PA-C  LOS - 1   No chief complaint on file.       Subjective:    Cory Kelly today has no fevers, No chest pain, wife, daughter and granddaughter at bedside, nausea but no emesis, no further chills or rigors  Assessment  & Plan :    Active Problems:   Essential hypertension   Elevated LFTs   Primary cancer of head of pancreas [facility])   Cholangitis  Brief Summary  62 y.o. male with pancreatic cancer dxed 08/2017, s/p prior ERCP with placement of a covered metal stent on 08/16/2017 by Dr. Francella Solian  at [facility], status post prior chemotherapy, and s/p SBRT to the pancreas in October, In November CA-19-9 significantly elevated at 2138, and subsequent MRI showed a 6 mm lesion in the right lobe of the liver and a new 8 mm lesion just superior to the gallbladder, admitted 06/22/2018 as a direct admission from Dr. Odis Hollingshead office with chills abdominal pain and concerns about cholangitis and possible progression of his pancreatic malignancy  Plan:- 1)Possible Cholangitis--- presenting with chills and abdominal pain, patient has metastatic pancreatic adenocarcinoma with indwelling covered mental biliary stent now has elevated LFTs and bilirubin , overall LFTs are starting to trend down,  continue IV Zosyn pending cultures,   2)Pancreatic Cancer--??  Progression, GI and oncology consult appreciated,  patient is for ERCP on 06/25/2018 by Dr. Rush Landmark possible stent placement, please keep patient n.p.o. from midnight of 06/24/2018  3)Anemia and thrombocytopenia--- due to underlying pancreatic malignancy, monitor closely and transfuse as clinically indicated  4)HTN-stable, continue amlodipine 10 mg daily, IV  hydralazine as needed elevated BP  Disposition/Need for in-Hospital Stay- patient unable to be discharged at this time due to concerns about cholangitis and biliary obstruction requiring IV Zosyn and need for ERCP with biliary stent placement  Code Status : Full code  Disposition Plan  : TDB  Consults  :  Gi/Oncology   DVT Prophylaxis  :    - SCDs   Lab Results  Component Value Date   PLT 78 (L) 06/23/2018    Inpatient Medications  Scheduled Meds: . amLODipine  10 mg Oral Daily  . gabapentin  200 mg Oral BID   Continuous Infusions: . sodium chloride 100 mL/hr at 06/23/18 0611  . piperacillin-tazobactam (ZOSYN)  IV 3.375 g (06/23/18 1456)   PRN Meds:.hydrALAZINE, iopamidol, ketorolac, ondansetron **OR** ondansetron (ZOFRAN) IV, oxyCODONE, senna-docusate    Anti-infectives (From admission, onward)   Start     Dose/Rate Route Frequency Ordered Stop   06/23/18 0600  piperacillin-tazobactam (ZOSYN) IVPB 3.375 g     3.375 g 12.5 mL/hr over 240 Minutes Intravenous Every 8 hours 06/22/18 1747     06/22/18 1830  piperacillin-tazobactam (ZOSYN) IVPB 3.375 g     3.375 g 100 mL/hr over 30 Minutes Intravenous  Once 06/22/18 1747 06/22/18 1855        Objective:   Vitals:   06/22/18 1724 06/22/18 2043 06/23/18 0608 06/23/18 1430  BP: (!) 153/95 (!) 143/84 134/88 [name] Kitchen)  150/82  Pulse: (!) 56 63 72 64  Resp: 16 14 16 18   Temp: 97.6 F (36.4 C) 97.8 F (36.6 C) (!) 97.5 F (36.4 C) 98.7 F (37.1 C)  TempSrc: Oral Oral Oral Oral  SpO2: 100% 100% 99% 96%  Weight: 107 kg     Height: 6\' 3"  (1.905 m)       Wt Readings from Last 3 Encounters:  06/22/18 107 kg  06/22/18 107.2 kg  06/08/18 104.5 kg     Intake/Output Summary (Last 24 hours) at 06/23/2018 1934 Last data filed at 06/23/2018 1900 Gross per 24 hour  Intake 1991.67 ml  Output -  Net 1991.67 ml     Physical Exam Patient is examined daily including today on 06/23/18 , exams remain the same as of yesterday  except that has changed   Gen:- Awake Alert,  In no apparent distress  HEENT:- [place].AT, +ve sclera icterus Neck-Supple Neck,No JVD,.  Lungs-  CTAB , fair symmetrical air movement= CV- S1, S2 normal, regular  Abd-  +ve B.Sounds, Abd Soft, periumbilical area tenderness, without rebound or guarding Extremity/Skin:- No  edema, pedal pulses present  Psych-affect is appropriate, oriented x3 Neuro-no new focal deficits, no tremors   Data Review:   Micro Results Recent Results (from the past 240 hour(s))  Culture, blood (routine x 2)     Status: None (Preliminary result)   Collection Time: 06/22/18  5:53 PM  Result Value Ref Range Status   Specimen Description   Final    BLOOD RIGHT HAND Performed at [facility] [address] [place], [place] [address]    Special Requests   Final    BOTTLES DRAWN AEROBIC AND ANAEROBIC Blood Culture adequate volume Performed at [facility] [address] [place], [place] [address]    Culture   Final    NO GROWTH < 12 HOURS Performed at [facility] [address] [place], [place] [address]    Report Status PENDING  Incomplete  Culture, blood (routine x 2)     Status: None (Preliminary result)   Collection Time: 06/22/18  5:59 PM  Result Value Ref Range Status   Specimen Description   Final    BLOOD LEFT HAND Performed at [facility] [address], [place], [place] [address]    Special Requests   Final    BOTTLES DRAWN AEROBIC AND ANAEROBIC Blood Culture adequate volume Performed at [facility] [address] [place], [place] [address]    Culture   Final    NO GROWTH < 12 HOURS Performed at [facility] [address] [place], [place] [address]    Report Status PENDING  Incomplete    Radiology Reports Ct Abdomen Pelvis W Contrast  Result Date: 06/22/2018 CLINICAL DATA:  Jaundice, abdominal pain, and fever. Metastatic pancreatic  adenocarcinoma, indwelling biliary stent. EXAM: CT ABDOMEN AND PELVIS WITH CONTRAST TECHNIQUE: Multidetector CT imaging of the abdomen and pelvis was performed using the standard protocol following bolus administration of intravenous contrast. CONTRAST:  190mL OMNIPAQUE IOHEXOL 300 MG/ML  SOLN COMPARISON:  Multiple exams, including 05/09/2018 FINDINGS: Lower chest: Three-vessel coronary artery atherosclerotic calcification is present. Linear subsegmental atelectasis anteriorly in the right lower lobe. Hepatobiliary: Increased conspicuity of a lesion along the dome of segment 8 laterally, 1.6 by 1.4 cm on image 16/2. This is increased from prior exams. A hypoenhancing liver lesion measuring 1.4 by 1.5 cm in segment 5 of the  liver adjacent to the gallbladder is present on image 32/2. This measured 0.8 cm in diameter on 05/09/2017. Pneumobilia is present. Currently no significant biliary dilatation. Biliary stent remains in place and appears to traverse the pancreatic head mass and extend into the duodenum. There is mild wall thickening in the gallbladder, especially along the neck of the gallbladder, significance uncertain. A 1.1 cm hypodense lesion above the gallbladder 1 on image 26/2 could represent a subtle metastatic lesion. Pancreas: Highly indistinctly marginated hypoenhancing tumor in the head of the pancreas traversed by the stent. I measure this roughly at about 1.9 by 3.9 cm but some of transversely the size of the tumor is difficult to estimate as a could extend around the stent although some of the findings around the stent may simply be inflammatory. Frontal back the pancreatic tumor is thought to be about 2.1 cm on image 37/2, roughly stable from 05/09/2018 outside exam [facility]. There is abnormal soft tissue compatible with tumor tracking on around at least 50% of the inferior margin of the portal vein and causing narrowing of the portal vein on image 45/4. This tumor extends to the  confluence of the splenic and superior mesenteric veins, and mildly contacts the superior mesenteric vein. There is also abnormal soft tissue thickening extending from this region along the right margin of the celiac trunk, and contacting about 148 degrees of the surface of the celiac trunk on image 47/4. This is a roughly similar amount of tumor contact compared to 05/09/2018 exam. Spleen: Mild stable splenomegaly. Adrenals/Urinary Tract: Suspected small right adrenal adenoma. 9 mm right kidney lower pole nonobstructive renal calculus. Right kidney lower pole cyst. 3 mm left kidney upper pole nonobstructive renal calculus. Urinary bladder unremarkable. Stomach/Bowel: Indistinct tissue planes around the descending duodenum possibly from some degree of local inflammation. Vascular/Lymphatic: Aortoiliac atherosclerotic vascular disease. Vascular contact related to the pancreatic mass is noted in the pancreas section above. Reproductive: Mild prostatomegaly indenting the bladder base. Other: No supplemental non-categorized findings. Musculoskeletal: Lower thoracic and lumbar spondylosis. IMPRESSION: 1. Pneumobilia with mild intrahepatic biliary dilatation mostly segment 7 of the liver, with the common bile duct above the level of the stent only measuring about 7 mm in diameter. The stent traverses the pancreatic mass and extends into the duodenum. 2. Increase in size in 2 lesions suspected of being metastatic lesions. One of these is in segment 5 of the liver adjacent the gallbladder and measures 1.5 by 1.4 cm, previously 0.8 cm on the outside [facility] study of 05/09/2018. The other dominant lesion is in the dome of the right hepatic lobe, and measures 1.6 by 1.4 cm, previously significantly smaller and poorly seen on the more recent study. There also some other vague hypodensities which could possibly be metastatic. 3. The pancreatic mass itself appears similar, spanning about 2.1 cm anterior-posterior, and  with soft tissue component causing some narrowing of the portal vein, abutting the confluence of the SMV and splenic vein, and also with a soft tissue component extending around an estimated 148 degrees of the surface of the celiac trunk. 4. Indistinct tissue planes around the duodenum, along with wall thickening in the gallbladder and especially the gallbladder neck. Possibility of cholecystitis or duodenal inflammation is raised. Localized groove pancreatitis superimposed on the underlying pancreatic mass would be difficult to exclude. 5. Stable mild splenomegaly. 6. Bilateral nonobstructive nephrolithiasis. 7. Mild prostatomegaly. 8.  Aortic Atherosclerosis (ICD10-I70.0).  Coronary atherosclerosis. Electronically Signed   By: Cindra Eves.D.  On: 06/22/2018 21:02   Portable Chest 1 View  Result Date: 06/22/2018 CLINICAL DATA:  Chest pain EXAM: PORTABLE CHEST 1 VIEW COMPARISON:  01/10/2018 FINDINGS: Right-sided central venous port tip over the SVC. No acute consolidation or effusion. Normal heart size. No pneumothorax. IMPRESSION: No active disease. Electronically Signed   By: Donavan Foil M.D.   On: 06/22/2018 19:09     CBC Recent Labs  Lab 06/22/18 1305 06/23/18 0608  WBC 6.4 4.2  HGB 11.9* 11.1*  HCT 33.0* 32.3*  PLT 85* 78*  MCV 84.2 85.9  MCH 30.4 29.5  MCHC 36.1* 34.4  RDW 13.4 13.2  LYMPHSABS 0.4* 0.3*  MONOABS 0.6 0.4  EOSABS 0.1 0.1  BASOSABS 0.0 0.0    Chemistries  Recent Labs  Lab 06/22/18 1305 06/23/18 0608  NA 137 141  K 3.4* 3.3*  CL 105 108  CO2 24 24  GLUCOSE 94 100*  BUN 12 11  CREATININE 0.84 0.84  CALCIUM 8.7* 8.3*  MG  --  2.0  AST 125* 68*  ALT 342* 231*  ALKPHOS 234* 175*  BILITOT 7.7* 5.4*   ------------------------------------------------------------------------------------------------------------------ No results for input(s): CHOL, HDL, LDLCALC, TRIG, CHOLHDL, LDLDIRECT in the last 72 hours.  Lab Results  Component Value Date    HGBA1C 5.0 12/07/2017    Roxan Hockey M.D on 06/23/2018 at 7:34 PM  Pager---[phone] Go to [email or web address] - password [email or web address] for contact info  [facility] - Office  [phone]

## 2018-06-23 NOTE — Consult Note (Signed)
Consultation  Referring Provider:  Dr Benay Spice / Oncology Primary Care Physician:  Mancel Bale, PA-C Primary Gastroenterologist:  Dr.Jacobs  Reason for Consultation:  Pancreatic cancer , jaundice  HPI: Cory Kelly is a 62 y.o. male, known to Dr. Ardis Hughs, who was unfortunately diagnosed with pancreatic cancer in February 2019.[name] Kitchen  At imaging in February 2019 he was noted to have a dilated common bile duct and main pancreatic duct.  ERCP was attempted by Dr. Ardis Hughs for stent placement, but unable to cannulate.  Patient was then referred to Dr. Francella Solian  at [facility], and underwent ERCP with placement of a covered metal stent on 08/16/2017. Patient subsequently underwent chemotherapy, and then had restaging CTs at [facility] in May.  At that time with no definite evidence of distant metastatic disease.  He had another course of chemotherapy through August 2019, and was to be evaluated for potential surgical resection after he had SBRT to the pancreas in October. In November CA-19-9 significantly elevated at 2138, and subsequent MRI showed a 6 mm lesion in the right lobe of the liver and a new 8 mm lesion just superior to the gallbladder.  On CT scan also noted ill-defined soft tissue abutting the celiac axis and new soft tissue nodule along the right upper lobe bronchus concerning for metastases..  Liver biopsy confirmed adenocarcinoma consistent with metastatic disease from pancreas.  Patient was in process of being set up for another course of chemotherapy after the holidays. He was at the beach earlier this week when he had development of dark urine, malaise, and had intermittent chills over the past couple of days without fever or Rigor. His wife says he felt poorly in general yesterday.  He was evaluated by Dr. Benay Spice, and labs revealed elevated LFTs with bilirubin of 7, alk phos 234, ALT of 342, AST of 125.  WBC 6.4..  Decision was made that patient should be admitted to the hospital over the weekend  for IV antibiotics rather than be managed as an outpatient,, and to be scheduled for ERCP with probable additional stent placement..  CT scan last night showed the 2 previous liver lesions slightly increased in size, no ductal dilation and metal stent is in place.  There is mild wall thickening of the gallbladder.  The pancreatic head tumor appears stable in size.  There is soft tissue component causing narrowing of the portal vein and abutting the SMV and splenic vein and soft tissue component extending around the celiac trunk.  There is indistinct tissue planes around the duodenum.  Patient has been afebrile since admission last night, WBC remains normal.  He has no current complaints of abdominal pain and is hungry.   Past Medical History:  Diagnosis Date  . Abdominal pain    due to bloating  . Bloating   . Cancer ([place])    skin cancer  . Essential hypertension   . Fatigue   . History of weight loss   . HOH (hard of hearing)    no hearing aids  . Neuralgia   . Stress    loss of father in Dec 2018.    Past Surgical History:  Procedure Laterality Date  . APPENDECTOMY    . ENDOSCOPIC RETROGRADE CHOLANGIOPANCREATOGRAPHY (ERCP) WITH PROPOFOL N/A 08/10/2017   Procedure: ENDOSCOPIC RETROGRADE CHOLANGIOPANCREATOGRAPHY (ERCP) WITH PROPOFOL;  Surgeon: Milus Banister, MD;  Location: [facility] ENDOSCOPY;  Service: Endoscopy;  Laterality: N/A;  . EUS N/A 08/10/2017   Procedure: UPPER ENDOSCOPIC ULTRASOUND (EUS) RADIAL;  Surgeon:  Milus Banister, MD;  Location: [facility];  Service: Endoscopy;  Laterality: N/A;  . EYE SURGERY     lasik/left eye  . IR FLUORO GUIDE PORT INSERTION RIGHT  08/30/2017  . IR US GUIDE VASC ACCESS RIGHT  08/30/2017  . SKIN CANCER EXCISION      Prior to Admission medications   Medication Sig Start Date End Date Taking? Authorizing Provider  acetaminophen (TYLENOL) 500 MG tablet Take 500-1,000 mg by mouth every 6 (six) hours as needed for moderate pain.   Yes [provider]  amLODipine (NORVASC) 10 MG tablet Take 10 mg by mouth every evening.  05/21/18  Yes [provider]  gabapentin (NEURONTIN) 100 MG capsule Take 100 mg in am and 200 mg at bedtime Patient taking differently: Take 100-200 mg by mouth 2 (two) times daily. Take 1 capsule (100 mg) in the morning and Take 2 capsules (200 mg) in the evening 05/03/18  Yes Ladell Pier, MD  olmesartan (BENICAR) 40 MG tablet Take 1 tablet (40 mg total) by mouth daily. 04/04/18  Yes Weber, Sarah L, PA-C  lidocaine-prilocaine (EMLA) cream Apply 1 application topically as needed. Apply to portacath site 1-2 hours prior to use 01/03/18   Ladell Pier, MD  prochlorperazine (COMPAZINE) 10 MG tablet Take 1 tablet (10 mg total) by mouth every 6 (six) hours as needed for nausea or vomiting. 08/21/17   Owens Shark, NP    Current Facility-Administered Medications  Medication Dose Route Frequency Provider Last Rate Last Dose  . 0.9 %  sodium chloride infusion   Intravenous Continuous Aline August, MD 100 mL/hr at 06/23/18 0611    . amLODipine (NORVASC) tablet 10 mg  10 mg Oral Daily Aline August, MD   10 mg at 06/22/18 2052  . gabapentin (NEURONTIN) capsule 200 mg  200 mg Oral BID Aline August, MD   200 mg at 06/22/18 2051  . hydrALAZINE (APRESOLINE) injection 5 mg  5 mg Intravenous Q6H PRN Alekh, Kshitiz, MD      . iopamidol (ISOVUE-300) 61 % injection 100 mL  100 mL Intravenous Once PRN Alekh, Kshitiz, MD      . ketorolac (TORADOL) 15 MG/ML injection 15 mg  15 mg Intravenous Q6H PRN Alekh, Kshitiz, MD      . ondansetron (ZOFRAN) tablet 4 mg  4 mg Oral Q6H PRN Starla Link, Kshitiz, MD       Or  . ondansetron (ZOFRAN) injection 4 mg  4 mg Intravenous Q6H PRN Alekh, Kshitiz, MD      . oxyCODONE (Oxy IR/ROXICODONE) immediate release tablet 5 mg  5 mg Oral Q4H PRN Alekh, Kshitiz, MD      . piperacillin-tazobactam (ZOSYN) IVPB 3.375 g  3.375 g Intravenous Q8H Lenis Noon, RPH 12.5 mL/hr at 06/23/18 0613  3.375 g at 06/23/18 7564  . senna-docusate (Senokot-S) tablet 1 tablet  1 tablet Oral QHS PRN Aline August, MD        Allergies as of 06/22/2018  . (No Known Allergies)    Family History  Problem Relation Age of Onset  . Hyperlipidemia Father   . Heart disease Father   . Dementia Father   . Memory loss Mother     Social History   Socioeconomic History  . Marital status: Married    Spouse name: Not on file  . Number of children: Not on file  . Years of education: Not on file  . Highest education level: Not on file  Occupational History  Employer: [employer]  Social Needs  . Financial resource strain: Not on file  . Food insecurity:    Worry: Not on file    Inability: Not on file  . Transportation needs:    Medical: Not on file    Non-medical: Not on file  Tobacco Use  . Smoking status: Never Smoker  . Smokeless tobacco: Never Used  Substance and Sexual Activity  . Alcohol use: No  . Drug use: No  . Sexual activity: Yes    Birth control/protection: None  Lifestyle  . Physical activity:    Days per week: Not on file    Minutes per session: Not on file  . Stress: Not on file  Relationships  . Social connections:    Talks on phone: Not on file    Gets together: Not on file    Attends religious service: Not on file    Active member of club or organization: Not on file    Attends meetings of clubs or organizations: Not on file    Relationship status: Not on file  . Intimate partner violence:    Fear of current or ex partner: Not on file    Emotionally abused: Not on file    Physically abused: Not on file    Forced sexual activity: Not on file  Other Topics Concern  . Not on file  Social History Narrative   Married   2 children, 2 grand children   Coaches womens basketball at [employer]: Pertinent positive and negative review of systems were noted in the above HPI section.  All other review of systems was otherwise  negative.  Physical Exam: Vital signs in last 24 hours: Temp:  [97.5 F (36.4 C)-98.3 F (36.8 C)] 97.5 F (36.4 C) (12/21 0626) Pulse Rate:  [56-72] 72 (12/21 0608) Resp:  [14-17] 16 (12/21 0608) BP: (131-153)/(70-95) 134/88 (12/21 0608) SpO2:  [99 %-100 %] 99 % (12/21 9485) Weight:  [107 kg-107.2 kg] 107 kg (12/20 1724) Last BM Date: (PTA) General:   Alert,  Well-developed, well-nourished, pleasant and cooperative in NAD, jaundiced.  Family at bedside Head:  Normocephalic and atraumatic. Eyes:  Sclera icteric   conjunctiva pink. Ears:  Normal auditory acuity. Nose:  No deformity, discharge,  or lesions. Mouth:  No deformity or lesions.   Neck:  Supple; no masses or thyromegaly. Lungs:  Clear throughout to auscultation.   No wheezes, crackles, or rhonchi. Heart:  Regular rate and rhythm; no murmurs, clicks, rubs,  or gallops. Abdomen:  Soft,nontender, BS active,nonpalp mass or hsm.   Rectal:  Deferred  Msk:  Symmetrical without gross deformities. . Pulses:  Normal pulses noted. Extremities:  Without clubbing or edema. Neurologic:  Alert and  oriented x4;  grossly normal neurologically. Skin: Jaundiced. Psych:  Alert and cooperative. Normal mood and affect.  Intake/Output from previous day: 12/20 0701 - 12/21 0700 In: 1031.7 [I.V.:1031.7] Out: -  Intake/Output this shift: No intake/output data recorded.  Lab Results: Recent Labs    06/22/18 1305 06/23/18 0608  WBC 6.4 4.2  HGB 11.9* 11.1*  HCT 33.0* 32.3*  PLT 85* 78*   BMET Recent Labs    06/22/18 1305 06/23/18 0608  NA 137 141  K 3.4* 3.3*  CL 105 108  CO2 24 24  GLUCOSE 94 100*  BUN 12 11  CREATININE 0.84 0.84  CALCIUM 8.7* 8.3*   LFT Recent Labs    06/23/18 0608  PROT 5.5*  ALBUMIN  3.1*  AST 68*  ALT 231*  ALKPHOS 175*  BILITOT 5.4*       IMPRESSION:  #15 62 year old white male diagnosed with pancreatic adenocarcinoma February 2019, at time of diagnosis he did have bile duct dilation  and underwent stent placement at [facility] per Dr. Francella Solian in February 2019. He has undergone 2 courses of chemotherapy, and had SBRT to the pancreas October 2019. He was being considered for surgical resection when CA-19-9 noted to be markedly elevated in November and then subsequent imaging with MRI and CT positive for 2 metastatic lesions in the liver, and new involvement of pancreatic head tumor with soft tissue density abutting the celiac axis.  Plan was for patient to start another round of chemotherapy in January.  He developed jaundice this week, intermittent chills and progressive malaise.  Repeat imaging last night with CT shows no ductal dilation and stent in good position.  He has had some slight increase in the size of the metastatic lesions in the liver, there is new indistinct tissue planes around the duodenum and soft tissue density involving adjacent vascular structures.  Question tumor invasion of the duodenum,, and suspect he has either external compression of the CBD by tumor or subtle tumor involvement of the common bile duct.  Clinically he does not have overt cholangitis   Plan; regular diet Continue IV Zosyn Plan is for ERCP and probable additional stent placement per Dr. Rush Landmark on Monday, 06/25/2018.  Procedure scheduled for early afternoon. Procedure was discussed in detail with the patient and family including indications risks and benefits and they are agreeable to proceed.  If he has changes in his status over the weekend, and/or development of overt cholangitis, we will involve weekend biliary coverage/Dr. Paulita Fujita for ERCP.  Thank you will follow with you    Amy Esterwood PA-C 06/23/2018, 9:57 AM

## 2018-06-24 LAB — CBC
HEMATOCRIT: 32.6 % — AB (ref 39.0–52.0)
Hemoglobin: 11.4 g/dL — ABNORMAL LOW (ref 13.0–17.0)
MCH: 29.7 pg (ref 26.0–34.0)
MCHC: 35 g/dL (ref 30.0–36.0)
MCV: 84.9 fL (ref 80.0–100.0)
Platelets: 85 10*3/uL — ABNORMAL LOW (ref 150–400)
RBC: 3.84 MIL/uL — ABNORMAL LOW (ref 4.22–5.81)
RDW: 13.1 % (ref 11.5–15.5)
WBC: 3.4 10*3/uL — ABNORMAL LOW (ref 4.0–10.5)
nRBC: 0 % (ref 0.0–0.2)

## 2018-06-24 LAB — COMPREHENSIVE METABOLIC PANEL
ALT: 168 U/L — ABNORMAL HIGH (ref 0–44)
AST: 40 U/L (ref 15–41)
Albumin: 3.2 g/dL — ABNORMAL LOW (ref 3.5–5.0)
Alkaline Phosphatase: 199 U/L — ABNORMAL HIGH (ref 38–126)
Anion gap: 8 (ref 5–15)
BUN: 9 mg/dL (ref 8–23)
CO2: 27 mmol/L (ref 22–32)
Calcium: 8.5 mg/dL — ABNORMAL LOW (ref 8.9–10.3)
Chloride: 105 mmol/L (ref 98–111)
Creatinine, Ser: 0.84 mg/dL (ref 0.61–1.24)
GFR calc Af Amer: >60 mL/min (ref >60)
GFR calc non Af Amer: >60 mL/min (ref >60)
Glucose, Bld: 121 mg/dL — ABNORMAL HIGH (ref 70–99)
Potassium: 3.4 mmol/L — ABNORMAL LOW (ref 3.5–5.1)
Sodium: 140 mmol/L (ref 135–145)
Total Bilirubin: 4 mg/dL — ABNORMAL HIGH (ref 0.3–1.2)
Total Protein: 5.6 g/dL — ABNORMAL LOW (ref 6.5–8.1)

## 2018-06-24 LAB — URINE CULTURE: CULTURE: NO GROWTH

## 2018-06-24 LAB — PROTIME-INR
INR: 0.9
Prothrombin Time: 12.1 seconds (ref 11.4–15.2)

## 2018-06-24 MED ORDER — ALPRAZOLAM 0.25 MG PO TABS
0.2500 mg | ORAL_TABLET | Freq: Once | ORAL | Status: AC
  Administered 2018-06-24: 0.25 mg via ORAL
  Filled 2018-06-24: qty 1

## 2018-06-24 MED ORDER — POTASSIUM CHLORIDE CRYS ER 20 MEQ PO TBCR
40.0000 meq | EXTENDED_RELEASE_TABLET | Freq: Once | ORAL | Status: AC
  Administered 2018-06-24: 40 meq via ORAL
  Filled 2018-06-24: qty 2

## 2018-06-24 NOTE — Progress Notes (Signed)
Patient ID: Cory Kelly, male   DOB: 07-22-1955, 62 y.o.   MRN: [id number]    Progress Note   Subjective    Feeling better in general  today - malaise improved , no nausea or pain. No fever or chills  Tbili down to 4 WBC 3.4   Objective   Vital signs in last 24 hours: Temp:  [97.4 F (36.3 C)-99.2 F (37.3 C)] 97.4 F (36.3 C) (12/22 0530) Pulse Rate:  [52-64] 52 (12/22 0530) Resp:  [14-18] 18 (12/22 0530) BP: (120-150)/(69-89) 120/69 (12/22 0530) SpO2:  [95 %-96 %] 96 % (12/22 0530) Last BM Date: (PTA) General:    white male  in NAD, jaundiced Heart:  Regular rate and rhythm; no murmurs Lungs: Respirations even and unlabored, lungs CTA bilaterally Abdomen:  Soft, nontender and nondistended. Normal bowel sounds. Extremities:  Without edema. Neurologic:  Alert and oriented,  grossly normal neurologically. Psych:  Cooperative. Normal mood and affect.  Intake/Output from previous day: 12/21 0701 - 12/22 0700 In: 960 [P.O.:960] Out: -  Intake/Output this shift: No intake/output data recorded.  Lab Results: Recent Labs    06/22/18 1305 06/23/18 0608 06/24/18 0529  WBC 6.4 4.2 3.4*  HGB 11.9* 11.1* 11.4*  HCT 33.0* 32.3* 32.6*  PLT 85* 78* 85*   BMET Recent Labs    06/22/18 1305 06/23/18 0608 06/24/18 0529  NA 137 141 140  K 3.4* 3.3* 3.4*  CL 105 108 105  CO2 24 24 27   GLUCOSE 94 100* 121*  BUN 12 11 9   CREATININE 0.84 0.84 0.84  CALCIUM 8.7* 8.3* 8.5*   LFT Recent Labs    06/24/18 0529  PROT 5.6*  ALBUMIN 3.2*  AST 40  ALT 168*  ALKPHOS 199*  BILITOT 4.0*   PT/INR Recent Labs    06/24/18 0529  LABPROT 12.1  INR 0.90    Studies/Results: Ct Abdomen Pelvis W Contrast  Result Date: 06/22/2018 CLINICAL DATA:  Jaundice, abdominal pain, and fever. Metastatic pancreatic adenocarcinoma, indwelling biliary stent. EXAM: CT ABDOMEN AND PELVIS WITH CONTRAST TECHNIQUE: Multidetector CT imaging of the abdomen and pelvis was performed using the  standard protocol following bolus administration of intravenous contrast. CONTRAST:  173mL OMNIPAQUE IOHEXOL 300 MG/ML  SOLN COMPARISON:  Multiple exams, including 05/09/2018 FINDINGS: Lower chest: Three-vessel coronary artery atherosclerotic calcification is present. Linear subsegmental atelectasis anteriorly in the right lower lobe. Hepatobiliary: Increased conspicuity of a lesion along the dome of segment 8 laterally, 1.6 by 1.4 cm on image 16/2. This is increased from prior exams. A hypoenhancing liver lesion measuring 1.4 by 1.5 cm in segment 5 of the liver adjacent to the gallbladder is present on image 32/2. This measured 0.8 cm in diameter on 05/09/2017. Pneumobilia is present. Currently no significant biliary dilatation. Biliary stent remains in place and appears to traverse the pancreatic head mass and extend into the duodenum. There is mild wall thickening in the gallbladder, especially along the neck of the gallbladder, significance uncertain. A 1.1 cm hypodense lesion above the gallbladder 1 on image 26/2 could represent a subtle metastatic lesion. Pancreas: Highly indistinctly marginated hypoenhancing tumor in the head of the pancreas traversed by the stent. I measure this roughly at about 1.9 by 3.9 cm but some of transversely the size of the tumor is difficult to estimate as a could extend around the stent although some of the findings around the stent may simply be inflammatory. Frontal back the pancreatic tumor is thought to be about 2.1 cm on image 37/2,  roughly stable from 05/09/2018 outside exam from [facility]. There is abnormal soft tissue compatible with tumor tracking on around at least 50% of the inferior margin of the portal vein and causing narrowing of the portal vein on image 45/4. This tumor extends to the confluence of the splenic and superior mesenteric veins, and mildly contacts the superior mesenteric vein. There is also abnormal soft tissue thickening extending from this  region along the right margin of the celiac trunk, and contacting about 148 degrees of the surface of the celiac trunk on image 47/4. This is a roughly similar amount of tumor contact compared to 05/09/2018 exam. Spleen: Mild stable splenomegaly. Adrenals/Urinary Tract: Suspected small right adrenal adenoma. 9 mm right kidney lower pole nonobstructive renal calculus. Right kidney lower pole cyst. 3 mm left kidney upper pole nonobstructive renal calculus. Urinary bladder unremarkable. Stomach/Bowel: Indistinct tissue planes around the descending duodenum possibly from some degree of local inflammation. Vascular/Lymphatic: Aortoiliac atherosclerotic vascular disease. Vascular contact related to the pancreatic mass is noted in the pancreas section above. Reproductive: Mild prostatomegaly indenting the bladder base. Other: No supplemental non-categorized findings. Musculoskeletal: Lower thoracic and lumbar spondylosis. IMPRESSION: 1. Pneumobilia with mild intrahepatic biliary dilatation mostly segment 7 of the liver, with the common bile duct above the level of the stent only measuring about 7 mm in diameter. The stent traverses the pancreatic mass and extends into the duodenum. 2. Increase in size in 2 lesions suspected of being metastatic lesions. One of these is in segment 5 of the liver adjacent the gallbladder and measures 1.5 by 1.4 cm, previously 0.8 cm on the outside [facility] study of 05/09/2018. The other dominant lesion is in the dome of the right hepatic lobe, and measures 1.6 by 1.4 cm, previously significantly smaller and poorly seen on the more recent study. There also some other vague hypodensities which could possibly be metastatic. 3. The pancreatic mass itself appears similar, spanning about 2.1 cm anterior-posterior, and with soft tissue component causing some narrowing of the portal vein, abutting the confluence of the SMV and splenic vein, and also with a soft tissue component extending  around an estimated 148 degrees of the surface of the celiac trunk. 4. Indistinct tissue planes around the duodenum, along with wall thickening in the gallbladder and especially the gallbladder neck. Possibility of cholecystitis or duodenal inflammation is raised. Localized groove pancreatitis superimposed on the underlying pancreatic mass would be difficult to exclude. 5. Stable mild splenomegaly. 6. Bilateral nonobstructive nephrolithiasis. 7. Mild prostatomegaly. 8.  Aortic Atherosclerosis (ICD10-I70.0).  Coronary atherosclerosis. Electronically Signed   By: Van Clines M.D.   On: 06/22/2018 21:02   Portable Chest 1 View  Result Date: 06/22/2018 CLINICAL DATA:  Chest pain EXAM: PORTABLE CHEST 1 VIEW COMPARISON:  01/10/2018 FINDINGS: Right-sided central venous port tip over the SVC. No acute consolidation or effusion. Normal heart size. No pneumothorax. IMPRESSION: No active disease. Electronically Signed   By: Donavan Foil M.D.   On: 06/22/2018 19:09       Assessment / Plan:    #1 62 yo WM with metastatic pancreatic cancer - s/p ERCP and metal CBD stent 08/2017 . PT admitted 12/20 with onset of jaundice last week ,chills , malaise .  CT on admit  Showed no ductal dilation, stent in position, some slight  increase in size of liver mets, and new indistinct tissue planes around the duodenum, and adjacent vascular structures . Question tumor invasion of duodenum,ecternal compression of CBD by tumor or subtle  tumor involvement of CBD.  He has not had signs of overt Cholangitis- feels better on Zosyn, LFT:s improved  He has  Pancytopenia-  no chemo in months   Plan; Continue IV Zosyn NPO after MN Pt is scheduled for ERCP and probable addition stent placement with Dr Rush Landmark tomorrow at 1pm.  Repeat labs in am - need PLT count greater than 50,000 for procedure  Hope to get him discharged tomorrow evening  for Christmas          Active Problems:   Essential hypertension    Elevated LFTs   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[place])   Cholangitis     LOS: 2 days   Captola Teschner  06/24/2018, 9:29 AM

## 2018-06-24 NOTE — Progress Notes (Signed)
Patient Demographics:    Cory Kelly, is a 62 y.o. male, DOB - October 10, 1955, HUD:[id number]  Admit date - 06/22/2018   Admitting Physician Aline August, MD  Outpatient Primary MD for the patient is Weber, Damaris Hippo, PA-C  LOS - 2   No chief complaint on file.       Subjective:   Lying comfortably in bed-denies any chest pain or shortness of breath.   Assessment  & Plan :  Brief Summary  62 y.o. male with pancreatic cancer dxed 08/2017, s/p prior ERCP with placement of a covered metal stent on 08/16/2017 by Dr. Francella Solian  at [facility], status post prior chemotherapy, and s/p SBRT to the pancreas in October, In November CA-19-9 significantly elevated at 2138, and subsequent MRI showed a 6 mm lesion in the right lobe of the liver and a new 8 mm lesion just superior to the gallbladder, admitted 06/22/2018 as a direct admission from Dr. Odis Hollingshead office with chills abdominal pain and concerns about cholangitis and possible progression of his pancreatic malignancy  Plan:- 1)Possible Cholangitis: Afebrile-no leukocytosis-on empiric Zosyn, GI following with plans for ERCP on 12/23.  Transaminases and total bilirubin slowly downtrending with just supportive care.    2) pancreatic adenocarcinoma: Given worsening LFTs/bilirubin-concern for progression with biliary tract infiltration/external compression-for ERCP and stent placement on 12/23.    3)Anemia and thrombocytopenia: Relatively stable-follow for now-likely secondary to underlying  malignancy.  4)HTN: Stable-continue amlodipine-stable  5) hypokalemia: Replete and recheck  Code Status : Full code  Disposition Plan  : TDB  Consults  :  Gi/Oncology   DVT Prophylaxis  :    - SCDs   Lab Results  Component Value Date   PLT 85 (L) 06/24/2018    Inpatient Medications  Scheduled Meds: . amLODipine  10 mg Oral Daily  . gabapentin  200 mg Oral BID  .  senna-docusate  2 tablet Oral QHS   Continuous Infusions: . sodium chloride 50 mL/hr (06/23/18 2116)  . piperacillin-tazobactam (ZOSYN)  IV 3.375 g (06/24/18 0531)   PRN Meds:.hydrALAZINE, iopamidol, ondansetron **OR** ondansetron (ZOFRAN) IV, oxyCODONE    Anti-infectives (From admission, onward)   Start     Dose/Rate Route Frequency Ordered Stop   06/23/18 0600  piperacillin-tazobactam (ZOSYN) IVPB 3.375 g     3.375 g 12.5 mL/hr over 240 Minutes Intravenous Every 8 hours 06/22/18 1747     06/22/18 1830  piperacillin-tazobactam (ZOSYN) IVPB 3.375 g     3.375 g 100 mL/hr over 30 Minutes Intravenous  Once 06/22/18 1747 06/22/18 1855        Objective:   Vitals:   06/23/18 0608 06/23/18 1430 06/23/18 2100 06/24/18 0530  BP: 134/88 (!) 150/82 (!) 145/89 120/69  Pulse: 72 64 63 (!) 52  Resp: 16 18 14 18   Temp: (!) 97.5 F (36.4 C) 98.7 F (37.1 C) 99.2 F (37.3 C) (!) 97.4 F (36.3 C)  TempSrc: Oral Oral Oral Oral  SpO2: 99% 96% 95% 96%  Weight:      Height:        Wt Readings from Last 3 Encounters:  06/22/18 107 kg  06/22/18 107.2 kg  06/08/18 104.5 kg     Intake/Output Summary (Last 24 hours) at 06/24/2018 1308 Last data filed  at 06/23/2018 1900 Gross per 24 hour  Intake 960 ml  Output -  Net 960 ml     Physical Exam General appearance:Awake, alert, not in any distress.  Eyes:no scleral icterus. HEENT: Atraumatic and Normocephalic Neck: supple, no JVD. Resp:Good air entry bilaterally,no rales or rhonchi CVS: S1 S2 regular, no murmurs.  GI: Bowel sounds present, Non tender and not distended with no gaurding, rigidity or rebound. Extremities: B/L Lower Ext shows no edema, both legs are warm to touch Neurology:  Non focal Psychiatric: Normal judgment and insight. Normal mood. Musculoskeletal:No digital cyanosis Skin:No Rash, warm and dry Wounds:N/A   Data Review:   Micro Results Recent Results (from the past 240 hour(s))  Urine culture     Status:  None   Collection Time: 06/22/18  5:27 PM  Result Value Ref Range Status   Specimen Description   Final    URINE, CLEAN CATCH Performed at [facility], [place] [address] [place], [place] [address]    Special Requests   Final    NONE Performed at [facility], [place] [address], [place], [place] [address]    Culture   Final    NO GROWTH Performed at [facility], [place] [address], [place], [place] [address]    Report Status 06/24/2018 FINAL  Final  Culture, blood (routine x 2)     Status: None (Preliminary result)   Collection Time: 06/22/18  5:53 PM  Result Value Ref Range Status   Specimen Description   Final    BLOOD RIGHT HAND Performed at [facility] [address], [place], [place] [address]    Special Requests   Final    BOTTLES DRAWN AEROBIC AND ANAEROBIC Blood Culture adequate volume Performed at [place] [address] [place], [place] [address]    Culture   Final    NO GROWTH 2 DAYS Performed at [facility] [place] [address], [place], [place] [address]    Report Status PENDING  Incomplete  Culture, blood (routine x 2)     Status: None (Preliminary result)   Collection Time: 06/22/18  5:59 PM  Result Value Ref Range Status   Specimen Description   Final    BLOOD LEFT HAND Performed at [facility] [address], [place], [place] [address]    Special Requests   Final    BOTTLES DRAWN AEROBIC AND ANAEROBIC Blood Culture adequate volume Performed at [facility] [address], [place], [place] [address]    Culture   Final    NO GROWTH 2 DAYS Performed at [facility] [address] [place], [place] [address]    Report Status PENDING  Incomplete    Radiology Reports Ct Abdomen Pelvis W Contrast  Result Date: 06/22/2018 CLINICAL DATA:  Jaundice, abdominal pain, and fever. Metastatic pancreatic adenocarcinoma, indwelling  biliary stent. EXAM: CT ABDOMEN AND PELVIS WITH CONTRAST TECHNIQUE: Multidetector CT imaging of the abdomen and pelvis was performed using the standard protocol following bolus administration of intravenous contrast. CONTRAST:  181mL OMNIPAQUE IOHEXOL 300 MG/ML  SOLN COMPARISON:  Multiple exams, including 05/09/2018 FINDINGS: Lower chest: Three-vessel coronary artery atherosclerotic calcification is present. Linear subsegmental atelectasis anteriorly in the right lower lobe. Hepatobiliary: Increased conspicuity of a lesion along the dome of segment 8 laterally, 1.6 by 1.4 cm on image 16/2. This is increased from prior exams. A hypoenhancing liver lesion measuring 1.4 by 1.5 cm in  segment 5 of the liver adjacent to the gallbladder is present on image 32/2. This measured 0.8 cm in diameter on 05/09/2017. Pneumobilia is present. Currently no significant biliary dilatation. Biliary stent remains in place and appears to traverse the pancreatic head mass and extend into the duodenum. There is mild wall thickening in the gallbladder, especially along the neck of the gallbladder, significance uncertain. A 1.1 cm hypodense lesion above the gallbladder 1 on image 26/2 could represent a subtle metastatic lesion. Pancreas: Highly indistinctly marginated hypoenhancing tumor in the head of the pancreas traversed by the stent. I measure this roughly at about 1.9 by 3.9 cm but some of transversely the size of the tumor is difficult to estimate as a could extend around the stent although some of the findings around the stent may simply be inflammatory. Frontal back the pancreatic tumor is thought to be about 2.1 cm on image 37/2, roughly stable from 05/09/2018 outside exam from [facility]. There is abnormal soft tissue compatible with tumor tracking on around at least 50% of the inferior margin of the portal vein and causing narrowing of the portal vein on image 45/4. This tumor extends to the confluence of the splenic and  superior mesenteric veins, and mildly contacts the superior mesenteric vein. There is also abnormal soft tissue thickening extending from this region along the right margin of the celiac trunk, and contacting about 148 degrees of the surface of the celiac trunk on image 47/4. This is a roughly similar amount of tumor contact compared to 05/09/2018 exam. Spleen: Mild stable splenomegaly. Adrenals/Urinary Tract: Suspected small right adrenal adenoma. 9 mm right kidney lower pole nonobstructive renal calculus. Right kidney lower pole cyst. 3 mm left kidney upper pole nonobstructive renal calculus. Urinary bladder unremarkable. Stomach/Bowel: Indistinct tissue planes around the descending duodenum possibly from some degree of local inflammation. Vascular/Lymphatic: Aortoiliac atherosclerotic vascular disease. Vascular contact related to the pancreatic mass is noted in the pancreas section above. Reproductive: Mild prostatomegaly indenting the bladder base. Other: No supplemental non-categorized findings. Musculoskeletal: Lower thoracic and lumbar spondylosis. IMPRESSION: 1. Pneumobilia with mild intrahepatic biliary dilatation mostly segment 7 of the liver, with the common bile duct above the level of the stent only measuring about 7 mm in diameter. The stent traverses the pancreatic mass and extends into the duodenum. 2. Increase in size in 2 lesions suspected of being metastatic lesions. One of these is in segment 5 of the liver adjacent the gallbladder and measures 1.5 by 1.4 cm, previously 0.8 cm on the outside [facility] study of 05/09/2018. The other dominant lesion is in the dome of the right hepatic lobe, and measures 1.6 by 1.4 cm, previously significantly smaller and poorly seen on the more recent study. There also some other vague hypodensities which could possibly be metastatic. 3. The pancreatic mass itself appears similar, spanning about 2.1 cm anterior-posterior, and with soft tissue component  causing some narrowing of the portal vein, abutting the confluence of the SMV and splenic vein, and also with a soft tissue component extending around an estimated 148 degrees of the surface of the celiac trunk. 4. Indistinct tissue planes around the duodenum, along with wall thickening in the gallbladder and especially the gallbladder neck. Possibility of cholecystitis or duodenal inflammation is raised. Localized groove pancreatitis superimposed on the underlying pancreatic mass would be difficult to exclude. 5. Stable mild splenomegaly. 6. Bilateral nonobstructive nephrolithiasis. 7. Mild prostatomegaly. 8.  Aortic Atherosclerosis (ICD10-I70.0).  Coronary atherosclerosis. Electronically Signed   By: Thayer Jew  Janeece Fitting M.D.   On: 06/22/2018 21:02   Portable Chest 1 View  Result Date: 06/22/2018 CLINICAL DATA:  Chest pain EXAM: PORTABLE CHEST 1 VIEW COMPARISON:  01/10/2018 FINDINGS: Right-sided central venous port tip over the SVC. No acute consolidation or effusion. Normal heart size. No pneumothorax. IMPRESSION: No active disease. Electronically Signed   By: Donavan Foil M.D.   On: 06/22/2018 19:09     CBC Recent Labs  Lab 06/22/18 1305 06/23/18 0608 06/24/18 0529  WBC 6.4 4.2 3.4*  HGB 11.9* 11.1* 11.4*  HCT 33.0* 32.3* 32.6*  PLT 85* 78* 85*  MCV 84.2 85.9 84.9  MCH 30.4 29.5 29.7  MCHC 36.1* 34.4 35.0  RDW 13.4 13.2 13.1  LYMPHSABS 0.4* 0.3*  --   MONOABS 0.6 0.4  --   EOSABS 0.1 0.1  --   BASOSABS 0.0 0.0  --     Chemistries  Recent Labs  Lab 06/22/18 1305 06/23/18 0608 06/24/18 0529  NA 137 141 140  K 3.4* 3.3* 3.4*  CL 105 108 105  CO2 24 24 27   GLUCOSE 94 100* 121*  BUN 12 11 9   CREATININE 0.84 0.84 0.84  CALCIUM 8.7* 8.3* 8.5*  MG  --  2.0  --   AST 125* 68* 40  ALT 342* 231* 168*  ALKPHOS 234* 175* 199*  BILITOT 7.7* 5.4* 4.0*   ------------------------------------------------------------------------------------------------------------------ No results  for input(s): CHOL, HDL, LDLCALC, TRIG, CHOLHDL, LDLDIRECT in the last 72 hours.  Lab Results  Component Value Date   HGBA1C 5.0 12/07/2017    Oren Binet M.D on 06/24/2018 at 1:08 PM  Go to [email or web address] - password [email or web address] for contact info  [facility] - Office  [phone]

## 2018-06-24 NOTE — H&P (View-Only) (Signed)
Patient ID: Cory Kelly, male   DOB: 1956-02-27, 62 y.o.   MRN: [id number]    Progress Note   Subjective    Feeling better in general  today - malaise improved , no nausea or pain. No fever or chills  Tbili down to 4 WBC 3.4   Objective   Vital signs in last 24 hours: Temp:  [97.4 F (36.3 C)-99.2 F (37.3 C)] 97.4 F (36.3 C) (12/22 0530) Pulse Rate:  [52-64] 52 (12/22 0530) Resp:  [14-18] 18 (12/22 0530) BP: (120-150)/(69-89) 120/69 (12/22 0530) SpO2:  [95 %-96 %] 96 % (12/22 0530) Last BM Date: (PTA) General:    white male  in NAD, jaundiced Heart:  Regular rate and rhythm; no murmurs Lungs: Respirations even and unlabored, lungs CTA bilaterally Abdomen:  Soft, nontender and nondistended. Normal bowel sounds. Extremities:  Without edema. Neurologic:  Alert and oriented,  grossly normal neurologically. Psych:  Cooperative. Normal mood and affect.  Intake/Output from previous day: 12/21 0701 - 12/22 0700 In: 960 [P.O.:960] Out: -  Intake/Output this shift: No intake/output data recorded.  Lab Results: Recent Labs    06/22/18 1305 06/23/18 0608 06/24/18 0529  WBC 6.4 4.2 3.4*  HGB 11.9* 11.1* 11.4*  HCT 33.0* 32.3* 32.6*  PLT 85* 78* 85*   BMET Recent Labs    06/22/18 1305 06/23/18 0608 06/24/18 0529  NA 137 141 140  K 3.4* 3.3* 3.4*  CL 105 108 105  CO2 24 24 27   GLUCOSE 94 100* 121*  BUN 12 11 9   CREATININE 0.84 0.84 0.84  CALCIUM 8.7* 8.3* 8.5*   LFT Recent Labs    06/24/18 0529  PROT 5.6*  ALBUMIN 3.2*  AST 40  ALT 168*  ALKPHOS 199*  BILITOT 4.0*   PT/INR Recent Labs    06/24/18 0529  LABPROT 12.1  INR 0.90    Studies/Results: Ct Abdomen Pelvis W Contrast  Result Date: 06/22/2018 CLINICAL DATA:  Jaundice, abdominal pain, and fever. Metastatic pancreatic adenocarcinoma, indwelling biliary stent. EXAM: CT ABDOMEN AND PELVIS WITH CONTRAST TECHNIQUE: Multidetector CT imaging of the abdomen and pelvis was performed using the  standard protocol following bolus administration of intravenous contrast. CONTRAST:  117mL OMNIPAQUE IOHEXOL 300 MG/ML  SOLN COMPARISON:  Multiple exams, including 05/09/2018 FINDINGS: Lower chest: Three-vessel coronary artery atherosclerotic calcification is present. Linear subsegmental atelectasis anteriorly in the right lower lobe. Hepatobiliary: Increased conspicuity of a lesion along the dome of segment 8 laterally, 1.6 by 1.4 cm on image 16/2. This is increased from prior exams. A hypoenhancing liver lesion measuring 1.4 by 1.5 cm in segment 5 of the liver adjacent to the gallbladder is present on image 32/2. This measured 0.8 cm in diameter on 05/09/2017. Pneumobilia is present. Currently no significant biliary dilatation. Biliary stent remains in place and appears to traverse the pancreatic head mass and extend into the duodenum. There is mild wall thickening in the gallbladder, especially along the neck of the gallbladder, significance uncertain. A 1.1 cm hypodense lesion above the gallbladder 1 on image 26/2 could represent a subtle metastatic lesion. Pancreas: Highly indistinctly marginated hypoenhancing tumor in the head of the pancreas traversed by the stent. I measure this roughly at about 1.9 by 3.9 cm but some of transversely the size of the tumor is difficult to estimate as a could extend around the stent although some of the findings around the stent may simply be inflammatory. Frontal back the pancreatic tumor is thought to be about 2.1 cm on image 37/2,  roughly stable from 05/09/2018 outside exam from [facility]. There is abnormal soft tissue compatible with tumor tracking on around at least 50% of the inferior margin of the portal vein and causing narrowing of the portal vein on image 45/4. This tumor extends to the confluence of the splenic and superior mesenteric veins, and mildly contacts the superior mesenteric vein. There is also abnormal soft tissue thickening extending from this  region along the right margin of the celiac trunk, and contacting about 148 degrees of the surface of the celiac trunk on image 47/4. This is a roughly similar amount of tumor contact compared to 05/09/2018 exam. Spleen: Mild stable splenomegaly. Adrenals/Urinary Tract: Suspected small right adrenal adenoma. 9 mm right kidney lower pole nonobstructive renal calculus. Right kidney lower pole cyst. 3 mm left kidney upper pole nonobstructive renal calculus. Urinary bladder unremarkable. Stomach/Bowel: Indistinct tissue planes around the descending duodenum possibly from some degree of local inflammation. Vascular/Lymphatic: Aortoiliac atherosclerotic vascular disease. Vascular contact related to the pancreatic mass is noted in the pancreas section above. Reproductive: Mild prostatomegaly indenting the bladder base. Other: No supplemental non-categorized findings. Musculoskeletal: Lower thoracic and lumbar spondylosis. IMPRESSION: 1. Pneumobilia with mild intrahepatic biliary dilatation mostly segment 7 of the liver, with the common bile duct above the level of the stent only measuring about 7 mm in diameter. The stent traverses the pancreatic mass and extends into the duodenum. 2. Increase in size in 2 lesions suspected of being metastatic lesions. One of these is in segment 5 of the liver adjacent the gallbladder and measures 1.5 by 1.4 cm, previously 0.8 cm on the outside [facility] study of 05/09/2018. The other dominant lesion is in the dome of the right hepatic lobe, and measures 1.6 by 1.4 cm, previously significantly smaller and poorly seen on the more recent study. There also some other vague hypodensities which could possibly be metastatic. 3. The pancreatic mass itself appears similar, spanning about 2.1 cm anterior-posterior, and with soft tissue component causing some narrowing of the portal vein, abutting the confluence of the SMV and splenic vein, and also with a soft tissue component extending  around an estimated 148 degrees of the surface of the celiac trunk. 4. Indistinct tissue planes around the duodenum, along with wall thickening in the gallbladder and especially the gallbladder neck. Possibility of cholecystitis or duodenal inflammation is raised. Localized groove pancreatitis superimposed on the underlying pancreatic mass would be difficult to exclude. 5. Stable mild splenomegaly. 6. Bilateral nonobstructive nephrolithiasis. 7. Mild prostatomegaly. 8.  Aortic Atherosclerosis (ICD10-I70.0).  Coronary atherosclerosis. Electronically Signed   By: Van Clines M.D.   On: 06/22/2018 21:02   Portable Chest 1 View  Result Date: 06/22/2018 CLINICAL DATA:  Chest pain EXAM: PORTABLE CHEST 1 VIEW COMPARISON:  01/10/2018 FINDINGS: Right-sided central venous port tip over the SVC. No acute consolidation or effusion. Normal heart size. No pneumothorax. IMPRESSION: No active disease. Electronically Signed   By: Donavan Foil M.D.   On: 06/22/2018 19:09       Assessment / Plan:    #1 62 yo WM with metastatic pancreatic cancer - s/p ERCP and metal CBD stent 08/2017 . PT admitted 12/20 with onset of jaundice last week ,chills , malaise .  CT on admit  Showed no ductal dilation, stent in position, some slight  increase in size of liver mets, and new indistinct tissue planes around the duodenum, and adjacent vascular structures . Question tumor invasion of duodenum,ecternal compression of CBD by tumor or subtle  tumor involvement of CBD.  He has not had signs of overt Cholangitis- feels better on Zosyn, LFT:s improved  He has  Pancytopenia-  no chemo in months   Plan; Continue IV Zosyn NPO after MN Pt is scheduled for ERCP and probable addition stent placement with Dr Rush Landmark tomorrow at 1pm.  Repeat labs in am - need PLT count greater than 50,000 for procedure  Hope to get him discharged tomorrow evening  for Christmas          Active Problems:   Essential hypertension    Elevated LFTs   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[place])   Cholangitis     LOS: 2 days   Montray Kliebert  06/24/2018, 9:29 AM

## 2018-06-25 ENCOUNTER — Encounter (HOSPITAL_COMMUNITY): Payer: Self-pay | Admitting: *Deleted

## 2018-06-25 ENCOUNTER — Inpatient Hospital Stay (HOSPITAL_COMMUNITY): Payer: BLUE CROSS/BLUE SHIELD | Admitting: Anesthesiology

## 2018-06-25 ENCOUNTER — Encounter (HOSPITAL_COMMUNITY)
Admission: AD | Disposition: A | Discharge status: Home / Self Care | Payer: Self-pay | Source: Ambulatory Visit | Attending: Family Medicine

## 2018-06-25 ENCOUNTER — Inpatient Hospital Stay (HOSPITAL_COMMUNITY): Payer: BLUE CROSS/BLUE SHIELD

## 2018-06-25 DIAGNOSIS — K315 Obstruction of duodenum: Secondary | ICD-10-CM

## 2018-06-25 DIAGNOSIS — K8050 Calculus of bile duct without cholangitis or cholecystitis without obstruction: Secondary | ICD-10-CM

## 2018-06-25 DIAGNOSIS — Z4659 Encounter for fitting and adjustment of other gastrointestinal appliance and device: Secondary | ICD-10-CM

## 2018-06-25 HISTORY — PX: ERCP: SHX5425

## 2018-06-25 HISTORY — PX: BILIARY STENT PLACEMENT: SHX5538

## 2018-06-25 HISTORY — PX: REMOVAL OF STONES: SHX5545

## 2018-06-25 LAB — COMPREHENSIVE METABOLIC PANEL
ALT: 157 U/L — ABNORMAL HIGH (ref 0–44)
AST: 62 U/L — ABNORMAL HIGH (ref 15–41)
Albumin: 3.3 g/dL — ABNORMAL LOW (ref 3.5–5.0)
Alkaline Phosphatase: 262 U/L — ABNORMAL HIGH (ref 38–126)
Anion gap: 10 (ref 5–15)
BUN: 9 mg/dL (ref 8–23)
CO2: 25 mmol/L (ref 22–32)
Calcium: 8.6 mg/dL — ABNORMAL LOW (ref 8.9–10.3)
Chloride: 103 mmol/L (ref 98–111)
Creatinine, Ser: 0.87 mg/dL (ref 0.61–1.24)
GFR calc Af Amer: >60 mL/min (ref >60)
GFR calc non Af Amer: >60 mL/min (ref >60)
Glucose, Bld: 120 mg/dL — ABNORMAL HIGH (ref 70–99)
Potassium: 3.5 mmol/L (ref 3.5–5.1)
Sodium: 138 mmol/L (ref 135–145)
TOTAL PROTEIN: 6.1 g/dL — AB (ref 6.5–8.1)
Total Bilirubin: 3.3 mg/dL — ABNORMAL HIGH (ref 0.3–1.2)

## 2018-06-25 LAB — CBC
HCT: 34.2 % — ABNORMAL LOW (ref 39.0–52.0)
Hemoglobin: 12 g/dL — ABNORMAL LOW (ref 13.0–17.0)
MCH: 29.6 pg (ref 26.0–34.0)
MCHC: 35.1 g/dL (ref 30.0–36.0)
MCV: 84.4 fL (ref 80.0–100.0)
Platelets: 102 10*3/uL — ABNORMAL LOW (ref 150–400)
RBC: 4.05 MIL/uL — ABNORMAL LOW (ref 4.22–5.81)
RDW: 13 % (ref 11.5–15.5)
WBC: 3.2 10*3/uL — ABNORMAL LOW (ref 4.0–10.5)
nRBC: 0 % (ref 0.0–0.2)

## 2018-06-25 SURGERY — ERCP, WITH INTERVENTION IF INDICATED
Anesthesia: General

## 2018-06-25 MED ORDER — AMOXICILLIN-POT CLAVULANATE 875-125 MG PO TABS: 1.0000 | ORAL_TABLET | Freq: Two times a day (BID) | ORAL | 0 refills | Status: AC

## 2018-06-25 MED ORDER — LACTINEX PO CHEW: 1.0000 | CHEWABLE_TABLET | Freq: Three times a day (TID) | ORAL | 0 refills | Status: DC

## 2018-06-25 MED ORDER — PROPOFOL 10 MG/ML IV BOLUS
INTRAVENOUS | Status: DC | PRN
  Administered 2018-06-25: 200 mg via INTRAVENOUS

## 2018-06-25 MED ORDER — FENTANYL CITRATE (PF) 100 MCG/2ML IJ SOLN
INTRAMUSCULAR | Status: AC
  Filled 2018-06-25: qty 2

## 2018-06-25 MED ORDER — GLUCAGON HCL RDNA (DIAGNOSTIC) 1 MG IJ SOLR
INTRAMUSCULAR | Status: DC | PRN
  Administered 2018-06-25 (×2): 0.25 mg via INTRAVENOUS

## 2018-06-25 MED ORDER — INDOMETHACIN 50 MG RE SUPP: 100.0000 mg | Freq: Once | RECTAL | Status: DC

## 2018-06-25 MED ORDER — HEPARIN SOD (PORK) LOCK FLUSH 100 UNIT/ML IV SOLN
500.0000 [IU] | Freq: Once | INTRAVENOUS | Status: DC
  Filled 2018-06-25: qty 5

## 2018-06-25 MED ORDER — FENTANYL CITRATE (PF) 100 MCG/2ML IJ SOLN: 25.0000 ug | INTRAMUSCULAR | Status: DC | PRN

## 2018-06-25 MED ORDER — INDOMETHACIN 50 MG RE SUPP
RECTAL | Status: DC | PRN
  Administered 2018-06-25: 100 mg via RECTAL

## 2018-06-25 MED ORDER — ONDANSETRON HCL 4 MG/2ML IJ SOLN
INTRAMUSCULAR | Status: DC | PRN
  Administered 2018-06-25: 4 mg via INTRAVENOUS

## 2018-06-25 MED ORDER — DEXAMETHASONE SODIUM PHOSPHATE 10 MG/ML IJ SOLN
INTRAMUSCULAR | Status: DC | PRN
  Administered 2018-06-25: 10 mg via INTRAVENOUS

## 2018-06-25 MED ORDER — INDOMETHACIN 50 MG RE SUPP
RECTAL | Status: AC
  Filled 2018-06-25: qty 2

## 2018-06-25 MED ORDER — LACTATED RINGERS IV SOLN
INTRAVENOUS | Status: DC | PRN
  Administered 2018-06-25: 13:00:00 via INTRAVENOUS

## 2018-06-25 MED ORDER — OXYCODONE HCL 5 MG PO TABS: 5.0000 mg | ORAL_TABLET | ORAL | 0 refills | Status: DC | PRN

## 2018-06-25 MED ORDER — ROCURONIUM BROMIDE 10 MG/ML (PF) SYRINGE
PREFILLED_SYRINGE | INTRAVENOUS | Status: DC | PRN
  Administered 2018-06-25: 50 mg via INTRAVENOUS

## 2018-06-25 MED ORDER — LIDOCAINE 2% (20 MG/ML) 5 ML SYRINGE
INTRAMUSCULAR | Status: DC | PRN
  Administered 2018-06-25: 60 mg via INTRAVENOUS

## 2018-06-25 MED ORDER — SODIUM CHLORIDE 0.9 % IV SOLN
INTRAVENOUS | Status: DC | PRN
  Administered 2018-06-25: 40 mL

## 2018-06-25 MED ORDER — EPHEDRINE SULFATE-NACL 50-0.9 MG/10ML-% IV SOSY
PREFILLED_SYRINGE | INTRAVENOUS | Status: DC | PRN
  Administered 2018-06-25: 10 mg via INTRAVENOUS

## 2018-06-25 MED ORDER — ONDANSETRON HCL 4 MG PO TABS: 4.0000 mg | ORAL_TABLET | Freq: Four times a day (QID) | ORAL | 0 refills | Status: DC | PRN

## 2018-06-25 MED ORDER — GLUCAGON HCL RDNA (DIAGNOSTIC) 1 MG IJ SOLR
INTRAMUSCULAR | Status: AC
  Filled 2018-06-25: qty 1

## 2018-06-25 MED ORDER — FENTANYL CITRATE (PF) 250 MCG/5ML IJ SOLN
INTRAMUSCULAR | Status: DC | PRN
  Administered 2018-06-25: 100 ug via INTRAVENOUS
  Administered 2018-06-25 (×2): 50 ug via INTRAVENOUS

## 2018-06-25 MED ORDER — SUGAMMADEX SODIUM 200 MG/2ML IV SOLN
INTRAVENOUS | Status: DC | PRN
  Administered 2018-06-25: 200 mg via INTRAVENOUS

## 2018-06-25 MED ORDER — PROMETHAZINE HCL 25 MG/ML IJ SOLN: 6.2500 mg | INTRAMUSCULAR | Status: DC | PRN

## 2018-06-25 NOTE — Anesthesia Preprocedure Evaluation (Addendum)
Anesthesia Evaluation  Patient identified by MRN, date of birth, ID band Patient awake    Reviewed: Allergy & Precautions, NPO status , Patient's Chart, lab work & pertinent test results  History of Anesthesia Complications Negative for: history of anesthetic complications  Airway Mallampati: II  TM Distance: >3 FB Neck ROM: Full    Dental  (+) Dental Advisory Given, Partial Upper   Pulmonary neg pulmonary ROS,    Pulmonary exam normal breath sounds clear to auscultation       Cardiovascular hypertension, Pt. on medications Normal cardiovascular exam Rhythm:Regular Rate:Normal     Neuro/Psych negative neurological ROS     GI/Hepatic negative GI ROS, Neg liver ROS, Metastatic pancreatic cancer s/p biliary stent placement, now with jaundice/likely cholangitis   Endo/Other  negative endocrine ROS  Renal/GU negative Renal ROS     Musculoskeletal negative musculoskeletal ROS (+)   Abdominal   Peds  Hematology  (+) anemia , Hgb 12.0, plts 102 on 06/25/18   Anesthesia Other Findings Day of surgery medications reviewed with the patient.  Reproductive/Obstetrics                            Anesthesia Physical Anesthesia Plan  ASA: III  Anesthesia Plan: General   Post-op Pain Management:    Induction: Intravenous  PONV Risk Score and Plan: 2 and Treatment may vary due to age or medical condition, Ondansetron and Dexamethasone  Airway Management Planned: Oral ETT  Additional Equipment:   Intra-op Plan:   Post-operative Plan: Extubation in OR  Informed Consent: I have reviewed the patients History and Physical, chart, labs and discussed the procedure including the risks, benefits and alternatives for the proposed anesthesia with the patient or authorized representative who has indicated his/her understanding and acceptance.   Dental advisory given  Plan Discussed with:  CRNA  Anesthesia Plan Comments:        Anesthesia Quick Evaluation

## 2018-06-25 NOTE — Op Note (Signed)
[facility] Patient Name: Cory Kelly Procedure Date: 06/25/2018 MRN: [id number] Attending MD: Justice Britain , MD Date of Birth: May 08, 1956 CSN: [id number] Age: 62 Admit Type: Inpatient Procedure:                ERCP Indications:              Bile duct stone(s), Abnormal abdominal CT,                            Jaundice, Elevated liver enzymes, Stent change,                            Prior Endoscopic Retrograde Cholangiopancreatography Providers:                Justice Britain, MD, Cleda Daub, RN, Tinnie Gens, Technician, Caryl Pina CRNA Referring MD:             Izola Price. Darnell Level Medicines:                General Anesthesia, Indomethacin 100 mg PR,                            Antibiotics were not administered as he is                            currently on Zosyn and had dose earlier in day. Complications:            No immediate complications. Estimated Blood Loss:     Estimated blood loss was minimal. Procedure:                Pre-Anesthesia Assessment:                           - Prior to the procedure, a History and Physical                            was performed, and patient medications and                            allergies were reviewed. The patient's tolerance of                            previous anesthesia was also reviewed. The risks                            and benefits of the procedure and the sedation                            options and risks were discussed with the patient.                            All questions were answered, and informed consent  was obtained. Prior Anticoagulants: The patient has                            taken no previous anticoagulant or antiplatelet                            agents. ASA Grade Assessment: III - A patient with                            severe systemic disease. After reviewing the risks                            and  benefits, the patient was deemed in                            satisfactory condition to undergo the procedure.                           After obtaining informed consent, the scope was                            passed under direct vision. Throughout the                            procedure, the patient's blood pressure, pulse, and                            oxygen saturations were monitored continuously. The                            [id number]-Q180V ([id number]) Olympus ERCP was introduced                            through the mouth, and used to inject contrast into                            and used to inject contrast into the bile duct. The                            ERCP was accomplished without difficulty. The                            patient tolerated the procedure. Fluoroscopic                            images on Canopy. Scope In: Scope Out: Findings:      A metal biliary stent was visible on the scout film.      The upper GI tract was traversed under direct vision without detailed       examination. An acquired malignant-appearing, intrinsic moderate       stenosis was found in the D1/D2 angle just proximal to the ampulla but       was able to be traversed. A biliary sphincterotomy had been performed  previously and the sphincterotomy appeared open. One metal biliary stent       originating in the biliary tree was emerging from the major papilla. The       stent was visibly occluded without significant biliary flow.      A short 0.035 inch Soft Jagwire was passed into the biliary tree. The       Autotome sphincterotome was passed over the guidewire and the bile duct       was then deeply cannulated. Contrast was injected. I personally       interpreted the bile duct images. Ductal flow of contrast was adequate.       Image quality was adequate. Contrast extended to the hepatic ducts.       Opacification of the entire biliary tree except for the gallbladder was        successful. The lower third of the main duct contained filling defects       thought to be sludge. The lower third of the main duct was dilated as a       result of the previously placed metal biliary stent. The largest       diameter was 10 mm. The middle third of the main bile duct at the       proximal end of the biliary stent showed evidence of a abrupt change in       caliber to normal size of the biliary tree. Going along with this is the       middle third of the main bile duct, upper third of the main bile duct       and left and right hepatic ducts and all intrahepatic branches were       normal in size and caliber. To discover objects, the biliary tree was       swept with a retrieval balloon starting at the bifurcation. Significant       amounts of sludge and debris were swept from the duct and from within       the stent. An occlusion cholangiogram was performed that showed no       further significant biliary pathology. Although flow was appropriate       through the metal biliary stent, in an effort to ensure continued bile       passage, decision was made to place a plastic stent within the       previously placed metal stent. One 10 Fr by 7 cm plastic biliary stent       with a single external flap and a single internal flap was placed into       the common bile duct. Bile flowed through the stent. The stent was in       good position.      A pancreatogram was not performed.      The duodenoscope was withdrawn from the patient. 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                           - Prior biliary sphincterotomy appeared open with  one visibly occluded stent from the biliary tree                            was seen in the major papilla.                           - The fluoroscopic examination was suspicious for                             sludge within the stent.                           - The lower third of the main bile duct was dilated                            as a result of the previously placed stent,                            however, the rest of the biliary tree from the                            proximal extent of the metal biliary stent up to                            the intrahepatics was more normal sized. This goes                            against significant biliary obstruction, but no                            doubt that the stent was occluded.                           - The biliary tree was swept and sludge and debris                            were found. Occlusion cholangiogram showed                            improvement in the passage of flow within the stent.                           - Decision made to ensure biliary drainage with                            placement of one plastic biliary stent into the                            common bile duct through the previously placed                            stent. Moderate Sedation:      Not Applicable - Patient had care per  Anesthesia. Recommendation:           - The patient will be observed post-procedure,                            until all discharge criteria are met.                           - Return patient to hospital ward for ongoing care.                           - Check liver enzymes (AST, ALT, alkaline                            phosphatase, bilirubin) in the morning if he                            remains in the hospital, otherwise within 1-week to                            ensure continued improvement.                           - Would continue antibiotics for 5-days total (if                            no contraindication then Ciprofloxacin 500 mg BID).                           - Observe patient's clinical course.                           - Watch for pancreatitis, bleeding, perforation,                            and  cholangitis.                           - Return to this GI lab for plastic stent exchange                            at ERCP in 4-6 months.                           - The findings and recommendations were discussed                            with the patient.                           - The findings and recommendations were discussed                            with the patient's family. Procedure Code(s):        --- Professional ---  11914, Endoscopic retrograde                            cholangiopancreatography (ERCP); with removal and                            exchange of stent(s), biliary or pancreatic duct,                            including pre- and post-dilation and guide wire                            passage, when performed, including sphincterotomy,                            when performed, each stent exchanged                           43264, Endoscopic retrograde                            cholangiopancreatography (ERCP); with removal of                            calculi/debris from biliary/pancreatic duct(s) Diagnosis Code(s):        --- Professional ---                           K31.5, Obstruction of duodenum                           T85.590A, Other mechanical complication of bile                            duct prosthesis, initial encounter                           K80.50, Calculus of bile duct without cholangitis                            or cholecystitis without obstruction                           R17, Unspecified jaundice                           R74.8, Abnormal levels of other serum enzymes                           Z46.59, Encounter for fitting and adjustment of                            other gastrointestinal appliance and device                           Z98.890, Other specified postprocedural states  R93.5, Abnormal findings on diagnostic imaging of                            other abdominal regions,  including retroperitoneum CPT copyright 2018 [employer] Association. All rights reserved. The codes documented in this report are preliminary and upon coder review may  be revised to meet current compliance requirements. Justice Britain, MD 06/25/2018 2:31:59 PM Number of Addenda: 0

## 2018-06-25 NOTE — Interval H&P Note (Signed)
History and Physical Interval Note:  06/25/2018 12:45 PM  Cory Kelly  has presented today for surgery, with the diagnosis of pancreatic cancer , jaundice  The various methods of treatment have been discussed with the patient and family. After consideration of risks, benefits and other options for treatment, the patient has consented to  Procedure(s) with comments: ENDOSCOPIC RETROGRADE CHOLANGIOPANCREATOGRAPHY (ERCP) (N/A) - with stent placement as a surgical intervention .  The patient's history has been reviewed, patient examined, no change in status, stable for surgery.  I have reviewed the patient's chart and labs.  Questions were answered to the patient's satisfaction.    The risks of an ERCP were discussed at length, including but not limited to the risk of perforation, bleeding, abdominal pain, post-ERCP pancreatitis (while usually mild can be severe and even life threatening).  Looking at imaging and seeing how quickly LFTs are improving would suggest possibility of transient obstruction.  However, the imaging did not suggest significant biliary dilation.  Possible that stent has fore-shortened a bit and if we can clean out stent and ensure no kinking then do no further stenting.  May consider additional stenting within the stent and/or plastic stent within, if concern for ability to remove the previously placed stent.    [employer]

## 2018-06-25 NOTE — Transfer of Care (Signed)
Immediate Anesthesia Transfer of Care Note  Patient: Cory Kelly  Procedure(s) Performed: ENDOSCOPIC RETROGRADE CHOLANGIOPANCREATOGRAPHY (ERCP) (N/A )  Patient Location: Endoscopy Unit  Anesthesia Type:General  Level of Consciousness: awake, alert , oriented and patient cooperative  Airway & Oxygen Therapy: Patient Spontanous Breathing and Patient connected to face mask oxygen  Post-op Assessment: Report given to RN, Post -op Vital signs reviewed and stable and Patient moving all extremities  Post vital signs: Reviewed and stable  Last Vitals:  Vitals Value Taken Time  BP    Temp    Pulse 59 06/25/2018  2:23 PM  Resp 12 06/25/2018  2:23 PM  SpO2 100 % 06/25/2018  2:23 PM  Vitals shown include unvalidated device data.  Last Pain:  Vitals:   06/25/18 1215  TempSrc: Oral  PainSc: 0-No pain         Complications: No apparent anesthesia complications

## 2018-06-25 NOTE — Anesthesia Procedure Notes (Signed)
Procedure Name: Intubation Date/Time: 06/25/2018 1:08 PM Performed by: Mitzie Na, CRNA Pre-anesthesia Checklist: Patient identified, Emergency Drugs available, Suction available, Patient being monitored and Timeout performed Patient Re-evaluated:Patient Re-evaluated prior to induction Oxygen Delivery Method: Circle system utilized Preoxygenation: Pre-oxygenation with 100% oxygen Induction Type: IV induction Ventilation: Mask ventilation without difficulty Laryngoscope Size: Mac and 4 Grade View: Grade I Tube type: Oral Tube size: 7.5 mm Number of attempts: 1 Airway Equipment and Method: Stylet Placement Confirmation: ETT inserted through vocal cords under direct vision,  positive ETCO2 and breath sounds checked- equal and bilateral Secured at: 24 cm Tube secured with: Tape Dental Injury: Teeth and Oropharynx as per pre-operative assessment

## 2018-06-25 NOTE — Discharge Instructions (Signed)
1)Call or Return if fevers, Chills, persistent nausea or vomiting , also call if abdominal pain especially if abdominal pain after eating 2) keep your appointment with Dr. Learta Codding on 07/02/2018, you will need to repeat CBC and CMP during the visit 3) you need to follow-up with Dr. Rush Landmark the GI physician in about 3 to 4 months for possible removal of the plastic stent in your bile duct 4) maintain adequate hydration--continue to drink fluids 5) please take medications as prescribed

## 2018-06-25 NOTE — Discharge Summary (Signed)
Cory Kelly, is a 62 y.o. male  DOB 1956-06-25  MRN [id number].  Admission date:  06/22/2018  Admitting Physician  Aline August, MD  Discharge Date:  06/25/2018   Primary MD  Mancel Bale, PA-C  Recommendations for primary care physician for things to follow:   1)Call or Return if fevers, Chills, persistent nausea or vomiting , also call if abdominal pain especially if abdominal pain after eating 2) keep your appointment with Dr. Learta Codding on 07/02/2018, you will need to repeat CBC and CMP during the visit 3) you need to follow-up with Dr. Rush Landmark the GI physician in about 3 to 4 months for possible removal of the plastic stent in your bile duct 4) maintain adequate hydration--continue to drink fluids 5) please take medications as prescribed   Admission Diagnosis  ca of pancreas   Discharge Diagnosis  ca of pancreas    Active Problems:   Essential hypertension   Elevated LFTs   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[place])   Cholangitis      Past Medical History:  Diagnosis Date  . Abdominal pain    due to bloating  . Bloating   . Cancer ([place])    skin cancer  . Essential hypertension   . Fatigue   . History of weight loss   . HOH (hard of hearing)    no hearing aids  . Neuralgia   . Stress    loss of father in Dec 2018.    Past Surgical History:  Procedure Laterality Date  . APPENDECTOMY    . BILIARY STENT PLACEMENT N/A 06/25/2018   Procedure: BILIARY STENT PLACEMENT;  Surgeon: Rush Landmark Telford Nab., MD;  Location: [facility];  Service: Gastroenterology;  Laterality: N/A;  . ENDOSCOPIC RETROGRADE CHOLANGIOPANCREATOGRAPHY (ERCP) WITH PROPOFOL N/A 08/10/2017   Procedure: ENDOSCOPIC RETROGRADE CHOLANGIOPANCREATOGRAPHY (ERCP) WITH PROPOFOL;  Surgeon: Milus Banister, MD;  Location: [facility];  Service: Endoscopy;  Laterality: N/A;  . ERCP N/A 06/25/2018   Procedure: ENDOSCOPIC  RETROGRADE CHOLANGIOPANCREATOGRAPHY (ERCP);  Surgeon: Irving Copas., MD;  Location: [facility];  Service: Gastroenterology;  Laterality: N/A;  . EUS N/A 08/10/2017   Procedure: UPPER ENDOSCOPIC ULTRASOUND (EUS) RADIAL;  Surgeon: Milus Banister, MD;  Location: [facility];  Service: Endoscopy;  Laterality: N/A;  . EYE SURGERY     lasik/left eye  . IR FLUORO GUIDE PORT INSERTION RIGHT  08/30/2017  . IR US GUIDE VASC ACCESS RIGHT  08/30/2017  . REMOVAL OF STONES  06/25/2018   Procedure: REMOVAL OF STONES;  Surgeon: Rush Landmark Telford Nab., MD;  Location: [facility];  Service: Gastroenterology;;  . SKIN CANCER EXCISION         HPI  from the history and physical done on the day of admission:     Chief Complaint: Chills and weakness  HPI: Cory Kelly is a 62 y.o. male with medical history significant of metastatic pancreatic adenocarcinoma status post chemotherapy and indwelling biliary stent and being planned for resumption of chemotherapy after holidays by Dr. Learta Codding, hypertension  presented today to Dr. Carin Hock office with complains of chills and weakness for the last few days. Patient developed chills during basketball game earlier this week. He has been having chills for the last two nights with weakness and not feeling well. No documented fever. He also has intermittent nausea along with some abdominal discomfort but no abdominal pain. He has noticed dark urine for the last few days and his wife noticed yellowing of his eyes today.  Patient had blood work done at Dr. Carin Hock office today which showed elevated bilirubin and LFTs.  Dr. Learta Codding spoke to Dr. Jacobs/gastroenterology who recommended admission for antibiotics and possible ERCP.     Hospital Course:   Brief Summary 62 y.o.male with pancreatic cancer dxed 08/2017, s/p prior ERCP with placement of a covered metal stent on 08/16/2017 by Dr. Clare Gandy, status post prior chemotherapy, and s/p SBRT to the  pancreas in October, In November CA-19-9 significantly elevated at 2138, and subsequent MRI showed a 6 mm lesion in the right lobe of the liver and a new 8 mm lesion just superior to the gallbladder, admitted 06/22/2018 as a direct admission from Dr. Odis Hollingshead office with chills abdominal pain and concerns about cholangitis and possible progression of his pancreatic malignancy  Plan:- 1)Possible Cholangitis---  currently afebrile, presenting with chills and abdominal pain, patient has metastatic pancreatic adenocarcinoma with indwelling covered mental biliary stent, status post ERCP on 06/25/2018 with placement of plastic stent and clean as of metal stent, LFTs and bilirubin are trending down, treated with IV Zosyn, will discharge home on p.o. Augmentin,   2)Pancreatic Cancer--??  Progression, GI and oncology consult appreciated,  patient is status post ERCP on 06/25/2018 by Dr. Rush Landmark  with cleanout of old metal stent and placement of new plastic stent , follow-up with oncologist Dr. Learta Codding  for reevaluation and repeat CMP and CBC on 06/15/2018 sooner if symptoms worsen  3)Anemia and thrombocytopenia--- due to underlying pancreatic malignancy,   4)HTN-stable, resume home medications  Code Status : Full code  Disposition Plan  :  Home with wife  Consults  :  Gi/Oncology  Discharge Condition: Stable, afebrile, pain-free  Follow UP--- oncology and GI  Diet and Activity recommendation:  As advised  Discharge Instructions    Discharge Instructions    Call MD for:  difficulty breathing, headache or visual disturbances   Complete by:  As directed    Call MD for:  persistant dizziness or light-headedness   Complete by:  As directed    Call MD for:  persistant nausea and vomiting   Complete by:  As directed    Call MD for:  severe uncontrolled pain   Complete by:  As directed    Call MD for:  temperature >100.4   Complete by:  As directed    Diet - low sodium heart healthy    Complete by:  As directed    Discharge instructions   Complete by:  As directed    1)Call or Return if fevers, Chills, persistent nausea or vomiting , also call if abdominal pain especially if abdominal pain after eating 2) keep your appointment with Dr. Learta Codding on 07/02/2018, you will need to repeat CBC and CMP during the visit 3) you need to follow-up with Dr. Rush Landmark the GI physician in about 3 to 4 months for possible removal of the plastic stent in your bile duct 4) maintain adequate hydration--continue to drink fluids 5) please take medications as prescribed   Increase activity slowly  Complete by:  As directed         Discharge Medications     Allergies as of 06/25/2018   No Known Allergies     Medication List    TAKE these medications   acetaminophen 500 MG tablet Commonly known as:  TYLENOL Take 500-1,000 mg by mouth every 6 (six) hours as needed for moderate pain.   amLODipine 10 MG tablet Commonly known as:  NORVASC Take 10 mg by mouth every evening.   amoxicillin-clavulanate 875-125 MG tablet Commonly known as:  AUGMENTIN Take 1 tablet by mouth 2 (two) times daily for 5 days.   gabapentin 100 MG capsule Commonly known as:  NEURONTIN Take 100 mg in am and 200 mg at bedtime What changed:    how much to take  how to take this  when to take this  additional instructions   lactobacillus acidophilus & bulgar chewable tablet Chew 1 tablet by mouth 3 (three) times daily with meals.   lidocaine-prilocaine cream Commonly known as:  EMLA Apply 1 application topically as needed. Apply to portacath site 1-2 hours prior to use   olmesartan 40 MG tablet Commonly known as:  BENICAR Take 1 tablet (40 mg total) by mouth daily.   ondansetron 4 MG tablet Commonly known as:  ZOFRAN Take 1 tablet (4 mg total) by mouth every 6 (six) hours as needed for nausea.   oxyCODONE 5 MG immediate release tablet Commonly known as:  Oxy IR/ROXICODONE Take 1 tablet (5  mg total) by mouth every 4 (four) hours as needed for moderate pain.   prochlorperazine 10 MG tablet Commonly known as:  COMPAZINE Take 1 tablet (10 mg total) by mouth every 6 (six) hours as needed for nausea or vomiting.       Major procedures and Radiology Reports - PLEASE review detailed and final reports for all details, in brief -    Ct Abdomen Pelvis W Contrast  Result Date: 06/22/2018 CLINICAL DATA:  Jaundice, abdominal pain, and fever. Metastatic pancreatic adenocarcinoma, indwelling biliary stent. EXAM: CT ABDOMEN AND PELVIS WITH CONTRAST TECHNIQUE: Multidetector CT imaging of the abdomen and pelvis was performed using the standard protocol following bolus administration of intravenous contrast. CONTRAST:  149mL OMNIPAQUE IOHEXOL 300 MG/ML  SOLN COMPARISON:  Multiple exams, including 05/09/2018 FINDINGS: Lower chest: Three-vessel coronary artery atherosclerotic calcification is present. Linear subsegmental atelectasis anteriorly in the right lower lobe. Hepatobiliary: Increased conspicuity of a lesion along the dome of segment 8 laterally, 1.6 by 1.4 cm on image 16/2. This is increased from prior exams. A hypoenhancing liver lesion measuring 1.4 by 1.5 cm in segment 5 of the liver adjacent to the gallbladder is present on image 32/2. This measured 0.8 cm in diameter on 05/09/2017. Pneumobilia is present. Currently no significant biliary dilatation. Biliary stent remains in place and appears to traverse the pancreatic head mass and extend into the duodenum. There is mild wall thickening in the gallbladder, especially along the neck of the gallbladder, significance uncertain. A 1.1 cm hypodense lesion above the gallbladder 1 on image 26/2 could represent a subtle metastatic lesion. Pancreas: Highly indistinctly marginated hypoenhancing tumor in the head of the pancreas traversed by the stent. I measure this roughly at about 1.9 by 3.9 cm but some of transversely the size of the tumor is  difficult to estimate as a could extend around the stent although some of the findings around the stent may simply be inflammatory. Frontal back the pancreatic tumor is thought to be about  2.1 cm on image 37/2, roughly stable from 05/09/2018 outside exam from [facility]. There is abnormal soft tissue compatible with tumor tracking on around at least 50% of the inferior margin of the portal vein and causing narrowing of the portal vein on image 45/4. This tumor extends to the confluence of the splenic and superior mesenteric veins, and mildly contacts the superior mesenteric vein. There is also abnormal soft tissue thickening extending from this region along the right margin of the celiac trunk, and contacting about 148 degrees of the surface of the celiac trunk on image 47/4. This is a roughly similar amount of tumor contact compared to 05/09/2018 exam. Spleen: Mild stable splenomegaly. Adrenals/Urinary Tract: Suspected small right adrenal adenoma. 9 mm right kidney lower pole nonobstructive renal calculus. Right kidney lower pole cyst. 3 mm left kidney upper pole nonobstructive renal calculus. Urinary bladder unremarkable. Stomach/Bowel: Indistinct tissue planes around the descending duodenum possibly from some degree of local inflammation. Vascular/Lymphatic: Aortoiliac atherosclerotic vascular disease. Vascular contact related to the pancreatic mass is noted in the pancreas section above. Reproductive: Mild prostatomegaly indenting the bladder base. Other: No supplemental non-categorized findings. Musculoskeletal: Lower thoracic and lumbar spondylosis. IMPRESSION: 1. Pneumobilia with mild intrahepatic biliary dilatation mostly segment 7 of the liver, with the common bile duct above the level of the stent only measuring about 7 mm in diameter. The stent traverses the pancreatic mass and extends into the duodenum. 2. Increase in size in 2 lesions suspected of being metastatic lesions. One of these is in  segment 5 of the liver adjacent the gallbladder and measures 1.5 by 1.4 cm, previously 0.8 cm on the outside [facility] study of 05/09/2018. The other dominant lesion is in the dome of the right hepatic lobe, and measures 1.6 by 1.4 cm, previously significantly smaller and poorly seen on the more recent study. There also some other vague hypodensities which could possibly be metastatic. 3. The pancreatic mass itself appears similar, spanning about 2.1 cm anterior-posterior, and with soft tissue component causing some narrowing of the portal vein, abutting the confluence of the SMV and splenic vein, and also with a soft tissue component extending around an estimated 148 degrees of the surface of the celiac trunk. 4. Indistinct tissue planes around the duodenum, along with wall thickening in the gallbladder and especially the gallbladder neck. Possibility of cholecystitis or duodenal inflammation is raised. Localized groove pancreatitis superimposed on the underlying pancreatic mass would be difficult to exclude. 5. Stable mild splenomegaly. 6. Bilateral nonobstructive nephrolithiasis. 7. Mild prostatomegaly. 8.  Aortic Atherosclerosis (ICD10-I70.0).  Coronary atherosclerosis. Electronically Signed   By: Van Clines M.D.   On: 06/22/2018 21:02   Portable Chest 1 View  Result Date: 06/22/2018 CLINICAL DATA:  Chest pain EXAM: PORTABLE CHEST 1 VIEW COMPARISON:  01/10/2018 FINDINGS: Right-sided central venous port tip over the SVC. No acute consolidation or effusion. Normal heart size. No pneumothorax. IMPRESSION: No active disease. Electronically Signed   By: Donavan Foil M.D.   On: 06/22/2018 19:09   Dg Ercp  Result Date: 06/25/2018 CLINICAL DATA:  61 year old male with occluded biliary stent EXAM: ERCP TECHNIQUE: Multiple spot images obtained with the fluoroscopic device and submitted for interpretation post-procedure. FLUOROSCOPY TIME:  Fluoroscopy Time:  5 minutes 47 seconds Radiation Exposure  Index (if provided by the fluoroscopic device): 77.83 mGy COMPARISON:  CT scan of the abdomen and pelvis 06/22/2018 FINDINGS: A total of 11 intraoperative spot radiographs are submitted for review. The images demonstrate a flexible endoscope in  the descending duodenum with wire cannulation of the intrahepatic ducts. Cholangiogram demonstrates at least partial occlusion of the previously placed metallic stent. Subsequent images demonstrate balloon sweep of the stent as well as placement of a plastic biliary stent coaxially through the existing metallic stent. IMPRESSION: ERCP with stent manipulation and placement of a new plastic stent as above. These images were submitted for radiologic interpretation only. Please see the procedural report for the amount of contrast and the fluoroscopy time utilized. Electronically Signed   By: Jacqulynn Cadet M.D.   On: 06/25/2018 14:37    Micro Results    Recent Results (from the past 240 hour(s))  Urine culture     Status: None   Collection Time: 06/22/18  5:27 PM  Result Value Ref Range Status   Specimen Description   Final    URINE, CLEAN CATCH Performed at [facility], [place] [address] [place], [place] [address]    Special Requests   Final    NONE Performed at [facility], [place] [address], [place], [place] [address]    Culture   Final    NO GROWTH Performed at [facility], [place] [address], [place], [place] [address]    Report Status 06/24/2018 FINAL  Final  Culture, blood (routine x 2)     Status: None (Preliminary result)   Collection Time: 06/22/18  5:53 PM  Result Value Ref Range Status   Specimen Description   Final    BLOOD RIGHT HAND Performed at [place] [address] [place], [place] [address]    Special Requests   Final    BOTTLES DRAWN AEROBIC AND ANAEROBIC Blood Culture adequate volume Performed at [place] [address], [place],  [place] [address]    Culture   Final    NO GROWTH 3 DAYS Performed at [facility], [place] [address] [place], [place] [address]    Report Status PENDING  Incomplete  Culture, blood (routine x 2)     Status: None (Preliminary result)   Collection Time: 06/22/18  5:59 PM  Result Value Ref Range Status   Specimen Description   Final    BLOOD LEFT HAND Performed at [place] [address] [place], [place] [address]    Special Requests   Final    BOTTLES DRAWN AEROBIC AND ANAEROBIC Blood Culture adequate volume Performed at [place] [address], [place], [place] [address]    Culture   Final    NO GROWTH 3 DAYS Performed at [facility], [place] [address], [place], [place] [address]    Report Status PENDING  Incomplete       Today   Subjective    Cory Kelly today has no concerns, tolerated ERCP procedure well, tolerating oral intake well, wife at bedside, requesting discharge home          Patient has been seen and examined prior to discharge   Objective   Blood pressure (!) 144/87, pulse (!) 56, temperature 98.2 F (36.8 C), temperature source Oral, resp. rate 16, height 6\' 3"  (1.905 m), weight 107 kg, SpO2 93 %.   Intake/Output Summary (Last 24 hours) at 06/25/2018 1723 Last data filed at 06/25/2018 1418 Gross per 24 hour  Intake 750 ml  Output -  Net 750 ml    Exam Gen:- Awake Alert, no acute distress in no apparent distress  HEENT:- .AT, has sclera icterus  Neck-Supple Neck,No JVD,.  Lungs-  CTAB , good air movement bilaterally  CV- S1, S2 normal, regular Abd-  +ve B.Sounds, Abd Soft, No significant abdominal tenderness,    Extremity/Skin:- No  edema,   good pulses, jaundice is improving Psych-affect is appropriate, oriented x3 Neuro-no new focal deficits, no tremors    Data Review   CBC w Diff:  Lab Results  Component Value Date   WBC 3.2 (L) 06/25/2018   HGB 12.0 (L) 06/25/2018   HGB 11.9 (L)  06/22/2018   HCT 34.2 (L) 06/25/2018   PLT 102 (L) 06/25/2018   PLT 85 (L) 06/22/2018   LYMPHOPCT 7 06/23/2018   MONOPCT 9 06/23/2018   EOSPCT 2 06/23/2018   BASOPCT 1 06/23/2018    CMP:  Lab Results  Component Value Date   NA 138 06/25/2018   K 3.5 06/25/2018   CL 103 06/25/2018   CO2 25 06/25/2018   BUN 9 06/25/2018   CREATININE 0.87 06/25/2018   CREATININE 0.84 06/22/2018   CREATININE 1.27 10/27/2015   PROT 6.1 (L) 06/25/2018   ALBUMIN 3.3 (L) 06/25/2018   BILITOT 3.3 (H) 06/25/2018   BILITOT 7.7 (HH) 06/22/2018   ALKPHOS 262 (H) 06/25/2018   AST 62 (H) 06/25/2018   AST 125 (H) 06/22/2018   ALT 157 (H) 06/25/2018   ALT 342 (HH) 06/22/2018  .   Total Discharge time is about 33 minutes  Roxan Hockey M.D on 06/25/2018 at 5:23 PM  Pager---[phone]  Go to [email or web address] - password [email or web address] for contact info  [facility] - Office  [phone]

## 2018-06-25 NOTE — Anesthesia Postprocedure Evaluation (Signed)
Anesthesia Post Note  Patient: Cory Kelly  Procedure(s) Performed: ENDOSCOPIC RETROGRADE CHOLANGIOPANCREATOGRAPHY (ERCP) (N/A ) BILIARY STENT PLACEMENT (N/A ) REMOVAL OF STONES     Patient location during evaluation: PACU Anesthesia Type: General Level of consciousness: awake and alert Pain management: pain level controlled Vital Signs Assessment: post-procedure vital signs reviewed and stable Respiratory status: spontaneous breathing, nonlabored ventilation, respiratory function stable and patient connected to nasal cannula oxygen Cardiovascular status: blood pressure returned to baseline and stable Postop Assessment: no apparent nausea or vomiting Anesthetic complications: no    Last Vitals:  Vitals:   06/25/18 1450 06/25/18 1519  BP: (!) 150/81 (!) 144/87  Pulse: (!) 58 (!) 56  Resp: 12 16  Temp:  36.8 C  SpO2: 97% 93%    Last Pain:  Vitals:   06/25/18 1519  TempSrc: Oral  PainSc:                  Cory Kelly

## 2018-06-27 LAB — CULTURE, BLOOD (ROUTINE X 2)
Culture: NO GROWTH
Culture: NO GROWTH
SPECIAL REQUESTS: ADEQUATE
Special Requests: ADEQUATE

## 2018-07-03 ENCOUNTER — Inpatient Hospital Stay: Payer: BLUE CROSS/BLUE SHIELD

## 2018-07-03 ENCOUNTER — Encounter: Payer: Self-pay | Admitting: Nurse Practitioner

## 2018-07-03 ENCOUNTER — Telehealth: Payer: Self-pay | Admitting: Nurse Practitioner

## 2018-07-03 ENCOUNTER — Inpatient Hospital Stay (HOSPITAL_BASED_OUTPATIENT_CLINIC_OR_DEPARTMENT_OTHER): Payer: BLUE CROSS/BLUE SHIELD | Admitting: Nurse Practitioner

## 2018-07-03 VITALS — BP 150/89 | HR 56 | Temp 97.5°F | Resp 17 | Ht 75.0 in | Wt 235.8 lb

## 2018-07-03 DIAGNOSIS — I10 Essential (primary) hypertension: Secondary | ICD-10-CM | POA: Diagnosis not present

## 2018-07-03 DIAGNOSIS — R911 Solitary pulmonary nodule: Secondary | ICD-10-CM | POA: Diagnosis not present

## 2018-07-03 DIAGNOSIS — Z9689 Presence of other specified functional implants: Secondary | ICD-10-CM

## 2018-07-03 DIAGNOSIS — R17 Unspecified jaundice: Secondary | ICD-10-CM | POA: Diagnosis not present

## 2018-07-03 DIAGNOSIS — C250 Malignant neoplasm of head of pancreas: Secondary | ICD-10-CM

## 2018-07-03 DIAGNOSIS — C787 Secondary malignant neoplasm of liver and intrahepatic bile duct: Secondary | ICD-10-CM | POA: Diagnosis not present

## 2018-07-03 DIAGNOSIS — R21 Rash and other nonspecific skin eruption: Secondary | ICD-10-CM | POA: Diagnosis not present

## 2018-07-03 DIAGNOSIS — Z7189 Other specified counseling: Secondary | ICD-10-CM

## 2018-07-03 LAB — CMP (CANCER CENTER ONLY)
ALBUMIN: 3.5 g/dL (ref 3.5–5.0)
ALT: 125 U/L — ABNORMAL HIGH (ref 0–44)
ANION GAP: 9 (ref 5–15)
AST: 50 U/L — ABNORMAL HIGH (ref 15–41)
Alkaline Phosphatase: 293 U/L — ABNORMAL HIGH (ref 38–126)
BUN: 11 mg/dL (ref 8–23)
CO2: 27 mmol/L (ref 22–32)
Calcium: 9 mg/dL (ref 8.9–10.3)
Chloride: 106 mmol/L (ref 98–111)
Creatinine: 0.81 mg/dL (ref 0.61–1.24)
GFR, Est AFR Am: >60 mL/min (ref >60)
GFR, Estimated: >60 mL/min (ref >60)
Glucose, Bld: 102 mg/dL — ABNORMAL HIGH (ref 70–99)
Potassium: 4.2 mmol/L (ref 3.5–5.1)
Sodium: 142 mmol/L (ref 135–145)
Total Bilirubin: 1.7 mg/dL — ABNORMAL HIGH (ref 0.3–1.2)
Total Protein: 6.6 g/dL (ref 6.5–8.1)

## 2018-07-03 MED ORDER — LIDOCAINE-PRILOCAINE 2.5-2.5 % EX CREA: 1.0000 "application " | TOPICAL_CREAM | CUTANEOUS | 2 refills | Status: DC | PRN

## 2018-07-03 MED ORDER — GABAPENTIN 100 MG PO CAPS: ORAL_CAPSULE | ORAL | 0 refills | Status: DC

## 2018-07-03 NOTE — Telephone Encounter (Signed)
Printed calendar and avs. °

## 2018-07-03 NOTE — Progress Notes (Addendum)
Cory Kelly OFFICE PROGRESS NOTE   Diagnosis: Pancreas cancer  INTERVAL HISTORY:   Cory Kelly returns as scheduled.  He was hospitalized 06/22/2018 with biliary obstruction.  He underwent ERCP on 06/25/2018.  Debris was removed from the existing biliary stent.  A new plastic stent was placed.  He was discharged home 06/25/2018.  He reports he is feeling much better.  No fever or chills.  No nausea or vomiting.  No diarrhea.  He denies abdominal pain.  Objective:  Vital signs in last 24 hours:  Blood pressure (!) 150/89, pulse (!) 56, temperature (!) 97.5 F (36.4 C), temperature source Oral, resp. rate 17, height '6\' 3"'$  (1.905 m), weight 235 lb 12.8 oz (107 kg), SpO2 100 %.    HEENT: No thrush or ulcers.  No scleral icterus. Resp: Lungs clear bilaterally. Cardio: Regular rate and rhythm. GI: Abdomen soft and nontender.  No hepatomegaly. Vascular: No leg edema. Neuro: Alert and oriented.  Port-A-Cath without erythema.  Lab Results:  Lab Results  Component Value Date   WBC 3.2 (L) 06/25/2018   HGB 12.0 (L) 06/25/2018   HCT 34.2 (L) 06/25/2018   MCV 84.4 06/25/2018   PLT 102 (L) 06/25/2018   NEUTROABS 3.3 06/23/2018    Imaging:  No results found.  Medications: I have reviewed the patient's current medications.  Assessment/Plan: 1.  Pancreas head mass  CT abdomen/pelvis 08/03/2017-fullness in the head of the pancreas with stranding in the peripancreatic fat and pancreatic ductal dilatation.   MRI abdomen 08/04/2017-findings favored to represent acute on chronic pancreatitis; equivocal soft tissue fullness within the head and uncinate process of the pancreas; indeterminate too small to characterize right hepatic lobe lesion; small volume abdominal ascites; splenomegaly.   CA-19-9 elevated at 977on 08/04/2017.  Status postupper EUS 08/10/2017-findings of an irregular masslike region in the pancreatic head measuring approximately 2.7 cm. This directly  abutted the main portal vein but no other major vascular structures. Biopsies obtained with preliminary cytology positive for malignancy, likely adenocarcinoma. The final report is pending. The common bile duct and main pancreatic duct were both dilated. ERCP was then proceeded with forstent placement. Multiple attempts were made tocannulate the bile duct without success. The procedure was aborted.  08/16/2017 ERCP with placement of a metal biliary stent in the common bile duct by Dr. Francella Solian at [facility].  Cycle 1 FOLFIRINOX 08/31/2017  Cycle 2 FOLFIRINOX 09/14/2017  Cycle 3 FOLFIRINOX 09/28/2017  Cycle 4 FOLFIRINOX 10/12/2017 (oxaliplatin dose reduced secondary to thrombocytopenia)  Cycle 5 FOLFIRINOX 10/26/2017  Cycle 6 FOLFIRINOX 11/09/2017  Restaging CTs at [facility] 11/29/2017-no definitive evidence of distant metastatic disease. 2 subcentimeter liver lesions described on prior MRI not visualized on CT. Ill-defined pancreatic head mass stable to decreased in size measuring 2.1 x 2 cm. Peripancreatic inflammatory stranding decreased from prior. No biliary ductal dilatation. Celiac axis less than 180 degrees abutment. Common hepatic artery with greater than 180 degrees encasement; superior mesenteric artery with short segment less than 180 degrees abutment; portal vein/superior mesenteric vein with greater than 180 degrees encasement of the extrahepatic portal vein with associated circumferential narrowing at the portomesenteric, overall substantially improved from prior examination. Now less than 180 degree abutment of the SMV. The portal vein and SMV remain patent. Splenic vein patent.  Cycle 1 gemcitabine/Abraxane 12/06/2017  Cycle 2 gemcitabine/Abraxane 12/20/2017  Cycle 3 gemcitabine/Abraxane 01/03/2018  Cycle 4 gemcitabine/Abraxane 01/17/2018  Cycle 5 gemcitabine 01/31/2018 (Abraxane held due to neuropathy)  Cycle 6 gemcitabine 02/14/2018 (Abraxane held due to neuropathy)  Cycle 7  gemcitabine 02/28/2018 (Abraxane held due to neuropathy)  CT chest/abdomen/pelvis at [facility] 03/08/2018- stable appearance of previously identified infiltrating pancreatic head mass. No CT evidence of metastatic disease.  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 Ill-defined pancreatic head mass not felt to be significantly changed.  CT chest/abdomen/pelvis 05/10/2018- ill-defined hypoattenuating pancreatic head mass and ill-defined soft tissue abutting the celiac axis with mildly increased dilatation of the main pancreatic duct. New soft tissue nodule along the right upper lobe bronchus; hypoattenuating liver lesions concerning for metastasis.  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  The stent traverses the pancreatic mass and extends into the duodenum.  Increase in size of 2 liver lesions.  Pancreatic mass appears similar.  Indistinct tissue planes around the duodenum along with wall thickening in the gallbladder and especially the gallbladder neck. 2. Hypertension 3. Port-A-Cath placement 08/30/2017 4. History of elevated bilirubin-questionGilbert's syndrome 5. Mild leukopenia,  thrombocytopenia-question secondary to portal hypertension/splenomegaly;09/14/2017 white count improved, platelet count stable;progressive thrombocytopenia following gemcitabine/Abraxane 6. Kidney stones 7. Fever following cycles 2 and 3 gemcitabine/Abraxane,? Fever related to gemcitabine 8.   Jaundice, elevated liver enzymes and bilirubin 06/22/2018; status post ERCP 06/25/2018 with debris removed from the existing stent and a new plastic stent placed.   Disposition: Cory Kelly appears stable.  He will return to the lab today for a follow-up chemistry panel.  Dr. Benay Spice recommends initiating treatment with FOLFIRINOX in the next 2 to 3 weeks.  Cory Kelly has received this regimen in the past and is familiar with potential toxicities.  We reviewed these again at today's visit.  He is also interested in exploring clinical trial availability.  He will return for lab, follow-up and FOLFIRINOX in about 3 weeks.  We will look into clinical trial availability in the interim.  Patient seen with Dr. Benay Spice.    Ned Card ANP/GNP-BC   07/03/2018  10:02 AM  This was a shared visit with Ned Card.  Cory Kelly is recovering from the admission with biliary stent occlusion.  The hyperbilirubinemia has improved. He has metastatic pancreas cancer.  We discussed treatment options with Cory Kelly and his wife.  I recommend resuming FOLFIRINOX if there is not a local clinical trial available for him.  I will investigate clinical trial availability at the local universities.  Julieanne Manson, MD

## 2018-07-04 DIAGNOSIS — Z7189 Other specified counseling: Secondary | ICD-10-CM | POA: Insufficient documentation

## 2018-07-04 NOTE — Progress Notes (Signed)
DISCONTINUE OFF PATHWAY REGIMEN - Pancreatic   [id number]:Nab-Paclitaxel (Abraxane(R)) 125 mg/m2 D1, 8, 15 + Gemcitabine 1,000 mg/m2 D1, 8, 15 q28 Days:   A cycle is every 28 days:     Nab-paclitaxel (protein bound)      Gemcitabine   **Always confirm dose/schedule in your pharmacy ordering system**  REASON: Disease Progression PRIOR TREATMENT: Off Pathway: Nab-Paclitaxel (Abraxane(R)) 125 mg/m2 D1, 8, 15 + Gemcitabine 1,000 mg/m2 D1, 8, 15 q28 Days TREATMENT RESPONSE: Progressive Disease (PD)  START ON PATHWAY REGIMEN - Pancreatic     A cycle is every 14 days:     Oxaliplatin      Leucovorin      Irinotecan      5-Fluorouracil      5-Fluorouracil   **Always confirm dose/schedule in your pharmacy ordering system**  Patient Characteristics: Adenocarcinoma, Metastatic Disease, First Line, PS = 0, 1 Histology: Adenocarcinoma Current evidence of distant metastases<= Yes AJCC T Category: Staged < 8th Ed. AJCC N Category: Staged < 8th Ed. AJCC M Category: Staged < 8th Ed. AJCC 8 Stage Grouping: Staged < 8th Ed. Line of Therapy: First Line  Intent of Therapy: Non-Curative / Palliative Intent, Discussed with Patient

## 2018-07-06 ENCOUNTER — Encounter: Payer: Self-pay | Admitting: Oncology

## 2018-07-10 ENCOUNTER — Other Ambulatory Visit: Payer: Self-pay | Admitting: *Deleted

## 2018-07-10 DIAGNOSIS — C250 Malignant neoplasm of head of pancreas: Secondary | ICD-10-CM

## 2018-07-10 NOTE — Progress Notes (Signed)
Referral placed to return to Dr. Berneice Gandy for treatment options at patient request. In basket message to HIM as well.

## 2018-07-12 ENCOUNTER — Encounter: Payer: Self-pay | Admitting: Oncology

## 2018-07-12 NOTE — Telephone Encounter (Signed)
Sent note to MD

## 2018-07-16 ENCOUNTER — Telehealth: Payer: Self-pay

## 2018-07-16 NOTE — Telephone Encounter (Signed)
Mrs. Parisi called requesting that Foundation 1 results be faxed to Dr. Berneice Gandy @ [phone]. Completed. She also called to ask for advice about upcoming transition, need to be off chemo for 1 month prior to starting clinical trial. Message sent to Dr. Benay Spice and Ned Card NP to relay question.

## 2018-07-19 ENCOUNTER — Ambulatory Visit: Payer: BLUE CROSS/BLUE SHIELD

## 2018-07-19 ENCOUNTER — Ambulatory Visit (INDEPENDENT_AMBULATORY_CARE_PROVIDER_SITE_OTHER)
Admission: RE | Admit: 2018-07-19 | Discharge: 2018-07-19 | Disposition: A | Discharge status: Home / Self Care | Payer: BLUE CROSS/BLUE SHIELD | Source: Ambulatory Visit | Attending: Gastroenterology | Admitting: Gastroenterology

## 2018-07-19 ENCOUNTER — Encounter (HOSPITAL_COMMUNITY): Payer: Self-pay | Admitting: *Deleted

## 2018-07-19 ENCOUNTER — Telehealth: Payer: Self-pay | Admitting: *Deleted

## 2018-07-19 ENCOUNTER — Ambulatory Visit (INDEPENDENT_AMBULATORY_CARE_PROVIDER_SITE_OTHER): Payer: BLUE CROSS/BLUE SHIELD | Admitting: Gastroenterology

## 2018-07-19 ENCOUNTER — Inpatient Hospital Stay: Payer: BLUE CROSS/BLUE SHIELD

## 2018-07-19 ENCOUNTER — Other Ambulatory Visit: Payer: Self-pay

## 2018-07-19 ENCOUNTER — Encounter: Payer: Self-pay | Admitting: Gastroenterology

## 2018-07-19 ENCOUNTER — Inpatient Hospital Stay: Payer: BLUE CROSS/BLUE SHIELD | Attending: Oncology | Admitting: Oncology

## 2018-07-19 ENCOUNTER — Telehealth: Payer: Self-pay

## 2018-07-19 ENCOUNTER — Other Ambulatory Visit: Payer: BLUE CROSS/BLUE SHIELD

## 2018-07-19 VITALS — BP 150/73 | HR 58 | Temp 98.5°F | Resp 17 | Ht 75.0 in | Wt 229.3 lb

## 2018-07-19 VITALS — BP 116/74 | HR 78 | Ht 75.0 in | Wt 232.5 lb

## 2018-07-19 DIAGNOSIS — Z9889 Other specified postprocedural states: Secondary | ICD-10-CM | POA: Diagnosis not present

## 2018-07-19 DIAGNOSIS — C787 Secondary malignant neoplasm of liver and intrahepatic bile duct: Secondary | ICD-10-CM | POA: Diagnosis not present

## 2018-07-19 DIAGNOSIS — Z9689 Presence of other specified functional implants: Secondary | ICD-10-CM | POA: Diagnosis not present

## 2018-07-19 DIAGNOSIS — R17 Unspecified jaundice: Secondary | ICD-10-CM

## 2018-07-19 DIAGNOSIS — T859XXA Unspecified complication of internal prosthetic device, implant and graft, initial encounter: Secondary | ICD-10-CM

## 2018-07-19 DIAGNOSIS — C250 Malignant neoplasm of head of pancreas: Secondary | ICD-10-CM

## 2018-07-19 DIAGNOSIS — K8041 Calculus of bile duct with cholecystitis, unspecified, with obstruction: Secondary | ICD-10-CM

## 2018-07-19 DIAGNOSIS — Z95828 Presence of other vascular implants and grafts: Secondary | ICD-10-CM

## 2018-07-19 DIAGNOSIS — C259 Malignant neoplasm of pancreas, unspecified: Secondary | ICD-10-CM | POA: Diagnosis not present

## 2018-07-19 DIAGNOSIS — R748 Abnormal levels of other serum enzymes: Secondary | ICD-10-CM | POA: Insufficient documentation

## 2018-07-19 DIAGNOSIS — R911 Solitary pulmonary nodule: Secondary | ICD-10-CM

## 2018-07-19 DIAGNOSIS — Z955 Presence of coronary angioplasty implant and graft: Secondary | ICD-10-CM | POA: Insufficient documentation

## 2018-07-19 DIAGNOSIS — I10 Essential (primary) hypertension: Secondary | ICD-10-CM | POA: Insufficient documentation

## 2018-07-19 LAB — CMP (CANCER CENTER ONLY)
ALT: 356 U/L (ref 0–44)
AST: 162 U/L — ABNORMAL HIGH (ref 15–41)
Albumin: 3.2 g/dL — ABNORMAL LOW (ref 3.5–5.0)
Alkaline Phosphatase: 586 U/L — ABNORMAL HIGH (ref 38–126)
Anion gap: 8 (ref 5–15)
BUN: 10 mg/dL (ref 8–23)
CO2: 26 mmol/L (ref 22–32)
Calcium: 8.8 mg/dL — ABNORMAL LOW (ref 8.9–10.3)
Chloride: 104 mmol/L (ref 98–111)
Creatinine: 0.77 mg/dL (ref 0.61–1.24)
Glucose, Bld: 135 mg/dL — ABNORMAL HIGH (ref 70–99)
Potassium: 3.5 mmol/L (ref 3.5–5.1)
Sodium: 138 mmol/L (ref 135–145)
Total Bilirubin: 8.7 mg/dL (ref 0.3–1.2)
Total Protein: 6.4 g/dL — ABNORMAL LOW (ref 6.5–8.1)

## 2018-07-19 LAB — CBC WITH DIFFERENTIAL (CANCER CENTER ONLY)
Abs Immature Granulocytes: 0.02 10*3/uL (ref 0.00–0.07)
Basophils Absolute: 0 10*3/uL (ref 0.0–0.1)
Basophils Relative: 0 %
Eosinophils Absolute: 0.1 10*3/uL (ref 0.0–0.5)
Eosinophils Relative: 2 %
HEMATOCRIT: 34 % — AB (ref 39.0–52.0)
Hemoglobin: 12.2 g/dL — ABNORMAL LOW (ref 13.0–17.0)
Immature Granulocytes: 0 %
LYMPHS ABS: 0.4 10*3/uL — AB (ref 0.7–4.0)
Lymphocytes Relative: 9 %
MCH: 29.8 pg (ref 26.0–34.0)
MCHC: 35.9 g/dL (ref 30.0–36.0)
MCV: 83.1 fL (ref 80.0–100.0)
Monocytes Absolute: 0.5 10*3/uL (ref 0.1–1.0)
Monocytes Relative: 12 %
Neutro Abs: 3.5 10*3/uL (ref 1.7–7.7)
Neutrophils Relative %: 77 %
Platelet Count: 107 10*3/uL — ABNORMAL LOW (ref 150–400)
RBC: 4.09 MIL/uL — ABNORMAL LOW (ref 4.22–5.81)
RDW: 13.1 % (ref 11.5–15.5)
WBC Count: 4.6 10*3/uL (ref 4.0–10.5)
nRBC: 0 % (ref 0.0–0.2)

## 2018-07-19 MED ORDER — HEPARIN SOD (PORK) LOCK FLUSH 100 UNIT/ML IV SOLN
500.0000 [IU] | Freq: Once | INTRAVENOUS | Status: AC
  Administered 2018-07-19: 500 [IU]
  Filled 2018-07-19: qty 5

## 2018-07-19 MED ORDER — CIPROFLOXACIN HCL 500 MG PO TABS: 500.0000 mg | ORAL_TABLET | Freq: Two times a day (BID) | ORAL | 0 refills | Status: DC

## 2018-07-19 MED ORDER — SODIUM CHLORIDE 0.9% FLUSH
10.0000 mL | Freq: Once | INTRAVENOUS | Status: AC
  Administered 2018-07-19: 10 mL
  Filled 2018-07-19: qty 10

## 2018-07-19 NOTE — Telephone Encounter (Signed)
-----   Message from Tania Ade, RN sent at 07/19/2018  9:39 AM EST ----- Regarding: ? Stent issue Patient called with fever and shaking chills yesterday--temp 100 with Tylenol on board/ Today, no fever or chills, but feeling bloated, mild nausea, no appetite and urine is very dark and stool has "floaters". Wife thinks his eyes look yellow. Dr. Benay Spice is asking if there is any way he can be seen there today?  Thanks, Merceda Elks, RN

## 2018-07-19 NOTE — Telephone Encounter (Signed)
Called to report episode of shaking chills yesterday with temp 100 w/Tylenol on board yesterday. General malaise and stomach feels bloated, no appetite, mild nausea. Urine getting darker. Today, no fever or chills, but other symptoms persist with urine getting darker and stools today are "floaters". Notified Dr. Benay Spice: He will need a liver panel today, but see if GI can see him today.

## 2018-07-19 NOTE — Telephone Encounter (Signed)
Call received from [facility] wife Cory Kelly.  He's had increased temperature at times.  Yesterday afternoon and evening T = 100 F.  Urine became dark in color with even more darkening today.  Does he need to be seen?  The last time this happened he was hospitalized.  Return call to him on his mobile number [phone]."

## 2018-07-19 NOTE — Telephone Encounter (Signed)
Patient called with fever and shaking chills yesterday--temp 100 with Tylenol on board/  Today, no fever or chills, but feeling bloated, mild nausea, no appetite and urine is very dark and stool has "floaters". Wife thinks his eyes look yellow.  Dr. Benay Spice is asking if there is any way he can be seen there today?   Pt scheduled to see Dr. Rush Landmark today at 2:45pm. Merceda Elks RN @ [facility] to notify pt of appt.

## 2018-07-19 NOTE — H&P (View-Only) (Signed)
[place] VISIT   Primary Care Provider Mancel Bale, PA-C [address] [place] [address] [phone]  Patient Profile: Cory Kelly is a 63 y.o. male with a pmh significant for metastatic pancreatic cancer (status post biliary stenting), hypertension, nephrolithiasis.  The patient presents to the [facility] for an evaluation and management of problem(s) noted below:  Problem List 1. Jaundice   2. Disorder of bile duct stent, initial encounter   3. History of ERCP   4. Pancreatic cancer metastasized to liver [facility])     History of Present Illness: This is a patient that I met in the hospital in December in the setting of abnormal liver biochemical testing and jaundice.  I performed an ERCP in December and found that his previously placed partially covered metal stent was obstructed with biliary sludge and debris.  At the very proximal portion of the stent I had concern for previous foreshortening such that the patient may have had some obstructive jaundice as result of the stent having foreshortened.  I decided to place a plastic stent inside the previously placed partially covered because I thought I saw possibly the gallbladder fill and with a partially covered metal stent being in place for almost a year the likelihood of being able to remove that without having to fracture of the stent would be low.  He was eventually discharged from the hospital with antibiotics and followed up with oncology.  He describes doing extremely well with improvement in his jaundice and his urine.  He had seen oncology with plans for possible clinical therapies at [facility] however nothing was available.  Decision was made that he would pursue further chemotherapy here locally.  Unfortunately in the course of the last 2 days the patient had the development of dark urine as well as subjective fevers and chills up to 100F and also the onset of abdominal  bloating discomfort and also changes in his stool pattern.  He is now having more floating stools.  While the stent had been in place and seem to have been functioning he was not having any issues with his stools.  He has had a relatively stable weight since his discharge.  He has no fevers today and feels better.  He was seen by oncology on an urgent basis and placed per this urgent clinic visit and GI.  He had liver tests as noted below.  His bilirubin has risen to the 8 range as of numbers today.  GI Review of Systems Positive as above including some nausea Negative for vomiting, dysphagia, early satiety, melena, hematochezia  Review of Systems General: As per HPI above but denies fevers today HEENT: Denies oral lesions Cardiovascular: Denies chest pain Pulmonary: Denies shortness of breath Gastroenterological: See HPI Genitourinary: Denies darkened urine Hematological: Denies easy bruising Dermatological: Positive for jaundice Psychological: Mood is anxious   Medications Current Outpatient Medications  Medication Sig Dispense Refill  . acetaminophen (TYLENOL) 500 MG tablet Take 500-1,000 mg by mouth every 6 (six) hours as needed for moderate pain.    [name] Kitchen amLODipine (NORVASC) 10 MG tablet Take 10 mg by mouth every evening.     . gabapentin (NEURONTIN) 100 MG capsule Take 200 mg in am and 200 mg at bedtime 270 capsule 0  . lidocaine-prilocaine (EMLA) cream Apply 1 application topically as needed. Apply to portacath site 1-2 hours prior to use 30 g 2  . olmesartan (BENICAR) 40 MG tablet Take 1 tablet (40 mg  total) by mouth daily. 90 tablet 0  . ondansetron (ZOFRAN) 4 MG tablet Take 1 tablet (4 mg total) by mouth every 6 (six) hours as needed for nausea. 20 tablet 0  . oxyCODONE (OXY IR/ROXICODONE) 5 MG immediate release tablet Take 1 tablet (5 mg total) by mouth every 4 (four) hours as needed for moderate pain. 15 tablet 0  . prochlorperazine (COMPAZINE) 10 MG tablet Take 1 tablet (10 mg  total) by mouth every 6 (six) hours as needed for nausea or vomiting. 30 tablet 2  . ciprofloxacin (CIPRO) 500 MG tablet Take 1 tablet (500 mg total) by mouth 2 (two) times daily. 20 tablet 0   No current facility-administered medications for this visit.     Allergies No Known Allergies  Histories Past Medical History:  Diagnosis Date  . Abdominal pain    due to bloating  . Bloating   . Cancer ([place])    skin cancer  . Essential hypertension   . Fatigue   . History of kidney stones    passed  . History of weight loss   . HOH (hard of hearing)    no hearing aids  . Neuralgia   . Pancreatic cancer ([place])   . Stress    loss of father in Dec 2018.   Past Surgical History:  Procedure Laterality Date  . APPENDECTOMY    . BILIARY STENT PLACEMENT N/A 06/25/2018   Procedure: BILIARY STENT PLACEMENT;  Surgeon: Rush Landmark Telford Nab., MD;  Location: [facility];  Service: Gastroenterology;  Laterality: N/A;  . ENDOSCOPIC RETROGRADE CHOLANGIOPANCREATOGRAPHY (ERCP) WITH PROPOFOL N/A 08/10/2017   Procedure: ENDOSCOPIC RETROGRADE CHOLANGIOPANCREATOGRAPHY (ERCP) WITH PROPOFOL;  Surgeon: Milus Banister, MD;  Location: [facility];  Service: Endoscopy;  Laterality: N/A;  . ERCP N/A 06/25/2018   Procedure: ENDOSCOPIC RETROGRADE CHOLANGIOPANCREATOGRAPHY (ERCP);  Surgeon: Irving Copas., MD;  Location: [facility];  Service: Gastroenterology;  Laterality: N/A;  . EUS N/A 08/10/2017   Procedure: UPPER ENDOSCOPIC ULTRASOUND (EUS) RADIAL;  Surgeon: Milus Banister, MD;  Location: [facility];  Service: Endoscopy;  Laterality: N/A;  . EYE SURGERY     lasik/left eye  . IR FLUORO GUIDE PORT INSERTION RIGHT  08/30/2017  . IR US GUIDE VASC ACCESS RIGHT  08/30/2017  . REMOVAL OF STONES  06/25/2018   Procedure: REMOVAL OF STONES;  Surgeon: Rush Landmark Telford Nab., MD;  Location: [facility];  Service: Gastroenterology;;  . SKIN CANCER EXCISION     Social History   Socioeconomic History    . Marital status: Married    Spouse name: Not on file  . Number of children: Not on file  . Years of education: Not on file  . Highest education level: Not on file  Occupational History    Employer: [employer]  . Financial resource strain: Not on file  . Food insecurity:    Worry: Not on file    Inability: Not on file  . Transportation needs:    Medical: Not on file    Non-medical: Not on file  Tobacco Use  . Smoking status: Never Smoker  . Smokeless tobacco: Never Used  Substance and Sexual Activity  . Alcohol use: No  . Drug use: No  . Sexual activity: Yes    Partners: Female    Birth control/protection: None  Lifestyle  . Physical activity:    Days per week: Not on file    Minutes per session: Not on file  . Stress: Not on file  Relationships  . Social connections:    Talks on phone: Not on file    Gets together: Not on file    Attends religious service: Not on file    Active member of club or organization: Not on file    Attends meetings of clubs or organizations: Not on file    Relationship status: Not on file  . Intimate partner violence:    Fear of current or ex partner: Not on file    Emotionally abused: Not on file    Physically abused: Not on file    Forced sexual activity: Not on file  Other Topics Concern  . Not on file  Social History Narrative   Married   2 children, 2 grand children   Coaches womens basketball at [employer] History  Problem Relation Age of Onset  . Hyperlipidemia Father   . Heart disease Father   . Dementia Father   . Memory loss Mother   . Colon cancer Neg Hx   . Esophageal cancer Neg Hx   . Inflammatory bowel disease Neg Hx   . Liver disease Neg Hx   . Rectal cancer Neg Hx   . Stomach cancer Neg Hx   . Pancreatic cancer Neg Hx    I have reviewed his medical, social, and family history in detail and updated the electronic medical record as necessary.    PHYSICAL EXAMINATION  BP  116/74   Pulse 78   Ht '6\' 3"'$  (1.905 m)   Wt 232 lb 8 oz (105.5 kg)   BMI 29.06 kg/m  Wt Readings from Last 3 Encounters:  07/19/18 232 lb 8 oz (105.5 kg)  07/19/18 229 lb 4.8 oz (104 kg)  07/03/18 235 lb 12.8 oz (107 kg)  GEN: NAD, appears stated age, doesn't appear chronically ill, accompanied by wife PSYCH: Cooperative, without pressured speech EYE: Sclerae icteric ENT: MMM, without oral ulcers NECK: Supple CV: RR without R/Gs  RESP: CTAB posteriorly, without wheezing GI: NABS, soft, minimal tenderness to palpation in the right upper quadrant and midepigastrium, nondistended, without rebound or guarding  MSK/EXT: No lower extremity edema SKIN: Visibly jaundiced NEURO:  Alert & Oriented x 3, no focal deficits   REVIEW OF DATA  I reviewed the following data at the time of this encounter:  GI Procedures and Studies  December 2019 ERCP - 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 - Prior biliary sphincterotomy appeared open with one visibly occluded stent from the biliary tree was seen in the major papilla. - The fluoroscopic examination was suspicious for sludge within the stent. - The lower third of the main bile duct was dilated as a result of the previously placed stent, however, the rest of the biliary tree from the proximal extent of the metal biliary stent up to the intrahepatics was more normal sized. This goes against significant biliary obstruction, but no doubt that the stent was occluded. - The biliary tree was swept and sludge and debris were found. Occlusion cholangiogram showed improvement in the passage of flow within the stent. - Decision made to ensure biliary drainage with placement of one plastic biliary stent into the common bile duct through the previously placed stent.  Laboratory Studies  Reviewed in epic  Imaging Studies  No new imaging studies since hospitalization   ASSESSMENT  Mr. Larmer is a 63  y.o. male with a pmh significant for metastatic pancreatic cancer (status post biliary stenting),  hypertension, nephrolithiasis.  The patient is seen today for evaluation and management of:  1. Jaundice   2. Disorder of bile duct stent, initial encounter   3. History of ERCP   4. Pancreatic cancer metastasized to liver [facility])    The patient's presentation over the course the last couple days is concerning for stent dysfunction.  It is much sooner than I would anticipate for the stent to get clogged so I am more concerned about stent dislodgment either into the duct or out of the duct that is causing issues (more likely out of the duct).  With that being said I do have a concern about our clinical ability to maintain things in the course of the coming months.  With having a partially covered metal stent I think the ability to remove that without potentially unraveling or having issues as a result of proximal ingrowth of tumor and tissue will be very hard.  He has a functioning gallbladder still and it looks like based on my prior ERCP imaging fluoroscopically that his cystic duct has a higher takeoff.  This was likely the reason that a fully covered metal stent was not previously placed back in February 2019.  With this being said sometimes we can use fully covered metal stents within partially covered metal stents as a result of trying to fraction the tissue and cause necrosis that eventually we can then remove the metal stent fully and then replaced with a new longer uncovered metal stent.  Overall, at this point in time with his bilirubin now being in the 8 range and his recent fevers and chills I am concerned about cholangitis.  We should plan on attempting an ERCP with removal of the biliary stent and then replacement of the stent with either two 8.5 [place] or 2-10 French stents to help maintain the stent in position and allow adequate drainage.  Unfortunately, I have no availability the rest of this week.   I have asked my colleague Dr. Ardis Hughs, whom this patient had met previously and had diagnosed his pancreas cancer, but his availability while in the hospital.  He is graciously made himself available for procedure.  After speaking with the 2 hospitals we have availability at [facility] tomorrow.  I think it is most reasonable that we proceed with an ERCP sooner rather than later.  I would like for him to begin antibiotics and will start him on ciprofloxacin 500 twice daily for the next 5 to 10 days (will prescribe for 10 days).  Long-term ERCP management will be based on how he does with follow-up here.  In anticipation of his ERCP tomorrow I would like to obtain at least a KUB to get a sense of what the plastic biliary stent is done and look for pneumobilia.  The risks of an ERCP were discussed at length, including but not limited to the risk of perforation, bleeding, abdominal pain, post-ERCP pancreatitis (while usually mild can be severe and even life threatening).  The risks and benefits of endoscopic evaluation were discussed with the patient; these include but are not limited to the risk of perforation, infection, bleeding, missed lesions, lack of diagnosis, severe illness requiring hospitalization, as well as anesthesia and sedation related illnesses.  The patient is agreeable to proceed.  All patient questions were answered, to the best of my ability, and the patient agrees to the aforementioned plan of action with follow-up as indicated.   PLAN  Obtain KUB 2 view Proceed with scheduling ERCP  with my partner Dr. Ardis Hughs on 07/20/2018 at [facility] at 9 AM N.p.o. at midnight and will be given recommendations for his procedure tomorrow   Orders Placed This Encounter  Procedures  . DG Abd 2 Views    New Prescriptions   CIPROFLOXACIN (CIPRO) 500 MG TABLET    Take 1 tablet (500 mg total) by mouth 2 (two) times daily.   Modified Medications   No medications on file    Planned Follow Up: No  follow-ups on file.   Justice Britain, MD [facility] Office # [phone]

## 2018-07-19 NOTE — Patient Instructions (Signed)
You have been scheduled for an ERCP. Please follow written instructions given to you at your visit today. If you use inhalers (even only as needed), please bring them with you on the day of your procedure. Your physician has requested that you go to www.startemmi.com and enter the access code given to you at your visit today. This web site gives a general overview about your procedure. However, you should still follow specific instructions given to you by our office regarding your preparation for the procedure.  We have sent the following medications to your pharmacy for you to pick up at your convenience:  Thank you for entrusting me with your care and choosing [facility] care.  Dr Rush Landmark

## 2018-07-19 NOTE — Telephone Encounter (Signed)
Per Dr. Benay Spice: Get him to come in today for labs and see Dr. Benay Spice. Will call GI while he is here. High priority scheduling message sent. Called patient with new plan and he will keep phone with him for the call from scheduler.

## 2018-07-19 NOTE — Progress Notes (Signed)
[place] VISIT   Primary Care Provider Mancel Bale, PA-C [address] [place] [address] [phone]  Patient Profile: Cory Kelly is a 63 y.o. male with a pmh significant for metastatic pancreatic cancer (status post biliary stenting), hypertension, nephrolithiasis.  The patient presents to the [facility] for an evaluation and management of problem(s) noted below:  Problem List 1. Jaundice   2. Disorder of bile duct stent, initial encounter   3. History of ERCP   4. Pancreatic cancer metastasized to liver [facility])     History of Present Illness: This is a patient that I met in the hospital in December in the setting of abnormal liver biochemical testing and jaundice.  I performed an ERCP in December and found that his previously placed partially covered metal stent was obstructed with biliary sludge and debris.  At the very proximal portion of the stent I had concern for previous foreshortening such that the patient may have had some obstructive jaundice as result of the stent having foreshortened.  I decided to place a plastic stent inside the previously placed partially covered because I thought I saw possibly the gallbladder fill and with a partially covered metal stent being in place for almost a year the likelihood of being able to remove that without having to fracture of the stent would be low.  He was eventually discharged from the hospital with antibiotics and followed up with oncology.  He describes doing extremely well with improvement in his jaundice and his urine.  He had seen oncology with plans for possible clinical therapies at [facility] however nothing was available.  Decision was made that he would pursue further chemotherapy here locally.  Unfortunately in the course of the last 2 days the patient had the development of dark urine as well as subjective fevers and chills up to 100F and also the onset of abdominal  bloating discomfort and also changes in his stool pattern.  He is now having more floating stools.  While the stent had been in place and seem to have been functioning he was not having any issues with his stools.  He has had a relatively stable weight since his discharge.  He has no fevers today and feels better.  He was seen by oncology on an urgent basis and placed per this urgent clinic visit and GI.  He had liver tests as noted below.  His bilirubin has risen to the 8 range as of numbers today.  GI Review of Systems Positive as above including some nausea Negative for vomiting, dysphagia, early satiety, melena, hematochezia  Review of Systems General: As per HPI above but denies fevers today HEENT: Denies oral lesions Cardiovascular: Denies chest pain Pulmonary: Denies shortness of breath Gastroenterological: See HPI Genitourinary: Denies darkened urine Hematological: Denies easy bruising Dermatological: Positive for jaundice Psychological: Mood is anxious   Medications Current Outpatient Medications  Medication Sig Dispense Refill  . acetaminophen (TYLENOL) 500 MG tablet Take 500-1,000 mg by mouth every 6 (six) hours as needed for moderate pain.    [name] Kitchen amLODipine (NORVASC) 10 MG tablet Take 10 mg by mouth every evening.     . gabapentin (NEURONTIN) 100 MG capsule Take 200 mg in am and 200 mg at bedtime 270 capsule 0  . lidocaine-prilocaine (EMLA) cream Apply 1 application topically as needed. Apply to portacath site 1-2 hours prior to use 30 g 2  . olmesartan (BENICAR) 40 MG tablet Take 1 tablet (40 mg  total) by mouth daily. 90 tablet 0  . ondansetron (ZOFRAN) 4 MG tablet Take 1 tablet (4 mg total) by mouth every 6 (six) hours as needed for nausea. 20 tablet 0  . oxyCODONE (OXY IR/ROXICODONE) 5 MG immediate release tablet Take 1 tablet (5 mg total) by mouth every 4 (four) hours as needed for moderate pain. 15 tablet 0  . prochlorperazine (COMPAZINE) 10 MG tablet Take 1 tablet (10 mg  total) by mouth every 6 (six) hours as needed for nausea or vomiting. 30 tablet 2  . ciprofloxacin (CIPRO) 500 MG tablet Take 1 tablet (500 mg total) by mouth 2 (two) times daily. 20 tablet 0   No current facility-administered medications for this visit.     Allergies No Known Allergies  Histories Past Medical History:  Diagnosis Date  . Abdominal pain    due to bloating  . Bloating   . Cancer ([place])    skin cancer  . Essential hypertension   . Fatigue   . History of kidney stones    passed  . History of weight loss   . HOH (hard of hearing)    no hearing aids  . Neuralgia   . Pancreatic cancer ([place])   . Stress    loss of father in Dec 2018.   Past Surgical History:  Procedure Laterality Date  . APPENDECTOMY    . BILIARY STENT PLACEMENT N/A 06/25/2018   Procedure: BILIARY STENT PLACEMENT;  Surgeon: Rush Landmark Telford Nab., MD;  Location: [facility];  Service: Gastroenterology;  Laterality: N/A;  . ENDOSCOPIC RETROGRADE CHOLANGIOPANCREATOGRAPHY (ERCP) WITH PROPOFOL N/A 08/10/2017   Procedure: ENDOSCOPIC RETROGRADE CHOLANGIOPANCREATOGRAPHY (ERCP) WITH PROPOFOL;  Surgeon: Milus Banister, MD;  Location: [facility];  Service: Endoscopy;  Laterality: N/A;  . ERCP N/A 06/25/2018   Procedure: ENDOSCOPIC RETROGRADE CHOLANGIOPANCREATOGRAPHY (ERCP);  Surgeon: Irving Copas., MD;  Location: [facility];  Service: Gastroenterology;  Laterality: N/A;  . EUS N/A 08/10/2017   Procedure: UPPER ENDOSCOPIC ULTRASOUND (EUS) RADIAL;  Surgeon: Milus Banister, MD;  Location: [facility];  Service: Endoscopy;  Laterality: N/A;  . EYE SURGERY     lasik/left eye  . IR FLUORO GUIDE PORT INSERTION RIGHT  08/30/2017  . IR US GUIDE VASC ACCESS RIGHT  08/30/2017  . REMOVAL OF STONES  06/25/2018   Procedure: REMOVAL OF STONES;  Surgeon: Rush Landmark Telford Nab., MD;  Location: [facility];  Service: Gastroenterology;;  . SKIN CANCER EXCISION     Social History   Socioeconomic History    . Marital status: Married    Spouse name: Not on file  . Number of children: Not on file  . Years of education: Not on file  . Highest education level: Not on file  Occupational History    Employer: [employer]  . Financial resource strain: Not on file  . Food insecurity:    Worry: Not on file    Inability: Not on file  . Transportation needs:    Medical: Not on file    Non-medical: Not on file  Tobacco Use  . Smoking status: Never Smoker  . Smokeless tobacco: Never Used  Substance and Sexual Activity  . Alcohol use: No  . Drug use: No  . Sexual activity: Yes    Partners: Female    Birth control/protection: None  Lifestyle  . Physical activity:    Days per week: Not on file    Minutes per session: Not on file  . Stress: Not on file  Relationships  . Social connections:    Talks on phone: Not on file    Gets together: Not on file    Attends religious service: Not on file    Active member of club or organization: Not on file    Attends meetings of clubs or organizations: Not on file    Relationship status: Not on file  . Intimate partner violence:    Fear of current or ex partner: Not on file    Emotionally abused: Not on file    Physically abused: Not on file    Forced sexual activity: Not on file  Other Topics Concern  . Not on file  Social History Narrative   Married   2 children, 2 grand children   Coaches womens basketball at [employer] History  Problem Relation Age of Onset  . Hyperlipidemia Father   . Heart disease Father   . Dementia Father   . Memory loss Mother   . Colon cancer Neg Hx   . Esophageal cancer Neg Hx   . Inflammatory bowel disease Neg Hx   . Liver disease Neg Hx   . Rectal cancer Neg Hx   . Stomach cancer Neg Hx   . Pancreatic cancer Neg Hx    I have reviewed his medical, social, and family history in detail and updated the electronic medical record as necessary.    PHYSICAL EXAMINATION  BP  116/74   Pulse 78   Ht '6\' 3"'$  (1.905 m)   Wt 232 lb 8 oz (105.5 kg)   BMI 29.06 kg/m  Wt Readings from Last 3 Encounters:  07/19/18 232 lb 8 oz (105.5 kg)  07/19/18 229 lb 4.8 oz (104 kg)  07/03/18 235 lb 12.8 oz (107 kg)  GEN: NAD, appears stated age, doesn't appear chronically ill, accompanied by wife PSYCH: Cooperative, without pressured speech EYE: Sclerae icteric ENT: MMM, without oral ulcers NECK: Supple CV: RR without R/Gs  RESP: CTAB posteriorly, without wheezing GI: NABS, soft, minimal tenderness to palpation in the right upper quadrant and midepigastrium, nondistended, without rebound or guarding  MSK/EXT: No lower extremity edema SKIN: Visibly jaundiced NEURO:  Alert & Oriented x 3, no focal deficits   REVIEW OF DATA  I reviewed the following data at the time of this encounter:  GI Procedures and Studies  December 2019 ERCP - 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 - Prior biliary sphincterotomy appeared open with one visibly occluded stent from the biliary tree was seen in the major papilla. - The fluoroscopic examination was suspicious for sludge within the stent. - The lower third of the main bile duct was dilated as a result of the previously placed stent, however, the rest of the biliary tree from the proximal extent of the metal biliary stent up to the intrahepatics was more normal sized. This goes against significant biliary obstruction, but no doubt that the stent was occluded. - The biliary tree was swept and sludge and debris were found. Occlusion cholangiogram showed improvement in the passage of flow within the stent. - Decision made to ensure biliary drainage with placement of one plastic biliary stent into the common bile duct through the previously placed stent.  Laboratory Studies  Reviewed in epic  Imaging Studies  No new imaging studies since hospitalization   ASSESSMENT  Mr. Rostron is a 63  y.o. male with a pmh significant for metastatic pancreatic cancer (status post biliary stenting),  hypertension, nephrolithiasis.  The patient is seen today for evaluation and management of:  1. Jaundice   2. Disorder of bile duct stent, initial encounter   3. History of ERCP   4. Pancreatic cancer metastasized to liver [facility])    The patient's presentation over the course the last couple days is concerning for stent dysfunction.  It is much sooner than I would anticipate for the stent to get clogged so I am more concerned about stent dislodgment either into the duct or out of the duct that is causing issues (more likely out of the duct).  With that being said I do have a concern about our clinical ability to maintain things in the course of the coming months.  With having a partially covered metal stent I think the ability to remove that without potentially unraveling or having issues as a result of proximal ingrowth of tumor and tissue will be very hard.  He has a functioning gallbladder still and it looks like based on my prior ERCP imaging fluoroscopically that his cystic duct has a higher takeoff.  This was likely the reason that a fully covered metal stent was not previously placed back in February 2019.  With this being said sometimes we can use fully covered metal stents within partially covered metal stents as a result of trying to fraction the tissue and cause necrosis that eventually we can then remove the metal stent fully and then replaced with a new longer uncovered metal stent.  Overall, at this point in time with his bilirubin now being in the 8 range and his recent fevers and chills I am concerned about cholangitis.  We should plan on attempting an ERCP with removal of the biliary stent and then replacement of the stent with either two 8.5 [place] or 2-10 French stents to help maintain the stent in position and allow adequate drainage.  Unfortunately, I have no availability the rest of this week.   I have asked my colleague Dr. Ardis Hughs, whom this patient had met previously and had diagnosed his pancreas cancer, but his availability while in the hospital.  He is graciously made himself available for procedure.  After speaking with the 2 hospitals we have availability at [facility] tomorrow.  I think it is most reasonable that we proceed with an ERCP sooner rather than later.  I would like for him to begin antibiotics and will start him on ciprofloxacin 500 twice daily for the next 5 to 10 days (will prescribe for 10 days).  Long-term ERCP management will be based on how he does with follow-up here.  In anticipation of his ERCP tomorrow I would like to obtain at least a KUB to get a sense of what the plastic biliary stent is done and look for pneumobilia.  The risks of an ERCP were discussed at length, including but not limited to the risk of perforation, bleeding, abdominal pain, post-ERCP pancreatitis (while usually mild can be severe and even life threatening).  The risks and benefits of endoscopic evaluation were discussed with the patient; these include but are not limited to the risk of perforation, infection, bleeding, missed lesions, lack of diagnosis, severe illness requiring hospitalization, as well as anesthesia and sedation related illnesses.  The patient is agreeable to proceed.  All patient questions were answered, to the best of my ability, and the patient agrees to the aforementioned plan of action with follow-up as indicated.   PLAN  Obtain KUB 2 view Proceed with scheduling ERCP  with my partner Dr. Ardis Hughs on 07/20/2018 at [facility] at 9 AM N.p.o. at midnight and will be given recommendations for his procedure tomorrow   Orders Placed This Encounter  Procedures  . DG Abd 2 Views    New Prescriptions   CIPROFLOXACIN (CIPRO) 500 MG TABLET    Take 1 tablet (500 mg total) by mouth 2 (two) times daily.   Modified Medications   No medications on file    Planned Follow Up: No  follow-ups on file.   Justice Britain, MD [facility] Office # [phone]

## 2018-07-19 NOTE — Progress Notes (Signed)
[facility] OFFICE PROGRESS NOTE   Diagnosis: Pancreas cancer  INTERVAL HISTORY:   Mr. Aylward returns prior to a scheduled visit.  He developed Rigors while in a restaurant yesterday.  No documented fever.  He reports abdominal bloating.  He noted the onset of dark urine and jaundice yesterday. He feels better today.  He had a recent transient episode of ankle swelling and low back pain.  Objective:  Vital signs in last 24 hours:  Blood pressure (!) 150/73, pulse (!) 58, temperature 98.5 F (36.9 C), temperature source Oral, resp. rate 17, height '6\' 3"'$  (1.905 m), weight 229 lb 4.8 oz (104 kg), SpO2 100 %.    HEENT: Scleral icterus Resp: Lungs clear bilaterally Cardio: Regular rate and rhythm GI: No hepatomegaly, no mass, nontender Vascular: No leg edema  Skin: Jaundice  Portacath/PICC-without erythema  Lab Results:  Lab Results  Component Value Date   WBC 4.6 07/19/2018   HGB 12.2 (L) 07/19/2018   HCT 34.0 (L) 07/19/2018   MCV 83.1 07/19/2018   PLT 107 (L) 07/19/2018   NEUTROABS 3.5 07/19/2018    CMP  Lab Results  Component Value Date   NA 138 07/19/2018   K 3.5 07/19/2018   CL 104 07/19/2018   CO2 26 07/19/2018   GLUCOSE 135 (H) 07/19/2018   BUN 10 07/19/2018   CREATININE 0.77 07/19/2018   CALCIUM 8.8 (L) 07/19/2018   PROT 6.4 (L) 07/19/2018   ALBUMIN 3.2 (L) 07/19/2018   AST 162 (H) 07/19/2018   ALT 356 (HH) 07/19/2018   ALKPHOS 586 (H) 07/19/2018   BILITOT 8.7 (HH) 07/19/2018   GFRNONAA >60 07/19/2018   GFRAA >60 07/19/2018    Medications: I have reviewed the patient's current medications.   Assessment/Plan:  1.  Pancreas head mass  CT abdomen/pelvis 08/03/2017-fullness in the head of the pancreas with stranding in the peripancreatic fat and pancreatic ductal dilatation.   MRI abdomen 08/04/2017-findings favored to represent acute on chronic pancreatitis; equivocal soft tissue fullness within the head and uncinate process of the  pancreas; indeterminate too small to characterize right hepatic lobe lesion; small volume abdominal ascites; splenomegaly.   CA-19-9 elevated at 977on 08/04/2017.  Status postupper EUS 08/10/2017-findings of an irregular masslike region in the pancreatic head measuring approximately 2.7 cm. This directly abutted the main portal vein but no other major vascular structures. Biopsies obtained with preliminary cytology positive for malignancy, likely adenocarcinoma. The final report is pending. The common bile duct and main pancreatic duct were both dilated. ERCP was then proceeded with forstent placement. Multiple attempts were made tocannulate the bile duct without success. The procedure was aborted.  08/16/2017 ERCP with placement of a metal biliary stent in the common bile duct by Dr. Francella Solian at [facility].  Cycle 1 FOLFIRINOX 08/31/2017  Cycle 2 FOLFIRINOX 09/14/2017  Cycle 3 FOLFIRINOX 09/28/2017  Cycle 4 FOLFIRINOX 10/12/2017 (oxaliplatin dose reduced secondary to thrombocytopenia)  Cycle 5 FOLFIRINOX 10/26/2017  Cycle 6 FOLFIRINOX 11/09/2017  Rest[facility] CTs at [facility] 11/29/2017-no definitive evidence of distant metastatic disease. 2 subcentimeter liver lesions described on prior MRI not visualized on CT. Ill-defined pancreatic head mass stable to decreased in size measuring 2.1 x 2 cm. Peripancreatic inflammatory stranding decreased from prior. No biliary ductal dilatation. Celiac axis less than 180 degrees abutment. Common hepatic artery with greater than 180 degrees encasement; superior mesenteric artery with short segment less than 180 degrees abutment; portal vein/superior mesenteric vein with greater than 180 degrees encasement of the extrahepatic portal vein with  associated circumferential narrowing at the portomesenteric, overall substantially improved from prior examination. Now less than 180 degree abutment of the SMV. The portal vein and SMV remain patent. Splenic vein  patent.  Cycle 1 gemcitabine/Abraxane 12/06/2017  Cycle 2 gemcitabine/Abraxane 12/20/2017  Cycle 3 gemcitabine/Abraxane 01/03/2018  Cycle 4 gemcitabine/Abraxane 01/17/2018  Cycle 5 gemcitabine 01/31/2018 (Abraxane held due to neuropathy)  Cycle 6 gemcitabine 02/14/2018 (Abraxane held due to neuropathy)  Cycle 7 gemcitabine 02/28/2018 (Abraxane held due to neuropathy)  CT chest[facility]abdomen/pelvis at [facility] 03/08/2018- stable appearance of previously identified infiltrating pancreatic head mass. No CT evidence of metastatic disease.  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 Ill-defined pancreatic head mass not felt to be significantly changed.  CT chest/abdomen/pelvis 05/10/2018- ill-defined hypoattenuating pancreatic head mass and ill-defined soft tissue abutting the celiac axis with mildly increased dilatation of the main pancreatic duct. New soft tissue nodule along the right upper lobe bronchus; hypoattenuating liver lesions concerning for metastasis.  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  The stent traverses the pancreatic mass and extends into the duodenum.  Increase in size of 2  liver lesions.  Pancreatic mass appears similar.  Indistinct tissue planes around the duodenum along with wall thickening in the gallbladder and especially the gallbladder neck. 2. Hypertension 3. Port-A-Cath placement 08/30/2017 4. History of elevated bilirubin-questionGilbert's syndrome 5. Mild leukopenia, thrombocytopenia-question secondary to portal hypertension/splenomegaly;09/14/2017 white count improved, platelet count stable;progressive thrombocytopenia following gemcitabine/Abraxane 6. Kidney stones 7. Fever following cycles 2 and 3 gemcitabine/Abraxane,? Fever related to gemcitabine 8.   Jaundice, elevated liver enzymes and bilirubin 06/22/2018; status post ERCP 06/25/2018 with debris removed from the existing stent and a new plastic stent placed.   Disposition: Cory Kelly presents today with recurrent jaundice.  He had chills yesterday.  The liver enzymes and bilirubin are again elevated.  We contacted Dr. Rush Landmark.  He will see Mr. Razon this afternoon and decide on the indication for antibiotics, repeat ERCP, and hospital admission.  Mr. Cortright will return for an office visit as scheduled on 07/23/2018. The plan is to begin FOLFIRINOX when the liver enzymes have improved.     Betsy Coder, MD  07/19/2018  1:56 PM

## 2018-07-20 ENCOUNTER — Ambulatory Visit (HOSPITAL_COMMUNITY)
Admission: RE | Admit: 2018-07-20 | Discharge: 2018-07-20 | Disposition: A | Discharge status: Home / Self Care | Payer: BLUE CROSS/BLUE SHIELD | Attending: Gastroenterology | Admitting: Gastroenterology

## 2018-07-20 ENCOUNTER — Encounter (HOSPITAL_COMMUNITY): Payer: Self-pay | Admitting: Gastroenterology

## 2018-07-20 ENCOUNTER — Encounter (HOSPITAL_COMMUNITY)
Admission: RE | Disposition: A | Discharge status: Home / Self Care | Payer: Self-pay | Source: Home / Self Care | Attending: Gastroenterology

## 2018-07-20 ENCOUNTER — Ambulatory Visit (HOSPITAL_COMMUNITY): Payer: BLUE CROSS/BLUE SHIELD | Admitting: Anesthesiology

## 2018-07-20 ENCOUNTER — Telehealth: Payer: Self-pay

## 2018-07-20 ENCOUNTER — Other Ambulatory Visit: Payer: Self-pay

## 2018-07-20 ENCOUNTER — Ambulatory Visit (HOSPITAL_COMMUNITY): Payer: BLUE CROSS/BLUE SHIELD

## 2018-07-20 ENCOUNTER — Encounter: Payer: Self-pay | Admitting: Gastroenterology

## 2018-07-20 DIAGNOSIS — K8041 Calculus of bile duct with cholecystitis, unspecified, with obstruction: Secondary | ICD-10-CM

## 2018-07-20 DIAGNOSIS — C250 Malignant neoplasm of head of pancreas: Secondary | ICD-10-CM | POA: Diagnosis not present

## 2018-07-20 DIAGNOSIS — Z87442 Personal history of urinary calculi: Secondary | ICD-10-CM | POA: Insufficient documentation

## 2018-07-20 DIAGNOSIS — Z79899 Other long term (current) drug therapy: Secondary | ICD-10-CM | POA: Insufficient documentation

## 2018-07-20 DIAGNOSIS — T859XXA Unspecified complication of internal prosthetic device, implant and graft, initial encounter: Secondary | ICD-10-CM

## 2018-07-20 DIAGNOSIS — K831 Obstruction of bile duct: Secondary | ICD-10-CM

## 2018-07-20 DIAGNOSIS — C259 Malignant neoplasm of pancreas, unspecified: Secondary | ICD-10-CM | POA: Diagnosis not present

## 2018-07-20 DIAGNOSIS — I10 Essential (primary) hypertension: Secondary | ICD-10-CM | POA: Insufficient documentation

## 2018-07-20 DIAGNOSIS — K8050 Calculus of bile duct without cholangitis or cholecystitis without obstruction: Secondary | ICD-10-CM

## 2018-07-20 DIAGNOSIS — Z85828 Personal history of other malignant neoplasm of skin: Secondary | ICD-10-CM | POA: Insufficient documentation

## 2018-07-20 DIAGNOSIS — Z820 Family history of epilepsy and other diseases of the nervous system: Secondary | ICD-10-CM | POA: Insufficient documentation

## 2018-07-20 DIAGNOSIS — K8689 Other specified diseases of pancreas: Secondary | ICD-10-CM | POA: Insufficient documentation

## 2018-07-20 DIAGNOSIS — C787 Secondary malignant neoplasm of liver and intrahepatic bile duct: Secondary | ICD-10-CM | POA: Insufficient documentation

## 2018-07-20 DIAGNOSIS — Z4659 Encounter for fitting and adjustment of other gastrointestinal appliance and device: Secondary | ICD-10-CM

## 2018-07-20 DIAGNOSIS — H9190 Unspecified hearing loss, unspecified ear: Secondary | ICD-10-CM | POA: Diagnosis not present

## 2018-07-20 DIAGNOSIS — E785 Hyperlipidemia, unspecified: Secondary | ICD-10-CM | POA: Diagnosis not present

## 2018-07-20 DIAGNOSIS — Z9889 Other specified postprocedural states: Secondary | ICD-10-CM | POA: Insufficient documentation

## 2018-07-20 DIAGNOSIS — Z9689 Presence of other specified functional implants: Secondary | ICD-10-CM | POA: Diagnosis not present

## 2018-07-20 DIAGNOSIS — Z8249 Family history of ischemic heart disease and other diseases of the circulatory system: Secondary | ICD-10-CM | POA: Insufficient documentation

## 2018-07-20 DIAGNOSIS — R17 Unspecified jaundice: Secondary | ICD-10-CM | POA: Insufficient documentation

## 2018-07-20 HISTORY — DX: Personal history of urinary calculi: Z87.442

## 2018-07-20 HISTORY — PX: STENT REMOVAL: SHX6421

## 2018-07-20 HISTORY — PX: ENDOSCOPIC RETROGRADE CHOLANGIOPANCREATOGRAPHY (ERCP) WITH PROPOFOL: SHX5810

## 2018-07-20 HISTORY — PX: BILIARY STENT PLACEMENT: SHX5538

## 2018-07-20 SURGERY — ENDOSCOPIC RETROGRADE CHOLANGIOPANCREATOGRAPHY (ERCP) WITH PROPOFOL
Anesthesia: General

## 2018-07-20 MED ORDER — LIDOCAINE 2% (20 MG/ML) 5 ML SYRINGE
INTRAMUSCULAR | Status: DC | PRN
  Administered 2018-07-20: 100 mg via INTRAVENOUS

## 2018-07-20 MED ORDER — CIPROFLOXACIN IN D5W 400 MG/200ML IV SOLN
INTRAVENOUS | Status: AC
  Filled 2018-07-20: qty 200

## 2018-07-20 MED ORDER — ONDANSETRON HCL 4 MG/2ML IJ SOLN
INTRAMUSCULAR | Status: DC | PRN
  Administered 2018-07-20: 4 mg via INTRAVENOUS

## 2018-07-20 MED ORDER — CIPROFLOXACIN IN D5W 400 MG/200ML IV SOLN
INTRAVENOUS | Status: DC | PRN
  Administered 2018-07-20: 400 mg via INTRAVENOUS

## 2018-07-20 MED ORDER — ROCURONIUM BROMIDE 10 MG/ML (PF) SYRINGE
PREFILLED_SYRINGE | INTRAVENOUS | Status: DC | PRN
  Administered 2018-07-20: 50 mg via INTRAVENOUS

## 2018-07-20 MED ORDER — INDOMETHACIN 50 MG RE SUPP
RECTAL | Status: DC | PRN
  Administered 2018-07-20: 100 mg via RECTAL

## 2018-07-20 MED ORDER — DEXAMETHASONE SODIUM PHOSPHATE 10 MG/ML IJ SOLN
INTRAMUSCULAR | Status: DC | PRN
  Administered 2018-07-20: 5 mg via INTRAVENOUS

## 2018-07-20 MED ORDER — SUGAMMADEX SODIUM 200 MG/2ML IV SOLN
INTRAVENOUS | Status: DC | PRN
  Administered 2018-07-20: 200 mg via INTRAVENOUS

## 2018-07-20 MED ORDER — SODIUM CHLORIDE 0.9 % IV SOLN
INTRAVENOUS | Status: DC
  Administered 2018-07-20: 08:00:00 via INTRAVENOUS

## 2018-07-20 MED ORDER — IOPAMIDOL (ISOVUE-M 300) INJECTION 61%
INTRAMUSCULAR | Status: DC | PRN
  Administered 2018-07-20: 50 mL via INTRATHECAL

## 2018-07-20 MED ORDER — GLUCAGON HCL RDNA (DIAGNOSTIC) 1 MG IJ SOLR
INTRAMUSCULAR | Status: AC
  Filled 2018-07-20: qty 1

## 2018-07-20 MED ORDER — FENTANYL CITRATE (PF) 250 MCG/5ML IJ SOLN
INTRAMUSCULAR | Status: DC | PRN
  Administered 2018-07-20: 100 ug via INTRAVENOUS

## 2018-07-20 MED ORDER — INDOMETHACIN 50 MG RE SUPP
RECTAL | Status: AC
  Filled 2018-07-20: qty 2

## 2018-07-20 MED ORDER — PROPOFOL 10 MG/ML IV BOLUS
INTRAVENOUS | Status: DC | PRN
  Administered 2018-07-20: 200 mg via INTRAVENOUS

## 2018-07-20 MED ORDER — PHENYLEPHRINE 40 MCG/ML (10ML) SYRINGE FOR IV PUSH (FOR BLOOD PRESSURE SUPPORT)
PREFILLED_SYRINGE | INTRAVENOUS | Status: DC | PRN
  Administered 2018-07-20 (×2): 80 ug via INTRAVENOUS

## 2018-07-20 MED ORDER — IOPAMIDOL (ISOVUE-300) INJECTION 61%
INTRAVENOUS | Status: AC
  Filled 2018-07-20: qty 50

## 2018-07-20 MED ORDER — SODIUM CHLORIDE 0.9 % IV SOLN: INTRAVENOUS | Status: DC | PRN

## 2018-07-20 MED ORDER — MIDAZOLAM HCL 5 MG/5ML IJ SOLN
INTRAMUSCULAR | Status: DC | PRN
  Administered 2018-07-20: 2 mg via INTRAVENOUS

## 2018-07-20 NOTE — Transfer of Care (Signed)
Immediate Anesthesia Transfer of Care Note  Patient: Cory Kelly  Procedure(s) Performed: ENDOSCOPIC RETROGRADE CHOLANGIOPANCREATOGRAPHY (ERCP) WITH PROPOFOL (N/A ) STENT REMOVAL BILIARY STENT PLACEMENT  Patient Location: Endoscopy Unit  Anesthesia Type:General  Level of Consciousness: awake, alert  and oriented  Airway & Oxygen Therapy: Patient Spontanous Breathing and Patient connected to nasal cannula oxygen  Post-op Assessment: Report given to RN, Post -op Vital signs reviewed and stable and Patient moving all extremities X 4  Post vital signs: Reviewed and stable  Last Vitals:  Vitals Value Taken Time  BP 150/83 07/20/2018 10:19 AM  Temp 36.5 C 07/20/2018 10:19 AM  Pulse 66 07/20/2018 10:20 AM  Resp 18 07/20/2018 10:20 AM  SpO2 99 % 07/20/2018 10:20 AM  Vitals shown include unvalidated device data.  Last Pain:  Vitals:   07/20/18 1019  TempSrc: Oral  PainSc:          Complications: No apparent anesthesia complications

## 2018-07-20 NOTE — Anesthesia Preprocedure Evaluation (Addendum)
Anesthesia Evaluation  Patient identified by MRN, date of birth, ID band Patient awake    Reviewed: Allergy & Precautions, H&P , NPO status , Patient's Chart, lab work & pertinent test results  Airway Mallampati: II  TM Distance: >3 FB Neck ROM: Full    Dental no notable dental hx. (+) Partial Upper, Dental Advisory Given   Pulmonary neg pulmonary ROS,    Pulmonary exam normal breath sounds clear to auscultation       Cardiovascular hypertension, Pt. on medications  Rhythm:Regular Rate:Normal     Neuro/Psych negative neurological ROS  negative psych ROS   GI/Hepatic negative GI ROS, Neg liver ROS,   Endo/Other  Pancreatic mass  Renal/GU negative Renal ROS  negative genitourinary   Musculoskeletal   Abdominal   Peds  Hematology negative hematology ROS (+)   Anesthesia Other Findings   Reproductive/Obstetrics negative OB ROS                            Anesthesia Physical Anesthesia Plan  ASA: III  Anesthesia Plan: General   Post-op Pain Management:    Induction: Intravenous  PONV Risk Score and Plan: 3 and Ondansetron, Dexamethasone and Midazolam  Airway Management Planned: Oral ETT  Additional Equipment:   Intra-op Plan:   Post-operative Plan: Extubation in OR  Informed Consent: I have reviewed the patients History and Physical, chart, labs and discussed the procedure including the risks, benefits and alternatives for the proposed anesthesia with the patient or authorized representative who has indicated his/her understanding and acceptance.     Dental advisory given  Plan Discussed with: CRNA  Anesthesia Plan Comments:         Anesthesia Quick Evaluation

## 2018-07-20 NOTE — Discharge Instructions (Signed)
YOU HAD AN ENDOSCOPIC PROCEDURE TODAY: Refer to the procedure report and other information in the discharge instructions given to you for any specific questions about what was found during the examination. If this information does not answer your questions, please call [place] office at [phone] to clarify.   YOU SHOULD EXPECT: Some feelings of bloating in the abdomen. Passage of more gas than usual. Walking can help get rid of the air that was put into your GI tract during the procedure and reduce the bloating. If you had a lower endoscopy (such as a colonoscopy or flexible sigmoidoscopy) you may notice spotting of blood in your stool or on the toilet paper. Some abdominal soreness may be present for a day or two, also.  DIET: Your first meal following the procedure should be a light meal and then it is ok to progress to your normal diet. A half-sandwich or bowl of soup is an example of a good first meal. Heavy or fried foods are harder to digest and may make you feel nauseous or bloated. Drink plenty of fluids but you should avoid alcoholic beverages for 24 hours. If you had a esophageal dilation, please see attached instructions for diet.    ACTIVITY: Your care partner should take you home directly after the procedure. You should plan to take it easy, moving slowly for the rest of the day. You can resume normal activity the day after the procedure however YOU SHOULD NOT DRIVE, use power tools, machinery or perform tasks that involve climbing or major physical exertion for 24 hours (because of the sedation medicines used during the test).   SYMPTOMS TO REPORT IMMEDIATELY: A gastroenterologist can be reached at any hour. Please call [phone]  for any of the following symptoms:   Following upper endoscopy (EGD, EUS, ERCP, esophageal dilation) Vomiting of blood or coffee ground material  New, significant abdominal pain  New, significant chest pain or pain under the shoulder blades  Painful or  persistently difficult swallowing  New shortness of breath  Black, tarry-looking or red, bloody stools  FOLLOW UP:  If any biopsies were taken you will be contacted by phone or by letter within the next 1-3 weeks. Call [phone]  if you have not heard about the biopsies in 3 weeks.  Please also call with any specific questions about appointments or follow up tests.  

## 2018-07-20 NOTE — Anesthesia Procedure Notes (Signed)
Procedure Name: Intubation Date/Time: 07/20/2018 9:06 AM Performed by: Mariea Clonts, CRNA Pre-anesthesia Checklist: Patient identified, Emergency Drugs available, Suction available and Patient being monitored Patient Re-evaluated:Patient Re-evaluated prior to induction Oxygen Delivery Method: Circle System Utilized Preoxygenation: Pre-oxygenation with 100% oxygen Induction Type: IV induction and Cricoid Pressure applied Ventilation: Mask ventilation without difficulty Laryngoscope Size: Miller and 3 Grade View: Grade II Tube type: Oral Tube size: 8.0 mm Number of attempts: 1 Airway Equipment and Method: Stylet and Oral airway Placement Confirmation: ETT inserted through vocal cords under direct vision,  positive ETCO2 and breath sounds checked- equal and bilateral Tube secured with: Tape Dental Injury: Teeth and Oropharynx as per pre-operative assessment

## 2018-07-20 NOTE — Progress Notes (Signed)
The final read of the [place] [facility] had not been completed as of yet.  However my review of it on 1/16 in the p.m. suggested that the plastic stent has migrated downwards into the duodenum and the proximal end of it is within the metal biliary stent.  As such, the most likely etiology and reasoning for this patient's issues is that the stent has become displaced and is now below the previously noted area in the proximal portion of the stent.  I called and spoke with the patient's wife about this results and have also updated Dr. Ardis Hughs about it as well.

## 2018-07-20 NOTE — Anesthesia Postprocedure Evaluation (Signed)
Anesthesia Post Note  Patient: Cory Kelly  Procedure(s) Performed: ENDOSCOPIC RETROGRADE CHOLANGIOPANCREATOGRAPHY (ERCP) WITH PROPOFOL (N/A ) [place] PLACEMENT     Patient location during evaluation: Endoscopy Anesthesia Type: General Level of consciousness: awake and alert Pain management: pain level controlled Vital Signs Assessment: post-procedure vital signs reviewed and stable Respiratory status: spontaneous breathing, nonlabored ventilation and respiratory function stable Cardiovascular status: blood pressure returned to baseline and stable Postop Assessment: no apparent nausea or vomiting Anesthetic complications: no    Last Vitals:  Vitals:   07/20/18 1050 07/20/18 1100  BP: (!) 145/86 (!) 141/75  Pulse: (!) 53 (!) 55  Resp: 15 13  Temp:    SpO2: 96% 95%    Last Pain:  Vitals:   07/20/18 1100  TempSrc:   PainSc: 0-No pain                 Chestine Belknap,W. EDMOND

## 2018-07-20 NOTE — Op Note (Signed)
[facility] Patient Name: Cory Kelly Procedure Date : 07/20/2018 MRN: [id number] Attending MD: Milus Banister , MD Date of Birth: Apr 13, 1956 CSN: [id number] Age: 63 Admit Type: Outpatient Procedure:                ERCP Indications:              Malignant stricture of the common bile duct Providers:                Milus Banister, MD, Raynelle Bring, RN, Burtis Junes,                            RN, Tinnie Gens, Technician, Virgilio Belling. Beckner,                            CRNA Referring MD:             Illene Regulus, MD Medicines:                General Anesthesia, Cipro 400 mg IV, Indomethacin                            010 mg PR Complications:            No immediate complications. Estimated blood loss:                            None Estimated Blood Loss:     Estimated blood loss: none. Procedure:                Pre-Anesthesia Assessment:                           - Prior to the procedure, a History and Physical                            was performed, and patient medications and                            allergies were reviewed. The patient's tolerance of                            previous anesthesia was also reviewed. The risks                            and benefits of the procedure and the sedation                            options and risks were discussed with the patient.                            All questions were answered, and informed consent                            was obtained. Prior Anticoagulants: The patient has  taken no previous anticoagulant or antiplatelet                            agents. ASA Grade Assessment: III - A patient with                            severe systemic disease. After reviewing the risks                            and benefits, the patient was deemed in                            satisfactory condition to undergo the procedure.                           After obtaining informed consent, the scope  was                            passed under direct vision. Throughout the                            procedure, the patient's blood pressure, pulse, and                            oxygen saturations were monitored continuously. The                            TJF-Q180V ([id number]) Olympus duodenoscope was                            introduced through the mouth, and used to inject                            contrast into and used to inject contrast into the                            bile duct. The ERCP was accomplished without                            difficulty. The patient tolerated the procedure                            well. Scope In: Scope Out: Findings:      Scout film showed a plastic stent within a SEMS in the RUQ. The       duodenoscope was advanced to the region of the major papilla. The       previously placed plastic stent had clearly migrated distally, the most       distal 3-4cm was extending into the duodenum. There was biodebris and       sludge on the plastic stent and filling the SEMS distally. I removed the       previously placed plastic stent with a snare and then used a 44 Autotome       over a .035 hydrajag wire to cannulate the  SEMS/bile duct. Cholangiogram       showed a 5-57m stenosis just proximal to the SEMS. Contrast filled the       right hepatic duct and it was slightly dilated. Despite multiple       attempts I was unable to place the .035 wire or a .025 wire into the       left hepatic duct. Contrast never filled the left hepatic duct either. I       swept the right hepatic duct and SEMS several times with a 9-131m      biliary retrieval balloon delivering a moderate amount of biodebris into       the duodenum. There was never purulence. I then placed a 10Fr 7cm long       plastic biliary stent in good position bridging the focal stenosis       decribed above (just proximal to the SEMS). I then placed a 7Fr 7cm long       plastic biliary stent along  side the 10Fr stent. Both stents clearly       bridge the strenosis. The 10Fr stent migrated proximally a bit while I       placed the 7Fr stent but the distal tip and the phlange of the 10Fr       stent were still clearly visible endoscopically (see images). I briefly       tried to pull the 10Fr stent distally with forceps but it was clear to       me that it was not working and may only end up dislodging one or both of       the stents and so I stopped. The main pancreatic duct was never       cannulate with a wire or injected with dye. Impression:               - Previously placed plastic stent was removed.                           - There was a 5-23m16mtricture just proximal to the                            existing SEMS which I think this also involves the                            left hepatic duct (unable to fill the left heptic                            duct with dye or cannulate it with a wire).                           - Two plastic stents were placed in good position                            bridging the stricture and extending into the                            duodenum within the SEMS. Recommendation:           - Discharge patient to home (ambulatory).                           -  He knows to complete 5 days of twice daily cipro.                           - LFTs next week at [place]                           - If this does not improve his elevated liver                            tests, will have to consider that the left hepatic                            duct system may be the culprit which could require                            IR PTC. Procedure Code(s):        --- Professional ---                           [phone], Endoscopic retrograde                            cholangiopancreatography (ERCP); with removal and                            exchange of stent(s), biliary or pancreatic duct,                            including pre- and post-dilation and guide wire                             passage, when performed, including sphincterotomy,                            when performed, each stent exchanged Diagnosis Code(s):        --- Professional ---                           K83.1, Obstruction of bile duct CPT copyright 2018 American Medical Association. All rights reserved. The codes documented in this report are preliminary and upon coder review may  be revised to meet current compliance requirements. Milus Banister, MD 07/20/2018 10:29:22 AM This report has been signed electronically. Number of Addenda: 0

## 2018-07-20 NOTE — Interval H&P Note (Signed)
History and Physical Interval Note:  07/20/2018 8:12 AM  Cory Kelly  has presented today for surgery, with the diagnosis of bile duct stone, stent exchange  The various methods of treatment have been discussed with the patient and family. After consideration of risks, benefits and other options for treatment, the patient has consented to  Procedure(s): ENDOSCOPIC RETROGRADE CHOLANGIOPANCREATOGRAPHY (ERCP) WITH PROPOFOL (N/A) as a surgical intervention .  The patient's history has been reviewed, patient examined, no change in status, stable for surgery.  I have reviewed the patient's chart and labs.  Questions were answered to the patient's satisfaction.     Milus Banister

## 2018-07-20 NOTE — Telephone Encounter (Signed)
-----   Message from Milus Banister, MD sent at 07/20/2018 10:44 AM EST ----- sorry ----- Message ----- From: Timothy Lasso, RN Sent: 07/20/2018  10:41 AM EST To: Milus Banister, MD  There is no patient attached.  ----- Message ----- From: Milus Banister, MD Sent: 07/20/2018  10:31 AM EST To: Timothy Lasso, RN, Ladell Pier, MD, #  Marney Doctor, Just completed ERCP, see full report in EPIC   - Previously placed plastic stent was removed. - There was a 5-26mm stricture just proximal to the existing SEMS which I think this also involves the left hepatic duct (unable to fill the left heptic duct with dye or cannulate it with a wire). - Two plastic stents were placed in good position bridging the stricture and extending into the duodenum within the SEMS.     Miguelangel Korn, HE needs CMET mid next week. Thanks

## 2018-07-23 ENCOUNTER — Encounter: Payer: Self-pay | Admitting: *Deleted

## 2018-07-23 ENCOUNTER — Other Ambulatory Visit: Payer: Self-pay | Admitting: Nurse Practitioner

## 2018-07-23 ENCOUNTER — Telehealth: Payer: Self-pay | Admitting: Oncology

## 2018-07-23 ENCOUNTER — Inpatient Hospital Stay: Payer: BLUE CROSS/BLUE SHIELD

## 2018-07-23 ENCOUNTER — Inpatient Hospital Stay (HOSPITAL_BASED_OUTPATIENT_CLINIC_OR_DEPARTMENT_OTHER): Payer: BLUE CROSS/BLUE SHIELD | Admitting: Nurse Practitioner

## 2018-07-23 ENCOUNTER — Encounter: Payer: Self-pay | Admitting: Nurse Practitioner

## 2018-07-23 VITALS — BP 146/87 | HR 55 | Temp 98.4°F | Resp 17 | Ht 75.0 in | Wt 230.9 lb

## 2018-07-23 DIAGNOSIS — C250 Malignant neoplasm of head of pancreas: Secondary | ICD-10-CM

## 2018-07-23 DIAGNOSIS — Z955 Presence of coronary angioplasty implant and graft: Secondary | ICD-10-CM | POA: Diagnosis not present

## 2018-07-23 DIAGNOSIS — C787 Secondary malignant neoplasm of liver and intrahepatic bile duct: Secondary | ICD-10-CM

## 2018-07-23 DIAGNOSIS — R17 Unspecified jaundice: Secondary | ICD-10-CM | POA: Diagnosis not present

## 2018-07-23 DIAGNOSIS — I10 Essential (primary) hypertension: Secondary | ICD-10-CM

## 2018-07-23 DIAGNOSIS — R911 Solitary pulmonary nodule: Secondary | ICD-10-CM | POA: Diagnosis not present

## 2018-07-23 DIAGNOSIS — R200 Anesthesia of skin: Secondary | ICD-10-CM

## 2018-07-23 DIAGNOSIS — K8041 Calculus of bile duct with cholecystitis, unspecified, with obstruction: Secondary | ICD-10-CM

## 2018-07-23 DIAGNOSIS — Z95828 Presence of other vascular implants and grafts: Secondary | ICD-10-CM

## 2018-07-23 DIAGNOSIS — R748 Abnormal levels of other serum enzymes: Secondary | ICD-10-CM | POA: Diagnosis not present

## 2018-07-23 DIAGNOSIS — R202 Paresthesia of skin: Secondary | ICD-10-CM

## 2018-07-23 LAB — CMP (CANCER CENTER ONLY)
ALT: 321 U/L — AB (ref 0–44)
AST: 116 U/L — ABNORMAL HIGH (ref 15–41)
Albumin: 3.2 g/dL — ABNORMAL LOW (ref 3.5–5.0)
Alkaline Phosphatase: 656 U/L — ABNORMAL HIGH (ref 38–126)
Anion gap: 10 (ref 5–15)
BUN: 7 mg/dL — ABNORMAL LOW (ref 8–23)
CO2: 28 mmol/L (ref 22–32)
Calcium: 9 mg/dL (ref 8.9–10.3)
Chloride: 103 mmol/L (ref 98–111)
Creatinine: 0.86 mg/dL (ref 0.61–1.24)
GFR, Est AFR Am: >60 mL/min (ref >60)
GFR, Estimated: >60 mL/min (ref >60)
GLUCOSE: 220 mg/dL — AB (ref 70–99)
Potassium: 3.2 mmol/L — ABNORMAL LOW (ref 3.5–5.1)
Sodium: 141 mmol/L (ref 135–145)
Total Bilirubin: 2.5 mg/dL — ABNORMAL HIGH (ref 0.3–1.2)
Total Protein: 6.3 g/dL — ABNORMAL LOW (ref 6.5–8.1)

## 2018-07-23 MED ORDER — HEPARIN SOD (PORK) LOCK FLUSH 100 UNIT/ML IV SOLN
500.0000 [IU] | Freq: Once | INTRAVENOUS | Status: DC
  Filled 2018-07-23: qty 5

## 2018-07-23 MED ORDER — SODIUM CHLORIDE 0.9% FLUSH
10.0000 mL | Freq: Once | INTRAVENOUS | Status: AC
  Administered 2018-07-23: 10 mL
  Filled 2018-07-23: qty 10

## 2018-07-23 NOTE — Progress Notes (Signed)
Critical lab brought over by lab. ALT reported high. [name] aware.

## 2018-07-23 NOTE — Progress Notes (Signed)
@   1305 called wife to inform her labs are improved, but Dr. Benay Spice wants to delay chemo till next week.

## 2018-07-23 NOTE — Progress Notes (Addendum)
[place] OFFICE PROGRESS NOTE   Diagnosis: Pancreas cancer  INTERVAL HISTORY:   Cory Kelly returns as scheduled.  He overall is feeling better.  He notes improvement in the jaundice.  No fever or chills.  He has mild tingling in the fingertips.  He has fairly consistent numbness in the feet.  Objective:  Vital signs in last 24 hours:  Blood pressure (!) 146/87, pulse (!) 55, temperature 98.4 F (36.9 C), temperature source Oral, resp. rate 17, height '6\' 3"'$  (1.905 m), weight 230 lb 14.4 oz (104.7 kg), SpO2 100 %.    HEENT: Mild scleral icterus.  No thrush or ulcers. Resp: Lungs clear bilaterally. Cardio: Regular rate and rhythm. GI: Abdomen soft and nontender.  No hepatomegaly. Vascular: No leg edema. Port-A-Cath without erythema.   Lab Results:  Lab Results  Component Value Date   WBC 4.6 07/19/2018   HGB 12.2 (L) 07/19/2018   HCT 34.0 (L) 07/19/2018   MCV 83.1 07/19/2018   PLT 107 (L) 07/19/2018   NEUTROABS 3.5 07/19/2018    Medications: I have reviewed the patient's current medications.  Assessment/Plan: 1. Pancreas head mass  CT abdomen/pelvis 08/03/2017-fullness in the head of the pancreas with stranding in the peripancreatic fat and pancreatic ductal dilatation.   MRI abdomen 08/04/2017-findings favored to represent acute on chronic pancreatitis; equivocal soft tissue fullness within the head and uncinate process of the pancreas; indeterminate too small to characterize right hepatic lobe lesion; small volume abdominal ascites; splenomegaly.   CA-19-9 elevated at 977on 08/04/2017.  Status postupper EUS 08/10/2017-findings of an irregular masslike region in the pancreatic head measuring approximately 2.7 cm. This directly abutted the main portal vein but no other major vascular structures. Biopsies obtained with preliminary cytology positive for malignancy, likely adenocarcinoma. The final report is pending. The common bile duct and main  pancreatic duct were both dilated. ERCP was then proceeded with forstent placement. Multiple attempts were made tocannulate the bile duct without success. The procedure was aborted.  08/16/2017 ERCP with placement of a metal biliary stent in the common bile duct by Dr. Francella Solian at [facility].  Cycle 1 FOLFIRINOX 08/31/2017  Cycle 2 FOLFIRINOX 09/14/2017  Cycle 3 FOLFIRINOX 09/28/2017  Cycle 4 FOLFIRINOX 10/12/2017 (oxaliplatin dose reduced secondary to thrombocytopenia)  Cycle 5 FOLFIRINOX 10/26/2017  Cycle 6 FOLFIRINOX 11/09/2017  [facility] CTs at [facility] 11/29/2017-no definitive evidence of distant metastatic disease. 2 subcentimeter liver lesions described on prior MRI not visualized on CT. Ill-defined pancreatic head mass stable to decreased in size measuring 2.1 x 2 cm. Peripancreatic inflammatory stranding decreased from prior. No biliary ductal dilatation. Celiac axis less than 180 degrees abutment. Common hepatic artery with greater than 180 degrees encasement; superior mesenteric artery with short segment less than 180 degrees abutment; portal vein/superior mesenteric vein with greater than 180 degrees encasement of the extrahepatic portal vein with associated circumferential narrowing at the portomesenteric, overall substantially improved from prior examination. Now less than 180 degree abutment of the SMV. The portal vein and SMV remain patent. Splenic vein patent.  Cycle 1 gemcitabine/Abraxane 12/06/2017  Cycle 2 gemcitabine/Abraxane 12/20/2017  Cycle 3 gemcitabine/Abraxane 01/03/2018  Cycle 4 gemcitabine/Abraxane 01/17/2018  Cycle 5 gemcitabine 01/31/2018 (Abraxane held due to neuropathy)  Cycle 6 gemcitabine 02/14/2018 (Abraxane held due to neuropathy)  Cycle 7 gemcitabine 02/28/2018 (Abraxane held due to neuropathy)  [facility]abdomen/[facility]vis at [facility] 03/08/2018- stable appearance of previously identified infiltrating pancreatic head mass. No CT evidence of metastatic disease.  SBRT  to the pancreas 04/05/2018-04/11/2018  05/09/2018  CA-19-9 2138   MRI abdomen 05/09/2018- similar 6 mm hypoenhancing focus posterior right lobe liver; more superior lesion near the dome of the liver not identified on the postcontrast imaging but there is a persistent focus of diffusion signal abnormality in this region; new 8 mm hypoenhancing focus located just superior to the gallbladder; additional tiny focus of diffusion abnormality located more superiorly without obvious associated abnormal enhancement. Ill-defined pancreatic head mass not felt to be significantly changed.  CT chest/abdomen/pelvis 05/10/2018- ill-defined hypoattenuating pancreatic head mass and ill-defined soft tissue abutting the celiac axis with mildly increased dilatation of the main pancreatic duct. New soft tissue nodule along the right upper lobe bronchus; hypoattenuating liver lesions concerning for metastasis.  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  The stent traverses the pancreatic mass and extends into the duodenum.  Increase in size of 2 liver lesions.  Pancreatic mass appears similar.  Indistinct tissue planes around the duodenum along with wall thickening in the gallbladder and especially the gallbladder neck. 2. Hypertension 3. Port-A-Cath placement 08/30/2017 4. History of elevated bilirubin-questionGilbert's syndrome 5. Mild leukopenia, thrombocytopenia-question secondary to portal hypertension/splenomegaly;09/14/2017 white count improved, platelet count stable;progressive thrombocytopenia following gemcitabine/Abraxane 6. Kidney stones 7. Fever following  cycles 2 and 3 gemcitabine/Abraxane,? Fever related to gemcitabine 8.Jaundice, elevated liver enzymes and bilirubin 06/22/2018; status post ERCP 06/25/2018 with debris removed from the existing stent and a new plastic stent placed.  Recurrent jaundice 07/19/2018 status post ERCP 07/20/2018 with finding that the previously placed plastic stent had migrated distally.  There was debris and sludge on the stent.  The previously placed stent was removed.  5 to 8 mm stricture just proximal to the existing SEMS.  2 plastic stents were placed.   Disposition: Cory Kelly appears stable.  He is feeling better since undergoing ERCP/new stent placement 07/19/2018.  He and his wife understand the chemotherapy cannot be given until the bilirubin normalizes.  We will contact him with today's value.  We are tentatively scheduling him to begin FOLFIRINOX in 1 week.  We will see him in follow-up prior to cycle 2 in 3 weeks.  He will contact the office in the interim with any problems.  Patient seen with Dr. Benay Spice.    Ned Card ANP/GNP-BC   07/23/2018  9:35 AM  This was a shared visit with Ned Card.  The liver enzymes and bilirubin have improved following the ERCP/stent placement on 07/20/2018.  We discussed treatment options with Cory Kelly.  The plan is to resume FOLFIRINOX next week if the liver enzymes are further improved.  Julieanne Manson, MD

## 2018-07-23 NOTE — Telephone Encounter (Signed)
Gave patient avs report and appointments for January and February. Per patient appointment for tomorrow is to remain on schedule pending today's blood work - provider is to call him re blood work/tx.

## 2018-07-24 ENCOUNTER — Inpatient Hospital Stay: Payer: BLUE CROSS/BLUE SHIELD

## 2018-07-24 LAB — CULTURE, BLOOD (SINGLE)
Culture: NO GROWTH
Special Requests: ADEQUATE

## 2018-07-25 ENCOUNTER — Other Ambulatory Visit: Payer: Self-pay

## 2018-07-25 ENCOUNTER — Telehealth: Payer: Self-pay

## 2018-07-25 MED ORDER — POTASSIUM CHLORIDE CRYS ER 20 MEQ PO TBCR: 20.0000 meq | EXTENDED_RELEASE_TABLET | Freq: Every day | ORAL | 0 refills | Status: DC

## 2018-07-25 NOTE — Telephone Encounter (Signed)
Received call from pt stating that he will need order for potassium sent to his pharmacy. Order is put in.

## 2018-07-25 NOTE — Telephone Encounter (Signed)
Left voicemail for pt to return call to [facility].

## 2018-07-25 NOTE — Telephone Encounter (Signed)
TC to pt per lisa to see if pt was taking any potassium. Spoke with pts wife [phone]) and Pt is not currently taking any potassium but she stated that she thinks they have some potassium at home. She stated that she would give a call back about if he still has potassium or needs a new order called in to the  pharmacy.

## 2018-07-28 DIAGNOSIS — N390 Urinary tract infection, site not specified: Secondary | ICD-10-CM | POA: Diagnosis not present

## 2018-07-29 ENCOUNTER — Other Ambulatory Visit: Payer: Self-pay | Admitting: Oncology

## 2018-07-30 ENCOUNTER — Telehealth: Payer: Self-pay | Admitting: *Deleted

## 2018-07-30 NOTE — Telephone Encounter (Addendum)
Called to report their PCP's PA did U/A on him over the weekend and he has UTI. Was put back on Cipro twice daily for 21 days. Asking if this will interfere with his chemo on 08/01/18.  Per Dr. Benay Spice: OK to proceed with chemo as scheduled.

## 2018-07-31 ENCOUNTER — Encounter: Payer: Self-pay | Admitting: Oncology

## 2018-08-01 ENCOUNTER — Inpatient Hospital Stay: Payer: BLUE CROSS/BLUE SHIELD

## 2018-08-01 ENCOUNTER — Telehealth: Payer: Self-pay

## 2018-08-01 ENCOUNTER — Other Ambulatory Visit: Payer: Self-pay | Admitting: Oncology

## 2018-08-01 VITALS — BP 157/86 | HR 58 | Temp 97.8°F | Resp 17

## 2018-08-01 DIAGNOSIS — C250 Malignant neoplasm of head of pancreas: Secondary | ICD-10-CM

## 2018-08-01 DIAGNOSIS — C787 Secondary malignant neoplasm of liver and intrahepatic bile duct: Secondary | ICD-10-CM | POA: Diagnosis not present

## 2018-08-01 DIAGNOSIS — Z95828 Presence of other vascular implants and grafts: Secondary | ICD-10-CM

## 2018-08-01 DIAGNOSIS — R911 Solitary pulmonary nodule: Secondary | ICD-10-CM | POA: Diagnosis not present

## 2018-08-01 DIAGNOSIS — Z955 Presence of coronary angioplasty implant and graft: Secondary | ICD-10-CM | POA: Diagnosis not present

## 2018-08-01 DIAGNOSIS — R17 Unspecified jaundice: Secondary | ICD-10-CM | POA: Diagnosis not present

## 2018-08-01 DIAGNOSIS — I10 Essential (primary) hypertension: Secondary | ICD-10-CM | POA: Diagnosis not present

## 2018-08-01 DIAGNOSIS — R748 Abnormal levels of other serum enzymes: Secondary | ICD-10-CM | POA: Diagnosis not present

## 2018-08-01 LAB — CBC WITH DIFFERENTIAL (CANCER CENTER ONLY)
Abs Immature Granulocytes: 0.01 10*3/uL (ref 0.00–0.07)
Basophils Absolute: 0 10*3/uL (ref 0.0–0.1)
Basophils Relative: 1 %
Eosinophils Absolute: 0.1 10*3/uL (ref 0.0–0.5)
Eosinophils Relative: 3 %
HCT: 35.3 % — ABNORMAL LOW (ref 39.0–52.0)
Hemoglobin: 11.9 g/dL — ABNORMAL LOW (ref 13.0–17.0)
IMMATURE GRANULOCYTES: 0 %
Lymphocytes Relative: 9 %
Lymphs Abs: 0.4 10*3/uL — ABNORMAL LOW (ref 0.7–4.0)
MCH: 29.4 pg (ref 26.0–34.0)
MCHC: 33.7 g/dL (ref 30.0–36.0)
MCV: 87.2 fL (ref 80.0–100.0)
Monocytes Absolute: 0.3 10*3/uL (ref 0.1–1.0)
Monocytes Relative: 8 %
Neutro Abs: 3.2 10*3/uL (ref 1.7–7.7)
Neutrophils Relative %: 79 %
Platelet Count: 128 10*3/uL — ABNORMAL LOW (ref 150–400)
RBC: 4.05 MIL/uL — ABNORMAL LOW (ref 4.22–5.81)
RDW: 12.9 % (ref 11.5–15.5)
WBC Count: 4 10*3/uL (ref 4.0–10.5)
nRBC: 0 % (ref 0.0–0.2)

## 2018-08-01 LAB — CMP (CANCER CENTER ONLY)
ALT: 543 U/L (ref 0–44)
AST: 330 U/L (ref 15–41)
Albumin: 3.2 g/dL — ABNORMAL LOW (ref 3.5–5.0)
Alkaline Phosphatase: 1054 U/L — ABNORMAL HIGH (ref 38–126)
Anion gap: 8 (ref 5–15)
BUN: 10 mg/dL (ref 8–23)
CO2: 27 mmol/L (ref 22–32)
Calcium: 8.8 mg/dL — ABNORMAL LOW (ref 8.9–10.3)
Chloride: 105 mmol/L (ref 98–111)
Creatinine: 0.91 mg/dL (ref 0.61–1.24)
GFR, Est AFR Am: >60 mL/min (ref >60)
GFR, Estimated: >60 mL/min (ref >60)
Glucose, Bld: 189 mg/dL — ABNORMAL HIGH (ref 70–99)
Potassium: 3.6 mmol/L (ref 3.5–5.1)
SODIUM: 140 mmol/L (ref 135–145)
Total Bilirubin: 3 mg/dL — ABNORMAL HIGH (ref 0.3–1.2)
Total Protein: 6.3 g/dL — ABNORMAL LOW (ref 6.5–8.1)

## 2018-08-01 MED ORDER — HEPARIN SOD (PORK) LOCK FLUSH 100 UNIT/ML IV SOLN
500.0000 [IU] | Freq: Once | INTRAVENOUS | Status: AC | PRN
  Administered 2018-08-01: 500 [IU]
  Filled 2018-08-01: qty 5

## 2018-08-01 MED ORDER — SODIUM CHLORIDE 0.9% FLUSH
10.0000 mL | Freq: Once | INTRAVENOUS | Status: AC
  Administered 2018-08-01: 10 mL
  Filled 2018-08-01: qty 10

## 2018-08-01 MED ORDER — PALONOSETRON HCL INJECTION 0.25 MG/5ML
INTRAVENOUS | Status: AC
  Filled 2018-08-01: qty 5

## 2018-08-01 MED ORDER — SODIUM CHLORIDE 0.9% FLUSH
10.0000 mL | INTRAVENOUS | Status: DC | PRN
  Administered 2018-08-01: 10 mL
  Filled 2018-08-01: qty 10

## 2018-08-01 NOTE — Progress Notes (Signed)
Critical result from Lab Cory Kelly's AST-330 and ALT-543 Dr. Benay Spice informed. Infusion Nurse Baltazar Najjar notified no treatment today. She will inform Cory Kelly.

## 2018-08-01 NOTE — Progress Notes (Signed)
Patient and spouse aware not receiving treatment today. Copy of labs provided as requested.

## 2018-08-01 NOTE — Telephone Encounter (Signed)
TC to Pt to inform him that Dr. Benay Spice spoke with Dr. Ardis Hughs and his recommendation was for him to get a MRI / MRCP of the Liver informed Pt. Referral was already sent and to expect a call to confirm appointment time and day. Pt. Verbalized understanding. No further problems or concerns noted.

## 2018-08-03 ENCOUNTER — Inpatient Hospital Stay: Payer: BLUE CROSS/BLUE SHIELD

## 2018-08-08 ENCOUNTER — Other Ambulatory Visit: Payer: Self-pay | Admitting: Oncology

## 2018-08-08 ENCOUNTER — Ambulatory Visit (HOSPITAL_COMMUNITY)
Admission: RE | Admit: 2018-08-08 | Discharge: 2018-08-08 | Disposition: A | Discharge status: Home / Self Care | Payer: BLUE CROSS/BLUE SHIELD | Source: Ambulatory Visit | Attending: Oncology | Admitting: Oncology

## 2018-08-08 DIAGNOSIS — C250 Malignant neoplasm of head of pancreas: Secondary | ICD-10-CM

## 2018-08-08 DIAGNOSIS — R935 Abnormal findings on diagnostic imaging of other abdominal regions, including retroperitoneum: Secondary | ICD-10-CM | POA: Diagnosis not present

## 2018-08-08 DIAGNOSIS — C259 Malignant neoplasm of pancreas, unspecified: Secondary | ICD-10-CM | POA: Diagnosis not present

## 2018-08-08 MED ORDER — GADOBUTROL 1 MMOL/ML IV SOLN
10.0000 mL | Freq: Once | INTRAVENOUS | Status: AC | PRN
  Administered 2018-08-08: 10 mL via INTRAVENOUS

## 2018-08-09 ENCOUNTER — Telehealth: Payer: Self-pay | Admitting: *Deleted

## 2018-08-09 ENCOUNTER — Other Ambulatory Visit: Payer: Self-pay

## 2018-08-09 ENCOUNTER — Ambulatory Visit (INDEPENDENT_AMBULATORY_CARE_PROVIDER_SITE_OTHER)
Admission: RE | Admit: 2018-08-09 | Discharge: 2018-08-09 | Disposition: A | Discharge status: Home / Self Care | Payer: BLUE CROSS/BLUE SHIELD | Source: Ambulatory Visit | Attending: Gastroenterology | Admitting: Gastroenterology

## 2018-08-09 DIAGNOSIS — T85520A Displacement of bile duct prosthesis, initial encounter: Secondary | ICD-10-CM | POA: Diagnosis not present

## 2018-08-09 DIAGNOSIS — Z9689 Presence of other specified functional implants: Secondary | ICD-10-CM | POA: Diagnosis not present

## 2018-08-09 MED ORDER — CIPROFLOXACIN HCL 500 MG PO TABS: 500.0000 mg | ORAL_TABLET | Freq: Two times a day (BID) | ORAL | 0 refills | Status: DC

## 2018-08-09 NOTE — Telephone Encounter (Signed)
Per Dr. Benay Spice: notified patient of MRI results. As expected he has new and enlarging hepatic masses-expected since he has had not chemotherapy recently. No obvious bilary obstruction seen. Dr. Ardis Hughs and Dr. Rush Landmark are working on determining the cause of his elevated bili level. Dr. Benay Spice will get back with him when we hear something. Needs to be on Cipro 500 mg bid X 2 weeks--he reports that PCP started him on this 07/18/18 for UTI for 3 weeks, which will finish on 2/14. OK per Dr. Benay Spice. He reports feeling very bloated and "gassy" today. Frequent small loose stools that are yellow in color. Will take Imodium occasionally.

## 2018-08-10 ENCOUNTER — Other Ambulatory Visit: Payer: Self-pay

## 2018-08-10 ENCOUNTER — Telehealth: Payer: Self-pay | Admitting: Gastroenterology

## 2018-08-10 ENCOUNTER — Inpatient Hospital Stay: Payer: BLUE CROSS/BLUE SHIELD | Attending: Nurse Practitioner

## 2018-08-10 ENCOUNTER — Telehealth: Payer: Self-pay | Admitting: *Deleted

## 2018-08-10 DIAGNOSIS — R948 Abnormal results of function studies of other organs and systems: Secondary | ICD-10-CM

## 2018-08-10 DIAGNOSIS — R748 Abnormal levels of other serum enzymes: Secondary | ICD-10-CM | POA: Diagnosis not present

## 2018-08-10 DIAGNOSIS — Z5189 Encounter for other specified aftercare: Secondary | ICD-10-CM | POA: Diagnosis not present

## 2018-08-10 DIAGNOSIS — C787 Secondary malignant neoplasm of liver and intrahepatic bile duct: Secondary | ICD-10-CM | POA: Insufficient documentation

## 2018-08-10 DIAGNOSIS — T85520A Displacement of bile duct prosthesis, initial encounter: Secondary | ICD-10-CM

## 2018-08-10 DIAGNOSIS — Z5111 Encounter for antineoplastic chemotherapy: Secondary | ICD-10-CM | POA: Insufficient documentation

## 2018-08-10 DIAGNOSIS — I10 Essential (primary) hypertension: Secondary | ICD-10-CM | POA: Diagnosis not present

## 2018-08-10 DIAGNOSIS — R911 Solitary pulmonary nodule: Secondary | ICD-10-CM | POA: Diagnosis not present

## 2018-08-10 DIAGNOSIS — C250 Malignant neoplasm of head of pancreas: Secondary | ICD-10-CM | POA: Diagnosis not present

## 2018-08-10 DIAGNOSIS — C259 Malignant neoplasm of pancreas, unspecified: Secondary | ICD-10-CM

## 2018-08-10 LAB — CBC WITH DIFFERENTIAL (CANCER CENTER ONLY)
Abs Immature Granulocytes: 0.03 10*3/uL (ref 0.00–0.07)
Basophils Absolute: 0 10*3/uL (ref 0.0–0.1)
Basophils Relative: 1 %
Eosinophils Absolute: 0.2 10*3/uL (ref 0.0–0.5)
Eosinophils Relative: 4 %
HCT: 37.9 % — ABNORMAL LOW (ref 39.0–52.0)
Hemoglobin: 13 g/dL (ref 13.0–17.0)
Immature Granulocytes: 1 %
LYMPHS ABS: 0.6 10*3/uL — AB (ref 0.7–4.0)
Lymphocytes Relative: 11 %
MCH: 29.8 pg (ref 26.0–34.0)
MCHC: 34.3 g/dL (ref 30.0–36.0)
MCV: 86.9 fL (ref 80.0–100.0)
MONOS PCT: 8 %
Monocytes Absolute: 0.4 10*3/uL (ref 0.1–1.0)
Neutro Abs: 4 10*3/uL (ref 1.7–7.7)
Neutrophils Relative %: 75 %
Platelet Count: 161 10*3/uL (ref 150–400)
RBC: 4.36 MIL/uL (ref 4.22–5.81)
RDW: 12.8 % (ref 11.5–15.5)
WBC Count: 5.2 10*3/uL (ref 4.0–10.5)
nRBC: 0 % (ref 0.0–0.2)

## 2018-08-10 LAB — CMP (CANCER CENTER ONLY)
ALBUMIN: 3.4 g/dL — AB (ref 3.5–5.0)
ALT: 259 U/L — ABNORMAL HIGH (ref 0–44)
AST: 103 U/L — ABNORMAL HIGH (ref 15–41)
Alkaline Phosphatase: 984 U/L — ABNORMAL HIGH (ref 38–126)
Anion gap: 8 (ref 5–15)
BUN: 12 mg/dL (ref 8–23)
CO2: 30 mmol/L (ref 22–32)
Calcium: 9.1 mg/dL (ref 8.9–10.3)
Chloride: 102 mmol/L (ref 98–111)
Creatinine: 1.3 mg/dL — ABNORMAL HIGH (ref 0.61–1.24)
GFR, Est AFR Am: >60 mL/min (ref >60)
GFR, Estimated: 59 mL/min — ABNORMAL LOW (ref >60)
GLUCOSE: 167 mg/dL — AB (ref 70–99)
Potassium: 4 mmol/L (ref 3.5–5.1)
Sodium: 140 mmol/L (ref 135–145)
Total Bilirubin: 2.3 mg/dL — ABNORMAL HIGH (ref 0.3–1.2)
Total Protein: 6.6 g/dL (ref 6.5–8.1)

## 2018-08-10 NOTE — Telephone Encounter (Signed)
Notified of message below.  Will come today at 1330 for labs- message to scheduler

## 2018-08-10 NOTE — Telephone Encounter (Signed)
Pt wife called in and wanted doctor Ardis Hughs to giver her a call she wanted to discuss her husbands MRI

## 2018-08-10 NOTE — Telephone Encounter (Signed)
Dr Ardis Hughs will you call the pt's wife?

## 2018-08-10 NOTE — Telephone Encounter (Signed)
I spoke with her about his MRI, my plans for ERCP.  She understands that I do have hope for successfully removing his plastic stents and replacing them with probably a longer metal one.  She does understand that since they have migrated proximally they might be difficult to remove.  She has a lot of great questions and we had a nice conversation.  She knows to bring into the hospital sooner than next Thursday if he starts to have fevers.  He is on Cipro.

## 2018-08-10 NOTE — Telephone Encounter (Signed)
-----   Message from Belva Chimes, RN sent at 08/10/2018  8:20 AM EST -----  ----- Message ----- From: Ladell Pier, MD Sent: 08/09/2018   5:47 PM EST To: Belva Chimes, RN  Please have him come in for a cmet 08/10/2018 ----- Message ----- From: Irving Copas., MD Sent: 08/09/2018   7:46 AM EST To: Milus Banister, MD, Ladell Pier, MD  Difficult situation. With only the mild biliary dilation throughout, it is weird to believe that having 2 stents within the metal stent is a cause of issue currently. MRIs are difficult to interpret sometimes with our plastic stents though and I am having a hard time following or seeing them. I suspect that this is becoming a much more infiltrative process with small ducts likely causing issues but there does not look to be a particular region where PBD would be helpful either. I think a quick 2 view KUB to ensure that the double plastic stents are within the metal stent and still in good position would be helpful to [place]. Considering, 1 more ERCP to replace the stents may be reasonable as I do not see any other ductal abnormalities for IR to go after, but I agree with Linna Hoff that I'm not sure how much more we are helping him with our procedures and if we entertain a repeat ERCP and that is not effective, I think our options are limited thereafter. The hardpart with the partially covered metal stent is that the uncovered portion was near the region of the gallbladder cystic duct, so if we tried to remove the stent it may unravel.  Removing these can be a pain with need to induce tissue necrosis with other longer fully covered SEMS and then down the road trying to remove the partially covered SEMS. However, if 2 or more good plastic stents are not helping and crossing the stricture, there would not be any role for [place] to even consider what I have outlined above. I agree with antibiotics, probably get a KUB today if possible. Gabe ----- Message ----- From:  Milus Banister, MD Sent: 08/09/2018   5:45 AM EST To: Ladell Pier, MD, #  New, larger liver mets obviously not a good sign overall but it doesn't seem like enough tumor burden to account for his rising bili, unless one of the tumors is obstructing an intrahepatic bile duct.  Let me show this to Dr. Rush Landmark and we'll make a plan. Can you put him on BID cipro for now?  Thanks  Gabe, Recall Mr. Sult, I put two plastic stents through an existing SEMS about 3-4 weeks ago with initial improvement in LFTS which are now rising again. What do you think about his MR?

## 2018-08-11 LAB — CANCER ANTIGEN 19-9: CA 19-9: 12206 U/mL — ABNORMAL HIGH (ref 0–35)

## 2018-08-14 ENCOUNTER — Other Ambulatory Visit: Payer: BLUE CROSS/BLUE SHIELD

## 2018-08-14 ENCOUNTER — Ambulatory Visit: Payer: BLUE CROSS/BLUE SHIELD

## 2018-08-14 ENCOUNTER — Ambulatory Visit: Payer: BLUE CROSS/BLUE SHIELD | Admitting: Nurse Practitioner

## 2018-08-15 ENCOUNTER — Inpatient Hospital Stay (HOSPITAL_BASED_OUTPATIENT_CLINIC_OR_DEPARTMENT_OTHER): Payer: BLUE CROSS/BLUE SHIELD | Admitting: Oncology

## 2018-08-15 ENCOUNTER — Telehealth: Payer: Self-pay | Admitting: Gastroenterology

## 2018-08-15 ENCOUNTER — Telehealth: Payer: Self-pay | Admitting: Oncology

## 2018-08-15 VITALS — BP 139/82 | HR 57 | Temp 97.5°F | Resp 18 | Ht 75.0 in | Wt 228.9 lb

## 2018-08-15 DIAGNOSIS — C250 Malignant neoplasm of head of pancreas: Secondary | ICD-10-CM | POA: Diagnosis not present

## 2018-08-15 DIAGNOSIS — R748 Abnormal levels of other serum enzymes: Secondary | ICD-10-CM | POA: Diagnosis not present

## 2018-08-15 DIAGNOSIS — I10 Essential (primary) hypertension: Secondary | ICD-10-CM

## 2018-08-15 DIAGNOSIS — C787 Secondary malignant neoplasm of liver and intrahepatic bile duct: Secondary | ICD-10-CM

## 2018-08-15 DIAGNOSIS — R911 Solitary pulmonary nodule: Secondary | ICD-10-CM

## 2018-08-15 DIAGNOSIS — Z5111 Encounter for antineoplastic chemotherapy: Secondary | ICD-10-CM | POA: Diagnosis not present

## 2018-08-15 DIAGNOSIS — Z5189 Encounter for other specified aftercare: Secondary | ICD-10-CM | POA: Diagnosis not present

## 2018-08-15 NOTE — Progress Notes (Signed)
[facility] OFFICE PROGRESS NOTE   Diagnosis: Pancreas cancer  INTERVAL HISTORY:   [facility] Kelly returns as scheduled.  He denies fever and dark urine.  Constipation improved with a stool softener.  He is eating.  He is working.  Objective:  Vital signs in last 24 hours:  Blood pressure 139/82, pulse (!) 57, temperature (!) 97.5 F (36.4 C), temperature source Oral, resp. rate 18, height _0  (1.905 m), weight 228 lb 14.4 oz (103.8 kg), SpO2 99 %.    HEENT: Sclera anicteric Resp: Lungs clear bilaterally Cardio: Regular rate and rhythm GI: No hepatomegaly, nontender, no mass Vascular: Leg edema Skin: Abrasions over the extremities at sites of scratching, small ecchymoses at the left forearm  Portacath/PICC-without erythema  Lab Results:  Lab Results  Component Value Date   WBC 5.2 08/10/2018   HGB 13.0 08/10/2018   HCT 37.9 (L) 08/10/2018   MCV 86.9 08/10/2018   PLT 161 08/10/2018   NEUTROABS 4.0 08/10/2018    CMP  Lab Results  Component Value Date   NA 140 08/10/2018   K 4.0 08/10/2018   CL 102 08/10/2018   CO2 30 08/10/2018   GLUCOSE 167 (H) 08/10/2018   BUN 12 08/10/2018   CREATININE 1.30 (H) 08/10/2018   CALCIUM 9.1 08/10/2018   PROT 6.6 08/10/2018   ALBUMIN 3.4 (L) 08/10/2018   AST 103 (H) 08/10/2018   ALT 259 (H) 08/10/2018   ALKPHOS 984 (H) 08/10/2018   BILITOT 2.3 (H) 08/10/2018   GFRNONAA 59 (L) 08/10/2018   GFRAA >60 08/10/2018    No results found for: CEA1  Lab Results  Component Value Date   INR 0.90 06/24/2018    Imaging:  No results found.  Medications: I have reviewed the patient's current medications.   Assessment/Plan: 1. Pancreas head mass  CT abdomen/pelvis 08/03/2017-fullness in the head of the pancreas with stranding in the peripancreatic fat and pancreatic ductal dilatation.   MRI abdomen 08/04/2017-findings favored to represent acute on chronic pancreatitis; equivocal soft tissue fullness within the head  and uncinate process of the pancreas; indeterminate too small to characterize right hepatic lobe lesion; small volume abdominal ascites; splenomegaly.   CA-19-9 elevated at 977on 08/04/2017.  Status postupper EUS 08/10/2017-findings of an irregular masslike region in the pancreatic head measuring approximately 2.7 cm. This directly abutted the main portal vein but no other major vascular structures. Biopsies obtained with preliminary cytology positive for malignancy, likely adenocarcinoma. The final report is pending. The common bile duct and main pancreatic duct were both dilated. ERCP was then proceeded with forstent placement. Multiple attempts were made tocannulate the bile duct without success. The procedure was aborted.  08/16/2017 ERCP with placement of a metal biliary stent in the common bile duct by Dr. Francella Solian at [facility].  Cycle 1 FOLFIRINOX 08/31/2017  Cycle 2 FOLFIRINOX 09/14/2017  Cycle 3 FOLFIRINOX 09/28/2017  Cycle 4 FOLFIRINOX 10/12/2017 (oxaliplatin dose reduced secondary to thrombocytopenia)  Cycle 5 FOLFIRINOX 10/26/2017  Cycle 6 FOLFIRINOX 11/09/2017  Restaging CTs at [facility] 11/29/2017-no definitive evidence of distant metastatic disease. 2 subcentimeter liver lesions described on prior MRI not visualized on CT. Ill-defined pancreatic head mass stable to decreased in size measuring 2.1 x 2 cm. Peripancreatic inflammatory stranding decreased from prior. No biliary ductal dilatation. Celiac axis less than 180 degrees abutment. Common hepatic artery with greater than 180 degrees encasement; superior mesenteric artery with short segment less than 180 degrees abutment; portal vein/superior mesenteric vein with greater than 180 degrees encasement of  the extrahepatic portal vein with associated circumferential narrowing at the portomesenteric, overall substantially improved from prior examination. Now less than 180 degree abutment of the SMV. The portal vein and SMV remain patent.  Splenic vein patent.  Cycle 1 gemcitabine/Abraxane 12/06/2017  Cycle 2 gemcitabine/Abraxane 12/20/2017  Cycle 3 gemcitabine/Abraxane 01/03/2018  Cycle 4 gemcitabine/Abraxane 01/17/2018  Cycle 5 gemcitabine 01/31/2018 (Abraxane held due to neuropathy)  Cycle 6 gemcitabine 02/14/2018 (Abraxane held due to neuropathy)  Cycle 7 gemcitabine 02/28/2018 (Abraxane held due to neuropathy)  CT chest[facility]abdomen/pelvis at [facility] 03/08/2018- stable appearance of previously identified infiltrating pancreatic head mass. No CT evidence of metastatic disease.  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 Ill-defined pancreatic head mass not felt to be significantly changed.  CT chest/abdomen/pelvis 05/10/2018- ill-defined hypoattenuating pancreatic head mass and ill-defined soft tissue abutting the celiac axis with mildly increased dilatation of the main pancreatic duct. New soft tissue nodule along the right upper lobe bronchus; hypoattenuating liver lesions concerning for metastasis.  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  The stent traverses the pancreatic mass and extends into the duodenum.  Increase  in size of 2 liver lesions.  Pancreatic mass appears similar.  Indistinct tissue planes around the duodenum along with wall thickening in the gallbladder and especially the gallbladder neck.  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 Hypertension 3. Port-A-Cath placement 08/30/2017 4. History of elevated bilirubin-questionGilbert's syndrome 5. Mild leukopenia, thrombocytopenia-question secondary to portal hypertension/splenomegaly;09/14/2017 white count improved, platelet count stable;progressive thrombocytopenia following gemcitabine/Abraxane 6. Kidney stones 7. Fever following cycles 2 and 3 gemcitabine/Abraxane,? Fever related to gemcitabine 8.Jaundice, elevated liver enzymes and bilirubin 06/22/2018; status post ERCP 06/25/2018 with debris removed from the existing stent and a new plastic stent placed.  Recurrent jaundice 07/19/2018 status post ERCP 07/20/2018 with finding that the previously placed plastic stent had migrated distally.  There was debris and sludge on the stent.  The previously placed stent was removed.  5 to 8 mm stricture just proximal to the existing SEMS.  2 plastic stents were placed.    Disposition: [facility] Kelly appears stable.  The liver enzymes were partially improved on 08/10/2018, but remain elevated.  He is scheduled for an ERCP procedure [place] and new stents as indicated.  Discussed the progressive disease noted on the 08/08/2018 MRI.  He is scheduled to begin FOLFIRINOX chemotherapy on 08/21/2018 if the liver enzymes are further improved.  He will be seen for an office visit that day.  We will adjust the chemotherapy doses as indicated based on the chemistry panel.  25 minutes were spent with the patient today.  The majority of the time was used for counseling and  coordination of  care.  Betsy Coder, MD  08/15/2018  8:32 AM

## 2018-08-15 NOTE — Telephone Encounter (Signed)
Scheduled appt per 2/12 los.  Sent a staff message to get lisa appt added and added the treatment for 2/18 and 3/3 in the books for approval.

## 2018-08-15 NOTE — Telephone Encounter (Signed)
Left message on machine to call back  

## 2018-08-15 NOTE — H&P (View-Only) (Signed)
[facility] OFFICE PROGRESS NOTE   Diagnosis: Pancreas cancer  INTERVAL HISTORY:   Cory Kelly returns as scheduled.  He denies fever and dark urine.  Constipation improved with a stool softener.  He is eating.  He is working.  Objective:  Vital signs in last 24 hours:  Blood pressure 139/82, pulse (!) 57, temperature (!) 97.5 F (36.4 C), temperature source Oral, resp. rate 18, height 6' 3" (1.905 m), weight 228 lb 14.4 oz (103.8 kg), SpO2 99 %.    HEENT: Sclera anicteric Resp: Lungs clear bilaterally Cardio: Regular rate and rhythm GI: No hepatomegaly, nontender, no mass Vascular: Leg edema Skin: Abrasions over the extremities at sites of scratching, small ecchymoses at the left forearm  Portacath/PICC-without erythema  Lab Results:  Lab Results  Component Value Date   WBC 5.2 08/10/2018   HGB 13.0 08/10/2018   HCT 37.9 (L) 08/10/2018   MCV 86.9 08/10/2018   PLT 161 08/10/2018   NEUTROABS 4.0 08/10/2018    CMP  Lab Results  Component Value Date   NA 140 08/10/2018   K 4.0 08/10/2018   CL 102 08/10/2018   CO2 30 08/10/2018   GLUCOSE 167 (H) 08/10/2018   BUN 12 08/10/2018   CREATININE 1.30 (H) 08/10/2018   CALCIUM 9.1 08/10/2018   PROT 6.6 08/10/2018   ALBUMIN 3.4 (L) 08/10/2018   AST 103 (H) 08/10/2018   ALT 259 (H) 08/10/2018   ALKPHOS 984 (H) 08/10/2018   BILITOT 2.3 (H) 08/10/2018   GFRNONAA 59 (L) 08/10/2018   GFRAA >60 08/10/2018    No results found for: CEA1  Lab Results  Component Value Date   INR 0.90 06/24/2018    Imaging:  No results found.  Medications: I have reviewed the patient's current medications.   Assessment/Plan: 1. Pancreas head mass  CT abdomen/pelvis 08/03/2017-fullness in the head of the pancreas with stranding in the peripancreatic fat and pancreatic ductal dilatation.   MRI abdomen 08/04/2017-findings favored to represent acute on chronic pancreatitis; equivocal soft tissue fullness within the head  and uncinate process of the pancreas; indeterminate too small to characterize right hepatic lobe lesion; small volume abdominal ascites; splenomegaly.   CA-19-9 elevated at 977on 08/04/2017.  Status postupper EUS 08/10/2017-findings of an irregular masslike region in the pancreatic head measuring approximately 2.7 cm. This directly abutted the main portal vein but no other major vascular structures. Biopsies obtained with preliminary cytology positive for malignancy, likely adenocarcinoma. The final report is pending. The common bile duct and main pancreatic duct were both dilated. ERCP was then proceeded with forstent placement. Multiple attempts were made tocannulate the bile duct without success. The procedure was aborted.  08/16/2017 ERCP with placement of a metal biliary stent in the common bile duct by Dr. Jowell at [facility].  Cycle 1 FOLFIRINOX 08/31/2017  Cycle 2 FOLFIRINOX 09/14/2017  Cycle 3 FOLFIRINOX 09/28/2017  Cycle 4 FOLFIRINOX 10/12/2017 (oxaliplatin dose reduced secondary to thrombocytopenia)  Cycle 5 FOLFIRINOX 10/26/2017  Cycle 6 FOLFIRINOX 11/09/2017  Restaging CTs at [facility] 11/29/2017-no definitive evidence of distant metastatic disease. 2 subcentimeter liver lesions described on prior MRI not visualized on CT. Ill-defined pancreatic head mass stable to decreased in size measuring 2.1 x 2 cm. Peripancreatic inflammatory stranding decreased from prior. No biliary ductal dilatation. Celiac axis less than 180 degrees abutment. Common hepatic artery with greater than 180 degrees encasement; superior mesenteric artery with short segment less than 180 degrees abutment; portal vein/superior mesenteric vein with greater than 180 degrees encasement of   the extrahepatic portal vein with associated circumferential narrowing at the portomesenteric, overall substantially improved from prior examination. Now less than 180 degree abutment of the SMV. The portal vein and SMV remain patent.  Splenic vein patent.  Cycle 1 gemcitabine/Abraxane 12/06/2017  Cycle 2 gemcitabine/Abraxane 12/20/2017  Cycle 3 gemcitabine/Abraxane 01/03/2018  Cycle 4 gemcitabine/Abraxane 01/17/2018  Cycle 5 gemcitabine 01/31/2018 (Abraxane held due to neuropathy)  Cycle 6 gemcitabine 02/14/2018 (Abraxane held due to neuropathy)  Cycle 7 gemcitabine 02/28/2018 (Abraxane held due to neuropathy)  CT chest/abdomen/pelvis at [facility] 03/08/2018- stable appearance of previously identified infiltrating pancreatic head mass. No CT evidence of metastatic disease.  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 Ill-defined pancreatic head mass not felt to be significantly changed.  CT chest/abdomen/pelvis 05/10/2018- ill-defined hypoattenuating pancreatic head mass and ill-defined soft tissue abutting the celiac axis with mildly increased dilatation of the main pancreatic duct. New soft tissue nodule along the right upper lobe bronchus; hypoattenuating liver lesions concerning for metastasis.  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  The stent traverses the pancreatic mass and extends into the duodenum.  Increase  in size of 2 liver lesions.  Pancreatic mass appears similar.  Indistinct tissue planes around the duodenum along with wall thickening in the gallbladder and especially the gallbladder neck.  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 Hypertension 3. Port-A-Cath placement 08/30/2017 4. History of elevated bilirubin-questionGilbert's syndrome 5. Mild leukopenia, thrombocytopenia-question secondary to portal hypertension/splenomegaly;09/14/2017 white count improved, platelet count stable;progressive thrombocytopenia following gemcitabine/Abraxane 6. Kidney stones 7. Fever following cycles 2 and 3 gemcitabine/Abraxane,? Fever related to gemcitabine 8.Jaundice, elevated liver enzymes and bilirubin 06/22/2018; status post ERCP 06/25/2018 with debris removed from the existing stent and a new plastic stent placed.  Recurrent jaundice 07/19/2018 status post ERCP 07/20/2018 with finding that the previously placed plastic stent had migrated distally.  There was debris and sludge on the stent.  The previously placed stent was removed.  5 to 8 mm stricture just proximal to the existing SEMS.  2 plastic stents were placed.    Disposition: Cory Kelly appears stable.  The liver enzymes were partially improved on 08/10/2018, but remain elevated.  He is scheduled for an ERCP procedure [place] and new stents as indicated.  Discussed the progressive disease noted on the 08/08/2018 MRI.  He is scheduled to begin FOLFIRINOX chemotherapy on 08/21/2018 if the liver enzymes are further improved.  He will be seen for an office visit that day.  We will adjust the chemotherapy doses as indicated based on the chemistry panel.  25 minutes were spent with the patient today.  The majority of the time was used for counseling and   coordination of  care.  Defne Gerling, MD  08/15/2018  8:32 AM   

## 2018-08-15 NOTE — Telephone Encounter (Signed)
Pt scheduled for surgery tomorrow.  Pt is returning your call.

## 2018-08-16 ENCOUNTER — Other Ambulatory Visit: Payer: Self-pay

## 2018-08-16 ENCOUNTER — Encounter (HOSPITAL_COMMUNITY): Payer: Self-pay | Admitting: *Deleted

## 2018-08-16 ENCOUNTER — Ambulatory Visit (HOSPITAL_COMMUNITY): Payer: BLUE CROSS/BLUE SHIELD | Admitting: Anesthesiology

## 2018-08-16 ENCOUNTER — Encounter (HOSPITAL_COMMUNITY)
Admission: RE | Disposition: A | Discharge status: Home / Self Care | Payer: Self-pay | Source: Home / Self Care | Attending: Gastroenterology

## 2018-08-16 ENCOUNTER — Ambulatory Visit (HOSPITAL_COMMUNITY)
Admission: RE | Admit: 2018-08-16 | Discharge: 2018-08-16 | Disposition: A | Discharge status: Home / Self Care | Payer: BLUE CROSS/BLUE SHIELD | Attending: Gastroenterology | Admitting: Gastroenterology

## 2018-08-16 ENCOUNTER — Ambulatory Visit (HOSPITAL_COMMUNITY): Payer: BLUE CROSS/BLUE SHIELD

## 2018-08-16 DIAGNOSIS — R17 Unspecified jaundice: Secondary | ICD-10-CM | POA: Diagnosis not present

## 2018-08-16 DIAGNOSIS — I10 Essential (primary) hypertension: Secondary | ICD-10-CM | POA: Diagnosis not present

## 2018-08-16 DIAGNOSIS — R509 Fever, unspecified: Secondary | ICD-10-CM | POA: Insufficient documentation

## 2018-08-16 DIAGNOSIS — C259 Malignant neoplasm of pancreas, unspecified: Secondary | ICD-10-CM

## 2018-08-16 DIAGNOSIS — R948 Abnormal results of function studies of other organs and systems: Secondary | ICD-10-CM

## 2018-08-16 DIAGNOSIS — Z4659 Encounter for fitting and adjustment of other gastrointestinal appliance and device: Secondary | ICD-10-CM | POA: Insufficient documentation

## 2018-08-16 DIAGNOSIS — C250 Malignant neoplasm of head of pancreas: Secondary | ICD-10-CM | POA: Diagnosis not present

## 2018-08-16 DIAGNOSIS — K838 Other specified diseases of biliary tract: Secondary | ICD-10-CM | POA: Diagnosis not present

## 2018-08-16 DIAGNOSIS — T85520A Displacement of bile duct prosthesis, initial encounter: Secondary | ICD-10-CM

## 2018-08-16 DIAGNOSIS — G629 Polyneuropathy, unspecified: Secondary | ICD-10-CM | POA: Diagnosis not present

## 2018-08-16 DIAGNOSIS — C787 Secondary malignant neoplasm of liver and intrahepatic bile duct: Secondary | ICD-10-CM

## 2018-08-16 DIAGNOSIS — D696 Thrombocytopenia, unspecified: Secondary | ICD-10-CM | POA: Diagnosis not present

## 2018-08-16 DIAGNOSIS — K831 Obstruction of bile duct: Secondary | ICD-10-CM | POA: Insufficient documentation

## 2018-08-16 DIAGNOSIS — R161 Splenomegaly, not elsewhere classified: Secondary | ICD-10-CM | POA: Diagnosis not present

## 2018-08-16 DIAGNOSIS — E785 Hyperlipidemia, unspecified: Secondary | ICD-10-CM | POA: Diagnosis not present

## 2018-08-16 HISTORY — PX: STENT REMOVAL: SHX6421

## 2018-08-16 HISTORY — PX: BILIARY STENT PLACEMENT: SHX5538

## 2018-08-16 HISTORY — PX: ENDOSCOPIC RETROGRADE CHOLANGIOPANCREATOGRAPHY (ERCP) WITH PROPOFOL: SHX5810

## 2018-08-16 LAB — COMPREHENSIVE METABOLIC PANEL
ALT: 247 U/L — ABNORMAL HIGH (ref 0–44)
AST: 123 U/L — AB (ref 15–41)
Albumin: 3.9 g/dL (ref 3.5–5.0)
Alkaline Phosphatase: 893 U/L — ABNORMAL HIGH (ref 38–126)
Anion gap: 8 (ref 5–15)
BUN: 11 mg/dL (ref 8–23)
CO2: 26 mmol/L (ref 22–32)
Calcium: 8.9 mg/dL (ref 8.9–10.3)
Chloride: 104 mmol/L (ref 98–111)
Creatinine, Ser: 0.78 mg/dL (ref 0.61–1.24)
GFR calc Af Amer: >60 mL/min (ref >60)
GFR calc non Af Amer: >60 mL/min (ref >60)
Glucose, Bld: 130 mg/dL — ABNORMAL HIGH (ref 70–99)
Potassium: 4.3 mmol/L (ref 3.5–5.1)
Sodium: 138 mmol/L (ref 135–145)
Total Bilirubin: 3.2 mg/dL — ABNORMAL HIGH (ref 0.3–1.2)
Total Protein: 7.1 g/dL (ref 6.5–8.1)

## 2018-08-16 SURGERY — ENDOSCOPIC RETROGRADE CHOLANGIOPANCREATOGRAPHY (ERCP) WITH PROPOFOL
Anesthesia: General

## 2018-08-16 MED ORDER — CIPROFLOXACIN IN D5W 400 MG/200ML IV SOLN
INTRAVENOUS | Status: DC | PRN
  Administered 2018-08-16: 400 mg via INTRAVENOUS

## 2018-08-16 MED ORDER — MIDAZOLAM HCL 2 MG/2ML IJ SOLN
INTRAMUSCULAR | Status: AC
  Filled 2018-08-16: qty 2

## 2018-08-16 MED ORDER — SODIUM CHLORIDE 0.9 % IV SOLN: INTRAVENOUS | Status: DC

## 2018-08-16 MED ORDER — INDOMETHACIN 50 MG RE SUPP
RECTAL | Status: AC
  Filled 2018-08-16: qty 2

## 2018-08-16 MED ORDER — SODIUM CHLORIDE 0.9 % IV SOLN
INTRAVENOUS | Status: DC | PRN
  Administered 2018-08-16: 40 mL

## 2018-08-16 MED ORDER — PROPOFOL 10 MG/ML IV BOLUS
INTRAVENOUS | Status: DC | PRN
  Administered 2018-08-16: 200 mg via INTRAVENOUS

## 2018-08-16 MED ORDER — MIDAZOLAM HCL 5 MG/5ML IJ SOLN
INTRAMUSCULAR | Status: DC | PRN
  Administered 2018-08-16: 2 mg via INTRAVENOUS

## 2018-08-16 MED ORDER — LACTATED RINGERS IV SOLN
INTRAVENOUS | Status: DC
  Administered 2018-08-16 (×2): via INTRAVENOUS

## 2018-08-16 MED ORDER — GLUCAGON HCL RDNA (DIAGNOSTIC) 1 MG IJ SOLR
INTRAMUSCULAR | Status: AC
  Filled 2018-08-16: qty 1

## 2018-08-16 MED ORDER — LIDOCAINE 2% (20 MG/ML) 5 ML SYRINGE
INTRAMUSCULAR | Status: DC | PRN
  Administered 2018-08-16: 50 mg via INTRAVENOUS

## 2018-08-16 MED ORDER — FENTANYL CITRATE (PF) 100 MCG/2ML IJ SOLN
INTRAMUSCULAR | Status: AC
  Filled 2018-08-16: qty 2

## 2018-08-16 MED ORDER — PROPOFOL 10 MG/ML IV BOLUS
INTRAVENOUS | Status: AC
  Filled 2018-08-16: qty 20

## 2018-08-16 MED ORDER — INDOMETHACIN 50 MG RE SUPP
RECTAL | Status: DC | PRN
  Administered 2018-08-16: 100 mg via RECTAL

## 2018-08-16 MED ORDER — ONDANSETRON HCL 4 MG/2ML IJ SOLN
INTRAMUSCULAR | Status: DC | PRN
  Administered 2018-08-16: 4 mg via INTRAVENOUS

## 2018-08-16 MED ORDER — ROCURONIUM BROMIDE 10 MG/ML (PF) SYRINGE
PREFILLED_SYRINGE | INTRAVENOUS | Status: DC | PRN
  Administered 2018-08-16 (×2): 10 mg via INTRAVENOUS
  Administered 2018-08-16: 40 mg via INTRAVENOUS

## 2018-08-16 MED ORDER — CIPROFLOXACIN IN D5W 400 MG/200ML IV SOLN
INTRAVENOUS | Status: AC
  Filled 2018-08-16: qty 200

## 2018-08-16 MED ORDER — DEXAMETHASONE SODIUM PHOSPHATE 4 MG/ML IJ SOLN
INTRAMUSCULAR | Status: DC | PRN
  Administered 2018-08-16: 10 mg via INTRAVENOUS

## 2018-08-16 MED ORDER — SUGAMMADEX SODIUM 200 MG/2ML IV SOLN
INTRAVENOUS | Status: DC | PRN
  Administered 2018-08-16: 300 mg via INTRAVENOUS

## 2018-08-16 MED ORDER — FENTANYL CITRATE (PF) 100 MCG/2ML IJ SOLN
INTRAMUSCULAR | Status: DC | PRN
  Administered 2018-08-16 (×2): 50 ug via INTRAVENOUS

## 2018-08-16 NOTE — Progress Notes (Signed)
Skin tear left hand noted after patient repositioned form prone to supine following ERCP,area cleaned and dressing applied.Patient and wife made aware of wound.

## 2018-08-16 NOTE — Telephone Encounter (Signed)
The pt procedure was completed today

## 2018-08-16 NOTE — Op Note (Signed)
[facility] Patient Name: Cory Kelly Procedure Date: 08/16/2018 MRN: [id number] Attending MD: Milus Banister , MD Date of Birth: 04/06/1956 CSN: [id number] Age: 63 Admit Type: Outpatient Procedure:                ERCP Indications:              Malignant stricture of the common bile duct Providers:                Milus Banister, MD, Cleda Daub, RN, Tinnie Gens, Technician, Anne Fu CRNA, CRNA,                            Dellie Catholic Referring MD:             Illene Regulus, MD Medicines:                General Anesthesia, Cipro 400 mg IV, Indomethacin                            035 mg PR Complications:            No immediate complications. Estimated blood loss:                            None Estimated Blood Loss:     Estimated blood loss: none. Procedure:                Pre-Anesthesia Assessment:                           - Prior to the procedure, a History and Physical                            was performed, and patient medications and                            allergies were reviewed. The patient's tolerance of                            previous anesthesia was also reviewed. The risks                            and benefits of the procedure and the sedation                            options and risks were discussed with the patient.                            All questions were answered, and informed consent                            was obtained. Prior Anticoagulants: The patient has  taken no previous anticoagulant or antiplatelet                            agents. ASA Grade Assessment: II - A patient with                            mild systemic disease. After reviewing the risks                            and benefits, the patient was deemed in                            satisfactory condition to undergo the procedure.                           After obtaining informed consent, the scope  was                            passed under direct vision. Throughout the                            procedure, the patient's blood pressure, pulse, and                            oxygen saturations were monitored continuously. The                            TJF-Q180V ([id number]) Olympus duodenoscope was                            introduced through the mouth, and used to inject                            contrast into and used to inject contrast into the                            bile duct. The ERCP was accomplished without                            difficulty. The patient tolerated the procedure                            well. Scope In: Scope Out: Findings:      Scout film revealed two plastic stents within one biliary (4cm long)       SEMS in the RUQ. The duodenoscope was advanced to the major papilla. One       of the plastic stents was extending slightly distal to the SEMS and it       was removed with a snare. Fluoroscopy showed that the other plastic       stent was buried within the SEMS with it's distal tip about 2cm inward.       I used a 9-53mm biliary retrieval balloon over a .035 hydrawire to pull       the other plastic stent more  distally so that it's distal tip extended       past the end of the SEMS and then I removed it with a snare. I then       interrogated the biliary tree with cholangiogram through a 44 [employer]       over the .035 hydrawire. Cholangiogram revealed an incomplete, 1-2cm       long stricture just proximal to the SEMS. Also the left hepatic duct       appeared to be dilated (to 7-55mm) while the right hepatic duct system       was less dilated (4-55mm). There was some obvious tissue ingrowth in the       SEMS as well as biodebris buildup. I cleaned the biodebris from the SEMS       as best that I could with several passes of the 9-31mm biliary retrieval       balloon and then placed a 36mm diameter 8cm long uncovered SEMS. The       proximal end of the  new SEMS lays in the left hepatic duct and the       distal end lays about 2-47mm past the tip of the previous SEMS (in the       duodenum). The new SEMS bridges the stricture well. There was delivery       of dark bile through the stents and into the duodenum following       placement of the new SEMS. The main pancreatic duct was never cannulated       with a wire or injected with dye. Impression:               - The two plastic biliary stents were removed from                            within the previously placed partially covered SEMS.                           - A stricture was confirmed just proximal to the                            previously placed SEMS and was treated with                            placement of an 8cm long 60mm diameter uncovered                            SEMS in good position. Moderate Sedation:      Not Applicable - Patient had care per Anesthesia. Recommendation:           - Discharge patient to home (ambulatory).                           - Follow liver tests. Hopefully able to resume full                            chemotherarpy very soon. Procedure Code(s):        --- Professional ---  [id number], Endoscopic retrograde                            cholangiopancreatography (ERCP); with removal and                            exchange of stent(s), biliary or pancreatic duct,                            including pre- and post-dilation and guide wire                            passage, when performed, including sphincterotomy,                            when performed, each stent exchanged Diagnosis Code(s):        --- Professional ---                           Z46.59, Encounter for fitting and adjustment of                            other gastrointestinal appliance and device                           K83.1, Obstruction of bile duct CPT copyright 2018 [employer]. All rights reserved. The codes documented in this report  are preliminary and upon coder review may  be revised to meet current compliance requirements. Milus Banister, MD 08/16/2018 12:39:55 PM This report has been signed electronically. Number of Addenda: 0

## 2018-08-16 NOTE — Transfer of Care (Signed)
Immediate Anesthesia Transfer of Care Note  Patient: Cory Kelly  Procedure(s) Performed: ENDOSCOPIC RETROGRADE CHOLANGIOPANCREATOGRAPHY (ERCP) WITH PROPOFOL (N/A )  Patient Location: PACU  Anesthesia Type:General  Level of Consciousness: awake, alert , oriented and patient cooperative  Airway & Oxygen Therapy: Patient Spontanous Breathing and Patient connected to face mask  Post-op Assessment: Report given to RN and Post -op Vital signs reviewed and stable  Post vital signs: Reviewed and stable  Last Vitals:  Vitals Value Taken Time  BP    Temp    Pulse 67 08/16/2018 12:37 PM  Resp    SpO2 100 % 08/16/2018 12:37 PM  Vitals shown include unvalidated device data.  Last Pain:  Vitals:   08/16/18 0950  TempSrc: Oral  PainSc: 0-No pain         Complications: No apparent anesthesia complications

## 2018-08-16 NOTE — Anesthesia Procedure Notes (Signed)
Procedure Name: Intubation Date/Time: 08/16/2018 11:25 AM Performed by: Claudia Desanctis, CRNA Pre-anesthesia Checklist: Patient identified, Emergency Drugs available, Suction available and Patient being monitored Patient Re-evaluated:Patient Re-evaluated prior to induction Oxygen Delivery Method: Circle system utilized Preoxygenation: Pre-oxygenation with 100% oxygen Induction Type: IV induction Ventilation: Mask ventilation without difficulty Laryngoscope Size: 2 and Miller Grade View: Grade I Tube type: Oral Number of attempts: 1 Airway Equipment and Method: Stylet Placement Confirmation: ETT inserted through vocal cords under direct vision,  positive ETCO2 and breath sounds checked- equal and bilateral Secured at: 21 cm Tube secured with: Tape Dental Injury: Teeth and Oropharynx as per pre-operative assessment

## 2018-08-16 NOTE — Anesthesia Preprocedure Evaluation (Signed)
Anesthesia Evaluation  Patient identified by MRN, date of birth, ID band Patient awake    Reviewed: Allergy & Precautions, H&P , NPO status , Patient's Chart, lab work & pertinent test results  Airway Mallampati: II  TM Distance: >3 FB Neck ROM: Full    Dental no notable dental hx. (+) Partial Upper, Dental Advisory Given   Pulmonary neg pulmonary ROS,    Pulmonary exam normal breath sounds clear to auscultation       Cardiovascular hypertension, Pt. on medications  Rhythm:Regular Rate:Normal     Neuro/Psych negative neurological ROS  negative psych ROS   GI/Hepatic negative GI ROS, Neg liver ROS,   Endo/Other  Pancreatic mass  Renal/GU negative Renal ROS  negative genitourinary   Musculoskeletal   Abdominal   Peds  Hematology negative hematology ROS (+)   Anesthesia Other Findings   Reproductive/Obstetrics negative OB ROS                             Anesthesia Physical  Anesthesia Plan  ASA: III  Anesthesia Plan: General   Post-op Pain Management:    Induction: Intravenous  PONV Risk Score and Plan: 3 and Ondansetron, Dexamethasone and Midazolam  Airway Management Planned: Oral ETT  Additional Equipment: None  Intra-op Plan:   Post-operative Plan: Extubation in OR  Informed Consent: I have reviewed the patients History and Physical, chart, labs and discussed the procedure including the risks, benefits and alternatives for the proposed anesthesia with the patient or authorized representative who has indicated his/her understanding and acceptance.     Dental advisory given  Plan Discussed with: CRNA  Anesthesia Plan Comments:         Anesthesia Quick Evaluation

## 2018-08-16 NOTE — Interval H&P Note (Signed)
History and Physical Interval Note:  08/16/2018 10:28 AM  [name]  has presented today for surgery, with the diagnosis of hx of pancreatic cancer, hx of bile duct obstruction, biliary stent  The various methods of treatment have been discussed with the patient and family. After consideration of risks, benefits and other options for treatment, the patient has consented to  Procedure(s): ENDOSCOPIC RETROGRADE CHOLANGIOPANCREATOGRAPHY (ERCP) WITH PROPOFOL (N/A) as a surgical intervention .  The patient's history has been reviewed, patient examined, no change in status, stable for surgery.  I have reviewed the patient's chart and labs.  Questions were answered to the patient's satisfaction.     Milus Banister

## 2018-08-16 NOTE — Discharge Instructions (Signed)
YOU HAD AN ENDOSCOPIC PROCEDURE TODAY: Refer to the procedure report and other information in the discharge instructions given to you for any specific questions about what was found during the examination. If this information does not answer your questions, please call [place] office at [phone] to clarify.  ° °YOU SHOULD EXPECT: Some feelings of bloating in the abdomen. Passage of more gas than usual. Walking can help get rid of the air that was put into your GI tract during the procedure and reduce the bloating. If you had a lower endoscopy (such as a colonoscopy or flexible sigmoidoscopy) you may notice spotting of blood in your stool or on the toilet paper. Some abdominal soreness may be present for a day or two, also. ° °DIET: Your first meal following the procedure should be a light meal and then it is ok to progress to your normal diet. A half-sandwich or bowl of soup is an example of a good first meal. Heavy or fried foods are harder to digest and may make you feel nauseous or bloated. Drink plenty of fluids but you should avoid alcoholic beverages for 24 hours. If you had a esophageal dilation, please see attached instructions for diet.   ° °ACTIVITY: Your care partner should take you home directly after the procedure. You should plan to take it easy, moving slowly for the rest of the day. You can resume normal activity the day after the procedure however YOU SHOULD NOT DRIVE, use power tools, machinery or perform tasks that involve climbing or major physical exertion for 24 hours (because of the sedation medicines used during the test).  ° °SYMPTOMS TO REPORT IMMEDIATELY: °A gastroenterologist can be reached at any hour. Please call [phone]  for any of the following symptoms:  °Following lower endoscopy (colonoscopy, flexible sigmoidoscopy) °Excessive amounts of blood in the stool  °Significant tenderness, worsening of abdominal pains  °Swelling of the abdomen that is new, acute  °Fever of 100° or  higher  °Following upper endoscopy (EGD, EUS, ERCP, esophageal dilation) °Vomiting of blood or coffee ground material  °New, significant abdominal pain  °New, significant chest pain or pain under the shoulder blades  °Painful or persistently difficult swallowing  °New shortness of breath  °Black, tarry-looking or red, bloody stools ° °FOLLOW UP:  °If any biopsies were taken you will be contacted by phone or by letter within the next 1-3 weeks. Call [phone]  if you have not heard about the biopsies in 3 weeks.  °Please also call with any specific questions about appointments or follow up tests. ° °

## 2018-08-17 ENCOUNTER — Telehealth: Payer: Self-pay | Admitting: Nurse Practitioner

## 2018-08-17 ENCOUNTER — Encounter (HOSPITAL_COMMUNITY): Payer: Self-pay | Admitting: Gastroenterology

## 2018-08-17 NOTE — Telephone Encounter (Signed)
R/s appt per 2/14 sch message with appt date and time

## 2018-08-18 NOTE — Anesthesia Postprocedure Evaluation (Signed)
Anesthesia Post Note  Patient: Cory Kelly  Procedure(s) Performed: ENDOSCOPIC RETROGRADE CHOLANGIOPANCREATOGRAPHY (ERCP) WITH PROPOFOL (N/A ) STENT REMOVAL BILIARY STENT PLACEMENT (N/A ) BILIARY DILITATION (N/A )     Patient location during evaluation: PACU Anesthesia Type: General Level of consciousness: sedated and patient cooperative Pain management: pain level controlled Vital Signs Assessment: post-procedure vital signs reviewed and stable Respiratory status: spontaneous breathing Cardiovascular status: stable Anesthetic complications: no    Last Vitals:  Vitals:   08/16/18 1250 08/16/18 1300  BP: (!) 169/89 (!) 171/88  Pulse: 61 (!) 57  Resp: 12 (!) 7  Temp:    SpO2: 97% 98%    Last Pain:  Vitals:   08/16/18 1300  TempSrc:   PainSc: 0-No pain   Pain Goal:                   Nolon Nations

## 2018-08-19 ENCOUNTER — Other Ambulatory Visit: Payer: Self-pay | Admitting: Oncology

## 2018-08-20 ENCOUNTER — Inpatient Hospital Stay (HOSPITAL_BASED_OUTPATIENT_CLINIC_OR_DEPARTMENT_OTHER): Payer: BLUE CROSS/BLUE SHIELD | Admitting: Nurse Practitioner

## 2018-08-20 ENCOUNTER — Inpatient Hospital Stay: Payer: BLUE CROSS/BLUE SHIELD

## 2018-08-20 ENCOUNTER — Telehealth: Payer: Self-pay | Admitting: Nurse Practitioner

## 2018-08-20 ENCOUNTER — Encounter: Payer: Self-pay | Admitting: Nurse Practitioner

## 2018-08-20 VITALS — BP 145/85 | HR 58 | Temp 98.5°F | Resp 19 | Ht 75.0 in | Wt 223.6 lb

## 2018-08-20 DIAGNOSIS — C787 Secondary malignant neoplasm of liver and intrahepatic bile duct: Secondary | ICD-10-CM | POA: Diagnosis not present

## 2018-08-20 DIAGNOSIS — C250 Malignant neoplasm of head of pancreas: Secondary | ICD-10-CM

## 2018-08-20 DIAGNOSIS — K5900 Constipation, unspecified: Secondary | ICD-10-CM

## 2018-08-20 DIAGNOSIS — I10 Essential (primary) hypertension: Secondary | ICD-10-CM | POA: Diagnosis not present

## 2018-08-20 DIAGNOSIS — Z5189 Encounter for other specified aftercare: Secondary | ICD-10-CM | POA: Diagnosis not present

## 2018-08-20 DIAGNOSIS — Z5111 Encounter for antineoplastic chemotherapy: Secondary | ICD-10-CM | POA: Diagnosis not present

## 2018-08-20 DIAGNOSIS — Z95828 Presence of other vascular implants and grafts: Secondary | ICD-10-CM

## 2018-08-20 DIAGNOSIS — R911 Solitary pulmonary nodule: Secondary | ICD-10-CM | POA: Diagnosis not present

## 2018-08-20 DIAGNOSIS — R748 Abnormal levels of other serum enzymes: Secondary | ICD-10-CM | POA: Diagnosis not present

## 2018-08-20 LAB — CBC WITH DIFFERENTIAL (CANCER CENTER ONLY)
ABS IMMATURE GRANULOCYTES: 0.01 10*3/uL (ref 0.00–0.07)
Basophils Absolute: 0 10*3/uL (ref 0.0–0.1)
Basophils Relative: 1 %
Eosinophils Absolute: 0.1 10*3/uL (ref 0.0–0.5)
Eosinophils Relative: 2 %
HCT: 35.6 % — ABNORMAL LOW (ref 39.0–52.0)
Hemoglobin: 12.6 g/dL — ABNORMAL LOW (ref 13.0–17.0)
Immature Granulocytes: 0 %
LYMPHS ABS: 0.6 10*3/uL — AB (ref 0.7–4.0)
Lymphocytes Relative: 10 %
MCH: 29.4 pg (ref 26.0–34.0)
MCHC: 35.4 g/dL (ref 30.0–36.0)
MCV: 83.2 fL (ref 80.0–100.0)
Monocytes Absolute: 0.5 10*3/uL (ref 0.1–1.0)
Monocytes Relative: 9 %
NEUTROS ABS: 4.1 10*3/uL (ref 1.7–7.7)
Neutrophils Relative %: 78 %
Platelet Count: 153 10*3/uL (ref 150–400)
RBC: 4.28 MIL/uL (ref 4.22–5.81)
RDW: 12.7 % (ref 11.5–15.5)
WBC Count: 5.3 10*3/uL (ref 4.0–10.5)
nRBC: 0 % (ref 0.0–0.2)

## 2018-08-20 LAB — CMP (CANCER CENTER ONLY)
ALT: 189 U/L — ABNORMAL HIGH (ref 0–44)
AST: 90 U/L — AB (ref 15–41)
Albumin: 3.4 g/dL — ABNORMAL LOW (ref 3.5–5.0)
Alkaline Phosphatase: 852 U/L — ABNORMAL HIGH (ref 38–126)
Anion gap: 9 (ref 5–15)
BUN: 9 mg/dL (ref 8–23)
CO2: 27 mmol/L (ref 22–32)
Calcium: 8.8 mg/dL — ABNORMAL LOW (ref 8.9–10.3)
Chloride: 103 mmol/L (ref 98–111)
Creatinine: 0.85 mg/dL (ref 0.61–1.24)
GFR, Est AFR Am: >60 mL/min (ref >60)
GFR, Estimated: >60 mL/min (ref >60)
Glucose, Bld: 173 mg/dL — ABNORMAL HIGH (ref 70–99)
Potassium: 3.3 mmol/L — ABNORMAL LOW (ref 3.5–5.1)
Sodium: 139 mmol/L (ref 135–145)
TOTAL PROTEIN: 6.5 g/dL (ref 6.5–8.1)
Total Bilirubin: 1.9 mg/dL — ABNORMAL HIGH (ref 0.3–1.2)

## 2018-08-20 MED ORDER — SODIUM CHLORIDE 0.9% FLUSH
10.0000 mL | Freq: Once | INTRAVENOUS | Status: AC
  Administered 2018-08-20: 10 mL
  Filled 2018-08-20: qty 10

## 2018-08-20 MED ORDER — LIDOCAINE-PRILOCAINE 2.5-2.5 % EX CREA: 1.0000 "application " | TOPICAL_CREAM | CUTANEOUS | 2 refills | Status: AC | PRN

## 2018-08-20 MED ORDER — POTASSIUM CHLORIDE CRYS ER 20 MEQ PO TBCR: 20.0000 meq | EXTENDED_RELEASE_TABLET | Freq: Every day | ORAL | 0 refills | Status: DC

## 2018-08-20 MED ORDER — HEPARIN SOD (PORK) LOCK FLUSH 100 UNIT/ML IV SOLN
500.0000 [IU] | Freq: Once | INTRAVENOUS | Status: AC
  Administered 2018-08-20: 500 [IU]
  Filled 2018-08-20: qty 5

## 2018-08-20 NOTE — Progress Notes (Addendum)
[facility] OFFICE PROGRESS NOTE   Diagnosis: Pancreas cancer  INTERVAL HISTORY:   Cory Kelly returns as scheduled.  He reports that overall he is not feeling well.  He feels "drained".  No fever.  He notes abdominal bloating.  He feels like he needs to have a bowel movement.  He has tried Colace, Metamucil and MiraLAX.  He notes itching in various areas.  Objective:  Vital signs in last 24 hours:  Blood pressure (!) 145/85, pulse (!) 58, temperature 98.5 F (36.9 C), temperature source Oral, resp. rate 19, height '6\' 3"'$  (1.905 m), weight 223 lb 9.6 oz (101.4 kg), SpO2 100 %.    HEENT: No thrush or ulcers. Resp: Lungs clear bilaterally. Cardio: Regular rate and rhythm. GI: Abdomen is mildly distended, question ascites.  No hepatomegaly. Vascular: No leg edema.  Lab Results:  Lab Results  Component Value Date   WBC 5.3 08/20/2018   HGB 12.6 (L) 08/20/2018   HCT 35.6 (L) 08/20/2018   MCV 83.2 08/20/2018   PLT 153 08/20/2018   NEUTROABS 4.1 08/20/2018    Imaging:  No results found.  Medications: I have reviewed the patient's current medications.  Assessment/Plan: 1. Pancreas head mass  CT abdomen/pelvis 08/03/2017-fullness in the head of the pancreas with stranding in the peripancreatic fat and pancreatic ductal dilatation.   MRI abdomen 08/04/2017-findings favored to represent acute on chronic pancreatitis; equivocal soft tissue fullness within the head and uncinate process of the pancreas; indeterminate too small to characterize right hepatic lobe lesion; small volume abdominal ascites; splenomegaly.   CA-19-9 elevated at 977on 08/04/2017.  Status postupper EUS 08/10/2017-findings of an irregular masslike region in the pancreatic head measuring approximately 2.7 cm. This directly abutted the main portal vein but no other major vascular structures. Biopsies obtained with preliminary cytology positive for malignancy, likely adenocarcinoma. The final  report is pending. The common bile duct and main pancreatic duct were both dilated. ERCP was then proceeded with forstent placement. Multiple attempts were made tocannulate the bile duct without success. The procedure was aborted.  08/16/2017 ERCP with placement of a metal biliary stent in the common bile duct by Dr. Francella Solian at [facility].  Cycle 1 FOLFIRINOX 08/31/2017  Cycle 2 FOLFIRINOX 09/14/2017  Cycle 3 FOLFIRINOX 09/28/2017  Cycle 4 FOLFIRINOX 10/12/2017 (oxaliplatin dose reduced secondary to thrombocytopenia)  Cycle 5 FOLFIRINOX 10/26/2017  Cycle 6 FOLFIRINOX 11/09/2017  Restaging CTs at [facility] 11/29/2017-no definitive evidence of distant metastatic disease. 2 subcentimeter liver lesions described on prior MRI not visualized on CT. Ill-defined pancreatic head mass stable to decreased in size measuring 2.1 x 2 cm. Peripancreatic inflammatory stranding decreased from prior. No biliary ductal dilatation. Celiac axis less than 180 degrees abutment. Common hepatic artery with greater than 180 degrees encasement; superior mesenteric artery with short segment less than 180 degrees abutment; portal vein/superior mesenteric vein with greater than 180 degrees encasement of the extrahepatic portal vein with associated circumferential narrowing at the portomesenteric, overall substantially improved from prior examination. Now less than 180 degree abutment of the SMV. The portal vein and SMV remain patent. Splenic vein patent.  Cycle 1 gemcitabine/Abraxane 12/06/2017  Cycle 2 gemcitabine/Abraxane 12/20/2017  Cycle 3 gemcitabine/Abraxane 01/03/2018  Cycle 4 gemcitabine/Abraxane 01/17/2018  Cycle 5 gemcitabine 01/31/2018 (Abraxane held due to neuropathy)  Cycle 6 gemcitabine 02/14/2018 (Abraxane held due to neuropathy)  Cycle 7 gemcitabine 02/28/2018 (Abraxane held due to neuropathy)  CT chest/abdomen/pelvis at [facility] 03/08/2018- stable appearance of previously identified infiltrating pancreatic head  mass. No CT  evidence of metastatic disease.  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 Ill-defined pancreatic head mass not felt to be significantly changed.  CT chest/abdomen/pelvis 05/10/2018- ill-defined hypoattenuating pancreatic head mass and ill-defined soft tissue abutting the celiac axis with mildly increased dilatation of the main pancreatic duct. New soft tissue nodule along the right upper lobe bronchus; hypoattenuating liver lesions concerning for metastasis.  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 The stent traverses the pancreatic mass and extends into the duodenum. Increase in size of 2 liver lesions. Pancreatic mass appears similar. Indistinct tissue planes around the duodenum along with wall thickening in the gallbladder and especially the gallbladder neck.  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 2. Hypertension 3. Port-A-Cath placement 08/30/2017 4. History of elevated bilirubin-questionGilbert's syndrome 5. Mild leukopenia, thrombocytopenia-question secondary to portal hypertension/splenomegaly;09/14/2017 white count improved, platelet count stable;progressive thrombocytopenia following gemcitabine/Abraxane 6. Kidney stones 7. Fever following cycles 2 and 3 gemcitabine/Abraxane,? Fever related to gemcitabine 8.Jaundice, elevated liver enzymes and bilirubin 06/22/2018; status post ERCP 06/25/2018 with debris removed from the existing stent and a new plastic stent placed.  Recurrent jaundice 07/19/2018 status post ERCP 07/20/2018 with finding that the previously placed plastic stent had migrated distally.  There was debris and sludge on the stent.  The previously placed stent was removed.  5 to 8 mm stricture just proximal to the existing SEMS.  2 plastic stents were placed. ERCP 08/16/2018- 2 plastic biliary stents were removed from within the previously placed partially covered SEMS; stricture confirmed just proximal to the previously placed SEMS, treated with placement of an 8 cm long 10 mm diameter uncovered SEMS in good position.   Disposition: Mr. Vandenberghe appears stable.  He underwent an ERCP last week with placement of a new stent.  We reviewed the chemistry panel from today.  LFTs are better.  The plan is to proceed with cycle 1 FOLFIRINOX tomorrow as scheduled.  He will receive Udenyca on the day of pump discontinuation.  For the bloating/constipation symptoms he will begin MiraLAX daily.  He will return for lab, follow-up and cycle 2 FOLFIRINOX in 2 weeks.  He will contact the office in the interim with any problems.  Patient seen with Dr. Benay Spice.    Ned Card ANP/GNP-BC   08/20/2018  1:12  PM  This was a shared visit with Ned Card.  Mr. Gales was interviewed and examined.  The liver enzymes  are improved following the recent stent placement.  The plan is to begin FOLFIRI [facility] tomorrow.  He will return for an office visit and cycle 2 on 09/04/2018.  Julieanne Manson, MD

## 2018-08-20 NOTE — Telephone Encounter (Signed)
Scheduled appt per 2/17 los - pt to get an updated schedule next visit.   

## 2018-08-21 ENCOUNTER — Telehealth: Payer: Self-pay | Admitting: Hematology and Oncology

## 2018-08-21 ENCOUNTER — Other Ambulatory Visit: Payer: BLUE CROSS/BLUE SHIELD

## 2018-08-21 ENCOUNTER — Inpatient Hospital Stay: Payer: BLUE CROSS/BLUE SHIELD

## 2018-08-21 ENCOUNTER — Other Ambulatory Visit: Payer: Self-pay | Admitting: Hematology and Oncology

## 2018-08-21 ENCOUNTER — Ambulatory Visit: Payer: BLUE CROSS/BLUE SHIELD | Admitting: Nurse Practitioner

## 2018-08-21 ENCOUNTER — Telehealth: Payer: Self-pay | Admitting: Oncology

## 2018-08-21 VITALS — BP 133/84 | HR 59 | Temp 97.8°F | Resp 20

## 2018-08-21 DIAGNOSIS — R911 Solitary pulmonary nodule: Secondary | ICD-10-CM | POA: Diagnosis not present

## 2018-08-21 DIAGNOSIS — Z5111 Encounter for antineoplastic chemotherapy: Secondary | ICD-10-CM | POA: Diagnosis not present

## 2018-08-21 DIAGNOSIS — C250 Malignant neoplasm of head of pancreas: Secondary | ICD-10-CM | POA: Diagnosis not present

## 2018-08-21 DIAGNOSIS — R748 Abnormal levels of other serum enzymes: Secondary | ICD-10-CM | POA: Diagnosis not present

## 2018-08-21 DIAGNOSIS — C787 Secondary malignant neoplasm of liver and intrahepatic bile duct: Secondary | ICD-10-CM | POA: Diagnosis not present

## 2018-08-21 DIAGNOSIS — I10 Essential (primary) hypertension: Secondary | ICD-10-CM | POA: Diagnosis not present

## 2018-08-21 DIAGNOSIS — Z5189 Encounter for other specified aftercare: Secondary | ICD-10-CM | POA: Diagnosis not present

## 2018-08-21 LAB — CANCER ANTIGEN 19-9: CA 19-9: 17809 U/mL — ABNORMAL HIGH (ref 0–35)

## 2018-08-21 MED ORDER — DEXTROSE 5 % IV SOLN
Freq: Once | INTRAVENOUS | Status: AC
  Administered 2018-08-21: 11:00:00 via INTRAVENOUS
  Filled 2018-08-21: qty 250

## 2018-08-21 MED ORDER — SODIUM CHLORIDE 0.9 % IV SOLN
Freq: Once | INTRAVENOUS | Status: AC
  Administered 2018-08-21: 11:00:00 via INTRAVENOUS
  Filled 2018-08-21: qty 5

## 2018-08-21 MED ORDER — OXALIPLATIN CHEMO INJECTION 100 MG/20ML
65.0000 mg/m2 | Freq: Once | INTRAVENOUS | Status: AC
  Administered 2018-08-21: 150 mg via INTRAVENOUS
  Filled 2018-08-21: qty 10

## 2018-08-21 MED ORDER — IRINOTECAN HCL CHEMO INJECTION 100 MG/5ML
112.5000 mg/m2 | Freq: Once | INTRAVENOUS | Status: AC
  Administered 2018-08-21: 260 mg via INTRAVENOUS
  Filled 2018-08-21: qty 13

## 2018-08-21 MED ORDER — PALONOSETRON HCL INJECTION 0.25 MG/5ML
0.2500 mg | Freq: Once | INTRAVENOUS | Status: AC
  Administered 2018-08-21: 0.25 mg via INTRAVENOUS

## 2018-08-21 MED ORDER — SODIUM CHLORIDE 0.9 % IV SOLN
1800.0000 mg/m2 | INTRAVENOUS | Status: DC
  Administered 2018-08-21: 4200 mg via INTRAVENOUS
  Filled 2018-08-21: qty 84

## 2018-08-21 MED ORDER — ATROPINE SULFATE 1 MG/ML IJ SOLN: 0.5000 mg | Freq: Once | INTRAMUSCULAR | Status: DC | PRN

## 2018-08-21 MED ORDER — LEUCOVORIN CALCIUM INJECTION 350 MG
300.0000 mg/m2 | Freq: Once | INTRAVENOUS | Status: AC
  Administered 2018-08-21: 696 mg via INTRAVENOUS
  Filled 2018-08-21: qty 34.8

## 2018-08-21 MED ORDER — PALONOSETRON HCL INJECTION 0.25 MG/5ML
INTRAVENOUS | Status: AC
  Filled 2018-08-21: qty 5

## 2018-08-21 MED ORDER — ATROPINE SULFATE 0.4 MG/ML IJ SOLN
INTRAMUSCULAR | Status: AC
  Filled 2018-08-21: qty 1

## 2018-08-21 NOTE — Telephone Encounter (Signed)
I have reviewed documentation in the chart, his blood work and review chemotherapy dosing with pharmacist. Compared to 2019, the patient will be receiving reduced dose treatment. All his liver enzymes are trending down since his recent interventional procedure to correct the problem with the stent.  I have informed the nursing staff to proceed with chemotherapy as prescribed today

## 2018-08-21 NOTE — Telephone Encounter (Signed)
Called patient and informed patient of their treatment being added for 3/2.  Patient aware of appt date and time.

## 2018-08-21 NOTE — Progress Notes (Signed)
Per Dr. Gorsuch: okay to proceed with treatment today with current labs. 

## 2018-08-21 NOTE — Patient Instructions (Signed)
[place] Discharge Instructions for Patients Receiving Chemotherapy  Today you received the following chemotherapy agents Oxaliplatin (ELOXATIN), Leucovorin, Irinotecan (CAMPTOSAR) & Fluorouracil (ADRUCIL).  To help prevent nausea and vomiting after your treatment, we encourage you to take your nausea medication as prescribed.   If you develop nausea and vomiting that is not controlled by your nausea medication, call the clinic.   BELOW ARE SYMPTOMS THAT SHOULD BE REPORTED IMMEDIATELY:  *FEVER GREATER THAN 100.5 F  *CHILLS WITH OR WITHOUT FEVER  NAUSEA AND VOMITING THAT IS NOT CONTROLLED WITH YOUR NAUSEA MEDICATION  *UNUSUAL SHORTNESS OF BREATH  *UNUSUAL BRUISING OR BLEEDING  TENDERNESS IN MOUTH AND THROAT WITH OR WITHOUT PRESENCE OF ULCERS  *URINARY PROBLEMS  *BOWEL PROBLEMS  UNUSUAL RASH Items with * indicate a potential emergency and should be followed up as soon as possible.  Feel free to call the clinic should you have any questions or concerns. The clinic phone number is [phone].  Please show the [place] at check-in to the Emergency Department and triage nurse.  Oxaliplatin Injection What is this medicine? OXALIPLATIN (ox AL i PLA tin) is a chemotherapy drug. It targets fast dividing cells, like cancer cells, and causes these cells to die. This medicine is used to treat cancers of the colon and rectum, and many other cancers. This medicine may be used for other purposes; ask your health care provider or pharmacist if you have questions. COMMON BRAND NAME(S): Eloxatin What should I tell my health care provider before I take this medicine? They need to know if you have any of these conditions: -kidney disease -an unusual or allergic reaction to oxaliplatin, other chemotherapy, other medicines, foods, dyes, or preservatives -pregnant or trying to get pregnant -breast-feeding How should I use this medicine? This drug is given as an  infusion into a vein. It is administered in a hospital or clinic by a specially trained health care professional. Talk to your pediatrician regarding the use of this medicine in children. Special care may be needed. Overdosage: If you think you have taken too much of this medicine contact a poison control center or emergency room at once. NOTE: This medicine is only for you. Do not share this medicine with others. What if I miss a dose? It is important not to miss a dose. Call your doctor or health care professional if you are unable to keep an appointment. What may interact with this medicine? -medicines to increase blood counts like filgrastim, pegfilgrastim, sargramostim -probenecid -some antibiotics like amikacin, gentamicin, neomycin, polymyxin B, streptomycin, tobramycin -zalcitabine Talk to your doctor or health care professional before taking any of these medicines: -acetaminophen -aspirin -ibuprofen -ketoprofen -naproxen This list may not describe all possible interactions. Give your health care provider a list of all the medicines, herbs, non-prescription drugs, or dietary supplements you use. Also tell them if you smoke, drink alcohol, or use illegal drugs. Some items may interact with your medicine. What should I watch for while using this medicine? Your condition will be monitored carefully while you are receiving this medicine. You will need important blood work done while you are taking this medicine. This medicine can make you more sensitive to cold. Do not drink cold drinks or use ice. Cover exposed skin before coming in contact with cold temperatures or cold objects. When out in cold weather wear warm clothing and cover your mouth and nose to warm the air that goes into your lungs. Tell your doctor if you get  sensitive to the cold. This drug may make you feel generally unwell. This is not uncommon, as chemotherapy can affect healthy cells as well as cancer cells. Report any  side effects. Continue your course of treatment even though you feel ill unless your doctor tells you to stop. In some cases, you may be given additional medicines to help with side effects. Follow all directions for their use. Call your doctor or health care professional for advice if you get a fever, chills or sore throat, or other symptoms of a cold or flu. Do not treat yourself. This drug decreases your body's ability to fight infections. Try to avoid being around people who are sick. This medicine may increase your risk to bruise or bleed. Call your doctor or health care professional if you notice any unusual bleeding. Be careful brushing and flossing your teeth or using a toothpick because you may get an infection or bleed more easily. If you have any dental work done, tell your dentist you are receiving this medicine. Avoid taking products that contain aspirin, acetaminophen, ibuprofen, naproxen, or ketoprofen unless instructed by your doctor. These medicines may hide a fever. Do not become pregnant while taking this medicine. Women should inform their doctor if they wish to become pregnant or think they might be pregnant. There is a potential for serious side effects to an unborn child. Talk to your health care professional or pharmacist for more information. Do not breast-feed an infant while taking this medicine. Call your doctor or health care professional if you get diarrhea. Do not treat yourself. What side effects may I notice from receiving this medicine? Side effects that you should report to your doctor or health care professional as soon as possible: -allergic reactions like skin rash, itching or hives, swelling of the face, lips, or tongue -low blood counts - This drug may decrease the number of white blood cells, red blood cells and platelets. You may be at increased risk for infections and bleeding. -signs of infection - fever or chills, cough, sore throat, pain or difficulty passing  urine -signs of decreased platelets or bleeding - bruising, pinpoint red spots on the skin, black, tarry stools, nosebleeds -signs of decreased red blood cells - unusually weak or tired, fainting spells, lightheadedness -breathing problems -chest pain, pressure -cough -diarrhea -jaw tightness -mouth sores -nausea and vomiting -pain, swelling, redness or irritation at the injection site -pain, tingling, numbness in the hands or feet -problems with balance, talking, walking -redness, blistering, peeling or loosening of the skin, including inside the mouth -trouble passing urine or change in the amount of urine Side effects that usually do not require medical attention (report to your doctor or health care professional if they continue or are bothersome): -changes in vision -constipation -hair loss -loss of appetite -metallic taste in the mouth or changes in taste -stomach pain This list may not describe all possible side effects. Call your doctor for medical advice about side effects. You may report side effects to FDA at 1-800-FDA-1088. Where should I keep my medicine? This drug is given in a hospital or clinic and will not be stored at home. NOTE: This sheet is a summary. It may not cover all possible information. If you have questions about this medicine, talk to your doctor, pharmacist, or health care provider.  2019 Elsevier/Gold Standard (2008-01-15 17:22:47)  Leucovorin injection What is this medicine? LEUCOVORIN (loo koe VOR in) is used to prevent or treat the harmful effects of some medicines. This medicine  is used to treat anemia caused by a low amount of folic acid in the body. It is also used with 5-fluorouracil (5-FU) to treat colon cancer. This medicine may be used for other purposes; ask your health care provider or pharmacist if you have questions. What should I tell my health care provider before I take this medicine? They need to know if you have any of these  conditions: -anemia from low levels of vitamin B-12 in the blood -an unusual or allergic reaction to leucovorin, folic acid, other medicines, foods, dyes, or preservatives -pregnant or trying to get pregnant -breast-feeding How should I use this medicine? This medicine is for injection into a muscle or into a vein. It is given by a health care professional in a hospital or clinic setting. Talk to your pediatrician regarding the use of this medicine in children. Special care may be needed. Overdosage: If you think you have taken too much of this medicine contact a poison control center or emergency room at once. NOTE: This medicine is only for you. Do not share this medicine with others. What if I miss a dose? This does not apply. What may interact with this medicine? -capecitabine -fluorouracil -phenobarbital -phenytoin -primidone -trimethoprim-sulfamethoxazole This list may not describe all possible interactions. Give your health care provider a list of all the medicines, herbs, non-prescription drugs, or dietary supplements you use. Also tell them if you smoke, drink alcohol, or use illegal drugs. Some items may interact with your medicine. What should I watch for while using this medicine? Your condition will be monitored carefully while you are receiving this medicine. This medicine may increase the side effects of 5-fluorouracil, 5-FU. Tell your doctor or health care professional if you have diarrhea or mouth sores that do not get better or that get worse. What side effects may I notice from receiving this medicine? Side effects that you should report to your doctor or health care professional as soon as possible: -allergic reactions like skin rash, itching or hives, swelling of the face, lips, or tongue -breathing problems -fever, infection -mouth sores -unusual bleeding or bruising -unusually weak or tired Side effects that usually do not require medical attention (report to  your doctor or health care professional if they continue or are bothersome): -constipation or diarrhea -loss of appetite -nausea, vomiting This list may not describe all possible side effects. Call your doctor for medical advice about side effects. You may report side effects to FDA at 1-800-FDA-1088. Where should I keep my medicine? This drug is given in a hospital or clinic and will not be stored at home. NOTE: This sheet is a summary. It may not cover all possible information. If you have questions about this medicine, talk to your doctor, pharmacist, or health care provider.  2019 Elsevier/Gold Standard (2007-12-25 16:50:29)  Irinotecan injection What is this medicine? IRINOTECAN (ir in oh TEE kan ) is a chemotherapy drug. It is used to treat colon and rectal cancer. This medicine may be used for other purposes; ask your health care provider or pharmacist if you have questions. COMMON BRAND NAME(S): Camptosar What should I tell my health care provider before I take this medicine? They need to know if you have any of these conditions: -dehydration -diarrhea -infection (especially a virus infection such as chickenpox, cold sores, or herpes) -liver disease -low blood counts, like low white cell, platelet, or red cell counts -low levels of calcium, magnesium, or potassium in the blood -recent or ongoing radiation therapy -an  unusual or allergic reaction to irinotecan, other medicines, foods, dyes, or preservatives -pregnant or trying to get pregnant -breast-feeding How should I use this medicine? This drug is given as an infusion into a vein. It is administered in a hospital or clinic by a specially trained health care professional. Talk to your pediatrician regarding the use of this medicine in children. Special care may be needed. Overdosage: If you think you have taken too much of this medicine contact a poison control center or emergency room at once. NOTE: This medicine is only  for you. Do not share this medicine with others. What if I miss a dose? It is important not to miss your dose. Call your doctor or health care professional if you are unable to keep an appointment. What may interact with this medicine? This medicine may interact with the following medications: -antiviral medicines for HIV or AIDS -certain antibiotics like rifampin or rifabutin -certain medicines for fungal infections like itraconazole, ketoconazole, posaconazole, and voriconazole -certain medicines for seizures like carbamazepine, phenobarbital, phenotoin -clarithromycin -gemfibrozil -nefazodone -St. John's Wort This list may not describe all possible interactions. Give your health care provider a list of all the medicines, herbs, non-prescription drugs, or dietary supplements you use. Also tell them if you smoke, drink alcohol, or use illegal drugs. Some items may interact with your medicine. What should I watch for while using this medicine? Your condition will be monitored carefully while you are receiving this medicine. You will need important blood work done while you are taking this medicine. This drug may make you feel generally unwell. This is not uncommon, as chemotherapy can affect healthy cells as well as cancer cells. Report any side effects. Continue your course of treatment even though you feel ill unless your doctor tells you to stop. In some cases, you may be given additional medicines to help with side effects. Follow all directions for their use. You may get drowsy or dizzy. Do not drive, use machinery, or do anything that needs mental alertness until you know how this medicine affects you. Do not stand or sit up quickly, especially if you are an older patient. This reduces the risk of dizzy or fainting spells. Call your doctor or health care professional for advice if you get a fever, chills or sore throat, or other symptoms of a cold or flu. Do not treat yourself. This drug  decreases your body's ability to fight infections. Try to avoid being around people who are sick. This medicine may increase your risk to bruise or bleed. Call your doctor or health care professional if you notice any unusual bleeding. Be careful brushing and flossing your teeth or using a toothpick because you may get an infection or bleed more easily. If you have any dental work done, tell your dentist you are receiving this medicine. Avoid taking products that contain aspirin, acetaminophen, ibuprofen, naproxen, or ketoprofen unless instructed by your doctor. These medicines may hide a fever. Do not become pregnant while taking this medicine. Women should inform their doctor if they wish to become pregnant or think they might be pregnant. There is a potential for serious side effects to an unborn child. Talk to your health care professional or pharmacist for more information. Do not breast-feed an infant while taking this medicine. What side effects may I notice from receiving this medicine? Side effects that you should report to your doctor or health care professional as soon as possible: -allergic reactions like skin rash, itching or hives, swelling  of the face, lips, or tongue -chest pain -diarrhea -flushing, runny nose, sweating during infusion -low blood counts - this medicine may decrease the number of white blood cells, red blood cells and platelets. You may be at increased risk for infections and bleeding. -nausea, vomiting -pain, swelling, warmth in the leg -signs of decreased platelets or bleeding - bruising, pinpoint red spots on the skin, black, tarry stools, blood in the urine -signs of infection - fever or chills, cough, sore throat, pain or difficulty passing urine -signs of decreased red blood cells - unusually weak or tired, fainting spells, lightheadedness Side effects that usually do not require medical attention (report to your doctor or health care professional if they  continue or are bothersome): -constipation -hair loss -headache -loss of appetite -mouth sores -stomach pain This list may not describe all possible side effects. Call your doctor for medical advice about side effects. You may report side effects to FDA at 1-800-FDA-1088. Where should I keep my medicine? This drug is given in a hospital or clinic and will not be stored at home. NOTE: This sheet is a summary. It may not cover all possible information. If you have questions about this medicine, talk to your doctor, pharmacist, or health care provider.  2019 Elsevier/Gold Standard (2017-09-05 15:02:58)  Fluorouracil, 5-FU injection What is this medicine? FLUOROURACIL, 5-FU (flure oh YOOR a sil) is a chemotherapy drug. It slows the growth of cancer cells. This medicine is used to treat many types of cancer like breast cancer, colon or rectal cancer, pancreatic cancer, and stomach cancer. This medicine may be used for other purposes; ask your health care provider or pharmacist if you have questions. COMMON BRAND NAME(S): Adrucil What should I tell my health care provider before I take this medicine? They need to know if you have any of these conditions: -blood disorders -dihydropyrimidine dehydrogenase (DPD) deficiency -infection (especially a virus infection such as chickenpox, cold sores, or herpes) -kidney disease -liver disease -malnourished, poor nutrition -recent or ongoing radiation therapy -an unusual or allergic reaction to fluorouracil, other chemotherapy, other medicines, foods, dyes, or preservatives -pregnant or trying to get pregnant -breast-feeding How should I use this medicine? This drug is given as an infusion or injection into a vein. It is administered in a hospital or clinic by a specially trained health care professional. Talk to your pediatrician regarding the use of this medicine in children. Special care may be needed. Overdosage: If you think you have taken too  much of this medicine contact a poison control center or emergency room at once. NOTE: This medicine is only for you. Do not share this medicine with others. What if I miss a dose? It is important not to miss your dose. Call your doctor or health care professional if you are unable to keep an appointment. What may interact with this medicine? -allopurinol -cimetidine -dapsone -digoxin -hydroxyurea -leucovorin -levamisole -medicines for seizures like ethotoin, fosphenytoin, phenytoin -medicines to increase blood counts like filgrastim, pegfilgrastim, sargramostim -medicines that treat or prevent blood clots like warfarin, enoxaparin, and dalteparin -methotrexate -metronidazole -pyrimethamine -some other chemotherapy drugs like busulfan, cisplatin, estramustine, vinblastine -trimethoprim -trimetrexate -vaccines Talk to your doctor or health care professional before taking any of these medicines: -acetaminophen -aspirin -ibuprofen -ketoprofen -naproxen This list may not describe all possible interactions. Give your health care provider a list of all the medicines, herbs, non-prescription drugs, or dietary supplements you use. Also tell them if you smoke, drink alcohol, or use illegal drugs. Some items  may interact with your medicine. What should I watch for while using this medicine? Visit your doctor for checks on your progress. This drug may make you feel generally unwell. This is not uncommon, as chemotherapy can affect healthy cells as well as cancer cells. Report any side effects. Continue your course of treatment even though you feel ill unless your doctor tells you to stop. In some cases, you may be given additional medicines to help with side effects. Follow all directions for their use. Call your doctor or health care professional for advice if you get a fever, chills or sore throat, or other symptoms of a cold or flu. Do not treat yourself. This drug decreases your body's  ability to fight infections. Try to avoid being around people who are sick. This medicine may increase your risk to bruise or bleed. Call your doctor or health care professional if you notice any unusual bleeding. Be careful brushing and flossing your teeth or using a toothpick because you may get an infection or bleed more easily. If you have any dental work done, tell your dentist you are receiving this medicine. Avoid taking products that contain aspirin, acetaminophen, ibuprofen, naproxen, or ketoprofen unless instructed by your doctor. These medicines may hide a fever. Do not become pregnant while taking this medicine. Women should inform their doctor if they wish to become pregnant or think they might be pregnant. There is a potential for serious side effects to an unborn child. Talk to your health care professional or pharmacist for more information. Do not breast-feed an infant while taking this medicine. Men should inform their doctor if they wish to father a child. This medicine may lower sperm counts. Do not treat diarrhea with over the counter products. Contact your doctor if you have diarrhea that lasts more than 2 days or if it is severe and watery. This medicine can make you more sensitive to the sun. Keep out of the sun. If you cannot avoid being in the sun, wear protective clothing and use sunscreen. Do not use sun lamps or tanning beds/booths. What side effects may I notice from receiving this medicine? Side effects that you should report to your doctor or health care professional as soon as possible: -allergic reactions like skin rash, itching or hives, swelling of the face, lips, or tongue -low blood counts - this medicine may decrease the number of white blood cells, red blood cells and platelets. You may be at increased risk for infections and bleeding. -signs of infection - fever or chills, cough, sore throat, pain or difficulty passing urine -signs of decreased platelets or  bleeding - bruising, pinpoint red spots on the skin, black, tarry stools, blood in the urine -signs of decreased red blood cells - unusually weak or tired, fainting spells, lightheadedness -breathing problems -changes in vision -chest pain -mouth sores -nausea and vomiting -pain, swelling, redness at site where injected -pain, tingling, numbness in the hands or feet -redness, swelling, or sores on hands or feet -stomach pain -unusual bleeding Side effects that usually do not require medical attention (report to your doctor or health care professional if they continue or are bothersome): -changes in finger or toe nails -diarrhea -dry or itchy skin -hair loss -headache -loss of appetite -sensitivity of eyes to the light -stomach upset -unusually teary eyes This list may not describe all possible side effects. Call your doctor for medical advice about side effects. You may report side effects to FDA at 1-800-FDA-1088. Where should I keep  my medicine? This drug is given in a hospital or clinic and will not be stored at home. NOTE: This sheet is a summary. It may not cover all possible information. If you have questions about this medicine, talk to your doctor, pharmacist, or health care provider.  2019 Elsevier/Gold Standard (2007-10-24 13:53:16)

## 2018-08-22 ENCOUNTER — Encounter: Payer: Self-pay | Admitting: *Deleted

## 2018-08-22 ENCOUNTER — Telehealth: Payer: Self-pay | Admitting: *Deleted

## 2018-08-22 MED ORDER — LOPERAMIDE HCL 2 MG PO TABS: 2.0000 mg | ORAL_TABLET | ORAL | 0 refills | Status: AC

## 2018-08-22 NOTE — Telephone Encounter (Addendum)
Chemo f/u call: FOLFIRINOX #1 on 08/21/18. Left VM to call with status or any question he may have. Informed I will be sending specific directions for taking Imodium AD on MyChart. Patient called back and reports he is having no diarrhea or nausea. Feels fine and is going to his basketball game at 6:30 pm tonight. Not sure if he should eat before the game. Encouraged him to eat something, maybe chicken breast or [place] and potato or rice. He states his wife is bring Imodium to the game for him to have on hand in case any diarrhea starts. He knows to call with any problems or questions.

## 2018-08-23 ENCOUNTER — Inpatient Hospital Stay: Payer: BLUE CROSS/BLUE SHIELD

## 2018-08-23 VITALS — BP 153/75 | HR 50 | Temp 98.2°F | Resp 20

## 2018-08-23 DIAGNOSIS — C250 Malignant neoplasm of head of pancreas: Secondary | ICD-10-CM | POA: Diagnosis not present

## 2018-08-23 DIAGNOSIS — Z5189 Encounter for other specified aftercare: Secondary | ICD-10-CM | POA: Diagnosis not present

## 2018-08-23 DIAGNOSIS — R911 Solitary pulmonary nodule: Secondary | ICD-10-CM | POA: Diagnosis not present

## 2018-08-23 DIAGNOSIS — I10 Essential (primary) hypertension: Secondary | ICD-10-CM | POA: Diagnosis not present

## 2018-08-23 DIAGNOSIS — Z5111 Encounter for antineoplastic chemotherapy: Secondary | ICD-10-CM | POA: Diagnosis not present

## 2018-08-23 DIAGNOSIS — C787 Secondary malignant neoplasm of liver and intrahepatic bile duct: Secondary | ICD-10-CM | POA: Diagnosis not present

## 2018-08-23 DIAGNOSIS — R748 Abnormal levels of other serum enzymes: Secondary | ICD-10-CM | POA: Diagnosis not present

## 2018-08-23 MED ORDER — PEGFILGRASTIM-CBQV 6 MG/0.6ML ~~LOC~~ SOSY
6.0000 mg | PREFILLED_SYRINGE | Freq: Once | SUBCUTANEOUS | Status: AC
  Administered 2018-08-23: 6 mg via SUBCUTANEOUS

## 2018-08-23 MED ORDER — HEPARIN SOD (PORK) LOCK FLUSH 100 UNIT/ML IV SOLN
500.0000 [IU] | Freq: Once | INTRAVENOUS | Status: AC | PRN
  Administered 2018-08-23: 500 [IU]
  Filled 2018-08-23: qty 5

## 2018-08-23 MED ORDER — PEGFILGRASTIM-CBQV 6 MG/0.6ML ~~LOC~~ SOSY
PREFILLED_SYRINGE | SUBCUTANEOUS | Status: AC
  Filled 2018-08-23: qty 0.6

## 2018-08-23 MED ORDER — SODIUM CHLORIDE 0.9% FLUSH
10.0000 mL | INTRAVENOUS | Status: DC | PRN
  Administered 2018-08-23: 10 mL
  Filled 2018-08-23: qty 10

## 2018-08-23 NOTE — Patient Instructions (Signed)
Pegfilgrastim injection  What is this medicine?  PEGFILGRASTIM (PEG fil gra stim) is a long-acting granulocyte colony-stimulating factor that stimulates the growth of neutrophils, a type of white blood cell important in the body's fight against infection. It is used to reduce the incidence of fever and infection in patients with certain types of cancer who are receiving chemotherapy that affects the bone marrow, and to increase survival after being exposed to high doses of radiation.  This medicine may be used for other purposes; ask your health care provider or pharmacist if you have questions.  COMMON BRAND NAME(S): Fulphila, Neulasta, UDENYCA  What should I tell my health care provider before I take this medicine?  They need to know if you have any of these conditions:  -kidney disease  -latex allergy  -ongoing radiation therapy  -sickle cell disease  -skin reactions to acrylic adhesives (On-Body Injector only)  -an unusual or allergic reaction to pegfilgrastim, filgrastim, other medicines, foods, dyes, or preservatives  -pregnant or trying to get pregnant  -breast-feeding  How should I use this medicine?  This medicine is for injection under the skin. If you get this medicine at home, you will be taught how to prepare and give the pre-filled syringe or how to use the On-body Injector. Refer to the patient Instructions for Use for detailed instructions. Use exactly as directed. Tell your healthcare provider immediately if you suspect that the On-body Injector may not have performed as intended or if you suspect the use of the On-body Injector resulted in a missed or partial dose.  It is important that you put your used needles and syringes in a special sharps container. Do not put them in a trash can. If you do not have a sharps container, call your pharmacist or healthcare provider to get one.  Talk to your pediatrician regarding the use of this medicine in children. While this drug may be prescribed for  selected conditions, precautions do apply.  Overdosage: If you think you have taken too much of this medicine contact a poison control center or emergency room at once.  NOTE: This medicine is only for you. Do not share this medicine with others.  What if I miss a dose?  It is important not to miss your dose. Call your doctor or health care professional if you miss your dose. If you miss a dose due to an On-body Injector failure or leakage, a new dose should be administered as soon as possible using a single prefilled syringe for manual use.  What may interact with this medicine?  Interactions have not been studied.  Give your health care provider a list of all the medicines, herbs, non-prescription drugs, or dietary supplements you use. Also tell them if you smoke, drink alcohol, or use illegal drugs. Some items may interact with your medicine.  This list may not describe all possible interactions. Give your health care provider a list of all the medicines, herbs, non-prescription drugs, or dietary supplements you use. Also tell them if you smoke, drink alcohol, or use illegal drugs. Some items may interact with your medicine.  What should I watch for while using this medicine?  You may need blood work done while you are taking this medicine.  If you are going to need a MRI, CT scan, or other procedure, tell your doctor that you are using this medicine (On-Body Injector only).  What side effects may I notice from receiving this medicine?  Side effects that you should report to   your doctor or health care professional as soon as possible:  -allergic reactions like skin rash, itching or hives, swelling of the face, lips, or tongue  -back pain  -dizziness  -fever  -pain, redness, or irritation at site where injected  -pinpoint red spots on the skin  -red or dark-brown urine  -shortness of breath or breathing problems  -stomach or side pain, or pain at the shoulder  -swelling  -tiredness  -trouble passing urine or  change in the amount of urine  Side effects that usually do not require medical attention (report to your doctor or health care professional if they continue or are bothersome):  -bone pain  -muscle pain  This list may not describe all possible side effects. Call your doctor for medical advice about side effects. You may report side effects to FDA at 1-800-FDA-1088.  Where should I keep my medicine?  Keep out of the reach of children.  If you are using this medicine at home, you will be instructed on how to store it. Throw away any unused medicine after the expiration date on the label.  NOTE: This sheet is a summary. It may not cover all possible information. If you have questions about this medicine, talk to your doctor, pharmacist, or health care provider.   2019 [employer] (2017-09-25 16:57:08)

## 2018-08-29 ENCOUNTER — Telehealth: Payer: Self-pay | Admitting: *Deleted

## 2018-08-29 ENCOUNTER — Encounter: Payer: Self-pay | Admitting: Oncology

## 2018-08-29 NOTE — Telephone Encounter (Signed)
"  My husband Cory Kelly receives chemotherapy.  His energy is zero.  Can he take 'B' vitamins?  We know fatigue is a side effect but wondering what could help.  No nausea or vomiting.  Started Imodium which is getting diarrhea under control.  Getting up with diarrhea affected sleep improving.  Eating well.  Will encourage to drink more water; he's not drinking as much as he should.  No other symptoms or changes except a little depressed."  Avoid vitamins, minerals and other supplements as they may increase or decrease effects of treatment.  Dehydration affects energy; increase water intake and activity in home.  09-03-2018 Scheduled F/U with A.P.P.  08-21-2018 received first FOLFIRINOX.

## 2018-08-30 ENCOUNTER — Telehealth: Payer: Self-pay | Admitting: *Deleted

## 2018-08-30 NOTE — Telephone Encounter (Signed)
Received message from wife in [place] regarding fatigue and possible depression. Dr. Benay Spice is willing to start Celexa or Lexapro if patient personally wants to try one of these. Left VM at home/mobile # for patient to call office to discuss.

## 2018-08-30 NOTE — Telephone Encounter (Signed)
Racer called back to report he does not feel he needs an antidepressant at this time. Feels he is fatigued due to going to have BM so often. Reports diarrhea, but he is only taking #2 Imodium AD/day. Informed him he can take up to #8/day. He agrees to be more aggressive with the Imodium AD.

## 2018-08-31 ENCOUNTER — Other Ambulatory Visit: Payer: Self-pay | Admitting: Nurse Practitioner

## 2018-08-31 DIAGNOSIS — C250 Malignant neoplasm of head of pancreas: Secondary | ICD-10-CM

## 2018-09-02 ENCOUNTER — Other Ambulatory Visit: Payer: Self-pay | Admitting: Oncology

## 2018-09-03 ENCOUNTER — Inpatient Hospital Stay: Payer: BLUE CROSS/BLUE SHIELD

## 2018-09-03 ENCOUNTER — Encounter: Payer: Self-pay | Admitting: Nurse Practitioner

## 2018-09-03 ENCOUNTER — Inpatient Hospital Stay: Payer: BLUE CROSS/BLUE SHIELD | Attending: Nurse Practitioner | Admitting: Nurse Practitioner

## 2018-09-03 VITALS — BP 134/72 | HR 62 | Temp 98.4°F | Resp 20 | Ht 75.0 in | Wt 221.7 lb

## 2018-09-03 DIAGNOSIS — Z5111 Encounter for antineoplastic chemotherapy: Secondary | ICD-10-CM | POA: Diagnosis not present

## 2018-09-03 DIAGNOSIS — R5383 Other fatigue: Secondary | ICD-10-CM

## 2018-09-03 DIAGNOSIS — R200 Anesthesia of skin: Secondary | ICD-10-CM

## 2018-09-03 DIAGNOSIS — M545 Low back pain: Secondary | ICD-10-CM | POA: Diagnosis not present

## 2018-09-03 DIAGNOSIS — Z5189 Encounter for other specified aftercare: Secondary | ICD-10-CM | POA: Insufficient documentation

## 2018-09-03 DIAGNOSIS — C250 Malignant neoplasm of head of pancreas: Secondary | ICD-10-CM

## 2018-09-03 DIAGNOSIS — I10 Essential (primary) hypertension: Secondary | ICD-10-CM

## 2018-09-03 DIAGNOSIS — C787 Secondary malignant neoplasm of liver and intrahepatic bile duct: Secondary | ICD-10-CM | POA: Diagnosis not present

## 2018-09-03 DIAGNOSIS — R194 Change in bowel habit: Secondary | ICD-10-CM

## 2018-09-03 DIAGNOSIS — R911 Solitary pulmonary nodule: Secondary | ICD-10-CM | POA: Diagnosis not present

## 2018-09-03 DIAGNOSIS — Z95828 Presence of other vascular implants and grafts: Secondary | ICD-10-CM

## 2018-09-03 LAB — CBC WITH DIFFERENTIAL (CANCER CENTER ONLY)
Abs Immature Granulocytes: 0.16 10*3/uL — ABNORMAL HIGH (ref 0.00–0.07)
Basophils Absolute: 0.1 10*3/uL (ref 0.0–0.1)
Basophils Relative: 1 %
EOS PCT: 1 %
Eosinophils Absolute: 0.1 10*3/uL (ref 0.0–0.5)
HCT: 32 % — ABNORMAL LOW (ref 39.0–52.0)
Hemoglobin: 11.4 g/dL — ABNORMAL LOW (ref 13.0–17.0)
Immature Granulocytes: 2 %
Lymphocytes Relative: 5 %
Lymphs Abs: 0.5 10*3/uL — ABNORMAL LOW (ref 0.7–4.0)
MCH: 29.7 pg (ref 26.0–34.0)
MCHC: 35.6 g/dL (ref 30.0–36.0)
MCV: 83.3 fL (ref 80.0–100.0)
MONOS PCT: 7 %
Monocytes Absolute: 0.6 10*3/uL (ref 0.1–1.0)
Neutro Abs: 7.4 10*3/uL (ref 1.7–7.7)
Neutrophils Relative %: 84 %
Platelet Count: 145 10*3/uL — ABNORMAL LOW (ref 150–400)
RBC: 3.84 MIL/uL — ABNORMAL LOW (ref 4.22–5.81)
RDW: 13.2 % (ref 11.5–15.5)
WBC Count: 8.8 10*3/uL (ref 4.0–10.5)
nRBC: 0 % (ref 0.0–0.2)

## 2018-09-03 LAB — CMP (CANCER CENTER ONLY)
ALK PHOS: 818 U/L — AB (ref 38–126)
ALT: 101 U/L — ABNORMAL HIGH (ref 0–44)
AST: 70 U/L — AB (ref 15–41)
Albumin: 2.9 g/dL — ABNORMAL LOW (ref 3.5–5.0)
Anion gap: 10 (ref 5–15)
BUN: 6 mg/dL — ABNORMAL LOW (ref 8–23)
CO2: 29 mmol/L (ref 22–32)
CREATININE: 0.84 mg/dL (ref 0.61–1.24)
Calcium: 8.5 mg/dL — ABNORMAL LOW (ref 8.9–10.3)
Chloride: 101 mmol/L (ref 98–111)
GFR, Est AFR Am: >60 mL/min (ref >60)
GFR, Estimated: >60 mL/min (ref >60)
Glucose, Bld: 167 mg/dL — ABNORMAL HIGH (ref 70–99)
Potassium: 3.4 mmol/L — ABNORMAL LOW (ref 3.5–5.1)
Sodium: 140 mmol/L (ref 135–145)
Total Bilirubin: 1 mg/dL (ref 0.3–1.2)
Total Protein: 6 g/dL — ABNORMAL LOW (ref 6.5–8.1)

## 2018-09-03 MED ORDER — SODIUM CHLORIDE 0.9 % IV SOLN
1800.0000 mg/m2 | INTRAVENOUS | Status: DC
  Administered 2018-09-03: 4200 mg via INTRAVENOUS
  Filled 2018-09-03: qty 84

## 2018-09-03 MED ORDER — SODIUM CHLORIDE 0.9% FLUSH
10.0000 mL | Freq: Once | INTRAVENOUS | Status: AC
  Administered 2018-09-03: 10 mL
  Filled 2018-09-03: qty 10

## 2018-09-03 MED ORDER — ATROPINE SULFATE 0.4 MG/ML IJ SOLN
0.4000 mg | Freq: Once | INTRAMUSCULAR | Status: AC | PRN
  Administered 2018-09-03: 0.4 mg via INTRAVENOUS

## 2018-09-03 MED ORDER — SODIUM CHLORIDE 0.9 % IV SOLN
Freq: Once | INTRAVENOUS | Status: AC
  Administered 2018-09-03: 12:00:00 via INTRAVENOUS
  Filled 2018-09-03: qty 5

## 2018-09-03 MED ORDER — DEXTROSE 5 % IV SOLN
Freq: Once | INTRAVENOUS | Status: AC
  Administered 2018-09-03: 12:00:00 via INTRAVENOUS
  Filled 2018-09-03: qty 250

## 2018-09-03 MED ORDER — PALONOSETRON HCL INJECTION 0.25 MG/5ML
INTRAVENOUS | Status: AC
  Filled 2018-09-03: qty 5

## 2018-09-03 MED ORDER — LEUCOVORIN CALCIUM INJECTION 350 MG
300.0000 mg/m2 | Freq: Once | INTRAVENOUS | Status: AC
  Administered 2018-09-03: 696 mg via INTRAVENOUS
  Filled 2018-09-03: qty 34.8

## 2018-09-03 MED ORDER — HEPARIN SOD (PORK) LOCK FLUSH 100 UNIT/ML IV SOLN
500.0000 [IU] | Freq: Once | INTRAVENOUS | Status: DC
  Filled 2018-09-03: qty 5

## 2018-09-03 MED ORDER — ATROPINE SULFATE 1 MG/ML IJ SOLN: 0.5000 mg | Freq: Once | INTRAMUSCULAR | Status: DC | PRN

## 2018-09-03 MED ORDER — PALONOSETRON HCL INJECTION 0.25 MG/5ML
0.2500 mg | Freq: Once | INTRAVENOUS | Status: AC
  Administered 2018-09-03: 0.25 mg via INTRAVENOUS

## 2018-09-03 MED ORDER — DEXTROSE 5 % IV SOLN
Freq: Once | INTRAVENOUS | Status: AC
  Administered 2018-09-03: 16:00:00 via INTRAVENOUS
  Filled 2018-09-03: qty 250

## 2018-09-03 MED ORDER — OXALIPLATIN CHEMO INJECTION 100 MG/20ML
65.0000 mg/m2 | Freq: Once | INTRAVENOUS | Status: AC
  Administered 2018-09-03: 150 mg via INTRAVENOUS
  Filled 2018-09-03: qty 10

## 2018-09-03 MED ORDER — PANTOPRAZOLE SODIUM 20 MG PO TBEC: 20.0000 mg | DELAYED_RELEASE_TABLET | Freq: Every day | ORAL | 1 refills | Status: DC

## 2018-09-03 MED ORDER — ATROPINE SULFATE 0.4 MG/ML IJ SOLN
INTRAMUSCULAR | Status: AC
  Filled 2018-09-03: qty 1

## 2018-09-03 MED ORDER — IRINOTECAN HCL CHEMO INJECTION 100 MG/5ML
112.5000 mg/m2 | Freq: Once | INTRAVENOUS | Status: AC
  Administered 2018-09-03: 260 mg via INTRAVENOUS
  Filled 2018-09-03: qty 13

## 2018-09-03 NOTE — Patient Instructions (Signed)
[facility] Discharge Instructions for Patients Receiving Chemotherapy  Today you received the following chemotherapy agents Oxaliplatin (ELOXATIN), Leucovorin, Irinotecan (CAMPTOSAR) & Fluorouracil (ADRUCIL).  To help prevent nausea and vomiting after your treatment, we encourage you to take your nausea medication as prescribed.DO NOT TAKE ZOFRAN FOR THREE DAYS AFTER TREATMENT.   If you develop nausea and vomiting that is not controlled by your nausea medication, call the clinic.   BELOW ARE SYMPTOMS THAT SHOULD BE REPORTED IMMEDIATELY:  *FEVER GREATER THAN 100.5 F  *CHILLS WITH OR WITHOUT FEVER  NAUSEA AND VOMITING THAT IS NOT CONTROLLED WITH YOUR NAUSEA MEDICATION  *UNUSUAL SHORTNESS OF BREATH  *UNUSUAL BRUISING OR BLEEDING  TENDERNESS IN MOUTH AND THROAT WITH OR WITHOUT PRESENCE OF ULCERS  *URINARY PROBLEMS  *BOWEL PROBLEMS  UNUSUAL RASH Items with * indicate a potential emergency and should be followed up as soon as possible.  Feel free to call the clinic should you have any questions or concerns. The clinic phone number is [phone].  Please show the [facility] at check-in to the Emergency Department and triage nurse.  Oxaliplatin Injection What is this medicine? OXALIPLATIN (ox AL i PLA tin) is a chemotherapy drug. It targets fast dividing cells, like cancer cells, and causes these cells to die. This medicine is used to treat cancers of the colon and rectum, and many other cancers. This medicine may be used for other purposes; ask your health care provider or pharmacist if you have questions. COMMON BRAND NAME(S): Eloxatin What should I tell my health care provider before I take this medicine? They need to know if you have any of these conditions: -kidney disease -an unusual or allergic reaction to oxaliplatin, other chemotherapy, other medicines, foods, dyes, or preservatives -pregnant or trying to get pregnant -breast-feeding How should I  use this medicine? This drug is given as an infusion into a vein. It is administered in a hospital or clinic by a specially trained health care professional. Talk to your pediatrician regarding the use of this medicine in children. Special care may be needed. Overdosage: If you think you have taken too much of this medicine contact a poison control center or emergency room at once. NOTE: This medicine is only for you. Do not share this medicine with others. What if I miss a dose? It is important not to miss a dose. Call your doctor or health care professional if you are unable to keep an appointment. What may interact with this medicine? -medicines to increase blood counts like filgrastim, pegfilgrastim, sargramostim -probenecid -some antibiotics like amikacin, gentamicin, neomycin, polymyxin B, streptomycin, tobramycin -zalcitabine Talk to your doctor or health care professional before taking any of these medicines: -acetaminophen -aspirin -ibuprofen -ketoprofen -naproxen This list may not describe all possible interactions. Give your health care provider a list of all the medicines, herbs, non-prescription drugs, or dietary supplements you use. Also tell them if you smoke, drink alcohol, or use illegal drugs. Some items may interact with your medicine. What should I watch for while using this medicine? Your condition will be monitored carefully while you are receiving this medicine. You will need important blood work done while you are taking this medicine. This medicine can make you more sensitive to cold. Do not drink cold drinks or use ice. Cover exposed skin before coming in contact with cold temperatures or cold objects. When out in cold weather wear warm clothing and cover your mouth and nose to warm the air that goes into  your lungs. Tell your doctor if you get sensitive to the cold. This drug may make you feel generally unwell. This is not uncommon, as chemotherapy can affect healthy  cells as well as cancer cells. Report any side effects. Continue your course of treatment even though you feel ill unless your doctor tells you to stop. In some cases, you may be given additional medicines to help with side effects. Follow all directions for their use. Call your doctor or health care professional for advice if you get a fever, chills or sore throat, or other symptoms of a cold or flu. Do not treat yourself. This drug decreases your body's ability to fight infections. Try to avoid being around people who are sick. This medicine may increase your risk to bruise or bleed. Call your doctor or health care professional if you notice any unusual bleeding. Be careful brushing and flossing your teeth or using a toothpick because you may get an infection or bleed more easily. If you have any dental work done, tell your dentist you are receiving this medicine. Avoid taking products that contain aspirin, acetaminophen, ibuprofen, naproxen, or ketoprofen unless instructed by your doctor. These medicines may hide a fever. Do not become pregnant while taking this medicine. Women should inform their doctor if they wish to become pregnant or think they might be pregnant. There is a potential for serious side effects to an unborn child. Talk to your health care professional or pharmacist for more information. Do not breast-feed an infant while taking this medicine. Call your doctor or health care professional if you get diarrhea. Do not treat yourself. What side effects may I notice from receiving this medicine? Side effects that you should report to your doctor or health care professional as soon as possible: -allergic reactions like skin rash, itching or hives, swelling of the face, lips, or tongue -low blood counts - This drug may decrease the number of white blood cells, red blood cells and platelets. You may be at increased risk for infections and bleeding. -signs of infection - fever or chills,  cough, sore throat, pain or difficulty passing urine -signs of decreased platelets or bleeding - bruising, pinpoint red spots on the skin, black, tarry stools, nosebleeds -signs of decreased red blood cells - unusually weak or tired, fainting spells, lightheadedness -breathing problems -chest pain, pressure -cough -diarrhea -jaw tightness -mouth sores -nausea and vomiting -pain, swelling, redness or irritation at the injection site -pain, tingling, numbness in the hands or feet -problems with balance, talking, walking -redness, blistering, peeling or loosening of the skin, including inside the mouth -trouble passing urine or change in the amount of urine Side effects that usually do not require medical attention (report to your doctor or health care professional if they continue or are bothersome): -changes in vision -constipation -hair loss -loss of appetite -metallic taste in the mouth or changes in taste -stomach pain This list may not describe all possible side effects. Call your doctor for medical advice about side effects. You may report side effects to FDA at 1-800-FDA-1088. Where should I keep my medicine? This drug is given in a hospital or clinic and will not be stored at home. NOTE: This sheet is a summary. It may not cover all possible information. If you have questions about this medicine, talk to your doctor, pharmacist, or health care provider.  2019 Elsevier/Gold Standard (2008-01-15 17:22:47)  Leucovorin injection What is this medicine? LEUCOVORIN (loo koe VOR in) is used to prevent or treat  the harmful effects of some medicines. This medicine is used to treat anemia caused by a low amount of folic acid in the body. It is also used with 5-fluorouracil (5-FU) to treat colon cancer. This medicine may be used for other purposes; ask your health care provider or pharmacist if you have questions. What should I tell my health care provider before I take this  medicine? They need to know if you have any of these conditions: -anemia from low levels of vitamin B-12 in the blood -an unusual or allergic reaction to leucovorin, folic acid, other medicines, foods, dyes, or preservatives -pregnant or trying to get pregnant -breast-feeding How should I use this medicine? This medicine is for injection into a muscle or into a vein. It is given by a health care professional in a hospital or clinic setting. Talk to your pediatrician regarding the use of this medicine in children. Special care may be needed. Overdosage: If you think you have taken too much of this medicine contact a poison control center or emergency room at once. NOTE: This medicine is only for you. Do not share this medicine with others. What if I miss a dose? This does not apply. What may interact with this medicine? -capecitabine -fluorouracil -phenobarbital -phenytoin -primidone -trimethoprim-sulfamethoxazole This list may not describe all possible interactions. Give your health care provider a list of all the medicines, herbs, non-prescription drugs, or dietary supplements you use. Also tell them if you smoke, drink alcohol, or use illegal drugs. Some items may interact with your medicine. What should I watch for while using this medicine? Your condition will be monitored carefully while you are receiving this medicine. This medicine may increase the side effects of 5-fluorouracil, 5-FU. Tell your doctor or health care professional if you have diarrhea or mouth sores that do not get better or that get worse. What side effects may I notice from receiving this medicine? Side effects that you should report to your doctor or health care professional as soon as possible: -allergic reactions like skin rash, itching or hives, swelling of the face, lips, or tongue -breathing problems -fever, infection -mouth sores -unusual bleeding or bruising -unusually weak or tired Side effects that  usually do not require medical attention (report to your doctor or health care professional if they continue or are bothersome): -constipation or diarrhea -loss of appetite -nausea, vomiting This list may not describe all possible side effects. Call your doctor for medical advice about side effects. You may report side effects to FDA at 1-800-FDA-1088. Where should I keep my medicine? This drug is given in a hospital or clinic and will not be stored at home. NOTE: This sheet is a summary. It may not cover all possible information. If you have questions about this medicine, talk to your doctor, pharmacist, or health care provider.  2019 Else[employer]/Gold Standard (2007-12-25 16:50:29)  Irinotecan injection What is this medicine? IRINOTECAN (ir in oh TEE kan ) is a chemotherapy drug. It is used to treat colon and rectal cancer. This medicine may be used for other purposes; ask your health care provider or pharmacist if you have questions. COMMON BRAND NAME(S): Camptosar What should I tell my health care provider before I take this medicine? They need to know if you have any of these conditions: -dehydration -diarrhea -infection (especially a virus infection such as chickenpox, cold sores, or herpes) -liver disease -low blood counts, like low white cell, platelet, or red cell counts -low levels of calcium, magnesium, or potassium in  the blood -recent or ongoing radiation therapy -an unusual or allergic reaction to irinotecan, other medicines, foods, dyes, or preservatives -pregnant or trying to get pregnant -breast-feeding How should I use this medicine? This drug is given as an infusion into a vein. It is administered in a hospital or clinic by a specially trained health care professional. Talk to your pediatrician regarding the use of this medicine in children. Special care may be needed. Overdosage: If you think you have taken too much of this medicine contact a poison control center or  emergency room at once. NOTE: This medicine is only for you. Do not share this medicine with others. What if I miss a dose? It is important not to miss your dose. Call your doctor or health care professional if you are unable to keep an appointment. What may interact with this medicine? This medicine may interact with the following medications: -antiviral medicines for HIV or AIDS -certain antibiotics like rifampin or rifabutin -certain medicines for fungal infections like itraconazole, ketoconazole, posaconazole, and voriconazole -certain medicines for seizures like carbamazepine, phenobarbital, phenotoin -clarithromycin -gemfibrozil -nefazodone -St. John's Wort This list may not describe all possible interactions. Give your health care provider a list of all the medicines, herbs, non-prescription drugs, or dietary supplements you use. Also tell them if you smoke, drink alcohol, or use illegal drugs. Some items may interact with your medicine. What should I watch for while using this medicine? Your condition will be monitored carefully while you are receiving this medicine. You will need important blood work done while you are taking this medicine. This drug may make you feel generally unwell. This is not uncommon, as chemotherapy can affect healthy cells as well as cancer cells. Report any side effects. Continue your course of treatment even though you feel ill unless your doctor tells you to stop. In some cases, you may be given additional medicines to help with side effects. Follow all directions for their use. You may get drowsy or dizzy. Do not drive, use machinery, or do anything that needs mental alertness until you know how this medicine affects you. Do not stand or sit up quickly, especially if you are an older patient. This reduces the risk of dizzy or fainting spells. Call your doctor or health care professional for advice if you get a fever, chills or sore throat, or other symptoms  of a cold or flu. Do not treat yourself. This drug decreases your body's ability to fight infections. Try to avoid being around people who are sick. This medicine may increase your risk to bruise or bleed. Call your doctor or health care professional if you notice any unusual bleeding. Be careful brushing and flossing your teeth or using a toothpick because you may get an infection or bleed more easily. If you have any dental work done, tell your dentist you are receiving this medicine. Avoid taking products that contain aspirin, acetaminophen, ibuprofen, naproxen, or ketoprofen unless instructed by your doctor. These medicines may hide a fever. Do not become pregnant while taking this medicine. Women should inform their doctor if they wish to become pregnant or think they might be pregnant. There is a potential for serious side effects to an unborn child. Talk to your health care professional or pharmacist for more information. Do not breast-feed an infant while taking this medicine. What side effects may I notice from receiving this medicine? Side effects that you should report to your doctor or health care professional as soon as possible: -allergic  reactions like skin rash, itching or hives, swelling of the face, lips, or tongue -chest pain -diarrhea -flushing, runny nose, sweating during infusion -low blood counts - this medicine may decrease the number of white blood cells, red blood cells and platelets. You may be at increased risk for infections and bleeding. -nausea, vomiting -pain, swelling, warmth in the leg -signs of decreased platelets or bleeding - bruising, pinpoint red spots on the skin, black, tarry stools, blood in the urine -signs of infection - fever or chills, cough, sore throat, pain or difficulty passing urine -signs of decreased red blood cells - unusually weak or tired, fainting spells, lightheadedness Side effects that usually do not require medical attention (report to  your doctor or health care professional if they continue or are bothersome): -constipation -hair loss -headache -loss of appetite -mouth sores -stomach pain This list may not describe all possible side effects. Call your doctor for medical advice about side effects. You may report side effects to FDA at 1-800-FDA-1088. Where should I keep my medicine? This drug is given in a hospital or clinic and will not be stored at home. NOTE: This sheet is a summary. It may not cover all possible information. If you have questions about this medicine, talk to your doctor, pharmacist, or health care provider.  2019 [employer]/Gold Standard (2017-09-05 15:02:58)  Fluorouracil, 5-FU injection What is this medicine? FLUOROURACIL, 5-FU (flure oh YOOR a sil) is a chemotherapy drug. It slows the growth of cancer cells. This medicine is used to treat many types of cancer like breast cancer, colon or rectal cancer, pancreatic cancer, and stomach cancer. This medicine may be used for other purposes; ask your health care provider or pharmacist if you have questions. COMMON BRAND NAME(S): Adrucil What should I tell my health care provider before I take this medicine? They need to know if you have any of these conditions: -blood disorders -dihydropyrimidine dehydrogenase (DPD) deficiency -infection (especially a virus infection such as chickenpox, cold sores, or herpes) -kidney disease -liver disease -malnourished, poor nutrition -recent or ongoing radiation therapy -an unusual or allergic reaction to fluorouracil, other chemotherapy, other medicines, foods, dyes, or preservatives -pregnant or trying to get pregnant -breast-feeding How should I use this medicine? This drug is given as an infusion or injection into a vein. It is administered in a hospital or clinic by a specially trained health care professional. Talk to your pediatrician regarding the use of this medicine in children. Special care may be  needed. Overdosage: If you think you have taken too much of this medicine contact a poison control center or emergency room at once. NOTE: This medicine is only for you. Do not share this medicine with others. What if I miss a dose? It is important not to miss your dose. Call your doctor or health care professional if you are unable to keep an appointment. What may interact with this medicine? -allopurinol -cimetidine -dapsone -digoxin -hydroxyurea -leucovorin -levamisole -medicines for seizures like ethotoin, fosphenytoin, phenytoin -medicines to increase blood counts like filgrastim, pegfilgrastim, sargramostim -medicines that treat or prevent blood clots like warfarin, enoxaparin, and dalteparin -methotrexate -metronidazole -pyrimethamine -some other chemotherapy drugs like busulfan, cisplatin, estramustine, vinblastine -trimethoprim -trimetrexate -vaccines Talk to your doctor or health care professional before taking any of these medicines: -acetaminophen -aspirin -ibuprofen -ketoprofen -naproxen This list may not describe all possible interactions. Give your health care provider a list of all the medicines, herbs, non-prescription drugs, or dietary supplements you use. Also tell them if you smoke,  drink alcohol, or use illegal drugs. Some items may interact with your medicine. What should I watch for while using this medicine? Visit your doctor for checks on your progress. This drug may make you feel generally unwell. This is not uncommon, as chemotherapy can affect healthy cells as well as cancer cells. Report any side effects. Continue your course of treatment even though you feel ill unless your doctor tells you to stop. In some cases, you may be given additional medicines to help with side effects. Follow all directions for their use. Call your doctor or health care professional for advice if you get a fever, chills or sore throat, or other symptoms of a cold or flu. Do not  treat yourself. This drug decreases your body's ability to fight infections. Try to avoid being around people who are sick. This medicine may increase your risk to bruise or bleed. Call your doctor or health care professional if you notice any unusual bleeding. Be careful brushing and flossing your teeth or using a toothpick because you may get an infection or bleed more easily. If you have any dental work done, tell your dentist you are receiving this medicine. Avoid taking products that contain aspirin, acetaminophen, ibuprofen, naproxen, or ketoprofen unless instructed by your doctor. These medicines may hide a fever. Do not become pregnant while taking this medicine. Women should inform their doctor if they wish to become pregnant or think they might be pregnant. There is a potential for serious side effects to an unborn child. Talk to your health care professional or pharmacist for more information. Do not breast-feed an infant while taking this medicine. Men should inform their doctor if they wish to father a child. This medicine may lower sperm counts. Do not treat diarrhea with over the counter products. Contact your doctor if you have diarrhea that lasts more than 2 days or if it is severe and watery. This medicine can make you more sensitive to the sun. Keep out of the sun. If you cannot avoid being in the sun, wear protective clothing and use sunscreen. Do not use sun lamps or tanning beds/booths. What side effects may I notice from receiving this medicine? Side effects that you should report to your doctor or health care professional as soon as possible: -allergic reactions like skin rash, itching or hives, swelling of the face, lips, or tongue -low blood counts - this medicine may decrease the number of white blood cells, red blood cells and platelets. You may be at increased risk for infections and bleeding. -signs of infection - fever or chills, cough, sore throat, pain or difficulty  passing urine -signs of decreased platelets or bleeding - bruising, pinpoint red spots on the skin, black, tarry stools, blood in the urine -signs of decreased red blood cells - unusually weak or tired, fainting spells, lightheadedness -breathing problems -changes in vision -chest pain -mouth sores -nausea and vomiting -pain, swelling, redness at site where injected -pain, tingling, numbness in the hands or feet -redness, swelling, or sores on hands or feet -stomach pain -unusual bleeding Side effects that usually do not require medical attention (report to your doctor or health care professional if they continue or are bothersome): -changes in finger or toe nails -diarrhea -dry or itchy skin -hair loss -headache -loss of appetite -sensitivity of eyes to the light -stomach upset -unusually teary eyes This list may not describe all possible side effects. Call your doctor for medical advice about side effects. You may report side effects  to FDA at 1-800-FDA-1088. Where should I keep my medicine? This drug is given in a hospital or clinic and will not be stored at home. NOTE: This sheet is a summary. It may not cover all possible information. If you have questions about this medicine, talk to your doctor, pharmacist, or health care provider.  2019 [employer]/Gold Standard (2007-10-24 13:53:16)

## 2018-09-03 NOTE — Progress Notes (Addendum)
[place] OFFICE PROGRESS NOTE   Diagnosis: Pancreas cancer  INTERVAL HISTORY:   Cory Kelly returns as scheduled.  He completed cycle 1 FOLFIRINOX 08/21/2018.  He had no significant nausea or vomiting following the chemotherapy.  He had a few episodes yesterday at which time he was burping and felt bloated.  He has periodic loose stools.  He then becomes constipated.  No mouth sores.  He notes increased fatigue.  He had increased pain in both feet after chemotherapy.  This is better today.  The numbness is unchanged.  He has intermittent low back pain.  Objective:  Vital signs in last 24 hours:  Blood pressure 134/72, pulse 62, temperature 98.4 F (36.9 C), temperature source Oral, resp. rate 20, height _0  (1.905 m), weight 221 lb 11.2 oz (100.6 kg), SpO2 100 %.    HEENT: No thrush or ulcers. Resp: Lungs clear bilaterally. Cardio: Regular rate and rhythm. GI: Abdomen is soft, mildly distended.  No hepatomegaly. Vascular: No leg edema. Port-A-Cath without erythema.  Lab Results:  Lab Results  Component Value Date   WBC 8.8 09/03/2018   HGB 11.4 (L) 09/03/2018   HCT 32.0 (L) 09/03/2018   MCV 83.3 09/03/2018   PLT 145 (L) 09/03/2018   NEUTROABS 7.4 09/03/2018    Imaging:  No results found.  Medications: I have reviewed the patient's current medications.  Assessment/Plan: 1. Pancreas head mass  CT abdomen/pelvis 08/03/2017-fullness in the head of the pancreas with stranding in the peripancreatic fat and pancreatic ductal dilatation.   MRI abdomen 08/04/2017-findings favored to represent acute on chronic pancreatitis; equivocal soft tissue fullness within the head and uncinate process of the pancreas; indeterminate too small to characterize right hepatic lobe lesion; small volume abdominal ascites; splenomegaly.   CA-19-9 elevated at 977on 08/04/2017.  Status postupper EUS 08/10/2017-findings of an irregular masslike region in the pancreatic head  measuring approximately 2.7 cm. This directly abutted the main portal vein but no other major vascular structures. Biopsies obtained with preliminary cytology positive for malignancy, likely adenocarcinoma. The final report is pending. The common bile duct and main pancreatic duct were both dilated. ERCP was then proceeded with forstent placement. Multiple attempts were made tocannulate the bile duct without success. The procedure was aborted.  08/16/2017 ERCP with placement of a metal biliary stent in the common bile duct by Dr. Francella Solian at [facility].  Cycle 1 FOLFIRINOX 08/31/2017  Cycle 2 FOLFIRINOX 09/14/2017  Cycle 3 FOLFIRINOX 09/28/2017  Cycle 4 FOLFIRINOX 10/12/2017 (oxaliplatin dose reduced secondary to thrombocytopenia)  Cycle 5 FOLFIRINOX 10/26/2017  Cycle 6 FOLFIRINOX 11/09/2017  [facility]aging CTs at [facility] 11/29/2017-no definitive evidence of distant metastatic disease. 2 subcentimeter liver lesions described on prior MRI not visualized on CT. Ill-defined pancreatic head mass stable to decreased in size measuring 2.1 x 2 cm. Peripancreatic inflammatory stranding decreased from prior. No biliary ductal dilatation. Celiac axis less than 180 degrees abutment. Common hepatic artery with greater than 180 degrees encasement; superior mesenteric artery with short segment less than 180 degrees abutment; portal vein/superior mesenteric vein with greater than 180 degrees encasement of the extrahepatic portal vein with associated circumferential narrowing at the portomesenteric, overall substantially improved from prior examination. Now less than 180 degree abutment of the SMV. The portal vein and SMV remain patent. Splenic vein patent.  Cycle 1 gemcitabine/Abraxane 12/06/2017  Cycle 2 gemcitabine/Abraxane 12/20/2017  Cycle 3 gemcitabine/Abraxane 01/03/2018  Cycle 4 gemcitabine/Abraxane 01/17/2018  Cycle 5 gemcitabine 01/31/2018 (Abraxane held due to neuropathy)  Cycle 6 gemcitabine  02/14/2018  (Abraxane held due to neuropathy)  Cycle 7 gemcitabine 02/28/2018 (Abraxane held due to neuropathy)  CT chest/abdomen/pelvis at [facility] 03/08/2018- stable appearance of previously identified infiltrating pancreatic head mass. No CT evidence of metastatic disease.  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 Ill-defined pancreatic head mass not felt to be significantly changed.  CT chest/abdomen/pelvis 05/10/2018- ill-defined hypoattenuating pancreatic head mass and ill-defined soft tissue abutting the celiac axis with mildly increased dilatation of the main pancreatic duct. New soft tissue nodule along the right upper lobe bronchus; hypoattenuating liver lesions concerning for metastasis.  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 The stent traverses the pancreatic mass and extends into the duodenum. Increase in size of 2 liver lesions. Pancreatic mass appears similar. Indistinct tissue planes around the duodenum along with wall thickening in the gallbladder and especially the gallbladder neck.  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  Cycle 1 FOLFIRINOX 08/21/2018  Cycle 2 FOLFIRINOX 09/03/2018 2. Hypertension 3. Port-A-Cath placement 08/30/2017 4. History of elevated bilirubin-questionGilbert's syndrome 5. Mild leukopenia, thrombocytopenia-question secondary to portal hypertension/splenomegaly;09/14/2017 white count improved, platelet count stable;progressive thrombocytopenia following gemcitabine/Abraxane 6. Kidney stones 7. Fever following cycles 2 and 3 gemcitabine/Abraxane,? Fever related to gemcitabine 8.Jaundice, elevated liver enzymes and bilirubin 06/22/2018; status post ERCP 06/25/2018 with debris removed from the existing stent and a new plastic stent placed. Recurrent jaundice 07/19/2018 status post ERCP 07/20/2018 with finding that the previously placed plastic stent had migrated distally. There was debris and sludge on the stent. The previously placed stent was removed. 5 to 8 mm stricture just proximal to the existing SEMS. 2 plastic stents were placed. ERCP 08/16/2018- 2 plastic biliary stents were removed from within the previously placed partially covered SEMS; stricture confirmed just proximal to the previously placed SEMS, treated with placement of an 8 cm long 10 mm diameter uncovered SEMS in good position.   Disposition: Cory Kelly appears unchanged.  He has completed 1 cycle of FOLFIRINOX.  Plan to proceed with cycle 2 today as scheduled.   He had some loose stools after treatment and then developed constipation.  We discussed what constitutes diarrhea and when he should take Imodium.  He understands the back  pain may be related to the pancreas cancer.  We reviewed the labs from today.  Counts are adequate for treatment.  LFTs continue to improve.  He will return for lab, follow-up in the next  cycle of chemotherapy in 2 weeks.  He will contact the office in the interim with any problems.  Patient seen with Dr. Benay Spice.  25 minutes were spent face-to-face at today's visit with the majority of that time involved in counseling/coordination of care.  Ned Card ANP/GNP-BC   09/03/2018  11:24 AM This was a shared visit with Ned Card.  Cory Kelly completed 1 cycle of salvage therapy with FOLFIRINOX.  He will complete cycle 2 today.  The liver enzymes continue to improve.  We discussed the irregular bowel habits with Cory Kelly and his wife.  He does not appear to be having significant diarrhea from chemotherapy.  He will undergo restaging CTs after 4 or 5 cycles of FOLFIRINOX.  Julieanne Manson, MD

## 2018-09-04 ENCOUNTER — Telehealth: Payer: Self-pay | Admitting: Oncology

## 2018-09-04 ENCOUNTER — Other Ambulatory Visit: Payer: BLUE CROSS/BLUE SHIELD

## 2018-09-04 ENCOUNTER — Ambulatory Visit: Payer: BLUE CROSS/BLUE SHIELD | Admitting: Nurse Practitioner

## 2018-09-04 NOTE — Telephone Encounter (Signed)
Scheduled appt per 3/2 los - pt to get an updated schedule next visit.   

## 2018-09-05 ENCOUNTER — Inpatient Hospital Stay: Payer: BLUE CROSS/BLUE SHIELD

## 2018-09-05 VITALS — BP 107/71 | HR 52 | Temp 98.6°F | Resp 18

## 2018-09-05 DIAGNOSIS — Z5189 Encounter for other specified aftercare: Secondary | ICD-10-CM | POA: Diagnosis not present

## 2018-09-05 DIAGNOSIS — C250 Malignant neoplasm of head of pancreas: Secondary | ICD-10-CM | POA: Diagnosis not present

## 2018-09-05 DIAGNOSIS — C787 Secondary malignant neoplasm of liver and intrahepatic bile duct: Secondary | ICD-10-CM | POA: Diagnosis not present

## 2018-09-05 DIAGNOSIS — Z5111 Encounter for antineoplastic chemotherapy: Secondary | ICD-10-CM | POA: Diagnosis not present

## 2018-09-05 MED ORDER — PEGFILGRASTIM-CBQV 6 MG/0.6ML ~~LOC~~ SOSY
PREFILLED_SYRINGE | SUBCUTANEOUS | Status: AC
  Filled 2018-09-05: qty 0.6

## 2018-09-05 MED ORDER — PEGFILGRASTIM-CBQV 6 MG/0.6ML ~~LOC~~ SOSY
6.0000 mg | PREFILLED_SYRINGE | Freq: Once | SUBCUTANEOUS | Status: AC
  Administered 2018-09-05: 6 mg via SUBCUTANEOUS

## 2018-09-05 MED ORDER — HEPARIN SOD (PORK) LOCK FLUSH 100 UNIT/ML IV SOLN
500.0000 [IU] | Freq: Once | INTRAVENOUS | Status: AC | PRN
  Administered 2018-09-05: 500 [IU]
  Filled 2018-09-05: qty 5

## 2018-09-05 MED ORDER — SODIUM CHLORIDE 0.9% FLUSH
10.0000 mL | INTRAVENOUS | Status: DC | PRN
  Administered 2018-09-05: 10 mL
  Filled 2018-09-05: qty 10

## 2018-09-13 ENCOUNTER — Encounter: Payer: Self-pay | Admitting: Oncology

## 2018-09-14 ENCOUNTER — Encounter: Payer: Self-pay | Admitting: *Deleted

## 2018-09-16 ENCOUNTER — Other Ambulatory Visit: Payer: Self-pay | Admitting: Oncology

## 2018-09-16 MED ORDER — OXYCODONE-ACETAMINOPHEN 5-325 MG PO TABS: 1.0000 | ORAL_TABLET | ORAL | 0 refills | Status: DC | PRN

## 2018-09-18 ENCOUNTER — Other Ambulatory Visit: Payer: Self-pay | Admitting: *Deleted

## 2018-09-18 ENCOUNTER — Inpatient Hospital Stay (HOSPITAL_BASED_OUTPATIENT_CLINIC_OR_DEPARTMENT_OTHER): Payer: BLUE CROSS/BLUE SHIELD | Admitting: Oncology

## 2018-09-18 ENCOUNTER — Other Ambulatory Visit: Payer: Self-pay

## 2018-09-18 ENCOUNTER — Inpatient Hospital Stay: Payer: BLUE CROSS/BLUE SHIELD

## 2018-09-18 VITALS — BP 133/82 | HR 61 | Temp 98.0°F | Resp 17 | Ht 75.0 in | Wt 217.5 lb

## 2018-09-18 DIAGNOSIS — R911 Solitary pulmonary nodule: Secondary | ICD-10-CM

## 2018-09-18 DIAGNOSIS — I10 Essential (primary) hypertension: Secondary | ICD-10-CM

## 2018-09-18 DIAGNOSIS — L989 Disorder of the skin and subcutaneous tissue, unspecified: Secondary | ICD-10-CM

## 2018-09-18 DIAGNOSIS — M545 Low back pain: Secondary | ICD-10-CM

## 2018-09-18 DIAGNOSIS — C250 Malignant neoplasm of head of pancreas: Secondary | ICD-10-CM

## 2018-09-18 DIAGNOSIS — C787 Secondary malignant neoplasm of liver and intrahepatic bile duct: Secondary | ICD-10-CM

## 2018-09-18 DIAGNOSIS — R109 Unspecified abdominal pain: Secondary | ICD-10-CM

## 2018-09-18 DIAGNOSIS — Z5111 Encounter for antineoplastic chemotherapy: Secondary | ICD-10-CM | POA: Diagnosis not present

## 2018-09-18 DIAGNOSIS — R351 Nocturia: Secondary | ICD-10-CM

## 2018-09-18 DIAGNOSIS — Z5189 Encounter for other specified aftercare: Secondary | ICD-10-CM | POA: Diagnosis not present

## 2018-09-18 DIAGNOSIS — Z95828 Presence of other vascular implants and grafts: Secondary | ICD-10-CM

## 2018-09-18 LAB — CBC WITH DIFFERENTIAL (CANCER CENTER ONLY)
Abs Immature Granulocytes: 0.12 10*3/uL — ABNORMAL HIGH (ref 0.00–0.07)
Basophils Absolute: 0 10*3/uL (ref 0.0–0.1)
Basophils Relative: 1 %
EOS ABS: 0 10*3/uL (ref 0.0–0.5)
Eosinophils Relative: 1 %
HCT: 31.7 % — ABNORMAL LOW (ref 39.0–52.0)
Hemoglobin: 10.8 g/dL — ABNORMAL LOW (ref 13.0–17.0)
Immature Granulocytes: 2 %
LYMPHS ABS: 0.3 10*3/uL — AB (ref 0.7–4.0)
Lymphocytes Relative: 5 %
MCH: 29 pg (ref 26.0–34.0)
MCHC: 34.1 g/dL (ref 30.0–36.0)
MCV: 85 fL (ref 80.0–100.0)
Monocytes Absolute: 0.4 10*3/uL (ref 0.1–1.0)
Monocytes Relative: 6 %
Neutro Abs: 5.6 10*3/uL (ref 1.7–7.7)
Neutrophils Relative %: 85 %
Platelet Count: 162 10*3/uL (ref 150–400)
RBC: 3.73 MIL/uL — ABNORMAL LOW (ref 4.22–5.81)
RDW: 13.7 % (ref 11.5–15.5)
WBC Count: 6.5 10*3/uL (ref 4.0–10.5)
nRBC: 0 % (ref 0.0–0.2)

## 2018-09-18 LAB — CMP (CANCER CENTER ONLY)
ALK PHOS: 584 U/L — AB (ref 38–126)
ALT: 113 U/L — ABNORMAL HIGH (ref 0–44)
ANION GAP: 11 (ref 5–15)
AST: 72 U/L — ABNORMAL HIGH (ref 15–41)
Albumin: 2.7 g/dL — ABNORMAL LOW (ref 3.5–5.0)
BUN: 6 mg/dL — ABNORMAL LOW (ref 8–23)
CO2: 28 mmol/L (ref 22–32)
Calcium: 8.4 mg/dL — ABNORMAL LOW (ref 8.9–10.3)
Chloride: 100 mmol/L (ref 98–111)
Creatinine: 0.77 mg/dL (ref 0.61–1.24)
GFR, Est AFR Am: >60 mL/min (ref >60)
GFR, Estimated: >60 mL/min (ref >60)
Glucose, Bld: 143 mg/dL — ABNORMAL HIGH (ref 70–99)
Potassium: 3.6 mmol/L (ref 3.5–5.1)
Sodium: 139 mmol/L (ref 135–145)
Total Bilirubin: 1 mg/dL (ref 0.3–1.2)
Total Protein: 6 g/dL — ABNORMAL LOW (ref 6.5–8.1)

## 2018-09-18 MED ORDER — IRINOTECAN HCL CHEMO INJECTION 100 MG/5ML
112.5000 mg/m2 | Freq: Once | INTRAVENOUS | Status: AC
  Administered 2018-09-18: 260 mg via INTRAVENOUS
  Filled 2018-09-18: qty 13

## 2018-09-18 MED ORDER — ATROPINE SULFATE 1 MG/ML IJ SOLN
0.5000 mg | Freq: Once | INTRAMUSCULAR | Status: AC | PRN
  Administered 2018-09-18: 0.5 mg via INTRAVENOUS

## 2018-09-18 MED ORDER — PALONOSETRON HCL INJECTION 0.25 MG/5ML
0.2500 mg | Freq: Once | INTRAVENOUS | Status: AC
  Administered 2018-09-18: 0.25 mg via INTRAVENOUS

## 2018-09-18 MED ORDER — DEXTROSE 5 % IV SOLN
Freq: Once | INTRAVENOUS | Status: AC
  Administered 2018-09-18: 15:00:00 via INTRAVENOUS
  Filled 2018-09-18: qty 250

## 2018-09-18 MED ORDER — SODIUM CHLORIDE 0.9 % IV SOLN
1800.0000 mg/m2 | INTRAVENOUS | Status: DC
  Administered 2018-09-18: 4200 mg via INTRAVENOUS
  Filled 2018-09-18: qty 84

## 2018-09-18 MED ORDER — LEUCOVORIN CALCIUM INJECTION 350 MG
300.0000 mg/m2 | Freq: Once | INTRAVENOUS | Status: AC
  Administered 2018-09-18: 696 mg via INTRAVENOUS
  Filled 2018-09-18: qty 34.8

## 2018-09-18 MED ORDER — OXALIPLATIN CHEMO INJECTION 100 MG/20ML
65.0000 mg/m2 | Freq: Once | INTRAVENOUS | Status: AC
  Administered 2018-09-18: 150 mg via INTRAVENOUS
  Filled 2018-09-18: qty 10

## 2018-09-18 MED ORDER — ATROPINE SULFATE 1 MG/ML IJ SOLN
INTRAMUSCULAR | Status: AC
  Filled 2018-09-18: qty 1

## 2018-09-18 MED ORDER — SODIUM CHLORIDE 0.9% FLUSH
10.0000 mL | INTRAVENOUS | Status: DC | PRN
  Filled 2018-09-18: qty 10

## 2018-09-18 MED ORDER — SODIUM CHLORIDE 0.9% FLUSH
10.0000 mL | Freq: Once | INTRAVENOUS | Status: AC
  Administered 2018-09-18: 10 mL
  Filled 2018-09-18: qty 10

## 2018-09-18 MED ORDER — SODIUM CHLORIDE 0.9 % IV SOLN
Freq: Once | INTRAVENOUS | Status: AC
  Administered 2018-09-18: 12:00:00 via INTRAVENOUS
  Filled 2018-09-18: qty 5

## 2018-09-18 MED ORDER — HEPARIN SOD (PORK) LOCK FLUSH 100 UNIT/ML IV SOLN
500.0000 [IU] | Freq: Once | INTRAVENOUS | Status: DC | PRN
  Filled 2018-09-18: qty 5

## 2018-09-18 MED ORDER — PALONOSETRON HCL INJECTION 0.25 MG/5ML
INTRAVENOUS | Status: AC
  Filled 2018-09-18: qty 5

## 2018-09-18 MED ORDER — DEXTROSE 5 % IV SOLN
Freq: Once | INTRAVENOUS | Status: AC
  Administered 2018-09-18: 12:00:00 via INTRAVENOUS
  Filled 2018-09-18: qty 250

## 2018-09-18 NOTE — Progress Notes (Signed)
[place] OFFICE PROGRESS NOTE   Diagnosis: Pancreas cancer  INTERVAL HISTORY:   Cory Kelly complete another cycle of FOLFIRINOX on 09/03/2018.  No nausea/vomiting, mouth sores, or diarrhea.  No change in neuropathy symptoms.  He has nocturia.  He notes rumbling of the stomach at night.  He did not feel well toward the end of last week.  He felt better after taking Percocet. He has noted a nodule in the left groin.  He has pain in the mid back.  Objective:  Vital signs in last 24 hours:  Blood pressure 133/82, pulse 61, temperature 98 F (36.7 C), temperature source Oral, resp. rate 17, height '6\' 3"'$  (1.905 m), weight 217 lb 8 oz (98.7 kg), SpO2 99 %.    HEENT: No thrush or ulcers Resp: Lungs clear bilaterally Cardio: Regular rate and rhythm GI: Nontender, no mass, no hepatomegaly Vascular: No leg edema  Skin: At the left upper medial thigh there is a 1.5 cm area of erythema with subcutaneous induration and an overlying pustule.  I was able to express purulent material from this lesion.  Palms without erythema.  Portacath/PICC-without erythema  Lab Results:  Lab Results  Component Value Date   WBC 6.5 09/18/2018   HGB 10.8 (L) 09/18/2018   HCT 31.7 (L) 09/18/2018   MCV 85.0 09/18/2018   PLT 162 09/18/2018   NEUTROABS 5.6 09/18/2018    CMP  Lab Results  Component Value Date   NA 139 09/18/2018   K 3.6 09/18/2018   CL 100 09/18/2018   CO2 28 09/18/2018   GLUCOSE 143 (H) 09/18/2018   BUN 6 (L) 09/18/2018   CREATININE 0.77 09/18/2018   CALCIUM 8.4 (L) 09/18/2018   PROT 6.0 (L) 09/18/2018   ALBUMIN 2.7 (L) 09/18/2018   AST 72 (H) 09/18/2018   ALT 113 (H) 09/18/2018   ALKPHOS 584 (H) 09/18/2018   BILITOT 1.0 09/18/2018   GFRNONAA >60 09/18/2018   GFRAA >60 09/18/2018     Medications: I have reviewed the patient's current medications.   Assessment/Plan: 1. Pancreas head mass  CT abdomen/pelvis 08/03/2017-fullness in the head of the pancreas  with stranding in the peripancreatic fat and pancreatic ductal dilatation.   MRI abdomen 08/04/2017-findings favored to represent acute on chronic pancreatitis; equivocal soft tissue fullness within the head and uncinate process of the pancreas; indeterminate too small to characterize right hepatic lobe lesion; small volume abdominal ascites; splenomegaly.   CA-19-9 elevated at 977on 08/04/2017.  Status postupper EUS 08/10/2017-findings of an irregular masslike region in the pancreatic head measuring approximately 2.7 cm. This directly abutted the main portal vein but no other major vascular structures. Biopsies obtained with preliminary cytology positive for malignancy, likely adenocarcinoma. The final report is pending. The common bile duct and main pancreatic duct were both dilated. ERCP was then proceeded with forstent placement. Multiple attempts were made tocannulate the bile duct without success. The procedure was aborted.  08/16/2017 ERCP with placement of a metal biliary stent in the common bile duct by Dr. Francella Solian at [facility].  Cycle 1 FOLFIRINOX 08/31/2017  Cycle 2 FOLFIRINOX 09/14/2017  Cycle 3 FOLFIRINOX 09/28/2017  Cycle 4 FOLFIRINOX 10/12/2017 (oxaliplatin dose reduced secondary to thrombocytopenia)  Cycle 5 FOLFIRINOX 10/26/2017  Cycle 6 FOLFIRINOX 11/09/2017  [facility]aging CTs at [facility] 11/29/2017-no definitive evidence of distant metastatic disease. 2 subcentimeter liver lesions described on prior MRI not visualized on CT. Ill-defined pancreatic head mass stable to decreased in size measuring 2.1 x 2 cm. Peripancreatic inflammatory stranding decreased  from prior. No biliary ductal dilatation. Celiac axis less than 180 degrees abutment. Common hepatic artery with greater than 180 degrees encasement; superior mesenteric artery with short segment less than 180 degrees abutment; portal vein/superior mesenteric vein with greater than 180 degrees encasement of the extrahepatic portal  vein with associated circumferential narrowing at the portomesenteric, overall substantially improved from prior examination. Now less than 180 degree abutment of the SMV. The portal vein and SMV remain patent. Splenic vein patent.  Cycle 1 gemcitabine/Abraxane 12/06/2017  Cycle 2 gemcitabine/Abraxane 12/20/2017  Cycle 3 gemcitabine/Abraxane 01/03/2018  Cycle 4 gemcitabine/Abraxane 01/17/2018  Cycle 5 gemcitabine 01/31/2018 (Abraxane held due to neuropathy)  Cycle 6 gemcitabine 02/14/2018 (Abraxane held due to neuropathy)  Cycle 7 gemcitabine 02/28/2018 (Abraxane held due to neuropathy)  CT chest/abdomen/pelvis at [facility] 03/08/2018- stable appearance of previously identified infiltrating pancreatic head mass. No CT evidence of metastatic disease.  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 Ill-defined pancreatic head mass not felt to be significantly changed.  CT chest/abdomen/pelvis 05/10/2018- ill-defined hypoattenuating pancreatic head mass and ill-defined soft tissue abutting the celiac axis with mildly increased dilatation of the main pancreatic duct. New soft tissue nodule along the right upper lobe bronchus; hypoattenuating liver lesions concerning for metastasis.  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 The stent traverses the pancreatic mass and extends into the duodenum. Increase in size of 2 liver lesions. Pancreatic mass appears similar. Indistinct tissue planes around the duodenum along with wall thickening in the gallbladder and especially the gallbladder neck.  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  Cycle 1 FOLFIRINOX 08/21/2018  Cycle 2 FOLFIRINOX 09/03/2018  Cycle 3 FOLFIRINOX 09/18/2018 2. Hypertension 3. Port-A-Cath placement 08/30/2017 4. History of elevated bilirubin-questionGilbert's syndrome 5. Mild leukopenia, thrombocytopenia-question secondary to portal hypertension/splenomegaly;09/14/2017 white count improved, platelet count stable;progressive thrombocytopenia following gemcitabine/Abraxane 6. Kidney stones 7. Fever following cycles 2 and 3 gemcitabine/Abraxane,? Fever related to gemcitabine 8.Jaundice, elevated liver enzymes and bilirubin 06/22/2018; status post ERCP 06/25/2018 with debris removed from the existing stent and a new plastic stent placed. Recurrent jaundice 07/19/2018 status post ERCP 07/20/2018 with finding that the previously placed plastic stent had migrated distally. There was debris and sludge on the stent. The previously placed stent was removed. 5 to 8 mm stricture just proximal to the existing SEMS. 2 plastic stents were placed. ERCP 08/16/2018- 2 plastic biliary stents were  removed from within the previously placed  partially covered SEMS; stricture confirmed just proximal to the previously placed SEMS, treated with placement of an 8 cm long 10 mm diameter uncovered SEMS in good position.     Disposition: Cory Kelly appears stable.  He has tolerated the FOLFIRINOX well.  He will complete cycle 3 today.  We will follow-up on the CA 19-9 from today.  The plan is to schedule a restaging CT after 4 or 5 cycles of FOLFIRINOX.  He will continue oxycodone as needed for abdominal discomfort.  The lesion at the left upper thigh appears to be an inflamed sebaceous cyst.  He will contact us for a fever or increased erythema in this area.    Cory Kelly will return for an office visit and chemotherapy in 2 weeks. Betsy Coder, MD  09/18/2018  11:45 AM

## 2018-09-18 NOTE — Patient Instructions (Signed)
[facility] Discharge Instructions for Patients Receiving Chemotherapy  Today you received the following chemotherapy agents Oxaliplatin (ELOXATIN), Leucovorin, Irinotecan (CAMPTOSAR) & Fluorouracil (ADRUCIL).  To help prevent nausea and vomiting after your treatment, we encourage you to take your nausea medication as prescribed.DO NOT TAKE ZOFRAN FOR THREE DAYS AFTER TREATMENT.   If you develop nausea and vomiting that is not controlled by your nausea medication, call the clinic.   BELOW ARE SYMPTOMS THAT SHOULD BE REPORTED IMMEDIATELY:  *FEVER GREATER THAN 100.5 F  *CHILLS WITH OR WITHOUT FEVER  NAUSEA AND VOMITING THAT IS NOT CONTROLLED WITH YOUR NAUSEA MEDICATION  *UNUSUAL SHORTNESS OF BREATH  *UNUSUAL BRUISING OR BLEEDING  TENDERNESS IN MOUTH AND THROAT WITH OR WITHOUT PRESENCE OF ULCERS  *URINARY PROBLEMS  *BOWEL PROBLEMS  UNUSUAL RASH Items with * indicate a potential emergency and should be followed up as soon as possible.  Feel free to call the clinic should you have any questions or concerns. The clinic phone number is [phone].  Please show the [facility] at check-in to the Emergency Department and triage nurse.  Oxaliplatin Injection What is this medicine? OXALIPLATIN (ox AL i PLA tin) is a chemotherapy drug. It targets fast dividing cells, like cancer cells, and causes these cells to die. This medicine is used to treat cancers of the colon and rectum, and many other cancers. This medicine may be used for other purposes; ask your health care provider or pharmacist if you have questions. COMMON BRAND NAME(S): Eloxatin What should I tell my health care provider before I take this medicine? They need to know if you have any of these conditions: -kidney disease -an unusual or allergic reaction to oxaliplatin, other chemotherapy, other medicines, foods, dyes, or preservatives -pregnant or trying to get pregnant -breast-feeding How should I  use this medicine? This drug is given as an infusion into a vein. It is administered in a hospital or clinic by a specially trained health care professional. Talk to your pediatrician regarding the use of this medicine in children. Special care may be needed. Overdosage: If you think you have taken too much of this medicine contact a poison control center or emergency room at once. NOTE: This medicine is only for you. Do not share this medicine with others. What if I miss a dose? It is important not to miss a dose. Call your doctor or health care professional if you are unable to keep an appointment. What may interact with this medicine? -medicines to increase blood counts like filgrastim, pegfilgrastim, sargramostim -probenecid -some antibiotics like amikacin, gentamicin, neomycin, polymyxin B, streptomycin, tobramycin -zalcitabine Talk to your doctor or health care professional before taking any of these medicines: -acetaminophen -aspirin -ibuprofen -ketoprofen -naproxen This list may not describe all possible interactions. Give your health care provider a list of all the medicines, herbs, non-prescription drugs, or dietary supplements you use. Also tell them if you smoke, drink alcohol, or use illegal drugs. Some items may interact with your medicine. What should I watch for while using this medicine? Your condition will be monitored carefully while you are receiving this medicine. You will need important blood work done while you are taking this medicine. This medicine can make you more sensitive to cold. Do not drink cold drinks or use ice. Cover exposed skin before coming in contact with cold temperatures or cold objects. When out in cold weather wear warm clothing and cover your mouth and nose to warm the air that goes into  your lungs. Tell your doctor if you get sensitive to the cold. This drug may make you feel generally unwell. This is not uncommon, as chemotherapy can affect healthy  cells as well as cancer cells. Report any side effects. Continue your course of treatment even though you feel ill unless your doctor tells you to stop. In some cases, you may be given additional medicines to help with side effects. Follow all directions for their use. Call your doctor or health care professional for advice if you get a fever, chills or sore throat, or other symptoms of a cold or flu. Do not treat yourself. This drug decreases your body's ability to fight infections. Try to avoid being around people who are sick. This medicine may increase your risk to bruise or bleed. Call your doctor or health care professional if you notice any unusual bleeding. Be careful brushing and flossing your teeth or using a toothpick because you may get an infection or bleed more easily. If you have any dental work done, tell your dentist you are receiving this medicine. Avoid taking products that contain aspirin, acetaminophen, ibuprofen, naproxen, or ketoprofen unless instructed by your doctor. These medicines may hide a fever. Do not become pregnant while taking this medicine. Women should inform their doctor if they wish to become pregnant or think they might be pregnant. There is a potential for serious side effects to an unborn child. Talk to your health care professional or pharmacist for more information. Do not breast-feed an infant while taking this medicine. Call your doctor or health care professional if you get diarrhea. Do not treat yourself. What side effects may I notice from receiving this medicine? Side effects that you should report to your doctor or health care professional as soon as possible: -allergic reactions like skin rash, itching or hives, swelling of the face, lips, or tongue -low blood counts - This drug may decrease the number of white blood cells, red blood cells and platelets. You may be at increased risk for infections and bleeding. -signs of infection - fever or chills,  cough, sore throat, pain or difficulty passing urine -signs of decreased platelets or bleeding - bruising, pinpoint red spots on the skin, black, tarry stools, nosebleeds -signs of decreased red blood cells - unusually weak or tired, fainting spells, lightheadedness -breathing problems -chest pain, pressure -cough -diarrhea -jaw tightness -mouth sores -nausea and vomiting -pain, swelling, redness or irritation at the injection site -pain, tingling, numbness in the hands or feet -problems with balance, talking, walking -redness, blistering, peeling or loosening of the skin, including inside the mouth -trouble passing urine or change in the amount of urine Side effects that usually do not require medical attention (report to your doctor or health care professional if they continue or are bothersome): -changes in vision -constipation -hair loss -loss of appetite -metallic taste in the mouth or changes in taste -stomach pain This list may not describe all possible side effects. Call your doctor for medical advice about side effects. You may report side effects to FDA at 1-800-FDA-1088. Where should I keep my medicine? This drug is given in a hospital or clinic and will not be stored at home. NOTE: This sheet is a summary. It may not cover all possible information. If you have questions about this medicine, talk to your doctor, pharmacist, or health care provider.  2019 Elsevier/Gold Standard (2008-01-15 17:22:47)  Leucovorin injection What is this medicine? LEUCOVORIN (loo koe VOR in) is used to prevent or treat  the harmful effects of some medicines. This medicine is used to treat anemia caused by a low amount of folic acid in the body. It is also used with 5-fluorouracil (5-FU) to treat colon cancer. This medicine may be used for other purposes; ask your health care provider or pharmacist if you have questions. What should I tell my health care provider before I take this  medicine? They need to know if you have any of these conditions: -anemia from low levels of vitamin B-12 in the blood -an unusual or allergic reaction to leucovorin, folic acid, other medicines, foods, dyes, or preservatives -pregnant or trying to get pregnant -breast-feeding How should I use this medicine? This medicine is for injection into a muscle or into a vein. It is given by a health care professional in a hospital or clinic setting. Talk to your pediatrician regarding the use of this medicine in children. Special care may be needed. Overdosage: If you think you have taken too much of this medicine contact a poison control center or emergency room at once. NOTE: This medicine is only for you. Do not share this medicine with others. What if I miss a dose? This does not apply. What may interact with this medicine? -capecitabine -fluorouracil -phenobarbital -phenytoin -primidone -trimethoprim-sulfamethoxazole This list may not describe all possible interactions. Give your health care provider a list of all the medicines, herbs, non-prescription drugs, or dietary supplements you use. Also tell them if you smoke, drink alcohol, or use illegal drugs. Some items may interact with your medicine. What should I watch for while using this medicine? Your condition will be monitored carefully while you are receiving this medicine. This medicine may increase the side effects of 5-fluorouracil, 5-FU. Tell your doctor or health care professional if you have diarrhea or mouth sores that do not get better or that get worse. What side effects may I notice from receiving this medicine? Side effects that you should report to your doctor or health care professional as soon as possible: -allergic reactions like skin rash, itching or hives, swelling of the face, lips, or tongue -breathing problems -fever, infection -mouth sores -unusual bleeding or bruising -unusually weak or tired Side effects that  usually do not require medical attention (report to your doctor or health care professional if they continue or are bothersome): -constipation or diarrhea -loss of appetite -nausea, vomiting This list may not describe all possible side effects. Call your doctor for medical advice about side effects. You may report side effects to FDA at 1-800-FDA-1088. Where should I keep my medicine? This drug is given in a hospital or clinic and will not be stored at home. NOTE: This sheet is a summary. It may not cover all possible information. If you have questions about this medicine, talk to your doctor, pharmacist, or health care provider.  2019 Elsevier/Gold Standard (2007-12-25 16:50:29)  Irinotecan injection What is this medicine? IRINOTECAN (ir in oh TEE kan ) is a chemotherapy drug. It is used to treat colon and rectal cancer. This medicine may be used for other purposes; ask your health care provider or pharmacist if you have questions. COMMON BRAND NAME(S): Camptosar What should I tell my health care provider before I take this medicine? They need to know if you have any of these conditions: -dehydration -diarrhea -infection (especially a virus infection such as chickenpox, cold sores, or herpes) -liver disease -low blood counts, like low white cell, platelet, or red cell counts -low levels of calcium, magnesium, or potassium in  the blood -recent or ongoing radiation therapy -an unusual or allergic reaction to irinotecan, other medicines, foods, dyes, or preservatives -pregnant or trying to get pregnant -breast-feeding How should I use this medicine? This drug is given as an infusion into a vein. It is administered in a hospital or clinic by a specially trained health care professional. Talk to your pediatrician regarding the use of this medicine in children. Special care may be needed. Overdosage: If you think you have taken too much of this medicine contact a poison control center or  emergency room at once. NOTE: This medicine is only for you. Do not share this medicine with others. What if I miss a dose? It is important not to miss your dose. Call your doctor or health care professional if you are unable to keep an appointment. What may interact with this medicine? This medicine may interact with the following medications: -antiviral medicines for HIV or AIDS -certain antibiotics like rifampin or rifabutin -certain medicines for fungal infections like itraconazole, ketoconazole, posaconazole, and voriconazole -certain medicines for seizures like carbamazepine, phenobarbital, phenotoin -clarithromycin -gemfibrozil -nefazodone -St. John's Wort This list may not describe all possible interactions. Give your health care provider a list of all the medicines, herbs, non-prescription drugs, or dietary supplements you use. Also tell them if you smoke, drink alcohol, or use illegal drugs. Some items may interact with your medicine. What should I watch for while using this medicine? Your condition will be monitored carefully while you are receiving this medicine. You will need important blood work done while you are taking this medicine. This drug may make you feel generally unwell. This is not uncommon, as chemotherapy can affect healthy cells as well as cancer cells. Report any side effects. Continue your course of treatment even though you feel ill unless your doctor tells you to stop. In some cases, you may be given additional medicines to help with side effects. Follow all directions for their use. You may get drowsy or dizzy. Do not drive, use machinery, or do anything that needs mental alertness until you know how this medicine affects you. Do not stand or sit up quickly, especially if you are an older patient. This reduces the risk of dizzy or fainting spells. Call your doctor or health care professional for advice if you get a fever, chills or sore throat, or other symptoms  of a cold or flu. Do not treat yourself. This drug decreases your body's ability to fight infections. Try to avoid being around people who are sick. This medicine may increase your risk to bruise or bleed. Call your doctor or health care professional if you notice any unusual bleeding. Be careful brushing and flossing your teeth or using a toothpick because you may get an infection or bleed more easily. If you have any dental work done, tell your dentist you are receiving this medicine. Avoid taking products that contain aspirin, acetaminophen, ibuprofen, naproxen, or ketoprofen unless instructed by your doctor. These medicines may hide a fever. Do not become pregnant while taking this medicine. Women should inform their doctor if they wish to become pregnant or think they might be pregnant. There is a potential for serious side effects to an unborn child. Talk to your health care professional or pharmacist for more information. Do not breast-feed an infant while taking this medicine. What side effects may I notice from receiving this medicine? Side effects that you should report to your doctor or health care professional as soon as possible: -allergic  reactions like skin rash, itching or hives, swelling of the face, lips, or tongue -chest pain -diarrhea -flushing, runny nose, sweating during infusion -low blood counts - this medicine may decrease the number of white blood cells, red blood cells and platelets. You may be at increased risk for infections and bleeding. -nausea, vomiting -pain, swelling, warmth in the leg -signs of decreased platelets or bleeding - bruising, pinpoint red spots on the skin, black, tarry stools, blood in the urine -signs of infection - fever or chills, cough, sore throat, pain or difficulty passing urine -signs of decreased red blood cells - unusually weak or tired, fainting spells, lightheadedness Side effects that usually do not require medical attention (report to  your doctor or health care professional if they continue or are bothersome): -constipation -hair loss -headache -loss of appetite -mouth sores -stomach pain This list may not describe all possible side effects. Call your doctor for medical advice about side effects. You may report side effects to FDA at 1-800-FDA-1088. Where should I keep my medicine? This drug is given in a hospital or clinic and will not be stored at home. NOTE: This sheet is a summary. It may not cover all possible information. If you have questions about this medicine, talk to your doctor, pharmacist, or health care provider.  2019 Elsevier/Gold Standard (2017-09-05 15:02:58)  Fluorouracil, 5-FU injection What is this medicine? FLUOROURACIL, 5-FU (flure oh YOOR a sil) is a chemotherapy drug. It slows the growth of cancer cells. This medicine is used to treat many types of cancer like breast cancer, colon or rectal cancer, pancreatic cancer, and stomach cancer. This medicine may be used for other purposes; ask your health care provider or pharmacist if you have questions. COMMON BRAND NAME(S): Adrucil What should I tell my health care provider before I take this medicine? They need to know if you have any of these conditions: -blood disorders -dihydropyrimidine dehydrogenase (DPD) deficiency -infection (especially a virus infection such as chickenpox, cold sores, or herpes) -kidney disease -liver disease -malnourished, poor nutrition -recent or ongoing radiation therapy -an unusual or allergic reaction to fluorouracil, other chemotherapy, other medicines, foods, dyes, or preservatives -pregnant or trying to get pregnant -breast-feeding How should I use this medicine? This drug is given as an infusion or injection into a vein. It is administered in a hospital or clinic by a specially trained health care professional. Talk to your pediatrician regarding the use of this medicine in children. Special care may be  needed. Overdosage: If you think you have taken too much of this medicine contact a poison control center or emergency room at once. NOTE: This medicine is only for you. Do not share this medicine with others. What if I miss a dose? It is important not to miss your dose. Call your doctor or health care professional if you are unable to keep an appointment. What may interact with this medicine? -allopurinol -cimetidine -dapsone -digoxin -hydroxyurea -leucovorin -levamisole -medicines for seizures like ethotoin, fosphenytoin, phenytoin -medicines to increase blood counts like filgrastim, pegfilgrastim, sargramostim -medicines that treat or prevent blood clots like warfarin, enoxaparin, and dalteparin -methotrexate -metronidazole -pyrimethamine -some other chemotherapy drugs like busulfan, cisplatin, estramustine, vinblastine -trimethoprim -trimetrexate -vaccines Talk to your doctor or health care professional before taking any of these medicines: -acetaminophen -aspirin -ibuprofen -ketoprofen -naproxen This list may not describe all possible interactions. Give your health care provider a list of all the medicines, herbs, non-prescription drugs, or dietary supplements you use. Also tell them if you smoke,  drink alcohol, or use illegal drugs. Some items may interact with your medicine. What should I watch for while using this medicine? Visit your doctor for checks on your progress. This drug may make you feel generally unwell. This is not uncommon, as chemotherapy can affect healthy cells as well as cancer cells. Report any side effects. Continue your course of treatment even though you feel ill unless your doctor tells you to stop. In some cases, you may be given additional medicines to help with side effects. Follow all directions for their use. Call your doctor or health care professional for advice if you get a fever, chills or sore throat, or other symptoms of a cold or flu. Do not  treat yourself. This drug decreases your body's ability to fight infections. Try to avoid being around people who are sick. This medicine may increase your risk to bruise or bleed. Call your doctor or health care professional if you notice any unusual bleeding. Be careful brushing and flossing your teeth or using a toothpick because you may get an infection or bleed more easily. If you have any dental work done, tell your dentist you are receiving this medicine. Avoid taking products that contain aspirin, acetaminophen, ibuprofen, naproxen, or ketoprofen unless instructed by your doctor. These medicines may hide a fever. Do not become pregnant while taking this medicine. Women should inform their doctor if they wish to become pregnant or think they might be pregnant. There is a potential for serious side effects to an unborn child. Talk to your health care professional or pharmacist for more information. Do not breast-feed an infant while taking this medicine. Men should inform their doctor if they wish to father a child. This medicine may lower sperm counts. Do not treat diarrhea with over the counter products. Contact your doctor if you have diarrhea that lasts more than 2 days or if it is severe and watery. This medicine can make you more sensitive to the sun. Keep out of the sun. If you cannot avoid being in the sun, wear protective clothing and use sunscreen. Do not use sun lamps or tanning beds/booths. What side effects may I notice from receiving this medicine? Side effects that you should report to your doctor or health care professional as soon as possible: -allergic reactions like skin rash, itching or hives, swelling of the face, lips, or tongue -low blood counts - this medicine may decrease the number of white blood cells, red blood cells and platelets. You may be at increased risk for infections and bleeding. -signs of infection - fever or chills, cough, sore throat, pain or difficulty  passing urine -signs of decreased platelets or bleeding - bruising, pinpoint red spots on the skin, black, tarry stools, blood in the urine -signs of decreased red blood cells - unusually weak or tired, fainting spells, lightheadedness -breathing problems -changes in vision -chest pain -mouth sores -nausea and vomiting -pain, swelling, redness at site where injected -pain, tingling, numbness in the hands or feet -redness, swelling, or sores on hands or feet -stomach pain -unusual bleeding Side effects that usually do not require medical attention (report to your doctor or health care professional if they continue or are bothersome): -changes in finger or toe nails -diarrhea -dry or itchy skin -hair loss -headache -loss of appetite -sensitivity of eyes to the light -stomach upset -unusually teary eyes This list may not describe all possible side effects. Call your doctor for medical advice about side effects. You may report side effects  to FDA at 1-800-FDA-1088. Where should I keep my medicine? This drug is given in a hospital or clinic and will not be stored at home. NOTE: This sheet is a summary. It may not cover all possible information. If you have questions about this medicine, talk to your doctor, pharmacist, or health care provider.  2019 Elsevier/Gold Standard (2007-10-24 13:53:16)

## 2018-09-18 NOTE — Progress Notes (Signed)
Dr. Benay Spice reviewed CMP results: OK to treat with ALT/AST elevations.

## 2018-09-19 DIAGNOSIS — C250 Malignant neoplasm of head of pancreas: Secondary | ICD-10-CM | POA: Diagnosis not present

## 2018-09-19 LAB — CANCER ANTIGEN 19-9: CA 19-9: 10932 U/mL — ABNORMAL HIGH (ref 0–35)

## 2018-09-20 ENCOUNTER — Inpatient Hospital Stay: Payer: BLUE CROSS/BLUE SHIELD

## 2018-09-20 ENCOUNTER — Telehealth: Payer: Self-pay | Admitting: Oncology

## 2018-09-20 ENCOUNTER — Other Ambulatory Visit: Payer: Self-pay

## 2018-09-20 VITALS — BP 122/70 | HR 53 | Temp 98.7°F | Resp 18

## 2018-09-20 DIAGNOSIS — C250 Malignant neoplasm of head of pancreas: Secondary | ICD-10-CM

## 2018-09-20 DIAGNOSIS — Z5189 Encounter for other specified aftercare: Secondary | ICD-10-CM | POA: Diagnosis not present

## 2018-09-20 DIAGNOSIS — Z5111 Encounter for antineoplastic chemotherapy: Secondary | ICD-10-CM | POA: Diagnosis not present

## 2018-09-20 DIAGNOSIS — C787 Secondary malignant neoplasm of liver and intrahepatic bile duct: Secondary | ICD-10-CM | POA: Diagnosis not present

## 2018-09-20 LAB — URINALYSIS, COMPLETE (UACMP) WITH MICROSCOPIC
Bilirubin Urine: NEGATIVE
Glucose, UA: 50 mg/dL — AB
Hgb urine dipstick: NEGATIVE
Ketones, ur: NEGATIVE mg/dL
Leukocytes,Ua: NEGATIVE
Nitrite: NEGATIVE
PH: 7 (ref 5.0–8.0)
Protein, ur: NEGATIVE mg/dL
SPECIFIC GRAVITY, URINE: 1.013 (ref 1.005–1.030)

## 2018-09-20 MED ORDER — PEGFILGRASTIM-CBQV 6 MG/0.6ML ~~LOC~~ SOSY
6.0000 mg | PREFILLED_SYRINGE | Freq: Once | SUBCUTANEOUS | Status: AC
  Administered 2018-09-20: 6 mg via SUBCUTANEOUS

## 2018-09-20 MED ORDER — PEGFILGRASTIM-CBQV 6 MG/0.6ML ~~LOC~~ SOSY
PREFILLED_SYRINGE | SUBCUTANEOUS | Status: AC
  Filled 2018-09-20: qty 0.6

## 2018-09-20 MED ORDER — HEPARIN SOD (PORK) LOCK FLUSH 100 UNIT/ML IV SOLN
500.0000 [IU] | Freq: Once | INTRAVENOUS | Status: AC | PRN
  Administered 2018-09-20: 500 [IU]
  Filled 2018-09-20: qty 5

## 2018-09-20 MED ORDER — SODIUM CHLORIDE 0.9% FLUSH
10.0000 mL | INTRAVENOUS | Status: DC | PRN
  Administered 2018-09-20: 10 mL
  Filled 2018-09-20: qty 10

## 2018-09-20 NOTE — Telephone Encounter (Signed)
Added additional appointments for April per 3/17 los. Confirmed w/spouse.

## 2018-09-20 NOTE — Patient Instructions (Signed)
Pegfilgrastim injection  What is this medicine?  PEGFILGRASTIM (PEG fil gra stim) is a long-acting granulocyte colony-stimulating factor that stimulates the growth of neutrophils, a type of white blood cell important in the body's fight against infection. It is used to reduce the incidence of fever and infection in patients with certain types of cancer who are receiving chemotherapy that affects the bone marrow, and to increase survival after being exposed to high doses of radiation.  This medicine may be used for other purposes; ask your health care provider or pharmacist if you have questions.  COMMON BRAND NAME(S): Fulphila, Neulasta, UDENYCA  What should I tell my health care provider before I take this medicine?  They need to know if you have any of these conditions:  -kidney disease  -latex allergy  -ongoing radiation therapy  -sickle cell disease  -skin reactions to acrylic adhesives (On-Body Injector only)  -an unusual or allergic reaction to pegfilgrastim, filgrastim, other medicines, foods, dyes, or preservatives  -pregnant or trying to get pregnant  -breast-feeding  How should I use this medicine?  This medicine is for injection under the skin. If you get this medicine at home, you will be taught how to prepare and give the pre-filled syringe or how to use the On-body Injector. Refer to the patient Instructions for Use for detailed instructions. Use exactly as directed. Tell your healthcare provider immediately if you suspect that the On-body Injector may not have performed as intended or if you suspect the use of the On-body Injector resulted in a missed or partial dose.  It is important that you put your used needles and syringes in a special sharps container. Do not put them in a trash can. If you do not have a sharps container, call your pharmacist or healthcare provider to get one.  Talk to your pediatrician regarding the use of this medicine in children. While this drug may be prescribed for  selected conditions, precautions do apply.  Overdosage: If you think you have taken too much of this medicine contact a poison control center or emergency room at once.  NOTE: This medicine is only for you. Do not share this medicine with others.  What if I miss a dose?  It is important not to miss your dose. Call your doctor or health care professional if you miss your dose. If you miss a dose due to an On-body Injector failure or leakage, a new dose should be administered as soon as possible using a single prefilled syringe for manual use.  What may interact with this medicine?  Interactions have not been studied.  Give your health care provider a list of all the medicines, herbs, non-prescription drugs, or dietary supplements you use. Also tell them if you smoke, drink alcohol, or use illegal drugs. Some items may interact with your medicine.  This list may not describe all possible interactions. Give your health care provider a list of all the medicines, herbs, non-prescription drugs, or dietary supplements you use. Also tell them if you smoke, drink alcohol, or use illegal drugs. Some items may interact with your medicine.  What should I watch for while using this medicine?  You may need blood work done while you are taking this medicine.  If you are going to need a MRI, CT scan, or other procedure, tell your doctor that you are using this medicine (On-Body Injector only).  What side effects may I notice from receiving this medicine?  Side effects that you should report to   your doctor or health care professional as soon as possible:  -allergic reactions like skin rash, itching or hives, swelling of the face, lips, or tongue  -back pain  -dizziness  -fever  -pain, redness, or irritation at site where injected  -pinpoint red spots on the skin  -red or dark-brown urine  -shortness of breath or breathing problems  -stomach or side pain, or pain at the shoulder  -swelling  -tiredness  -trouble passing urine or  change in the amount of urine  Side effects that usually do not require medical attention (report to your doctor or health care professional if they continue or are bothersome):  -bone pain  -muscle pain  This list may not describe all possible side effects. Call your doctor for medical advice about side effects. You may report side effects to FDA at 1-800-FDA-1088.  Where should I keep my medicine?  Keep out of the reach of children.  If you are using this medicine at home, you will be instructed on how to store it. Throw away any unused medicine after the expiration date on the label.  NOTE: This sheet is a summary. It may not cover all possible information. If you have questions about this medicine, talk to your doctor, pharmacist, or health care provider.   2019 [employer] (2017-09-25 16:57:08)

## 2018-09-21 LAB — URINE CULTURE: Culture: NO GROWTH

## 2018-09-28 ENCOUNTER — Other Ambulatory Visit: Payer: Self-pay | Admitting: *Deleted

## 2018-10-01 ENCOUNTER — Inpatient Hospital Stay: Payer: BLUE CROSS/BLUE SHIELD

## 2018-10-01 ENCOUNTER — Inpatient Hospital Stay (HOSPITAL_BASED_OUTPATIENT_CLINIC_OR_DEPARTMENT_OTHER): Payer: BLUE CROSS/BLUE SHIELD | Admitting: Oncology

## 2018-10-01 ENCOUNTER — Other Ambulatory Visit: Payer: Self-pay | Admitting: Oncology

## 2018-10-01 ENCOUNTER — Other Ambulatory Visit: Payer: Self-pay

## 2018-10-01 VITALS — BP 118/76 | HR 66 | Temp 98.0°F | Resp 17 | Ht 75.0 in | Wt 211.0 lb

## 2018-10-01 DIAGNOSIS — C250 Malignant neoplasm of head of pancreas: Secondary | ICD-10-CM

## 2018-10-01 DIAGNOSIS — R911 Solitary pulmonary nodule: Secondary | ICD-10-CM

## 2018-10-01 DIAGNOSIS — R21 Rash and other nonspecific skin eruption: Secondary | ICD-10-CM

## 2018-10-01 DIAGNOSIS — I10 Essential (primary) hypertension: Secondary | ICD-10-CM

## 2018-10-01 DIAGNOSIS — R194 Change in bowel habit: Secondary | ICD-10-CM

## 2018-10-01 DIAGNOSIS — L299 Pruritus, unspecified: Secondary | ICD-10-CM

## 2018-10-01 DIAGNOSIS — Z5189 Encounter for other specified aftercare: Secondary | ICD-10-CM | POA: Diagnosis not present

## 2018-10-01 DIAGNOSIS — Z5111 Encounter for antineoplastic chemotherapy: Secondary | ICD-10-CM | POA: Diagnosis not present

## 2018-10-01 DIAGNOSIS — C787 Secondary malignant neoplasm of liver and intrahepatic bile duct: Secondary | ICD-10-CM

## 2018-10-01 DIAGNOSIS — R5381 Other malaise: Secondary | ICD-10-CM

## 2018-10-01 DIAGNOSIS — Z95828 Presence of other vascular implants and grafts: Secondary | ICD-10-CM

## 2018-10-01 LAB — CMP (CANCER CENTER ONLY)
ALT: 56 U/L — ABNORMAL HIGH (ref 0–44)
AST: 33 U/L (ref 15–41)
Albumin: 3 g/dL — ABNORMAL LOW (ref 3.5–5.0)
Alkaline Phosphatase: 533 U/L — ABNORMAL HIGH (ref 38–126)
Anion gap: 10 (ref 5–15)
BUN: 8 mg/dL (ref 8–23)
CO2: 27 mmol/L (ref 22–32)
Calcium: 8.7 mg/dL — ABNORMAL LOW (ref 8.9–10.3)
Chloride: 101 mmol/L (ref 98–111)
Creatinine: 0.79 mg/dL (ref 0.61–1.24)
GFR, Est AFR Am: >60 mL/min (ref >60)
GFR, Estimated: >60 mL/min (ref >60)
Glucose, Bld: 137 mg/dL — ABNORMAL HIGH (ref 70–99)
Potassium: 3.4 mmol/L — ABNORMAL LOW (ref 3.5–5.1)
Sodium: 138 mmol/L (ref 135–145)
Total Bilirubin: 0.9 mg/dL (ref 0.3–1.2)
Total Protein: 6.2 g/dL — ABNORMAL LOW (ref 6.5–8.1)

## 2018-10-01 LAB — CBC WITH DIFFERENTIAL (CANCER CENTER ONLY)
Abs Immature Granulocytes: 0.04 10*3/uL (ref 0.00–0.07)
Basophils Absolute: 0 10*3/uL (ref 0.0–0.1)
Basophils Relative: 0 %
Eosinophils Absolute: 0.1 10*3/uL (ref 0.0–0.5)
Eosinophils Relative: 2 %
HCT: 32.5 % — ABNORMAL LOW (ref 39.0–52.0)
Hemoglobin: 10.8 g/dL — ABNORMAL LOW (ref 13.0–17.0)
Immature Granulocytes: 1 %
Lymphocytes Relative: 8 %
Lymphs Abs: 0.4 10*3/uL — ABNORMAL LOW (ref 0.7–4.0)
MCH: 28.3 pg (ref 26.0–34.0)
MCHC: 33.2 g/dL (ref 30.0–36.0)
MCV: 85.1 fL (ref 80.0–100.0)
MONO ABS: 0.6 10*3/uL (ref 0.1–1.0)
Monocytes Relative: 10 %
Neutro Abs: 4.3 10*3/uL (ref 1.7–7.7)
Neutrophils Relative %: 79 %
Platelet Count: 121 10*3/uL — ABNORMAL LOW (ref 150–400)
RBC: 3.82 MIL/uL — ABNORMAL LOW (ref 4.22–5.81)
RDW: 14.3 % (ref 11.5–15.5)
WBC Count: 5.4 10*3/uL (ref 4.0–10.5)
nRBC: 0 % (ref 0.0–0.2)

## 2018-10-01 MED ORDER — POTASSIUM CHLORIDE CRYS ER 20 MEQ PO TBCR: 20.0000 meq | EXTENDED_RELEASE_TABLET | Freq: Every day | ORAL | 1 refills | Status: DC

## 2018-10-01 MED ORDER — LEUCOVORIN CALCIUM INJECTION 350 MG
300.0000 mg/m2 | Freq: Once | INTRAVENOUS | Status: AC
  Administered 2018-10-01: 676 mg via INTRAVENOUS
  Filled 2018-10-01: qty 33.8

## 2018-10-01 MED ORDER — DEXTROSE 5 % IV SOLN
Freq: Once | INTRAVENOUS | Status: AC
  Administered 2018-10-01: 11:00:00 via INTRAVENOUS
  Filled 2018-10-01: qty 250

## 2018-10-01 MED ORDER — IRINOTECAN HCL CHEMO INJECTION 100 MG/5ML
112.5000 mg/m2 | Freq: Once | INTRAVENOUS | Status: AC
  Administered 2018-10-01: 260 mg via INTRAVENOUS
  Filled 2018-10-01: qty 13

## 2018-10-01 MED ORDER — ATROPINE SULFATE 0.4 MG/ML IJ SOLN
INTRAMUSCULAR | Status: AC
  Filled 2018-10-01: qty 1

## 2018-10-01 MED ORDER — SODIUM CHLORIDE 0.9 % IV SOLN
Freq: Once | INTRAVENOUS | Status: AC
  Administered 2018-10-01: 11:00:00 via INTRAVENOUS
  Filled 2018-10-01: qty 5

## 2018-10-01 MED ORDER — DEXTROSE 5 % IV SOLN
Freq: Once | INTRAVENOUS | Status: DC
  Filled 2018-10-01: qty 250

## 2018-10-01 MED ORDER — SODIUM CHLORIDE 0.9 % IV SOLN
Freq: Once | INTRAVENOUS | Status: AC
  Administered 2018-10-01: 10:00:00 via INTRAVENOUS
  Filled 2018-10-01: qty 250

## 2018-10-01 MED ORDER — GABAPENTIN 100 MG PO CAPS: 200.0000 mg | ORAL_CAPSULE | Freq: Two times a day (BID) | ORAL | 1 refills | Status: DC

## 2018-10-01 MED ORDER — OXALIPLATIN CHEMO INJECTION 100 MG/20ML
67.0000 mg/m2 | Freq: Once | INTRAVENOUS | Status: AC
  Administered 2018-10-01: 150 mg via INTRAVENOUS
  Filled 2018-10-01: qty 10

## 2018-10-01 MED ORDER — PROCHLORPERAZINE MALEATE 10 MG PO TABS: 10.0000 mg | ORAL_TABLET | Freq: Four times a day (QID) | ORAL | 2 refills | Status: DC | PRN

## 2018-10-01 MED ORDER — SODIUM CHLORIDE 0.9 % IV SOLN
1800.0000 mg/m2 | INTRAVENOUS | Status: DC
  Administered 2018-10-01: 4050 mg via INTRAVENOUS
  Filled 2018-10-01: qty 81

## 2018-10-01 MED ORDER — PALONOSETRON HCL INJECTION 0.25 MG/5ML
0.2500 mg | Freq: Once | INTRAVENOUS | Status: AC
  Administered 2018-10-01: 0.25 mg via INTRAVENOUS

## 2018-10-01 MED ORDER — ATROPINE SULFATE 1 MG/ML IJ SOLN
0.5000 mg | Freq: Once | INTRAMUSCULAR | Status: AC | PRN
  Administered 2018-10-01: 0.5 mg via INTRAVENOUS

## 2018-10-01 MED ORDER — ATROPINE SULFATE 1 MG/ML IJ SOLN
INTRAMUSCULAR | Status: AC
  Filled 2018-10-01: qty 1

## 2018-10-01 MED ORDER — SODIUM CHLORIDE 0.9% FLUSH
10.0000 mL | Freq: Once | INTRAVENOUS | Status: AC
  Administered 2018-10-01: 10 mL
  Filled 2018-10-01: qty 10

## 2018-10-01 MED ORDER — SODIUM CHLORIDE 0.9 % IV SOLN
Freq: Once | INTRAVENOUS | Status: AC
  Administered 2018-10-01: 14:00:00 via INTRAVENOUS
  Filled 2018-10-01: qty 250

## 2018-10-01 MED ORDER — PALONOSETRON HCL INJECTION 0.25 MG/5ML
INTRAVENOUS | Status: AC
  Filled 2018-10-01: qty 5

## 2018-10-01 NOTE — Patient Instructions (Signed)
Coronavirus (COVID-19) Are you at risk?  Are you at risk for the Coronavirus (COVID-19)?  To be considered HIGH RISK for Coronavirus (COVID-19), you have to meet the following criteria:  . Traveled to [place], [place], [place], [place] or [place]; or in the [place] to [place], [place], [place], or [place]; and have fever, cough, and shortness of breath within the last 2 weeks of travel OR . Been in close contact with a person diagnosed with COVID-19 within the last 2 weeks and have fever, cough, and shortness of breath . IF YOU DO NOT MEET THESE CRITERIA, YOU ARE CONSIDERED LOW RISK FOR COVID-19.  What to do if you are HIGH RISK for COVID-19?  . If you are having a medical emergency, call 911. . Seek medical care right away. Before you go to a doctor's office, urgent care or emergency department, call ahead and tell them about your recent travel, contact with someone diagnosed with COVID-19, and your symptoms. You should receive instructions from your physician's office regarding next steps of care.  . When you arrive at healthcare provider, tell the healthcare staff immediately you have returned from visiting [place], [place], [place], [place] or [place]; or traveled in the [place] to [place], [place], [place], or [place]; in the last two weeks or you have been in close contact with a person diagnosed with COVID-19 in the last 2 weeks.   . Tell the health care staff about your symptoms: fever, cough and shortness of breath. . After you have been seen by a medical provider, you will be either: o Tested for (COVID-19) and discharged home on quarantine except to seek medical care if symptoms worsen, and asked to  - Stay home and avoid contact with others until you get your results (4-5 days)  - Avoid travel on public transportation if possible (such as bus, train, or airplane) or o Sent to the Emergency Department by EMS for evaluation, COVID-19 testing, and possible  admission depending on your condition and test results.  What to do if you are LOW RISK for COVID-19?  Reduce your risk of any infection by using the same precautions used for avoiding the common cold or flu:  . Wash your hands often with soap and warm water for at least 20 seconds.  If soap and water are not readily available, use an alcohol-based hand sanitizer with at least 60% alcohol.  . If coughing or sneezing, cover your mouth and nose by coughing or sneezing into the elbow areas of your shirt or coat, into a tissue or into your sleeve (not your hands). . Avoid shaking hands with others and consider head nods or verbal greetings only. . Avoid touching your eyes, nose, or mouth with unwashed hands.  . Avoid close contact with people who are sick. . Avoid places or events with large numbers of people in one location, like concerts or sporting events. . Carefully consider travel plans you have or are making. . If you are planning any travel outside or inside the [place], visit the CDC's Travelers' Health webpage for the latest health notices. . If you have some symptoms but not all symptoms, continue to monitor at home and seek medical attention if your symptoms worsen. . If you are having a medical emergency, call 911.   ADDITIONAL HEALTHCARE OPTIONS FOR PATIENTS  [facility] / e-Visit: [email or web address]         [facility]: [phone]  Onset   Urgent Care: [facility]: [facility] Discharge Instructions for Patients Receiving Chemotherapy  Today you received the following chemotherapy agents :  Oxaliplatin,  Irinotecan,  Leucovorin,  Fluorouracil.  To help prevent nausea and vomiting after your treatment, we encourage you to take your nausea medication as prescribed.   If you develop nausea and vomiting that is not controlled by your  nausea medication, call the clinic.   BELOW ARE SYMPTOMS THAT SHOULD BE REPORTED IMMEDIATELY:  *FEVER GREATER THAN 100.5 F  *CHILLS WITH OR WITHOUT FEVER  NAUSEA AND VOMITING THAT IS NOT CONTROLLED WITH YOUR NAUSEA MEDICATION  *UNUSUAL SHORTNESS OF BREATH  *UNUSUAL BRUISING OR BLEEDING  TENDERNESS IN MOUTH AND THROAT WITH OR WITHOUT PRESENCE OF ULCERS  *URINARY PROBLEMS  *BOWEL PROBLEMS  UNUSUAL RASH Items with * indicate a potential emergency and should be followed up as soon as possible.  Feel free to call the clinic should you have any questions or concerns. The clinic phone number is [phone].  Please show the [facility] at check-in to the Emergency Department and triage nurse.

## 2018-10-01 NOTE — Progress Notes (Signed)
[facility] OFFICE PROGRESS NOTE   Diagnosis: Pancreas cancer  INTERVAL HISTORY:   Mr. Hsiung returns for scheduled visit.  He completed another cycle of FOLFIRINOX on 09/18/2018.  No nausea or mouth sores following chemotherapy.  He has frequent bowel movements in the morning and evening, but no frank diarrhea.  No significant change in peripheral neuropathy symptoms except for intermittent numbness in the feet.  He continues to have a dry rash and pruritus at the left abdomen. His chief complaint is malaise.  He has been able to get out of the house.  He is eating multiple times per day.  Objective:  Vital signs in last 24 hours:  Blood pressure 118/76, pulse 66, temperature 98 F (36.7 C), temperature source Oral, resp. rate 17, height '6\' 3"'$  (1.905 m), weight 211 lb (95.7 kg), SpO2 100 %.    HEENT: No thrush or ulcers GI: Abdomen soft and nontender, no mass, no hepatomegaly Vascular: No leg edema  Skin: Dry fine plaque of mild erythema over the left abdomen  Portacath/PICC-without erythema  Lab Results:  Lab Results  Component Value Date   WBC 5.4 10/01/2018   HGB 10.8 (L) 10/01/2018   HCT 32.5 (L) 10/01/2018   MCV 85.1 10/01/2018   PLT 121 (L) 10/01/2018   NEUTROABS 4.3 10/01/2018    CMP  Lab Results  Component Value Date   NA 139 09/18/2018   K 3.6 09/18/2018   CL 100 09/18/2018   CO2 28 09/18/2018   GLUCOSE 143 (H) 09/18/2018   BUN 6 (L) 09/18/2018   CREATININE 0.77 09/18/2018   CALCIUM 8.4 (L) 09/18/2018   PROT 6.0 (L) 09/18/2018   ALBUMIN 2.7 (L) 09/18/2018   AST 72 (H) 09/18/2018   ALT 113 (H) 09/18/2018   ALKPHOS 584 (H) 09/18/2018   BILITOT 1.0 09/18/2018   GFRNONAA >60 09/18/2018   GFRAA >60 09/18/2018   CA 19-9 on 09/18/2018: 16,837  Medications: I have reviewed the patient's current medications.   Assessment/Plan: 1. Pancreas head mass  CT abdomen/pelvis 08/03/2017-fullness in the head of the pancreas with stranding in the  peripancreatic fat and pancreatic ductal dilatation.   MRI abdomen 08/04/2017-findings favored to represent acute on chronic pancreatitis; equivocal soft tissue fullness within the head and uncinate process of the pancreas; indeterminate too small to characterize right hepatic lobe lesion; small volume abdominal ascites; splenomegaly.   CA-19-9 elevated at 977on 08/04/2017.  Status postupper EUS 08/10/2017-findings of an irregular masslike region in the pancreatic head measuring approximately 2.7 cm. This directly abutted the main portal vein but no other major vascular structures. Biopsies obtained with preliminary cytology positive for malignancy, likely adenocarcinoma. The final report is pending. The common bile duct and main pancreatic duct were both dilated. ERCP was then proceeded with forstent placement. Multiple attempts were made tocannulate the bile duct without success. The procedure was aborted.  08/16/2017 ERCP with placement of a metal biliary stent in the common bile duct by Dr. Francella Solian at [facility].  Cycle 1 FOLFIRINOX 08/31/2017  Cycle 2 FOLFIRINOX 09/14/2017  Cycle 3 FOLFIRINOX 09/28/2017  Cycle 4 FOLFIRINOX 10/12/2017 (oxaliplatin dose reduced secondary to thrombocytopenia)  Cycle 5 FOLFIRINOX 10/26/2017  Cycle 6 FOLFIRINOX 11/09/2017  Restaging CTs at [facility] 11/29/2017-no definitive evidence of distant metastatic disease. 2 subcentimeter liver lesions described on prior MRI not visualized on CT. Ill-defined pancreatic head mass stable to decreased in size measuring 2.1 x 2 cm. Peripancreatic inflammatory stranding decreased from prior. No biliary ductal dilatation. Celiac axis less  than 180 degrees abutment. Common hepatic artery with greater than 180 degrees encasement; superior mesenteric artery with short segment less than 180 degrees abutment; portal vein/superior mesenteric vein with greater than 180 degrees encasement of the extrahepatic portal vein with associated  circumferential narrowing at the portomesenteric, overall substantially improved from prior examination. Now less than 180 degree abutment of the SMV. The portal vein and SMV remain patent. Splenic vein patent.  Cycle 1 gemcitabine/Abraxane 12/06/2017  Cycle 2 gemcitabine/Abraxane 12/20/2017  Cycle 3 gemcitabine/Abraxane 01/03/2018  Cycle 4 gemcitabine/Abraxane 01/17/2018  Cycle 5 gemcitabine 01/31/2018 (Abraxane held due to neuropathy)  Cycle 6 gemcitabine 02/14/2018 (Abraxane held due to neuropathy)  Cycle 7 gemcitabine 02/28/2018 (Abraxane held due to neuropathy)  CT chest[facility]abdomen/pelvis at [facility] 03/08/2018- stable appearance of previously identified infiltrating pancreatic head mass. No CT evidence of metastatic disease.  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 Ill-defined pancreatic head mass not felt to be significantly changed.  CT chest/abdomen/pelvis 05/10/2018- ill-defined hypoattenuating pancreatic head mass and ill-defined soft tissue abutting the celiac axis with mildly increased dilatation of the main pancreatic duct. New soft tissue nodule along the right upper lobe bronchus; hypoattenuating liver lesions concerning for metastasis.  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 The stent traverses the pancreatic mass and extends into the duodenum. Increase in size of 2 liver lesions. Pancreatic mass appears similar. Indistinct tissue planes around the duodenum along with wall thickening in the gallbladder and especially the gallbladder neck.  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  Cycle 1 FOLFIRINOX 08/21/2018  Cycle 2 FOLFIRINOX 09/03/2018  Cycle 3 FOLFIRINOX 09/18/2018  Cycle 4 FOLFIRINOX 10/01/2018 2. Hypertension 3. Port-A-Cath placement 08/30/2017 4. History of elevated bilirubin-questionGilbert's syndrome 5. Mild leukopenia, thrombocytopenia-question secondary to portal hypertension/splenomegaly;09/14/2017 white count improved, platelet count stable;progressive thrombocytopenia following gemcitabine/Abraxane 6. Kidney stones 7. Fever following cycles 2 and 3 gemcitabine/Abraxane,? Fever related to gemcitabine 8.Jaundice, elevated liver enzymes and bilirubin 06/22/2018; status post ERCP 06/25/2018 with debris removed from the existing stent and a new plastic stent placed. Recurrent jaundice 07/19/2018 status post ERCP 07/20/2018 with finding that the previously placed plastic stent had migrated distally. There was debris and sludge on the stent. The previously placed stent was removed. 5 to 8 mm stricture just proximal to the existing SEMS. 2 plastic stents were placed. ERCP 08/16/2018- 2 plastic biliary  stents were removed from within the previously placed partially covered SEMS; stricture  confirmed just proximal to the previously placed SEMS, treated with placement of an 8 cm long 10 mm diameter uncovered SEMS in good position.       Disposition: Mr. Vogan appears unchanged.  He has completed 3 cycles of salvage FOLFIRINOX chemotherapy.  The CA 19-9 was improved when he was here 2 weeks ago.  We will follow-up on the CA 19-9 from today.  The plan is to complete 5 cycles of FOLFIRI [place] prior to a restaging CT evaluation.  I encouraged him to increase his activity level and oral intake as tolerated.  We will adjust the chemotherapy dosing based on his current weight.  Mr. Beeks will return for an office visit and chemotherapy in 2 weeks.  Betsy Coder, MD  10/01/2018  9:44 AM

## 2018-10-02 ENCOUNTER — Telehealth: Payer: Self-pay | Admitting: Oncology

## 2018-10-02 LAB — CANCER ANTIGEN 19-9: CA 19-9: 4641 U/mL — ABNORMAL HIGH (ref 0–35)

## 2018-10-02 NOTE — Telephone Encounter (Signed)
Scheduled appt per 3/30 los.   

## 2018-10-03 ENCOUNTER — Other Ambulatory Visit: Payer: Self-pay

## 2018-10-03 ENCOUNTER — Inpatient Hospital Stay: Payer: BLUE CROSS/BLUE SHIELD | Attending: Nurse Practitioner

## 2018-10-03 ENCOUNTER — Other Ambulatory Visit: Payer: Self-pay | Admitting: Oncology

## 2018-10-03 VITALS — BP 112/72 | HR 65 | Temp 98.3°F | Resp 17

## 2018-10-03 DIAGNOSIS — R5381 Other malaise: Secondary | ICD-10-CM | POA: Diagnosis not present

## 2018-10-03 DIAGNOSIS — R109 Unspecified abdominal pain: Secondary | ICD-10-CM | POA: Insufficient documentation

## 2018-10-03 DIAGNOSIS — R194 Change in bowel habit: Secondary | ICD-10-CM | POA: Diagnosis not present

## 2018-10-03 DIAGNOSIS — Z5111 Encounter for antineoplastic chemotherapy: Secondary | ICD-10-CM | POA: Insufficient documentation

## 2018-10-03 DIAGNOSIS — C250 Malignant neoplasm of head of pancreas: Secondary | ICD-10-CM | POA: Diagnosis not present

## 2018-10-03 DIAGNOSIS — Z5189 Encounter for other specified aftercare: Secondary | ICD-10-CM | POA: Insufficient documentation

## 2018-10-03 DIAGNOSIS — C787 Secondary malignant neoplasm of liver and intrahepatic bile duct: Secondary | ICD-10-CM | POA: Insufficient documentation

## 2018-10-03 MED ORDER — HEPARIN SOD (PORK) LOCK FLUSH 100 UNIT/ML IV SOLN
500.0000 [IU] | Freq: Once | INTRAVENOUS | Status: AC | PRN
  Administered 2018-10-03: 14:00:00 500 [IU]
  Filled 2018-10-03: qty 5

## 2018-10-03 MED ORDER — SODIUM CHLORIDE 0.9% FLUSH
10.0000 mL | INTRAVENOUS | Status: DC | PRN
  Administered 2018-10-03: 10 mL
  Filled 2018-10-03: qty 10

## 2018-10-03 MED ORDER — PEGFILGRASTIM-CBQV 6 MG/0.6ML ~~LOC~~ SOSY
6.0000 mg | PREFILLED_SYRINGE | Freq: Once | SUBCUTANEOUS | Status: AC
  Administered 2018-10-03: 6 mg via SUBCUTANEOUS

## 2018-10-03 MED ORDER — PEGFILGRASTIM-CBQV 6 MG/0.6ML ~~LOC~~ SOSY
PREFILLED_SYRINGE | SUBCUTANEOUS | Status: AC
  Filled 2018-10-03: qty 0.6

## 2018-10-03 MED ORDER — OXYCODONE-ACETAMINOPHEN 10-325 MG PO TABS: 1.0000 | ORAL_TABLET | ORAL | 0 refills | Status: DC | PRN

## 2018-10-03 NOTE — Patient Instructions (Signed)
Pegfilgrastim injection  What is this medicine?  PEGFILGRASTIM (PEG fil gra stim) is a long-acting granulocyte colony-stimulating factor that stimulates the growth of neutrophils, a type of white blood cell important in the body's fight against infection. It is used to reduce the incidence of fever and infection in patients with certain types of cancer who are receiving chemotherapy that affects the bone marrow, and to increase survival after being exposed to high doses of radiation.  This medicine may be used for other purposes; ask your health care provider or pharmacist if you have questions.  COMMON BRAND NAME(S): Fulphila, Neulasta, UDENYCA  What should I tell my health care provider before I take this medicine?  They need to know if you have any of these conditions:  -kidney disease  -latex allergy  -ongoing radiation therapy  -sickle cell disease  -skin reactions to acrylic adhesives (On-Body Injector only)  -an unusual or allergic reaction to pegfilgrastim, filgrastim, other medicines, foods, dyes, or preservatives  -pregnant or trying to get pregnant  -breast-feeding  How should I use this medicine?  This medicine is for injection under the skin. If you get this medicine at home, you will be taught how to prepare and give the pre-filled syringe or how to use the On-body Injector. Refer to the patient Instructions for Use for detailed instructions. Use exactly as directed. Tell your healthcare provider immediately if you suspect that the On-body Injector may not have performed as intended or if you suspect the use of the On-body Injector resulted in a missed or partial dose.  It is important that you put your used needles and syringes in a special sharps container. Do not put them in a trash can. If you do not have a sharps container, call your pharmacist or healthcare provider to get one.  Talk to your pediatrician regarding the use of this medicine in children. While this drug may be prescribed for  selected conditions, precautions do apply.  Overdosage: If you think you have taken too much of this medicine contact a poison control center or emergency room at once.  NOTE: This medicine is only for you. Do not share this medicine with others.  What if I miss a dose?  It is important not to miss your dose. Call your doctor or health care professional if you miss your dose. If you miss a dose due to an On-body Injector failure or leakage, a new dose should be administered as soon as possible using a single prefilled syringe for manual use.  What may interact with this medicine?  Interactions have not been studied.  Give your health care provider a list of all the medicines, herbs, non-prescription drugs, or dietary supplements you use. Also tell them if you smoke, drink alcohol, or use illegal drugs. Some items may interact with your medicine.  This list may not describe all possible interactions. Give your health care provider a list of all the medicines, herbs, non-prescription drugs, or dietary supplements you use. Also tell them if you smoke, drink alcohol, or use illegal drugs. Some items may interact with your medicine.  What should I watch for while using this medicine?  You may need blood work done while you are taking this medicine.  If you are going to need a MRI, CT scan, or other procedure, tell your doctor that you are using this medicine (On-Body Injector only).  What side effects may I notice from receiving this medicine?  Side effects that you should report to   your doctor or health care professional as soon as possible:  -allergic reactions like skin rash, itching or hives, swelling of the face, lips, or tongue  -back pain  -dizziness  -fever  -pain, redness, or irritation at site where injected  -pinpoint red spots on the skin  -red or dark-brown urine  -shortness of breath or breathing problems  -stomach or side pain, or pain at the shoulder  -swelling  -tiredness  -trouble passing urine or  change in the amount of urine  Side effects that usually do not require medical attention (report to your doctor or health care professional if they continue or are bothersome):  -bone pain  -muscle pain  This list may not describe all possible side effects. Call your doctor for medical advice about side effects. You may report side effects to FDA at 1-800-FDA-1088.  Where should I keep my medicine?  Keep out of the reach of children.  If you are using this medicine at home, you will be instructed on how to store it. Throw away any unused medicine after the expiration date on the label.  NOTE: This sheet is a summary. It may not cover all possible information. If you have questions about this medicine, talk to your doctor, pharmacist, or health care provider.   2019 [employer] (2017-09-25 16:57:08)

## 2018-10-08 ENCOUNTER — Telehealth: Payer: Self-pay | Admitting: *Deleted

## 2018-10-08 ENCOUNTER — Other Ambulatory Visit: Payer: Self-pay

## 2018-10-08 ENCOUNTER — Telehealth: Payer: Self-pay | Admitting: Oncology

## 2018-10-08 ENCOUNTER — Inpatient Hospital Stay: Payer: BLUE CROSS/BLUE SHIELD

## 2018-10-08 ENCOUNTER — Inpatient Hospital Stay (HOSPITAL_BASED_OUTPATIENT_CLINIC_OR_DEPARTMENT_OTHER): Payer: BLUE CROSS/BLUE SHIELD | Admitting: Oncology

## 2018-10-08 VITALS — BP 135/78 | HR 78 | Temp 98.6°F | Resp 18 | Ht 75.0 in | Wt 207.1 lb

## 2018-10-08 DIAGNOSIS — Z5189 Encounter for other specified aftercare: Secondary | ICD-10-CM | POA: Diagnosis not present

## 2018-10-08 DIAGNOSIS — R911 Solitary pulmonary nodule: Secondary | ICD-10-CM | POA: Diagnosis not present

## 2018-10-08 DIAGNOSIS — I10 Essential (primary) hypertension: Secondary | ICD-10-CM | POA: Diagnosis not present

## 2018-10-08 DIAGNOSIS — R5381 Other malaise: Secondary | ICD-10-CM | POA: Diagnosis not present

## 2018-10-08 DIAGNOSIS — R109 Unspecified abdominal pain: Secondary | ICD-10-CM

## 2018-10-08 DIAGNOSIS — C250 Malignant neoplasm of head of pancreas: Secondary | ICD-10-CM

## 2018-10-08 DIAGNOSIS — C787 Secondary malignant neoplasm of liver and intrahepatic bile duct: Secondary | ICD-10-CM

## 2018-10-08 DIAGNOSIS — Z5111 Encounter for antineoplastic chemotherapy: Secondary | ICD-10-CM | POA: Diagnosis not present

## 2018-10-08 DIAGNOSIS — Z95828 Presence of other vascular implants and grafts: Secondary | ICD-10-CM

## 2018-10-08 DIAGNOSIS — R194 Change in bowel habit: Secondary | ICD-10-CM

## 2018-10-08 LAB — CMP (CANCER CENTER ONLY)
ALT: 74 U/L — ABNORMAL HIGH (ref 0–44)
AST: 46 U/L — ABNORMAL HIGH (ref 15–41)
Albumin: 3 g/dL — ABNORMAL LOW (ref 3.5–5.0)
Alkaline Phosphatase: 521 U/L — ABNORMAL HIGH (ref 38–126)
Anion gap: 7 (ref 5–15)
BUN: 6 mg/dL — ABNORMAL LOW (ref 8–23)
CO2: 27 mmol/L (ref 22–32)
Calcium: 8.3 mg/dL — ABNORMAL LOW (ref 8.9–10.3)
Chloride: 97 mmol/L — ABNORMAL LOW (ref 98–111)
Creatinine: 0.78 mg/dL (ref 0.61–1.24)
GFR, Est AFR Am: >60 mL/min (ref >60)
GFR, Estimated: >60 mL/min (ref >60)
Glucose, Bld: 103 mg/dL — ABNORMAL HIGH (ref 70–99)
Potassium: 3.4 mmol/L — ABNORMAL LOW (ref 3.5–5.1)
Sodium: 131 mmol/L — ABNORMAL LOW (ref 135–145)
Total Bilirubin: 0.9 mg/dL (ref 0.3–1.2)
Total Protein: 6.1 g/dL — ABNORMAL LOW (ref 6.5–8.1)

## 2018-10-08 LAB — MAGNESIUM: Magnesium: 1.6 mg/dL — ABNORMAL LOW (ref 1.7–2.4)

## 2018-10-08 MED ORDER — SODIUM CHLORIDE 0.9% FLUSH
10.0000 mL | Freq: Once | INTRAVENOUS | Status: DC
  Filled 2018-10-08: qty 10

## 2018-10-08 MED ORDER — SODIUM CHLORIDE 0.9 % IV SOLN
INTRAVENOUS | Status: DC
  Administered 2018-10-08: 15:00:00 via INTRAVENOUS
  Filled 2018-10-08 (×2): qty 250

## 2018-10-08 MED ORDER — POTASSIUM CHLORIDE CRYS ER 20 MEQ PO TBCR
40.0000 meq | EXTENDED_RELEASE_TABLET | Freq: Once | ORAL | Status: AC
  Administered 2018-10-08: 17:00:00 40 meq via ORAL

## 2018-10-08 MED ORDER — SODIUM CHLORIDE 0.9% FLUSH
10.0000 mL | INTRAVENOUS | Status: DC | PRN
  Administered 2018-10-08 (×2): 10 mL via INTRAVENOUS
  Filled 2018-10-08: qty 10

## 2018-10-08 MED ORDER — POTASSIUM CHLORIDE CRYS ER 20 MEQ PO TBCR
EXTENDED_RELEASE_TABLET | ORAL | Status: AC
  Filled 2018-10-08: qty 2

## 2018-10-08 MED ORDER — HEPARIN SOD (PORK) LOCK FLUSH 100 UNIT/ML IV SOLN
500.0000 [IU] | Freq: Once | INTRAVENOUS | Status: AC
  Administered 2018-10-08: 500 [IU]
  Filled 2018-10-08: qty 5

## 2018-10-08 NOTE — Telephone Encounter (Signed)
Return call to "Dez Stauffer [phone]).  Not feeling well past five days.  Loosing weight again; weighed 199.5 this morning.  Need advice what to do."  "I need something for diarrhea, energy and appetite.  Perhaps I'm not taking enough of the OTC anti-diarrheal imodium.  Go to the bathroom, take two steps away and back again.  Probably emptying five to six pounds of stool.  Lots of gas before bowels move.  Stomach in knots.  Emptied five times today.  Some blood on tissue. Unable to do anything but lie in bed I'm so washed out.  Want to do things but can't.  Nothing tastes good.  Is there something I can drink?"

## 2018-10-08 NOTE — Telephone Encounter (Signed)
Per 4/6 los appts already scheduled. °

## 2018-10-08 NOTE — Progress Notes (Signed)
[facility] OFFICE PROGRESS NOTE   Diagnosis: Pancreas cancer  INTERVAL HISTORY:   [name] returns prior to his scheduled visit.  He completed another cycle of FOLFIRINOX beginning 10/01/2018.  He received G-CSF on 10/03/2018. Mr. Sem reports having bowel movements approximately every hour over the weekend.  The stool is sometimes formed.  He had a few episodes of nausea, but no emesis.  He reports discomfort in the abdomen.  He takes oxycodone occasionally.  No fever, dyspnea, mouth sores, or cough.  He had increased tingling in the extremities following chemotherapy last week, this has resolved.  He reports drinking adequate amounts of fluid.  [name] complains of malaise and somnolence.  Objective:  Vital signs in last 24 hours:  Blood pressure 135/78, pulse 78, temperature 98.6 F (37 C), temperature source Oral, resp. rate 18, height 6' 3" (1.905 m), weight 207 lb 1.6 oz (93.9 kg), SpO2 98 %.    HEENT: The mucous membranes are moist Resp: Lungs clear bilaterally, no respiratory distress Cardio: Regular rate and rhythm GI: Soft, no mass, no hepatomegaly, he felt like he needed to have a bowel movement after I examined the abdomen Vascular: No leg edema  Skin: Palms without erythema  Portacath/PICC-without erythema  Lab Results:  Lab Results  Component Value Date   WBC 5.4 10/01/2018   HGB 10.8 (L) 10/01/2018   HCT 32.5 (L) 10/01/2018   MCV 85.1 10/01/2018   PLT 121 (L) 10/01/2018   NEUTROABS 4.3 10/01/2018    CMP  Lab Results  Component Value Date   NA 138 10/01/2018   K 3.4 (L) 10/01/2018   CL 101 10/01/2018   CO2 27 10/01/2018   GLUCOSE 137 (H) 10/01/2018   BUN 8 10/01/2018   CREATININE 0.79 10/01/2018   CALCIUM 8.7 (L) 10/01/2018   PROT 6.2 (L) 10/01/2018   ALBUMIN 3.0 (L) 10/01/2018   AST 33 10/01/2018   ALT 56 (H) 10/01/2018   ALKPHOS 533 (H) 10/01/2018   BILITOT 0.9 10/01/2018   GFRNONAA >60 10/01/2018   GFRAA >60  10/01/2018     Medications: I have reviewed the patient's current medications.   Assessment/Plan: 1. Pancreas head mass  CT abdomen/pelvis 08/03/2017-fullness in the head of the pancreas with stranding in the peripancreatic fat and pancreatic ductal dilatation.   MRI abdomen 08/04/2017-findings favored to represent acute on chronic pancreatitis; equivocal soft tissue fullness within the head and uncinate process of the pancreas; indeterminate too small to characterize right hepatic lobe lesion; small volume abdominal ascites; splenomegaly.   CA-19-9 elevated at 977on 08/04/2017.  Status postupper EUS 08/10/2017-findings of an irregular masslike region in the pancreatic head measuring approximately 2.7 cm. This directly abutted the main portal vein but no other major vascular structures. Biopsies obtained with preliminary cytology positive for malignancy, likely adenocarcinoma. The final report is pending. The common bile duct and main pancreatic duct were both dilated. ERCP was then proceeded with forstent placement. Multiple attempts were made tocannulate the bile duct without success. The procedure was aborted.  08/16/2017 ERCP with placement of a metal biliary stent in the common bile duct by Dr. Francella Solian at [facility].  Cycle 1 FOLFIRINOX 08/31/2017  Cycle 2 FOLFIRINOX 09/14/2017  Cycle 3 FOLFIRINOX 09/28/2017  Cycle 4 FOLFIRINOX 10/12/2017 (oxaliplatin dose reduced secondary to thrombocytopenia)  Cycle 5 FOLFIRINOX 10/26/2017  Cycle 6 FOLFIRINOX 11/09/2017  Restaging CTs at [facility] 11/29/2017-no definitive evidence of distant metastatic disease. 2 subcentimeter liver lesions described on prior MRI not visualized on  CT. Ill-defined pancreatic head mass stable to decreased in size measuring 2.1 x 2 cm. Peripancreatic inflammatory stranding decreased from prior. No biliary ductal dilatation. Celiac axis less than 180 degrees abutment. Common hepatic artery with greater than 180 degrees  encasement; superior mesenteric artery with short segment less than 180 degrees abutment; portal vein/superior mesenteric vein with greater than 180 degrees encasement of the extrahepatic portal vein with associated circumferential narrowing at the portomesenteric, overall substantially improved from prior examination. Now less than 180 degree abutment of the SMV. The portal vein and SMV remain patent. Splenic vein patent.  Cycle 1 gemcitabine/Abraxane 12/06/2017  Cycle 2 gemcitabine/Abraxane 12/20/2017  Cycle 3 gemcitabine/Abraxane 01/03/2018  Cycle 4 gemcitabine/Abraxane 01/17/2018  Cycle 5 gemcitabine 01/31/2018 (Abraxane held due to neuropathy)  Cycle 6 gemcitabine 02/14/2018 (Abraxane held due to neuropathy)  Cycle 7 gemcitabine 02/28/2018 (Abraxane held due to neuropathy)  CT chest/abdomen/pelvis at [facility] 03/08/2018- stable appearance of previously identified infiltrating pancreatic head mass. No CT evidence of metastatic disease.  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 Ill-defined pancreatic head mass not felt to be significantly changed.  CT chest/abdomen/pelvis 05/10/2018- ill-defined hypoattenuating pancreatic head mass and ill-defined soft tissue abutting the celiac axis with mildly increased dilatation of the main pancreatic duct. New soft tissue nodule along the right upper lobe bronchus; hypoattenuating liver lesions concerning for metastasis.  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 The stent traverses the pancreatic mass and extends into the duodenum. Increase in size of 2 liver lesions. Pancreatic mass appears similar. Indistinct tissue planes around the duodenum along with wall thickening in the gallbladder and especially the gallbladder neck.  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  Cycle 1 FOLFIRINOX 08/21/2018  Cycle 2 FOLFIRINOX 09/03/2018  Cycle 3 FOLFIRINOX 09/18/2018  Cycle 4 FOLFIRINOX 10/01/2018 2. Hypertension 3. Port-A-Cath placement 08/30/2017 4. History of elevated bilirubin-questionGilbert's syndrome 5. Mild leukopenia, thrombocytopenia-question secondary to portal hypertension/splenomegaly;09/14/2017 white count improved, platelet count stable;progressive thrombocytopenia following gemcitabine/Abraxane 6. Kidney stones 7. Fever following cycles 2 and 3 gemcitabine/Abraxane,? Fever related to gemcitabine 8.Jaundice, elevated liver enzymes and bilirubin 06/22/2018; status post ERCP 06/25/2018 with debris removed from the existing stent and a new plastic stent placed. Recurrent jaundice 07/19/2018 status post ERCP 07/20/2018 with finding that the previously placed plastic stent had migrated  distally. There was debris and sludge on the stent. The previously placed stent was removed. 5 to 8 mm stricture  just proximal to the existing SEMS. 2 plastic stents were placed. ERCP 08/16/2018- 2 plastic biliary stents were removed from within the previously placed partially covered SEMS; stricture confirmed just proximal to the previously placed SEMS, treated with placement of an 8 cm long 10 mm diameter uncovered SEMS in good position.      Disposition: Mr. Wion is at day 8 following the most recent cycle of FOLFIRINOX.  He presents with malaise, frequent bowel movements, and abdominal discomfort.  I suspect his symptoms are related to toxicity from chemotherapy.  He appears to have GI toxicity and asthenia from chemotherapy.  He will continue Imodium.  He reports taking 3 Imodium tablets per day.  I recommended he increase the Imodium up to 8 tablets/day.  He will let us know if this does not help with the stool frequency.  He will push fluids and increase his diet as tolerated.  We will check a chemistry panel and he will receive intravenous fluids today.  He will let us know if his status has not improved over the next few days.  Betsy Coder, MD  10/08/2018  2:46 PM

## 2018-10-08 NOTE — Telephone Encounter (Signed)
Left VM that "stomach is in knots" and feels terrible. In speaking with patient, he has experienced abdominal cramping, goes to bathroom almost every hour to have loose bowel movement despite Imodium. Wants to sleep all the time. Feels weaker than he has ever felt. Can't eat and losing weight. Wt. Today 199.5 lb which is lowest he has ever been. Per Dr. Benay Spice: come to office now.

## 2018-10-08 NOTE — Patient Instructions (Signed)
Dehydration, Adult  Dehydration is when there is not enough fluid or water in your body. This happens when you lose more fluids than you take in. Dehydration can range from mild to very bad. It should be treated right away to keep it from getting very bad. Symptoms of mild dehydration may include:  Thirst.  Dry lips.  Slightly dry mouth.  Dry, warm skin.  Dizziness. Symptoms of moderate dehydration may include:  Very dry mouth.  Muscle cramps.  Dark pee (urine). Pee may be the color of tea.  Your body making less pee.  Your eyes making fewer tears.  Heartbeat that is uneven or faster than normal (palpitations).  Headache.  Light-headedness, especially when you stand up from sitting.  Fainting (syncope). Symptoms of very bad dehydration may include:  Changes in skin, such as: ? Cold and clammy skin. ? Blotchy (mottled) or pale skin. ? Skin that does not quickly return to normal after being lightly pinched and let go (poor skin turgor).  Changes in body fluids, such as: ? Feeling very thirsty. ? Your eyes making fewer tears. ? Not sweating when body temperature is high, such as in hot weather. ? Your body making very little pee.  Changes in vital signs, such as: ? Weak pulse. ? Pulse that is more than 100 beats a minute when you are sitting still. ? Fast breathing. ? Low blood pressure.  Other changes, such as: ? Sunken eyes. ? Cold hands and feet. ? Confusion. ? Lack of energy (lethargy). ? Trouble waking up from sleep. ? Short-term weight loss. ? Unconsciousness. Follow these instructions at home:   If told by your doctor, drink an ORS: ? Make an ORS by using instructions on the package. ? Start by drinking small amounts, about  cup (120 mL) every 5-10 minutes. ? Slowly drink more until you have had the amount that your doctor said to have.  Drink enough clear fluid to keep your pee clear or pale yellow. If you were told to drink an ORS, finish the  ORS first, then start slowly drinking clear fluids. Drink fluids such as: ? Water. Do not drink only water by itself. Doing that can make the salt (sodium) level in your body get too low (hyponatremia). ? Ice chips. ? Fruit juice that you have added water to (diluted). ? Low-calorie sports drinks.  Avoid: ? Alcohol. ? Drinks that have a lot of sugar. These include high-calorie sports drinks, fruit juice that does not have water added, and soda. ? Caffeine. ? Foods that are greasy or have a lot of fat or sugar.  Take over-the-counter and prescription medicines only as told by your doctor.  Do not take salt tablets. Doing that can make the salt level in your body get too high (hypernatremia).  Eat foods that have minerals (electrolytes). Examples include bananas, oranges, potatoes, tomatoes, and spinach.  Keep all follow-up visits as told by your doctor. This is important. Contact a doctor if:  You have belly (abdominal) pain that: ? Gets worse. ? Stays in one area (localizes).  You have a rash.  You have a stiff neck.  You get angry or annoyed more easily than normal (irritability).  You are more sleepy than normal.  You have a harder time waking up than normal.  You feel: ? Weak. ? Dizzy. ? Very thirsty.  You have peed (urinated) only a small amount of very dark pee during 6-8 hours. Get help right away if:  You have   symptoms of very bad dehydration.  You cannot drink fluids without throwing up (vomiting).  Your symptoms get worse with treatment.  You have a fever.  You have a very bad headache.  You are throwing up or having watery poop (diarrhea) and it: ? Gets worse. ? Does not go away.  You have blood or something green (bile) in your throw-up.  You have blood in your poop (stool). This may cause poop to look black and tarry.  You have not peed in 6-8 hours.  You pass out (faint).  Your heart rate when you are sitting still is more than 100 beats a  minute.  You have trouble breathing. This information is not intended to replace advice given to you by your health care provider. Make sure you discuss any questions you have with your health care provider. Document Released: 04/16/2009 Document Revised: 01/08/2016 Document Reviewed: 08/14/2015 Elsevier Interactive Patient Education  2019 [employer]  

## 2018-10-09 ENCOUNTER — Telehealth: Payer: Self-pay | Admitting: *Deleted

## 2018-10-09 DIAGNOSIS — C250 Malignant neoplasm of head of pancreas: Secondary | ICD-10-CM

## 2018-10-09 NOTE — Telephone Encounter (Addendum)
Has had #5 "loose stools" since he was in clinic yesterday. Has taken #6 Imodium since he was here. No nausea and able to drink fluids, wife reports he could do more. He is taking 3-4 Percocet/day to stay comfortable. Are asking about putting him on armodafinil (narcolepsy drug) to help with his energy/fatigue. Reports of hearing of someone with cancer that this helped.  Per Dr. Benay Spice: continue Imodium up to 8/day and push fluids. Try alternating the Percocet and Aleve. The Percocet may be making him sleepy and fatigued. Does not want patient to take the armodafinil. Patient notified and will follow suggestions.

## 2018-10-14 ENCOUNTER — Other Ambulatory Visit: Payer: Self-pay | Admitting: Oncology

## 2018-10-16 ENCOUNTER — Other Ambulatory Visit: Payer: Self-pay

## 2018-10-16 ENCOUNTER — Inpatient Hospital Stay: Payer: BLUE CROSS/BLUE SHIELD

## 2018-10-16 ENCOUNTER — Inpatient Hospital Stay (HOSPITAL_BASED_OUTPATIENT_CLINIC_OR_DEPARTMENT_OTHER): Payer: BLUE CROSS/BLUE SHIELD | Admitting: Oncology

## 2018-10-16 ENCOUNTER — Telehealth: Payer: Self-pay | Admitting: Oncology

## 2018-10-16 VITALS — BP 133/90 | HR 79 | Temp 98.2°F | Resp 19 | Ht 75.0 in | Wt 208.2 lb

## 2018-10-16 DIAGNOSIS — R911 Solitary pulmonary nodule: Secondary | ICD-10-CM

## 2018-10-16 DIAGNOSIS — M545 Low back pain: Secondary | ICD-10-CM | POA: Diagnosis not present

## 2018-10-16 DIAGNOSIS — Z95828 Presence of other vascular implants and grafts: Secondary | ICD-10-CM

## 2018-10-16 DIAGNOSIS — C787 Secondary malignant neoplasm of liver and intrahepatic bile duct: Secondary | ICD-10-CM | POA: Diagnosis not present

## 2018-10-16 DIAGNOSIS — R109 Unspecified abdominal pain: Secondary | ICD-10-CM

## 2018-10-16 DIAGNOSIS — R194 Change in bowel habit: Secondary | ICD-10-CM | POA: Diagnosis not present

## 2018-10-16 DIAGNOSIS — C250 Malignant neoplasm of head of pancreas: Secondary | ICD-10-CM

## 2018-10-16 DIAGNOSIS — R5381 Other malaise: Secondary | ICD-10-CM | POA: Diagnosis not present

## 2018-10-16 DIAGNOSIS — R197 Diarrhea, unspecified: Secondary | ICD-10-CM

## 2018-10-16 DIAGNOSIS — Z5111 Encounter for antineoplastic chemotherapy: Secondary | ICD-10-CM | POA: Diagnosis not present

## 2018-10-16 DIAGNOSIS — I10 Essential (primary) hypertension: Secondary | ICD-10-CM

## 2018-10-16 DIAGNOSIS — Z5189 Encounter for other specified aftercare: Secondary | ICD-10-CM | POA: Diagnosis not present

## 2018-10-16 LAB — CBC WITH DIFFERENTIAL (CANCER CENTER ONLY)
Abs Immature Granulocytes: 0.08 10*3/uL — ABNORMAL HIGH (ref 0.00–0.07)
Basophils Absolute: 0 10*3/uL (ref 0.0–0.1)
Basophils Relative: 0 %
Eosinophils Absolute: 0 10*3/uL (ref 0.0–0.5)
Eosinophils Relative: 0 %
HCT: 32.7 % — ABNORMAL LOW (ref 39.0–52.0)
Hemoglobin: 11 g/dL — ABNORMAL LOW (ref 13.0–17.0)
Immature Granulocytes: 1 %
Lymphocytes Relative: 5 %
Lymphs Abs: 0.3 10*3/uL — ABNORMAL LOW (ref 0.7–4.0)
MCH: 28.5 pg (ref 26.0–34.0)
MCHC: 33.6 g/dL (ref 30.0–36.0)
MCV: 84.7 fL (ref 80.0–100.0)
Monocytes Absolute: 0.5 10*3/uL (ref 0.1–1.0)
Monocytes Relative: 7 %
Neutro Abs: 5.9 10*3/uL (ref 1.7–7.7)
Neutrophils Relative %: 87 %
Platelet Count: 168 10*3/uL (ref 150–400)
RBC: 3.86 MIL/uL — ABNORMAL LOW (ref 4.22–5.81)
RDW: 14.6 % (ref 11.5–15.5)
WBC Count: 6.8 10*3/uL (ref 4.0–10.5)
nRBC: 0 % (ref 0.0–0.2)

## 2018-10-16 LAB — CMP (CANCER CENTER ONLY)
ALT: 38 U/L (ref 0–44)
AST: 29 U/L (ref 15–41)
Albumin: 2.9 g/dL — ABNORMAL LOW (ref 3.5–5.0)
Alkaline Phosphatase: 504 U/L — ABNORMAL HIGH (ref 38–126)
Anion gap: 12 (ref 5–15)
BUN: 6 mg/dL — ABNORMAL LOW (ref 8–23)
CO2: 26 mmol/L (ref 22–32)
Calcium: 8.4 mg/dL — ABNORMAL LOW (ref 8.9–10.3)
Chloride: 99 mmol/L (ref 98–111)
Creatinine: 0.75 mg/dL (ref 0.61–1.24)
GFR, Est AFR Am: >60 mL/min (ref >60)
GFR, Estimated: >60 mL/min (ref >60)
Glucose, Bld: 156 mg/dL — ABNORMAL HIGH (ref 70–99)
Potassium: 2.9 mmol/L — CL (ref 3.5–5.1)
Sodium: 137 mmol/L (ref 135–145)
Total Bilirubin: 0.6 mg/dL (ref 0.3–1.2)
Total Protein: 6.1 g/dL — ABNORMAL LOW (ref 6.5–8.1)

## 2018-10-16 LAB — MAGNESIUM: Magnesium: 1.6 mg/dL — ABNORMAL LOW (ref 1.7–2.4)

## 2018-10-16 MED ORDER — DEXTROSE 5 % IV SOLN
Freq: Once | INTRAVENOUS | Status: AC
  Administered 2018-10-16: 10:00:00 via INTRAVENOUS
  Filled 2018-10-16: qty 250

## 2018-10-16 MED ORDER — POTASSIUM CHLORIDE CRYS ER 20 MEQ PO TBCR
40.0000 meq | EXTENDED_RELEASE_TABLET | Freq: Once | ORAL | Status: AC
  Administered 2018-10-16: 40 meq via ORAL

## 2018-10-16 MED ORDER — SODIUM CHLORIDE 0.9 % IV SOLN
Freq: Once | INTRAVENOUS | Status: AC
  Administered 2018-10-16: 13:00:00 via INTRAVENOUS
  Filled 2018-10-16: qty 250

## 2018-10-16 MED ORDER — SODIUM CHLORIDE 0.9 % IV SOLN
90.0000 mg/m2 | Freq: Once | INTRAVENOUS | Status: AC
  Administered 2018-10-16: 200 mg via INTRAVENOUS
  Filled 2018-10-16: qty 10

## 2018-10-16 MED ORDER — POTASSIUM CHLORIDE CRYS ER 20 MEQ PO TBCR: 20.0000 meq | EXTENDED_RELEASE_TABLET | Freq: Two times a day (BID) | ORAL | 1 refills | Status: DC

## 2018-10-16 MED ORDER — SODIUM CHLORIDE 0.9 % IV SOLN
Freq: Once | INTRAVENOUS | Status: AC
  Administered 2018-10-16: 10:00:00 via INTRAVENOUS
  Filled 2018-10-16: qty 5

## 2018-10-16 MED ORDER — ATROPINE SULFATE 1 MG/ML IJ SOLN
0.5000 mg | Freq: Once | INTRAMUSCULAR | Status: AC | PRN
  Administered 2018-10-16: 0.5 mg via INTRAVENOUS

## 2018-10-16 MED ORDER — OXYCODONE-ACETAMINOPHEN 10-325 MG PO TABS: 1.0000 | ORAL_TABLET | ORAL | 0 refills | Status: DC | PRN

## 2018-10-16 MED ORDER — OXALIPLATIN CHEMO INJECTION 100 MG/20ML
66.0000 mg/m2 | Freq: Once | INTRAVENOUS | Status: AC
  Administered 2018-10-16: 150 mg via INTRAVENOUS
  Filled 2018-10-16: qty 20

## 2018-10-16 MED ORDER — ATROPINE SULFATE 1 MG/ML IJ SOLN
INTRAMUSCULAR | Status: AC
  Filled 2018-10-16: qty 1

## 2018-10-16 MED ORDER — SODIUM CHLORIDE 0.9 % IV SOLN
300.0000 mg/m2 | Freq: Once | INTRAVENOUS | Status: AC
  Administered 2018-10-16: 676 mg via INTRAVENOUS
  Filled 2018-10-16: qty 33.8

## 2018-10-16 MED ORDER — SODIUM CHLORIDE 0.9 % IV SOLN
Freq: Once | INTRAVENOUS | Status: AC
  Administered 2018-10-16: 10:00:00 via INTRAVENOUS
  Filled 2018-10-16: qty 250

## 2018-10-16 MED ORDER — LEUCOVORIN CALCIUM INJECTION 350 MG: 300.0000 mg/m2 | Freq: Once | INTRAVENOUS | Status: DC

## 2018-10-16 MED ORDER — IRINOTECAN HCL CHEMO INJECTION 100 MG/5ML: 90.0000 mg/m2 | Freq: Once | INTRAVENOUS | Status: DC

## 2018-10-16 MED ORDER — PALONOSETRON HCL INJECTION 0.25 MG/5ML
INTRAVENOUS | Status: AC
  Filled 2018-10-16: qty 5

## 2018-10-16 MED ORDER — POTASSIUM CHLORIDE CRYS ER 20 MEQ PO TBCR
EXTENDED_RELEASE_TABLET | ORAL | Status: AC
  Filled 2018-10-16: qty 2

## 2018-10-16 MED ORDER — SODIUM CHLORIDE 0.9 % IV SOLN
1800.0000 mg/m2 | INTRAVENOUS | Status: DC
  Administered 2018-10-16: 4050 mg via INTRAVENOUS
  Filled 2018-10-16: qty 81

## 2018-10-16 MED ORDER — PALONOSETRON HCL INJECTION 0.25 MG/5ML
0.2500 mg | Freq: Once | INTRAVENOUS | Status: AC
  Administered 2018-10-16: 0.25 mg via INTRAVENOUS

## 2018-10-16 MED ORDER — SODIUM CHLORIDE 0.9% FLUSH
10.0000 mL | Freq: Once | INTRAVENOUS | Status: AC
  Administered 2018-10-16: 10 mL
  Filled 2018-10-16: qty 10

## 2018-10-16 NOTE — Telephone Encounter (Signed)
Moved lab/port appt to 5/4 per 4/14 los.   imaging will contact patient about scan appt.

## 2018-10-16 NOTE — Progress Notes (Signed)
[facility] OFFICE PROGRESS NOTE   Diagnosis: Pancreas cancer  INTERVAL HISTORY:   [name] returns as scheduled.  He reports not feeling any better after receiving IV fluids last week, but he has an improved appetite and energy level this week.  He continues to have approximately 3 loose stools each morning.  He has abdomen and back pain when he does not take Percocet.  He is taking Percocet approximately 3 times daily.  He has a cold sensation in the extremities.  No numbness.  He had an episode of pain at the neck and upper back this week.  Objective:  Vital signs in last 24 hours:  Blood pressure 133/90, pulse 79, temperature 98.2 F (36.8 C), temperature source Oral, resp. rate 19, height _0  (1.905 m), weight 208 lb 3.2 oz (94.4 kg), SpO2 100 %.     GI: The abdomen is soft and nontender, no mass, no apparent ascites Vascular: No leg edema  Skin: Palms without erythema  Portacath/PICC-without erythema  Lab Results:  Lab Results  Component Value Date   WBC 6.8 10/16/2018   HGB 11.0 (L) 10/16/2018   HCT 32.7 (L) 10/16/2018   MCV 84.7 10/16/2018   PLT 168 10/16/2018   NEUTROABS 5.9 10/16/2018    CMP  Lab Results  Component Value Date   NA 137 10/16/2018   K 2.9 (LL) 10/16/2018   CL 99 10/16/2018   CO2 26 10/16/2018   GLUCOSE 156 (H) 10/16/2018   BUN 6 (L) 10/16/2018   CREATININE 0.75 10/16/2018   CALCIUM 8.4 (L) 10/16/2018   PROT 6.1 (L) 10/16/2018   ALBUMIN 2.9 (L) 10/16/2018   AST 29 10/16/2018   ALT 38 10/16/2018   ALKPHOS 504 (H) 10/16/2018   BILITOT 0.6 10/16/2018   GFRNONAA >60 10/16/2018   GFRAA >60 10/16/2018   CA 19-9 on 10/01/2018: 4641   Medications: I have reviewed the patient's current medications.   Assessment/Plan: 1. Pancreas head mass  CT abdomen/pelvis 08/03/2017-fullness in the head of the pancreas with stranding in the peripancreatic fat and pancreatic ductal dilatation.   MRI abdomen 08/04/2017-findings  favored to represent acute on chronic pancreatitis; equivocal soft tissue fullness within the head and uncinate process of the pancreas; indeterminate too small to characterize right hepatic lobe lesion; small volume abdominal ascites; splenomegaly.   CA-19-9 elevated at 977on 08/04/2017.  Status postupper EUS 08/10/2017-findings of an irregular masslike region in the pancreatic head measuring approximately 2.7 cm. This directly abutted the main portal vein but no other major vascular structures. Biopsies obtained with preliminary cytology positive for malignancy, likely adenocarcinoma. The final report is pending. The common bile duct and main pancreatic duct were both dilated. ERCP was then proceeded with forstent placement. Multiple attempts were made tocannulate the bile duct without success. The procedure was aborted.  08/16/2017 ERCP with placement of a metal biliary stent in the common bile duct by Dr. Francella Solian at [facility].  Cycle 1 FOLFIRINOX 08/31/2017  Cycle 2 FOLFIRINOX 09/14/2017  Cycle 3 FOLFIRINOX 09/28/2017  Cycle 4 FOLFIRINOX 10/12/2017 (oxaliplatin dose reduced secondary to thrombocytopenia)  Cycle 5 FOLFIRINOX 10/26/2017  Cycle 6 FOLFIRINOX 11/09/2017  Restaging CTs at [facility] 11/29/2017-no definitive evidence of distant metastatic disease. 2 subcentimeter liver lesions described on prior MRI not visualized on CT. Ill-defined pancreatic head mass stable to decreased in size measuring 2.1 x 2 cm. Peripancreatic inflammatory stranding decreased from prior. No biliary ductal dilatation. Celiac axis less than 180 degrees abutment. Common hepatic artery with greater  than 180 degrees encasement; superior mesenteric artery with short segment less than 180 degrees abutment; portal vein/superior mesenteric vein with greater than 180 degrees encasement of the extrahepatic portal vein with associated circumferential narrowing at the portomesenteric, overall substantially improved from prior  examination. Now less than 180 degree abutment of the SMV. The portal vein and SMV remain patent. Splenic vein patent.  Cycle 1 gemcitabine/Abraxane 12/06/2017  Cycle 2 gemcitabine/Abraxane 12/20/2017  Cycle 3 gemcitabine/Abraxane 01/03/2018  Cycle 4 gemcitabine/Abraxane 01/17/2018  Cycle 5 gemcitabine 01/31/2018 (Abraxane held due to neuropathy)  Cycle 6 gemcitabine 02/14/2018 (Abraxane held due to neuropathy)  Cycle 7 gemcitabine 02/28/2018 (Abraxane held due to neuropathy)  CT chest/abdomen/pelvis at [facility] 03/08/2018- stable appearance of previously identified infiltrating pancreatic head mass. No CT evidence of metastatic disease.  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 Ill-defined pancreatic head mass not felt to be significantly changed.  CT chest/abdomen/pelvis 05/10/2018- ill-defined hypoattenuating pancreatic head mass and ill-defined soft tissue abutting the celiac axis with mildly increased dilatation of the main pancreatic duct. New soft tissue nodule along the right upper lobe bronchus; hypoattenuating liver lesions concerning for metastasis.  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 The stent traverses the pancreatic mass and extends into the duodenum. Increase in size of 2 liver lesions. Pancreatic mass appears similar. Indistinct tissue planes around the duodenum along with wall thickening in the gallbladder and especially the gallbladder neck.  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  Cycle 1 FOLFIRINOX 08/21/2018  Cycle 2 FOLFIRINOX 09/03/2018  Cycle 3 FOLFIRINOX 09/18/2018  Cycle 4 FOLFIRINOX 10/01/2018  Cycle 5 FOLFIRINOX 10/16/2018 (irinotecan dose reduced secondary to diarrhea) 2. Hypertension 3. Port-A-Cath placement 08/30/2017 4. History of elevated bilirubin-questionGilbert's syndrome 5. Mild leukopenia, thrombocytopenia-question secondary to portal hypertension/splenomegaly;09/14/2017 white count improved, platelet count stable;progressive thrombocytopenia following gemcitabine/Abraxane 6. Kidney stones 7. Fever following cycles 2 and 3 gemcitabine/Abraxane,? Fever related to gemcitabine 8.Jaundice, elevated liver enzymes and bilirubin 06/22/2018; status post ERCP 06/25/2018 with debris removed from the existing stent and a new plastic stent placed. Recurrent jaundice 07/19/2018 status post ERCP 07/20/2018 with finding that the previously placed plastic stent had migrated distally. There was debris and sludge on the stent. The previously placed stent was removed. 5 to 8 mm stricture just proximal to the existing SEMS. 2 plastic stents were placed. ERCP 08/16/2018- 2 plastic biliary stents were  removed from within the previously placed partially covered  SEMS; stricture confirmed just proximal to the previously placed SEMS, treated with placement of an 8 cm long 10 mm diameter uncovered SEMS in good position.       Disposition: [name] has completed 4 cycles of salvage therapy with FOLFIRINOX.  He will complete cycle 5 today.  The irinotecan will be dose reduced secondary to her long-term diarrhea. We will replete potassium and magnesium.  He will be scheduled for restaging CTs after this cycle.  [name] will continue oxycodone as needed for pain.  25 minutes were spent with the patient today.  The majority of the time was used for counseling and coordination of care.  Betsy Coder, MD  10/16/2018  9:43 AM

## 2018-10-16 NOTE — Patient Instructions (Signed)
[facility] Discharge Instructions for Patients Receiving Chemotherapy  Today you received the following chemotherapy agents : Oxaliplatin, Camptosar, Leucovorin, Fluorouracil.  To help prevent nausea and vomiting after your treatment, we encourage you to take your nausea medication as prescribed.   If you develop nausea and vomiting that is not controlled by your nausea medication, call the clinic.   BELOW ARE SYMPTOMS THAT SHOULD BE REPORTED IMMEDIATELY:  *FEVER GREATER THAN 100.5 F  *CHILLS WITH OR WITHOUT FEVER  NAUSEA AND VOMITING THAT IS NOT CONTROLLED WITH YOUR NAUSEA MEDICATION  *UNUSUAL SHORTNESS OF BREATH  *UNUSUAL BRUISING OR BLEEDING  TENDERNESS IN MOUTH AND THROAT WITH OR WITHOUT PRESENCE OF ULCERS  *URINARY PROBLEMS  *BOWEL PROBLEMS  UNUSUAL RASH Items with * indicate a potential emergency and should be followed up as soon as possible.  Feel free to call the clinic should you have any questions or concerns. The clinic phone number is [phone].  Please show the [facility] at check-in to the Emergency Department and triage nurse.

## 2018-10-16 NOTE — Progress Notes (Signed)
Per Dr. Benay Spice, decrease Irinotecan to 90mg /m2 due to diarrhea.  Hardie Pulley, PharmD, BCPS, BCOP

## 2018-10-16 NOTE — Progress Notes (Signed)
MD review of labs: OK to treat today with K+ 2.9. Will increase home dose to bid and give 40 meq in treatment area (po) today.

## 2018-10-17 LAB — CANCER ANTIGEN 19-9: CA 19-9: 1733 U/mL — ABNORMAL HIGH (ref 0–35)

## 2018-10-18 ENCOUNTER — Inpatient Hospital Stay: Payer: BLUE CROSS/BLUE SHIELD

## 2018-10-18 ENCOUNTER — Other Ambulatory Visit: Payer: Self-pay | Admitting: Oncology

## 2018-10-18 ENCOUNTER — Other Ambulatory Visit: Payer: Self-pay

## 2018-10-18 VITALS — BP 135/88 | HR 60 | Temp 98.1°F | Resp 17

## 2018-10-18 DIAGNOSIS — R109 Unspecified abdominal pain: Secondary | ICD-10-CM | POA: Diagnosis not present

## 2018-10-18 DIAGNOSIS — C250 Malignant neoplasm of head of pancreas: Secondary | ICD-10-CM

## 2018-10-18 DIAGNOSIS — R5381 Other malaise: Secondary | ICD-10-CM | POA: Diagnosis not present

## 2018-10-18 DIAGNOSIS — R194 Change in bowel habit: Secondary | ICD-10-CM | POA: Diagnosis not present

## 2018-10-18 DIAGNOSIS — Z5111 Encounter for antineoplastic chemotherapy: Secondary | ICD-10-CM | POA: Diagnosis not present

## 2018-10-18 DIAGNOSIS — Z5189 Encounter for other specified aftercare: Secondary | ICD-10-CM | POA: Diagnosis not present

## 2018-10-18 DIAGNOSIS — C787 Secondary malignant neoplasm of liver and intrahepatic bile duct: Secondary | ICD-10-CM | POA: Diagnosis not present

## 2018-10-18 MED ORDER — PEGFILGRASTIM-CBQV 6 MG/0.6ML ~~LOC~~ SOSY
PREFILLED_SYRINGE | SUBCUTANEOUS | Status: AC
  Filled 2018-10-18: qty 0.6

## 2018-10-18 MED ORDER — PEGFILGRASTIM-CBQV 6 MG/0.6ML ~~LOC~~ SOSY
6.0000 mg | PREFILLED_SYRINGE | Freq: Once | SUBCUTANEOUS | Status: AC
  Administered 2018-10-18: 6 mg via SUBCUTANEOUS

## 2018-10-18 MED ORDER — SODIUM CHLORIDE 0.9 % IV SOLN: 2.0000 g | Freq: Once | INTRAVENOUS | Status: DC

## 2018-10-18 MED ORDER — SODIUM CHLORIDE 0.9% FLUSH
10.0000 mL | INTRAVENOUS | Status: DC | PRN
  Administered 2018-10-18: 10 mL
  Filled 2018-10-18: qty 10

## 2018-10-18 MED ORDER — SODIUM CHLORIDE 0.9 % IV SOLN
Freq: Once | INTRAVENOUS | Status: AC
  Administered 2018-10-18: 14:00:00 via INTRAVENOUS
  Filled 2018-10-18: qty 500

## 2018-10-18 MED ORDER — HEPARIN SOD (PORK) LOCK FLUSH 100 UNIT/ML IV SOLN
500.0000 [IU] | Freq: Once | INTRAVENOUS | Status: AC | PRN
  Administered 2018-10-18: 500 [IU]
  Filled 2018-10-18: qty 5

## 2018-10-20 DIAGNOSIS — C250 Malignant neoplasm of head of pancreas: Secondary | ICD-10-CM | POA: Diagnosis not present

## 2018-10-24 ENCOUNTER — Telehealth: Payer: Self-pay | Admitting: *Deleted

## 2018-10-24 NOTE — Telephone Encounter (Addendum)
Wife called to follow up on status of CT scan needed by 11/02/18. Message forwarded to managed care to f/u on PA--in system as "incomplete". 10/25/18: Called wife back and informed her PA is complete on scan and OK to schedule now. Provided phone # for [address] to schedule scan. She will call back to let nurse know when she will come by to pick up contrast.

## 2018-10-26 ENCOUNTER — Encounter: Payer: Self-pay | Admitting: Oncology

## 2018-10-30 ENCOUNTER — Ambulatory Visit
Admission: RE | Admit: 2018-10-30 | Discharge: 2018-10-30 | Disposition: A | Discharge status: Home / Self Care | Payer: BLUE CROSS/BLUE SHIELD | Source: Ambulatory Visit | Attending: Oncology | Admitting: Oncology

## 2018-10-30 ENCOUNTER — Other Ambulatory Visit: Payer: Self-pay

## 2018-10-30 ENCOUNTER — Telehealth: Payer: Self-pay | Admitting: Oncology

## 2018-10-30 ENCOUNTER — Other Ambulatory Visit: Payer: BLUE CROSS/BLUE SHIELD

## 2018-10-30 DIAGNOSIS — C250 Malignant neoplasm of head of pancreas: Secondary | ICD-10-CM

## 2018-10-30 MED ORDER — IOPAMIDOL (ISOVUE-300) INJECTION 61%
125.0000 mL | Freq: Once | INTRAVENOUS | Status: AC | PRN
  Administered 2018-10-30: 125 mL via INTRAVENOUS

## 2018-10-30 NOTE — Telephone Encounter (Signed)
Spoke with patient regarding appointment that was added. Got him set up for webex and sent him the directions in his email.

## 2018-10-31 ENCOUNTER — Telehealth: Payer: Self-pay | Admitting: Oncology

## 2018-10-31 ENCOUNTER — Inpatient Hospital Stay (HOSPITAL_BASED_OUTPATIENT_CLINIC_OR_DEPARTMENT_OTHER): Payer: BLUE CROSS/BLUE SHIELD | Admitting: Oncology

## 2018-10-31 DIAGNOSIS — I10 Essential (primary) hypertension: Secondary | ICD-10-CM | POA: Diagnosis not present

## 2018-10-31 DIAGNOSIS — Z9221 Personal history of antineoplastic chemotherapy: Secondary | ICD-10-CM

## 2018-10-31 DIAGNOSIS — C250 Malignant neoplasm of head of pancreas: Secondary | ICD-10-CM

## 2018-10-31 MED ORDER — OXYCODONE-ACETAMINOPHEN 10-325 MG PO TABS: 1.0000 | ORAL_TABLET | ORAL | 0 refills | Status: DC | PRN

## 2018-10-31 NOTE — Telephone Encounter (Signed)
Scheduled appt per 4/29 los.  [facility] will contact patient about MRI appt.

## 2018-10-31 NOTE — Progress Notes (Signed)
[facility] OFFICE VISIT PROGRESS NOTE  I connected with Cory Kelly on 10/31/18 at 10:40 AM EDT by video visit and verified that I am speaking with the correct person using two identifiers.     Other persons participating in the visit and their role in the encounter: Wife  Patient's location: Home Provider's location: Office   Diagnosis: Pancreas cancer  INTERVAL HISTORY:   Cory Kelly completed another cycle of FOLFIRINOX on 10/16/2018.  He reports cold sensitivity, but no other neuropathy symptoms.  He continues to have abdomen and back pain, relieved with oxycodone.  He had diarrhea yesterday after the CT scan.    Lab Results:  Lab Results  Component Value Date   WBC 6.8 10/16/2018   HGB 11.0 (L) 10/16/2018   HCT 32.7 (L) 10/16/2018   MCV 84.7 10/16/2018   PLT 168 10/16/2018   NEUTROABS 5.9 10/16/2018    Imaging:  Ct Abdomen Pelvis W Contrast  Result Date: 10/31/2018 CLINICAL DATA:  Pancreatic carcinoma. Liver metastasis. Esophageal chemotherapy ongoing. Five cycles. EXAM: CT ABDOMEN AND PELVIS WITH CONTRAST TECHNIQUE: Multidetector CT imaging of the abdomen and pelvis was performed using the standard protocol following bolus administration of intravenous contrast. CONTRAST:  153m ISOVUE-300 IOPAMIDOL (ISOVUE-300) INJECTION 61% COMPARISON:  MRI 08/08/2018, 08/03/2017 FINDINGS: Lower chest:  Lung bases are clear Hepatobiliary: Differential perfusion with hypoperfusion of the LEFT hepatic lobe and normal perfusion of the RIGHT hepatic lobe. Common bile duct stent in place. Expected pneumobilia in the nondependent liver. No evidence of biliary duct dilatation. The hepatic metastasis identified on comparison MRI are not well appreciated by CT imaging. Some lesions along the gallbladder fossa are noted presumed metastasis (image 32/2). The gallbladder wall is thickened which is increased from comparison exam. Pancreas: Mass in  the head of the pancreas/uncinate measures 3.8 by 2.8 cm and difficult to measure mass on comparison MRI but the uncinate mass appears slightly larger with the uncinate measuring 2.7 x 2.5 on comparison MRI (image 66/series 07/05/2001). Mild pancreatic duct dilatation unchanged. Spleen: Normal spleen. Adrenals/urinary tract: Nodule within the RIGHT adrenal gland measuring 13 mm unchanged kidneys are normal. Stomach/Bowel: Stomach, duodenum small-bowel normal. There is some bowel wall thickening in the ascending colon cecum small amount of inflammation (image 70/2). The more distal colon rectosigmoid colon normal. Small amount of fluid along the LEFT pericolic gutter is noted. Small amount fluid in the pelvis additionally. Vascular/Lymphatic: Abdominal aortic normal caliber. No retroperitoneal periportal lymphadenopathy. Portal veins are patent. Hepatic veins patent. Hepatic artery is difficult to follow. Musculoskeletal: No aggressive osseous lesion IMPRESSION: 1. Mass in the uncinate of the pancreas does not appear improved and may be slightly larger. 2. Discrete hepatic metastasis are difficult to measure compared to comparison MRI. Suspected metastasis along the gallbladder fossa are noted. 3. Differential perfusion of the liver with hypoperfusion to the entire LEFT hepatic lobe. Portal veins patent. 4. Biliary stent in place without biliary duct dilatation. 5. New gallbladder wall thickening. This may relate to new intraperitoneal free fluid. Recommend correlation for cholecystitis. 6. Small amount of fluid along the pericolic gutters and pelvis and along the RIGHT hepatic margin is new from prior. 7. Small bowel inflammation involving the ascending colon is nonspecific. Normal white blood cell weighs against typhlitis. Electronically Signed   By: SSuzy BouchardM.D.   On: 10/31/2018 08:07    Medications: I have reviewed the patient's current medications.  Assessment/Plan: 1. Pancreas head mass  CT  abdomen/pelvis  08/03/2017-fullness in the head of the pancreas with stranding in the peripancreatic fat and pancreatic ductal dilatation.   MRI abdomen 08/04/2017-findings favored to represent acute on chronic pancreatitis; equivocal soft tissue fullness within the head and uncinate process of the pancreas; indeterminate too small to characterize right hepatic lobe lesion; small volume abdominal ascites; splenomegaly.   CA-19-9 elevated at 977on 08/04/2017.  Status postupper EUS 08/10/2017-findings of an irregular masslike region in the pancreatic head measuring approximately 2.7 cm. This directly abutted the main portal vein but no other major vascular structures. Biopsies obtained with preliminary cytology positive for malignancy, likely adenocarcinoma. The final report is pending. The common bile duct and main pancreatic duct were both dilated. ERCP was then proceeded with forstent placement. Multiple attempts were made tocannulate the bile duct without success. The procedure was aborted.  08/16/2017 ERCP with placement of a metal biliary stent in the common bile duct by Dr. Francella Solian at [facility].  Cycle 1 FOLFIRINOX 08/31/2017  Cycle 2 FOLFIRINOX 09/14/2017  Cycle 3 FOLFIRINOX 09/28/2017  Cycle 4 FOLFIRINOX 10/12/2017 (oxaliplatin dose reduced secondary to thrombocytopenia)  Cycle 5 FOLFIRINOX 10/26/2017  Cycle 6 FOLFIRINOX 11/09/2017  Restaging CTs at [facility] 11/29/2017-no definitive evidence of distant metastatic disease. 2 subcentimeter liver lesions described on prior MRI not visualized on CT. Ill-defined pancreatic head mass stable to decreased in size measuring 2.1 x 2 cm. Peripancreatic inflammatory stranding decreased from prior. No biliary ductal dilatation. Celiac axis less than 180 degrees abutment. Common hepatic artery with greater than 180 degrees encasement; superior mesenteric artery with short segment less than 180 degrees abutment; portal vein/superior mesenteric vein with  greater than 180 degrees encasement of the extrahepatic portal vein with associated circumferential narrowing at the portomesenteric, overall substantially improved from prior examination. Now less than 180 degree abutment of the SMV. The portal vein and SMV remain patent. Splenic vein patent.  Cycle 1 gemcitabine/Abraxane 12/06/2017  Cycle 2 gemcitabine/Abraxane 12/20/2017  Cycle 3 gemcitabine/Abraxane 01/03/2018  Cycle 4 gemcitabine/Abraxane 01/17/2018  Cycle 5 gemcitabine 01/31/2018 (Abraxane held due to neuropathy)  Cycle 6 gemcitabine 02/14/2018 (Abraxane held due to neuropathy)  Cycle 7 gemcitabine 02/28/2018 (Abraxane held due to neuropathy)  CT [facility]abdomen/pelvis at [facility] 03/08/2018- stable appearance of previously identified infiltrating pancreatic head mass. No CT evidence of metastatic disease.  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 Ill-defined pancreatic head mass not felt to be significantly changed.  CT chest/abdomen/pelvis 05/10/2018- ill-defined hypoattenuating pancreatic head mass and ill-defined soft tissue abutting the celiac axis with mildly increased dilatation of the main pancreatic duct. New soft tissue nodule along the right upper lobe bronchus; hypoattenuating liver lesions concerning for metastasis.  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 The stent traverses the pancreatic mass and extends into the duodenum. Increase in size of 2 liver lesions. Pancreatic mass  appears similar. Indistinct tissue planes around the duodenum along with wall thickening in the gallbladder and especially the gallbladder neck.  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  Cycle 1 FOLFIRINOX 08/21/2018  Cycle 2 FOLFIRINOX 09/03/2018  Cycle 3 FOLFIRINOX 09/18/2018  Cycle 4 FOLFIRINOX 10/01/2018  Cycle 5 FOLFIRINOX 10/16/2018 (irinotecan dose reduced secondary to diarrhea)  CTs 10/30/2018- slight enlargement of pancreas mass, hepatic lesions seen on MRI are not visualized, probable metastasis at the gallbladder fossa, no biliary duct dilatation, gallbladder wall thickening-new, small amount of fluid in the pericolic gutters, right perihepatic margin, and pelvis, thickening at the ascending colon 2. Hypertension 3. Port-A-Cath placement 08/30/2017 4. History of elevated bilirubin-questionGilbert's syndrome 5. Mild leukopenia, thrombocytopenia-question secondary to portal hypertension/splenomegaly;09/14/2017 white count improved, platelet count stable;progressive thrombocytopenia following gemcitabine/Abraxane 6. Kidney stones 7. Fever following cycles 2 and 3 gemcitabine/Abraxane,? Fever related to gemcitabine 8.Jaundice,  elevated liver enzymes and bilirubin 06/22/2018; status post ERCP 06/25/2018 with debris removed from the existing stent and a new plastic stent placed. Recurrent jaundice 07/19/2018 status post ERCP 07/20/2018 with finding that the previously placed plastic stent had migrated distally. There was debris and sludge on the stent. The previously placed stent was removed. 5 to 8 mm stricture just proximal to the existing SEMS. 2 plastic stents were placed.ERCP 08/16/2018- 2 plastic biliary stents were removed from within the previously placed partially covered SEMS; stricture confirmed just proximal to the previously placed SEMS, treated with placement of an 8 cm long 10 mm diameter uncovered SEMS in good position.      Disposition: I discussed the CT findings with Mr. Haycraft and his wife.  We reviewed the CT images.  There is no clear evidence of disease progression.  The liver lesions noted on the pretreatment MRI were not seen on the CT.  I discussed the case with radiology.  A liver MRI is recommended for a direct comparison. The CA 19-9 continues to be lower.  His overall clinical status is stable.  The plan is to proceed with FOLFIRINOX chemotherapy next week if the MRI reveals no disease progression.  Mr. Seider will continue oxycodone as needed for pain.   I discussed the assessment and treatment plan with the patient. The patient was provided an opportunity to ask questions and all were answered. The patient agreed with the plan and demonstrated an understanding of the instructions.   The patient was advised to call back or seek an in-person evaluation if the symptoms worsen or if the condition fails to improve as anticipated.  I provided 25 minutes of video consultation, review of images, and documentation during this encounter, and > 50% was spent counseling as documented under my assessment & plan.  Betsy Coder ANP/GNP-BC   10/31/2018 10:45 AM

## 2018-11-01 ENCOUNTER — Ambulatory Visit: Payer: BLUE CROSS/BLUE SHIELD | Admitting: Oncology

## 2018-11-01 ENCOUNTER — Telehealth: Payer: Self-pay | Admitting: *Deleted

## 2018-11-01 DIAGNOSIS — C250 Malignant neoplasm of head of pancreas: Secondary | ICD-10-CM

## 2018-11-01 NOTE — Telephone Encounter (Addendum)
Wife asking if MD can release the CT scan results from 10/30/18 to [place]? Also was called by radiology and told 1st available for MRI scan is 11/15/18. Moved scan to [facility] on 11/05/18 at 1030/1100 and he is to be NPO 4 hours prior. Patient notified and was also able to see CT in [place].

## 2018-11-03 ENCOUNTER — Emergency Department (HOSPITAL_COMMUNITY): Payer: BLUE CROSS/BLUE SHIELD

## 2018-11-03 ENCOUNTER — Other Ambulatory Visit: Payer: Self-pay

## 2018-11-03 ENCOUNTER — Emergency Department (HOSPITAL_COMMUNITY)
Admission: EM | Admit: 2018-11-03 | Discharge: 2018-11-04 | Disposition: A | Discharge status: Home / Self Care | Payer: BLUE CROSS/BLUE SHIELD | Source: Home / Self Care | Attending: Emergency Medicine | Admitting: Emergency Medicine

## 2018-11-03 ENCOUNTER — Encounter (HOSPITAL_COMMUNITY): Payer: Self-pay | Admitting: Emergency Medicine

## 2018-11-03 DIAGNOSIS — R32 Unspecified urinary incontinence: Secondary | ICD-10-CM | POA: Diagnosis not present

## 2018-11-03 DIAGNOSIS — G9340 Encephalopathy, unspecified: Secondary | ICD-10-CM | POA: Diagnosis not present

## 2018-11-03 DIAGNOSIS — Z85828 Personal history of other malignant neoplasm of skin: Secondary | ICD-10-CM | POA: Insufficient documentation

## 2018-11-03 DIAGNOSIS — D6959 Other secondary thrombocytopenia: Secondary | ICD-10-CM | POA: Diagnosis not present

## 2018-11-03 DIAGNOSIS — C259 Malignant neoplasm of pancreas, unspecified: Secondary | ICD-10-CM | POA: Diagnosis not present

## 2018-11-03 DIAGNOSIS — B954 Other streptococcus as the cause of diseases classified elsewhere: Secondary | ICD-10-CM | POA: Diagnosis not present

## 2018-11-03 DIAGNOSIS — I619 Nontraumatic intracerebral hemorrhage, unspecified: Secondary | ICD-10-CM | POA: Diagnosis not present

## 2018-11-03 DIAGNOSIS — Z03818 Encounter for observation for suspected exposure to other biological agents ruled out: Secondary | ICD-10-CM | POA: Diagnosis not present

## 2018-11-03 DIAGNOSIS — R58 Hemorrhage, not elsewhere classified: Secondary | ICD-10-CM | POA: Diagnosis not present

## 2018-11-03 DIAGNOSIS — R29700 NIHSS score 0: Secondary | ICD-10-CM | POA: Diagnosis not present

## 2018-11-03 DIAGNOSIS — D6869 Other thrombophilia: Secondary | ICD-10-CM | POA: Diagnosis not present

## 2018-11-03 DIAGNOSIS — I340 Nonrheumatic mitral (valve) insufficiency: Secondary | ICD-10-CM | POA: Diagnosis not present

## 2018-11-03 DIAGNOSIS — Z8349 Family history of other endocrine, nutritional and metabolic diseases: Secondary | ICD-10-CM | POA: Diagnosis not present

## 2018-11-03 DIAGNOSIS — Z79899 Other long term (current) drug therapy: Secondary | ICD-10-CM | POA: Insufficient documentation

## 2018-11-03 DIAGNOSIS — K8309 Other cholangitis: Secondary | ICD-10-CM | POA: Diagnosis not present

## 2018-11-03 DIAGNOSIS — R509 Fever, unspecified: Secondary | ICD-10-CM

## 2018-11-03 DIAGNOSIS — R4781 Slurred speech: Secondary | ICD-10-CM | POA: Diagnosis not present

## 2018-11-03 DIAGNOSIS — D6481 Anemia due to antineoplastic chemotherapy: Secondary | ICD-10-CM | POA: Diagnosis not present

## 2018-11-03 DIAGNOSIS — R4182 Altered mental status, unspecified: Secondary | ICD-10-CM | POA: Diagnosis present

## 2018-11-03 DIAGNOSIS — R945 Abnormal results of liver function studies: Secondary | ICD-10-CM | POA: Diagnosis not present

## 2018-11-03 DIAGNOSIS — R2990 Unspecified symptoms and signs involving the nervous system: Secondary | ICD-10-CM | POA: Diagnosis not present

## 2018-11-03 DIAGNOSIS — I10 Essential (primary) hypertension: Secondary | ICD-10-CM | POA: Diagnosis not present

## 2018-11-03 DIAGNOSIS — I700 Atherosclerosis of aorta: Secondary | ICD-10-CM | POA: Diagnosis present

## 2018-11-03 DIAGNOSIS — D649 Anemia, unspecified: Secondary | ICD-10-CM | POA: Diagnosis not present

## 2018-11-03 DIAGNOSIS — R748 Abnormal levels of other serum enzymes: Secondary | ICD-10-CM | POA: Diagnosis not present

## 2018-11-03 DIAGNOSIS — R161 Splenomegaly, not elsewhere classified: Secondary | ICD-10-CM | POA: Diagnosis present

## 2018-11-03 DIAGNOSIS — C787 Secondary malignant neoplasm of liver and intrahepatic bile duct: Secondary | ICD-10-CM | POA: Diagnosis not present

## 2018-11-03 DIAGNOSIS — R7881 Bacteremia: Secondary | ICD-10-CM | POA: Diagnosis not present

## 2018-11-03 DIAGNOSIS — R0602 Shortness of breath: Secondary | ICD-10-CM | POA: Diagnosis not present

## 2018-11-03 DIAGNOSIS — R4701 Aphasia: Secondary | ICD-10-CM | POA: Diagnosis not present

## 2018-11-03 DIAGNOSIS — R410 Disorientation, unspecified: Secondary | ICD-10-CM | POA: Diagnosis not present

## 2018-11-03 DIAGNOSIS — I6523 Occlusion and stenosis of bilateral carotid arteries: Secondary | ICD-10-CM | POA: Diagnosis not present

## 2018-11-03 DIAGNOSIS — I639 Cerebral infarction, unspecified: Secondary | ICD-10-CM | POA: Diagnosis not present

## 2018-11-03 DIAGNOSIS — R740 Nonspecific elevation of levels of transaminase and lactic acid dehydrogenase [LDH]: Secondary | ICD-10-CM | POA: Diagnosis not present

## 2018-11-03 DIAGNOSIS — Z8507 Personal history of malignant neoplasm of pancreas: Secondary | ICD-10-CM | POA: Insufficient documentation

## 2018-11-03 DIAGNOSIS — K719 Toxic liver disease, unspecified: Secondary | ICD-10-CM | POA: Diagnosis present

## 2018-11-03 DIAGNOSIS — E785 Hyperlipidemia, unspecified: Secondary | ICD-10-CM | POA: Diagnosis not present

## 2018-11-03 DIAGNOSIS — K828 Other specified diseases of gallbladder: Secondary | ICD-10-CM | POA: Diagnosis not present

## 2018-11-03 DIAGNOSIS — I629 Nontraumatic intracranial hemorrhage, unspecified: Secondary | ICD-10-CM | POA: Diagnosis not present

## 2018-11-03 DIAGNOSIS — T451X5A Adverse effect of antineoplastic and immunosuppressive drugs, initial encounter: Secondary | ICD-10-CM | POA: Diagnosis not present

## 2018-11-03 DIAGNOSIS — Z9689 Presence of other specified functional implants: Secondary | ICD-10-CM | POA: Diagnosis not present

## 2018-11-03 DIAGNOSIS — G9341 Metabolic encephalopathy: Secondary | ICD-10-CM | POA: Diagnosis not present

## 2018-11-03 DIAGNOSIS — Z87442 Personal history of urinary calculi: Secondary | ICD-10-CM | POA: Diagnosis not present

## 2018-11-03 DIAGNOSIS — C250 Malignant neoplasm of head of pancreas: Secondary | ICD-10-CM | POA: Diagnosis not present

## 2018-11-03 DIAGNOSIS — Z20828 Contact with and (suspected) exposure to other viral communicable diseases: Secondary | ICD-10-CM

## 2018-11-03 DIAGNOSIS — G629 Polyneuropathy, unspecified: Secondary | ICD-10-CM | POA: Diagnosis present

## 2018-11-03 DIAGNOSIS — R0902 Hypoxemia: Secondary | ICD-10-CM | POA: Diagnosis not present

## 2018-11-03 DIAGNOSIS — B955 Unspecified streptococcus as the cause of diseases classified elsewhere: Secondary | ICD-10-CM | POA: Diagnosis not present

## 2018-11-03 DIAGNOSIS — K766 Portal hypertension: Secondary | ICD-10-CM | POA: Diagnosis not present

## 2018-11-03 DIAGNOSIS — Z95828 Presence of other vascular implants and grafts: Secondary | ICD-10-CM | POA: Diagnosis not present

## 2018-11-03 DIAGNOSIS — R809 Proteinuria, unspecified: Secondary | ICD-10-CM | POA: Diagnosis not present

## 2018-11-03 LAB — COMPREHENSIVE METABOLIC PANEL WITH GFR
ALT: 90 U/L — ABNORMAL HIGH (ref 0–44)
AST: 152 U/L — ABNORMAL HIGH (ref 15–41)
Albumin: 3.2 g/dL — ABNORMAL LOW (ref 3.5–5.0)
Alkaline Phosphatase: 672 U/L — ABNORMAL HIGH (ref 38–126)
Anion gap: 13 (ref 5–15)
BUN: 7 mg/dL — ABNORMAL LOW (ref 8–23)
CO2: 24 mmol/L (ref 22–32)
Calcium: 8.6 mg/dL — ABNORMAL LOW (ref 8.9–10.3)
Chloride: 99 mmol/L (ref 98–111)
Creatinine, Ser: 0.89 mg/dL (ref 0.61–1.24)
GFR calc Af Amer: >60 mL/min
GFR calc non Af Amer: >60 mL/min
Glucose, Bld: 87 mg/dL (ref 70–99)
Potassium: 3.5 mmol/L (ref 3.5–5.1)
Sodium: 136 mmol/L (ref 135–145)
Total Bilirubin: 2.4 mg/dL — ABNORMAL HIGH (ref 0.3–1.2)
Total Protein: 6.2 g/dL — ABNORMAL LOW (ref 6.5–8.1)

## 2018-11-03 LAB — CBC WITH DIFFERENTIAL/PLATELET
Abs Immature Granulocytes: 0.08 K/uL — ABNORMAL HIGH (ref 0.00–0.07)
Basophils Absolute: 0 K/uL (ref 0.0–0.1)
Basophils Relative: 0 %
Eosinophils Absolute: 0 K/uL (ref 0.0–0.5)
Eosinophils Relative: 0 %
HCT: 38.5 % — ABNORMAL LOW (ref 39.0–52.0)
Hemoglobin: 12.1 g/dL — ABNORMAL LOW (ref 13.0–17.0)
Immature Granulocytes: 1 %
Lymphocytes Relative: 1 %
Lymphs Abs: 0.1 K/uL — ABNORMAL LOW (ref 0.7–4.0)
MCH: 27.8 pg (ref 26.0–34.0)
MCHC: 31.4 g/dL (ref 30.0–36.0)
MCV: 88.3 fL (ref 80.0–100.0)
Monocytes Absolute: 0 K/uL — ABNORMAL LOW (ref 0.1–1.0)
Monocytes Relative: 0 %
Neutro Abs: 10.2 K/uL — ABNORMAL HIGH (ref 1.7–7.7)
Neutrophils Relative %: 98 %
Platelets: 117 K/uL — ABNORMAL LOW (ref 150–400)
RBC: 4.36 MIL/uL (ref 4.22–5.81)
RDW: 15 % (ref 11.5–15.5)
WBC: 10.5 K/uL (ref 4.0–10.5)
nRBC: 0 % (ref 0.0–0.2)

## 2018-11-03 LAB — SARS CORONAVIRUS 2 BY RT PCR (HOSPITAL ORDER, PERFORMED IN ~~LOC~~ HOSPITAL LAB): SARS Coronavirus 2: NEGATIVE

## 2018-11-03 LAB — LACTIC ACID, PLASMA: Lactic Acid, Venous: 2.3 mmol/L (ref 0.5–1.9)

## 2018-11-03 MED ORDER — CIPROFLOXACIN HCL 500 MG PO TABS
500.0000 mg | ORAL_TABLET | Freq: Once | ORAL | Status: AC
  Administered 2018-11-03: 500 mg via ORAL
  Filled 2018-11-03: qty 1

## 2018-11-03 MED ORDER — CIPROFLOXACIN HCL 500 MG PO TABS: 500.0000 mg | ORAL_TABLET | Freq: Two times a day (BID) | ORAL | 0 refills | Status: DC

## 2018-11-03 MED ORDER — SODIUM CHLORIDE 0.9 % IV SOLN
1000.0000 mL | INTRAVENOUS | Status: DC
  Administered 2018-11-03: 1000 mL via INTRAVENOUS

## 2018-11-03 NOTE — ED Triage Notes (Signed)
Patient comes to ED with a complaint of fever. Patient has a hx of pancreatic cancer. Patient last chemo treatment was three weeks ago. Patient does not have any pain.

## 2018-11-03 NOTE — ED Provider Notes (Signed)
[facility] DEPT Provider Note   CSN: [id number] Arrival date & time: 11/03/18  2032    History   Chief Complaint Chief Complaint  Patient presents with  . Fever    HPI Cory Kelly is a 63 y.o. male.     63 year old male with history of pancreatic cancer who last received chemotherapy 3 weeks ago presents with feeling warm today.  Wife took his temperature and it was 101.  He denies any cough or congestion.  No vomiting or diarrhea.  No new abdominal discomfort.  Does note some urinary frequency without flank pain.  No headache or neck discomfort.  Denies any photophobia.  No rashes noted.  Called his physician and told to come here     Past Medical History:  Diagnosis Date  . Abdominal pain    due to bloating  . Bloating   . Cancer ([place])    skin cancer  . Essential hypertension   . Fatigue   . History of kidney stones    passed  . History of weight loss   . HOH (hard of hearing)    no hearing aids  . Neuralgia   . Pancreatic cancer ([place])   . Stress    loss of father in Dec 2018.    Patient Active Problem List   Diagnosis Date Noted  . Biliary stent migration, initial encounter   . Pancreatic cancer ([place]) 07/20/2018  . 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 Jaundice 07/20/2018  . History of ERCP 07/20/2018  . Mass of pancreas 07/20/2018  . Goals of care, counseling/discussion 07/04/2018  . Cholangitis 06/22/2018  . Port-A-Cath in place 08/31/2017  . 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[place]) 08/21/2017  . Abnormal pancreas function test   . Pancreatic abnormality   . 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 Elevated LFTs 08/04/2017  . 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 Elevated serum creatinine 11/20/2012  . Elevated PSA 03/05/2012  . Essential hypertension 11/02/2011  . Hyperlipemia 11/02/2011    Past Surgical History:  Procedure Laterality Date  . APPENDECTOMY    . BILIARY STENT PLACEMENT N/A 06/25/2018   Procedure: BILIARY STENT PLACEMENT;  Surgeon: Rush Landmark Telford Nab., MD;  Location: [facility] ENDOSCOPY;  Service: Gastroenterology;  Laterality: N/A;  . BILIARY STENT PLACEMENT  07/20/2018   Procedure: BILIARY STENT PLACEMENT;  Surgeon: Milus Banister, MD;  Location[facility] ENDOSCOPY;  Service: Gastroenterology;;  . BILIARY STENT PLACEMENT N/A 08/16/2018   Procedure: BILIARY STENT PLACEMENT;  Surgeon: Milus Banister, MD;  Location: [facility] ENDOSCOPY;  Service: Endoscopy;  Laterality: N/A;  . ENDOSCOPIC RETROGRADE CHOLANGIOPANCREATOGRAPHY (ERCP) WITH PROPOFOL N/A 08/10/2017   Procedure: ENDOSCOPIC RETROGRADE CHOLANGIOPANCREATOGRAPHY (ERCP) WITH PROPOFOL;  Surgeon: Milus Banister, MD;  Location: [facility] ENDOSCOPY;  Service: Endoscopy;  Laterality: N/A;  . ENDOSCOPIC RETROGRADE CHOLANGIOPANCREATOGRAPHY (ERCP) WITH PROPOFOL N/A 07/20/2018   Procedure: ENDOSCOPIC RETROGRADE CHOLANGIOPANCREATOGRAPHY (ERCP) WITH PROPOFOL;  Surgeon: Milus Banister, MD;  Location[facility] ENDOSCOPY;  Service: Gastroenterology;  Laterality: N/A;  Balloon Sweeps  . ENDOSCOPIC RETROGRADE CHOLANGIOPANCREATOGRAPHY (ERCP) WITH PROPOFOL N/A 08/16/2018   Procedure: ENDOSCOPIC RETROGRADE CHOLANGIOPANCREATOGRAPHY (ERCP) WITH PROPOFOL;  Surgeon: Milus Banister, MD;  Location: [facility] ENDOSCOPY;  Service: Endoscopy;  Laterality: N/A;  . ERCP N/A 06/25/2018   Procedure: ENDOSCOPIC RETROGRADE CHOLANGIOPANCREATOGRAPHY (ERCP);  Surgeon: Irving Copas., MD;  Location: [facility] ENDOSCOPY;  Service: Gastroenterology;  Laterality: N/A;  . EUS N/A 08/10/2017   Procedure: UPPER ENDOSCOPIC ULTRASOUND (EUS) RADIAL;  Surgeon: Milus Banister, MD;  Location: [facility] ENDOSCOPY;  Service: Endoscopy;  Laterality: N/A;  . EYE SURGERY     lasik/left eye  . IR FLUORO GUIDE PORT INSERTION RIGHT  08/30/2017  . IR US GUIDE VASC ACCESS RIGHT  08/30/2017  . REMOVAL OF STONES  06/25/2018   Procedure: REMOVAL OF STONES;  Surgeon: Rush Landmark Telford Nab., MD;  Location: [facility] ENDOSCOPY;   Service: Gastroenterology;;  . SKIN CANCER EXCISION    . STENT REMOVAL  07/20/2018   Procedure: STENT REMOVAL;  Surgeon: Milus Banister, MD;  Location: [facility] ENDOSCOPY;  Service: Gastroenterology;;  . Lavell Islam REMOVAL  08/16/2018   Procedure: STENT REMOVAL;  Surgeon: Milus Banister, MD;  Location: [facility] ENDOSCOPY;  Service: Endoscopy;;        Home Medications    Prior to Admission medications   Medication Sig Start Date End Date Taking? Authorizing Provider  acetaminophen (TYLENOL) 500 MG tablet Take 500-1,000 mg by mouth every 6 (six) hours as needed for moderate pain.    [provider]  amLODipine (NORVASC) 10 MG tablet Take 10 mg by mouth at bedtime.  05/21/18   [provider]  fluticasone (FLONASE) 50 MCG/ACT nasal spray Place 1 spray into both nostrils daily as needed for allergies or rhinitis.    [provider]  gabapentin (NEURONTIN) 100 MG capsule Take 2 capsules (200 mg total) by mouth 2 (two) times daily. 10/01/18   Ladell Pier, MD  ibuprofen (ADVIL,MOTRIN) 200 MG tablet Take 600 mg by mouth every 6 (six) hours as needed for headache or moderate pain.    [provider]  lidocaine-prilocaine (EMLA) cream Apply 1 application topically as needed. Apply to portacath site 1-2 hours prior to use 08/20/18   Owens Shark, NP  loperamide (IMODIUM A-D) 2 MG tablet Take 1 tablet (2 mg total) by mouth as directed. 08/22/18   Ladell Pier, MD  naproxen sodium (ALEVE) 220 MG tablet Take 440 mg by mouth daily as needed (pain).    [provider]  olmesartan (BENICAR) 40 MG tablet Take 1 tablet (40 mg total) by mouth daily. 04/04/18   Weber, Sarah L, PA-C  ondansetron (ZOFRAN) 4 MG tablet Take 1 tablet (4 mg total) by mouth every 6 (six) hours as needed for nausea. 06/25/18   Roxan Hockey, MD  oxyCODONE-acetaminophen (PERCOCET) 10-325 MG tablet Take 1 tablet by mouth every 4 (four) hours as needed for pain. 10/31/18   Ladell Pier, MD   potassium chloride SA (K-DUR,KLOR-CON) 20 MEQ tablet Take 1 tablet (20 mEq total) by mouth 2 (two) times daily. 10/16/18   Ladell Pier, MD  [facility] ACETATE EX Apply 1 application topically daily as needed (itching).    [provider]  prochlorperazine (COMPAZINE) 10 MG tablet Take 1 tablet (10 mg total) by mouth every 6 (six) hours as needed for nausea or vomiting. Patient not taking: Reported on 10/16/2018 10/01/18   Ladell Pier, MD    Family History Family History  Problem Relation Age of Onset  . Hyperlipidemia Father   . Heart disease Father   . Dementia Father   . Memory loss Mother   . Colon cancer Neg Hx   . Esophageal cancer Neg Hx   . Inflammatory bowel disease Neg Hx   . Liver disease Neg Hx   . Rectal cancer Neg Hx   . Stomach cancer Neg Hx   . Pancreatic cancer Neg Hx     Social History Social History   Tobacco Use  .  Smoking status: Never Smoker  . Smokeless tobacco: Never Used  Substance Use Topics  . Alcohol use: No  . Drug use: No     Allergies   Patient has no known allergies.   Review of Systems Review of Systems  All other systems reviewed and are negative.    Physical Exam Updated Vital Signs BP 120/79 (BP Location: Right Arm)   Pulse (!) 110   Temp 99.1 F (37.3 C) (Oral)   Resp 18   Physical Exam Vitals signs and nursing note reviewed.  Constitutional:      General: He is not in acute distress.    Appearance: Normal appearance. He is well-developed. He is not toxic-appearing.  HENT:     Head: Normocephalic and atraumatic.  Eyes:     General: Lids are normal.     Conjunctiva/sclera: Conjunctivae normal.     Pupils: Pupils are equal, round, and reactive to light.  Neck:     Musculoskeletal: Normal range of motion and neck supple.     Thyroid: No thyroid mass.     Trachea: No tracheal deviation.  Cardiovascular:     Rate and Rhythm: Normal rate and regular rhythm.     Heart sounds: Normal heart sounds. No  murmur. No gallop.   Pulmonary:     Effort: Pulmonary effort is normal. No respiratory distress.     Breath sounds: Normal breath sounds. No stridor. No decreased breath sounds, wheezing, rhonchi or rales.  Abdominal:     General: Bowel sounds are normal. There is no distension.     Palpations: Abdomen is soft.     Tenderness: There is no abdominal tenderness. There is no rebound.  Musculoskeletal: Normal range of motion.        General: No tenderness.  Skin:    General: Skin is warm and dry.     Findings: No abrasion or rash.  Neurological:     Mental Status: He is alert and oriented to person, place, and time.     GCS: GCS eye subscore is 4. GCS verbal subscore is 5. GCS motor subscore is 6.     Cranial Nerves: No cranial nerve deficit.     Sensory: No sensory deficit.  Psychiatric:        Speech: Speech normal.        Behavior: Behavior normal.      ED Treatments / Results  Labs (all labs ordered are listed, but only abnormal results are displayed) Labs Reviewed  CULTURE, BLOOD (ROUTINE X 2)  CULTURE, BLOOD (ROUTINE X 2)  SARS CORONAVIRUS 2 (HOSPITAL ORDER, [place] [facility])  LACTIC ACID, PLASMA  LACTIC ACID, PLASMA  COMPREHENSIVE METABOLIC PANEL  CBC WITH DIFFERENTIAL/PLATELET  URINALYSIS, ROUTINE W REFLEX MICROSCOPIC    EKG None  Radiology No results found.  Procedures Procedures (including critical care time)  Medications Ordered in ED Medications  0.9 %  sodium chloride infusion (has no administration in time range)     Initial Impression / Assessment and Plan / ED Course  I have reviewed the triage vital signs and the nursing notes.  Pertinent labs & imaging results that were available during my care of the patient were reviewed by me and considered in my medical decision making (see chart for details).        Patient's labs reviewed with oncologist on-call, Dr. Daryel November D ENA.  He recommends patient received Cipro 500 mg p.o.  twice daily and follow-up in the clinic on Monday.  Patient had urinary symptoms and his urine is pending at this time.  Dr. Florina Ou to follow-up  Final Clinical Impressions(s) / ED Diagnoses   Final diagnoses:  None    ED Discharge Orders    None       Lacretia Leigh, MD 11/03/18 2336

## 2018-11-03 NOTE — ED Notes (Signed)
CRITICAL VALUE STICKER  CRITICAL VALUE: Lactic 2.3  RECEIVER (on-site recipient of call): Maylon Cos T RN  DATE & TIME NOTIFIED: 11/03/18 11:26p  MESSENGER (representative from lab): Larene Beach  MD NOTIFIED: Zenia Resides, MD  TIME OF NOTIFICATION: 1127p  RESPONSE: see orders

## 2018-11-03 NOTE — Discharge Instructions (Signed)
Follow-up in the cancer center on Monday.  Return here if you have persistent fever

## 2018-11-04 ENCOUNTER — Emergency Department (HOSPITAL_COMMUNITY): Payer: BLUE CROSS/BLUE SHIELD

## 2018-11-04 ENCOUNTER — Encounter (HOSPITAL_COMMUNITY): Payer: Self-pay

## 2018-11-04 ENCOUNTER — Inpatient Hospital Stay (HOSPITAL_COMMUNITY)
Admission: EM | Admit: 2018-11-04 | Discharge: 2018-11-08 | DRG: 070 | Disposition: A | Discharge status: Home / Self Care | Payer: BLUE CROSS/BLUE SHIELD | Attending: Internal Medicine | Admitting: Internal Medicine

## 2018-11-04 ENCOUNTER — Inpatient Hospital Stay (HOSPITAL_COMMUNITY): Payer: BLUE CROSS/BLUE SHIELD

## 2018-11-04 DIAGNOSIS — I639 Cerebral infarction, unspecified: Secondary | ICD-10-CM | POA: Diagnosis not present

## 2018-11-04 DIAGNOSIS — R7989 Other specified abnormal findings of blood chemistry: Secondary | ICD-10-CM | POA: Diagnosis present

## 2018-11-04 DIAGNOSIS — R748 Abnormal levels of other serum enzymes: Secondary | ICD-10-CM

## 2018-11-04 DIAGNOSIS — K766 Portal hypertension: Secondary | ICD-10-CM | POA: Diagnosis present

## 2018-11-04 DIAGNOSIS — R2990 Unspecified symptoms and signs involving the nervous system: Secondary | ICD-10-CM | POA: Diagnosis not present

## 2018-11-04 DIAGNOSIS — R945 Abnormal results of liver function studies: Secondary | ICD-10-CM | POA: Diagnosis not present

## 2018-11-04 DIAGNOSIS — I700 Atherosclerosis of aorta: Secondary | ICD-10-CM | POA: Diagnosis present

## 2018-11-04 DIAGNOSIS — R0602 Shortness of breath: Secondary | ICD-10-CM | POA: Diagnosis not present

## 2018-11-04 DIAGNOSIS — R809 Proteinuria, unspecified: Secondary | ICD-10-CM | POA: Diagnosis not present

## 2018-11-04 DIAGNOSIS — Z8249 Family history of ischemic heart disease and other diseases of the circulatory system: Secondary | ICD-10-CM

## 2018-11-04 DIAGNOSIS — R29700 NIHSS score 0: Secondary | ICD-10-CM | POA: Diagnosis present

## 2018-11-04 DIAGNOSIS — D6869 Other thrombophilia: Secondary | ICD-10-CM | POA: Diagnosis not present

## 2018-11-04 DIAGNOSIS — Z20828 Contact with and (suspected) exposure to other viral communicable diseases: Secondary | ICD-10-CM | POA: Diagnosis not present

## 2018-11-04 DIAGNOSIS — Z79899 Other long term (current) drug therapy: Secondary | ICD-10-CM | POA: Diagnosis not present

## 2018-11-04 DIAGNOSIS — K719 Toxic liver disease, unspecified: Secondary | ICD-10-CM | POA: Diagnosis present

## 2018-11-04 DIAGNOSIS — Z9689 Presence of other specified functional implants: Secondary | ICD-10-CM

## 2018-11-04 DIAGNOSIS — R161 Splenomegaly, not elsewhere classified: Secondary | ICD-10-CM | POA: Diagnosis present

## 2018-11-04 DIAGNOSIS — I340 Nonrheumatic mitral (valve) insufficiency: Secondary | ICD-10-CM | POA: Diagnosis not present

## 2018-11-04 DIAGNOSIS — R32 Unspecified urinary incontinence: Secondary | ICD-10-CM | POA: Diagnosis not present

## 2018-11-04 DIAGNOSIS — R509 Fever, unspecified: Secondary | ICD-10-CM | POA: Diagnosis not present

## 2018-11-04 DIAGNOSIS — I10 Essential (primary) hypertension: Secondary | ICD-10-CM | POA: Diagnosis not present

## 2018-11-04 DIAGNOSIS — Z85828 Personal history of other malignant neoplasm of skin: Secondary | ICD-10-CM | POA: Diagnosis not present

## 2018-11-04 DIAGNOSIS — I619 Nontraumatic intracerebral hemorrhage, unspecified: Secondary | ICD-10-CM | POA: Diagnosis present

## 2018-11-04 DIAGNOSIS — K8681 Exocrine pancreatic insufficiency: Secondary | ICD-10-CM | POA: Diagnosis present

## 2018-11-04 DIAGNOSIS — D6959 Other secondary thrombocytopenia: Secondary | ICD-10-CM | POA: Diagnosis present

## 2018-11-04 DIAGNOSIS — G9341 Metabolic encephalopathy: Principal | ICD-10-CM | POA: Diagnosis present

## 2018-11-04 DIAGNOSIS — T451X5A Adverse effect of antineoplastic and immunosuppressive drugs, initial encounter: Secondary | ICD-10-CM | POA: Diagnosis present

## 2018-11-04 DIAGNOSIS — R410 Disorientation, unspecified: Secondary | ICD-10-CM | POA: Diagnosis not present

## 2018-11-04 DIAGNOSIS — B954 Other streptococcus as the cause of diseases classified elsewhere: Secondary | ICD-10-CM | POA: Diagnosis present

## 2018-11-04 DIAGNOSIS — D6481 Anemia due to antineoplastic chemotherapy: Secondary | ICD-10-CM | POA: Diagnosis present

## 2018-11-04 DIAGNOSIS — Z8349 Family history of other endocrine, nutritional and metabolic diseases: Secondary | ICD-10-CM | POA: Diagnosis not present

## 2018-11-04 DIAGNOSIS — C787 Secondary malignant neoplasm of liver and intrahepatic bile duct: Secondary | ICD-10-CM | POA: Diagnosis not present

## 2018-11-04 DIAGNOSIS — D649 Anemia, unspecified: Secondary | ICD-10-CM | POA: Diagnosis not present

## 2018-11-04 DIAGNOSIS — G9340 Encephalopathy, unspecified: Secondary | ICD-10-CM | POA: Diagnosis not present

## 2018-11-04 DIAGNOSIS — R4182 Altered mental status, unspecified: Secondary | ICD-10-CM | POA: Diagnosis present

## 2018-11-04 DIAGNOSIS — Z87442 Personal history of urinary calculi: Secondary | ICD-10-CM | POA: Diagnosis not present

## 2018-11-04 DIAGNOSIS — R4701 Aphasia: Secondary | ICD-10-CM | POA: Diagnosis present

## 2018-11-04 DIAGNOSIS — I6523 Occlusion and stenosis of bilateral carotid arteries: Secondary | ICD-10-CM | POA: Diagnosis not present

## 2018-11-04 DIAGNOSIS — C259 Malignant neoplasm of pancreas, unspecified: Secondary | ICD-10-CM | POA: Diagnosis not present

## 2018-11-04 DIAGNOSIS — R0902 Hypoxemia: Secondary | ICD-10-CM | POA: Diagnosis not present

## 2018-11-04 DIAGNOSIS — C250 Malignant neoplasm of head of pancreas: Secondary | ICD-10-CM | POA: Diagnosis present

## 2018-11-04 DIAGNOSIS — R7881 Bacteremia: Secondary | ICD-10-CM | POA: Diagnosis not present

## 2018-11-04 DIAGNOSIS — R58 Hemorrhage, not elsewhere classified: Secondary | ICD-10-CM | POA: Diagnosis not present

## 2018-11-04 DIAGNOSIS — K8309 Other cholangitis: Secondary | ICD-10-CM | POA: Diagnosis not present

## 2018-11-04 DIAGNOSIS — R4781 Slurred speech: Secondary | ICD-10-CM | POA: Diagnosis not present

## 2018-11-04 DIAGNOSIS — G629 Polyneuropathy, unspecified: Secondary | ICD-10-CM | POA: Diagnosis present

## 2018-11-04 DIAGNOSIS — Z95828 Presence of other vascular implants and grafts: Secondary | ICD-10-CM | POA: Diagnosis not present

## 2018-11-04 DIAGNOSIS — B955 Unspecified streptococcus as the cause of diseases classified elsewhere: Secondary | ICD-10-CM | POA: Diagnosis not present

## 2018-11-04 DIAGNOSIS — E785 Hyperlipidemia, unspecified: Secondary | ICD-10-CM | POA: Diagnosis not present

## 2018-11-04 DIAGNOSIS — K828 Other specified diseases of gallbladder: Secondary | ICD-10-CM | POA: Diagnosis not present

## 2018-11-04 DIAGNOSIS — R740 Nonspecific elevation of levels of transaminase and lactic acid dehydrogenase [LDH]: Secondary | ICD-10-CM | POA: Diagnosis not present

## 2018-11-04 DIAGNOSIS — A498 Other bacterial infections of unspecified site: Secondary | ICD-10-CM

## 2018-11-04 DIAGNOSIS — I629 Nontraumatic intracranial hemorrhage, unspecified: Secondary | ICD-10-CM | POA: Diagnosis not present

## 2018-11-04 LAB — COMPREHENSIVE METABOLIC PANEL
ALT: 109 U/L — ABNORMAL HIGH (ref 0–44)
AST: 124 U/L — ABNORMAL HIGH (ref 15–41)
Albumin: 2.5 g/dL — ABNORMAL LOW (ref 3.5–5.0)
Alkaline Phosphatase: 507 U/L — ABNORMAL HIGH (ref 38–126)
Anion gap: 13 (ref 5–15)
BUN: 12 mg/dL (ref 8–23)
CO2: 20 mmol/L — ABNORMAL LOW (ref 22–32)
Calcium: 8 mg/dL — ABNORMAL LOW (ref 8.9–10.3)
Chloride: 101 mmol/L (ref 98–111)
Creatinine, Ser: 1.02 mg/dL (ref 0.61–1.24)
GFR calc Af Amer: >60 mL/min (ref >60)
GFR calc non Af Amer: >60 mL/min (ref >60)
Glucose, Bld: 109 mg/dL — ABNORMAL HIGH (ref 70–99)
Potassium: 4 mmol/L (ref 3.5–5.1)
Sodium: 134 mmol/L — ABNORMAL LOW (ref 135–145)
Total Bilirubin: 1.7 mg/dL — ABNORMAL HIGH (ref 0.3–1.2)
Total Protein: 5.2 g/dL — ABNORMAL LOW (ref 6.5–8.1)

## 2018-11-04 LAB — URINALYSIS, ROUTINE W REFLEX MICROSCOPIC
Bacteria, UA: NONE SEEN
Bacteria, UA: NONE SEEN
Bilirubin Urine: NEGATIVE
Bilirubin Urine: NEGATIVE
Glucose, UA: NEGATIVE mg/dL
Glucose, UA: NEGATIVE mg/dL
Hgb urine dipstick: NEGATIVE
Ketones, ur: NEGATIVE mg/dL
Ketones, ur: 5 mg/dL — AB
Leukocytes,Ua: NEGATIVE
Leukocytes,Ua: NEGATIVE
Nitrite: NEGATIVE
Nitrite: NEGATIVE
Protein, ur: 100 mg/dL — AB
Protein, ur: NEGATIVE mg/dL
Specific Gravity, Urine: 1.023 (ref 1.005–1.030)
Specific Gravity, Urine: 1.044 — ABNORMAL HIGH (ref 1.005–1.030)
pH: 5 (ref 5.0–8.0)
pH: 6 (ref 5.0–8.0)

## 2018-11-04 LAB — PROTIME-INR
INR: 1.4 — ABNORMAL HIGH (ref 0.8–1.2)
Prothrombin Time: 17.3 seconds — ABNORMAL HIGH (ref 11.4–15.2)

## 2018-11-04 LAB — CBC
HCT: 31.3 % — ABNORMAL LOW (ref 39.0–52.0)
Hemoglobin: 10 g/dL — ABNORMAL LOW (ref 13.0–17.0)
MCH: 28 pg (ref 26.0–34.0)
MCHC: 31.9 g/dL (ref 30.0–36.0)
MCV: 87.7 fL (ref 80.0–100.0)
Platelets: DECREASED 10*3/uL (ref 150–400)
RBC: 3.57 MIL/uL — ABNORMAL LOW (ref 4.22–5.81)
RDW: 15 % (ref 11.5–15.5)
WBC: 12.6 10*3/uL — ABNORMAL HIGH (ref 4.0–10.5)
nRBC: 0 % (ref 0.0–0.2)

## 2018-11-04 LAB — SARS CORONAVIRUS 2 BY RT PCR (HOSPITAL ORDER, PERFORMED IN ~~LOC~~ HOSPITAL LAB): SARS Coronavirus 2: NEGATIVE

## 2018-11-04 LAB — DIFFERENTIAL
Abs Immature Granulocytes: 0.08 10*3/uL — ABNORMAL HIGH (ref 0.00–0.07)
Basophils Absolute: 0 10*3/uL (ref 0.0–0.1)
Basophils Relative: 0 %
Eosinophils Absolute: 0 10*3/uL (ref 0.0–0.5)
Eosinophils Relative: 0 %
Immature Granulocytes: 1 %
Lymphocytes Relative: 1 %
Lymphs Abs: 0.1 10*3/uL — ABNORMAL LOW (ref 0.7–4.0)
Monocytes Absolute: 0.4 10*3/uL (ref 0.1–1.0)
Monocytes Relative: 3 %
Neutro Abs: 12.1 10*3/uL — ABNORMAL HIGH (ref 1.7–7.7)
Neutrophils Relative %: 95 %

## 2018-11-04 LAB — LACTIC ACID, PLASMA
Lactic Acid, Venous: 1.1 mmol/L (ref 0.5–1.9)
Lactic Acid, Venous: 1.2 mmol/L (ref 0.5–1.9)
Lactic Acid, Venous: 1.6 mmol/L (ref 0.5–1.9)

## 2018-11-04 LAB — AMMONIA: Ammonia: 27 umol/L (ref 9–35)

## 2018-11-04 LAB — I-STAT CREATININE, ED: Creatinine, Ser: 0.9 mg/dL (ref 0.61–1.24)

## 2018-11-04 LAB — APTT: aPTT: 33 seconds (ref 24–36)

## 2018-11-04 MED ORDER — SODIUM CHLORIDE 0.9 % IV SOLN
2.0000 g | Freq: Once | INTRAVENOUS | Status: AC
  Administered 2018-11-04: 17:00:00 2 g via INTRAVENOUS
  Filled 2018-11-04: qty 2

## 2018-11-04 MED ORDER — ONDANSETRON HCL 4 MG PO TABS: 4.0000 mg | ORAL_TABLET | Freq: Four times a day (QID) | ORAL | Status: DC | PRN

## 2018-11-04 MED ORDER — VANCOMYCIN HCL IN DEXTROSE 1-5 GM/200ML-% IV SOLN: 1000.0000 mg | Freq: Once | INTRAVENOUS | Status: DC

## 2018-11-04 MED ORDER — SODIUM CHLORIDE 0.9% FLUSH
3.0000 mL | Freq: Once | INTRAVENOUS | Status: DC
Start: 2018-11-04 — End: 2018-11-08

## 2018-11-04 MED ORDER — ACETAMINOPHEN 325 MG PO TABS
650.0000 mg | ORAL_TABLET | Freq: Four times a day (QID) | ORAL | Status: DC | PRN
  Administered 2018-11-05 – 2018-11-08 (×3): 650 mg via ORAL
  Filled 2018-11-04 (×4): qty 2

## 2018-11-04 MED ORDER — LACTATED RINGERS IV BOLUS
1000.0000 mL | Freq: Once | INTRAVENOUS | Status: AC
  Administered 2018-11-04: 17:00:00 1000 mL via INTRAVENOUS

## 2018-11-04 MED ORDER — OXYCODONE-ACETAMINOPHEN 10-325 MG PO TABS: 1.0000 | ORAL_TABLET | ORAL | Status: DC | PRN

## 2018-11-04 MED ORDER — LOPERAMIDE HCL 2 MG PO CAPS: 2.0000 mg | ORAL_CAPSULE | Freq: Two times a day (BID) | ORAL | Status: DC | PRN

## 2018-11-04 MED ORDER — OXYCODONE-ACETAMINOPHEN 5-325 MG PO TABS
1.0000 | ORAL_TABLET | ORAL | Status: DC | PRN
  Administered 2018-11-05: 1 via ORAL
  Filled 2018-11-04: qty 1

## 2018-11-04 MED ORDER — SODIUM CHLORIDE 0.9 % IV SOLN
2.0000 g | Freq: Three times a day (TID) | INTRAVENOUS | Status: DC
  Administered 2018-11-05: 2 g via INTRAVENOUS
  Filled 2018-11-04: qty 2

## 2018-11-04 MED ORDER — VANCOMYCIN HCL 10 G IV SOLR
1250.0000 mg | Freq: Two times a day (BID) | INTRAVENOUS | Status: DC
  Administered 2018-11-05: 1250 mg via INTRAVENOUS
  Filled 2018-11-04 (×5): qty 1250

## 2018-11-04 MED ORDER — IOHEXOL 350 MG/ML SOLN
75.0000 mL | Freq: Once | INTRAVENOUS | Status: AC | PRN
  Administered 2018-11-04: 75 mL via INTRAVENOUS

## 2018-11-04 MED ORDER — METRONIDAZOLE IN NACL 5-0.79 MG/ML-% IV SOLN
500.0000 mg | Freq: Once | INTRAVENOUS | Status: AC
  Administered 2018-11-04: 500 mg via INTRAVENOUS
  Filled 2018-11-04: qty 100

## 2018-11-04 MED ORDER — VANCOMYCIN HCL 10 G IV SOLR
1750.0000 mg | Freq: Once | INTRAVENOUS | Status: AC
  Administered 2018-11-04: 1750 mg via INTRAVENOUS
  Filled 2018-11-04: qty 1750

## 2018-11-04 MED ORDER — OXYCODONE HCL 5 MG PO TABS
5.0000 mg | ORAL_TABLET | ORAL | Status: DC | PRN
  Administered 2018-11-07: 5 mg via ORAL
  Filled 2018-11-04: qty 1

## 2018-11-04 MED ORDER — CIPROFLOXACIN HCL 500 MG PO TABS: 500.0000 mg | ORAL_TABLET | Freq: Two times a day (BID) | ORAL | 0 refills | Status: DC

## 2018-11-04 MED ORDER — ACETAMINOPHEN 650 MG RE SUPP: 650.0000 mg | Freq: Four times a day (QID) | RECTAL | Status: DC | PRN

## 2018-11-04 MED ORDER — ACETAMINOPHEN 500 MG PO TABS
1000.0000 mg | ORAL_TABLET | Freq: Once | ORAL | Status: AC
  Administered 2018-11-04: 1000 mg via ORAL
  Filled 2018-11-04: qty 2

## 2018-11-04 MED ORDER — ENOXAPARIN SODIUM 40 MG/0.4ML ~~LOC~~ SOLN
40.0000 mg | SUBCUTANEOUS | Status: DC
  Administered 2018-11-04 – 2018-11-07 (×4): 40 mg via SUBCUTANEOUS
  Filled 2018-11-04 (×4): qty 0.4

## 2018-11-04 MED ORDER — HEPARIN SOD (PORK) LOCK FLUSH 100 UNIT/ML IV SOLN
500.0000 [IU] | Freq: Once | INTRAVENOUS | Status: AC
  Administered 2018-11-04: 500 [IU]
  Filled 2018-11-04: qty 5

## 2018-11-04 MED ORDER — LOPERAMIDE HCL 2 MG PO TABS: 2.0000 mg | ORAL_TABLET | Freq: Two times a day (BID) | ORAL | Status: DC | PRN

## 2018-11-04 NOTE — ED Provider Notes (Signed)
[facility] EMERGENCY DEPARTMENT Provider Note   CSN: [id number] Arrival date & time: 11/04/18  1540    History   Chief Complaint Chief Complaint  Patient presents with   Code Stroke    HPI Cory Kelly is a 63 y.o. male.     HPI  63 year old male with a history of cancer currently on chemotherapy presents with fever and altered mental status.  History taken from nurse who spoke to EMS as well as the patient.  He was originally called a code stroke for altered mental status and found to be febrile to 103.1.  The patient has been having at least a low-grade temperature for the last 2 days.  He went to [facility] where no source could be found and he was placed on ciprofloxacin.  Took a dose at noon today.  According to EMS, the patient was confused with some a aphasia and seemed to be looking like he was going to be unresponsive.  Here he has been much better. He denies any confusion. He had a mild headache a couple days ago but none now. No neck pain. Mild chronic cough, intermittent and unchanged. No urinary symptoms or abdominal pain. No rashes.   Past Medical History:  Diagnosis Date   Abdominal pain    due to bloating   Bloating    Cancer (HCC)    skin cancer   Essential hypertension    Fatigue    History of kidney stones    passed   History of weight loss    HOH (hard of hearing)    no hearing aids   Neuralgia    Pancreatic cancer [facility])    Stress    loss of father in Dec 2018.    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   Jaundice 07/20/2018   History of ERCP 07/20/2018   Mass of pancreas 07/20/2018   Goals of care, counseling/discussion 07/04/2018   Cholangitis 06/22/2018   Port-A-Cath in place 08/31/2017   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[place]) 08/21/2017   Abnormal  pancreas function test    Pancreatic abnormality    History Basal cell adenocarcinoma 08/04/2017   Elevated LFTs 08/04/2017   Tinnitus-bilat 03/05/2014   Elevated serum creatinine 11/20/2012   Elevated PSA 03/05/2012   Essential hypertension 11/02/2011   Hyperlipemia 11/02/2011    Past Surgical History:  Procedure Laterality Date   APPENDECTOMY     BILIARY STENT PLACEMENT N/A 06/25/2018   Procedure: BILIARY STENT PLACEMENT;  Surgeon: Irving Copas., MD;  Location: [facility] ENDOSCOPY;  Service: Gastroenterology;  Laterality: N/A;   BILIARY STENT PLACEMENT  07/20/2018   Procedure: BILIARY STENT PLACEMENT;  Surgeon: Milus Banister, MD;  Location: [facility] ENDOSCOPY;  Service: Gastroenterology;;   BILIARY STENT PLACEMENT N/A 08/16/2018   Procedure: BILIARY STENT PLACEMENT;  Surgeon: Milus Banister, MD;  Location: [facility] ENDOSCOPY;  Service: Endoscopy;  Laterality: N/A;   ENDOSCOPIC RETROGRADE CHOLANGIOPANCREATOGRAPHY (ERCP) WITH PROPOFOL N/A 08/10/2017   Procedure: ENDOSCOPIC RETROGRADE CHOLANGIOPANCREATOGRAPHY (ERCP) WITH PROPOFOL;  Surgeon: Milus Banister, MD;  Location: [facility] ENDOSCOPY;  Service: Endoscopy;  Laterality: N/A;   ENDOSCOPIC RETROGRADE CHOLANGIOPANCREATOGRAPHY (ERCP) WITH PROPOFOL N/A 07/20/2018   Procedure: ENDOSCOPIC RETROGRADE CHOLANGIOPANCREATOGRAPHY (ERCP) WITH PROPOFOL;  Surgeon: Milus Banister, MD;  Location: [facility] ENDOSCOPY;  Service: Gastroenterology;  Laterality: N/A;  Balloon Sweeps  ENDOSCOPIC RETROGRADE CHOLANGIOPANCREATOGRAPHY (ERCP) WITH PROPOFOL N/A 08/16/2018   Procedure: ENDOSCOPIC RETROGRADE CHOLANGIOPANCREATOGRAPHY (ERCP) WITH PROPOFOL;  Surgeon: Milus Banister, MD;  Location: [facility] ENDOSCOPY;  Service: Endoscopy;  Laterality: N/A;   ERCP N/A 06/25/2018   Procedure: ENDOSCOPIC RETROGRADE CHOLANGIOPANCREATOGRAPHY (ERCP);  Surgeon: Irving Copas., MD;  Location: [facility] ENDOSCOPY;  Service: Gastroenterology;  Laterality: N/A;   EUS N/A 08/10/2017    Procedure: UPPER ENDOSCOPIC ULTRASOUND (EUS) RADIAL;  Surgeon: Milus Banister, MD;  Location: [facility] ENDOSCOPY;  Service: Endoscopy;  Laterality: N/A;   EYE SURGERY     lasik/left eye   IR FLUORO GUIDE PORT INSERTION RIGHT  08/30/2017   IR US GUIDE VASC ACCESS RIGHT  08/30/2017   REMOVAL OF STONES  06/25/2018   Procedure: REMOVAL OF STONES;  Surgeon: Irving Copas., MD;  Location: [facility] ENDOSCOPY;  Service: Gastroenterology;;   SKIN CANCER EXCISION     STENT REMOVAL  07/20/2018   Procedure: STENT REMOVAL;  Surgeon: Milus Banister, MD;  Location: [facility] ENDOSCOPY;  Service: Gastroenterology;;   Lavell Islam REMOVAL  08/16/2018   Procedure: STENT REMOVAL;  Surgeon: Milus Banister, MD;  Location: [facility] ENDOSCOPY;  Service: Endoscopy;;        Home Medications    Prior to Admission medications   Medication Sig Start Date End Date Taking? Authorizing Provider  acetaminophen (TYLENOL) 500 MG tablet Take 500-1,000 mg by mouth every 6 (six) hours as needed for moderate pain.   Yes [provider]  amLODipine (NORVASC) 10 MG tablet Take 10 mg by mouth at bedtime.  05/21/18  Yes [provider]  ciprofloxacin (CIPRO) 500 MG tablet Take 1 tablet (500 mg total) by mouth 2 (two) times daily. One po bid x 7 days Patient taking differently: Take 500 mg by mouth 2 (two) times daily.  11/04/18  Yes Molpus, John, MD  gabapentin (NEURONTIN) 100 MG capsule Take 2 capsules (200 mg total) by mouth 2 (two) times daily. 10/01/18  Yes Ladell Pier, MD  lidocaine-prilocaine (EMLA) cream Apply 1 application topically as needed. Apply to portacath site 1-2 hours prior to use Patient taking differently: Apply 1 application topically daily as needed (1-2 hours prior to portacath access).  08/20/18  Yes Owens Shark, NP  loperamide (IMODIUM A-D) 2 MG tablet Take 1 tablet (2 mg total) by mouth as directed. Patient taking differently: Take 2-4 mg by mouth 3 (three) times daily as needed for diarrhea or  loose stools.  08/22/18  Yes Ladell Pier, MD  olmesartan (BENICAR) 40 MG tablet Take 1 tablet (40 mg total) by mouth daily. 04/04/18  Yes Weber, Sarah L, PA-C  ondansetron (ZOFRAN) 4 MG tablet Take 1 tablet (4 mg total) by mouth every 6 (six) hours as needed for nausea. 06/25/18  Yes Emokpae, Courage, MD  oxyCODONE-acetaminophen (PERCOCET) 10-325 MG tablet Take 1 tablet by mouth every 4 (four) hours as needed for pain. 10/31/18  Yes Ladell Pier, MD  potassium chloride SA (K-DUR,KLOR-CON) 20 MEQ tablet Take 1 tablet (20 mEq total) by mouth 2 (two) times daily. 10/16/18  Yes Ladell Pier, MD  [facility] ACETATE EX Apply 1 application topically daily as needed (itching).   Yes [provider]  ciprofloxacin (CIPRO) 500 MG tablet Take 1 tablet (500 mg total) by mouth 2 (two) times daily. Patient not taking: Reported on 11/04/2018 11/03/18   Lacretia Leigh, MD    Family History Family History  Problem Relation Age of Onset   Hyperlipidemia Father  Heart disease Father    Dementia Father    Memory loss Mother    Colon cancer Neg Hx    Esophageal cancer Neg Hx    Inflammatory bowel disease Neg Hx    Liver disease Neg Hx    Rectal cancer Neg Hx    Stomach cancer Neg Hx    Pancreatic cancer Neg Hx     Social History Social History   Tobacco Use   Smoking status: Never Smoker   Smokeless tobacco: Never Used  Substance Use Topics   Alcohol use: No   Drug use: No     Allergies   Patient has no known allergies.   Review of Systems Review of Systems  Constitutional: Positive for fever.  Respiratory: Positive for cough (chronic). Negative for shortness of breath.   Gastrointestinal: Positive for vomiting (yesterday x 2). Negative for abdominal pain.  Genitourinary: Negative for dysuria.  Musculoskeletal: Negative for neck pain.  Neurological: Positive for headaches (2 days ago, none now).  All other systems reviewed and are  negative.    Physical Exam Updated Vital Signs BP 125/72    Pulse 98    Temp 99.2 F (37.3 C) (Oral)    Resp 17    SpO2 96%   Physical Exam Vitals signs and nursing note reviewed.  Constitutional:      General: He is not in acute distress.    Appearance: He is well-developed. He is not ill-appearing or diaphoretic.  HENT:     Head: Normocephalic and atraumatic.     Right Ear: External ear normal.     Left Ear: External ear normal.     Nose: Nose normal.  Eyes:     General:        Right eye: No discharge.        Left eye: No discharge.     Extraocular Movements: Extraocular movements intact.     Pupils: Pupils are equal, round, and reactive to light.  Neck:     Musculoskeletal: Neck supple.  Cardiovascular:     Rate and Rhythm: Normal rate and regular rhythm.     Heart sounds: Normal heart sounds.  Pulmonary:     Effort: Pulmonary effort is normal.     Breath sounds: Normal breath sounds.  Abdominal:     Palpations: Abdomen is soft.     Tenderness: There is no abdominal tenderness.  Skin:    General: Skin is warm and dry.  Neurological:     Mental Status: He is alert and oriented to person, place, and time.     Comments: CN 3-12 grossly intact. 5/5 strength in all 4 extremities. Grossly normal sensation. Normal finger to nose.   Psychiatric:        Mood and Affect: Mood is not anxious.      ED Treatments / Results  Labs (all labs ordered are listed, but only abnormal results are displayed) Labs Reviewed  PROTIME-INR - Abnormal; Notable for the following components:      Result Value   Prothrombin Time 17.3 (*)    INR 1.4 (*)    All other components within normal limits  CBC - Abnormal; Notable for the following components:   WBC 12.6 (*)    RBC 3.57 (*)    Hemoglobin 10.0 (*)    HCT 31.3 (*)    All other components within normal limits  DIFFERENTIAL - Abnormal; Notable for the following components:   Neutro Abs 12.1 (*)    Lymphs Abs  0.1 (*)    Abs  Immature Granulocytes 0.08 (*)    All other components within normal limits  COMPREHENSIVE METABOLIC PANEL - Abnormal; Notable for the following components:   Sodium 134 (*)    CO2 20 (*)    Glucose, Bld 109 (*)    Calcium 8.0 (*)    Total Protein 5.2 (*)    Albumin 2.5 (*)    AST 124 (*)    ALT 109 (*)    Alkaline Phosphatase 507 (*)    Total Bilirubin 1.7 (*)    All other components within normal limits  CULTURE, BLOOD (ROUTINE X 2)  CULTURE, BLOOD (ROUTINE X 2)  URINE CULTURE  SARS CORONAVIRUS 2 (HOSPITAL ORDER, PERFORMED IN [facility])  APTT  LACTIC ACID, PLASMA  LACTIC ACID, PLASMA  URINALYSIS, ROUTINE W REFLEX MICROSCOPIC  I-STAT CREATININE, ED  CBG MONITORING, ED    EKG None  Radiology Ct Angio Head W Or Wo Contrast  Result Date: 11/04/2018 CLINICAL DATA:  Aphasia.  Decreased responsiveness. EXAM: CT ANGIOGRAPHY HEAD AND NECK TECHNIQUE: Multidetector CT imaging of the head and neck was performed using the standard protocol during bolus administration of intravenous contrast. Multiplanar CT image reconstructions and MIPs were obtained to evaluate the vascular anatomy. Carotid stenosis measurements (when applicable) are obtained utilizing NASCET criteria, using the distal internal carotid diameter as the denominator. CONTRAST:  39mL OMNIPAQUE IOHEXOL 350 MG/ML SOLN COMPARISON:  None. FINDINGS: CTA NECK FINDINGS Aortic arch: Standard 3 vessel aortic arch with mild atherosclerotic plaque. Widely patent arch vessel origins. Right carotid system: Patent with mild plaque about the carotid bifurcation. No evidence of significant stenosis or dissection. Left carotid system: Patent with mild plaque about the carotid bifurcation. No evidence of significant stenosis or dissection. Vertebral arteries: Patent without evidence of significant stenosis or dissection. Nonstenotic calcified plaque at the right vertebral artery origin. Mildly dominant left vertebral artery. Skeleton:  Mild cervical spondylosis. Other neck: No neck mass or enlarged lymph nodes identified. Upper chest: Clear lung apices. Review of the MIP images confirms the above findings CTA HEAD FINDINGS Anterior circulation: The internal carotid arteries are patent from skull base to carotid termini with mild nonstenotic plaque bilaterally. ACAs and MCAs are patent with branch vessel irregularity but no evidence of proximal branch occlusion or significant proximal stenosis. No aneurysm is identified. Posterior circulation: The intracranial vertebral arteries are patent to the basilar. Patent left PICA and bilateral SCAs are visualized. AICAs and a right PICA are not clearly identified. The basilar artery is widely patent. Posterior communicating arteries are not clearly identified and may be diminutive or absent. PCAs are patent without evidence of significant proximal stenosis. No aneurysm is identified. Venous sinuses: Patent. Anatomic variants: None. Review of the MIP images confirms the above findings IMPRESSION: 1. No arch vessel occlusion. 2. Mild atherosclerosis in the head and neck without significant stenosis. 3.  Aortic Atherosclerosis (ICD10-I70.0). These results were communicated to Dr. Lorraine Lax at 4:19 pm on 11/04/2018 by text page via the [facility] messaging system. Electronically Signed   By: Logan Bores M.D.   On: 11/04/2018 16:33   Ct Angio Neck W Or Wo Contrast  Result Date: 11/04/2018 CLINICAL DATA:  Aphasia.  Decreased responsiveness. EXAM: CT ANGIOGRAPHY HEAD AND NECK TECHNIQUE: Multidetector CT imaging of the head and neck was performed using the standard protocol during bolus administration of intravenous contrast. Multiplanar CT image reconstructions and MIPs were obtained to evaluate the vascular anatomy. Carotid stenosis measurements (when applicable)  are obtained utilizing NASCET criteria, using the distal internal carotid diameter as the denominator. CONTRAST:  71mL OMNIPAQUE IOHEXOL 350 MG/ML SOLN  COMPARISON:  None. FINDINGS: CTA NECK FINDINGS Aortic arch: Standard 3 vessel aortic arch with mild atherosclerotic plaque. Widely patent arch vessel origins. Right carotid system: Patent with mild plaque about the carotid bifurcation. No evidence of significant stenosis or dissection. Left carotid system: Patent with mild plaque about the carotid bifurcation. No evidence of significant stenosis or dissection. Vertebral arteries: Patent without evidence of significant stenosis or dissection. Nonstenotic calcified plaque at the right vertebral artery origin. Mildly dominant left vertebral artery. Skeleton: Mild cervical spondylosis. Other neck: No neck mass or enlarged lymph nodes identified. Upper chest: Clear lung apices. Review of the MIP images confirms the above findings CTA HEAD FINDINGS Anterior circulation: The internal carotid arteries are patent from skull base to carotid termini with mild nonstenotic plaque bilaterally. ACAs and MCAs are patent with branch vessel irregularity but no evidence of proximal branch occlusion or significant proximal stenosis. No aneurysm is identified. Posterior circulation: The intracranial vertebral arteries are patent to the basilar. Patent left PICA and bilateral SCAs are visualized. AICAs and a right PICA are not clearly identified. The basilar artery is widely patent. Posterior communicating arteries are not clearly identified and may be diminutive or absent. PCAs are patent without evidence of significant proximal stenosis. No aneurysm is identified. Venous sinuses: Patent. Anatomic variants: None. Review of the MIP images confirms the above findings IMPRESSION: 1. No arch vessel occlusion. 2. Mild atherosclerosis in the head and neck without significant stenosis. 3.  Aortic Atherosclerosis (ICD10-I70.0). These results were communicated to Dr. Lorraine Lax at 4:19 pm on 11/04/2018 by text page via the [facility] messaging system. Electronically Signed   By: Logan Bores M.D.   On:  11/04/2018 16:33   Dg Chest Portable 1 View  Result Date: 11/04/2018 CLINICAL DATA:  Fever, on chemotherapy EXAM: PORTABLE CHEST 1 VIEW COMPARISON:  11/03/2018 FINDINGS: There is a right-sided Port-A-Cath in satisfactory position. There is no focal parenchymal opacity. There is no pleural effusion or pneumothorax. The heart and mediastinal contours are unremarkable. The osseous structures are unremarkable. IMPRESSION: No active disease. Electronically Signed   By: Kathreen Devoid   On: 11/04/2018 17:03   Dg Chest Port 1 View  Result Date: 11/03/2018 CLINICAL DATA:  Fever.  History of pancreatic cancer. EXAM: PORTABLE CHEST 1 VIEW COMPARISON:  June 22, 2018 FINDINGS: The right Port-A-Cath is stable. The heart, hila, mediastinum, lungs, and pleura are normal. IMPRESSION: No active disease. Electronically Signed   By: Dorise Bullion III M.D   On: 11/03/2018 21:25   Ct Head Code Stroke Wo Contrast  Result Date: 11/04/2018 CLINICAL DATA:  Code stroke.  Aphasia.  Decreased responsiveness. EXAM: CT HEAD WITHOUT CONTRAST TECHNIQUE: Contiguous axial images were obtained from the base of the skull through the vertex without intravenous contrast. COMPARISON:  None. FINDINGS: Brain: There is an approximately 1 cm focus of hypoattenuation involving cortex and subcortical white matter in the right superior frontal gyrus suggestive of an infarct of indeterminate acuity. Patchy hypodensities are present elsewhere in the cerebral white matter bilaterally, nonspecific but compatible with mild chronic small vessel ischemic disease. No intracranial hemorrhage, mass, midline shift, or extra-axial fluid collection is identified. The ventricles and sulci are normal for age. Vascular: Calcified atherosclerosis at the skull base. No hyperdense vessel. Skull: No fracture or focal osseous lesion. Sinuses/Orbits: Air cell opacification at the right mastoid tip. Clear paranasal sinuses.  Unremarkable orbits. Other: None. ASPECTS  [facility] Stroke Program Early CT Score) 9 or 10 for the right MCA territory, 10 for the left MCA territory IMPRESSION: 1. Small age indeterminate infarct in the right superior frontal gyrus. 2. No intracranial hemorrhage. 3. Mild chronic small vessel ischemic disease. These results were communicated to Dr. Lorraine Lax at 4:19 pm on 11/04/2018 by text page via the [facility] messaging system. Electronically Signed   By: Logan Bores M.D.   On: 11/04/2018 16:20    Procedures Procedures (including critical care time)  Medications Ordered in ED Medications  sodium chloride flush (NS) 0.9 % injection 3 mL (has no administration in time range)  ceFEPIme (MAXIPIME) 2 g in sodium chloride 0.9 % 100 mL IVPB (2 g Intravenous New Bag/Given 11/04/18 1655)  metroNIDAZOLE (FLAGYL) IVPB 500 mg (has no administration in time range)  vancomycin (VANCOCIN) 1,750 mg in sodium chloride 0.9 % 500 mL IVPB (1,750 mg Intravenous New Bag/Given 11/04/18 1705)  iohexol (OMNIPAQUE) 350 MG/ML injection 75 mL (75 mLs Intravenous Contrast Given 11/04/18 1610)  acetaminophen (TYLENOL) tablet 1,000 mg (1,000 mg Oral Given 11/04/18 1635)  lactated ringers bolus 1,000 mL (1,000 mLs Intravenous New Bag/Given 11/04/18 1700)     Initial Impression / Assessment and Plan / ED Course  I have reviewed the triage vital signs and the nursing notes.  Pertinent labs & imaging results that were available during my care of the patient were reviewed by me and considered in my medical decision making (see chart for details).        Patient is not altered here.  Given this fact, I do not think he needs an emergent LP as I think encephalitis or meningitis is less likely.  There is also no clear cause of his fever.  However given his immunosuppression I think he will need IV antibiotics and monitoring in the hospital.  He is noted to have a mildly elevated bilirubin along with LFTs and so ultrasound be ordered given he has the biliary stent.  However since is not  altered now or having jaundice I think cholangitis is a lot less likely.  No abdominal pain.  Internal medicine teaching service to admit.  Final Clinical Impressions(s) / ED Diagnoses   Final diagnoses:  Fever in adult    ED Discharge Orders    None       Sherwood Gambler, MD 11/05/18 0005

## 2018-11-04 NOTE — H&P (Addendum)
Date: 11/04/2018               Patient Name:  Cory Kelly MRN: [id number]  DOB: March 31, 1956 Age / Sex: 63 y.o., male   PCP: Mancel Bale, PA-C         Medical Service: Internal Medicine Teaching Service         Attending Physician: Dr. Rebeca Alert Raynaldo Opitz, MD    First Contact: Dr. Annie Paras Pager: [phone]  Second Contact: Dr. Shan Levans Pager: [phone]       After Hours (After 5p/  First Contact Pager: [phone]  weekends / holidays): Second Contact Pager: [phone]   Chief Complaint: fever and confusion   History of Present Illness: [name] is a 63 yo man with a medical history of metastatic pancreatic adenocarcinoma s/p indwelling biliary stent and currently undergoing chemotherapy (completed most recent cycle of FOLFIRINOX on 10/16/2018), neuropathy, and HTN who presents with fever and confusion. He was seen in [facility] ED yesterday for fever to 101 and urinary frequency without pain. Covid testing was negative. UA was unremarkable except for proteinuria, and blood cx drawn are negative thus far. His case was discussed with the oncologist on call and was discharged home on ciprofloxacin and oncology f/u on 5/4. Per wife, this afternoon he had a fever to 101 and was shaking with chills. He vomited multiple times in bed and wet the bed. She thought he seemed disoriented and was unable to state the wife's name. She said he just stared at her when she was asking him questions. The patient himself remembers this episode, but doesn't remember feeling confused. He states that he feels normal now. He endorses chronic mild intermittent abdominal pain with bloating, decreased PO intake with anorexia, diarrhea, and weakness in his legs. He denies headache, vision changes, changes in speech, dizziness, chest pain, dyspnea, cough, current abdominal pain, dysuria, urinary frequency. He does feel like his legs were weaker bilaterally and felt unsteady on his feet.   Upon arrival to the ED, patient  was febrile to 103.1 and hemodynamically stable. Labs significant for leukocytosis of 12.6 with neutrophilia, Hb 10, plts clumped (thrombocytopenic to 117 yesterday), bicarb 20, AST/ALT 124/109 (normal 2 weeks ago), alk phos 507, bilirubin 1.7, lactate 1.2. CXR without edema or opacities. CT head with small indeterminage age infarct in the right superior frontal gyrus. CTA head and neck without acute abnormalities. He received tylenol, cefepime, flagyl, vancomycin, and 1L LR in the ED.   Meds:  Current Meds  Medication Sig   acetaminophen (TYLENOL) 500 MG tablet Take 500-1,000 mg by mouth every 6 (six) hours as needed for moderate pain.   amLODipine (NORVASC) 10 MG tablet Take 10 mg by mouth at bedtime.    ciprofloxacin (CIPRO) 500 MG tablet Take 1 tablet (500 mg total) by mouth 2 (two) times daily. One po bid x 7 days (Patient taking differently: Take 500 mg by mouth 2 (two) times daily. )   gabapentin (NEURONTIN) 100 MG capsule Take 2 capsules (200 mg total) by mouth 2 (two) times daily.   lidocaine-prilocaine (EMLA) cream Apply 1 application topically as needed. Apply to portacath site 1-2 hours prior to use (Patient taking differently: Apply 1 application topically daily as needed (1-2 hours prior to portacath access). )   loperamide (IMODIUM A-D) 2 MG tablet Take 1 tablet (2 mg total) by mouth as directed. (Patient taking differently: Take 2-4 mg by mouth 3 (three) times daily as needed for diarrhea or  loose stools. )   olmesartan (BENICAR) 40 MG tablet Take 1 tablet (40 mg total) by mouth daily.   ondansetron (ZOFRAN) 4 MG tablet Take 1 tablet (4 mg total) by mouth every 6 (six) hours as needed for nausea.   oxyCODONE-acetaminophen (PERCOCET) 10-325 MG tablet Take 1 tablet by mouth every 4 (four) hours as needed for pain.   potassium chloride SA (K-DUR,KLOR-CON) 20 MEQ tablet Take 1 tablet (20 mEq total) by mouth 2 (two) times daily.   PRAMOXINE-ZINC ACETATE EX Apply 1 application  topically daily as needed (itching).   Allergies: Allergies as of 11/04/2018   (No Known Allergies)   Past Medical History:  Diagnosis Date   Abdominal pain    due to bloating   Bloating    Cancer (HCC)    skin cancer   Essential hypertension    Fatigue    History of kidney stones    passed   History of weight loss    HOH (hard of hearing)    no hearing aids   Neuralgia    Pancreatic cancer [facility])    Stress    loss of father in Dec 2018.   Past Surgical History:  Procedure Laterality Date   APPENDECTOMY     BILIARY STENT PLACEMENT N/A 06/25/2018   Procedure: BILIARY STENT PLACEMENT;  Surgeon: Irving Copas., MD;  Location: [facility];  Service: Gastroenterology;  Laterality: N/A;   BILIARY STENT PLACEMENT  07/20/2018   Procedure: BILIARY STENT PLACEMENT;  Surgeon: Milus Banister, MD;  Location: [facility]COPY;  Service: Gastroenterology;;   BILIARY STENT PLACEMENT N/A 08/16/2018   Procedure: BILIARY STENT PLACEMENT;  Surgeon: Milus Banister, MD;  Location: [facility];  Service: Endoscopy;  Laterality: N/A;   ENDOSCOPIC RETROGRADE CHOLANGIOPANCREATOGRAPHY (ERCP) WITH PROPOFOL N/A 08/10/2017   Procedure: ENDOSCOPIC RETROGRADE CHOLANGIOPANCREATOGRAPHY (ERCP) WITH PROPOFOL;  Surgeon: Milus Banister, MD;  Location: [facility];  Service: Endoscopy;  Laterality: N/A;   ENDOSCOPIC RETROGRADE CHOLANGIOPANCREATOGRAPHY (ERCP) WITH PROPOFOL N/A 07/20/2018   Procedure: ENDOSCOPIC RETROGRADE CHOLANGIOPANCREATOGRAPHY (ERCP) WITH PROPOFOL;  Surgeon: Milus Banister, MD;  Location: [facility] ENDOSCOPY;  Service: Gastroenterology;  Laterality: N/A;  Balloon Sweeps   ENDOSCOPIC RETROGRADE CHOLANGIOPANCREATOGRAPHY (ERCP) WITH PROPOFOL N/A 08/16/2018   Procedure: ENDOSCOPIC RETROGRADE CHOLANGIOPANCREATOGRAPHY (ERCP) WITH PROPOFOL;  Surgeon: Milus Banister, MD;  Location: [facility];  Service: Endoscopy;  Laterality: N/A;   ERCP N/A 06/25/2018   Procedure: ENDOSCOPIC  RETROGRADE CHOLANGIOPANCREATOGRAPHY (ERCP);  Surgeon: Irving Copas., MD;  Location: [facility];  Service: Gastroenterology;  Laterality: N/A;   EUS N/A 08/10/2017   Procedure: UPPER ENDOSCOPIC ULTRASOUND (EUS) RADIAL;  Surgeon: Milus Banister, MD;  Location: [facility];  Service: Endoscopy;  Laterality: N/A;   EYE SURGERY     lasik/left eye   IR FLUORO GUIDE PORT INSERTION RIGHT  08/30/2017   IR US GUIDE VASC ACCESS RIGHT  08/30/2017   REMOVAL OF STONES  06/25/2018   Procedure: REMOVAL OF STONES;  Surgeon: Irving Copas., MD;  Location: [facility];  Service: Gastroenterology;;   SKIN CANCER EXCISION     STENT REMOVAL  07/20/2018   Procedure: STENT REMOVAL;  Surgeon: Milus Banister, MD;  Location: [place];  Service: Gastroenterology;;   Lavell Islam REMOVAL  08/16/2018   Procedure: STENT REMOVAL;  Surgeon: Milus Banister, MD;  Location: [facility];  Service: Endoscopy;;    Family History:  Family History  Problem Relation Age of Onset   Hyperlipidemia Father    Heart disease Father  Dementia Father    Memory loss Mother    Colon cancer Neg Hx    Esophageal cancer Neg Hx    Inflammatory bowel disease Neg Hx    Liver disease Neg Hx    Rectal cancer Neg Hx    Stomach cancer Neg Hx    Pancreatic cancer Neg Hx    Social History: Lives at home with wife. 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 Never smoker. Denies etoh or illicit drug use.   Review of Systems: A complete ROS was negative except as per HPI.   Physical Exam: Blood pressure 103/74, pulse 84, temperature 99.2 F (37.3 C), temperature source Oral, resp. rate 15, SpO2 96 %.  Constitutional: Alert and oriented x3. No distress.  Neuro: Patient does have slowed speech, but no dysarthria or aphasia. Naming is intact. Cranial nerves II-XII intact. 5/5 strength throughout. Normal sensation throughout. No dysmetria or tremor on finger to nose.  Cardiovascular: Normal rate  and regular rhythm. No murmurs, rubs, or gallops. Pulmonary/Chest: Effort normal. Clear to auscultation bilaterally. No wheezes, rales, or rhonchi. Port a cath in right upper chest without erythema or drainage. Abdominal: Bowel sounds present. Soft, non-distended, non-tender. Ext: No lower extremity edema. Skin: Warm and dry. Bilateral upper extremity petechiae (patient says chronic from bruising). No new rashes.  EKG: personally reviewed my interpretation is NSR without ischemic changes.   CXR: personally reviewed my interpretation is no edema or opacities.  Assessment & Plan by Problem: Active Problems:   Episode of transient neurologic symptoms  Mr. Lamba is a 63 yo man with a medical history of metastatic pancreatic adenocarcinoma s/p indwelling biliary stent and currently undergoing chemotherapy, neuropathy, and HTN who presented with fever to 103 and AMS. He was seen fever at the [facility] ED yesterday and was prescribed ciprofloxacin for possible UTI. He is currently hemodynamically stable and back to his baseline mentation.   Fever: Febrile to 103, responded well to tylenol. Neutrophilic leukocytosis. Lactate wnl. Had some tachycardia on arrival, but patient appears well and does not look septic or dehydrated. No obvious signs of infection. UA from yesterday was unremarkable. CXR normal. Port site doesn't look infected. He denies new abdominal pain or diarrhea. LFTs and alk phos are elevated, concerning for ascending cholangitis. His biliary stent was placed in 08/2017, and he required replacement of the stent 06/2018 due to debris buildup with elevated LFTs and bilirubin. He received broad spectrum abx and IVF in the ED. Will continue broad spectrum abx as patient is immunosuppressed. No concern for Covid as patient was negative x2 and this infectious appears more bacterial than viral.  - Continue broad spectrum abx with cefepime and vancomycin - RUQ [place]  - Tele - Tylenol prn for fever - F/u  urine culture and blood cx   Encephalopathy: Transient episode of confusion and unresponsiveness. He is now alert and oriented x3 and has no neuro deficits on exam today except for slowed speech. No hx of CVA. Risk factors include HTN and malignancy. CT head showed small indeterminage age hypoattenuation in the right superior frontal gyrus suspicious for infarct. CTA head and neck without acute abnormalities. Given his history of metastatic cancer, will do further imaging to rule out brain met.  - Neurology following, appreciate recs - Brain MRI  - Swallow eval  Elevated LFTs and hyperbilirubinemia: Newly elevated. Alk phos is also elevated, but this appears to be a chronic process. May represent malfunction of biliary stent. May also be from his  known liver mets. No jaundice on exam or abdominal pain.  - RUQ [place] - Trend CMP   Metastatic pancreatic adenocarcinoma: Follows with Dr. Benay Spice. Last chemo 3 weeks ago. Scheduled to resume chemo on 5/5. 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 He is not neutropenic.  - Holding home gabapentin 2/2 AMS - Continue home imodium prn  - Continue home zofran prn  - Continue home percocet 10-'325mg'$  for abdominal pain   HTN: Current Bps in the 702O systolic. Home regimen includes amlodipine '10mg'$  daily and olmesartan '40mg'$  daily.  - Holding home antihypertensives in the setting of infection and possible stroke  FEN: no IV fluids, NPO diet, replace electrolytes as needed  DVT ppx: Lovenox Code status: FULL code  Dispo: Admit patient to Inpatient with expected length of stay greater than 2 midnights.  Signed: Corinne Ports, MD 11/04/2018, 7:14 PM  Pager: [phone]

## 2018-11-04 NOTE — ED Notes (Signed)
Christiaan Strebeck, wife, [phone] is outside in front of ED

## 2018-11-04 NOTE — ED Triage Notes (Signed)
Pt seen yesterday and negative COVID test, symptom was fever. Today, fever is 103.1. Called out for confusion starting at 1130, for [facility] confused and intermittent responsiveness, code stroke called en route. AOx4 on arrival, no deficits

## 2018-11-04 NOTE — ED Provider Notes (Addendum)
Nursing notes and vitals signs, including pulse oximetry, reviewed.  Summary of this visit's results, reviewed by myself:  Labs:  Results for orders placed or performed during the hospital encounter of 11/03/18 (from the past 24 hour(s))  Lactic acid, plasma     Status: Abnormal   Collection Time: 11/03/18  9:19 PM  Result Value Ref Range   Lactic Acid, Venous 2.3 (HH) 0.5 - 1.9 mmol/L  Comprehensive metabolic panel     Status: Abnormal   Collection Time: 11/03/18  9:19 PM  Result Value Ref Range   Sodium 136 135 - 145 mmol/L   Potassium 3.5 3.5 - 5.1 mmol/L   Chloride 99 98 - 111 mmol/L   CO2 24 22 - 32 mmol/L   Glucose, Bld 87 70 - 99 mg/dL   BUN 7 (L) 8 - 23 mg/dL   Creatinine, Ser 0.89 0.61 - 1.24 mg/dL   Calcium 8.6 (L) 8.9 - 10.3 mg/dL   Total Protein 6.2 (L) 6.5 - 8.1 g/dL   Albumin 3.2 (L) 3.5 - 5.0 g/dL   AST 152 (H) 15 - 41 U/L   ALT 90 (H) 0 - 44 U/L   Alkaline Phosphatase 672 (H) 38 - 126 U/L   Total Bilirubin 2.4 (H) 0.3 - 1.2 mg/dL   GFR calc non Af Amer >60 >60 mL/min   GFR calc Af Amer >60 >60 mL/min   Anion gap 13 5 - 15  CBC WITH DIFFERENTIAL     Status: Abnormal   Collection Time: 11/03/18  9:19 PM  Result Value Ref Range   WBC 10.5 4.0 - 10.5 K/uL   RBC 4.36 4.22 - 5.81 MIL/uL   Hemoglobin 12.1 (L) 13.0 - 17.0 g/dL   HCT 38.5 (L) 39.0 - 52.0 %   MCV 88.3 80.0 - 100.0 fL   MCH 27.8 26.0 - 34.0 pg   MCHC 31.4 30.0 - 36.0 g/dL   RDW 15.0 11.5 - 15.5 %   Platelets 117 (L) 150 - 400 K/uL   nRBC 0.0 0.0 - 0.2 %   Neutrophils Relative % 98 %   Neutro Abs 10.2 (H) 1.7 - 7.7 K/uL   Lymphocytes Relative 1 %   Lymphs Abs 0.1 (L) 0.7 - 4.0 K/uL   Monocytes Relative 0 %   Monocytes Absolute 0.0 (L) 0.1 - 1.0 K/uL   Eosinophils Relative 0 %   Eosinophils Absolute 0.0 0.0 - 0.5 K/uL   Basophils Relative 0 %   Basophils Absolute 0.0 0.0 - 0.1 K/uL   Immature Granulocytes 1 %   Abs Immature Granulocytes 0.08 (H) 0.00 - 0.07 K/uL  Urinalysis, Routine w reflex  microscopic     Status: Abnormal   Collection Time: 11/03/18  9:19 PM  Result Value Ref Range   Color, Urine AMBER (A) YELLOW   APPearance HAZY (A) CLEAR   Specific Gravity, Urine 1.023 1.005 - 1.030   pH 5.0 5.0 - 8.0   Glucose, UA NEGATIVE NEGATIVE mg/dL   Hgb urine dipstick NEGATIVE NEGATIVE   Bilirubin Urine NEGATIVE NEGATIVE   Ketones, ur NEGATIVE NEGATIVE mg/dL   Protein, ur 100 (A) NEGATIVE mg/dL   Nitrite NEGATIVE NEGATIVE   Leukocytes,Ua NEGATIVE NEGATIVE   RBC / HPF 0-5 0 - 5 RBC/hpf   WBC, UA 6-10 0 - 5 WBC/hpf   Bacteria, UA NONE SEEN NONE SEEN   Squamous Epithelial / LPF 0-5 0 - 5   Mucus PRESENT    Hyaline Casts, UA PRESENT   SARS  Coronavirus 2 [facility] order, Performed in [place] hospital lab)     Status: None   Collection Time: 11/03/18  9:19 PM  Result Value Ref Range   SARS Coronavirus 2 NEGATIVE NEGATIVE  Lactic acid, plasma     Status: None   Collection Time: 11/03/18 11:32 PM  Result Value Ref Range   Lactic Acid, Venous 1.6 0.5 - 1.9 mmol/L    Imaging Studies: Dg Chest Port 1 View  Result Date: 11/03/2018 CLINICAL DATA:  Fever.  History of pancreatic cancer. EXAM: PORTABLE CHEST 1 VIEW COMPARISON:  June 22, 2018 FINDINGS: The right Port-A-Cath is stable. The heart, hila, mediastinum, lungs, and pleura are normal. IMPRESSION: No active disease. Electronically Signed   By: Dorise Bullion III M.D   On: 11/03/2018 21:25   Urinalysis is not diagnostic for urinary tract infection but we will start the patient on ciprofloxacin as discussed by Dr. Zenia Resides with the patient's oncologist.   Shanon Rosser, MD 11/04/18 0011    Ercel Normoyle, Jenny Reichmann, MD 11/04/18 9201

## 2018-11-04 NOTE — Consult Note (Addendum)
NEURO HOSPITALIST  CONSULT   Requesting Physician: Dr. Regenia Skeeter    Chief Complaint: aphasia/unresponsiveness  History obtained from:  Patient     HPI:                                                                                                                                         Cory Kelly is an 63 y.o. male  With significant PMH pancreatic cancer ( currently undergoing chemo), neuralgia, HTN, skin cancer, hard of hearing who presents to [facility] ED as a code stroke for aphasia and periods of unresponsiveness.   Of note was seen at [place] yesterday for a fever (101). Tested negative for Covid-19. Blood cultures show no growth < 12 hours. He was started on ciprofloxacin  500 mg BID. No prior stroke history. Today about 1130 he started having some trouble with language and  Confusion, apparently improved when EMS arrived.  He has been febrile to 103.1 today. En route [facility] noted that patient difficulty with his speech appeared confused and less responsive and code stroke called en route.  No facial droop, no focal weakness.  ED course:  CTH:no hemorrhage CTA: no LVO   Date last known well: 11/02/28 Time last known well: 1130 tPA Given: No: too mild and non disabling Modified Rankin: Rankin Score=0 NIHSS:0    Past Medical History:  Diagnosis Date  . Abdominal pain    due to bloating  . Bloating   . Cancer ([place])    skin cancer  . Essential hypertension   . Fatigue   . History of kidney stones    passed  . History of weight loss   . HOH (hard of hearing)    no hearing aids  . Neuralgia   . Pancreatic cancer ([place])   . Stress    loss of father in Dec 2018.    Past Surgical History:  Procedure Laterality Date  . APPENDECTOMY    . BILIARY STENT PLACEMENT N/A 06/25/2018   Procedure: BILIARY STENT PLACEMENT;  Surgeon: Rush Landmark Telford Nab., MD;  Location: [facility];  Service: Gastroenterology;  Laterality: N/A;  .  BILIARY STENT PLACEMENT  07/20/2018   Procedure: BILIARY STENT PLACEMENT;  Surgeon: Milus Banister, MD;  Location: [facility] ENDOSCOPY;  Service: Gastroenterology;;  . BILIARY STENT PLACEMENT N/A 08/16/2018   Procedure: BILIARY STENT PLACEMENT;  Surgeon: Milus Banister, MD;  Location: [facility] ENDOSCOPY;  Service: Endoscopy;  Laterality: N/A;  . ENDOSCOPIC RETROGRADE CHOLANGIOPANCREATOGRAPHY (ERCP) WITH PROPOFOL N/A 08/10/2017   Procedure: ENDOSCOPIC RETROGRADE CHOLANGIOPANCREATOGRAPHY (ERCP) WITH PROPOFOL;  Surgeon: Milus Banister, MD;  Location: [facility] ENDOSCOPY;  Service: Endoscopy;  Laterality: N/A;  . ENDOSCOPIC RETROGRADE CHOLANGIOPANCREATOGRAPHY (ERCP) WITH PROPOFOL N/A 07/20/2018   Procedure: ENDOSCOPIC RETROGRADE CHOLANGIOPANCREATOGRAPHY (ERCP) WITH PROPOFOL;  Surgeon: Milus Banister, MD;  Location: [facility] ENDOSCOPY;  Service: Gastroenterology;  Laterality: N/A;  Balloon Sweeps  . ENDOSCOPIC RETROGRADE CHOLANGIOPANCREATOGRAPHY (ERCP) WITH PROPOFOL N/A 08/16/2018   Procedure: ENDOSCOPIC RETROGRADE CHOLANGIOPANCREATOGRAPHY (ERCP) WITH PROPOFOL;  Surgeon: Milus Banister, MD;  Location: [facility] ENDOSCOPY;  Service: Endoscopy;  Laterality: N/A;  . ERCP N/A 06/25/2018   Procedure: ENDOSCOPIC RETROGRADE CHOLANGIOPANCREATOGRAPHY (ERCP);  Surgeon: Irving Copas., MD;  Location: [facility];  Service: Gastroenterology;  Laterality: N/A;  . EUS N/A 08/10/2017   Procedure: UPPER ENDOSCOPIC ULTRASOUND (EUS) RADIAL;  Surgeon: Milus Banister, MD;  Location: [facility] ENDOSCOPY;  Service: Endoscopy;  Laterality: N/A;  . EYE SURGERY     lasik/left eye  . IR FLUORO GUIDE PORT INSERTION RIGHT  08/30/2017  . IR US GUIDE VASC ACCESS RIGHT  08/30/2017  . REMOVAL OF STONES  06/25/2018   Procedure: REMOVAL OF STONES;  Surgeon: Rush Landmark Telford Nab., MD;  Location: [facility] ENDOSCOPY;  Service: Gastroenterology;;  . SKIN CANCER EXCISION    . STENT REMOVAL  07/20/2018   Procedure: STENT REMOVAL;  Surgeon: Milus Banister, MD;   Location: [place];  Service: Gastroenterology;;  . Lavell Islam REMOVAL  08/16/2018   Procedure: STENT REMOVAL;  Surgeon: Milus Banister, MD;  Location: [facility];  Service: Endoscopy;;    Family History  Problem Relation Age of Onset  . Hyperlipidemia Father   . Heart disease Father   . Dementia Father   . Memory loss Mother   . Colon cancer Neg Hx   . Esophageal cancer Neg Hx   . Inflammatory bowel disease Neg Hx   . Liver disease Neg Hx   . Rectal cancer Neg Hx   . Stomach cancer Neg Hx   . Pancreatic cancer Neg Hx        Social History:  reports that he has never smoked. He has never used smokeless tobacco. He reports that he does not drink alcohol or use drugs.  Allergies: No Known Allergies  Medications:                                                                                                                           Current Facility-Administered Medications  Medication Dose Route Frequency Provider Last Rate Last Dose  . sodium chloride flush (NS) 0.9 % injection 3 mL  3 mL Intravenous Once Sherwood Gambler, MD       Current Outpatient Medications  Medication Sig Dispense Refill  . acetaminophen (TYLENOL) 500 MG tablet Take 500-1,000 mg by mouth every 6 (six) hours as needed for moderate pain.    [name] Kitchen amLODipine (NORVASC) 10 MG tablet Take 10 mg by mouth at bedtime.     . ciprofloxacin (CIPRO) 500 MG tablet Take 1 tablet (500 mg total) by mouth 2 (two) times daily. 14 tablet 0  . ciprofloxacin (CIPRO)  500 MG tablet Take 1 tablet (500 mg total) by mouth 2 (two) times daily. One po bid x 7 days 14 tablet 0  . gabapentin (NEURONTIN) 100 MG capsule Take 2 capsules (200 mg total) by mouth 2 (two) times daily. 120 capsule 1  . lidocaine-prilocaine (EMLA) cream Apply 1 application topically as needed. Apply to portacath site 1-2 hours prior to use 30 g 2  . loperamide (IMODIUM A-D) 2 MG tablet Take 1 tablet (2 mg total) by mouth as directed. (Patient taking differently: Take  2 mg by mouth 3 (three) times daily as needed for diarrhea or loose stools. ) 30 tablet 0  . olmesartan (BENICAR) 40 MG tablet Take 1 tablet (40 mg total) by mouth daily. 90 tablet 0  . ondansetron (ZOFRAN) 4 MG tablet Take 1 tablet (4 mg total) by mouth every 6 (six) hours as needed for nausea. 20 tablet 0  . oxyCODONE-acetaminophen (PERCOCET) 10-325 MG tablet Take 1 tablet by mouth every 4 (four) hours as needed for pain. 75 tablet 0  . potassium chloride SA (K-DUR,KLOR-CON) 20 MEQ tablet Take 1 tablet (20 mEq total) by mouth 2 (two) times daily. 60 tablet 1  . PRAMOXINE-ZINC ACETATE EX Apply 1 application topically daily as needed (itching).       ROS:                                                                                                                                       ROS was performed and is negative except as noted in HPI    General Examination:                                                                                                      There were no vitals taken for this visit.  HEENT-  Normocephalic, no lesions, without obvious abnormality.  Normal external eye and conjunctiva. Cardiovascular- S1-S2 audible, pulses palpable throughout  Lungs-no rhonchi or wheezing noted, no excessive working breathing.  Saturations within normal  Extremities- Warm, dry and intact Musculoskeletal-no joint tenderness, deformity or swelling Skin-warm and dry, no hyperpigmentation, vitiligo, or suspicious lesions  Neurological Examination Mental Status: Alert, oriented, thought content appropriate.  Speech fluent without evidence of aphasia.  Able to read appropriately.  Repeat sentences.  Able to follow 3 step commands without difficulty. Cranial Nerves: II: visual fields grossly normal, pupils equal, round, reactive to light and accommodation III,IV, VI: ptosis not present, extraocular muscles extra-ocular motions intact bilaterally V,VII: smile  symmetric, facial light touch  sensation normal bilaterally VIII: hearing normal bilaterally IX,X: gag reflex present XI: trapezius strength/neck flexion strength normal bilaterally XII: tongue strength normal  Motor: Right : Upper extremity 5/5    Left:     Upper extremity  5/5  Lower extremity     Lower extremity 5/5 5 /5 Tone and bulk:normal tone throughout; no atrophy noted.  No asterixis noted.  No pronator drift Sensory: Pinprick and light touch intact throughout, bilaterally Plantars: Right: downgoing   Left: downgoing Cerebellar: normal finger-to-nose, normal rapid alternating movements and normal heel-to-shin test Gait not assessed   Lab Results: Basic Metabolic Panel: Recent Labs  Lab 11/03/18 2119  NA 136  K 3.5  CL 99  CO2 24  GLUCOSE 87  BUN 7*  CREATININE 0.89  CALCIUM 8.6*    CBC: Recent Labs  Lab 11/03/18 2119  WBC 10.5  NEUTROABS 10.2*  HGB 12.1*  HCT 38.5*  MCV 88.3  PLT 117*     Imaging: Dg Chest Port 1 View  Result Date: 11/03/2018 CLINICAL DATA:  Fever.  History of pancreatic cancer. EXAM: PORTABLE CHEST 1 VIEW COMPARISON:  June 22, 2018 FINDINGS: The right Port-A-Cath is stable. The heart, hila, mediastinum, lungs, and pleura are normal. IMPRESSION: No active disease. Electronically Signed   By: Dorise Bullion III M.D   On: 11/03/2018 21:25   Ct Head Code Stroke Wo Contrast  Result Date: 11/04/2018 CLINICAL DATA:  Code stroke.  Aphasia.  Decreased responsiveness. EXAM: CT HEAD WITHOUT CONTRAST TECHNIQUE: Contiguous axial images were obtained from the base of the skull through the vertex without intravenous contrast. COMPARISON:  None. FINDINGS: Brain: There is an approximately 1 cm focus of hypoattenuation involving cortex and subcortical white matter in the right superior frontal gyrus suggestive of an infarct of indeterminate acuity. Patchy hypodensities are present elsewhere in the cerebral white matter bilaterally, nonspecific but compatible with mild chronic small  vessel ischemic disease. No intracranial hemorrhage, mass, midline shift, or extra-axial fluid collection is identified. The ventricles and sulci are normal for age. Vascular: Calcified atherosclerosis at the skull base. No hyperdense vessel. Skull: No fracture or focal osseous lesion. Sinuses/Orbits: Air cell opacification at the right mastoid tip. Clear paranasal sinuses. Unremarkable orbits. Other: None. ASPECTS [facility] Stroke Program Early CT Score) 9 or 10 for the right MCA territory, 10 for the left MCA territory IMPRESSION: 1. Small age indeterminate infarct in the right superior frontal gyrus. 2. No intracranial hemorrhage. 3. Mild chronic small vessel ischemic disease. These results were communicated to Dr. Lorraine Lax at 4:19 pm on 11/04/2018 by text page via the [facility] messaging system. Electronically Signed   By: Logan Bores M.D.   On: 11/04/2018 16:20       Laurey Morale, MSN, NP-C [facility] [phone]  11/04/2018, 3:31 PM   Attending physician note to follow with Assessment and plan .   Assessment: 63 y.o. male With significant PMH pancreatic cancer ( currently undergoing chemo), neuralgia, HTN, skin cancer, hard of hearing who presents to [facility] ED as a code stroke for aphasia and periods of unresponsiveness. TPA not given d/t symptoms were too mild and non-disabling, low suspicion for stroke.. CTH: no hemorrhage. CTA; no LVO Stroke Risk Factors - hypercoagulable state from cancer and hypertension  Impression:  Acute metabolic encephalopathy Fever -evaluation pending Transaminitis, rule out hepatic encephalopathy R/o Acute ischemic stroke   Recommendations: --MRI brain w/wo contrast  -- sepsis work- up -- r/o covid-19 --Check ammonia --If he continues  waxing and waning, consider EEG  NEUROHOSPITALIST ADDENDUM Performed a face to face diagnostic evaluation including examination, history at the time of code stroke.  Note assisted by ARNP. I have reviewed the  contents of history and physical exam as documented by PA/ARNP/Resident and agree with above documentation.  Assessment and plan formulated by me.    Karena Addison  MD [facility] [phone]   If 7pm to 7am, please call on call as listed on AMION.

## 2018-11-04 NOTE — Progress Notes (Signed)
Pharmacy Antibiotic Note  Cory Kelly is a 63 y.o. male admitted on 11/04/2018 with sepsis.  Pharmacy has been consulted for vancomycin and cefepime dosing. Pt is febrile with Tmax 103.1 and WBC is elevated at 12.6. Scr is slightly above patients baseline at 1.02.   Plan: Vancomycin 1750mg  IV x 1 then 1250mg  IV Q12H Cefepime 2gm IV Q8H F/u renal fxn, C&S, clinical status and peak/trough at SS     Temp (24hrs), Avg:100.4 F (38 C), Min:99 F (37.2 C), Max:103.1 F (39.5 C)  Recent Labs  Lab 11/03/18 2119 11/03/18 2332 11/04/18 1545 11/04/18 1550  WBC 10.5  --  PENDING  --   CREATININE 0.89  --  1.02 0.90  LATICACIDVEN 2.3* 1.6  --   --     Estimated Creatinine Clearance: 105.8 mL/min (by C-G formula based on SCr of 0.9 mg/dL).    No Known Allergies  Antimicrobials this admission: Vanc 5/3>> Cefepime 5/3>> Flagyl x 1 5/3  Dose adjustments this admission: N/A  Microbiology results: Pending  Thank you for allowing pharmacy to be a part of this patient's care.  [name], Rande Lawman 11/04/2018 4:36 PM

## 2018-11-04 NOTE — ED Notes (Signed)
pts family came to ER , nurse navigator spoke to family , made them aware of no stroke but may have been TIA , pt with temp of 103, pt had just been in ER at [facility] yesterday, Family left his phone and charger with me and I gave it to him in red bag, I gave family nurse's phone # so they could call about 6 pm

## 2018-11-05 ENCOUNTER — Other Ambulatory Visit: Payer: BLUE CROSS/BLUE SHIELD

## 2018-11-05 ENCOUNTER — Inpatient Hospital Stay (HOSPITAL_COMMUNITY): Payer: BLUE CROSS/BLUE SHIELD

## 2018-11-05 ENCOUNTER — Ambulatory Visit (HOSPITAL_COMMUNITY)
Admission: RE | Admit: 2018-11-05 | Discharge: 2018-11-05 | Disposition: A | Discharge status: Home / Self Care | Payer: BLUE CROSS/BLUE SHIELD | Source: Ambulatory Visit | Attending: Oncology | Admitting: Oncology

## 2018-11-05 ENCOUNTER — Telehealth: Payer: Self-pay | Admitting: *Deleted

## 2018-11-05 DIAGNOSIS — G9340 Encephalopathy, unspecified: Secondary | ICD-10-CM

## 2018-11-05 DIAGNOSIS — R945 Abnormal results of liver function studies: Secondary | ICD-10-CM

## 2018-11-05 DIAGNOSIS — R509 Fever, unspecified: Secondary | ICD-10-CM

## 2018-11-05 DIAGNOSIS — I639 Cerebral infarction, unspecified: Secondary | ICD-10-CM

## 2018-11-05 LAB — COMPREHENSIVE METABOLIC PANEL
ALT: 87 U/L — ABNORMAL HIGH (ref 0–44)
AST: 75 U/L — ABNORMAL HIGH (ref 15–41)
Albumin: 2.2 g/dL — ABNORMAL LOW (ref 3.5–5.0)
Alkaline Phosphatase: 450 U/L — ABNORMAL HIGH (ref 38–126)
Anion gap: 10 (ref 5–15)
BUN: 12 mg/dL (ref 8–23)
CO2: 21 mmol/L — ABNORMAL LOW (ref 22–32)
Calcium: 7.8 mg/dL — ABNORMAL LOW (ref 8.9–10.3)
Chloride: 103 mmol/L (ref 98–111)
Creatinine, Ser: 0.86 mg/dL (ref 0.61–1.24)
GFR calc Af Amer: >60 mL/min (ref >60)
GFR calc non Af Amer: >60 mL/min (ref >60)
Glucose, Bld: 111 mg/dL — ABNORMAL HIGH (ref 70–99)
Potassium: 3.7 mmol/L (ref 3.5–5.1)
Sodium: 134 mmol/L — ABNORMAL LOW (ref 135–145)
Total Bilirubin: 1.2 mg/dL (ref 0.3–1.2)
Total Protein: 4.7 g/dL — ABNORMAL LOW (ref 6.5–8.1)

## 2018-11-05 LAB — CBC
HCT: 27.4 % — ABNORMAL LOW (ref 39.0–52.0)
Hemoglobin: 8.8 g/dL — ABNORMAL LOW (ref 13.0–17.0)
MCH: 27.6 pg (ref 26.0–34.0)
MCHC: 32.1 g/dL (ref 30.0–36.0)
MCV: 85.9 fL (ref 80.0–100.0)
Platelets: DECREASED 10*3/uL (ref 150–400)
RBC: 3.19 MIL/uL — ABNORMAL LOW (ref 4.22–5.81)
RDW: 15.1 % (ref 11.5–15.5)
WBC: 6.6 10*3/uL (ref 4.0–10.5)
nRBC: 0 % (ref 0.0–0.2)

## 2018-11-05 LAB — BLOOD CULTURE ID PANEL (REFLEXED)

## 2018-11-05 LAB — URINE CULTURE: Culture: NO GROWTH

## 2018-11-05 LAB — HIV ANTIBODY (ROUTINE TESTING W REFLEX): HIV Screen 4th Generation wRfx: NONREACTIVE

## 2018-11-05 LAB — GAMMA GT: GGT: 182 U/L — ABNORMAL HIGH (ref 7–50)

## 2018-11-05 MED ORDER — PIPERACILLIN-TAZOBACTAM 3.375 G IVPB
3.3750 g | Freq: Three times a day (TID) | INTRAVENOUS | Status: DC
  Administered 2018-11-05 – 2018-11-06 (×3): 3.375 g via INTRAVENOUS
  Filled 2018-11-05 (×4): qty 50

## 2018-11-05 MED ORDER — PIPERACILLIN-TAZOBACTAM 3.375 G IVPB 30 MIN
3.3750 g | Freq: Once | INTRAVENOUS | Status: AC
  Administered 2018-11-05: 3.375 g via INTRAVENOUS
  Filled 2018-11-05: qty 50

## 2018-11-05 MED ORDER — GADOBUTROL 1 MMOL/ML IV SOLN
9.0000 mL | Freq: Once | INTRAVENOUS | Status: AC | PRN
  Administered 2018-11-05: 9 mL via INTRAVENOUS

## 2018-11-05 NOTE — Progress Notes (Signed)
Pt unavailable for EEG due to being at another procedure right now. Will attempt at a later time when schedule permits

## 2018-11-05 NOTE — Progress Notes (Signed)
Subjective: No overnight events. Pt remained afebrile. Cory Kelly reports that he feels well this morning. Had one episode of chills this morning. Denies abdominal pain, nausea, vomiting, and confusion. He does not have a good appetite. Reports that he was scheduled to have an MRI of the liver per his oncologist Dr. Benay Spice today to evaluate for hepatic lesions. All questions and concerns were addressed.   Objective:  Vital signs in last 24 hours: Vitals:   11/05/18 0000 11/05/18 0151 11/05/18 0400 11/05/18 0500  BP: 118/70  138/81 138/81  Pulse: 79  70 70  Resp: '18  18 20  '$ Temp: 98.5 F (36.9 C)  97.9 F (36.6 C) 97.9 F (36.6 C)  TempSrc: Oral  Oral Oral  SpO2: 96%  100% 100%  Weight:  94.2 kg    Height:  '6\' 6"'$  (1.981 m)     Gen: alert and oriented x3, no distress CV: RRR, no murmurs Pulm: CTAB, normal effort on room air Abd: soft, mildly distended, non-tender Ext: no edema   RUQ [place] - Mild gallbladder wall thickening with pericholecystic fluid and sludge within the gallbladder. No tenderness over the gallbladder during the study or stones visualized. - Heterogeneous echotexture throughout the liver which may reflect focal/geographic fatty infiltration. - Prominent portal vein, 2.4 cm. Portal vein is patent with blood flow toward the liver.  Brain MRI 1. No acute intracranial abnormality identified. 2. Mild-to-moderate chronic microvascular ischemic changes and mild volume loss of the brain. 3. 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  Assessment/Plan:  Active Problems:   Episode of transient neurologic symptoms  Cory Kelly is a 63 yo man with a medical history of metastatic pancreatic adenocarcinoma s/p indwelling biliary stent and currently undergoing chemotherapy, neuropathy, and HTN who presented with fever to 103 and AMS.   Fever  Encephalopathy Elevated LFTs and hyperbilirubinemia - Currently afebrile and hemodynamically stable. Leukocytosis improved from 12.6 to 6.6. Blood cx from 5/2 are still negative to date. Alert and oriented today, at baseline mentation.  - RUQ [place] without obvious signs of ascending cholangitis or biliary stent malfunction. Downtrending LFTs, bilirubin, and alk phos. No abdominal pain on exam. No signs of cholangitis or biliary stent malfunction on RUQ [place]. However, given the patient's history and known biliary stent, cannot rule out biliary infection. Will continue antibiotics and discuss case with GI.  - MRI brain without contrast showed no sign of stroke, but did show acute microhemorrhages possibly representing amyloid angiopathy, chronic HTN, multiple septic emboli, microbleeds from pancreatic cancer, or familial cerebral cavernous malformation.  Plan - GI consulted, appreciate recs - Neurology following, appreciate recs  - Discontinue cefepime and vancomycin. Start zosyn. - F/u urine cx and blood cx (pt was on cipro when collected) - Tylenol prn for fever - Repeat MRI brain with contrast  - TTE. Will need TEE if negative - EEG as the microhemorrhages or septic emboli can precipitate seizure  Metastatic pancreatic adenocarcinoma: Follows with Dr. Benay Spice. Last chemo 3 weeks ago. 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 He is not neutropenic.  Discussed case with Dr. Benay Spice today. Patient was scheduled for MRI of the liver today and to resume chemo on 5/5.  - Holding home gabapentin 2/2 AMS - Continue home imodium prn  - Continue home zofran prn  - Continue home percocet 10-'325mg'$  for abdominal pain  - Liver MRI per Dr. Gearldine Shown request during this hospitalization when  possible  HTN: BP 138/81 this am. Home regimen includes amlodipine '10mg'$  daily and olmesartan '40mg'$  daily.  - Holding home antihypertensives in the setting of active infection.  Dispo: Anticipated discharge in  approximately 3-4 days.  Cory Kelly, Cory Elk, MD 11/05/2018, 6:20 AM Pager: [phone]

## 2018-11-05 NOTE — Progress Notes (Signed)
PHARMACY - PHYSICIAN COMMUNICATION CRITICAL VALUE ALERT - BLOOD CULTURE IDENTIFICATION (BCID)  Cory Kelly is an 63 y.o. male who presented to [facility] on 11/04/2018 with a chief complaint of fever and AMS. Patient has PMH of metastatic pancreatic adenocarcinoma s/p indwelling biliary stent currently receiving chemotherapy.  Assessment: 1 out of 4 bottles grew streptococcus spp. In blood cultures from 5/2   Name of physician (or Provider) Contacted: Vilma Prader, MD  Current antibiotics: Zosyn  Changes to prescribed antibiotics recommended:  Continue zosyn given mild gallbladder wall thickening with pericholecystic fluid and sludge within the gallbladder noted on RUQ [place] from 5/3.   Results for orders placed or performed during the hospital encounter of 11/03/18  Blood Culture ID Panel (Reflexed) (Collected: 11/03/2018  9:19 PM)  Result Value Ref Range   Enterococcus species NOT DETECTED NOT DETECTED   Listeria monocytogenes NOT DETECTED NOT DETECTED   Staphylococcus species NOT DETECTED NOT DETECTED   Staphylococcus aureus (BCID) NOT DETECTED NOT DETECTED   Streptococcus species DETECTED (A) NOT DETECTED   Streptococcus agalactiae NOT DETECTED NOT DETECTED   Streptococcus pneumoniae NOT DETECTED NOT DETECTED   Streptococcus pyogenes NOT DETECTED NOT DETECTED   Acinetobacter baumannii NOT DETECTED NOT DETECTED   Enterobacteriaceae species NOT DETECTED NOT DETECTED   Enterobacter cloacae complex NOT DETECTED NOT DETECTED   Escherichia coli NOT DETECTED NOT DETECTED   Klebsiella oxytoca NOT DETECTED NOT DETECTED   Klebsiella pneumoniae NOT DETECTED NOT DETECTED   Proteus species NOT DETECTED NOT DETECTED   Serratia marcescens NOT DETECTED NOT DETECTED   Haemophilus influenzae NOT DETECTED NOT DETECTED   Neisseria meningitidis NOT DETECTED NOT DETECTED   Pseudomonas aeruginosa NOT DETECTED NOT DETECTED   Candida albicans NOT DETECTED NOT DETECTED   Candida glabrata NOT DETECTED  NOT DETECTED   Candida krusei NOT DETECTED NOT DETECTED   Candida parapsilosis NOT DETECTED NOT DETECTED   Candida tropicalis NOT DETECTED NOT DETECTED    Thank you for allowing pharmacy to be a part of this patient's care.  Leron Croak, PharmD PGY1 Pharmacy Resident Phone: [phone]  Please check AMION for all [place] phone numbers 11/05/2018  12:36 PM

## 2018-11-05 NOTE — Plan of Care (Signed)
Progressing toward goals. 

## 2018-11-05 NOTE — Progress Notes (Signed)
  Echocardiogram 2D Echocardiogram has been performed.  Cory Kelly L Androw 11/05/2018, 10:49 AM

## 2018-11-05 NOTE — Progress Notes (Signed)
Pharmacy Antibiotic Note  Zackari Ruane is a 63 y.o. male admitted on 11/04/2018 with fever and confusion, started on broad-spectrum ABX, now w/ concern for possible cholangitis.  Pharmacy has been consulted for Zosyn dosing.  Plan: Zosyn 3.375g IV q8h (4-hour infusion).  Height: 6\' 6"  (198.1 cm) Weight: 207 lb 10.8 oz (94.2 kg) IBW/kg (Calculated) : 91.4  Temp (24hrs), Avg:98.9 F (37.2 C), Min:97.7 F (36.5 C), Max:103.1 F (39.5 C)  Recent Labs  Lab 11/03/18 2119 11/03/18 2332 11/04/18 1545 11/04/18 1550 11/04/18 1645 11/04/18 2013 11/05/18 0432  WBC 10.5  --  12.6*  --   --   --  6.6  CREATININE 0.89  --  1.02 0.90  --   --  0.86  LATICACIDVEN 2.3* 1.6  --   --  1.2 1.1  --     Estimated Creatinine Clearance: 116.6 mL/min (by C-G formula based on SCr of 0.86 mg/dL).    No Known Allergies  Thank you for allowing pharmacy to be a part of this patient's care.  Wynona Neat, PharmD, BCPS  11/05/2018 7:43 AM

## 2018-11-05 NOTE — Consult Note (Signed)
Referring Provider: [facility]  Primary Care Physician:  Mancel Bale, PA-C Primary Gastroenterologist: Owens Loffler, MD     Reason for Consultation:    Pancreatic cancer abnormal liver tests    ASSESSMENT / PLAN:    82.  63 year old male with metastatic pancreatic cancer on chemotherapy admitted with fever. Workup in progress.  Rise in liver tests corresponding with fevers but no biliary duct dilation on ultrasound, no abdominal pain, and his liver test have improved overnight.  -on broad spectrum antibiotics. Not sure biliary stent manipulation needed at this point  2. Speech changes / Confusion, ? Secondary to infection. MRI negative for CVA.but numerous foci of chronic microhemorrhage. Repeat brain MRI with contrast today negative for CVA or metastatic disease.    HPI:   Cory Kelly is a 63 y.o. male diagnosed with metastatic pancreatic adenocarcinoma in Feb 2019. At time of diagnosis we tried to place a biliary stent but were unable to cannulate bile duct. On 08/16/17 patient had ERCP by Dr. Harl Bowie at [facility], metal stent placed across area of CBD stenosis. Since then he has subsequent ERCP with [facility] for additional stenting / stent exchanges. He has been undergoing chemotherapy.   Most recent endoscopic studies:   08/16/18 ERCP Ardis Hughs) - The two plastic biliary stents were removedThe two plastic biliary stents were removed from . A stricture was confirmed proximal to previously placed SEMS and was treated with  An 8 cm x 63mm diameter uncovered SEMS  07/20/18 ERCP with plastic stent removal. A 5-8 mm stricture was seen proximal to SEMS There was a 5-89mm stricture just proximal to the existing SEMS which was ? Involving LHD. Two plastic stents placed bridging the stricture and extending into the duodenum within the SEMS  06/25/18- ERCP (Dr. Rush Landmark)  for stent change / elevated liver tests and CBD stones. Sludge removed from bile duct. Bile flowing through metal stent but  for precautionary measures a plastic stent placed into metal stent.    Interim history:   ED 11/03/18 Patient presented to ED with fever but no localizing symptoms.  His liver test which had been improving, were elevated.  ED discussed case with patient's oncologist who recommended twice daily Cipro and follow-up visit on Monday.    ED 11/04/18 -Returned to ED with patient fever, "shivers " and had an episode of vomiting.  Fever 103.   White count up minimally to 12.6.  COVID testing negative.  Urine does not appear infected.  His liver tests have actually improved overnight.  RUQ ultrasound yesterday showed mild gallbladder wall thickening with pericholecystic fluid and sludge within the gallbladder.  Biliary stent in place, no bile duct dilation seen. He has apparently been exhibiting some confusion / speech changes.   MRI. Brain- Numerous foci of chronic microhemorrhage with a predominantperipheral supratentorial distribution favoring sequelae of amyloid angiopathy over chronic hypertension. Repeat Brain MRI w/ contrast today shows no enhancement, no evidence for metastatic disease. He is for TEE today. He denies abdominal pain.     Past Medical History:  Diagnosis Date   Abdominal pain    due to bloating   Bloating    Cancer (HCC)    skin cancer   Essential hypertension    Fatigue    History of kidney stones    passed   History of weight loss    HOH (hard of hearing)    no hearing aids   Neuralgia    Pancreatic cancer ([place])    Stress  loss of father in Dec 2018.    Past Surgical History:  Procedure Laterality Date   APPENDECTOMY     BILIARY STENT PLACEMENT N/A 06/25/2018   Procedure: BILIARY STENT PLACEMENT;  Surgeon: Irving Copas., MD;  Location: [facility] ENDOSCOPY;  Service: Gastroenterology;  Laterality: N/A;   BILIARY STENT PLACEMENT  07/20/2018   Procedure: BILIARY STENT PLACEMENT;  Surgeon: Milus Banister, MD;  Location: [facility] ENDOSCOPY;  Service:  Gastroenterology;;   BILIARY STENT PLACEMENT N/A 08/16/2018   Procedure: BILIARY STENT PLACEMENT;  Surgeon: Milus Banister, MD;  Location: [facility] ENDOSCOPY;  Service: Endoscopy;  Laterality: N/A;   ENDOSCOPIC RETROGRADE CHOLANGIOPANCREATOGRAPHY (ERCP) WITH PROPOFOL N/A 08/10/2017   Procedure: ENDOSCOPIC RETROGRADE CHOLANGIOPANCREATOGRAPHY (ERCP) WITH PROPOFOL;  Surgeon: Milus Banister, MD;  Location: [facility] ENDOSCOPY;  Service: Endoscopy;  Laterality: N/A;   ENDOSCOPIC RETROGRADE CHOLANGIOPANCREATOGRAPHY (ERCP) WITH PROPOFOL N/A 07/20/2018   Procedure: ENDOSCOPIC RETROGRADE CHOLANGIOPANCREATOGRAPHY (ERCP) WITH PROPOFOL;  Surgeon: Milus Banister, MD;  Location: [facility] ENDOSCOPY;  Service: Gastroenterology;  Laterality: N/A;  Balloon Sweeps   ENDOSCOPIC RETROGRADE CHOLANGIOPANCREATOGRAPHY (ERCP) WITH PROPOFOL N/A 08/16/2018   Procedure: ENDOSCOPIC RETROGRADE CHOLANGIOPANCREATOGRAPHY (ERCP) WITH PROPOFOL;  Surgeon: Milus Banister, MD;  Location: [facility] ENDOSCOPY;  Service: Endoscopy;  Laterality: N/A;   ERCP N/A 06/25/2018   Procedure: ENDOSCOPIC RETROGRADE CHOLANGIOPANCREATOGRAPHY (ERCP);  Surgeon: Irving Copas., MD;  Location: [facility];  Service: Gastroenterology;  Laterality: N/A;   EUS N/A 08/10/2017   Procedure: UPPER ENDOSCOPIC ULTRASOUND (EUS) RADIAL;  Surgeon: Milus Banister, MD;  Location: [facility] ENDOSCOPY;  Service: Endoscopy;  Laterality: N/A;   EYE SURGERY     lasik/left eye   IR FLUORO GUIDE PORT INSERTION RIGHT  08/30/2017   IR US GUIDE VASC ACCESS RIGHT  08/30/2017   REMOVAL OF STONES  06/25/2018   Procedure: REMOVAL OF STONES;  Surgeon: Irving Copas., MD;  Location: [facility] ENDOSCOPY;  Service: Gastroenterology;;   SKIN CANCER EXCISION     STENT REMOVAL  07/20/2018   Procedure: STENT REMOVAL;  Surgeon: Milus Banister, MD;  [facility] ENDOSCOPY;  Service: Gastroenterology;;   Lavell Islam REMOVAL  08/16/2018   Procedure: STENT REMOVAL;  Surgeon: Milus Banister, MD;   Location: [facility] ENDOSCOPY;  Service: Endoscopy;;    Prior to Admission medications   Medication Sig Start Date End Date Taking? Authorizing Provider  acetaminophen (TYLENOL) 500 MG tablet Take 500-1,000 mg by mouth every 6 (six) hours as needed for moderate pain.   Yes [provider]  amLODipine (NORVASC) 10 MG tablet Take 10 mg by mouth at bedtime.  05/21/18  Yes [provider]  ciprofloxacin (CIPRO) 500 MG tablet Take 1 tablet (500 mg total) by mouth 2 (two) times daily. One po bid x 7 days Patient taking differently: Take 500 mg by mouth 2 (two) times daily.  11/04/18  Yes Molpus, John, MD  gabapentin (NEURONTIN) 100 MG capsule Take 2 capsules (200 mg total) by mouth 2 (two) times daily. 10/01/18  Yes Ladell Pier, MD  lidocaine-prilocaine (EMLA) cream Apply 1 application topically as needed. Apply to portacath site 1-2 hours prior to use Patient taking differently: Apply 1 application topically daily as needed (1-2 hours prior to portacath access).  08/20/18  Yes Owens Shark, NP  loperamide (IMODIUM A-D) 2 MG tablet Take 1 tablet (2 mg total) by mouth as directed. Patient taking differently: Take 2-4 mg by mouth 3 (three) times daily as needed for diarrhea or loose stools.  08/22/18  Yes Betsy Coder  B, MD  olmesartan (BENICAR) 40 MG tablet Take 1 tablet (40 mg total) by mouth daily. 04/04/18  Yes Weber, Sarah L, PA-C  ondansetron (ZOFRAN) 4 MG tablet Take 1 tablet (4 mg total) by mouth every 6 (six) hours as needed for nausea. 06/25/18  Yes Emokpae, Courage, MD  oxyCODONE-acetaminophen (PERCOCET) 10-325 MG tablet Take 1 tablet by mouth every 4 (four) hours as needed for pain. 10/31/18  Yes Ladell Pier, MD  potassium chloride SA (K-DUR,KLOR-CON) 20 MEQ tablet Take 1 tablet (20 mEq total) by mouth 2 (two) times daily. 10/16/18  Yes Ladell Pier, MD  [facility] ACETATE EX Apply 1 application topically daily as needed (itching).   Yes [provider]    ciprofloxacin (CIPRO) 500 MG tablet Take 1 tablet (500 mg total) by mouth 2 (two) times daily. Patient not taking: Reported on 11/04/2018 11/03/18   Lacretia Leigh, MD    Current Facility-Administered Medications  Medication Dose Route Frequency Provider Last Rate Last Dose   acetaminophen (TYLENOL) tablet 650 mg  650 mg Oral Q6H PRN Katherine Roan, MD       Or   acetaminophen (TYLENOL) suppository 650 mg  650 mg Rectal Q6H PRN Katherine Roan, MD       enoxaparin (LOVENOX) injection 40 mg  40 mg Subcutaneous Q24H Katherine Roan, MD   40 mg at 11/04/18 2215   loperamide (IMODIUM) capsule 2 mg  2 mg Oral BID PRN Oda Kilts, MD       ondansetron [facility]) tablet 4 mg  4 mg Oral Q6H PRN Katherine Roan, MD       oxyCODONE-acetaminophen (PERCOCET/ROXICET) 5-325 MG per tablet 1 tablet  1 tablet Oral Q4H PRN Oda Kilts, MD   1 tablet at 11/05/18 4259   And   oxyCODONE (Oxy IR/ROXICODONE) immediate release tablet 5 mg  5 mg Oral Q4H PRN Oda Kilts, MD       piperacillin-tazobactam (ZOSYN) IVPB 3.375 g  3.375 g Intravenous Once Laren Everts, RPH 100 mL/hr at 11/05/18 1044 3.375 g at 11/05/18 1044   piperacillin-tazobactam (ZOSYN) IVPB 3.375 g  3.375 g Intravenous Q8H Bryk, Veronda P, RPH       sodium chloride flush (NS) 0.9 % injection 3 mL  3 mL Intravenous Once Sherwood Gambler, MD        Allergies as of 11/04/2018   (No Known Allergies)    Family History  Problem Relation Age of Onset   Hyperlipidemia Father    Heart disease Father    Dementia Father    Memory loss Mother    Colon cancer Neg Hx    Esophageal cancer Neg Hx    Inflammatory bowel disease Neg Hx    Liver disease Neg Hx    Rectal cancer Neg Hx    Stomach cancer Neg Hx    Pancreatic cancer Neg Hx     Social History   Socioeconomic History   Marital status: Married    Spouse name: Not on file   Number of children: Not on file   Years of education: Not  on file   Highest education level: Not on file  Occupational History    Employer: [employer] resource strain: Not on file   Food insecurity:    Worry: Not on file    Inability: Not on file   Transportation needs:    Medical: Not on file    Non-medical: Not  on file  Tobacco Use   Smoking status: Never Smoker   Smokeless tobacco: Never Used  Substance and Sexual Activity   Alcohol use: No   Drug use: No   Sexual activity: Yes    Partners: Female    Birth control/protection: None  Lifestyle   Physical activity:    Days per week: Not on file    Minutes per session: Not on file   Stress: Not on file  Relationships   Social connections:    Talks on phone: Not on file    Gets together: Not on file    Attends religious service: Not on file    Active member of club or organization: Not on file    Attends meetings of clubs or organizations: Not on file    Relationship status: Not on file   Intimate partner violence:    Fear of current or ex partner: Not on file    Emotionally abused: Not on file    Physically abused: Not on file    Forced sexual activity: Not on file  Other Topics Concern   Not on file  Social History Narrative   Married   2 children, 2 grand children   Coaches womens basketball at [employer]: All systems reviewed and negative except where noted in HPI.  Physical Exam: Vital signs in last 24 hours: Temp:  [97.7 F (36.5 C)-103.1 F (39.5 C)] 98.9 F (37.2 C) (05/04 0723) Pulse Rate:  [70-98] 74 (05/04 0723) Resp:  [14-20] 18 (05/04 0723) BP: (103-138)/(69-87) 127/75 (05/04 0723) SpO2:  [95 %-100 %] 97 % (05/04 0723) Weight:  [94.2 kg] 94.2 kg (05/04 0151) Last BM Date: 11/05/18 General:   Alert, well-developed,  male in NAD Psych:  Pleasant, cooperative. Normal mood and affect. Eyes:  Pupils equal, sclera clear, no icterus.   Conjunctiva pink. Ears:  Normal auditory  acuity. Nose:  No deformity, discharge,  or lesions. Neck:  Supple; no masses Lungs:  Clear throughout to auscultation.   No wheezes, crackles, or rhonchi.  Heart:  Regular rate and rhythm; no murmurs, no lower extremity edema Abdomen:  Soft, non-distended, nontender, BS active, no palp mass    Rectal:  Deferred  Msk:  Symmetrical without gross deformities. . Neurologic:  Alert and  oriented x4;  Speech is a little slow.  Skin:  Intact without significant lesions or rashes.   Intake/Output from previous day: 05/03 0701 - 05/04 0700 In: 240 [P.O.:240] Out: 6 [Urine:3; Stool:3] Intake/Output this shift: No intake/output data recorded.  Lab Results: Recent Labs    11/03/18 2119 11/04/18 1545 11/05/18 0432  WBC 10.5 12.6* 6.6  HGB 12.1* 10.0* 8.8*  HCT 38.5* 31.3* 27.4*  PLT 117* PLATELET CLUMPS NOTED ON SMEAR, COUNT APPEARS DECREASED PLATELET CLUMPS NOTED ON SMEAR, COUNT APPEARS DECREASED   BMET Recent Labs    11/03/18 2119 11/04/18 1545 11/04/18 1550 11/05/18 0432  NA 136 134*  --  134*  K 3.5 4.0  --  3.7  CL 99 101  --  103  CO2 24 20*  --  21*  GLUCOSE 87 109*  --  111*  BUN 7* 12  --  12  CREATININE 0.89 1.02 0.90 0.86  CALCIUM 8.6* 8.0*  --  7.8*   LFT Recent Labs    11/05/18 0432  PROT 4.7*  ALBUMIN 2.2*  AST 75*  ALT 87*  ALKPHOS 450*  BILITOT 1.2   PT/INR Recent Labs  11/04/18 1545  LABPROT 17.3*  INR 1.4*   Hepatitis Panel No results for input(s): HEPBSAG, HCVAB, HEPAIGM, HEPBIGM in the last 72 hours.    Studies/Results: Ct Angio Head W Or Wo Contrast  Result Date: 11/04/2018 CLINICAL DATA:  Aphasia.  Decreased responsiveness. EXAM: CT ANGIOGRAPHY HEAD AND NECK TECHNIQUE: Multidetector CT imaging of the head and neck was performed using the standard protocol during bolus administration of intravenous contrast. Multiplanar CT image reconstructions and MIPs were obtained to evaluate the vascular anatomy. Carotid stenosis measurements (when  applicable) are obtained utilizing NASCET criteria, using the distal internal carotid diameter as the denominator. CONTRAST:  47mL OMNIPAQUE IOHEXOL 350 MG/ML SOLN COMPARISON:  None. FINDINGS: CTA NECK FINDINGS Aortic arch: Standard 3 vessel aortic arch with mild atherosclerotic plaque. Widely patent arch vessel origins. Right carotid system: Patent with mild plaque about the carotid bifurcation. No evidence of significant stenosis or dissection. Left carotid system: Patent with mild plaque about the carotid bifurcation. No evidence of significant stenosis or dissection. Vertebral arteries: Patent without evidence of significant stenosis or dissection. Nonstenotic calcified plaque at the right vertebral artery origin. Mildly dominant left vertebral artery. Skeleton: Mild cervical spondylosis. Other neck: No neck mass or enlarged lymph nodes identified. Upper chest: Clear lung apices. Review of the MIP images confirms the above findings CTA HEAD FINDINGS Anterior circulation: The internal carotid arteries are patent from skull base to carotid termini with mild nonstenotic plaque bilaterally. ACAs and MCAs are patent with branch vessel irregularity but no evidence of proximal branch occlusion or significant proximal stenosis. No aneurysm is identified. Posterior circulation: The intracranial vertebral arteries are patent to the basilar. Patent left PICA and bilateral SCAs are visualized. AICAs and a right PICA are not clearly identified. The basilar artery is widely patent. Posterior communicating arteries are not clearly identified and may be diminutive or absent. PCAs are patent without evidence of significant proximal stenosis. No aneurysm is identified. Venous sinuses: Patent. Anatomic variants: None. Review of the MIP images confirms the above findings IMPRESSION: 1. No arch vessel occlusion. 2. Mild atherosclerosis in the head and neck without significant stenosis. 3.  Aortic Atherosclerosis (ICD10-I70.0). These  results were communicated to Dr. Lorraine Lax at 4:19 pm on 11/04/2018 by text page via the [facility] messaging system. Electronically Signed   By: Logan Bores M.D.   On: 11/04/2018 16:33   Ct Angio Neck W Or Wo Contrast  Result Date: 11/04/2018 CLINICAL DATA:  Aphasia.  Decreased responsiveness. EXAM: CT ANGIOGRAPHY HEAD AND NECK TECHNIQUE: Multidetector CT imaging of the head and neck was performed using the standard protocol during bolus administration of intravenous contrast. Multiplanar CT image reconstructions and MIPs were obtained to evaluate the vascular anatomy. Carotid stenosis measurements (when applicable) are obtained utilizing NASCET criteria, using the distal internal carotid diameter as the denominator. CONTRAST:  81mL OMNIPAQUE IOHEXOL 350 MG/ML SOLN COMPARISON:  None. FINDINGS: CTA NECK FINDINGS Aortic arch: Standard 3 vessel aortic arch with mild atherosclerotic plaque. Widely patent arch vessel origins. Right carotid system: Patent with mild plaque about the carotid bifurcation. No evidence of significant stenosis or dissection. Left carotid system: Patent with mild plaque about the carotid bifurcation. No evidence of significant stenosis or dissection. Vertebral arteries: Patent without evidence of significant stenosis or dissection. Nonstenotic calcified plaque at the right vertebral artery origin. Mildly dominant left vertebral artery. Skeleton: Mild cervical spondylosis. Other neck: No neck mass or enlarged lymph nodes identified. Upper chest: Clear lung apices. Review of the MIP images confirms the  above findings CTA HEAD FINDINGS Anterior circulation: The internal carotid arteries are patent from skull base to carotid termini with mild nonstenotic plaque bilaterally. ACAs and MCAs are patent with branch vessel irregularity but no evidence of proximal branch occlusion or significant proximal stenosis. No aneurysm is identified. Posterior circulation: The intracranial vertebral arteries are patent to  the basilar. Patent left PICA and bilateral SCAs are visualized. AICAs and a right PICA are not clearly identified. The basilar artery is widely patent. Posterior communicating arteries are not clearly identified and may be diminutive or absent. PCAs are patent without evidence of significant proximal stenosis. No aneurysm is identified. Venous sinuses: Patent. Anatomic variants: None. Review of the MIP images confirms the above findings IMPRESSION: 1. No arch vessel occlusion. 2. Mild atherosclerosis in the head and neck without significant stenosis. 3.  Aortic Atherosclerosis (ICD10-I70.0). These results were communicated to Dr. Lorraine Lax at 4:19 pm on 11/04/2018 by text page via the [facility] messaging system. Electronically Signed   By: Logan Bores M.D.   On: 11/04/2018 16:33   Mr Brain Wo Contrast  Result Date: 11/05/2018 CLINICAL DATA:  63 y/o  M; episode of confusion and aphasia. EXAM: MRI HEAD WITHOUT CONTRAST TECHNIQUE: Multiplanar, multiecho pulse sequences of the brain and surrounding structures were obtained without intravenous contrast. COMPARISON:  11/04/2018 CT head and CTA head. FINDINGS: Brain: No acute infarction, hemorrhage, hydrocephalus, extra-axial collection or mass lesion. Punctate and early confluent nonspecific T2 FLAIR hyperintensities in subcortical and periventricular white matter are compatible with mild to moderate chronic microvascular ischemic changes. Mild volume loss of the brain. Numerous punctate and small foci of susceptibility hypointensity are present throughout the brain in a predominantly peripheral distribution compatible with chronic microhemorrhage. Vascular: Normal flow voids. Skull and upper cervical spine: Normal marrow signal. Sinuses/Orbits: Negative. Other: None. IMPRESSION: 1. No acute intracranial abnormality identified. 2. Mild-to-moderate chronic microvascular ischemic changes and mild volume loss of the brain. 3. Numerous foci of chronic microhemorrhage with a  predominant peripheral supratentorial distribution favoring sequelae of amyloid angiopathy over chronic hypertension. Electronically Signed   By: Kristine Garbe M.D.   On: 11/05/2018 01:39   Dg Chest Portable 1 View  Result Date: 11/04/2018 CLINICAL DATA:  Fever, on chemotherapy EXAM: PORTABLE CHEST 1 VIEW COMPARISON:  11/03/2018 FINDINGS: There is a right-sided Port-A-Cath in satisfactory position. There is no focal parenchymal opacity. There is no pleural effusion or pneumothorax. The heart and mediastinal contours are unremarkable. The osseous structures are unremarkable. IMPRESSION: No active disease. Electronically Signed   By: Kathreen Devoid   On: 11/04/2018 17:03   Dg Chest Port 1 View  Result Date: 11/03/2018 CLINICAL DATA:  Fever.  History of pancreatic cancer. EXAM: PORTABLE CHEST 1 VIEW COMPARISON:  June 22, 2018 FINDINGS: The right Port-A-Cath is stable. The heart, hila, mediastinum, lungs, and pleura are normal. IMPRESSION: No active disease. Electronically Signed   By: Dorise Bullion III M.D   On: 11/03/2018 21:25   Ct Head Code Stroke Wo Contrast  Result Date: 11/04/2018 CLINICAL DATA:  Code stroke.  Aphasia.  Decreased responsiveness. EXAM: CT HEAD WITHOUT CONTRAST TECHNIQUE: Contiguous axial images were obtained from the base of the skull through the vertex without intravenous contrast. COMPARISON:  None. FINDINGS: Brain: There is an approximately 1 cm focus of hypoattenuation involving cortex and subcortical white matter in the right superior frontal gyrus suggestive of an infarct of indeterminate acuity. Patchy hypodensities are present elsewhere in the cerebral white matter bilaterally, nonspecific but compatible with mild  chronic small vessel ischemic disease. No intracranial hemorrhage, mass, midline shift, or extra-axial fluid collection is identified. The ventricles and sulci are normal for age. Vascular: Calcified atherosclerosis at the skull base. No hyperdense vessel.  Skull: No fracture or focal osseous lesion. Sinuses/Orbits: Air cell opacification at the right mastoid tip. Clear paranasal sinuses. Unremarkable orbits. Other: None. ASPECTS [facility] Stroke Program Early CT Score) 9 or 10 for the right MCA territory, 10 for the left MCA territory IMPRESSION: 1. Small age indeterminate infarct in the right superior frontal gyrus. 2. No intracranial hemorrhage. 3. Mild chronic small vessel ischemic disease. These results were communicated to Dr. Lorraine Lax at 4:19 pm on 11/04/2018 by text page via the [facility] messaging system. Electronically Signed   By: Logan Bores M.D.   On: 11/04/2018 16:20   US Abdomen Limited Ruq  Result Date: 11/04/2018 CLINICAL DATA:  Fever. History of metastatic pancreatic cancer. Biliary stent placed 2019 EXAM: ULTRASOUND ABDOMEN LIMITED RIGHT UPPER QUADRANT COMPARISON:  10/30/2018 CT FINDINGS: Gallbladder: No visible stones. There is mild gallbladder wall thickening at 4 mm. Surrounding pericholecystic fluid. Sludge noted within the gallbladder. Common bile duct: Diameter: Biliary stent in place which obscures the mid to distal common bile duct. Proximally, the common bile duct measures 5 mm. Liver: Heterogeneous echotexture throughout the liver. Increased echotexture throughout much of the liver, particularly near the gallbladder fossa. This likely reflects focal fatty infiltration. No visible well-defined focal mass lesion. Portal vein is patent on color Doppler imaging with normal direction of blood flow towards the liver. Portal vein is prominent measuring up to 2.4 cm. IMPRESSION: Mild gallbladder wall thickening with pericholecystic fluid and sludge within the gallbladder. No tenderness over the gallbladder during the study or stones visualized. Heterogeneous echotexture throughout the liver which may reflect focal/geographic fatty infiltration. Prominent portal vein, 2.4 cm. Portal vein is patent with blood flow toward the liver. Electronically Signed    By: Rolm Baptise M.D.   On: 11/04/2018 19:26     Tye Savoy, NP-C @  11/05/2018, 10:50 AM

## 2018-11-05 NOTE — Procedures (Signed)
ELECTROENCEPHALOGRAM REPORT   Patient: Cory Kelly       Room #: 0R56F EEG No. ID: [id number] Age: 63 y.o.        Sex: male Referring Physician: Rebeca Alert Report Date:  11/05/2018        Interpreting Physician: Alexis Goodell  History: Garv Kuechle is an 63 y.o. male with periods of confusion  Medications:  Zosyn  Conditions of Recording:  This is a 21 channel routine scalp EEG performed with bipolar and monopolar montages arranged in accordance to the international 10/20 system of electrode placement. One channel was dedicated to EKG recording.  The patient is in the awake state.  Description:  The waking background activity consists of a low voltage, symmetrical, fairly well organized, 6 Hz theta activity, seen from the parieto-occipital and posterior temporal regions.  Although mixed with higher frequencies at times this theta activity is the dominant rhythm in the central and temporal regions as well.  This activity is continuous. The patient does not drowse or sleep. No epileptiform activity is noted.  Hyperventilation was not performed.  Intermittent photic stimulation was performed but failed to illicit any change in the tracing.   IMPRESSION: This is an abnormal EEG secondary to general background slowing.  This finding may be seen with a diffuse disturbance that is etiologically nonspecific, but may include a metabolic encephalopathy, among other possibilities.  No epileptiform activity was noted.     Alexis Goodell, MD Neurology [phone] 11/05/2018, 7:29 PM

## 2018-11-05 NOTE — Progress Notes (Signed)
EEG Complete  Results Pending 

## 2018-11-05 NOTE — Progress Notes (Signed)
Internal medicine made aware of blood culture results.

## 2018-11-05 NOTE — Progress Notes (Signed)
Spoke with Charlean Sanfilippo, pt is hospitalized at [facility]. Pt had OP appt for MRI Liver at [facility] this am. Pt can be RS at [facility] whenever pt is discharged from hospital. Please call Centralized Scheduling or [facility], [phone] to get him back on the schedule.  Thank you! [email or web address]

## 2018-11-05 NOTE — Telephone Encounter (Signed)
Wife called to report he was admitted to [facility] to R/O infection or stroke. Having brain MRI today. Asking if his liver MRI could be done while in the hospital? Per Dr. Benay Spice: He will see patient in am and decide upon timing of MRI.

## 2018-11-05 NOTE — Progress Notes (Addendum)
Subjective: No complaints.   Objective: Current vital signs: BP 127/75 (BP Location: Left Arm)   Pulse 74   Temp 98.9 F (37.2 C) (Oral)   Resp 18   Ht 6\' 6"  (1.981 m)   Wt 94.2 kg   SpO2 97%   BMI 24.00 kg/m  Vital signs in last 24 hours: Temp:  [97.7 F (36.5 C)-103.1 F (39.5 C)] 98.9 F (37.2 C) (05/04 0723) Pulse Rate:  [70-98] 74 (05/04 0723) Resp:  [14-20] 18 (05/04 0723) BP: (103-138)/(69-87) 127/75 (05/04 0723) SpO2:  [95 %-100 %] 97 % (05/04 0723) Weight:  [94.2 kg] 94.2 kg (05/04 0151)  Intake/Output from previous day: 05/03 0701 - 05/04 0700 In: 240 [P.O.:240] Out: 6 [Urine:3; Stool:3] Intake/Output this shift: No intake/output data recorded. Nutritional status:  Diet Order            Diet Heart Room service appropriate? Yes; Fluid consistency: Thin  Diet effective now             HEENT: [place]/AT Skin: Chronic petechiae to dorsal aspects of hands bilaterally  Neurologic Exam: Ment: Alert and oriented. Speech fluent with intact comprehension. Pleasant and cooperative.  CN: EOMI. Face symmetric.  Motor: 5/5 x 4 Sensory: Paresthesias to hands and feet bilaterally Reflexes: 2+ bilateral biceps, brachioradialis and patellae Cerebellar: No ataxia with FNF bilaterally  Lab Results: Results for orders placed or performed during the hospital encounter of 11/04/18 (from the past 48 hour(s))  Protime-INR     Status: Abnormal   Collection Time: 11/04/18  3:45 PM  Result Value Ref Range   Prothrombin Time 17.3 (H) 11.4 - 15.2 seconds   INR 1.4 (H) 0.8 - 1.2    Comment: (NOTE) INR goal varies based on device and disease states. Performed at [facility], [place] [address] [place], [place] [address]   APTT     Status: None   Collection Time: 11/04/18  3:45 PM  Result Value Ref Range   aPTT 33 24 - 36 seconds    Comment: Performed at [facility] [address], [place], [place] [address]  CBC     Status: Abnormal   Collection Time: 11/04/18   3:45 PM  Result Value Ref Range   WBC 12.6 (H) 4.0 - 10.5 K/uL    Comment: WHITE COUNT CONFIRMED ON SMEAR   RBC 3.57 (L) 4.22 - 5.81 MIL/uL   Hemoglobin 10.0 (L) 13.0 - 17.0 g/dL   HCT 31.3 (L) 39.0 - 52.0 %   MCV 87.7 80.0 - 100.0 fL   MCH 28.0 26.0 - 34.0 pg   MCHC 31.9 30.0 - 36.0 g/dL   RDW 15.0 11.5 - 15.5 %   Platelets  150 - 400 K/uL    PLATELET CLUMPS NOTED ON SMEAR, COUNT APPEARS DECREASED   nRBC 0.0 0.0 - 0.2 %    Comment: Performed at [facility], [place] [address], [place], [place] [address]  Differential     Status: Abnormal   Collection Time: 11/04/18  3:45 PM  Result Value Ref Range   Neutrophils Relative % 95 %   Neutro Abs 12.1 (H) 1.7 - 7.7 K/uL   Lymphocytes Relative 1 %   Lymphs Abs 0.1 (L) 0.7 - 4.0 K/uL   Monocytes Relative 3 %   Monocytes Absolute 0.4 0.1 - 1.0 K/uL   Eosinophils Relative 0 %   Eosinophils Absolute 0.0 0.0 - 0.5 K/uL   Basophils Relative 0 %   Basophils Absolute  0.0 0.0 - 0.1 K/uL   WBC Morphology DOHLE BODIES     Comment: TOXIC GRANULATION   Immature Granulocytes 1 %   Abs Immature Granulocytes 0.08 (H) 0.00 - 0.07 K/uL    Comment: Performed at [facility], [place] [address], [place], [place] [address]  Comprehensive metabolic panel     Status: Abnormal   Collection Time: 11/04/18  3:45 PM  Result Value Ref Range   Sodium 134 (L) 135 - 145 mmol/L   Potassium 4.0 3.5 - 5.1 mmol/L   Chloride 101 98 - 111 mmol/L   CO2 20 (L) 22 - 32 mmol/L   Glucose, Bld 109 (H) 70 - 99 mg/dL   BUN 12 8 - 23 mg/dL   Creatinine, Ser 1.02 0.61 - 1.24 mg/dL   Calcium 8.0 (L) 8.9 - 10.3 mg/dL   Total Protein 5.2 (L) 6.5 - 8.1 g/dL   Albumin 2.5 (L) 3.5 - 5.0 g/dL   AST 124 (H) 15 - 41 U/L   ALT 109 (H) 0 - 44 U/L   Alkaline Phosphatase 507 (H) 38 - 126 U/L   Total Bilirubin 1.7 (H) 0.3 - 1.2 mg/dL   GFR calc non Af Amer >60 >60 mL/min   GFR calc Af Amer >60 >60 mL/min   Anion gap 13 5 - 15    Comment: Performed at [facility],  [place] [address], [place], [place] [address]  I-stat Creatinine, ED     Status: None   Collection Time: 11/04/18  3:50 PM  Result Value Ref Range   Creatinine, Ser 0.90 0.61 - 1.24 mg/dL  Urinalysis, Routine w reflex microscopic     Status: Abnormal   Collection Time: 11/04/18  4:13 PM  Result Value Ref Range   Color, Urine YELLOW YELLOW   APPearance CLEAR CLEAR   Specific Gravity, Urine 1.044 (H) 1.005 - 1.030   pH 6.0 5.0 - 8.0   Glucose, UA NEGATIVE NEGATIVE mg/dL   Hgb urine dipstick SMALL (A) NEGATIVE   Bilirubin Urine NEGATIVE NEGATIVE   Ketones, ur 5 (A) NEGATIVE mg/dL   Protein, ur NEGATIVE NEGATIVE mg/dL   Nitrite NEGATIVE NEGATIVE   Leukocytes,Ua NEGATIVE NEGATIVE   RBC / HPF 0-5 0 - 5 RBC/hpf   WBC, UA 0-5 0 - 5 WBC/hpf   Bacteria, UA NONE SEEN NONE SEEN   Mucus PRESENT     Comment: Performed at [facility], [address] [place], [place] [address]  Lactic acid, plasma     Status: None   Collection Time: 11/04/18  4:45 PM  Result Value Ref Range   Lactic Acid, Venous 1.2 0.5 - 1.9 mmol/L    Comment: Performed at [facility] [address] [place],  [address]  SARS Coronavirus 2 (CEPHEID - Performed in [facility] hospital lab), Hosp Order     Status: None   Collection Time: 11/04/18  4:46 PM  Result Value Ref Range   SARS Coronavirus 2 NEGATIVE NEGATIVE    Comment: (NOTE) If result is NEGATIVE SARS-CoV-2 target nucleic acids are NOT DETECTED. The SARS-CoV-2 RNA is generally detectable in upper and lower  respiratory specimens during the acute phase of infection. The lowest  concentration of SARS-CoV-2 viral copies this assay can detect is 250  copies / mL. A negative result does not preclude SARS-CoV-2 infection  and should not be used as the sole basis for treatment or other  patient management decisions.  A negative result  may occur with  improper specimen collection / handling, submission of specimen other  than nasopharyngeal swab, presence  of viral mutation(s) within the  areas targeted by this assay, and inadequate number of viral copies  (<250 copies / mL). A negative result must be combined with clinical  observations, patient history, and epidemiological information. If result is POSITIVE SARS-CoV-2 target nucleic acids are DETECTED. The SARS-CoV-2 RNA is generally detectable in upper and lower  respiratory specimens dur ing the acute phase of infection.  Positive  results are indicative of active infection with SARS-CoV-2.  Clinical  correlation with patient history and other diagnostic information is  necessary to determine patient infection status.  Positive results do  not rule out bacterial infection or co-infection with other viruses. If result is PRESUMPTIVE POSTIVE SARS-CoV-2 nucleic acids MAY BE PRESENT.   A presumptive positive result was obtained on the submitted specimen  and confirmed on repeat testing.  While 2019 novel coronavirus  (SARS-CoV-2) nucleic acids may be present in the submitted sample  additional confirmatory testing may be necessary for epidemiological  and / or clinical management purposes  to differentiate between  SARS-CoV-2 and other Sarbecovirus currently known to infect humans.  If clinically indicated additional testing with an alternate test  methodology [phone]) is advised. The SARS-CoV-2 RNA is generally  detectable in upper and lower respiratory sp ecimens during the acute  phase of infection. The expected result is Negative. Fact Sheet for Patients:  StrictlyIdeas.no Fact Sheet for Healthcare Providers: BankingDealers.co.za This test is not yet approved or cleared by the [place] FDA and has been authorized for detection and/or diagnosis of SARS-CoV-2 by FDA under an Emergency Use Authorization (EUA).  This EUA will remain in effect (meaning this test can be used) for the duration of the COVID-19 declaration under Section  564(b)(1) of the Act, 21 U.S.C. section 360bbb-3(b)(1), unless the authorization is terminated or revoked sooner. Performed at [facility], [place] [address], [place], [place] [address]   Lactic acid, plasma     Status: None   Collection Time: 11/04/18  8:13 PM  Result Value Ref Range   Lactic Acid, Venous 1.1 0.5 - 1.9 mmol/L    Comment: Performed at [facility] [address], [place], [place] [address]  Ammonia     Status: None   Collection Time: 11/04/18  9:07 PM  Result Value Ref Range   Ammonia 27 9 - 35 umol/L    Comment: Performed at [facility], [place] [address], [place], [place] [address]  CBC     Status: Abnormal   Collection Time: 11/05/18  4:32 AM  Result Value Ref Range   WBC 6.6 4.0 - 10.5 K/uL   RBC 3.19 (L) 4.22 - 5.81 MIL/uL   Hemoglobin 8.8 (L) 13.0 - 17.0 g/dL   HCT 27.4 (L) 39.0 - 52.0 %   MCV 85.9 80.0 - 100.0 fL   MCH 27.6 26.0 - 34.0 pg   MCHC 32.1 30.0 - 36.0 g/dL   RDW 15.1 11.5 - 15.5 %   Platelets  150 - 400 K/uL    PLATELET CLUMPS NOTED ON SMEAR, COUNT APPEARS DECREASED    Comment: Immature Platelet Fraction may be clinically indicated, consider ordering this additional test [id number]    nRBC 0.0 0.0 - 0.2 %    Comment: Performed at [facility], [place] [address] [place], [place] [address]  Comprehensive metabolic panel     Status: Abnormal  Collection Time: 11/05/18  4:32 AM  Result Value Ref Range   Sodium 134 (L) 135 - 145 mmol/L   Potassium 3.7 3.5 - 5.1 mmol/L   Chloride 103 98 - 111 mmol/L   CO2 21 (L) 22 - 32 mmol/L   Glucose, Bld 111 (H) 70 - 99 mg/dL   BUN 12 8 - 23 mg/dL   Creatinine, Ser 0.86 0.61 - 1.24 mg/dL   Calcium 7.8 (L) 8.9 - 10.3 mg/dL   Total Protein 4.7 (L) 6.5 - 8.1 g/dL   Albumin 2.2 (L) 3.5 - 5.0 g/dL   AST 75 (H) 15 - 41 U/L   ALT 87 (H) 0 - 44 U/L   Alkaline Phosphatase 450 (H) 38 - 126 U/L   Total Bilirubin 1.2 0.3 - 1.2 mg/dL   GFR calc non Af Amer >60 >60 mL/min   GFR calc Af Amer  >60 >60 mL/min   Anion gap 10 5 - 15    Comment: Performed at [facility], [address] [place], [place] [address]    Recent Results (from the past 240 hour(s))  Blood Culture (routine x 2)     Status: None (Preliminary result)   Collection Time: 11/03/18  9:19 PM  Result Value Ref Range Status   Specimen Description   Final    BLOOD BLOOD RIGHT FOREARM Performed at [facility] [address], [place], [place] [address]    Special Requests   Final    BOTTLES DRAWN AEROBIC AND ANAEROBIC Blood Culture adequate volume Performed at [facility] [address] [place], [place] [address]    Culture   Final    NO GROWTH < 24 HOURS Performed at [facility] [address], [place], [place] [address]    Report Status PENDING  Incomplete  SARS Coronavirus 2 [facility] order, Performed in [facility] hospital lab)     Status: None   Collection Time: 11/03/18  9:19 PM  Result Value Ref Range Status   SARS Coronavirus 2 NEGATIVE NEGATIVE Final    Comment: (NOTE) If result is NEGATIVE SARS-CoV-2 target nucleic acids are NOT DETECTED. The SARS-CoV-2 RNA is generally detectable in upper and lower  respiratory specimens during the acute phase of infection. The lowest  concentration of SARS-CoV-2 viral copies this assay can detect is 250  copies / mL. A negative result does not preclude SARS-CoV-2 infection  and should not be used as the sole basis for treatment or other  patient management decisions.  A negative result may occur with  improper specimen collection / handling, submission of specimen other  than nasopharyngeal swab, presence of viral mutation(s) within the  areas targeted by this assay, and inadequate number of viral copies  (<250 copies / mL). A negative result must be combined with clinical  observations, patient history, and epidemiological information. If result is POSITIVE SARS-CoV-2 target nucleic acids are  DETECTED. The SARS-CoV-2 RNA is generally detectable in upper and lower  respiratory specimens dur ing the acute phase of infection.  Positive  results are indicative of active infection with SARS-CoV-2.  Clinical  correlation with patient history and other diagnostic information is  necessary to determine patient infection status.  Positive results do  not rule out bacterial infection or co-infection with other viruses. If result is PRESUMPTIVE POSTIVE SARS-CoV-2 nucleic acids MAY BE PRESENT.   A presumptive positive result was obtained on the submitted specimen  and confirmed on repeat testing.  While 2019 novel coronavirus  (SARS-CoV-2) nucleic acids may be present in the submitted sample  additional confirmatory testing may be necessary for epidemiological  and / or clinical management purposes  to differentiate between  SARS-CoV-2 and other Sarbecovirus currently known to infect humans.  If clinically indicated additional testing with an alternate test  methodology [phone]) is advised. The SARS-CoV-2 RNA is generally  detectable in upper and lower respiratory sp ecimens during the acute  phase of infection. The expected result is Negative. Fact Sheet for Patients:  StrictlyIdeas.no Fact Sheet for Healthcare Providers: BankingDealers.co.za This test is not yet approved or cleared by the [place] FDA and has been authorized for detection and/or diagnosis of SARS-CoV-2 by FDA under an Emergency Use Authorization (EUA).  This EUA will remain in effect (meaning this test can be used) for the duration of the COVID-19 declaration under Section 564(b)(1) of the Act, 21 U.S.C. section 360bbb-3(b)(1), unless the authorization is terminated or revoked sooner. Performed at [facility], [place] [address], [place], [place] [address]   Blood Culture (routine x 2)     Status: None (Preliminary result)   Collection Time:  11/03/18  9:20 PM  Result Value Ref Range Status   Specimen Description   Final    BLOOD PORTA CATH Performed at [facility] [address], [place], [place] [address]    Special Requests   Final    BOTTLES DRAWN AEROBIC AND ANAEROBIC Blood Culture results may not be optimal due to an excessive volume of blood received in culture bottles Performed at [facility] [address] [place], [place] [address]    Culture   Final    NO GROWTH < 24 HOURS Performed at [facility] [address] [place], [place] [address]    Report Status PENDING  Incomplete  SARS Coronavirus 2 (CEPHEID - Performed in [facility] hospital lab), Hosp Order     Status: None   Collection Time: 11/04/18  4:46 PM  Result Value Ref Range Status   SARS Coronavirus 2 NEGATIVE NEGATIVE Final    Comment: (NOTE) If result is NEGATIVE SARS-CoV-2 target nucleic acids are NOT DETECTED. The SARS-CoV-2 RNA is generally detectable in upper and lower  respiratory specimens during the acute phase of infection. The lowest  concentration of SARS-CoV-2 viral copies this assay can detect is 250  copies / mL. A negative result does not preclude SARS-CoV-2 infection  and should not be used as the sole basis for treatment or other  patient management decisions.  A negative result may occur with  improper specimen collection / handling, submission of specimen other  than nasopharyngeal swab, presence of viral mutation(s) within the  areas targeted by this assay, and inadequate number of viral copies  (<250 copies / mL). A negative result must be combined with clinical  observations, patient history, and epidemiological information. If result is POSITIVE SARS-CoV-2 target nucleic acids are DETECTED. The SARS-CoV-2 RNA is generally detectable in upper and lower  respiratory specimens dur ing the acute phase of infection.  Positive  results are indicative of active infection with  SARS-CoV-2.  Clinical  correlation with patient history and other diagnostic information is  necessary to determine patient infection status.  Positive results do  not rule out bacterial infection or co-infection with other viruses. If result is PRESUMPTIVE POSTIVE SARS-CoV-2 nucleic acids MAY BE PRESENT.   A presumptive positive result was obtained on the submitted specimen  and  confirmed on repeat testing.  While 2019 novel coronavirus  (SARS-CoV-2) nucleic acids may be present in the submitted sample  additional confirmatory testing may be necessary for epidemiological  and / or clinical management purposes  to differentiate between  SARS-CoV-2 and other Sarbecovirus currently known to infect humans.  If clinically indicated additional testing with an alternate test  methodology [phone]) is advised. The SARS-CoV-2 RNA is generally  detectable in upper and lower respiratory sp ecimens during the acute  phase of infection. The expected result is Negative. Fact Sheet for Patients:  StrictlyIdeas.no Fact Sheet for Healthcare Providers: BankingDealers.co.za This test is not yet approved or cleared by the [place] FDA and has been authorized for detection and/or diagnosis of SARS-CoV-2 by FDA under an Emergency Use Authorization (EUA).  This EUA will remain in effect (meaning this test can be used) for the duration of the COVID-19 declaration under Section 564(b)(1) of the Act, 21 U.S.C. section 360bbb-3(b)(1), unless the authorization is terminated or revoked sooner. Performed at [facility], [place] [address], [place], [place] [address]     Lipid Panel No results for input(s): CHOL, TRIG, HDL, CHOLHDL, VLDL, LDLCALC in the last 72 hours.  Studies/Results: Ct Angio Head W Or Wo Contrast  Result Date: 11/04/2018 CLINICAL DATA:  Aphasia.  Decreased responsiveness. EXAM: CT ANGIOGRAPHY HEAD AND NECK TECHNIQUE: Multidetector CT  imaging of the head and neck was performed using the standard protocol during bolus administration of intravenous contrast. Multiplanar CT image reconstructions and MIPs were obtained to evaluate the vascular anatomy. Carotid stenosis measurements (when applicable) are obtained utilizing NASCET criteria, using the distal internal carotid diameter as the denominator. CONTRAST:  9mL OMNIPAQUE IOHEXOL 350 MG/ML SOLN COMPARISON:  None. FINDINGS: CTA NECK FINDINGS Aortic arch: Standard 3 vessel aortic arch with mild atherosclerotic plaque. Widely patent arch vessel origins. Right carotid system: Patent with mild plaque about the carotid bifurcation. No evidence of significant stenosis or dissection. Left carotid system: Patent with mild plaque about the carotid bifurcation. No evidence of significant stenosis or dissection. Vertebral arteries: Patent without evidence of significant stenosis or dissection. Nonstenotic calcified plaque at the right vertebral artery origin. Mildly dominant left vertebral artery. Skeleton: Mild cervical spondylosis. Other neck: No neck mass or enlarged lymph nodes identified. Upper chest: Clear lung apices. Review of the MIP images confirms the above findings CTA HEAD FINDINGS Anterior circulation: The internal carotid arteries are patent from skull base to carotid termini with mild nonstenotic plaque bilaterally. ACAs and MCAs are patent with branch vessel irregularity but no evidence of proximal branch occlusion or significant proximal stenosis. No aneurysm is identified. Posterior circulation: The intracranial vertebral arteries are patent to the basilar. Patent left PICA and bilateral SCAs are visualized. AICAs and a right PICA are not clearly identified. The basilar artery is widely patent. Posterior communicating arteries are not clearly identified and may be diminutive or absent. PCAs are patent without evidence of significant proximal stenosis. No aneurysm is identified. Venous  sinuses: Patent. Anatomic variants: None. Review of the MIP images confirms the above findings IMPRESSION: 1. No arch vessel occlusion. 2. Mild atherosclerosis in the head and neck without significant stenosis. 3.  Aortic Atherosclerosis (ICD10-I70.0). These results were communicated to Dr. Lorraine Lax at 4:19 pm on 11/04/2018 by text page via the [facility] messaging system. Electronically Signed   By: Logan Bores M.D.   On: 11/04/2018 16:33   Ct Angio Neck W Or Wo Contrast  Result Date: 11/04/2018 CLINICAL DATA:  Aphasia.  Decreased responsiveness. EXAM: CT ANGIOGRAPHY HEAD AND NECK TECHNIQUE: Multidetector CT imaging of the head and neck was performed using the standard protocol during bolus administration of intravenous contrast. Multiplanar CT image reconstructions and MIPs were obtained to evaluate the vascular anatomy. Carotid stenosis measurements (when applicable) are obtained utilizing NASCET criteria, using the distal internal carotid diameter as the denominator. CONTRAST:  70mL OMNIPAQUE IOHEXOL 350 MG/ML SOLN COMPARISON:  None. FINDINGS: CTA NECK FINDINGS Aortic arch: Standard 3 vessel aortic arch with mild atherosclerotic plaque. Widely patent arch vessel origins. Right carotid system: Patent with mild plaque about the carotid bifurcation. No evidence of significant stenosis or dissection. Left carotid system: Patent with mild plaque about the carotid bifurcation. No evidence of significant stenosis or dissection. Vertebral arteries: Patent without evidence of significant stenosis or dissection. Nonstenotic calcified plaque at the right vertebral artery origin. Mildly dominant left vertebral artery. Skeleton: Mild cervical spondylosis. Other neck: No neck mass or enlarged lymph nodes identified. Upper chest: Clear lung apices. Review of the MIP images confirms the above findings CTA HEAD FINDINGS Anterior circulation: The internal carotid arteries are patent from skull base to carotid termini with mild  nonstenotic plaque bilaterally. ACAs and MCAs are patent with branch vessel irregularity but no evidence of proximal branch occlusion or significant proximal stenosis. No aneurysm is identified. Posterior circulation: The intracranial vertebral arteries are patent to the basilar. Patent left PICA and bilateral SCAs are visualized. AICAs and a right PICA are not clearly identified. The basilar artery is widely patent. Posterior communicating arteries are not clearly identified and may be diminutive or absent. PCAs are patent without evidence of significant proximal stenosis. No aneurysm is identified. Venous sinuses: Patent. Anatomic variants: None. Review of the MIP images confirms the above findings IMPRESSION: 1. No arch vessel occlusion. 2. Mild atherosclerosis in the head and neck without significant stenosis. 3.  Aortic Atherosclerosis (ICD10-I70.0). These results were communicated to Dr. Lorraine Lax at 4:19 pm on 11/04/2018 by text page via the [facility] messaging system. Electronically Signed   By: Logan Bores M.D.   On: 11/04/2018 16:33   Mr Brain Wo Contrast  Result Date: 11/05/2018 CLINICAL DATA:  63 y/o  M; episode of confusion and aphasia. EXAM: MRI HEAD WITHOUT CONTRAST TECHNIQUE: Multiplanar, multiecho pulse sequences of the brain and surrounding structures were obtained without intravenous contrast. COMPARISON:  11/04/2018 CT head and CTA head. FINDINGS: Brain: No acute infarction, hemorrhage, hydrocephalus, extra-axial collection or mass lesion. Punctate and early confluent nonspecific T2 FLAIR hyperintensities in subcortical and periventricular white matter are compatible with mild to moderate chronic microvascular ischemic changes. Mild volume loss of the brain. Numerous punctate and small foci of susceptibility hypointensity are present throughout the brain in a predominantly peripheral distribution compatible with chronic microhemorrhage. Vascular: Normal flow voids. Skull and upper cervical spine:  Normal marrow signal. Sinuses/Orbits: Negative. Other: None. IMPRESSION: 1. No acute intracranial abnormality identified. 2. Mild-to-moderate chronic microvascular ischemic changes and mild volume loss of the brain. 3. 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 Electronically Signed   By: Kristine Garbe M.D.   On: 11/05/2018 01:39   Dg Chest Portable 1 View  Result Date: 11/04/2018 CLINICAL DATA:  Fever, on chemotherapy EXAM: PORTABLE CHEST 1 VIEW COMPARISON:  11/03/2018 FINDINGS: There is a right-sided Port-A-Cath in satisfactory position. There is no focal parenchymal opacity. There is no pleural effusion or pneumothorax. The heart and mediastinal contours are unremarkable. The osseous  structures are unremarkable. IMPRESSION: No active disease. Electronically Signed   By: Kathreen Devoid   On: 11/04/2018 17:03   Dg Chest Port 1 View  Result Date: 11/03/2018 CLINICAL DATA:  Fever.  History of pancreatic cancer. EXAM: PORTABLE CHEST 1 VIEW COMPARISON:  June 22, 2018 FINDINGS: The right Port-A-Cath is stable. The heart, hila, mediastinum, lungs, and pleura are normal. IMPRESSION: No active disease. Electronically Signed   By: Dorise Bullion III M.D   On: 11/03/2018 21:25   Ct Head Code Stroke Wo Contrast  Result Date: 11/04/2018 CLINICAL DATA:  Code stroke.  Aphasia.  Decreased responsiveness. EXAM: CT HEAD WITHOUT CONTRAST TECHNIQUE: Contiguous axial images were obtained from the base of the skull through the vertex without intravenous contrast. COMPARISON:  None. FINDINGS: Brain: There is an approximately 1 cm focus of hypoattenuation involving cortex and subcortical white matter in the right superior frontal gyrus suggestive of an infarct of indeterminate acuity. Patchy hypodensities are present elsewhere in the cerebral white matter bilaterally, nonspecific but compatible  with mild chronic small vessel ischemic disease. No intracranial hemorrhage, mass, midline shift, or extra-axial fluid collection is identified. The ventricles and sulci are normal for age. Vascular: Calcified atherosclerosis at the skull base. No hyperdense vessel. Skull: No fracture or focal osseous lesion. Sinuses/Orbits: Air cell opacification at the right mastoid tip. Clear paranasal sinuses. Unremarkable orbits. Other: None. ASPECTS [facility] Stroke Program Early CT Score) 9 or 10 for the right MCA territory, 10 for the left MCA territory IMPRESSION: 1. Small age indeterminate infarct in the right superior frontal gyrus. 2. No intracranial hemorrhage. 3. Mild chronic small vessel ischemic disease. These results were communicated to Dr. Lorraine Lax at 4:19 pm on 11/04/2018 by text page via the [facility] messaging system. Electronically Signed   By: Logan Bores M.D.   On: 11/04/2018 16:20   US Abdomen Limited Ruq  Result Date: 11/04/2018 CLINICAL DATA:  Fever. History of metastatic pancreatic cancer. Biliary stent placed 2019 EXAM: ULTRASOUND ABDOMEN LIMITED RIGHT UPPER QUADRANT COMPARISON:  10/30/2018 CT FINDINGS: Gallbladder: No visible stones. There is mild gallbladder wall thickening at 4 mm. Surrounding pericholecystic fluid. Sludge noted within the gallbladder. Common bile duct: Diameter: Biliary stent in place which obscures the mid to distal common bile duct. Proximally, the common bile duct measures 5 mm. Liver: Heterogeneous echotexture throughout the liver. Increased echotexture throughout much of the liver, particularly near the gallbladder fossa. This likely reflects focal fatty infiltration. No visible well-defined focal mass lesion. Portal vein is patent on color Doppler imaging with normal direction of blood flow towards the liver. Portal vein is prominent measuring up to 2.4 cm. IMPRESSION: Mild gallbladder wall thickening with pericholecystic fluid and sludge within the gallbladder. No tenderness over  the gallbladder during the study or stones visualized. Heterogeneous echotexture throughout the liver which may reflect focal/geographic fatty infiltration. Prominent portal vein, 2.4 cm. Portal vein is patent with blood flow toward the liver. Electronically Signed   By: Rolm Baptise M.D.   On: 11/04/2018 19:26    Medications:  Prior to Admission:  Medications Prior to Admission  Medication Sig Dispense Refill Last Dose  . acetaminophen (TYLENOL) 500 MG tablet Take 500-1,000 mg by mouth every 6 (six) hours as needed for moderate pain.   11/03/2018 at pm  . amLODipine (NORVASC) 10 MG tablet Take 10 mg by mouth at bedtime.    11/03/2018 at pm  . ciprofloxacin (CIPRO) 500 MG tablet Take 1 tablet (500 mg total) by mouth 2 (two)  times daily. One po bid x 7 days (Patient taking differently: Take 500 mg by mouth 2 (two) times daily. ) 14 tablet 0 11/04/2018 at noon  . gabapentin (NEURONTIN) 100 MG capsule Take 2 capsules (200 mg total) by mouth 2 (two) times daily. 120 capsule 1 11/04/2018 at am  . lidocaine-prilocaine (EMLA) cream Apply 1 application topically as needed. Apply to portacath site 1-2 hours prior to use (Patient taking differently: Apply 1 application topically daily as needed (1-2 hours prior to portacath access). ) 30 g 2 3 weeks ago  . loperamide (IMODIUM A-D) 2 MG tablet Take 1 tablet (2 mg total) by mouth as directed. (Patient taking differently: Take 2-4 mg by mouth 3 (three) times daily as needed for diarrhea or loose stools. ) 30 tablet 0 11/02/2018  . olmesartan (BENICAR) 40 MG tablet Take 1 tablet (40 mg total) by mouth daily. 90 tablet 0 11/04/2018 at am  . ondansetron (ZOFRAN) 4 MG tablet Take 1 tablet (4 mg total) by mouth every 6 (six) hours as needed for nausea. 20 tablet 0 few months ago  . oxyCODONE-acetaminophen (PERCOCET) 10-325 MG tablet Take 1 tablet by mouth every 4 (four) hours as needed for pain. 75 tablet 0 11/04/2018 at noon  . potassium chloride SA (K-DUR,KLOR-CON) 20 MEQ tablet  Take 1 tablet (20 mEq total) by mouth 2 (two) times daily. 60 tablet 1 11/04/2018 at am  . [facility] ACETATE EX Apply 1 application topically daily as needed (itching).   11/03/2018 at Unknown time  . ciprofloxacin (CIPRO) 500 MG tablet Take 1 tablet (500 mg total) by mouth 2 (two) times daily. (Patient not taking: Reported on 11/04/2018) 14 tablet 0 Not Taking at Unknown time   Scheduled: . enoxaparin (LOVENOX) injection  40 mg Subcutaneous Q24H  . sodium chloride flush  3 mL Intravenous Once     Assessment: 63 year old male on chemotherapy for pancreatic cancer, presenting with dysphasia, confusion and periods of unresponsiveness in the context of fever.  1. Working diagnosis after initial work up is acute metabolic encephalopathy given transaminitis; however, his ammonia is normal. No description of seizure like activity in conjunction with his symptoms, which occurred acutely at home and are now resolved. His presentation would be atypical for TIA. An acute microhemorrhage (see #2 below), if located in the hippocampus or thalamus, could result in acute confusion that rapidly resolves.  A TIA from non-infectious thrombotic endocarditis is possible; of note, this occurs at higher frequency in pancreatic cancer patients per the literature.  2. Although no acute abnormality is seen on MRI, numerous foci of chronic microhemorrhage with a predominant peripheral supratentorial distribution are seen. The DDx for the microhemorrhages, per the Radiology report, includes sequelae of amyloid angiopathy over chronic hypertension. On my review of the images, the lesions are quite extensive and appear mostly cortically based. Given the distribution, chronic sequelae of multiple septic emboli is also on the DDx. Denies a history of trauma, therefore traumatic microbleeds not a significant component of the DDx. Of note, pancreatic cancer, per case reports in the literature, can be associated with cerebral microbleeds.  Familial Cerebral Cavernous Malformation (FCCM) is on the DDx as well, given the appearance and extensive distribution of the microbleeds.  3. The cerebral microbleeds could predispose to seizure. Although his presentation is atypical for complex partial seizure given lack of lateralized motor symptoms,   Recommendations: 1. TTE. If negative, obtain TEE.  2. Repeat MRI brain with contrast (only need these as add-on,  do not need to repeat the entire study).  3. Blood cultures x 2, 4. EEG.  5. If above unrevealing, then will need neurology outpatient follow up to further evaluate possible FCCM with repeat MRI brain in 1 year and genetic testing for the following gene mutations: KRIT1, CCM2, or PDCD10.  40 minutes spent in the evaluation of this medically complex patient. Time spent included MRI review. > 50% of time spent describing MRI results and diagnostic plan to the patient.    LOS: 1 day   @Electronically  signed: Dr. Kerney Elbe 11/05/2018  7:53 AM

## 2018-11-06 ENCOUNTER — Ambulatory Visit: Payer: BLUE CROSS/BLUE SHIELD

## 2018-11-06 ENCOUNTER — Other Ambulatory Visit: Payer: BLUE CROSS/BLUE SHIELD

## 2018-11-06 ENCOUNTER — Inpatient Hospital Stay (HOSPITAL_COMMUNITY): Payer: BLUE CROSS/BLUE SHIELD

## 2018-11-06 ENCOUNTER — Ambulatory Visit: Payer: BLUE CROSS/BLUE SHIELD | Admitting: Oncology

## 2018-11-06 DIAGNOSIS — C250 Malignant neoplasm of head of pancreas: Secondary | ICD-10-CM

## 2018-11-06 DIAGNOSIS — D649 Anemia, unspecified: Secondary | ICD-10-CM

## 2018-11-06 DIAGNOSIS — R7881 Bacteremia: Secondary | ICD-10-CM

## 2018-11-06 DIAGNOSIS — D6959 Other secondary thrombocytopenia: Secondary | ICD-10-CM

## 2018-11-06 DIAGNOSIS — R161 Splenomegaly, not elsewhere classified: Secondary | ICD-10-CM

## 2018-11-06 DIAGNOSIS — K766 Portal hypertension: Secondary | ICD-10-CM

## 2018-11-06 DIAGNOSIS — B954 Other streptococcus as the cause of diseases classified elsewhere: Secondary | ICD-10-CM

## 2018-11-06 DIAGNOSIS — Z95828 Presence of other vascular implants and grafts: Secondary | ICD-10-CM

## 2018-11-06 DIAGNOSIS — Z79899 Other long term (current) drug therapy: Secondary | ICD-10-CM

## 2018-11-06 LAB — COMPREHENSIVE METABOLIC PANEL
ALT: 67 U/L — ABNORMAL HIGH (ref 0–44)
AST: 44 U/L — ABNORMAL HIGH (ref 15–41)
Albumin: 2.3 g/dL — ABNORMAL LOW (ref 3.5–5.0)
Alkaline Phosphatase: 429 U/L — ABNORMAL HIGH (ref 38–126)
Anion gap: 10 (ref 5–15)
BUN: 10 mg/dL (ref 8–23)
CO2: 24 mmol/L (ref 22–32)
Calcium: 8.1 mg/dL — ABNORMAL LOW (ref 8.9–10.3)
Chloride: 101 mmol/L (ref 98–111)
Creatinine, Ser: 0.91 mg/dL (ref 0.61–1.24)
GFR calc Af Amer: >60 mL/min (ref >60)
GFR calc non Af Amer: >60 mL/min (ref >60)
Glucose, Bld: 99 mg/dL (ref 70–99)
Potassium: 3.5 mmol/L (ref 3.5–5.1)
Sodium: 135 mmol/L (ref 135–145)
Total Bilirubin: 1 mg/dL (ref 0.3–1.2)
Total Protein: 4.8 g/dL — ABNORMAL LOW (ref 6.5–8.1)

## 2018-11-06 LAB — CBC
HCT: 28.5 % — ABNORMAL LOW (ref 39.0–52.0)
Hemoglobin: 9.2 g/dL — ABNORMAL LOW (ref 13.0–17.0)
MCH: 27.5 pg (ref 26.0–34.0)
MCHC: 32.3 g/dL (ref 30.0–36.0)
MCV: 85.3 fL (ref 80.0–100.0)
Platelets: 75 10*3/uL — ABNORMAL LOW (ref 150–400)
RBC: 3.34 MIL/uL — ABNORMAL LOW (ref 4.22–5.81)
RDW: 14.9 % (ref 11.5–15.5)
WBC: 4.2 10*3/uL (ref 4.0–10.5)
nRBC: 0 % (ref 0.0–0.2)

## 2018-11-06 MED ORDER — IRBESARTAN 300 MG PO TABS
300.0000 mg | ORAL_TABLET | Freq: Every day | ORAL | Status: DC
  Administered 2018-11-06 – 2018-11-08 (×3): 300 mg via ORAL
  Filled 2018-11-06 (×3): qty 1

## 2018-11-06 MED ORDER — GADOBUTROL 1 MMOL/ML IV SOLN
10.0000 mL | Freq: Once | INTRAVENOUS | Status: AC | PRN
  Administered 2018-11-06: 10 mL via INTRAVENOUS

## 2018-11-06 MED ORDER — SODIUM CHLORIDE 0.9 % IV SOLN
2.0000 g | INTRAVENOUS | Status: DC
  Administered 2018-11-06 – 2018-11-08 (×3): 2 g via INTRAVENOUS
  Filled 2018-11-06 (×3): qty 20

## 2018-11-06 NOTE — Progress Notes (Signed)
   Subjective: No overnight events. Denies nausea, vomiting, and abdominal pain. Reports that he ambulated without issue and would like to stay active and walk around his room. He was visited by Cory Kelly this morning and appreciate the support of the cancer center. He was updated on his bacteremia and need for continued IV abx therapy. Spoke with the patient's wife via telephone to update her as well.  Objective:  Vital signs in last 24 hours: Vitals:   11/05/18 1552 11/05/18 1941 11/05/18 2332 11/06/18 0336  BP: 132/82 130/86 140/89 126/79  Pulse: 61 66 (!) 54 60  Resp: 18 17 17 19   Temp: 98.3 F (36.8 C) 98.1 F (36.7 C) 97.7 F (36.5 C) 98 F (36.7 C)  TempSrc: Oral Oral Oral Oral  SpO2: 99% 99% 99% 97%  Weight:      Height:       Gen: alert and oriented x3, no distress CV: RRR, no murmurs Pulm: CTAB, normal effort on room air Abd: bowel sounds present, soft, non-distended, non-tender Ext: no edema  Assessment/Plan:  Principal Problem:   Fever, unspecified Active Problems:   Elevated LFTs   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HCC)   Port-A-Cath in place   Episode of transient neurologic symptoms  Cory Kelly is a 63 yo man with a medical history of metastatic pancreatic adenocarcinoma s/p indwelling biliary stent and currently undergoing chemotherapy, neuropathy, andHTNwho presented with fever to 103 and AMS.   Fever Encephalopathy Elevated LFTs and hyperbilirubinemia - Currently afebrile, hemodynamically stable, and back to baseline mentation. Leukocytosis resolved. BCID from 5/2 blood cx shows streptococcus. Blood cx from 5/3 are negative to date. Urine cx negative. - Concern for abdominal source of infection given history of biliary stent. GI following LFTs, but no indication for MRCP or ERCP at this time.  - MRI brain without contrast showed no sign of stroke, but did show acute microhemorrhages possibly representing amyloid angiopathy, chronic HTN, multiple  septic emboli, microbleeds from pancreatic cancer, or familial cerebral cavernous malformation. Repeat MRI brain with contrast was normal. EEG showed general background slowing, but no seizure activity.  - TTE: EF 95-18%, diastolic dysfunction, no valvular vegetations Plan - GI following, appreciate recs - Neurology following, appreciate recs  - ID consulted, appreciate recs - Cardiology consulted for TEE - Continue Zosyn - F/u 5/2 and 5/3 blood cx (pt was on cipro when collected) - Tylenol prn for fever  Metastatic pancreatic adenocarcinoma:Follows with Cory Kelly. Last chemo 3 weeks ago. 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 He is not neutropenic.  - Holding home gabapentin 2/2 AMS - Continue home imodium prn  - Continue home zofran prn  - Continue home percocet 10-325mg  for abdominal pain - Liver MRI per Dr. Gearldine Shown request  HTN: BP 126/79 this am. Home regimen includes amlodipine 10mg  daily and olmesartan 40mg  daily.  - Holding home antihypertensives in the setting of active infection  Dispo: Anticipated discharge in approximately 2-3 day(s).   Dorrell, Andree Elk, MD 11/06/2018, 6:21 AM Pager: [phone]

## 2018-11-06 NOTE — Progress Notes (Addendum)
TTE reveals no mural thrombus or vegetation.   MRI add on study with contrast showed no abnormal enhancement. Therefore, it is unlikely that any of the microhemorrhages seen on the initial MRI scan are acute. Septic embolization is now significantly lower on the DDx. DDx still includes familial cavernomas, amyloid angiopathy, chronic hypertensive hemorrhages. The lesions are not consistent with metastases based on overall imaging characteristics, as well as the fact, based on the literature, that pancreatic CA rarely metastasizes to brain.    EEG: This is an abnormal EEG secondary to general background slowing.  This finding may be seen with a diffuse disturbance that is etiologically nonspecific, but may include a metabolic encephalopathy, among other possibilities.  No epileptiform activity was noted.    Will need to obtain TEE. Will defer to primary team for ordering given logistics including NPO after midnight.   Electronically signed: Dr. Kerney Elbe

## 2018-11-06 NOTE — Progress Notes (Signed)
Progress Note    ASSESSMENT AND PLAN:   16. 63 year old male with metastatic pancreatic cancer on chemotherapy admitted with fever / confusion. Infectious workup in progress.  Afebrile today. Normal WBC -antibiotics have been narrowed to Rocephin.  -TTE negative, next step is TEE. -One blood culture so far positive for streptococcus species (not enterococcus), see #2 -MRI negative for acute CVA.  EEG - abnormal with slowing but findings non-specific.  -Ongoing infectious workup. TTE negative, TEE next step  2. Abnormal liver tests. He had a transient rise in liver tests on 11/03/18 (which overall had been downtrending since last ERCP in February). Nothing otherwise to suggest biliary obstruction (no biliary duct dilation on ultrasound, no abdominal pain, and his liver tests have since continued to improve.  -MRI of liver pending per Oncology's request -GI will sign off. Call for questions.     SUBJECTIVE    no complaints other than hungry   OBJECTIVE:     Vital signs in last 24 hours: Temp:  [97.7 F (36.5 C)-98.4 F (36.9 C)] 97.8 F (36.6 C) (05/05 0803) Pulse Rate:  [54-66] 61 (05/05 0803) Resp:  [17-19] 18 (05/05 0803) BP: (125-153)/(79-94) 153/94 (05/05 0803) SpO2:  [97 %-100 %] 100 % (05/05 0803) Last BM Date: 11/05/18 General:   Alert, well-developed male in NAD EENT:  Normal hearing, non icteric sclera, conjunctive pink.  Heart:  Regular rate and rhythm; no murmur.  No lower extremity edema   Pulm: Normal respiratory effort, lungs CTA bilaterally without wheezes or crackles. Abdomen:  Soft, nondistended, nontender.  Normal bowel sounds,.       Neurologic:  Alert and  oriented x4;  grossly normal neurologically. Psych:  Pleasant, cooperative.  Normal mood and affect.   Intake/Output from previous day: 05/04 0701 - 05/05 0700 In: 100 [IV Piggyback:100] Out: 700 [Urine:700] Intake/Output this shift: Total I/O In: 120 [P.O.:120] Out: -   Lab  Results: Recent Labs    11/04/18 1545 11/05/18 0432 11/06/18 0435  WBC 12.6* 6.6 4.2  HGB 10.0* 8.8* 9.2*  HCT 31.3* 27.4* 28.5*  PLT PLATELET CLUMPS NOTED ON SMEAR, COUNT APPEARS DECREASED PLATELET CLUMPS NOTED ON SMEAR, COUNT APPEARS DECREASED 75*   BMET Recent Labs    11/04/18 1545 11/04/18 1550 11/05/18 0432 11/06/18 0435  NA 134*  --  134* 135  K 4.0  --  3.7 3.5  CL 101  --  103 101  CO2 20*  --  21* 24  GLUCOSE 109*  --  111* 99  BUN 12  --  12 10  CREATININE 1.02 0.90 0.86 0.91  CALCIUM 8.0*  --  7.8* 8.1*   LFT Recent Labs    11/06/18 0435  PROT 4.8*  ALBUMIN 2.3*  AST 44*  ALT 67*  ALKPHOS 429*  BILITOT 1.0   PT/INR Recent Labs    11/04/18 1545  LABPROT 17.3*  INR 1.4*   Hepatitis Panel No results for input(s): HEPBSAG, HCVAB, HEPAIGM, HEPBIGM in the last 72 hours.  Ct Angio Head W Or Wo Contrast  Result Date: 11/04/2018 CLINICAL DATA:  Aphasia.  Decreased responsiveness. EXAM: CT ANGIOGRAPHY HEAD AND NECK TECHNIQUE: Multidetector CT imaging of the head and neck was performed using the standard protocol during bolus administration of intravenous contrast. Multiplanar CT image reconstructions and MIPs were obtained to evaluate the vascular anatomy. Carotid stenosis measurements (when applicable) are obtained utilizing NASCET criteria, using the distal internal carotid diameter as the denominator. CONTRAST:  49mL OMNIPAQUE IOHEXOL  350 MG/ML SOLN COMPARISON:  None. FINDINGS: CTA NECK FINDINGS Aortic arch: Standard 3 vessel aortic arch with mild atherosclerotic plaque. Widely patent arch vessel origins. Right carotid system: Patent with mild plaque about the carotid bifurcation. No evidence of significant stenosis or dissection. Left carotid system: Patent with mild plaque about the carotid bifurcation. No evidence of significant stenosis or dissection. Vertebral arteries: Patent without evidence of significant stenosis or dissection. Nonstenotic calcified  plaque at the right vertebral artery origin. Mildly dominant left vertebral artery. Skeleton: Mild cervical spondylosis. Other neck: No neck mass or enlarged lymph nodes identified. Upper chest: Clear lung apices. Review of the MIP images confirms the above findings CTA HEAD FINDINGS Anterior circulation: The internal carotid arteries are patent from skull base to carotid termini with mild nonstenotic plaque bilaterally. ACAs and MCAs are patent with branch vessel irregularity but no evidence of proximal branch occlusion or significant proximal stenosis. No aneurysm is identified. Posterior circulation: The intracranial vertebral arteries are patent to the basilar. Patent left PICA and bilateral SCAs are visualized. AICAs and a right PICA are not clearly identified. The basilar artery is widely patent. Posterior communicating arteries are not clearly identified and may be diminutive or absent. PCAs are patent without evidence of significant proximal stenosis. No aneurysm is identified. Venous sinuses: Patent. Anatomic variants: None. Review of the MIP images confirms the above findings IMPRESSION: 1. No arch vessel occlusion. 2. Mild atherosclerosis in the head and neck without significant stenosis. 3.  Aortic Atherosclerosis (ICD10-I70.0). These results were communicated to Dr. Lorraine Lax at 4:19 pm on 11/04/2018 by text page via the [facility] messaging system. Electronically Signed   By: Logan Bores M.D.   On: 11/04/2018 16:33   Ct Angio Neck W Or Wo Contrast  Result Date: 11/04/2018 CLINICAL DATA:  Aphasia.  Decreased responsiveness. EXAM: CT ANGIOGRAPHY HEAD AND NECK TECHNIQUE: Multidetector CT imaging of the head and neck was performed using the standard protocol during bolus administration of intravenous contrast. Multiplanar CT image reconstructions and MIPs were obtained to evaluate the vascular anatomy. Carotid stenosis measurements (when applicable) are obtained utilizing NASCET criteria, using the distal  internal carotid diameter as the denominator. CONTRAST:  59mL OMNIPAQUE IOHEXOL 350 MG/ML SOLN COMPARISON:  None. FINDINGS: CTA NECK FINDINGS Aortic arch: Standard 3 vessel aortic arch with mild atherosclerotic plaque. Widely patent arch vessel origins. Right carotid system: Patent with mild plaque about the carotid bifurcation. No evidence of significant stenosis or dissection. Left carotid system: Patent with mild plaque about the carotid bifurcation. No evidence of significant stenosis or dissection. Vertebral arteries: Patent without evidence of significant stenosis or dissection. Nonstenotic calcified plaque at the right vertebral artery origin. Mildly dominant left vertebral artery. Skeleton: Mild cervical spondylosis. Other neck: No neck mass or enlarged lymph nodes identified. Upper chest: Clear lung apices. Review of the MIP images confirms the above findings CTA HEAD FINDINGS Anterior circulation: The internal carotid arteries are patent from skull base to carotid termini with mild nonstenotic plaque bilaterally. ACAs and MCAs are patent with branch vessel irregularity but no evidence of proximal branch occlusion or significant proximal stenosis. No aneurysm is identified. Posterior circulation: The intracranial vertebral arteries are patent to the basilar. Patent left PICA and bilateral SCAs are visualized. AICAs and a right PICA are not clearly identified. The basilar artery is widely patent. Posterior communicating arteries are not clearly identified and may be diminutive or absent. PCAs are patent without evidence of significant proximal stenosis. No aneurysm is identified. Venous sinuses: Patent.  Anatomic variants: None. Review of the MIP images confirms the above findings IMPRESSION: 1. No arch vessel occlusion. 2. Mild atherosclerosis in the head and neck without significant stenosis. 3.  Aortic Atherosclerosis (ICD10-I70.0). These results were communicated to Dr. Lorraine Lax at 4:19 pm on 11/04/2018 by  text page via the [facility] messaging system. Electronically Signed   By: Logan Bores M.D.   On: 11/04/2018 16:33   Mr Brain Wo Contrast  Result Date: 11/05/2018 CLINICAL DATA:  63 y/o  M; episode of confusion and aphasia. EXAM: MRI HEAD WITHOUT CONTRAST TECHNIQUE: Multiplanar, multiecho pulse sequences of the brain and surrounding structures were obtained without intravenous contrast. COMPARISON:  11/04/2018 CT head and CTA head. FINDINGS: Brain: No acute infarction, hemorrhage, hydrocephalus, extra-axial collection or mass lesion. Punctate and early confluent nonspecific T2 FLAIR hyperintensities in subcortical and periventricular white matter are compatible with mild to moderate chronic microvascular ischemic changes. Mild volume loss of the brain. Numerous punctate and small foci of susceptibility hypointensity are present throughout the brain in a predominantly peripheral distribution compatible with chronic microhemorrhage. Vascular: Normal flow voids. Skull and upper cervical spine: Normal marrow signal. Sinuses/Orbits: Negative. Other: None. IMPRESSION: 1. No acute intracranial abnormality identified. 2. Mild-to-moderate chronic microvascular ischemic changes and mild volume loss of the brain. 3. 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 Electronically Signed   By: Kristine Garbe M.D.   On: 11/05/2018 01:39   Mr Brain W Contrast  Result Date: 11/05/2018 CLINICAL DATA:  Follow-up intracranial hemorrhage. EXAM: MRI HEAD WITH CONTRAST TECHNIQUE: Multiplanar, multiecho pulse sequences of the brain and surrounding structures were obtained with intravenous contrast. CONTRAST:  9 cc Gadavist intravenous COMPARISON:  Noncontrast brain MRI from earlier today FINDINGS: Brain: No abnormal intracranial enhancement to suggest metastatic disease or inflammation. The patient has numerous  foci of susceptibility artifact on prior gradient imaging that are not apparent on postcontrast T1. Vascular: Major vessels are enhancing. Skull and upper cervical spine: Negative for marrow lesion Sinuses/Orbits: Negative IMPRESSION: No abnormal enhancement.  No evidence of metastatic disease. Electronically Signed   By: Monte Fantasia M.D.   On: 11/05/2018 11:51   Dg Chest Portable 1 View  Result Date: 11/04/2018 CLINICAL DATA:  Fever, on chemotherapy EXAM: PORTABLE CHEST 1 VIEW COMPARISON:  11/03/2018 FINDINGS: There is a right-sided Port-A-Cath in satisfactory position. There is no focal parenchymal opacity. There is no pleural effusion or pneumothorax. The heart and mediastinal contours are unremarkable. The osseous structures are unremarkable. IMPRESSION: No active disease. Electronically Signed   By: Kathreen Devoid   On: 11/04/2018 17:03   Ct Head Code Stroke Wo Contrast  Result Date: 11/04/2018 CLINICAL DATA:  Code stroke.  Aphasia.  Decreased responsiveness. EXAM: CT HEAD WITHOUT CONTRAST TECHNIQUE: Contiguous axial images were obtained from the base of the skull through the vertex without intravenous contrast. COMPARISON:  None. FINDINGS: Brain: There is an approximately 1 cm focus of hypoattenuation involving cortex and subcortical white matter in the right superior frontal gyrus suggestive of an infarct of indeterminate acuity. Patchy hypodensities are present elsewhere in the cerebral white matter bilaterally, nonspecific but compatible with mild chronic small vessel ischemic disease. No intracranial hemorrhage, mass, midline shift, or extra-axial fluid collection is identified. The ventricles and sulci are normal for age. Vascular: Calcified atherosclerosis at the skull base. No hyperdense vessel. Skull: No fracture or focal osseous lesion. Sinuses/Orbits: Air cell opacification at the right mastoid tip. Clear paranasal  sinuses. Unremarkable orbits. Other: None. ASPECTS [facility] Stroke Program  Early CT Score) 9 or 10 for the right MCA territory, 10 for the left MCA territory IMPRESSION: 1. Small age indeterminate infarct in the right superior frontal gyrus. 2. No intracranial hemorrhage. 3. Mild chronic small vessel ischemic disease. These results were communicated to Dr. Lorraine Lax at 4:19 pm on 11/04/2018 by text page via the [facility] messaging system. Electronically Signed   By: Logan Bores M.D.   On: 11/04/2018 16:20   US Abdomen Limited Ruq  Result Date: 11/04/2018 CLINICAL DATA:  Fever. History of metastatic pancreatic cancer. Biliary stent placed 2019 EXAM: ULTRASOUND ABDOMEN LIMITED RIGHT UPPER QUADRANT COMPARISON:  10/30/2018 CT FINDINGS: Gallbladder: No visible stones. There is mild gallbladder wall thickening at 4 mm. Surrounding pericholecystic fluid. Sludge noted within the gallbladder. Common bile duct: Diameter: Biliary stent in place which obscures the mid to distal common bile duct. Proximally, the common bile duct measures 5 mm. Liver: Heterogeneous echotexture throughout the liver. Increased echotexture throughout much of the liver, particularly near the gallbladder fossa. This likely reflects focal fatty infiltration. No visible well-defined focal mass lesion. Portal vein is patent on color Doppler imaging with normal direction of blood flow towards the liver. Portal vein is prominent measuring up to 2.4 cm. IMPRESSION: Mild gallbladder wall thickening with pericholecystic fluid and sludge within the gallbladder. No tenderness over the gallbladder during the study or stones visualized. Heterogeneous echotexture throughout the liver which may reflect focal/geographic fatty infiltration. Prominent portal vein, 2.4 cm. Portal vein is patent with blood flow toward the liver. Electronically Signed   By: Rolm Baptise M.D.   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

## 2018-11-06 NOTE — Progress Notes (Addendum)
HEMATOLOGY-ONCOLOGY PROGRESS NOTE  SUBJECTIVE: Patient has a history of metastatic pancreatic cancer. Admitted for fever/chills. Blood cultures from 5/2 positive for strep. On Ceftriaxone. Afebrile. No recent chills. Has a headache today. No abdominal pain, nausea, vomiting. No other complaints today. Due for MRI of liver this afternoon.      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[place])   08/21/2017 Initial Diagnosis    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[place])    08/31/2017 - 11/22/2017 Chemotherapy    The patient had palonosetron (ALOXI) injection 0.25 mg, 0.25 mg, Intravenous,  Once, 6 of 6 cycles Administration: 0.25 mg (08/31/2017), 0.25 mg (09/14/2017), 0.25 mg (09/28/2017), 0.25 mg (10/12/2017), 0.25 mg (10/26/2017), 0.25 mg (11/09/2017) pegfilgrastim-cbqv (UDENYCA) injection 6 mg, 6 mg, Subcutaneous, Once, 6 of 6 cycles Administration: 6 mg (09/02/2017), 6 mg (09/16/2017), 6 mg (09/30/2017), 6 mg (10/14/2017), 6 mg (10/28/2017), 6 mg (11/11/2017) irinotecan (CAMPTOSAR) 360 mg in sodium chloride 0.9 % 500 mL chemo infusion, 150 mg/m2 = 360 mg (100 % of original dose 150 mg/m2), Intravenous,  Once, 6 of 6 cycles Dose modification: 150 mg/m2 (original dose 150 mg/m2, Cycle 1, Reason: Provider Judgment) Administration: 360 mg (08/31/2017), 360 mg (09/14/2017), 360 mg (09/28/2017), 340 mg (10/12/2017), 340 mg (10/26/2017), 340 mg (11/09/2017) leucovorin 952 mg in sodium chloride 0.9 % 250 mL infusion, 400 mg/m2 = 952 mg, Intravenous,  Once, 6 of 6 cycles Administration: 952 mg (08/31/2017), 952 mg (09/14/2017), 952 mg (09/28/2017), 888 mg (10/12/2017), 888 mg (10/26/2017), 888 mg (11/09/2017) oxaliplatin (ELOXATIN) 200 mg in dextrose 5 % 500 mL chemo infusion, 85 mg/m2 = 200 mg, Intravenous,  Once, 6 of 6 cycles Dose modification: 65 mg/m2 (original dose 85 mg/m2, Cycle 4, Reason: Provider Judgment) Administration: 200 mg (08/31/2017), 200 mg (09/14/2017), 200 mg (09/28/2017), 145 mg (10/12/2017), 150 mg (10/26/2017), 150 mg  (11/09/2017) fosaprepitant (EMEND) 150 mg, dexamethasone (DECADRON) 12 mg in sodium chloride 0.9 % 145 mL IVPB, , Intravenous,  Once, 6 of 6 cycles Administration:  (08/31/2017),  (09/14/2017),  (09/28/2017),  (10/12/2017),  (10/26/2017),  (11/09/2017) fluorouracil (ADRUCIL) 5,700 mg in sodium chloride 0.9 % 136 mL chemo infusion, 2,400 mg/m2 = 5,700 mg, Intravenous, 1 Day/Dose, 6 of 6 cycles Administration: 5,700 mg (08/31/2017), 5,700 mg (09/14/2017), 5,700 mg (09/28/2017), 5,350 mg (10/12/2017), 5,350 mg (10/26/2017), 5,350 mg (11/09/2017)  for chemotherapy treatment.     12/06/2017 - 02/28/2018 Chemotherapy    The patient had gemcitabine (GEMZAR) 1,786 mg in sodium chloride 0.9 % 250 mL chemo infusion, 800 mg/m2 = 1,786 mg (80 % of original dose 1,000 mg/m2), Intravenous,  Once, 4 of 4 cycles Dose modification: 800 mg/m2 (original dose 1,000 mg/m2, Cycle 1, Reason: Provider Judgment), 800 mg/m2 (original dose 1,000 mg/m2, Cycle 1, Reason: Provider Judgment, Comment: call from MD to decrease dose due to counts) Administration: 1,786 mg (12/20/2017), 1,786 mg (12/06/2017), 1,786 mg (01/03/2018), 1,786 mg (01/17/2018), 1,786 mg (01/31/2018), 1,786 mg (02/14/2018), 1,786 mg (02/28/2018) PACLitaxel-protein bound (ABRAXANE) chemo infusion 225 mg, 100 mg/m2 = 225 mg (80 % of original dose 125 mg/m2), Intravenous, Once, 3 of 3 cycles Dose modification: 100 mg/m2 (original dose 125 mg/m2, Cycle 1, Reason: Provider Judgment), 100 mg/m2 (original dose 125 mg/m2, Cycle 1, Reason: Provider Judgment, Comment: call from MD to decrease dose based on counts) Administration: 225 mg (12/20/2017), 225 mg (12/06/2017), 225 mg (01/03/2018), 225 mg (01/17/2018)  for chemotherapy treatment.     07/04/2018 Cancer Staging    Staging form: Exocrine Pancreas, AJCC 8th Edition - Pathologic: Stage  IV (cM1) - Signed by Ladell Pier, MD on 07/04/2018    08/21/2018 -  Chemotherapy    The patient had palonosetron (ALOXI) injection 0.25 mg, 0.25 mg,  Intravenous,  Once, 5 of 6 cycles Administration: 0.25 mg (08/21/2018), 0.25 mg (09/03/2018), 0.25 mg (09/18/2018), 0.25 mg (10/01/2018), 0.25 mg (10/16/2018) pegfilgrastim-cbqv (UDENYCA) injection 6 mg, 6 mg, Subcutaneous, Once, 5 of 6 cycles Administration: 6 mg (08/23/2018), 6 mg (09/05/2018), 6 mg (09/20/2018), 6 mg (10/03/2018), 6 mg (10/18/2018) irinotecan (CAMPTOSAR) 360 mg in dextrose 5 % 500 mL chemo infusion, 150 mg/m2 = 360 mg (100 % of original dose 150 mg/m2), Intravenous,  Once, 5 of 6 cycles Dose modification: 150 mg/m2 (original dose 150 mg/m2, Cycle 1, Reason: Provider Judgment), 112.5 mg/m2 (original dose 150 mg/m2, Cycle 1, Reason: Change in LFTs) Administration: 260 mg (08/21/2018), 260 mg (09/03/2018), 260 mg (09/18/2018), 260 mg (10/01/2018), 200 mg (10/16/2018) leucovorin 952 mg in dextrose 5 % 250 mL infusion, 400 mg/m2 = 952 mg, Intravenous,  Once, 5 of 6 cycles Dose modification: 300 mg/m2 (original dose 400 mg/m2, Cycle 1, Reason: Provider Judgment) Administration: 696 mg (08/21/2018), 696 mg (09/03/2018), 696 mg (09/18/2018), 676 mg (10/01/2018), 676 mg (10/16/2018) oxaliplatin (ELOXATIN) 155 mg in dextrose 5 % 500 mL chemo infusion, 65 mg/m2 = 155 mg (100 % of original dose 65 mg/m2), Intravenous,  Once, 5 of 6 cycles Dose modification: 65 mg/m2 (original dose 65 mg/m2, Cycle 1, Reason: Provider Judgment) Administration: 150 mg (08/21/2018), 150 mg (09/03/2018), 150 mg (09/18/2018), 150 mg (10/01/2018), 150 mg (10/16/2018) fosaprepitant (EMEND) 150 mg, dexamethasone (DECADRON) 12 mg in sodium chloride 0.9 % 145 mL IVPB, , Intravenous,  Once, 5 of 6 cycles Administration:  (08/21/2018),  (09/03/2018),  (09/18/2018),  (10/01/2018),  (10/16/2018) fluorouracil (ADRUCIL) 5,700 mg in sodium chloride 0.9 % 136 mL chemo infusion, 2,400 mg/m2 = 5,700 mg, Intravenous, 1 Day/Dose, 5 of 6 cycles Dose modification: 1,800 mg/m2 (original dose 2,400 mg/m2, Cycle 1, Reason: Change in LFTs) Administration: 4,200 mg  (08/21/2018), 4,200 mg (09/03/2018), 4,200 mg (09/18/2018), 4,050 mg (10/01/2018), 4,050 mg (10/16/2018)  for chemotherapy treatment.       REVIEW OF SYSTEMS:   Constitutional: No recent fever or chills.  Eyes: Denies blurriness of vision Ears, nose, mouth, throat, and face: Denies mucositis or sore throat Respiratory: Denies cough, dyspnea or wheezes Cardiovascular: Denies palpitation, chest discomfort Gastrointestinal:  Denies nausea, heartburn or change in bowel habits Skin: Denies abnormal skin rashes Lymphatics: Denies new lymphadenopathy or easy bruising Neurological:Denies numbness, tingling or new weaknesses. Reports headache. Behavioral/Psych: Mood is stable, no new changes  Extremities: No lower extremity edema All other systems were reviewed with the patient and are negative.  I have reviewed the past medical history, past surgical history, social history and family history with the patient and they are unchanged from previous note.   PHYSICAL EXAMINATION:  Vitals:   11/06/18 0803 11/06/18 1100  BP: (!) 153/94 (!) 162/93  Pulse: 61 61  Resp: 18 16  Temp: 97.8 F (36.6 C) 97.8 F (36.6 C)  SpO2: 100% 99%   Filed Weights   11/05/18 0151  Weight: 207 lb 10.8 oz (94.2 kg)    Intake/Output from previous day: 05/04 0701 - 05/05 0700 In: 100 [IV Piggyback:100] Out: 700 [Urine:700]  GENERAL:alert, no distress and comfortable SKIN: skin color, texture, turgor are normal, no rashes or significant lesions EYES: normal, Conjunctiva are pink and non-injected, sclera clear OROPHARYNX:no exudate, no erythema and lips, buccal mucosa, and tongue normal  NECK: supple, thyroid normal size, non-tender, without nodularity LYMPH:  no palpable lymphadenopathy in the cervical, axillary or inguinal LUNGS: clear to auscultation and percussion with normal breathing effort HEART: regular rate & rhythm and no murmurs and no lower extremity edema ABDOMEN:abdomen soft, non-tender and  normal bowel sounds Musculoskeletal:no cyanosis of digits and no clubbing  NEURO: alert & oriented x 3 with fluent speech, no focal motor/sensory deficits  LABORATORY DATA:  I have reviewed the data as listed CMP Latest Ref Rng & Units 11/06/2018 11/05/2018 11/04/2018  Glucose 70 - 99 mg/dL 99 111(H) -  BUN 8 - 23 mg/dL 10 12 -  Creatinine 0.61 - 1.24 mg/dL 0.91 0.86 0.90  Sodium 135 - 145 mmol/L 135 134(L) -  Potassium 3.5 - 5.1 mmol/L 3.5 3.7 -  Chloride 98 - 111 mmol/L 101 103 -  CO2 22 - 32 mmol/L 24 21(L) -  Calcium 8.9 - 10.3 mg/dL 8.1(L) 7.8(L) -  Total Protein 6.5 - 8.1 g/dL 4.8(L) 4.7(L) -  Total Bilirubin 0.3 - 1.2 mg/dL 1.0 1.2 -  Alkaline Phos 38 - 126 U/L 429(H) 450(H) -  AST 15 - 41 U/L 44(H) 75(H) -  ALT 0 - 44 U/L 67(H) 87(H) -    Lab Results  Component Value Date   WBC 4.2 11/06/2018   HGB 9.2 (L) 11/06/2018   HCT 28.5 (L) 11/06/2018   MCV 85.3 11/06/2018   PLT 75 (L) 11/06/2018   NEUTROABS 12.1 (H) 11/04/2018    Ct Angio Head W Or Wo Contrast  Result Date: 11/04/2018 CLINICAL DATA:  Aphasia.  Decreased responsiveness. EXAM: CT ANGIOGRAPHY HEAD AND NECK TECHNIQUE: Multidetector CT imaging of the head and neck was performed using the standard protocol during bolus administration of intravenous contrast. Multiplanar CT image reconstructions and MIPs were obtained to evaluate the vascular anatomy. Carotid stenosis measurements (when applicable) are obtained utilizing NASCET criteria, using the distal internal carotid diameter as the denominator. CONTRAST:  34m OMNIPAQUE IOHEXOL 350 MG/ML SOLN COMPARISON:  None. FINDINGS: CTA NECK FINDINGS Aortic arch: Standard 3 vessel aortic arch with mild atherosclerotic plaque. Widely patent arch vessel origins. Right carotid system: Patent with mild plaque about the carotid bifurcation. No evidence of significant stenosis or dissection. Left carotid system: Patent with mild plaque about the carotid bifurcation. No evidence of significant  stenosis or dissection. Vertebral arteries: Patent without evidence of significant stenosis or dissection. Nonstenotic calcified plaque at the right vertebral artery origin. Mildly dominant left vertebral artery. Skeleton: Mild cervical spondylosis. Other neck: No neck mass or enlarged lymph nodes identified. Upper chest: Clear lung apices. Review of the MIP images confirms the above findings CTA HEAD FINDINGS Anterior circulation: The internal carotid arteries are patent from skull base to carotid termini with mild nonstenotic plaque bilaterally. ACAs and MCAs are patent with branch vessel irregularity but no evidence of proximal branch occlusion or significant proximal stenosis. No aneurysm is identified. Posterior circulation: The intracranial vertebral arteries are patent to the basilar. Patent left PICA and bilateral SCAs are visualized. AICAs and a right PICA are not clearly identified. The basilar artery is widely patent. Posterior communicating arteries are not clearly identified and may be diminutive or absent. PCAs are patent without evidence of significant proximal stenosis. No aneurysm is identified. Venous sinuses: Patent. Anatomic variants: None. Review of the MIP images confirms the above findings IMPRESSION: 1. No arch vessel occlusion. 2. Mild atherosclerosis in the head and neck without significant stenosis. 3.  Aortic Atherosclerosis (ICD10-I70.0). These results were  communicated to Dr. Lorraine Lax at 4:19 pm on 11/04/2018 by text page via the [facility] messaging system. Electronically Signed   By: Logan Bores M.D.   On: 11/04/2018 16:33   Ct Angio Neck W Or Wo Contrast  Result Date: 11/04/2018 CLINICAL DATA:  Aphasia.  Decreased responsiveness. EXAM: CT ANGIOGRAPHY HEAD AND NECK TECHNIQUE: Multidetector CT imaging of the head and neck was performed using the standard protocol during bolus administration of intravenous contrast. Multiplanar CT image reconstructions and MIPs were obtained to evaluate the  vascular anatomy. Carotid stenosis measurements (when applicable) are obtained utilizing NASCET criteria, using the distal internal carotid diameter as the denominator. CONTRAST:  53m OMNIPAQUE IOHEXOL 350 MG/ML SOLN COMPARISON:  None. FINDINGS: CTA NECK FINDINGS Aortic arch: Standard 3 vessel aortic arch with mild atherosclerotic plaque. Widely patent arch vessel origins. Right carotid system: Patent with mild plaque about the carotid bifurcation. No evidence of significant stenosis or dissection. Left carotid system: Patent with mild plaque about the carotid bifurcation. No evidence of significant stenosis or dissection. Vertebral arteries: Patent without evidence of significant stenosis or dissection. Nonstenotic calcified plaque at the right vertebral artery origin. Mildly dominant left vertebral artery. Skeleton: Mild cervical spondylosis. Other neck: No neck mass or enlarged lymph nodes identified. Upper chest: Clear lung apices. Review of the MIP images confirms the above findings CTA HEAD FINDINGS Anterior circulation: The internal carotid arteries are patent from skull base to carotid termini with mild nonstenotic plaque bilaterally. ACAs and MCAs are patent with branch vessel irregularity but no evidence of proximal branch occlusion or significant proximal stenosis. No aneurysm is identified. Posterior circulation: The intracranial vertebral arteries are patent to the basilar. Patent left PICA and bilateral SCAs are visualized. AICAs and a right PICA are not clearly identified. The basilar artery is widely patent. Posterior communicating arteries are not clearly identified and may be diminutive or absent. PCAs are patent without evidence of significant proximal stenosis. No aneurysm is identified. Venous sinuses: Patent. Anatomic variants: None. Review of the MIP images confirms the above findings IMPRESSION: 1. No arch vessel occlusion. 2. Mild atherosclerosis in the head and neck without significant  stenosis. 3.  Aortic Atherosclerosis (ICD10-I70.0). These results were communicated to Dr. ALorraine Laxat 4:19 pm on 11/04/2018 by text page via the [facility]. Electronically Signed   By: ALogan BoresM.D.   On: 11/04/2018 16:33   Mr Brain Wo Contrast  Result Date: 11/05/2018 CLINICAL DATA:  63y/o  M; episode of confusion and aphasia. EXAM: MRI HEAD WITHOUT CONTRAST TECHNIQUE: Multiplanar, multiecho pulse sequences of the brain and surrounding structures were obtained without intravenous contrast. COMPARISON:  11/04/2018 CT head and CTA head. FINDINGS: Brain: No acute infarction, hemorrhage, hydrocephalus, extra-axial collection or mass lesion. Punctate and early confluent nonspecific T2 FLAIR hyperintensities in subcortical and periventricular white matter are compatible with mild to moderate chronic microvascular ischemic changes. Mild volume loss of the brain. Numerous punctate and small foci of susceptibility hypointensity are present throughout the brain in a predominantly peripheral distribution compatible with chronic microhemorrhage. Vascular: Normal flow voids. Skull and upper cervical spine: Normal marrow signal. Sinuses/Orbits: Negative. Other: None. IMPRESSION: 1. No acute intracranial abnormality identified. 2. Mild-to-moderate chronic microvascular ischemic changes and mild volume loss of the brain. 3. 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 Electronically Signed   By: LKristine GarbeM.D.   On: 11/05/2018 01:39   Mr Brain W Contrast  Result Date: 11/05/2018 CLINICAL DATA:  Follow-up intracranial hemorrhage. EXAM: MRI HEAD WITH CONTRAST TECHNIQUE: Multiplanar, multiecho pulse sequences of the brain and surrounding structures were obtained with intravenous contrast. CONTRAST:  9 cc Gadavist intravenous COMPARISON:  Noncontrast brain MRI from earlier today  FINDINGS: Brain: No abnormal intracranial enhancement to suggest metastatic disease or inflammation. The patient has numerous foci of susceptibility artifact on prior gradient imaging that are not apparent on postcontrast T1. Vascular: Major vessels are enhancing. Skull and upper cervical spine: Negative for marrow lesion Sinuses/Orbits: Negative IMPRESSION: No abnormal enhancement.  No evidence of metastatic disease. Electronically Signed   By: Monte Fantasia M.D.   On: 11/05/2018 11:51   Ct Abdomen Pelvis W Contrast  Result Date: 10/31/2018 CLINICAL DATA:  Pancreatic carcinoma. Liver metastasis. Esophageal chemotherapy ongoing. Five cycles. EXAM: CT ABDOMEN AND PELVIS WITH CONTRAST TECHNIQUE: Multidetector CT imaging of the abdomen and pelvis was performed using the standard protocol following bolus administration of intravenous contrast. CONTRAST:  163m ISOVUE-300 IOPAMIDOL (ISOVUE-300) INJECTION 61% COMPARISON:  MRI 08/08/2018, 08/03/2017 FINDINGS: Lower chest:  Lung bases are clear Hepatobiliary: Differential perfusion with hypoperfusion of the LEFT hepatic lobe and normal perfusion of the RIGHT hepatic lobe. Common bile duct stent in place. Expected pneumobilia in the nondependent liver. No evidence of biliary duct dilatation. The hepatic metastasis identified on comparison MRI are not well appreciated by CT imaging. Some lesions along the gallbladder fossa are noted presumed metastasis (image 32/2). The gallbladder wall is thickened which is increased from comparison exam. Pancreas: Mass in the head of the pancreas/uncinate measures 3.8 by 2.8 cm and difficult to measure mass on comparison MRI but the uncinate mass appears slightly larger with the uncinate measuring 2.7 x 2.5 on comparison MRI (image 66/series 07/05/2001). Mild pancreatic duct dilatation unchanged. Spleen: Normal spleen. Adrenals/urinary tract: Nodule within the RIGHT adrenal gland measuring 13 mm unchanged kidneys are normal.  Stomach/Bowel: Stomach, duodenum small-bowel normal. There is some bowel wall thickening in the ascending colon cecum small amount of inflammation (image 70/2). The more distal colon rectosigmoid colon normal. Small amount of fluid along the LEFT pericolic gutter is noted. Small amount fluid in the pelvis additionally. Vascular/Lymphatic: Abdominal aortic normal caliber. No retroperitoneal periportal lymphadenopathy. Portal veins are patent. Hepatic veins patent. Hepatic artery is difficult to follow. Musculoskeletal: No aggressive osseous lesion IMPRESSION: 1. Mass in the uncinate of the pancreas does not appear improved and may be slightly larger. 2. Discrete hepatic metastasis are difficult to measure compared to comparison MRI. Suspected metastasis along the gallbladder fossa are noted. 3. Differential perfusion of the liver with hypoperfusion to the entire LEFT hepatic lobe. Portal veins patent. 4. Biliary stent in place without biliary duct dilatation. 5. New gallbladder wall thickening. This may relate to new intraperitoneal free fluid. Recommend correlation for cholecystitis. 6. Small amount of fluid along the pericolic gutters and pelvis and along the RIGHT hepatic margin is new from prior. 7. Small bowel inflammation involving the ascending colon is nonspecific. Normal white blood cell weighs against typhlitis. Electronically Signed   By: SSuzy BouchardM.D.   On: 10/31/2018 08:07   Dg Chest Portable 1 View  Result Date: 11/04/2018 CLINICAL DATA:  Fever, on chemotherapy EXAM: PORTABLE CHEST 1 VIEW COMPARISON:  11/03/2018 FINDINGS: There is a right-sided Port-A-Cath in satisfactory position. There is no focal parenchymal opacity. There is no pleural effusion or pneumothorax. The heart and mediastinal contours are unremarkable. The osseous structures are unremarkable. IMPRESSION: No active disease. Electronically Signed  By: Kathreen Devoid   On: 11/04/2018 17:03   Dg Chest Port 1 View  Result Date:  11/03/2018 CLINICAL DATA:  Fever.  History of pancreatic cancer. EXAM: PORTABLE CHEST 1 VIEW COMPARISON:  June 22, 2018 FINDINGS: The right Port-A-Cath is stable. The heart, hila, mediastinum, lungs, and pleura are normal. IMPRESSION: No active disease. Electronically Signed   By: Dorise Bullion III M.D   On: 11/03/2018 21:25   Ct Head Code Stroke Wo Contrast  Result Date: 11/04/2018 CLINICAL DATA:  Code stroke.  Aphasia.  Decreased responsiveness. EXAM: CT HEAD WITHOUT CONTRAST TECHNIQUE: Contiguous axial images were obtained from the base of the skull through the vertex without intravenous contrast. COMPARISON:  None. FINDINGS: Brain: There is an approximately 1 cm focus of hypoattenuation involving cortex and subcortical white matter in the right superior frontal gyrus suggestive of an infarct of indeterminate acuity. Patchy hypodensities are present elsewhere in the cerebral white matter bilaterally, nonspecific but compatible with mild chronic small vessel ischemic disease. No intracranial hemorrhage, mass, midline shift, or extra-axial fluid collection is identified. The ventricles and sulci are normal for age. Vascular: Calcified atherosclerosis at the skull base. No hyperdense vessel. Skull: No fracture or focal osseous lesion. Sinuses/Orbits: Air cell opacification at the right mastoid tip. Clear paranasal sinuses. Unremarkable orbits. Other: None. ASPECTS [facility] Stroke Program Early CT Score) 9 or 10 for the right MCA territory, 10 for the left MCA territory IMPRESSION: 1. Small age indeterminate infarct in the right superior frontal gyrus. 2. No intracranial hemorrhage. 3. Mild chronic small vessel ischemic disease. These results were communicated to Dr. Lorraine Lax at 4:19 pm on 11/04/2018 by text page via the [facility] messaging system. Electronically Signed   By: Logan Bores M.D.   On: 11/04/2018 16:20   US Abdomen Limited Ruq  Result Date: 11/04/2018 CLINICAL DATA:  Fever. History of metastatic  pancreatic cancer. Biliary stent placed 2019 EXAM: ULTRASOUND ABDOMEN LIMITED RIGHT UPPER QUADRANT COMPARISON:  10/30/2018 CT FINDINGS: Gallbladder: No visible stones. There is mild gallbladder wall thickening at 4 mm. Surrounding pericholecystic fluid. Sludge noted within the gallbladder. Common bile duct: Diameter: Biliary stent in place which obscures the mid to distal common bile duct. Proximally, the common bile duct measures 5 mm. Liver: Heterogeneous echotexture throughout the liver. Increased echotexture throughout much of the liver, particularly near the gallbladder fossa. This likely reflects focal fatty infiltration. No visible well-defined focal mass lesion. Portal vein is patent on color Doppler imaging with normal direction of blood flow towards the liver. Portal vein is prominent measuring up to 2.4 cm. IMPRESSION: Mild gallbladder wall thickening with pericholecystic fluid and sludge within the gallbladder. No tenderness over the gallbladder during the study or stones visualized. Heterogeneous echotexture throughout the liver which may reflect focal/geographic fatty infiltration. Prominent portal vein, 2.4 cm. Portal vein is patent with blood flow toward the liver. Electronically Signed   By: Rolm Baptise M.D.   On: 11/04/2018 19:26    ASSESSMENT AND PLAN: 1. Pancreas head mass  CT abdomen/pelvis 08/03/2017-fullness in the head of the pancreas with stranding in the peripancreatic fat and pancreatic ductal dilatation.   MRI abdomen 08/04/2017-findings favored to represent acute on chronic pancreatitis; equivocal soft tissue fullness within the head and uncinate process of the pancreas; indeterminate too small to characterize right hepatic lobe lesion; small volume abdominal ascites; splenomegaly.   CA-19-9 elevated at 977on 08/04/2017.  Status postupper EUS 08/10/2017-findings of an irregular masslike region in the pancreatic head measuring approximately 2.7 cm.  This directly abutted the  main portal vein but no other major vascular structures. Biopsies obtained with preliminary cytology positive for malignancy, likely adenocarcinoma. The final report is pending. The common bile duct and main pancreatic duct were both dilated. ERCP was then proceeded with forstent placement. Multiple attempts were made tocannulate the bile duct without success. The procedure was aborted.  08/16/2017 ERCP with placement of a metal biliary stent in the common bile duct by Dr. Francella Solian at [facility] [facility]  Cycle 1 FOLFIRINOX 08/31/2017  Cycle 2 FOLFIRINOX 09/14/2017  Cycle 3 FOLFIRINOX 09/28/2017  Cycle 4 FOLFIRINOX 10/12/2017 (oxaliplatin dose reduced secondary to thrombocytopenia)  Cycle 5 FOLFIRINOX 10/26/2017  Cycle 6 FOLFIRINOX 11/09/2017  Restaging CTs at [facility] 11/29/2017-no definitive evidence of distant metastatic disease. 2 subcentimeter liver lesions described on prior MRI not visualized on CT. Ill-defined pancreatic head mass stable to decreased in size measuring 2.1 x 2 cm. Peripancreatic inflammatory stranding decreased from prior. No biliary ductal dilatation. Celiac axis less than 180 degrees abutment. Common hepatic artery with greater than 180 degrees encasement; superior mesenteric artery with short segment less than 180 degrees abutment; portal vein/superior mesenteric vein with greater than 180 degrees encasement of the extrahepatic portal vein with associated circumferential narrowing at the portomesenteric, overall substantially improved from prior examination. Now less than 180 degree abutment of the SMV. The portal vein and SMV remain patent. Splenic vein patent.  Cycle 1 gemcitabine/Abraxane 12/06/2017  Cycle 2 gemcitabine/Abraxane 12/20/2017  Cycle 3 gemcitabine/Abraxane 01/03/2018  Cycle 4 gemcitabine/Abraxane 01/17/2018  Cycle 5 gemcitabine 01/31/2018 (Abraxane held due to neuropathy)  Cycle 6 gemcitabine 02/14/2018 (Abraxane held due to neuropathy)  Cycle 7 gemcitabine  02/28/2018 (Abraxane held due to neuropathy)  CT chest/abdomen/pelvis at [facility] 03/08/2018- stable appearance of previously identified infiltrating pancreatic head mass. No CT evidence of metastatic disease.  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 Ill-defined pancreatic head mass not felt to be significantly changed.  CT chest/abdomen/pelvis 05/10/2018- ill-defined hypoattenuating pancreatic head mass and ill-defined soft tissue abutting the celiac axis with mildly increased dilatation of the main pancreatic duct. New soft tissue nodule along the right upper lobe bronchus; hypoattenuating liver lesions concerning for metastasis.  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 The stent traverses the pancreatic mass and extends into the duodenum. Increase in size of 2 liver lesions. Pancreatic mass appears similar. Indistinct tissue planes around the duodenum along with wall thickening in the gallbladder and especially the gallbladder neck.  MRI/MRCP 08/08/2018-new and enlarging hepatic metastases, increase in size of the pancreatic head tumor, mild intrahepatic biliary dilatation,  thrombosis in segment 7 portal vein tributary, flattening of the main portal vein  ERCP 08/16/2018- 2 plastic biliary stents were removed from within the previously placed partially covered SEMS; stricture confirmed just proximal to the previously placed SEMS, treated with  placement of an 8 cm long 10 mm diameter uncovered SEMS in good position.  Cycle 1 FOLFIRINOX 08/21/2018  Cycle 2 FOLFIRINOX 09/03/2018  Cycle 3 FOLFIRINOX 09/18/2018  Cycle 4 FOLFIRINOX 10/01/2018  Cycle 5 FOLFIRINOX 10/16/2018 (irinotecan dose reduced secondary to diarrhea)  CTs 10/30/2018- slight enlargement of pancreas mass, hepatic lesions seen on MRI are not visualized, probable metastasis at the gallbladder fossa, no biliary duct dilatation, gallbladder wall thickening-new, small amount of fluid in the pericolic gutters, right perihepatic margin, and pelvis, thickening at the ascending colon 2. Hypertension 3. Port-A-Cath placement 08/30/2017 4. History of elevated bilirubin-questionGilbert's syndrome 5. Mild leukopenia, thrombocytopenia-question secondary to portal hypertension/splenomegaly;09/14/2017 white count improved, platelet count stable;progressive thrombocytopenia following gemcitabine/Abraxane 6. Kidney stones 7. Fever following cycles 2 and 3 gemcitabine/Abraxane,? Fever related to gemcitabine 8.Jaundice, elevated liver enzymes and bilirubin 06/22/2018; status post ERCP 06/25/2018 with debris removed from the existing stent and a new plastic stent placed. Recurrent jaundice 07/19/2018 status post ERCP 07/20/2018 with finding that the previously placed plastic stent had migrated distally. There was debris and sludge on the stent. The previously placed stent was removed. 5 to 8 mm stricture just proximal to the existing SEMS. 2 plastic stents were placed.ERCP 08/16/2018- 2 plastic biliary stents were removed from within the previously placed partially covered SEMS; stricture confirmed just proximal to the  previously placed SEMS, treated with placement of an 8 cm long 10 mm diameter uncovered SEMS in good position.  Mr. Knight is a 63 year old male with Metastatic pancreatic cancer. He is on FOLFIRINOX. Admitted with fever and chills and found to have positive blood cultures. Remains on IV antibiotics. Liver MRI is pending this afternoon. He has mild anemia and thrombocytopenia secondary to portal hypertension/splenomegaly. LFTs and T Bili elevated, but trending downward.   -Await results of liver MRI. -Monitor CBC and transfuse PRBC if Hemoglobin <7.0 or active bleeding and Platelets if <10,000 or active bleeding.  -Monitor LFTs -Antibiotics per primary team. -Will resume chemotherapy as an outpatient once infection resolved.     LOS: 2 days   Mikey Bussing, DNP, AGPCNP-BC, AOCNP 11/06/18   Mr. Falzone is well-known to me with a history of metastatic pancreas cancer, currently being treated with a course of salvage FOLFIRINOX chemotherapy.  He was due for a restaging liver MRI on 11/05/2018.  He is admitted with a fever/chills, and episode of confusion.  A blood culture turned positive for Streptococcus.  I suspect the hospital admission was related to cholangitis.  The liver enzymes have improved.  I have a low clinical suspicion for a primary CNS event for a Port-A-Cath infection.  He is alert and oriented today.  Recommendations: 1.  Complete outpatient course of antibiotics for treatment of the bacteremia 2.  Follow-up liver MRI result and decide on further FOLFIRINOX chemotherapy 3.  Outpatient follow-up will be scheduled at the Cancer center 4.  I updated his wife by telephone

## 2018-11-06 NOTE — Progress Notes (Signed)
Blood pressure increasing in the absence of patient's home antihypertensive medications. MD notified. Wendee Copp

## 2018-11-07 ENCOUNTER — Other Ambulatory Visit (HOSPITAL_COMMUNITY): Payer: BLUE CROSS/BLUE SHIELD

## 2018-11-07 ENCOUNTER — Encounter (HOSPITAL_COMMUNITY): Payer: Self-pay | Admitting: Anesthesiology

## 2018-11-07 ENCOUNTER — Telehealth: Payer: Self-pay | Admitting: *Deleted

## 2018-11-07 DIAGNOSIS — T451X5A Adverse effect of antineoplastic and immunosuppressive drugs, initial encounter: Secondary | ICD-10-CM

## 2018-11-07 DIAGNOSIS — B955 Unspecified streptococcus as the cause of diseases classified elsewhere: Secondary | ICD-10-CM

## 2018-11-07 DIAGNOSIS — K719 Toxic liver disease, unspecified: Secondary | ICD-10-CM

## 2018-11-07 LAB — CBC
HCT: 32 % — ABNORMAL LOW (ref 39.0–52.0)
Hemoglobin: 10.6 g/dL — ABNORMAL LOW (ref 13.0–17.0)
MCH: 27.6 pg (ref 26.0–34.0)
MCHC: 33.1 g/dL (ref 30.0–36.0)
MCV: 83.3 fL (ref 80.0–100.0)
Platelets: 93 10*3/uL — ABNORMAL LOW (ref 150–400)
RBC: 3.84 MIL/uL — ABNORMAL LOW (ref 4.22–5.81)
RDW: 14.8 % (ref 11.5–15.5)
WBC: 4.3 10*3/uL (ref 4.0–10.5)
nRBC: 0 % (ref 0.0–0.2)

## 2018-11-07 LAB — COMPREHENSIVE METABOLIC PANEL
ALT: 58 U/L — ABNORMAL HIGH (ref 0–44)
AST: 34 U/L (ref 15–41)
Albumin: 2.5 g/dL — ABNORMAL LOW (ref 3.5–5.0)
Alkaline Phosphatase: 473 U/L — ABNORMAL HIGH (ref 38–126)
Anion gap: 15 (ref 5–15)
BUN: 7 mg/dL — ABNORMAL LOW (ref 8–23)
CO2: 23 mmol/L (ref 22–32)
Calcium: 8.3 mg/dL — ABNORMAL LOW (ref 8.9–10.3)
Chloride: 96 mmol/L — ABNORMAL LOW (ref 98–111)
Creatinine, Ser: 0.7 mg/dL (ref 0.61–1.24)
GFR calc Af Amer: >60 mL/min (ref >60)
GFR calc non Af Amer: >60 mL/min (ref >60)
Glucose, Bld: 101 mg/dL — ABNORMAL HIGH (ref 70–99)
Potassium: 3.4 mmol/L — ABNORMAL LOW (ref 3.5–5.1)
Sodium: 134 mmol/L — ABNORMAL LOW (ref 135–145)
Total Bilirubin: 0.8 mg/dL (ref 0.3–1.2)
Total Protein: 5.2 g/dL — ABNORMAL LOW (ref 6.5–8.1)

## 2018-11-07 LAB — CULTURE, BLOOD (ROUTINE X 2): Special Requests: ADEQUATE

## 2018-11-07 MED ORDER — GABAPENTIN 100 MG PO CAPS
200.0000 mg | ORAL_CAPSULE | Freq: Two times a day (BID) | ORAL | Status: DC
  Administered 2018-11-07 – 2018-11-08 (×3): 200 mg via ORAL
  Filled 2018-11-07 (×3): qty 2

## 2018-11-07 MED ORDER — AMLODIPINE BESYLATE 10 MG PO TABS
10.0000 mg | ORAL_TABLET | Freq: Every day | ORAL | Status: DC
  Administered 2018-11-07: 10 mg via ORAL
  Filled 2018-11-07: qty 1

## 2018-11-07 NOTE — Discharge Summary (Addendum)
Name: Cory Kelly MRN: [id number] DOB: [birth year] 63 y.o. PCP: Mittie Bodo  Date of Admission: 11/04/2018  3:40 PM Date of Discharge: 11/08/2018 Attending Physician: Dr. Rebeca Alert  Discharge Diagnosis: 1. Streptococcus group C bacteremia with transient encephalopathy and transaminitis  2. Metastatic pancreatic adenocarcinoma  Discharge Medications: Allergies as of 11/08/2018   No Known Allergies     Medication List    STOP taking these medications   ciprofloxacin 500 MG tablet Commonly known as:  Cipro     TAKE these medications   acetaminophen 500 MG tablet Commonly known as:  TYLENOL Take 500-1,000 mg by mouth every 6 (six) hours as needed for moderate pain.   amLODipine 10 MG tablet Commonly known as:  NORVASC Take 10 mg by mouth at bedtime.   cefTRIAXone  IVPB Commonly known as:  ROCEPHIN Inject 2 g into the vein daily for 9 days. Indication:  Group C Strep Bacteremia Last Day of Therapy:  11/17/2018 Labs - Once weekly:  CBC/D and BMP, Labs - Every other week:  ESR and CRP   gabapentin 100 MG capsule Commonly known as:  Neurontin Take 2 capsules (200 mg total) by mouth 2 (two) times daily.   lidocaine-prilocaine cream Commonly known as:  EMLA Apply 1 application topically as needed. Apply to portacath site 1-2 hours prior to use What changed:    when to take this  reasons to take this  additional instructions   loperamide 2 MG tablet Commonly known as:  Imodium A-D Take 1 tablet (2 mg total) by mouth as directed. What changed:    how much to take  when to take this  reasons to take this   olmesartan 40 MG tablet Commonly known as:  Benicar Take 1 tablet (40 mg total) by mouth daily.   ondansetron 4 MG tablet Commonly known as:  ZOFRAN Take 1 tablet (4 mg total) by mouth every 6 (six) hours as needed for nausea.   oxyCODONE-acetaminophen 10-325 MG tablet Commonly known as:  Percocet Take 1 tablet by mouth every 4 (four) hours as  needed for pain.   potassium chloride SA 20 MEQ tablet Commonly known as:  K-DUR Take 1 tablet (20 mEq total) by mouth 2 (two) times daily.   PRAMOXINE-ZINC ACETATE EX Apply 1 application topically daily as needed (itching).            Home Infusion Instuctions  (From admission, onward)         Start     Ordered   11/08/18 0000  Home infusion instructions [facility] May follow [place] Dosing Protocol; May administer Cathflo as needed to maintain patency of vascular access device.; Flushing of vascular access device: per [facility] Protocol: 0.9% NaCl pre/post medica...    Question Answer Comment  Instructions May follow [place] Dosing Protocol   Instructions May administer Cathflo as needed to maintain patency of vascular access device.   Instructions Flushing of vascular access device: per [facility] Protocol: 0.9% NaCl pre/post medication administration and prn patency; Heparin 100 u/ml, 55m for implanted ports and Heparin 10u/ml, 566mfor all other central venous catheters.   Instructions May follow [facility] Anaphylaxis Protocol for First Dose Administration in the home: 0.9% NaCl at 25-50 ml/hr to maintain IV access for protocol meds. Epinephrine 0.3 ml IV/IM PRN and Benadryl 25-50 IV/IM PRN s/s of anaphylaxis.   Instructions [facility] Infusion Coordinator (RN) to assist per patient IV care needs in the home PRN.  11/08/18 1332          Disposition and follow-up:   [facility].  At the hospital follow up visit please address:  1. Streptococcus group C bacteremia, cholangitis - IV ceftriaxone via port-a-cath through 5/16 - Ensure patient has appropriate f/u with ID  Cerebral microhemorrhages - Please ensure patient has appropriate f/u with neurology for amyloid angiopathy vs familial cavernoma syndrome - Needs repeat brain MRI in one year to assess stability of microhemorrhage distribution   Metastatic pancreatic adenocarcinoma - Ensure patient has appropriate f/u with oncology  2.  Labs / imaging needed at time of follow-up: none  3.  Pending labs/ test needing follow-up: none  Follow-up Appointments:   Hospital Course by problem list: 1. Streptococcus group C bacteremia with transient encephalopathy and cholangitis: Cory Kelly is a 63 yo man with a medical history of metastatic pancreatic adenocarcinoma s/p indwelling biliary stent and currently undergoing chemotherapywho presented with fever to 103 and AMS.Blood cx grew Group C strep. Most likely source is cholangitis with elevated transaminases and bilirubin, but RUQ [place] without CBD dilation. No need for ERCP or stent adjustment. LFTs and bilirubin trended down during admission. Brain imaging negative for stroke, but showed chronic microhemorrhages possibly representing amyloid angiopathy, chronic HTN, multiple septic emboli, microbleeds from pancreatic cancer, or familial cerebral cavernous malformation. EEG negative for seizure activity. TTE and TEE negative for vegetations. Patient will f/u with neurology for amyloid angiopathy vs familial cavernoma syndrome and repeat MRI brain in 1 year w/o contrast to assess for stability of the microhemorrhage distribution. Patient clinically improved during hospitalization. Was afebrile for >48hrs and back to baseline mentation at time of discharge. Will continue IV ceftriaxone via his port-a-cath through 5/16.   2. Metastatic pancreatic adenocarcinoma: Metastatic to the liver. Currently undergoing chemotherapy with Dr. Benay Spice. Anemia and thrombocytopenia 2/2 chemotherapy, did not require transfusions during admission. Liver MRI showed stable pancreatic mass, mildly improved liver mets, and no new mets in the abdomen. He will follow with oncology as an outpatient.  Discharge Vitals:   BP (!) 152/62   Pulse (!) 54   Temp 98.3 F (36.8 C) (Oral)   Resp 14   Ht '6\' 6"'$  (1.981 m)   Wt  94.2 kg   SpO2 100%   BMI 24.00 kg/m   Pertinent Labs, Studies, and Procedures:  CBC Latest Ref Rng & Units 11/07/2018 11/06/2018 11/05/2018  WBC 4.0 - 10.5 K/uL 4.3 4.2 6.6  Hemoglobin 13.0 - 17.0 g/dL 10.6(L) 9.2(L) 8.8(L)  Hematocrit 39.0 - 52.0 % 32.0(L) 28.5(L) 27.4(L)  Platelets 150 - 400 K/uL 93(L) 75(L) PLATELET CLUMPS NOTED ON SMEAR, COUNT APPEARS DECREASED   CMP Latest Ref Rng & Units 11/07/2018 11/06/2018 11/05/2018  Glucose 70 - 99 mg/dL 101(H) 99 111(H)  BUN 8 - 23 mg/dL 7(L) 10 12  Creatinine 0.61 - 1.24 mg/dL 0.70 0.91 0.86  Sodium 135 - 145 mmol/L 134(L) 135 134(L)  Potassium 3.5 - 5.1 mmol/L 3.4(L) 3.5 3.7  Chloride 98 - 111 mmol/L 96(L) 101 103  CO2 22 - 32 mmol/L 23 24 21(L)  Calcium 8.9 - 10.3 mg/dL 8.3(L) 8.1(L) 7.8(L)  Total Protein 6.5 - 8.1 g/dL 5.2(L) 4.8(L) 4.7(L)  Total Bilirubin 0.3 - 1.2 mg/dL 0.8 1.0 1.2  Alkaline Phos 38 - 126 U/L 473(H) 429(H) 450(H)  AST 15 - 41 U/L 34 44(H) 75(H)  ALT 0 - 44 U/L 58(H) 67(H) 87(H)   CT Head 1.  Small age indeterminate infarct in the right superior frontal gyrus. 2. No intracranial hemorrhage. 3. Mild chronic small vessel ischemic disease.  CTA Head and Neck 1. No arch vessel occlusion. 2. Mild atherosclerosis in the head and neck without significant stenosis. 3.  Aortic Atherosclerosis (ICD10-I70.0).  RUQUS - Mild gallbladder wall thickening with pericholecystic fluid and sludge within the gallbladder. No tenderness over the gallbladder during the study or stones visualized. - Heterogeneous echotexture throughout the liver which may reflect focal/geographic fatty infiltration. - Prominent portal vein, 2.4 cm. Portal vein is patent with blood flow toward the liver.  MR Brain 1. No acute intracranial abnormality identified. 2. Mild-to-moderate chronic microvascular ischemic changes and mild volume loss of the brain. 3. Numerous foci of chronic microhemorrhage with a predominant peripheral supratentorial  distribution favoring sequelae of amyloid angiopathy over chronic hypertension. 4. Contrasted study without abnormal enhancement. No evidence of metastatic disease.  MR Liver - Stable pancreatic head soft tissue mass measuring approximately 3.5 cm. This mass involves and obstructs the portal venous confluence, and there is cavernous transformation of the portal vein. - Enlarged splenic vein, marked splenomegaly, and portosystemic venous collaterals, consistent with portal venous hypertension. - Diffuse hepatic metastases show mild improvement compared to prior exams. - No new or progressive metastatic disease within the abdomen.  TEE - Left Ventrical:  Normal LV function - Mitral Valve: mild MR ,  No vegetatin  - Aortic Valve: normal , 3 leaflet valve , no vegetation  - Tricuspid Valve: normal , no vegetation  - Pulmonic Valve: normal ,  Trivial PI, no vegetation  - Left Atrium/ Left atrial appendage: normal, no thrombus  - Atrial septum: intact by color flow,  - Aorta:  Mild calcification ,  No significant atheroma   Discharge Instructions: Discharge Instructions    Discharge instructions   Complete by:  As directed    It was a pleasure taking care of you during your hospital stay, Cory Kelly.  You were hospitalized for a bloodstream infection and will need to be on IV antibiotics through 5/16. You can continue your therapy at home through your port-a-cath. Home health has been arranged. Please see a doctor if you begin to experience fevers, chills, or confusion.   Please follow-up with Dr. Benay Spice for continued oncology care.   Thanks, Dr. Annie Paras   Home infusion instructions [place] May follow [place] Dosing Protocol; May administer Cathflo as needed to maintain patency of vascular access device.; Flushing of vascular access device: per [facility] Protocol: 0.9% NaCl pre/post medica...   Complete by:  As directed    Instructions:  May follow [place] Dosing  Protocol   Instructions:  May administer Cathflo as needed to maintain patency of vascular access device.   Instructions:  Flushing of vascular access device: per [facility] Protocol: 0.9% NaCl pre/post medication administration and prn patency; Heparin 100 u/ml, 20m for implanted ports and Heparin 10u/ml, 580mfor all other central venous catheters.   Instructions:  May follow [facility] Anaphylaxis Protocol for First Dose Administration in the home: 0.9% NaCl at 25-50 ml/hr to maintain IV access for protocol meds. Epinephrine 0.3 ml IV/IM PRN and Benadryl 25-50 IV/IM PRN s/s of anaphylaxis.   Instructions:  [facility] Coord[facility] (RN) to assist per patient IV care needs in the home PRN.   Increase activity slowly   Complete by:  As directed       Signed: Dorrell, DeAndree ElkMD 11/08/2018, 1:34 PM   Pager: [phone]  Internal Medicine Attending Note:  I saw and examined the patient on the day of discharge. I reviewed and agree with the discharge summary written by the house staff.  Lenice Pressman, M.D., Ph.D.

## 2018-11-07 NOTE — Progress Notes (Addendum)
   Subjective: No overnight events. Cory Kelly feels well today and has no concerns. He was seen ambulating around his room without issue. He would like to be discharged as soon as he can and is willing to get abx infusions at the cancer center if necessary. 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 All questions were addressed. I spoke with his wife and updated her on the care plan.   Objective:  Vital signs in last 24 hours: Vitals:   11/06/18 1100 11/06/18 1943 11/07/18 0020 11/07/18 0321  BP: (!) 162/93 (!) 150/100 (!) 145/89 (!) 145/100  Pulse: 61 (!) 57 (!) 56 (!) 57  Resp: '16 17 17 17  '$ Temp: 97.8 F (36.6 C) 98.8 F (37.1 C) 98.1 F (36.7 C) 98.4 F (36.9 C)  TempSrc: Oral Oral Oral Oral  SpO2: 99% 98% 99% 96%  Weight:      Height:       Gen: alert and oriented x3, no distress CV: RRR, no murmurs Pulm: CTAB, normal effort on room air Abd: bowel sounds present, soft, non-distended, non-tender Ext: no edema  Assessment/Plan:  Principal Problem:   Fever, unspecified Active Problems:   Elevated LFTs   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HCC)   Port-A-Cath in place   Episode of transient neurologic symptoms  Mr. Correll is a 63 yo man with a medical history of metastatic pancreatic adenocarcinoma s/p indwelling biliary stent and currently undergoing chemotherapy, neuropathy, andHTNwho presented with fever to 103 and AMS.  Streptococcus Group C Bacteremia Transient Encephalopathy Elevated LFTs and hyperbilirubinemia -Currently afebrile, hemodynamically stable, and at baseline mentation. Leukocytosis resolved. Blood cx from 5/2 blood cx grew Group C Strep, susceptibilities pending. Blood cx from 5/3 are negative to date. Urine cx negative. - Th source is likely cholangitis given history of biliary stent and transaminitis on admission. LFTs and bilirubin have continued to  trend down. Alk phos remains elevated and this appears chronic. No indication for ERCP per GI.  - Case discussed with ID who recommended treating with IV ceftriaxone through 5/16 and maintaining port-a-cath. If TEE is negative, he can be set up for OPAT.  Plan - Neurology following, appreciate recs  - ID consulted, appreciate recs  - Abx narrowed to ceftriaxone (day 4 of abx total) - TEE today  Metastatic pancreatic adenocarcinoma:Follows with Dr. Benay Spice. Last chemo 3 weeks ago. 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 He is not neutropenic.Liver MRI from yesterday showed persistent pancreatic mass, mild improvement of diffuse hepatic metastases, and no new or progressive metastatic disease. - Resume home gabapentin  - Continue home imodium prn  - Continue home zofran prn  - Continue home percocet 10-'325mg'$  for abdominal pain  [id number] gradually hypertensive yesterday, resumed home ARB. BP still elevated this am at 145/100. Home regimen includes olmesartan '40mg'$  daily and amlodipine '10mg'$  daily.  - Continue irbesartan and resume home amlodipine today  Dispo: Anticipated discharge in approximately 0-1 days pending TEE results.  , Andree Elk, MD 11/07/2018, 6:20 AM Pager: [phone]

## 2018-11-07 NOTE — Progress Notes (Signed)
[facility] for Infectious Disease   Reason for visit: Follow up on Strep bacteremia  Interval History: no new positive cultures; no fever, wbc wnl; no associated rash or diarrhea.  Asking about liver MRI    Physical Exam: Constitutional:  Vitals:   11/07/18 0321 11/07/18 0853  BP: (!) 145/100 (!) 141/89  Pulse: (!) 57 (!) 54  Resp: 17 15  Temp: 98.4 F (36.9 C) 97.7 F (36.5 C)  SpO2: 96% 98%   patient appears in NAD, up in chair Eyes: anicteric Respiratory: Normal respiratory effort; CTA B Cardiovascular: RRR GI: soft, nt, nd  Review of Systems: Constitutional: negative for fevers and chills Integument/breast: negative for rash Musculoskeletal: negative for myalgias and arthralgias  Lab Results  Component Value Date   WBC 4.3 11/07/2018   HGB 10.6 (L) 11/07/2018   HCT 32.0 (L) 11/07/2018   MCV 83.3 11/07/2018   PLT 93 (L) 11/07/2018    Lab Results  Component Value Date   CREATININE 0.70 11/07/2018   BUN 7 (L) 11/07/2018   NA 134 (L) 11/07/2018   K 3.4 (L) 11/07/2018   CL 96 (L) 11/07/2018   CO2 23 11/07/2018    Lab Results  Component Value Date   ALT 58 (H) 11/07/2018   AST 34 11/07/2018   ALKPHOS 473 (H) 11/07/2018     Microbiology: Recent Results (from the past 240 hour(s))  Blood Culture (routine x 2)     Status: Abnormal   Collection Time: 11/03/18  9:19 PM  Result Value Ref Range Status   Specimen Description   Final    BLOOD BLOOD RIGHT FOREARM Performed at [facility], [place] [address] [place], [place] [address]    Special Requests   Final    BOTTLES DRAWN AEROBIC AND ANAEROBIC Blood Culture adequate volume Performed at [facility] [address], [place], [place] [address]    Culture  Setup Time   Final    GRAM POSITIVE COCCI IN CHAINS ANAEROBIC BOTTLE ONLY CRITICAL RESULT CALLED TO, READ BACK BY AND VERIFIED WITH: FANNING R PHARMD AT 1400 ON [id number] BY SJW Performed at [facility] [place], [place] [address], [place], [place] [address]    Culture STREPTOCOCCUS GROUP C (A)  Final   Report Status 11/07/2018 FINAL  Final   Organism ID, Bacteria STREPTOCOCCUS GROUP C  Final      Susceptibility   Streptococcus group c - MIC*    PENICILLIN INTERMEDIATE Intermediate     ERYTHROMYCIN <=0.12 SENSITIVE Sensitive     LEVOFLOXACIN <=0.25 SENSITIVE Sensitive     VANCOMYCIN 0.5 SENSITIVE Sensitive     * STREPTOCOCCUS GROUP C  SARS Coronavirus 2 [facility] order, Performed in [place] hospital lab)     Status: None   Collection Time: 11/03/18  9:19 PM  Result Value Ref Range Status   SARS Coronavirus 2 NEGATIVE NEGATIVE Final    Comment: (NOTE) If result is NEGATIVE SARS-CoV-2 target nucleic acids are NOT DETECTED. The SARS-CoV-2 RNA is generally detectable in upper and lower  respiratory specimens during the acute phase of infection. The lowest  concentration of SARS-CoV-2 viral copies this assay can detect is 250  copies / mL. A negative result does not preclude SARS-CoV-2 infection  and should not be used as the sole basis for treatment or other  patient management decisions.  A negative result may occur with  improper specimen collection / handling, submission of specimen other  than nasopharyngeal swab,  presence of viral mutation(s) within the  areas targeted by this assay, and inadequate number of viral copies  (<250 copies / mL). A negative result must be combined with clinical  observations, patient history, and epidemiological information. If result is POSITIVE SARS-CoV-2 target nucleic acids are DETECTED. The SARS-CoV-2 RNA is generally detectable in upper and lower  respiratory specimens dur ing the acute phase of infection.  Positive  results are indicative of active infection with SARS-CoV-2.  Clinical  correlation with patient history and other diagnostic information is  necessary to determine patient infection status.  Positive results do  not rule out bacterial  infection or co-infection with other viruses. If result is PRESUMPTIVE POSTIVE SARS-CoV-2 nucleic acids MAY BE PRESENT.   A presumptive positive result was obtained on the submitted specimen  and confirmed on repeat testing.  While 2019 novel coronavirus  (SARS-CoV-2) nucleic acids may be present in the submitted sample  additional confirmatory testing may be necessary for epidemiological  and / or clinical management purposes  to differentiate between  SARS-CoV-2 and other Sarbecovirus currently known to infect humans.  If clinically indicated additional testing with an alternate test  methodology [phone]) is advised. The SARS-CoV-2 RNA is generally  detectable in upper and lower respiratory sp ecimens during the acute  phase of infection. The expected result is Negative. Fact Sheet for Patients:  StrictlyIdeas.no Fact Sheet for Healthcare Providers: BankingDealers.co.za This test is not yet approved or cleared by the [place] FDA and has been authorized for detection and/or diagnosis of SARS-CoV-2 by FDA under an Emergency Use Authorization (EUA).  This EUA will remain in effect (meaning this test can be used) for the duration of the COVID-19 declaration under Section 564(b)(1) of the Act, 21 U.S.C. section 360bbb-3(b)(1), unless the authorization is terminated or revoked sooner. Performed at [facility], [place] [address] [place],  [address]   Blood Culture ID Panel (Reflexed)     Status: Abnormal   Collection Time: 11/03/18  9:19 PM  Result Value Ref Range Status   Enterococcus species NOT DETECTED NOT DETECTED Final   Listeria monocytogenes NOT DETECTED NOT DETECTED Final   Staphylococcus species NOT DETECTED NOT DETECTED Final   Staphylococcus aureus (BCID) NOT DETECTED NOT DETECTED Final   Streptococcus species DETECTED (A) NOT DETECTED Final    Comment: Not Enterococcus species, Streptococcus  agalactiae, Streptococcus pyogenes, or Streptococcus pneumoniae. CRITICAL RESULT CALLED TO, READ BACK BY AND VERIFIED WITH: FANNING R PHARMD AT 1200 ON [id number] BY SJW    Streptococcus agalactiae NOT DETECTED NOT DETECTED Final   Streptococcus pneumoniae NOT DETECTED NOT DETECTED Final   Streptococcus pyogenes NOT DETECTED NOT DETECTED Final   Acinetobacter baumannii NOT DETECTED NOT DETECTED Final   Enterobacteriaceae species NOT DETECTED NOT DETECTED Final   Enterobacter cloacae complex NOT DETECTED NOT DETECTED Final   Escherichia coli NOT DETECTED NOT DETECTED Final   Klebsiella oxytoca NOT DETECTED NOT DETECTED Final   Klebsiella pneumoniae NOT DETECTED NOT DETECTED Final   Proteus species NOT DETECTED NOT DETECTED Final   Serratia marcescens NOT DETECTED NOT DETECTED Final   Haemophilus influenzae NOT DETECTED NOT DETECTED Final   Neisseria meningitidis NOT DETECTED NOT DETECTED Final   Pseudomonas aeruginosa NOT DETECTED NOT DETECTED Final   Candida albicans NOT DETECTED NOT DETECTED Final   Candida glabrata NOT DETECTED NOT DETECTED Final   Candida krusei NOT DETECTED NOT DETECTED Final   Candida parapsilosis NOT DETECTED NOT DETECTED Final   Candida tropicalis NOT  DETECTED NOT DETECTED Final    Comment: Performed at [facility], [place] [address], [place], [place] [address]  Blood Culture (routine x 2)     Status: None (Preliminary result)   Collection Time: 11/03/18  9:20 PM  Result Value Ref Range Status   Specimen Description   Final    BLOOD PORTA CATH Performed at [facility] [address], [place], [place] [address]    Special Requests   Final    BOTTLES DRAWN AEROBIC AND ANAEROBIC Blood Culture results may not be optimal due to an excessive volume of blood received in culture bottles Performed at [place] [address] [place], [place] [address]    Culture   Final    NO GROWTH 3 DAYS Performed at [facility], [place] [address], [place], [place] [address]    Report Status PENDING  Incomplete  Urine culture     Status: None   Collection Time: 11/04/18  4:13 PM  Result Value Ref Range Status   Specimen Description URINE, RANDOM  Final   Special Requests NONE  Final   Culture   Final    NO GROWTH Performed at [facility], [address], [place], [place] [address]    Report Status 11/05/2018 FINAL  Final  SARS Coronavirus 2 (CEPHEID - Performed in [facility] hospital lab), Hosp Order     Status: None   Collection Time: 11/04/18  4:46 PM  Result Value Ref Range Status   SARS Coronavirus 2 NEGATIVE NEGATIVE Final    Comment: (NOTE) If result is NEGATIVE SARS-CoV-2 target nucleic acids are NOT DETECTED. The SARS-CoV-2 RNA is generally detectable in upper and lower  respiratory specimens during the acute phase of infection. The lowest  concentration of SARS-CoV-2 viral copies this assay can detect is 250  copies / mL. A negative result does not preclude SARS-CoV-2 infection  and should not be used as the sole basis for treatment or other  patient management decisions.  A negative result may occur with  improper specimen collection / handling, submission of specimen other  than nasopharyngeal swab, presence of viral mutation(s) within the  areas targeted by this assay, and inadequate number of viral copies  (<250 copies / mL). A negative result must be combined with clinical  observations, patient history, and epidemiological information. If result is POSITIVE SARS-CoV-2 target nucleic acids are DETECTED. The SARS-CoV-2 RNA is generally detectable in upper and lower  respiratory specimens dur ing the acute phase of infection.  Positive  results are indicative of active infection with SARS-CoV-2.  Clinical  correlation with patient history and other diagnostic information is  necessary to determine patient infection status.  Positive results do  not rule out bacterial infection  or co-infection with other viruses. If result is PRESUMPTIVE POSTIVE SARS-CoV-2 nucleic acids MAY BE PRESENT.   A presumptive positive result was obtained on the submitted specimen  and confirmed on repeat testing.  While 2019 novel coronavirus  (SARS-CoV-2) nucleic acids may be present in the submitted sample  additional confirmatory testing may be necessary for epidemiological  and / or clinical management purposes  to differentiate between  SARS-CoV-2 and other Sarbecovirus currently known to infect humans.  If clinically indicated additional testing with an alternate test  methodology [phone]) is advised. The SARS-CoV-2 RNA is generally  detectable in upper and lower respiratory sp ecimens during the acute  phase of infection. The expected result is  Negative. Fact Sheet for Patients:  StrictlyIdeas.no Fact Sheet for Healthcare Providers: BankingDealers.co.za This test is not yet approved or cleared by the [place] FDA and has been authorized for detection and/or diagnosis of SARS-CoV-2 by FDA under an Emergency Use Authorization (EUA).  This EUA will remain in effect (meaning this test can be used) for the duration of the COVID-19 declaration under Section 564(b)(1) of the Act, 21 U.S.C. section 360bbb-3(b)(1), unless the authorization is terminated or revoked sooner. Performed at [facility], [place] [address], [place], [place] [address]   Culture, blood (routine x 2)     Status: None (Preliminary result)   Collection Time: 11/04/18  5:15 PM  Result Value Ref Range Status   Specimen Description BLOOD RIGHT WRIST  Final   Special Requests AEROBIC BOTTLE ONLY Blood Culture adequate volume  Final   Culture   Final    NO GROWTH 3 DAYS Performed at [facility], [place] [address], [place], [place] [address]    Report Status PENDING  Incomplete  Culture, blood (routine x 2)     Status: None (Preliminary result)    Collection Time: 11/04/18  5:17 PM  Result Value Ref Range Status   Specimen Description BLOOD RIGHT ANTECUBITAL  Final   Special Requests   Final    BOTTLES DRAWN AEROBIC AND ANAEROBIC Blood Culture adequate volume   Culture   Final    NO GROWTH 3 DAYS Performed at [facility], [place] [address], [place], [place] [address]    Report Status PENDING  Incomplete    Impression/Plan:  1. Bacteremia - TEE today.  If negative, would continue with IV ceftriaxone through 5/16.  If vegetation noted, this will change management.   If TEE negative, ok from ID standpoint for discharge. No absolute indication for port removal with 1/6 positive blood cultures with Strep.    2.  Medication monitoring - no leukopenia.    3.  dispo - will do opat consult after TEE today.

## 2018-11-07 NOTE — Telephone Encounter (Signed)
"  Cory Kelly's wife [name] [phone]).  Dr. Benay Spice saw him this morning.  I would like a call from Dr.Sherrill.  [name] is not medical; we don't understand what is going on. Why do they want to do endoscopy when Dr. Benay Spice doesn't want t? Why is he being seen by infectious disease?"  [name] crying during conversation.

## 2018-11-07 NOTE — TOC Progression Note (Signed)
Transition of Care [facility]) - Progression Note    Patient Details  Name: Rashaad [place]strom MRN: [id number] Date of Birth: 1956/06/29  Transition of Care [facility]) CM/SW Contact  Pollie Friar, RN Phone Number: 11/07/2018, 2:14 PM  Clinical Narrative:    Plan is for patient to d/c home with [facility] services for IV abx. CM provided him choice and he asked that his wife be notified. CM called his wife and [facility] selected.  Pt has a PAC so additional IV access not needed.  Pt will have a transportation home.    Expected Discharge Plan: Home/Self Care Barriers to Discharge: Continued Medical Work up  Expected Discharge Plan and Services Expected Discharge Plan: Home/Self Care     Post Acute Care Choice: [facility] arrangements for the past 2 months: [place]: RN [place] Agency: [place] ([place]) Date [place]: 11/07/18 Time [place]: [id number] Representative spoke with at [place]: [place] ([place]) Interventions    Readmission Risk Interventions No flowsheet data found.

## 2018-11-07 NOTE — Progress Notes (Signed)
HEMATOLOGY-ONCOLOGY PROGRESS NOTE  SUBJECTIVE: He feels well, no complaint.  He wants to go home.    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[place])    08/31/2017 - 11/22/2017 Chemotherapy    The patient had palonosetron (ALOXI) injection 0.25 mg, 0.25 mg, Intravenous,  Once, 6 of 6 cycles Administration: 0.25 mg (08/31/2017), 0.25 mg (09/14/2017), 0.25 mg (09/28/2017), 0.25 mg (10/12/2017), 0.25 mg (10/26/2017), 0.25 mg (11/09/2017) pegfilgrastim-cbqv (UDENYCA) injection 6 mg, 6 mg, Subcutaneous, Once, 6 of 6 cycles Administration: 6 mg (09/02/2017), 6 mg (09/16/2017), 6 mg (09/30/2017), 6 mg (10/14/2017), 6 mg (10/28/2017), 6 mg (11/11/2017) irinotecan (CAMPTOSAR) 360 mg in sodium chloride 0.9 % 500 mL chemo infusion, 150 mg/m2 = 360 mg (100 % of original dose 150 mg/m2), Intravenous,  Once, 6 of 6 cycles Dose modification: 150 mg/m2 (original dose 150 mg/m2, Cycle 1, Reason: Provider Judgment) Administration: 360 mg (08/31/2017), 360 mg (09/14/2017), 360 mg (09/28/2017), 340 mg (10/12/2017), 340 mg (10/26/2017), 340 mg (11/09/2017) leucovorin 952 mg in sodium chloride 0.9 % 250 mL infusion, 400 mg/m2 = 952 mg, Intravenous,  Once, 6 of 6 cycles Administration: 952 mg (08/31/2017), 952 mg (09/14/2017), 952 mg (09/28/2017), 888 mg (10/12/2017), 888 mg (10/26/2017), 888 mg (11/09/2017) oxaliplatin (ELOXATIN) 200 mg in dextrose 5 % 500 mL chemo infusion, 85 mg/m2 = 200 mg, Intravenous,  Once, 6 of 6 cycles Dose modification: 65 mg/m2 (original dose 85 mg/m2, Cycle 4, Reason: Provider Judgment) Administration: 200 mg (08/31/2017), 200 mg (09/14/2017), 200 mg (09/28/2017), 145 mg (10/12/2017), 150 mg (10/26/2017), 150 mg (11/09/2017) fosaprepitant (EMEND) 150 mg, dexamethasone (DECADRON) 12 mg in sodium chloride 0.9 % 145 mL IVPB, , Intravenous,  Once, 6 of 6 cycles Administration:  (08/31/2017),  (09/14/2017),  (09/28/2017),  (10/12/2017),  (10/26/2017),   (11/09/2017) fluorouracil (ADRUCIL) 5,700 mg in sodium chloride 0.9 % 136 mL chemo infusion, 2,400 mg/m2 = 5,700 mg, Intravenous, 1 Day/Dose, 6 of 6 cycles Administration: 5,700 mg (08/31/2017), 5,700 mg (09/14/2017), 5,700 mg (09/28/2017), 5,350 mg (10/12/2017), 5,350 mg (10/26/2017), 5,350 mg (11/09/2017)  for chemotherapy treatment.     12/06/2017 - 02/28/2018 Chemotherapy    The patient had gemcitabine (GEMZAR) 1,786 mg in sodium chloride 0.9 % 250 mL chemo infusion, 800 mg/m2 = 1,786 mg (80 % of original dose 1,000 mg/m2), Intravenous,  Once, 4 of 4 cycles Dose modification: 800 mg/m2 (original dose 1,000 mg/m2, Cycle 1, Reason: Provider Judgment), 800 mg/m2 (original dose 1,000 mg/m2, Cycle 1, Reason: Provider Judgment, Comment: call from MD to decrease dose due to counts) Administration: 1,786 mg (12/20/2017), 1,786 mg (12/06/2017), 1,786 mg (01/03/2018), 1,786 mg (01/17/2018), 1,786 mg (01/31/2018), 1,786 mg (02/14/2018), 1,786 mg (02/28/2018) PACLitaxel-protein bound (ABRAXANE) chemo infusion 225 mg, 100 mg/m2 = 225 mg (80 % of original dose 125 mg/m2), Intravenous, Once, 3 of 3 cycles Dose modification: 100 mg/m2 (original dose 125 mg/m2, Cycle 1, Reason: Provider Judgment), 100 mg/m2 (original dose 125 mg/m2, Cycle 1, Reason: Provider Judgment, Comment: call from MD to decrease dose based on counts) Administration: 225 mg (12/20/2017), 225 mg (12/06/2017), 225 mg (01/03/2018), 225 mg (01/17/2018)  for chemotherapy treatment.     07/04/2018 Cancer Staging    Staging form: Exocrine Pancreas, AJCC 8th Edition - Pathologic: Stage IV (cM1) - Signed by Ladell Pier, MD on 07/04/2018    08/21/2018 -  Chemotherapy    The patient had palonosetron (ALOXI) injection 0.25 mg, 0.25 mg, Intravenous,  Once, 5  of 6 cycles Administration: 0.25 mg (08/21/2018), 0.25 mg (09/03/2018), 0.25 mg (09/18/2018), 0.25 mg (10/01/2018), 0.25 mg (10/16/2018) pegfilgrastim-cbqv (UDENYCA) injection 6 mg, 6 mg, Subcutaneous, Once, 5 of 6  cycles Administration: 6 mg (08/23/2018), 6 mg (09/05/2018), 6 mg (09/20/2018), 6 mg (10/03/2018), 6 mg (10/18/2018) irinotecan (CAMPTOSAR) 360 mg in dextrose 5 % 500 mL chemo infusion, 150 mg/m2 = 360 mg (100 % of original dose 150 mg/m2), Intravenous,  Once, 5 of 6 cycles Dose modification: 150 mg/m2 (original dose 150 mg/m2, Cycle 1, Reason: Provider Judgment), 112.5 mg/m2 (original dose 150 mg/m2, Cycle 1, Reason: Change in LFTs) Administration: 260 mg (08/21/2018), 260 mg (09/03/2018), 260 mg (09/18/2018), 260 mg (10/01/2018), 200 mg (10/16/2018) leucovorin 952 mg in dextrose 5 % 250 mL infusion, 400 mg/m2 = 952 mg, Intravenous,  Once, 5 of 6 cycles Dose modification: 300 mg/m2 (original dose 400 mg/m2, Cycle 1, Reason: Provider Judgment) Administration: 696 mg (08/21/2018), 696 mg (09/03/2018), 696 mg (09/18/2018), 676 mg (10/01/2018), 676 mg (10/16/2018) oxaliplatin (ELOXATIN) 155 mg in dextrose 5 % 500 mL chemo infusion, 65 mg/m2 = 155 mg (100 % of original dose 65 mg/m2), Intravenous,  Once, 5 of 6 cycles Dose modification: 65 mg/m2 (original dose 65 mg/m2, Cycle 1, Reason: Provider Judgment) Administration: 150 mg (08/21/2018), 150 mg (09/03/2018), 150 mg (09/18/2018), 150 mg (10/01/2018), 150 mg (10/16/2018) fosaprepitant (EMEND) 150 mg, dexamethasone (DECADRON) 12 mg in sodium chloride 0.9 % 145 mL IVPB, , Intravenous,  Once, 5 of 6 cycles Administration:  (08/21/2018),  (09/03/2018),  (09/18/2018),  (10/01/2018),  (10/16/2018) fluorouracil (ADRUCIL) 5,700 mg in sodium chloride 0.9 % 136 mL chemo infusion, 2,400 mg/m2 = 5,700 mg, Intravenous, 1 Day/Dose, 5 of 6 cycles Dose modification: 1,800 mg/m2 (original dose 2,400 mg/m2, Cycle 1, Reason: Change in LFTs) Administration: 4,200 mg (08/21/2018), 4,200 mg (09/03/2018), 4,200 mg (09/18/2018), 4,050 mg (10/01/2018), 4,050 mg (10/16/2018)  for chemotherapy treatment.        I have reviewed the past medical history, past surgical history, social history and family history  with the patient and they are unchanged from previous note.   PHYSICAL EXAMINATION:  Vitals:   11/07/18 0321 11/07/18 0853  BP: (!) 145/100 (!) 141/89  Pulse: (!) 57 (!) 54  Resp: 17 15  Temp: 98.4 F (36.9 C) 97.7 F (36.5 C)  SpO2: 96% 98%   Filed Weights   11/05/18 0151  Weight: 207 lb 10.8 oz (94.2 kg)    Intake/Output from previous day: 05/05 0701 - 05/06 0700 In: 220 [P.O.:120; IV Piggyback:100] Out: -   Port-A-Cath-no erythema or tenderness Abdomen- no hepatosplenomegaly, nontender  LABORATORY DATA:  I have reviewed the data as listed CMP Latest Ref Rng & Units 11/07/2018 11/06/2018 11/05/2018  Glucose 70 - 99 mg/dL 101(H) 99 111(H)  BUN 8 - 23 mg/dL 7(L) 10 12  Creatinine 0.61 - 1.24 mg/dL 0.70 0.91 0.86  Sodium 135 - 145 mmol/L 134(L) 135 134(L)  Potassium 3.5 - 5.1 mmol/L 3.4(L) 3.5 3.7  Chloride 98 - 111 mmol/L 96(L) 101 103  CO2 22 - 32 mmol/L 23 24 21(L)  Calcium 8.9 - 10.3 mg/dL 8.3(L) 8.1(L) 7.8(L)  Total Protein 6.5 - 8.1 g/dL 5.2(L) 4.8(L) 4.7(L)  Total Bilirubin 0.3 - 1.2 mg/dL 0.8 1.0 1.2  Alkaline Phos 38 - 126 U/L 473(H) 429(H) 450(H)  AST 15 - 41 U/L 34 44(H) 75(H)  ALT 0 - 44 U/L 58(H) 67(H) 87(H)    Lab Results  Component Value Date   WBC 4.3 11/07/2018  HGB 10.6 (L) 11/07/2018   HCT 32.0 (L) 11/07/2018   MCV 83.3 11/07/2018   PLT 93 (L) 11/07/2018   NEUTROABS 12.1 (H) 11/04/2018    Ct Angio Head W Or Wo Contrast  Result Date: 11/04/2018 CLINICAL DATA:  Aphasia.  Decreased responsiveness. EXAM: CT ANGIOGRAPHY HEAD AND NECK TECHNIQUE: Multidetector CT imaging of the head and neck was performed using the standard protocol during bolus administration of intravenous contrast. Multiplanar CT image reconstructions and MIPs were obtained to evaluate the vascular anatomy. Carotid stenosis measurements (when applicable) are obtained utilizing NASCET criteria, using the distal internal carotid diameter as the denominator. CONTRAST:  70m OMNIPAQUE  IOHEXOL 350 MG/ML SOLN COMPARISON:  None. FINDINGS: CTA NECK FINDINGS Aortic arch: Standard 3 vessel aortic arch with mild atherosclerotic plaque. Widely patent arch vessel origins. Right carotid system: Patent with mild plaque about the carotid bifurcation. No evidence of significant stenosis or dissection. Left carotid system: Patent with mild plaque about the carotid bifurcation. No evidence of significant stenosis or dissection. Vertebral arteries: Patent without evidence of significant stenosis or dissection. Nonstenotic calcified plaque at the right vertebral artery origin. Mildly dominant left vertebral artery. Skeleton: Mild cervical spondylosis. Other neck: No neck mass or enlarged lymph nodes identified. Upper chest: Clear lung apices. Review of the MIP images confirms the above findings CTA HEAD FINDINGS Anterior circulation: The internal carotid arteries are patent from skull base to carotid termini with mild nonstenotic plaque bilaterally. ACAs and MCAs are patent with branch vessel irregularity but no evidence of proximal branch occlusion or significant proximal stenosis. No aneurysm is identified. Posterior circulation: The intracranial vertebral arteries are patent to the basilar. Patent left PICA and bilateral SCAs are visualized. AICAs and a right PICA are not clearly identified. The basilar artery is widely patent. Posterior communicating arteries are not clearly identified and may be diminutive or absent. PCAs are patent without evidence of significant proximal stenosis. No aneurysm is identified. Venous sinuses: Patent. Anatomic variants: None. Review of the MIP images confirms the above findings IMPRESSION: 1. No arch vessel occlusion. 2. Mild atherosclerosis in the head and neck without significant stenosis. 3.  Aortic Atherosclerosis (ICD10-I70.0). These results were communicated to Dr. ALorraine Laxat 4:19 pm on 11/04/2018 by text page via the [facility]aging system. Electronically Signed   By: ALogan BoresM.D.   On: 11/04/2018 16:33   Ct Angio Neck W Or Wo Contrast  Result Date: 11/04/2018 CLINICAL DATA:  Aphasia.  Decreased responsiveness. EXAM: CT ANGIOGRAPHY HEAD AND NECK TECHNIQUE: Multidetector CT imaging of the head and neck was performed using the standard protocol during bolus administration of intravenous contrast. Multiplanar CT image reconstructions and MIPs were obtained to evaluate the vascular anatomy. Carotid stenosis measurements (when applicable) are obtained utilizing NASCET criteria, using the distal internal carotid diameter as the denominator. CONTRAST:  745mOMNIPAQUE IOHEXOL 350 MG/ML SOLN COMPARISON:  None. FINDINGS: CTA NECK FINDINGS Aortic arch: Standard 3 vessel aortic arch with mild atherosclerotic plaque. Widely patent arch vessel origins. Right carotid system: Patent with mild plaque about the carotid bifurcation. No evidence of significant stenosis or dissection. Left carotid system: Patent with mild plaque about the carotid bifurcation. No evidence of significant stenosis or dissection. Vertebral arteries: Patent without evidence of significant stenosis or dissection. Nonstenotic calcified plaque at the right vertebral artery origin. Mildly dominant left vertebral artery. Skeleton: Mild cervical spondylosis. Other neck: No neck mass or enlarged lymph nodes identified. Upper chest: Clear lung apices. Review of the MIP images confirms  the above findings CTA HEAD FINDINGS Anterior circulation: The internal carotid arteries are patent from skull base to carotid termini with mild nonstenotic plaque bilaterally. ACAs and MCAs are patent with branch vessel irregularity but no evidence of proximal branch occlusion or significant proximal stenosis. No aneurysm is identified. Posterior circulation: The intracranial vertebral arteries are patent to the basilar. Patent left PICA and bilateral SCAs are visualized. AICAs and a right PICA are not clearly identified. The basilar artery is  widely patent. Posterior communicating arteries are not clearly identified and may be diminutive or absent. PCAs are patent without evidence of significant proximal stenosis. No aneurysm is identified. Venous sinuses: Patent. Anatomic variants: None. Review of the MIP images confirms the above findings IMPRESSION: 1. No arch vessel occlusion. 2. Mild atherosclerosis in the head and neck without significant stenosis. 3.  Aortic Atherosclerosis (ICD10-I70.0). These results were communicated to Dr. Lorraine Lax at 4:19 pm on 11/04/2018 by text page via the [facility] messaging system. Electronically Signed   By: Logan Bores M.D.   On: 11/04/2018 16:33   Mr Brain Wo Contrast  Result Date: 11/05/2018 CLINICAL DATA:  63 y/o  M; episode of confusion and aphasia. EXAM: MRI HEAD WITHOUT CONTRAST TECHNIQUE: Multiplanar, multiecho pulse sequences of the brain and surrounding structures were obtained without intravenous contrast. COMPARISON:  11/04/2018 CT head and CTA head. FINDINGS: Brain: No acute infarction, hemorrhage, hydrocephalus, extra-axial collection or mass lesion. Punctate and early confluent nonspecific T2 FLAIR hyperintensities in subcortical and periventricular white matter are compatible with mild to moderate chronic microvascular ischemic changes. Mild volume loss of the brain. Numerous punctate and small foci of susceptibility hypointensity are present throughout the brain in a predominantly peripheral distribution compatible with chronic microhemorrhage. Vascular: Normal flow voids. Skull and upper cervical spine: Normal marrow signal. Sinuses/Orbits: Negative. Other: None. IMPRESSION: 1. No acute intracranial abnormality identified. 2. Mild-to-moderate chronic microvascular ischemic changes and mild volume loss of the brain. 3. 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 Electronically Signed    By: Kristine Garbe M.D.   On: 11/05/2018 01:39   Mr Brain W Contrast  Result Date: 11/05/2018 CLINICAL DATA:  Follow-up intracranial hemorrhage. EXAM: MRI HEAD WITH CONTRAST TECHNIQUE: Multiplanar, multiecho pulse sequences of the brain and surrounding structures were obtained with intravenous contrast. CONTRAST:  9 cc Gadavist intravenous COMPARISON:  Noncontrast brain MRI from earlier today FINDINGS: Brain: No abnormal intracranial enhancement to suggest metastatic disease or inflammation. The patient has numerous foci of susceptibility artifact on prior gradient imaging that are not apparent on postcontrast T1. Vascular: Major vessels are enhancing. Skull and upper cervical spine: Negative for marrow lesion Sinuses/Orbits: Negative IMPRESSION: No abnormal enhancement.  No evidence of metastatic disease. Electronically Signed   By: Monte Fantasia M.D.   On: 11/05/2018 11:51   Ct Abdomen Pelvis W Contrast  Result Date: 10/31/2018 CLINICAL DATA:  Pancreatic carcinoma. Liver metastasis. Esophageal chemotherapy ongoing. Five cycles. EXAM: CT ABDOMEN AND PELVIS WITH CONTRAST TECHNIQUE: Multidetector CT imaging of the abdomen and pelvis was performed using the standard protocol following bolus administration of intravenous contrast. CONTRAST:  1104m ISOVUE-300 IOPAMIDOL (ISOVUE-300) INJECTION 61% COMPARISON:  MRI 08/08/2018, 08/03/2017 FINDINGS: Lower chest:  Lung bases are clear Hepatobiliary: Differential perfusion with hypoperfusion of the LEFT hepatic lobe and normal perfusion of the RIGHT hepatic lobe. Common bile duct stent in place. Expected pneumobilia in the nondependent liver. No evidence of biliary duct dilatation. The hepatic metastasis  identified on comparison MRI are not well appreciated by CT imaging. Some lesions along the gallbladder fossa are noted presumed metastasis (image 32/2). The gallbladder wall is thickened which is increased from comparison exam. Pancreas: Mass in the head of  the pancreas/uncinate measures 3.8 by 2.8 cm and difficult to measure mass on comparison MRI but the uncinate mass appears slightly larger with the uncinate measuring 2.7 x 2.5 on comparison MRI (image 66/series 07/05/2001). Mild pancreatic duct dilatation unchanged. Spleen: Normal spleen. Adrenals/urinary tract: Nodule within the RIGHT adrenal gland measuring 13 mm unchanged kidneys are normal. Stomach/Bowel: Stomach, duodenum small-bowel normal. There is some bowel wall thickening in the ascending colon cecum small amount of inflammation (image 70/2). The more distal colon rectosigmoid colon normal. Small amount of fluid along the LEFT pericolic gutter is noted. Small amount fluid in the pelvis additionally. Vascular/Lymphatic: Abdominal aortic normal caliber. No retroperitoneal periportal lymphadenopathy. Portal veins are patent. Hepatic veins patent. Hepatic artery is difficult to follow. Musculoskeletal: No aggressive osseous lesion IMPRESSION: 1. Mass in the uncinate of the pancreas does not appear improved and may be slightly larger. 2. Discrete hepatic metastasis are difficult to measure compared to comparison MRI. Suspected metastasis along the gallbladder fossa are noted. 3. Differential perfusion of the liver with hypoperfusion to the entire LEFT hepatic lobe. Portal veins patent. 4. Biliary stent in place without biliary duct dilatation. 5. New gallbladder wall thickening. This may relate to new intraperitoneal free fluid. Recommend correlation for cholecystitis. 6. Small amount of fluid along the pericolic gutters and pelvis and along the RIGHT hepatic margin is new from prior. 7. Small bowel inflammation involving the ascending colon is nonspecific. Normal white blood cell weighs against typhlitis. Electronically Signed   By: Suzy Bouchard M.D.   On: 10/31/2018 08:07   Mr Liver W ID Contrast  Result Date: 11/06/2018 CLINICAL DATA:  Follow-up metastatic pancreatic carcinoma. Undergoing  chemotherapy. Restaging. EXAM: MRI ABDOMEN WITHOUT AND WITH CONTRAST TECHNIQUE: Multiplanar multisequence MR imaging of the abdomen was performed both before and after the administration of intravenous contrast. CONTRAST:  10 mL Gadavist COMPARISON:  CT on 10/30/2018, and MRI 08/08/2018 FINDINGS: Lower chest: No acute findings. Hepatobiliary: Numerous small metastases are seen throughout the right and and left lobes, and these show overall compared to the prior MRI of 08/08/2018. An index lesion in the anterior right lobe adjacent gallbladder fossa currently measures 1.7 x 1.5 cm on 34/16, compared to 2.7 x 2.7 cm on previous study. An index lesion in the left lobe measures 1.2 x 0.8 cm on image 20/16, compared to 1.4 x 1.4 cm previously. No new or enlarging liver metastases identified. Internal biliary stent is seen in place, without significant biliary ductal dilatation. Pancreas: Soft tissue mass in the pancreatic head surrounding the common bile duct stent measures 3.5 x 3.1 cm on image 50/14, without significant change since prior study. Diffuse atrophy and pancreatic ductal dilatation is again seen in the body and tail. This mass involves and obstructs the portal venous confluence, and cavernous transformation of the portal vein is seen. Spleen: Marked splenomegaly is again seen with length of approximately 20 cm. No splenic masses identified. Adrenals/Urinary Tract: No masses identified. Stable small cyst in lower pole right kidney. No evidence of hydronephrosis. Stomach/Bowel: Visualized portion unremarkable. Vascular/Lymphatic: No pathologically enlarged lymph nodes identified. No abdominal aortic aneurysm. Enlargement of splenic veins and portosystemic venous collaterals in the upper abdomen are consistent with portal venous hypertension. Other:  Minimal ascites in right and left  upper quadrants. Musculoskeletal:  No suspicious bone lesions identified. IMPRESSION: Stable pancreatic head soft tissue mass  measuring approximately 3.5 cm. This mass involves and obstructs the portal venous confluence, and there is cavernous transformation of the portal vein. Enlarged splenic vein, marked splenomegaly, and portosystemic venous collaterals, consistent with portal venous hypertension. Diffuse hepatic metastases show mild improvement compared to prior exams. No new or progressive metastatic disease within the abdomen. Electronically Signed   By: Earle Gell M.D.   On: 11/06/2018 17:24   Dg Chest Portable 1 View  Result Date: 11/04/2018 CLINICAL DATA:  Fever, on chemotherapy EXAM: PORTABLE CHEST 1 VIEW COMPARISON:  11/03/2018 FINDINGS: There is a right-sided Port-A-Cath in satisfactory position. There is no focal parenchymal opacity. There is no pleural effusion or pneumothorax. The heart and mediastinal contours are unremarkable. The osseous structures are unremarkable. IMPRESSION: No active disease. Electronically Signed   By: Kathreen Devoid   On: 11/04/2018 17:03   Dg Chest Port 1 View  Result Date: 11/03/2018 CLINICAL DATA:  Fever.  History of pancreatic cancer. EXAM: PORTABLE CHEST 1 VIEW COMPARISON:  June 22, 2018 FINDINGS: The right Port-A-Cath is stable. The heart, hila, mediastinum, lungs, and pleura are normal. IMPRESSION: No active disease. Electronically Signed   By: Dorise Bullion III M.D   On: 11/03/2018 21:25   Ct Head Code Stroke Wo Contrast  Result Date: 11/04/2018 CLINICAL DATA:  Code stroke.  Aphasia.  Decreased responsiveness. EXAM: CT HEAD WITHOUT CONTRAST TECHNIQUE: Contiguous axial images were obtained from the base of the skull through the vertex without intravenous contrast. COMPARISON:  None. FINDINGS: Brain: There is an approximately 1 cm focus of hypoattenuation involving cortex and subcortical white matter in the right superior frontal gyrus suggestive of an infarct of indeterminate acuity. Patchy hypodensities are present elsewhere in the cerebral white matter bilaterally,  nonspecific but compatible with mild chronic small vessel ischemic disease. No intracranial hemorrhage, mass, midline shift, or extra-axial fluid collection is identified. The ventricles and sulci are normal for age. Vascular: Calcified atherosclerosis at the skull base. No hyperdense vessel. Skull: No fracture or focal osseous lesion. Sinuses/Orbits: Air cell opacification at the right mastoid tip. Clear paranasal sinuses. Unremarkable orbits. Other: None. ASPECTS [facility] Stroke Program Early CT Score) 9 or 10 for the right MCA territory, 10 for the left MCA territory IMPRESSION: 1. Small age indeterminate infarct in the right superior frontal gyrus. 2. No intracranial hemorrhage. 3. Mild chronic small vessel ischemic disease. These results were communicated to Dr. Lorraine Lax at 4:19 pm on 11/04/2018 by text page via the [facility] messaging system. Electronically Signed   By: Logan Bores M.D.   On: 11/04/2018 16:20   US Abdomen Limited Ruq  Result Date: 11/04/2018 CLINICAL DATA:  Fever. History of metastatic pancreatic cancer. Biliary stent placed 2019 EXAM: ULTRASOUND ABDOMEN LIMITED RIGHT UPPER QUADRANT COMPARISON:  10/30/2018 CT FINDINGS: Gallbladder: No visible stones. There is mild gallbladder wall thickening at 4 mm. Surrounding pericholecystic fluid. Sludge noted within the gallbladder. Common bile duct: Diameter: Biliary stent in place which obscures the mid to distal common bile duct. Proximally, the common bile duct measures 5 mm. Liver: Heterogeneous echotexture throughout the liver. Increased echotexture throughout much of the liver, particularly near the gallbladder fossa. This likely reflects focal fatty infiltration. No visible well-defined focal mass lesion. Portal vein is patent on color Doppler imaging with normal direction of blood flow towards the liver. Portal vein is prominent measuring up to 2.4 cm. IMPRESSION: Mild gallbladder wall thickening with  pericholecystic fluid and sludge within the  gallbladder. No tenderness over the gallbladder during the study or stones visualized. Heterogeneous echotexture throughout the liver which may reflect focal/geographic fatty infiltration. Prominent portal vein, 2.4 cm. Portal vein is patent with blood flow toward the liver. Electronically Signed   By: Rolm Baptise M.D.   On: 11/04/2018 19:26    ASSESSMENT AND PLAN: 1. Pancreas head mass  CT abdomen/pelvis 08/03/2017-fullness in the head of the pancreas with stranding in the peripancreatic fat and pancreatic ductal dilatation.   MRI abdomen 08/04/2017-findings favored to represent acute on chronic pancreatitis; equivocal soft tissue fullness within the head and uncinate process of the pancreas; indeterminate too small to characterize right hepatic lobe lesion; small volume abdominal ascites; splenomegaly.   CA-19-9 elevated at 977on 08/04/2017.  Status postupper EUS 08/10/2017-findings of an irregular masslike region in the pancreatic head measuring approximately 2.7 cm. This directly abutted the main portal vein but no other major vascular structures. Biopsies obtained with preliminary cytology positive for malignancy, likely adenocarcinoma. The final report is pending. The common bile duct and main pancreatic duct were both dilated. ERCP was then proceeded with forstent placement. Multiple attempts were made tocannulate the bile duct without success. The procedure was aborted.  08/16/2017 ERCP with placement of a metal biliary stent in the common bile duct by Dr. Francella Solian at [facility].  Cycle 1 FOLFIRINOX 08/31/2017  Cycle 2 FOLFIRINOX 09/14/2017  Cycle 3 FOLFIRINOX 09/28/2017  Cycle 4 FOLFIRINOX 10/12/2017 (oxaliplatin dose reduced secondary to thrombocytopenia)  Cycle 5 FOLFIRINOX 10/26/2017  Cycle 6 FOLFIRINOX 11/09/2017  Restaging CTs at [facility] 11/29/2017-no definitive evidence of distant metastatic disease. 2 subcentimeter liver lesions described on prior MRI not visualized on CT.  Ill-defined pancreatic head mass stable to decreased in size measuring 2.1 x 2 cm. Peripancreatic inflammatory stranding decreased from prior. No biliary ductal dilatation. Celiac axis less than 180 degrees abutment. Common hepatic artery with greater than 180 degrees encasement; superior mesenteric artery with short segment less than 180 degrees abutment; portal vein/superior mesenteric vein with greater than 180 degrees encasement of the extrahepatic portal vein with associated circumferential narrowing at the portomesenteric, overall substantially improved from prior examination. Now less than 180 degree abutment of the SMV. The portal vein and SMV remain patent. Splenic vein patent.  Cycle 1 gemcitabine/Abraxane 12/06/2017  Cycle 2 gemcitabine/Abraxane 12/20/2017  Cycle 3 gemcitabine/Abraxane 01/03/2018  Cycle 4 gemcitabine/Abraxane 01/17/2018  Cycle 5 gemcitabine 01/31/2018 (Abraxane held due to neuropathy)  Cycle 6 gemcitabine 02/14/2018 (Abraxane held due to neuropathy)  Cycle 7 gemcitabine 02/28/2018 (Abraxane held due to neuropathy)  CT chest/abdomen/pelvis at [facility] 03/08/2018- stable appearance of previously identified infiltrating pancreatic head mass. No CT evidence of metastatic disease.  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 Ill-defined pancreatic head mass not felt to be significantly changed.  CT chest/abdomen/pelvis 05/10/2018- ill-defined hypoattenuating pancreatic head mass and ill-defined soft tissue abutting  the celiac axis with mildly increased dilatation of the main pancreatic duct. New soft tissue nodule along the right upper lobe bronchus; hypoattenuating liver lesions concerning for metastasis.  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 The stent traverses the pancreatic mass and extends into the duodenum. Increase in size of 2 liver lesions. Pancreatic mass appears similar. Indistinct tissue planes around the duodenum along with wall thickening in the gallbladder and especially the gallbladder neck.  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 Hypertension 3. Port-A-Cath placement 08/30/2017 4. History of elevated bilirubin-questionGilbert's syndrome 5. Mild leukopenia, thrombocytopenia-question secondary to portal hypertension/splenomegaly;09/14/2017 white count improved, platelet count stable;progressive thrombocytopenia following gemcitabine/Abraxane 6. Kidney stones 7. Fever following cycles 2 and 3 gemcitabine/Abraxane,? Fever related to gemcitabine 8.Jaundice, elevated liver enzymes and bilirubin 06/22/2018; status post ERCP 06/25/2018 with debris removed from the existing stent and a new plastic stent placed. Recurrent jaundice 07/19/2018 status post ERCP 07/20/2018 with finding that the previously placed plastic stent had migrated distally. There was debris and sludge on the stent. The previously placed stent was removed. 5 to 8 mm stricture just proximal to the existing SEMS. 2 plastic stents were placed.ERCP 08/16/2018- 2 plastic biliary stents were removed from within the previously placed partially covered SEMS; stricture confirmed just proximal to the previously placed SEMS, treated with placement of an 8 cm long 10 mm diameter uncovered SEMS in good position.  Mr. Lavell is a 63 year old male with Metastatic pancreatic cancer. He is on FOLFIRINOX. Admitted with fever and chills and found to have positive blood cultures. Remains on IV antibiotics. Liver MRI is pending this afternoon. He has mild anemia and thrombocytopenia secondary to portal hypertension/splenomegaly. LFTs and T Bili elevated, but trending downward.   -Await results of liver MRI. -Monitor CBC and transfuse PRBC if Hemoglobin <7.0 or active bleeding and Platelets if <10,000 or active bleeding.  -Monitor LFTs -Antibiotics per primary  team. -Will resume chemotherapy as an outpatient once infection resolved.     LOS: 3 days   Betsy Coder, DNP, AGPCNP-BC, AOCNP 11/07/18   Mr. Breighner appears well.  He was admitted with bacteremia, likely from the biliary tract.  I have a low clinical suspicion for a Port-A-Cath infection.  The restaging abdominal MRI reveals no evidence of progressive pancreas cancer and the liver lesions are smaller.  The  plan is to continue FOLFIRINOX. 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 He is stable for discharge from oncology standpoint. Recommendations: 1.  Complete outpatient course of antibiotics as recommended by infectious disease 2.  Outpatient follow-up for chemotherapy will be scheduled at the Cancer center 3.  Please call Oncology as needed

## 2018-11-07 NOTE — H&P (View-Only) (Signed)
HEMATOLOGY-ONCOLOGY PROGRESS NOTE  SUBJECTIVE: He feels well, no complaint.  He wants to go home.    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[place])    08/31/2017 - 11/22/2017 Chemotherapy    The patient had palonosetron (ALOXI) injection 0.25 mg, 0.25 mg, Intravenous,  Once, 6 of 6 cycles Administration: 0.25 mg (08/31/2017), 0.25 mg (09/14/2017), 0.25 mg (09/28/2017), 0.25 mg (10/12/2017), 0.25 mg (10/26/2017), 0.25 mg (11/09/2017) pegfilgrastim-cbqv (UDENYCA) injection 6 mg, 6 mg, Subcutaneous, Once, 6 of 6 cycles Administration: 6 mg (09/02/2017), 6 mg (09/16/2017), 6 mg (09/30/2017), 6 mg (10/14/2017), 6 mg (10/28/2017), 6 mg (11/11/2017) irinotecan (CAMPTOSAR) 360 mg in sodium chloride 0.9 % 500 mL chemo infusion, 150 mg/m2 = 360 mg (100 % of original dose 150 mg/m2), Intravenous,  Once, 6 of 6 cycles Dose modification: 150 mg/m2 (original dose 150 mg/m2, Cycle 1, Reason: Provider Judgment) Administration: 360 mg (08/31/2017), 360 mg (09/14/2017), 360 mg (09/28/2017), 340 mg (10/12/2017), 340 mg (10/26/2017), 340 mg (11/09/2017) leucovorin 952 mg in sodium chloride 0.9 % 250 mL infusion, 400 mg/m2 = 952 mg, Intravenous,  Once, 6 of 6 cycles Administration: 952 mg (08/31/2017), 952 mg (09/14/2017), 952 mg (09/28/2017), 888 mg (10/12/2017), 888 mg (10/26/2017), 888 mg (11/09/2017) oxaliplatin (ELOXATIN) 200 mg in dextrose 5 % 500 mL chemo infusion, 85 mg/m2 = 200 mg, Intravenous,  Once, 6 of 6 cycles Dose modification: 65 mg/m2 (original dose 85 mg/m2, Cycle 4, Reason: Provider Judgment) Administration: 200 mg (08/31/2017), 200 mg (09/14/2017), 200 mg (09/28/2017), 145 mg (10/12/2017), 150 mg (10/26/2017), 150 mg (11/09/2017) fosaprepitant (EMEND) 150 mg, dexamethasone (DECADRON) 12 mg in sodium chloride 0.9 % 145 mL IVPB, , Intravenous,  Once, 6 of 6 cycles Administration:  (08/31/2017),  (09/14/2017),  (09/28/2017),  (10/12/2017),  (10/26/2017),   (11/09/2017) fluorouracil (ADRUCIL) 5,700 mg in sodium chloride 0.9 % 136 mL chemo infusion, 2,400 mg/m2 = 5,700 mg, Intravenous, 1 Day/Dose, 6 of 6 cycles Administration: 5,700 mg (08/31/2017), 5,700 mg (09/14/2017), 5,700 mg (09/28/2017), 5,350 mg (10/12/2017), 5,350 mg (10/26/2017), 5,350 mg (11/09/2017)  for chemotherapy treatment.     12/06/2017 - 02/28/2018 Chemotherapy    The patient had gemcitabine (GEMZAR) 1,786 mg in sodium chloride 0.9 % 250 mL chemo infusion, 800 mg/m2 = 1,786 mg (80 % of original dose 1,000 mg/m2), Intravenous,  Once, 4 of 4 cycles Dose modification: 800 mg/m2 (original dose 1,000 mg/m2, Cycle 1, Reason: Provider Judgment), 800 mg/m2 (original dose 1,000 mg/m2, Cycle 1, Reason: Provider Judgment, Comment: call from MD to decrease dose due to counts) Administration: 1,786 mg (12/20/2017), 1,786 mg (12/06/2017), 1,786 mg (01/03/2018), 1,786 mg (01/17/2018), 1,786 mg (01/31/2018), 1,786 mg (02/14/2018), 1,786 mg (02/28/2018) PACLitaxel-protein bound (ABRAXANE) chemo infusion 225 mg, 100 mg/m2 = 225 mg (80 % of original dose 125 mg/m2), Intravenous, Once, 3 of 3 cycles Dose modification: 100 mg/m2 (original dose 125 mg/m2, Cycle 1, Reason: Provider Judgment), 100 mg/m2 (original dose 125 mg/m2, Cycle 1, Reason: Provider Judgment, Comment: call from MD to decrease dose based on counts) Administration: 225 mg (12/20/2017), 225 mg (12/06/2017), 225 mg (01/03/2018), 225 mg (01/17/2018)  for chemotherapy treatment.     07/04/2018 Cancer Staging    Staging form: Exocrine Pancreas, AJCC 8th Edition - Pathologic: Stage IV (cM1) - Signed by Ladell Pier, MD on 07/04/2018    08/21/2018 -  Chemotherapy    The patient had palonosetron (ALOXI) injection 0.25 mg, 0.25 mg, Intravenous,  Once, 5  of 6 cycles Administration: 0.25 mg (08/21/2018), 0.25 mg (09/03/2018), 0.25 mg (09/18/2018), 0.25 mg (10/01/2018), 0.25 mg (10/16/2018) pegfilgrastim-cbqv (UDENYCA) injection 6 mg, 6 mg, Subcutaneous, Once, 5 of 6  cycles Administration: 6 mg (08/23/2018), 6 mg (09/05/2018), 6 mg (09/20/2018), 6 mg (10/03/2018), 6 mg (10/18/2018) irinotecan (CAMPTOSAR) 360 mg in dextrose 5 % 500 mL chemo infusion, 150 mg/m2 = 360 mg (100 % of original dose 150 mg/m2), Intravenous,  Once, 5 of 6 cycles Dose modification: 150 mg/m2 (original dose 150 mg/m2, Cycle 1, Reason: Provider Judgment), 112.5 mg/m2 (original dose 150 mg/m2, Cycle 1, Reason: Change in LFTs) Administration: 260 mg (08/21/2018), 260 mg (09/03/2018), 260 mg (09/18/2018), 260 mg (10/01/2018), 200 mg (10/16/2018) leucovorin 952 mg in dextrose 5 % 250 mL infusion, 400 mg/m2 = 952 mg, Intravenous,  Once, 5 of 6 cycles Dose modification: 300 mg/m2 (original dose 400 mg/m2, Cycle 1, Reason: Provider Judgment) Administration: 696 mg (08/21/2018), 696 mg (09/03/2018), 696 mg (09/18/2018), 676 mg (10/01/2018), 676 mg (10/16/2018) oxaliplatin (ELOXATIN) 155 mg in dextrose 5 % 500 mL chemo infusion, 65 mg/m2 = 155 mg (100 % of original dose 65 mg/m2), Intravenous,  Once, 5 of 6 cycles Dose modification: 65 mg/m2 (original dose 65 mg/m2, Cycle 1, Reason: Provider Judgment) Administration: 150 mg (08/21/2018), 150 mg (09/03/2018), 150 mg (09/18/2018), 150 mg (10/01/2018), 150 mg (10/16/2018) fosaprepitant (EMEND) 150 mg, dexamethasone (DECADRON) 12 mg in sodium chloride 0.9 % 145 mL IVPB, , Intravenous,  Once, 5 of 6 cycles Administration:  (08/21/2018),  (09/03/2018),  (09/18/2018),  (10/01/2018),  (10/16/2018) fluorouracil (ADRUCIL) 5,700 mg in sodium chloride 0.9 % 136 mL chemo infusion, 2,400 mg/m2 = 5,700 mg, Intravenous, 1 Day/Dose, 5 of 6 cycles Dose modification: 1,800 mg/m2 (original dose 2,400 mg/m2, Cycle 1, Reason: Change in LFTs) Administration: 4,200 mg (08/21/2018), 4,200 mg (09/03/2018), 4,200 mg (09/18/2018), 4,050 mg (10/01/2018), 4,050 mg (10/16/2018)  for chemotherapy treatment.        I have reviewed the past medical history, past surgical history, social history and family history  with the patient and they are unchanged from previous note.   PHYSICAL EXAMINATION:  Vitals:   11/07/18 0321 11/07/18 0853  BP: (!) 145/100 (!) 141/89  Pulse: (!) 57 (!) 54  Resp: 17 15  Temp: 98.4 F (36.9 C) 97.7 F (36.5 C)  SpO2: 96% 98%   Filed Weights   11/05/18 0151  Weight: 207 lb 10.8 oz (94.2 kg)    Intake/Output from previous day: 05/05 0701 - 05/06 0700 In: 220 [P.O.:120; IV Piggyback:100] Out: -   Port-A-Cath-no erythema or tenderness Abdomen- no hepatosplenomegaly, nontender  LABORATORY DATA:  I have reviewed the data as listed CMP Latest Ref Rng & Units 11/07/2018 11/06/2018 11/05/2018  Glucose 70 - 99 mg/dL 101(H) 99 111(H)  BUN 8 - 23 mg/dL 7(L) 10 12  Creatinine 0.61 - 1.24 mg/dL 0.70 0.91 0.86  Sodium 135 - 145 mmol/L 134(L) 135 134(L)  Potassium 3.5 - 5.1 mmol/L 3.4(L) 3.5 3.7  Chloride 98 - 111 mmol/L 96(L) 101 103  CO2 22 - 32 mmol/L 23 24 21(L)  Calcium 8.9 - 10.3 mg/dL 8.3(L) 8.1(L) 7.8(L)  Total Protein 6.5 - 8.1 g/dL 5.2(L) 4.8(L) 4.7(L)  Total Bilirubin 0.3 - 1.2 mg/dL 0.8 1.0 1.2  Alkaline Phos 38 - 126 U/L 473(H) 429(H) 450(H)  AST 15 - 41 U/L 34 44(H) 75(H)  ALT 0 - 44 U/L 58(H) 67(H) 87(H)    Lab Results  Component Value Date   WBC 4.3 11/07/2018  HGB 10.6 (L) 11/07/2018   HCT 32.0 (L) 11/07/2018   MCV 83.3 11/07/2018   PLT 93 (L) 11/07/2018   NEUTROABS 12.1 (H) 11/04/2018    Ct Angio Head W Or Wo Contrast  Result Date: 11/04/2018 CLINICAL DATA:  Aphasia.  Decreased responsiveness. EXAM: CT ANGIOGRAPHY HEAD AND NECK TECHNIQUE: Multidetector CT imaging of the head and neck was performed using the standard protocol during bolus administration of intravenous contrast. Multiplanar CT image reconstructions and MIPs were obtained to evaluate the vascular anatomy. Carotid stenosis measurements (when applicable) are obtained utilizing NASCET criteria, using the distal internal carotid diameter as the denominator. CONTRAST:  54m OMNIPAQUE  IOHEXOL 350 MG/ML SOLN COMPARISON:  None. FINDINGS: CTA NECK FINDINGS Aortic arch: Standard 3 vessel aortic arch with mild atherosclerotic plaque. Widely patent arch vessel origins. Right carotid system: Patent with mild plaque about the carotid bifurcation. No evidence of significant stenosis or dissection. Left carotid system: Patent with mild plaque about the carotid bifurcation. No evidence of significant stenosis or dissection. Vertebral arteries: Patent without evidence of significant stenosis or dissection. Nonstenotic calcified plaque at the right vertebral artery origin. Mildly dominant left vertebral artery. Skeleton: Mild cervical spondylosis. Other neck: No neck mass or enlarged lymph nodes identified. Upper chest: Clear lung apices. Review of the MIP images confirms the above findings CTA HEAD FINDINGS Anterior circulation: The internal carotid arteries are patent from skull base to carotid termini with mild nonstenotic plaque bilaterally. ACAs and MCAs are patent with branch vessel irregularity but no evidence of proximal branch occlusion or significant proximal stenosis. No aneurysm is identified. Posterior circulation: The intracranial vertebral arteries are patent to the basilar. Patent left PICA and bilateral SCAs are visualized. AICAs and a right PICA are not clearly identified. The basilar artery is widely patent. Posterior communicating arteries are not clearly identified and may be diminutive or absent. PCAs are patent without evidence of significant proximal stenosis. No aneurysm is identified. Venous sinuses: Patent. Anatomic variants: None. Review of the MIP images confirms the above findings IMPRESSION: 1. No arch vessel occlusion. 2. Mild atherosclerosis in the head and neck without significant stenosis. 3.  Aortic Atherosclerosis (ICD10-I70.0). These results were communicated to Dr. ALorraine Laxat 4:19 pm on 11/04/2018 by text page via the [facility]aging system. Electronically Signed   By: ALogan BoresM.D.   On: 11/04/2018 16:33   Ct Angio Neck W Or Wo Contrast  Result Date: 11/04/2018 CLINICAL DATA:  Aphasia.  Decreased responsiveness. EXAM: CT ANGIOGRAPHY HEAD AND NECK TECHNIQUE: Multidetector CT imaging of the head and neck was performed using the standard protocol during bolus administration of intravenous contrast. Multiplanar CT image reconstructions and MIPs were obtained to evaluate the vascular anatomy. Carotid stenosis measurements (when applicable) are obtained utilizing NASCET criteria, using the distal internal carotid diameter as the denominator. CONTRAST:  755mOMNIPAQUE IOHEXOL 350 MG/ML SOLN COMPARISON:  None. FINDINGS: CTA NECK FINDINGS Aortic arch: Standard 3 vessel aortic arch with mild atherosclerotic plaque. Widely patent arch vessel origins. Right carotid system: Patent with mild plaque about the carotid bifurcation. No evidence of significant stenosis or dissection. Left carotid system: Patent with mild plaque about the carotid bifurcation. No evidence of significant stenosis or dissection. Vertebral arteries: Patent without evidence of significant stenosis or dissection. Nonstenotic calcified plaque at the right vertebral artery origin. Mildly dominant left vertebral artery. Skeleton: Mild cervical spondylosis. Other neck: No neck mass or enlarged lymph nodes identified. Upper chest: Clear lung apices. Review of the MIP images confirms  the above findings CTA HEAD FINDINGS Anterior circulation: The internal carotid arteries are patent from skull base to carotid termini with mild nonstenotic plaque bilaterally. ACAs and MCAs are patent with branch vessel irregularity but no evidence of proximal branch occlusion or significant proximal stenosis. No aneurysm is identified. Posterior circulation: The intracranial vertebral arteries are patent to the basilar. Patent left PICA and bilateral SCAs are visualized. AICAs and a right PICA are not clearly identified. The basilar artery is  widely patent. Posterior communicating arteries are not clearly identified and may be diminutive or absent. PCAs are patent without evidence of significant proximal stenosis. No aneurysm is identified. Venous sinuses: Patent. Anatomic variants: None. Review of the MIP images confirms the above findings IMPRESSION: 1. No arch vessel occlusion. 2. Mild atherosclerosis in the head and neck without significant stenosis. 3.  Aortic Atherosclerosis (ICD10-I70.0). These results were communicated to Dr. Lorraine Lax at 4:19 pm on 11/04/2018 by text page via the [facility] messaging system. Electronically Signed   By: Logan Bores M.D.   On: 11/04/2018 16:33   Mr Brain Wo Contrast  Result Date: 11/05/2018 CLINICAL DATA:  63 y/o  M; episode of confusion and aphasia. EXAM: MRI HEAD WITHOUT CONTRAST TECHNIQUE: Multiplanar, multiecho pulse sequences of the brain and surrounding structures were obtained without intravenous contrast. COMPARISON:  11/04/2018 CT head and CTA head. FINDINGS: Brain: No acute infarction, hemorrhage, hydrocephalus, extra-axial collection or mass lesion. Punctate and early confluent nonspecific T2 FLAIR hyperintensities in subcortical and periventricular white matter are compatible with mild to moderate chronic microvascular ischemic changes. Mild volume loss of the brain. Numerous punctate and small foci of susceptibility hypointensity are present throughout the brain in a predominantly peripheral distribution compatible with chronic microhemorrhage. Vascular: Normal flow voids. Skull and upper cervical spine: Normal marrow signal. Sinuses/Orbits: Negative. Other: None. IMPRESSION: 1. No acute intracranial abnormality identified. 2. Mild-to-moderate chronic microvascular ischemic changes and mild volume loss of the brain. 3. 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 Electronically Signed    By: Kristine Garbe M.D.   On: 11/05/2018 01:39   Mr Brain W Contrast  Result Date: 11/05/2018 CLINICAL DATA:  Follow-up intracranial hemorrhage. EXAM: MRI HEAD WITH CONTRAST TECHNIQUE: Multiplanar, multiecho pulse sequences of the brain and surrounding structures were obtained with intravenous contrast. CONTRAST:  9 cc Gadavist intravenous COMPARISON:  Noncontrast brain MRI from earlier today FINDINGS: Brain: No abnormal intracranial enhancement to suggest metastatic disease or inflammation. The patient has numerous foci of susceptibility artifact on prior gradient imaging that are not apparent on postcontrast T1. Vascular: Major vessels are enhancing. Skull and upper cervical spine: Negative for marrow lesion Sinuses/Orbits: Negative IMPRESSION: No abnormal enhancement.  No evidence of metastatic disease. Electronically Signed   By: Monte Fantasia M.D.   On: 11/05/2018 11:51   Ct Abdomen Pelvis W Contrast  Result Date: 10/31/2018 CLINICAL DATA:  Pancreatic carcinoma. Liver metastasis. Esophageal chemotherapy ongoing. Five cycles. EXAM: CT ABDOMEN AND PELVIS WITH CONTRAST TECHNIQUE: Multidetector CT imaging of the abdomen and pelvis was performed using the standard protocol following bolus administration of intravenous contrast. CONTRAST:  175m ISOVUE-300 IOPAMIDOL (ISOVUE-300) INJECTION 61% COMPARISON:  MRI 08/08/2018, 08/03/2017 FINDINGS: Lower chest:  Lung bases are clear Hepatobiliary: Differential perfusion with hypoperfusion of the LEFT hepatic lobe and normal perfusion of the RIGHT hepatic lobe. Common bile duct stent in place. Expected pneumobilia in the nondependent liver. No evidence of biliary duct dilatation. The hepatic metastasis  identified on comparison MRI are not well appreciated by CT imaging. Some lesions along the gallbladder fossa are noted presumed metastasis (image 32/2). The gallbladder wall is thickened which is increased from comparison exam. Pancreas: Mass in the head of  the pancreas/uncinate measures 3.8 by 2.8 cm and difficult to measure mass on comparison MRI but the uncinate mass appears slightly larger with the uncinate measuring 2.7 x 2.5 on comparison MRI (image 66/series 07/05/2001). Mild pancreatic duct dilatation unchanged. Spleen: Normal spleen. Adrenals/urinary tract: Nodule within the RIGHT adrenal gland measuring 13 mm unchanged kidneys are normal. Stomach/Bowel: Stomach, duodenum small-bowel normal. There is some bowel wall thickening in the ascending colon cecum small amount of inflammation (image 70/2). The more distal colon rectosigmoid colon normal. Small amount of fluid along the LEFT pericolic gutter is noted. Small amount fluid in the pelvis additionally. Vascular/Lymphatic: Abdominal aortic normal caliber. No retroperitoneal periportal lymphadenopathy. Portal veins are patent. Hepatic veins patent. Hepatic artery is difficult to follow. Musculoskeletal: No aggressive osseous lesion IMPRESSION: 1. Mass in the uncinate of the pancreas does not appear improved and may be slightly larger. 2. Discrete hepatic metastasis are difficult to measure compared to comparison MRI. Suspected metastasis along the gallbladder fossa are noted. 3. Differential perfusion of the liver with hypoperfusion to the entire LEFT hepatic lobe. Portal veins patent. 4. Biliary stent in place without biliary duct dilatation. 5. New gallbladder wall thickening. This may relate to new intraperitoneal free fluid. Recommend correlation for cholecystitis. 6. Small amount of fluid along the pericolic gutters and pelvis and along the RIGHT hepatic margin is new from prior. 7. Small bowel inflammation involving the ascending colon is nonspecific. Normal white blood cell weighs against typhlitis. Electronically Signed   By: Suzy Bouchard M.D.   On: 10/31/2018 08:07   Mr Liver W ZO Contrast  Result Date: 11/06/2018 CLINICAL DATA:  Follow-up metastatic pancreatic carcinoma. Undergoing  chemotherapy. Restaging. EXAM: MRI ABDOMEN WITHOUT AND WITH CONTRAST TECHNIQUE: Multiplanar multisequence MR imaging of the abdomen was performed both before and after the administration of intravenous contrast. CONTRAST:  10 mL Gadavist COMPARISON:  CT on 10/30/2018, and MRI 08/08/2018 FINDINGS: Lower chest: No acute findings. Hepatobiliary: Numerous small metastases are seen throughout the right and and left lobes, and these show overall compared to the prior MRI of 08/08/2018. An index lesion in the anterior right lobe adjacent gallbladder fossa currently measures 1.7 x 1.5 cm on 34/16, compared to 2.7 x 2.7 cm on previous study. An index lesion in the left lobe measures 1.2 x 0.8 cm on image 20/16, compared to 1.4 x 1.4 cm previously. No new or enlarging liver metastases identified. Internal biliary stent is seen in place, without significant biliary ductal dilatation. Pancreas: Soft tissue mass in the pancreatic head surrounding the common bile duct stent measures 3.5 x 3.1 cm on image 50/14, without significant change since prior study. Diffuse atrophy and pancreatic ductal dilatation is again seen in the body and tail. This mass involves and obstructs the portal venous confluence, and cavernous transformation of the portal vein is seen. Spleen: Marked splenomegaly is again seen with length of approximately 20 cm. No splenic masses identified. Adrenals/Urinary Tract: No masses identified. Stable small cyst in lower pole right kidney. No evidence of hydronephrosis. Stomach/Bowel: Visualized portion unremarkable. Vascular/Lymphatic: No pathologically enlarged lymph nodes identified. No abdominal aortic aneurysm. Enlargement of splenic veins and portosystemic venous collaterals in the upper abdomen are consistent with portal venous hypertension. Other:  Minimal ascites in right and left  upper quadrants. Musculoskeletal:  No suspicious bone lesions identified. IMPRESSION: Stable pancreatic head soft tissue mass  measuring approximately 3.5 cm. This mass involves and obstructs the portal venous confluence, and there is cavernous transformation of the portal vein. Enlarged splenic vein, marked splenomegaly, and portosystemic venous collaterals, consistent with portal venous hypertension. Diffuse hepatic metastases show mild improvement compared to prior exams. No new or progressive metastatic disease within the abdomen. Electronically Signed   By: Earle Gell M.D.   On: 11/06/2018 17:24   Dg Chest Portable 1 View  Result Date: 11/04/2018 CLINICAL DATA:  Fever, on chemotherapy EXAM: PORTABLE CHEST 1 VIEW COMPARISON:  11/03/2018 FINDINGS: There is a right-sided Port-A-Cath in satisfactory position. There is no focal parenchymal opacity. There is no pleural effusion or pneumothorax. The heart and mediastinal contours are unremarkable. The osseous structures are unremarkable. IMPRESSION: No active disease. Electronically Signed   By: Kathreen Devoid   On: 11/04/2018 17:03   Dg Chest Port 1 View  Result Date: 11/03/2018 CLINICAL DATA:  Fever.  History of pancreatic cancer. EXAM: PORTABLE CHEST 1 VIEW COMPARISON:  June 22, 2018 FINDINGS: The right Port-A-Cath is stable. The heart, hila, mediastinum, lungs, and pleura are normal. IMPRESSION: No active disease. Electronically Signed   By: Dorise Bullion III M.D   On: 11/03/2018 21:25   Ct Head Code Stroke Wo Contrast  Result Date: 11/04/2018 CLINICAL DATA:  Code stroke.  Aphasia.  Decreased responsiveness. EXAM: CT HEAD WITHOUT CONTRAST TECHNIQUE: Contiguous axial images were obtained from the base of the skull through the vertex without intravenous contrast. COMPARISON:  None. FINDINGS: Brain: There is an approximately 1 cm focus of hypoattenuation involving cortex and subcortical white matter in the right superior frontal gyrus suggestive of an infarct of indeterminate acuity. Patchy hypodensities are present elsewhere in the cerebral white matter bilaterally,  nonspecific but compatible with mild chronic small vessel ischemic disease. No intracranial hemorrhage, mass, midline shift, or extra-axial fluid collection is identified. The ventricles and sulci are normal for age. Vascular: Calcified atherosclerosis at the skull base. No hyperdense vessel. Skull: No fracture or focal osseous lesion. Sinuses/Orbits: Air cell opacification at the right mastoid tip. Clear paranasal sinuses. Unremarkable orbits. Other: None. ASPECTS [facility] Stroke Program Early CT Score) 9 or 10 for the right MCA territory, 10 for the left MCA territory IMPRESSION: 1. Small age indeterminate infarct in the right superior frontal gyrus. 2. No intracranial hemorrhage. 3. Mild chronic small vessel ischemic disease. These results were communicated to Dr. Lorraine Lax at 4:19 pm on 11/04/2018 by text page via the [facility] messaging system. Electronically Signed   By: Logan Bores M.D.   On: 11/04/2018 16:20   US Abdomen Limited Ruq  Result Date: 11/04/2018 CLINICAL DATA:  Fever. History of metastatic pancreatic cancer. Biliary stent placed 2019 EXAM: ULTRASOUND ABDOMEN LIMITED RIGHT UPPER QUADRANT COMPARISON:  10/30/2018 CT FINDINGS: Gallbladder: No visible stones. There is mild gallbladder wall thickening at 4 mm. Surrounding pericholecystic fluid. Sludge noted within the gallbladder. Common bile duct: Diameter: Biliary stent in place which obscures the mid to distal common bile duct. Proximally, the common bile duct measures 5 mm. Liver: Heterogeneous echotexture throughout the liver. Increased echotexture throughout much of the liver, particularly near the gallbladder fossa. This likely reflects focal fatty infiltration. No visible well-defined focal mass lesion. Portal vein is patent on color Doppler imaging with normal direction of blood flow towards the liver. Portal vein is prominent measuring up to 2.4 cm. IMPRESSION: Mild gallbladder wall thickening with  pericholecystic fluid and sludge within the  gallbladder. No tenderness over the gallbladder during the study or stones visualized. Heterogeneous echotexture throughout the liver which may reflect focal/geographic fatty infiltration. Prominent portal vein, 2.4 cm. Portal vein is patent with blood flow toward the liver. Electronically Signed   By: Rolm Baptise M.D.   On: 11/04/2018 19:26    ASSESSMENT AND PLAN: 1. Pancreas head mass  CT abdomen/pelvis 08/03/2017-fullness in the head of the pancreas with stranding in the peripancreatic fat and pancreatic ductal dilatation.   MRI abdomen 08/04/2017-findings favored to represent acute on chronic pancreatitis; equivocal soft tissue fullness within the head and uncinate process of the pancreas; indeterminate too small to characterize right hepatic lobe lesion; small volume abdominal ascites; splenomegaly.   CA-19-9 elevated at 977on 08/04/2017.  Status postupper EUS 08/10/2017-findings of an irregular masslike region in the pancreatic head measuring approximately 2.7 cm. This directly abutted the main portal vein but no other major vascular structures. Biopsies obtained with preliminary cytology positive for malignancy, likely adenocarcinoma. The final report is pending. The common bile duct and main pancreatic duct were both dilated. ERCP was then proceeded with forstent placement. Multiple attempts were made tocannulate the bile duct without success. The procedure was aborted.  08/16/2017 ERCP with placement of a metal biliary stent in the common bile duct by Dr. Francella Solian at [facility].  Cycle 1 FOLFIRINOX 08/31/2017  Cycle 2 FOLFIRINOX 09/14/2017  Cycle 3 FOLFIRINOX 09/28/2017  Cycle 4 FOLFIRINOX 10/12/2017 (oxaliplatin dose reduced secondary to thrombocytopenia)  Cycle 5 FOLFIRINOX 10/26/2017  Cycle 6 FOLFIRINOX 11/09/2017  Restaging CTs at [facility] 11/29/2017-no definitive evidence of distant metastatic disease. 2 subcentimeter liver lesions described on prior MRI not visualized on CT.  Ill-defined pancreatic head mass stable to decreased in size measuring 2.1 x 2 cm. Peripancreatic inflammatory stranding decreased from prior. No biliary ductal dilatation. Celiac axis less than 180 degrees abutment. Common hepatic artery with greater than 180 degrees encasement; superior mesenteric artery with short segment less than 180 degrees abutment; portal vein/superior mesenteric vein with greater than 180 degrees encasement of the extrahepatic portal vein with associated circumferential narrowing at the portomesenteric, overall substantially improved from prior examination. Now less than 180 degree abutment of the SMV. The portal vein and SMV remain patent. Splenic vein patent.  Cycle 1 gemcitabine/Abraxane 12/06/2017  Cycle 2 gemcitabine/Abraxane 12/20/2017  Cycle 3 gemcitabine/Abraxane 01/03/2018  Cycle 4 gemcitabine/Abraxane 01/17/2018  Cycle 5 gemcitabine 01/31/2018 (Abraxane held due to neuropathy)  Cycle 6 gemcitabine 02/14/2018 (Abraxane held due to neuropathy)  Cycle 7 gemcitabine 02/28/2018 (Abraxane held due to neuropathy)  CT chest/abdomen/pelvis at [facility] 03/08/2018- stable appearance of previously identified infiltrating pancreatic head mass. No CT evidence of metastatic disease.  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 Ill-defined pancreatic head mass not felt to be significantly changed.  CT chest/abdomen/pelvis 05/10/2018- ill-defined hypoattenuating pancreatic head mass and ill-defined soft tissue abutting  the celiac axis with mildly increased dilatation of the main pancreatic duct. New soft tissue nodule along the right upper lobe bronchus; hypoattenuating liver lesions concerning for metastasis.  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 The stent traverses the pancreatic mass and extends into the duodenum. Increase in size of 2 liver lesions. Pancreatic mass appears similar. Indistinct tissue planes around the duodenum along with wall thickening in the gallbladder and especially the gallbladder neck.  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 Hypertension 3. Port-A-Cath placement 08/30/2017 4. History of elevated bilirubin-questionGilbert's syndrome 5. Mild leukopenia, thrombocytopenia-question secondary to portal hypertension/splenomegaly;09/14/2017 white count improved, platelet count stable;progressive thrombocytopenia following gemcitabine/Abraxane 6. Kidney stones 7. Fever following cycles 2 and 3 gemcitabine/Abraxane,? Fever related to gemcitabine 8.Jaundice, elevated liver enzymes and bilirubin 06/22/2018; status post ERCP 06/25/2018 with debris removed from the existing stent and a new plastic stent placed. Recurrent jaundice 07/19/2018 status post ERCP 07/20/2018 with finding that the previously placed plastic stent had migrated distally. There was debris and sludge on the stent. The previously placed stent was removed. 5 to 8 mm stricture just proximal to the existing SEMS. 2 plastic stents were placed.ERCP 08/16/2018- 2 plastic biliary stents were removed from within the previously placed partially covered SEMS; stricture confirmed just proximal to the previously placed SEMS, treated with placement of an 8 cm long 10 mm diameter uncovered SEMS in good position.  Mr. Fulop is a 63 year old male with Metastatic pancreatic cancer. He is on FOLFIRINOX. Admitted with fever and chills and found to have positive blood cultures. Remains on IV antibiotics. Liver MRI is pending this afternoon. He has mild anemia and thrombocytopenia secondary to portal hypertension/splenomegaly. LFTs and T Bili elevated, but trending downward.   -Await results of liver MRI. -Monitor CBC and transfuse PRBC if Hemoglobin <7.0 or active bleeding and Platelets if <10,000 or active bleeding.  -Monitor LFTs -Antibiotics per primary  team. -Will resume chemotherapy as an outpatient once infection resolved.     LOS: 3 days   Betsy Coder, DNP, AGPCNP-BC, AOCNP 11/07/18   Mr. Smolinsky appears well.  He was admitted with bacteremia, likely from the biliary tract.  I have a low clinical suspicion for a Port-A-Cath infection.  The restaging abdominal MRI reveals no evidence of progressive pancreas cancer and the liver lesions are smaller.  The  plan is to continue FOLFIRINOX. 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 He is stable for discharge from oncology standpoint. Recommendations: 1.  Complete outpatient course of antibiotics as recommended by infectious disease 2.  Outpatient follow-up for chemotherapy will be scheduled at the Cancer center 3.  Please call Oncology as needed

## 2018-11-07 NOTE — Consult Note (Signed)
[place] for Infectious Disease       Reason for Consult: bacteremia    Referring Physician: Dr. Rebeca Alert  Principal Problem:   Fever, unspecified Active Problems:   Elevated LFTs   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[place])   Port-A-Cath in place   Episode of transient neurologic symptoms   . enoxaparin (LOVENOX) injection  40 mg Subcutaneous Q24H  . irbesartan  300 mg Oral Daily  . sodium chloride flush  3 mL Intravenous Once    Recommendations: Ceftriaxone TEE Repeat blood cultures   Assessment: He has Strep group G bacteremia and some microvascular findings on MRI; emboli can't be ruled out.  Also with Pancreatic cancer and a port-a-cath.  With 1/4 blood cultures positive, repeat cultures negative, there is no absolute indication to remove port-a-cath.  I would though treat him with IV ceftriaxone for 2 weeks from negative culture if TEE negative for vegetation.    Antibiotics: ceftriaxone  HPI: Tayt Moyers is a 63 y.o. male with metastatic pancreatic cancer, recent biliary stent and recent stent issues presented with fever, chills, confusion.  Blood cultures with 1/4 with Group G Strep, penicillin intermediate and placed on ceftriaxone.  Has a port a cath and has ongoing chemo planned. Had intially been seen in [place] and given cipro empirically.  Feeling improved, repeat blood cultures remain negative.  WBC wnl.  Afebrile since admission.     Review of Systems:  Constitutional: negative for fevers and chills Gastrointestinal: negative for nausea and diarrhea Integument/breast: negative for rash Musculoskeletal: negative for myalgias and arthralgias All other systems reviewed and are negative    Past Medical History:  Diagnosis Date  . Abdominal pain    due to bloating  . Bloating   . Cancer ([place])    skin cancer  . Essential hypertension   . Fatigue   . History of kidney stones    passed  . History of weight loss   . HOH (hard of hearing)    no  hearing aids  . Neuralgia   . Pancreatic cancer ([place])   . Stress    loss of father in Dec 2018.    Social History   Tobacco Use  . Smoking status: Never Smoker  . Smokeless tobacco: Never Used  Substance Use Topics  . Alcohol use: No  . Drug use: No    Family History  Problem Relation Age of Onset  . Hyperlipidemia Father   . Heart disease Father   . Dementia Father   . Memory loss Mother   . Colon cancer Neg Hx   . Esophageal cancer Neg Hx   . Inflammatory bowel disease Neg Hx   . Liver disease Neg Hx   . Rectal cancer Neg Hx   . Stomach cancer Neg Hx   . Pancreatic cancer Neg Hx     No Known Allergies  Physical Exam: Constitutional: in no apparent distress  Vitals:   11/07/18 0321 11/07/18 0853  BP: (!) 145/100 (!) 141/89  Pulse: (!) 57 (!) 54  Resp: 17 15  Temp: 98.4 F (36.9 C) 97.7 F (36.5 C)  SpO2: 96% 98%   EYES: anicteric ENMT: no thrush Cardiovascular: Cor RRR Respiratory: CTA B; normal respiratory effort GI: soft Musculoskeletal: no pedal edema noted Skin: negatives: no rash Neuro: non-focal Lines: port-a-cath site without tenderness or erythema.   Lab Results  Component Value Date   WBC 4.3 11/07/2018   HGB 10.6 (L) 11/07/2018   HCT  32.0 (L) 11/07/2018   MCV 83.3 11/07/2018   PLT 93 (L) 11/07/2018    Lab Results  Component Value Date   CREATININE 0.70 11/07/2018   BUN 7 (L) 11/07/2018   NA 134 (L) 11/07/2018   K 3.4 (L) 11/07/2018   CL 96 (L) 11/07/2018   CO2 23 11/07/2018    Lab Results  Component Value Date   ALT 58 (H) 11/07/2018   AST 34 11/07/2018   ALKPHOS 473 (H) 11/07/2018     Microbiology: Recent Results (from the past 240 hour(s))  Blood Culture (routine x 2)     Status: Abnormal   Collection Time: 11/03/18  9:19 PM  Result Value Ref Range Status   Specimen Description   Final    BLOOD BLOOD RIGHT FOREARM Performed at [facility] [place] [address], [place], [place] [address]    Special  Requests   Final    BOTTLES DRAWN AEROBIC AND ANAEROBIC Blood Culture adequate volume Performed at [place] [address] [place], [place] [address]    Culture  Setup Time   Final    GRAM POSITIVE COCCI IN CHAINS ANAEROBIC BOTTLE ONLY CRITICAL RESULT CALLED TO, READ BACK BY AND VERIFIED WITH: FANNING R PHARMD AT 1400 ON [id number] BY SJW Performed at [facility], [place] [address] [place], [place] [address]    Culture STREPTOCOCCUS GROUP C (A)  Final   Report Status 11/07/2018 FINAL  Final   Organism ID, Bacteria STREPTOCOCCUS GROUP C  Final      Susceptibility   Streptococcus group c - MIC*    PENICILLIN INTERMEDIATE Intermediate     ERYTHROMYCIN <=0.12 SENSITIVE Sensitive     LEVOFLOXACIN <=0.25 SENSITIVE Sensitive     VANCOMYCIN 0.5 SENSITIVE Sensitive     * STREPTOCOCCUS GROUP C  SARS Coronavirus 2 [facility] order, Performed in [place] hospital lab)     Status: None   Collection Time: 11/03/18  9:19 PM  Result Value Ref Range Status   SARS Coronavirus 2 NEGATIVE NEGATIVE Final    Comment: (NOTE) If result is NEGATIVE SARS-CoV-2 target nucleic acids are NOT DETECTED. The SARS-CoV-2 RNA is generally detectable in upper and lower  respiratory specimens during the acute phase of infection. The lowest  concentration of SARS-CoV-2 viral copies this assay can detect is 250  copies / mL. A negative result does not preclude SARS-CoV-2 infection  and should not be used as the sole basis for treatment or other  patient management decisions.  A negative result may occur with  improper specimen collection / handling, submission of specimen other  than nasopharyngeal swab, presence of viral mutation(s) within the  areas targeted by this assay, and inadequate number of viral copies  (<250 copies / mL). A negative result must be combined with clinical  observations, patient history, and epidemiological information. If result is POSITIVE SARS-CoV-2 target  nucleic acids are DETECTED. The SARS-CoV-2 RNA is generally detectable in upper and lower  respiratory specimens dur ing the acute phase of infection.  Positive  results are indicative of active infection with SARS-CoV-2.  Clinical  correlation with patient history and other diagnostic information is  necessary to determine patient infection status.  Positive results do  not rule out bacterial infection or co-infection with other viruses. If result is PRESUMPTIVE POSTIVE SARS-CoV-2 nucleic acids MAY BE PRESENT.   A presumptive positive result was obtained on the submitted specimen  and confirmed on repeat testing.  While 2019  novel coronavirus  (SARS-CoV-2) nucleic acids may be present in the submitted sample  additional confirmatory testing may be necessary for epidemiological  and / or clinical management purposes  to differentiate between  SARS-CoV-2 and other Sarbecovirus currently known to infect humans.  If clinically indicated additional testing with an alternate test  methodology [phone]) is advised. The SARS-CoV-2 RNA is generally  detectable in upper and lower respiratory sp ecimens during the acute  phase of infection. The expected result is Negative. Fact Sheet for Patients:  StrictlyIdeas.no Fact Sheet for Healthcare Providers: BankingDealers.co.za This test is not yet approved or cleared by the [place] FDA and has been authorized for detection and/or diagnosis of SARS-CoV-2 by FDA under an Emergency Use Authorization (EUA).  This EUA will remain in effect (meaning this test can be used) for the duration of the COVID-19 declaration under Section 564(b)(1) of the Act, 21 U.S.C. section 360bbb-3(b)(1), unless the authorization is terminated or revoked sooner. Performed at [facility], [place] [address], [place], [place] [address]   Blood Culture ID Panel (Reflexed)     Status: Abnormal   Collection  Time: 11/03/18  9:19 PM  Result Value Ref Range Status   Enterococcus species NOT DETECTED NOT DETECTED Final   Listeria monocytogenes NOT DETECTED NOT DETECTED Final   Staphylococcus species NOT DETECTED NOT DETECTED Final   Staphylococcus aureus (BCID) NOT DETECTED NOT DETECTED Final   Streptococcus species DETECTED (A) NOT DETECTED Final    Comment: Not Enterococcus species, Streptococcus agalactiae, Streptococcus pyogenes, or Streptococcus pneumoniae. CRITICAL RESULT CALLED TO, READ BACK BY AND VERIFIED WITH: FANNING R PHARMD AT 1200 ON [id number] BY SJW    Streptococcus agalactiae NOT DETECTED NOT DETECTED Final   Streptococcus pneumoniae NOT DETECTED NOT DETECTED Final   Streptococcus pyogenes NOT DETECTED NOT DETECTED Final   Acinetobacter baumannii NOT DETECTED NOT DETECTED Final   Enterobacteriaceae species NOT DETECTED NOT DETECTED Final   Enterobacter cloacae complex NOT DETECTED NOT DETECTED Final   Escherichia coli NOT DETECTED NOT DETECTED Final   Klebsiella oxytoca NOT DETECTED NOT DETECTED Final   Klebsiella pneumoniae NOT DETECTED NOT DETECTED Final   Proteus species NOT DETECTED NOT DETECTED Final   Serratia marcescens NOT DETECTED NOT DETECTED Final   Haemophilus influenzae NOT DETECTED NOT DETECTED Final   Neisseria meningitidis NOT DETECTED NOT DETECTED Final   Pseudomonas aeruginosa NOT DETECTED NOT DETECTED Final   Candida albicans NOT DETECTED NOT DETECTED Final   Candida glabrata NOT DETECTED NOT DETECTED Final   Candida krusei NOT DETECTED NOT DETECTED Final   Candida parapsilosis NOT DETECTED NOT DETECTED Final   Candida tropicalis NOT DETECTED NOT DETECTED Final    Comment: Performed at [facility], [place] [address] [place], [place] [address]  Blood Culture (routine x 2)     Status: None (Preliminary result)   Collection Time: 11/03/18  9:20 PM  Result Value Ref Range Status   Specimen Description   Final    BLOOD PORTA CATH Performed at [place] [address] [place], [place] [address]    Special Requests   Final    BOTTLES DRAWN AEROBIC AND ANAEROBIC Blood Culture results may not be optimal due to an excessive volume of blood received in culture bottles Performed at [place] [address], [place], [place] [address]    Culture   Final    NO GROWTH 3 DAYS Performed at [facility], [place]  [address] [place], [place] [address]    Report Status PENDING  Incomplete  Urine culture     Status: None   Collection Time: 11/04/18  4:13 PM  Result Value Ref Range Status   Specimen Description URINE, RANDOM  Final   Special Requests NONE  Final   Culture   Final    NO GROWTH Performed at [facility], [address], [place], [place] [address]    Report Status 11/05/2018 FINAL  Final  SARS Coronavirus 2 (CEPHEID - Performed in [place] hospital lab), Hosp Order     Status: None   Collection Time: 11/04/18  4:46 PM  Result Value Ref Range Status   SARS Coronavirus 2 NEGATIVE NEGATIVE Final    Comment: (NOTE) If result is NEGATIVE SARS-CoV-2 target nucleic acids are NOT DETECTED. The SARS-CoV-2 RNA is generally detectable in upper and lower  respiratory specimens during the acute phase of infection. The lowest  concentration of SARS-CoV-2 viral copies this assay can detect is 250  copies / mL. A negative result does not preclude SARS-CoV-2 infection  and should not be used as the sole basis for treatment or other  patient management decisions.  A negative result may occur with  improper specimen collection / handling, submission of specimen other  than nasopharyngeal swab, presence of viral mutation(s) within the  areas targeted by this assay, and inadequate number of viral copies  (<250 copies / mL). A negative result must be combined with clinical  observations, patient history, and epidemiological information. If result is POSITIVE SARS-CoV-2 target nucleic acids are  DETECTED. The SARS-CoV-2 RNA is generally detectable in upper and lower  respiratory specimens dur ing the acute phase of infection.  Positive  results are indicative of active infection with SARS-CoV-2.  Clinical  correlation with patient history and other diagnostic information is  necessary to determine patient infection status.  Positive results do  not rule out bacterial infection or co-infection with other viruses. If result is PRESUMPTIVE POSTIVE SARS-CoV-2 nucleic acids MAY BE PRESENT.   A presumptive positive result was obtained on the submitted specimen  and confirmed on repeat testing.  While 2019 novel coronavirus  (SARS-CoV-2) nucleic acids may be present in the submitted sample  additional confirmatory testing may be necessary for epidemiological  and / or clinical management purposes  to differentiate between  SARS-CoV-2 and other Sarbecovirus currently known to infect humans.  If clinically indicated additional testing with an alternate test  methodology [phone]) is advised. The SARS-CoV-2 RNA is generally  detectable in upper and lower respiratory sp ecimens during the acute  phase of infection. The expected result is Negative. Fact Sheet for Patients:  StrictlyIdeas.no Fact Sheet for Healthcare Providers: BankingDealers.co.za This test is not yet approved or cleared by the [place] FDA and has been authorized for detection and/or diagnosis of SARS-CoV-2 by FDA under an Emergency Use Authorization (EUA).  This EUA will remain in effect (meaning this test can be used) for the duration of the COVID-19 declaration under Section 564(b)(1) of the Act, 21 U.S.C. section 360bbb-3(b)(1), unless the authorization is terminated or revoked sooner. Performed at [facility], [place] [address], [place], [place] [address]   Culture, blood (routine x 2)     Status: None (Preliminary result)   Collection Time: 11/04/18  5:15  PM  Result Value Ref Range Status   Specimen Description BLOOD RIGHT WRIST  Final   Special Requests AEROBIC BOTTLE ONLY Blood Culture adequate volume  Final   Culture   Final    NO GROWTH 3 DAYS Performed at [facility], [place] [address], [place], [place] [address]    Report Status PENDING  Incomplete  Culture, blood (routine x 2)     Status: None (Preliminary result)   Collection Time: 11/04/18  5:17 PM  Result Value Ref Range Status   Specimen Description BLOOD RIGHT ANTECUBITAL  Final   Special Requests   Final    BOTTLES DRAWN AEROBIC AND ANAEROBIC Blood Culture adequate volume   Culture   Final    NO GROWTH 3 DAYS Performed at [facility], [place] [address], [place],  [address]    Report Status PENDING  Incomplete    Thayer Headings, [place] for Infectious Disease [place] [email or web address] 11/07/2018, 9:09 AM

## 2018-11-08 ENCOUNTER — Inpatient Hospital Stay (HOSPITAL_COMMUNITY): Payer: BLUE CROSS/BLUE SHIELD

## 2018-11-08 ENCOUNTER — Encounter (HOSPITAL_COMMUNITY): Payer: Self-pay | Admitting: *Deleted

## 2018-11-08 ENCOUNTER — Telehealth: Payer: Self-pay | Admitting: Oncology

## 2018-11-08 ENCOUNTER — Encounter (HOSPITAL_COMMUNITY)
Admission: EM | Disposition: A | Discharge status: Home / Self Care | Payer: Self-pay | Source: Home / Self Care | Attending: Internal Medicine

## 2018-11-08 ENCOUNTER — Inpatient Hospital Stay (HOSPITAL_COMMUNITY): Payer: BLUE CROSS/BLUE SHIELD | Admitting: Certified Registered"

## 2018-11-08 DIAGNOSIS — K8309 Other cholangitis: Secondary | ICD-10-CM

## 2018-11-08 DIAGNOSIS — I340 Nonrheumatic mitral (valve) insufficiency: Secondary | ICD-10-CM

## 2018-11-08 DIAGNOSIS — R740 Nonspecific elevation of levels of transaminase and lactic acid dehydrogenase [LDH]: Secondary | ICD-10-CM

## 2018-11-08 DIAGNOSIS — R7881 Bacteremia: Secondary | ICD-10-CM

## 2018-11-08 DIAGNOSIS — A498 Other bacterial infections of unspecified site: Secondary | ICD-10-CM

## 2018-11-08 HISTORY — PX: TEE WITHOUT CARDIOVERSION: SHX5443

## 2018-11-08 SURGERY — ECHOCARDIOGRAM, TRANSESOPHAGEAL
Anesthesia: Monitor Anesthesia Care

## 2018-11-08 MED ORDER — DEXAMETHASONE SODIUM PHOSPHATE 10 MG/ML IJ SOLN
INTRAMUSCULAR | Status: DC | PRN
  Administered 2018-11-08: 4 mg via INTRAVENOUS

## 2018-11-08 MED ORDER — CEFTRIAXONE IV (FOR PTA / DISCHARGE USE ONLY): 2.0000 g | INTRAVENOUS | 0 refills | Status: DC

## 2018-11-08 MED ORDER — ACETAMINOPHEN 500 MG PO TABS: 500.0000 mg | ORAL_TABLET | Freq: Four times a day (QID) | ORAL | 0 refills | Status: DC | PRN

## 2018-11-08 MED ORDER — LIDOCAINE 2% (20 MG/ML) 5 ML SYRINGE
INTRAMUSCULAR | Status: DC | PRN
  Administered 2018-11-08: 100 mg via INTRAVENOUS

## 2018-11-08 MED ORDER — CIPROFLOXACIN HCL 500 MG PO TABS: 500.0000 mg | ORAL_TABLET | Freq: Two times a day (BID) | ORAL | Status: DC

## 2018-11-08 MED ORDER — SODIUM CHLORIDE 0.9 % IV SOLN
INTRAVENOUS | Status: DC
  Administered 2018-11-08: 12:00:00 via INTRAVENOUS

## 2018-11-08 MED ORDER — PROPOFOL 500 MG/50ML IV EMUL
INTRAVENOUS | Status: DC | PRN
  Administered 2018-11-08: 100 ug/kg/min via INTRAVENOUS

## 2018-11-08 MED ORDER — PROPOFOL 10 MG/ML IV BOLUS
INTRAVENOUS | Status: DC | PRN
  Administered 2018-11-08: 50 mg via INTRAVENOUS
  Administered 2018-11-08: 30 mg via INTRAVENOUS
  Administered 2018-11-08: 20 mg via INTRAVENOUS

## 2018-11-08 MED ORDER — ONDANSETRON HCL 4 MG/2ML IJ SOLN
INTRAMUSCULAR | Status: DC | PRN
  Administered 2018-11-08: 4 mg via INTRAVENOUS

## 2018-11-08 MED ORDER — FENTANYL CITRATE (PF) 100 MCG/2ML IJ SOLN
INTRAMUSCULAR | Status: AC
  Filled 2018-11-08: qty 2

## 2018-11-08 MED ORDER — CEFTRIAXONE IV (FOR PTA / DISCHARGE USE ONLY): 2.0000 g | INTRAVENOUS | 0 refills | Status: AC

## 2018-11-08 NOTE — Transfer of Care (Signed)
Immediate Anesthesia Transfer of Care Note  Patient: Cory Kelly  Procedure(s) Performed: TRANSESOPHAGEAL ECHOCARDIOGRAM (TEE) (N/A )  Patient Location: Endoscopy Unit  Anesthesia Type:MAC  Level of Consciousness: awake, alert  and oriented  Airway & Oxygen Therapy: Patient Spontanous Breathing and Patient connected to nasal cannula oxygen  Post-op Assessment: Report given to RN and Post -op Vital signs reviewed and stable  Post vital signs: Reviewed and stable  Last Vitals:  Vitals Value Taken Time  BP    Temp    Pulse    Resp    SpO2      Last Pain:  Vitals:   11/08/18 1215  TempSrc:   PainSc: 0-No pain      Patients Stated Pain Goal: 0 (75/88/32 5498)  Complications: No apparent anesthesia complications

## 2018-11-08 NOTE — Progress Notes (Signed)
PHARMACY CONSULT NOTE FOR:  OUTPATIENT  PARENTERAL ANTIBIOTIC THERAPY (OPAT)  Indication: Group C Strep Bacteremia  Regimen: Ceftriaxone 2 gm Q 24 hours  End date:  11/17/2018  IV antibiotic discharge orders are pended. To discharging provider:  please sign these orders via discharge navigator,  Select New Orders & click on the button choice - Manage This Unsigned Work.     Thank you for allowing pharmacy to be a part of this patient's care.  Jimmy Footman, PharmD, BCPS, [place] Clinical Pharmacist Phone: [phone] 11/08/2018, 10:34 AM

## 2018-11-08 NOTE — Progress Notes (Signed)
   Subjective: TEE was not performed yesterday because patient broke NPO. It has been rescheduled for late this morning. No overnight events. Mr. Mcmeen continues to feel well with no new concerns. All questions addressed. Updated wife via phone.  Objective:  Vital signs in last 24 hours: Vitals:   11/07/18 1708 11/07/18 1958 11/08/18 0025 11/08/18 0506  BP: (!) 158/96 (!) 143/93 130/75 (!) 141/88  Pulse: (!) 58 60 (!) 55 (!) 56  Resp: 16 18 18 20   Temp: 98.3 F (36.8 C) 98.3 F (36.8 C) 98.5 F (36.9 C) 98.4 F (36.9 C)  TempSrc: Oral Oral Oral Oral  SpO2: 97% 99% 97% 97%  Weight:      Height:       Gen: alert and oriented x3, no distress CV: RRR, no murmurs Abd: BS+, soft, non-distended, non-tender Ext: new bruise on left forearm, no lower extremity edema  Assessment/Plan:  Principal Problem:   Fever, unspecified Active Problems:   Elevated LFTs   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HCC)   Port-A-Cath in place   Episode of transient neurologic symptoms  Mr. Kearley is a 63 yo man with a medical history of metastatic pancreatic adenocarcinoma s/p indwelling biliary stent and currently undergoing chemotherapy, neuropathy, andHTNwho presented with fever to 103 and AMS.  Streptococcus Group C Bacteremia Transient Encephalopathy Elevated LFTs and hyperbilirubinemia -Currently afebrile,hemodynamically stable, and at baseline mentation. Leukocytosisresolved.Blood cx from 5/2 blood cx grew Group C Strep. Blood cx and urine cx from 5/3 were negative. - Th source is likely cholangitis given history of biliary stent and transaminitis on admission. Labs improved. No indication for ERCP per GI.  - ID recommends treating with IV ceftriaxone through 5/16 and maintaining port-a-cath. If TEE is negative, he can be set up for OPAT and discharged this afternoon Plan - Neurology following, appreciate recs - ID consulted, appreciate recs  - Continue ceftriaxone (day 5 of abx  total) - TEE today  Metastatic pancreatic adenocarcinoma:Will f/u with Dr. Benay Spice for chemotherapy planning after discharge. - Resume home gabapentin  - Continue home imodium prn  - Continue home zofran prn  - Continue home percocet 10-325mg  for abdominal pain  BJY:NWGN regimen includes olmesartan 40mg  daily and amlodipine 10mg  daily.  - Continue irbesartan and amlodipine  Dispo: Anticipated discharge in approximately 0-1 days pending TEE results.  , Andree Elk, MD 11/08/2018, 6:09 AM Pager: [phone]

## 2018-11-08 NOTE — Anesthesia Preprocedure Evaluation (Signed)
Anesthesia Evaluation  Patient identified by MRN, date of birth, ID band Patient awake    Reviewed: Allergy & Precautions, H&P , NPO status , Patient's Chart, lab work & pertinent test results  Airway Mallampati: II  TM Distance: >3 FB Neck ROM: Full    Dental no notable dental hx. (+) Teeth Intact, Dental Advisory Given   Pulmonary neg pulmonary ROS,    Pulmonary exam normal breath sounds clear to auscultation       Cardiovascular hypertension, Pt. on medications  Rhythm:Regular Rate:Normal     Neuro/Psych negative neurological ROS  negative psych ROS   GI/Hepatic negative GI ROS, Neg liver ROS,   Endo/Other  Pancreatic CA  Renal/GU negative Renal ROS  negative genitourinary   Musculoskeletal   Abdominal   Peds  Hematology negative hematology ROS (+)   Anesthesia Other Findings   Reproductive/Obstetrics negative OB ROS                             Anesthesia Physical Anesthesia Plan  ASA: III  Anesthesia Plan: MAC   Post-op Pain Management:    Induction: Intravenous  PONV Risk Score and Plan: 1 and Propofol infusion and Ondansetron  Airway Management Planned: Nasal Cannula  Additional Equipment:   Intra-op Plan:   Post-operative Plan:   Informed Consent: I have reviewed the patients History and Physical, chart, labs and discussed the procedure including the risks, benefits and alternatives for the proposed anesthesia with the patient or authorized representative who has indicated his/her understanding and acceptance.     Dental advisory given  Plan Discussed with: CRNA  Anesthesia Plan Comments:         Anesthesia Quick Evaluation

## 2018-11-08 NOTE — Anesthesia Postprocedure Evaluation (Signed)
Anesthesia Post Note  Patient: Cory Kelly  Procedure(s) Performed: TRANSESOPHAGEAL ECHOCARDIOGRAM (TEE) (N/A )     Patient location during evaluation: Endoscopy Anesthesia Type: MAC Level of consciousness: awake and alert Pain management: pain level controlled Vital Signs Assessment: post-procedure vital signs reviewed and stable Respiratory status: spontaneous breathing, nonlabored ventilation and respiratory function stable Cardiovascular status: stable and blood pressure returned to baseline Postop Assessment: no apparent nausea or vomiting Anesthetic complications: no    Last Vitals:  Vitals:   11/08/18 1253 11/08/18 1303  BP: 127/83 (!) 147/80  Pulse: 65 60  Resp: 15 16  Temp: 36.8 C   SpO2: 100% 100%    Last Pain:  Vitals:   11/08/18 1253  TempSrc: Oral  PainSc: 0-No pain                 Rosann Gorum,W. EDMOND

## 2018-11-08 NOTE — Anesthesia Procedure Notes (Signed)
Procedure Name: MAC Date/Time: 11/08/2018 12:26 PM Performed by: Teressa Lower., CRNA Pre-anesthesia Checklist: Patient identified, Emergency Drugs available, Suction available and Patient being monitored Patient Re-evaluated:Patient Re-evaluated prior to induction Oxygen Delivery Method: Nasal cannula

## 2018-11-08 NOTE — Progress Notes (Signed)
Echocardiogram 2D Echocardiogram has been performed.  Cory Kelly 11/08/2018, 1:00 PM

## 2018-11-08 NOTE — Telephone Encounter (Signed)
Scheduled appt per 5/07 sch message - unable to reach pt / left message with appt date and time

## 2018-11-08 NOTE — Progress Notes (Signed)
Pt is currently on ceftriaxone for strep bacteremia. He was on Cipro prior to his admission. TEE didn't show vegetation. Cipro was re-ordered. D/w Dr. Annie Paras and will dc that order and continue ceftriaxone.  Onnie Boer, PharmD, BCIDP, AAHIVP, CPP Infectious Disease Pharmacist 11/08/2018 3:17 PM

## 2018-11-08 NOTE — Interval H&P Note (Signed)
History and Physical Interval Note:  11/08/2018 11:59 AM  Cory Kelly  has presented today for surgery, with the diagnosis of bacteremia.  The various methods of treatment have been discussed with the patient and family. After consideration of risks, benefits and other options for treatment, the patient has consented to  Procedure(s): TRANSESOPHAGEAL ECHOCARDIOGRAM (TEE) (N/A) as a surgical intervention.  The patient's history has been reviewed, patient examined, no change in status, stable for surgery.  I have reviewed the patient's chart and labs.  Questions were answered to the patient's satisfaction.     [name]

## 2018-11-08 NOTE — CV Procedure (Signed)
    Transesophageal Echocardiogram Note  Cory Kelly [id number] 12/24/1955  Procedure: Transesophageal Echocardiogram Indications: bacteremia , fever   Procedure Details Consent: Obtained Time Out: Verified patient identification, verified procedure, site/side was marked, verified correct patient position, special equipment/implants available, Radiology Safety Procedures followed,  medications/allergies/relevent history reviewed, required imaging and test results available.  Performed  Medications:  During this procedure the patient is administered a  proporol drip by CRNA ,Cory Kelly.   Left Ventrical:  Normal LV function  Mitral Valve: mild MR ,  No vegetatin   Aortic Valve: normal , 3 leaflet valve , no vegetation   Tricuspid Valve: normal , no vegetation   Pulmonic Valve: normal ,  Trivial PI, no vegetation   Left Atrium/ Left atrial appendage: normal, no thrombus   Atrial septum: intact by color flow,   Aorta:  Mild calcification ,  No significant atheroma    Complications: No apparent complications Patient did tolerate procedure well.   Cory Kelly, Cory Kelly., MD, [facility] 11/08/2018, 12:56 PM

## 2018-11-09 DIAGNOSIS — R7881 Bacteremia: Secondary | ICD-10-CM | POA: Diagnosis not present

## 2018-11-09 LAB — CULTURE, BLOOD (ROUTINE X 2)
Culture: NO GROWTH
Culture: NO GROWTH
Culture: NO GROWTH
Special Requests: ADEQUATE
Special Requests: ADEQUATE

## 2018-11-12 ENCOUNTER — Encounter (HOSPITAL_COMMUNITY): Payer: Self-pay | Admitting: Cardiovascular Disease

## 2018-11-12 DIAGNOSIS — R509 Fever, unspecified: Secondary | ICD-10-CM | POA: Diagnosis not present

## 2018-11-14 ENCOUNTER — Inpatient Hospital Stay: Payer: BC Managed Care – PPO

## 2018-11-14 ENCOUNTER — Other Ambulatory Visit: Payer: Self-pay

## 2018-11-14 ENCOUNTER — Telehealth: Payer: Self-pay | Admitting: Nurse Practitioner

## 2018-11-14 ENCOUNTER — Inpatient Hospital Stay: Payer: BC Managed Care – PPO | Attending: Nurse Practitioner

## 2018-11-14 ENCOUNTER — Inpatient Hospital Stay (HOSPITAL_BASED_OUTPATIENT_CLINIC_OR_DEPARTMENT_OTHER): Payer: BC Managed Care – PPO | Admitting: Nurse Practitioner

## 2018-11-14 ENCOUNTER — Encounter: Payer: Self-pay | Admitting: Nurse Practitioner

## 2018-11-14 VITALS — BP 138/82 | HR 61 | Temp 98.0°F | Resp 18 | Ht 78.0 in | Wt 207.0 lb

## 2018-11-14 DIAGNOSIS — Z5189 Encounter for other specified aftercare: Secondary | ICD-10-CM | POA: Diagnosis not present

## 2018-11-14 DIAGNOSIS — Z5111 Encounter for antineoplastic chemotherapy: Secondary | ICD-10-CM | POA: Insufficient documentation

## 2018-11-14 DIAGNOSIS — R109 Unspecified abdominal pain: Secondary | ICD-10-CM

## 2018-11-14 DIAGNOSIS — C250 Malignant neoplasm of head of pancreas: Secondary | ICD-10-CM

## 2018-11-14 DIAGNOSIS — R202 Paresthesia of skin: Secondary | ICD-10-CM

## 2018-11-14 DIAGNOSIS — R911 Solitary pulmonary nodule: Secondary | ICD-10-CM | POA: Diagnosis not present

## 2018-11-14 DIAGNOSIS — R5383 Other fatigue: Secondary | ICD-10-CM

## 2018-11-14 DIAGNOSIS — R200 Anesthesia of skin: Secondary | ICD-10-CM

## 2018-11-14 DIAGNOSIS — C787 Secondary malignant neoplasm of liver and intrahepatic bile duct: Secondary | ICD-10-CM | POA: Diagnosis not present

## 2018-11-14 DIAGNOSIS — R748 Abnormal levels of other serum enzymes: Secondary | ICD-10-CM

## 2018-11-14 DIAGNOSIS — Z95828 Presence of other vascular implants and grafts: Secondary | ICD-10-CM

## 2018-11-14 DIAGNOSIS — I10 Essential (primary) hypertension: Secondary | ICD-10-CM | POA: Diagnosis not present

## 2018-11-14 LAB — CMP (CANCER CENTER ONLY)
ALT: 89 U/L — ABNORMAL HIGH (ref 0–44)
AST: 67 U/L — ABNORMAL HIGH (ref 15–41)
Albumin: 2.8 g/dL — ABNORMAL LOW (ref 3.5–5.0)
Alkaline Phosphatase: 743 U/L — ABNORMAL HIGH (ref 38–126)
Anion gap: 7 (ref 5–15)
BUN: 5 mg/dL — ABNORMAL LOW (ref 8–23)
CO2: 27 mmol/L (ref 22–32)
Calcium: 8.3 mg/dL — ABNORMAL LOW (ref 8.9–10.3)
Chloride: 101 mmol/L (ref 98–111)
Creatinine: 0.77 mg/dL (ref 0.61–1.24)
GFR, Est AFR Am: >60 mL/min
GFR, Estimated: >60 mL/min
Glucose, Bld: 196 mg/dL — ABNORMAL HIGH (ref 70–99)
Potassium: 3.8 mmol/L (ref 3.5–5.1)
Sodium: 135 mmol/L (ref 135–145)
Total Bilirubin: 0.5 mg/dL (ref 0.3–1.2)
Total Protein: 6 g/dL — ABNORMAL LOW (ref 6.5–8.1)

## 2018-11-14 LAB — CBC WITH DIFFERENTIAL (CANCER CENTER ONLY)
Abs Immature Granulocytes: 0.04 K/uL (ref 0.00–0.07)
Basophils Absolute: 0 K/uL (ref 0.0–0.1)
Basophils Relative: 1 %
Eosinophils Absolute: 0.1 K/uL (ref 0.0–0.5)
Eosinophils Relative: 2 %
HCT: 34.1 % — ABNORMAL LOW (ref 39.0–52.0)
Hemoglobin: 10.9 g/dL — ABNORMAL LOW (ref 13.0–17.0)
Immature Granulocytes: 1 %
Lymphocytes Relative: 7 %
Lymphs Abs: 0.3 K/uL — ABNORMAL LOW (ref 0.7–4.0)
MCH: 27.8 pg (ref 26.0–34.0)
MCHC: 32 g/dL (ref 30.0–36.0)
MCV: 87 fL (ref 80.0–100.0)
Monocytes Absolute: 0.3 K/uL (ref 0.1–1.0)
Monocytes Relative: 6 %
Neutro Abs: 3.8 K/uL (ref 1.7–7.7)
Neutrophils Relative %: 83 %
Platelet Count: 113 K/uL — ABNORMAL LOW (ref 150–400)
RBC: 3.92 MIL/uL — ABNORMAL LOW (ref 4.22–5.81)
RDW: 15.9 % — ABNORMAL HIGH (ref 11.5–15.5)
WBC Count: 4.5 K/uL (ref 4.0–10.5)
nRBC: 0 % (ref 0.0–0.2)

## 2018-11-14 MED ORDER — PALONOSETRON HCL INJECTION 0.25 MG/5ML
INTRAVENOUS | Status: AC
  Filled 2018-11-14: qty 5

## 2018-11-14 MED ORDER — ATROPINE SULFATE 1 MG/ML IJ SOLN
INTRAMUSCULAR | Status: AC
  Filled 2018-11-14: qty 1

## 2018-11-14 MED ORDER — SODIUM CHLORIDE 0.9 % IV SOLN
300.0000 mg/m2 | Freq: Once | INTRAVENOUS | Status: AC
  Administered 2018-11-14: 676 mg via INTRAVENOUS
  Filled 2018-11-14: qty 33.8

## 2018-11-14 MED ORDER — PALONOSETRON HCL INJECTION 0.25 MG/5ML
0.2500 mg | Freq: Once | INTRAVENOUS | Status: AC
  Administered 2018-11-14: 0.25 mg via INTRAVENOUS

## 2018-11-14 MED ORDER — SODIUM CHLORIDE 0.9% FLUSH
10.0000 mL | Freq: Once | INTRAVENOUS | Status: AC
  Administered 2018-11-14: 10 mL
  Filled 2018-11-14: qty 10

## 2018-11-14 MED ORDER — SODIUM CHLORIDE 0.9 % IV SOLN
Freq: Once | INTRAVENOUS | Status: AC
  Administered 2018-11-14: 13:00:00 via INTRAVENOUS
  Filled 2018-11-14: qty 5

## 2018-11-14 MED ORDER — DEXTROSE 5 % IV SOLN
Freq: Once | INTRAVENOUS | Status: AC
  Administered 2018-11-14: 13:00:00 via INTRAVENOUS
  Filled 2018-11-14: qty 250

## 2018-11-14 MED ORDER — ATROPINE SULFATE 1 MG/ML IJ SOLN
0.5000 mg | Freq: Once | INTRAMUSCULAR | Status: AC | PRN
  Administered 2018-11-14: 0.5 mg via INTRAVENOUS

## 2018-11-14 MED ORDER — SODIUM CHLORIDE 0.9 % IV SOLN
90.0000 mg/m2 | Freq: Once | INTRAVENOUS | Status: AC
  Administered 2018-11-14: 200 mg via INTRAVENOUS
  Filled 2018-11-14: qty 10

## 2018-11-14 MED ORDER — SODIUM CHLORIDE 0.9 % IV SOLN
1800.0000 mg/m2 | INTRAVENOUS | Status: DC
  Administered 2018-11-14: 4050 mg via INTRAVENOUS
  Filled 2018-11-14: qty 81

## 2018-11-14 MED ORDER — OXALIPLATIN CHEMO INJECTION 100 MG/20ML
66.0000 mg/m2 | Freq: Once | INTRAVENOUS | Status: AC
  Administered 2018-11-14: 150 mg via INTRAVENOUS
  Filled 2018-11-14: qty 10

## 2018-11-14 NOTE — Patient Instructions (Signed)
[facility] Discharge Instructions for Patients Receiving Chemotherapy  Today you received the following chemotherapy agents : Oxaliplatin, Camptosar, Leucovorin, Fluorouracil.  To help prevent nausea and vomiting after your treatment, we encourage you to take your nausea medication as prescribed.   If you develop nausea and vomiting that is not controlled by your nausea medication, call the clinic.   BELOW ARE SYMPTOMS THAT SHOULD BE REPORTED IMMEDIATELY:  *FEVER GREATER THAN 100.5 F  *CHILLS WITH OR WITHOUT FEVER  NAUSEA AND VOMITING THAT IS NOT CONTROLLED WITH YOUR NAUSEA MEDICATION  *UNUSUAL SHORTNESS OF BREATH  *UNUSUAL BRUISING OR BLEEDING  TENDERNESS IN MOUTH AND THROAT WITH OR WITHOUT PRESENCE OF ULCERS  *URINARY PROBLEMS  *BOWEL PROBLEMS  UNUSUAL RASH Items with * indicate a potential emergency and should be followed up as soon as possible.  Feel free to call the clinic should you have any questions or concerns. The clinic phone number is [phone].  Please show the [facility] at check-in to the Emergency Department and triage nurse.

## 2018-11-14 NOTE — Progress Notes (Addendum)
[facility] OFFICE PROGRESS NOTE   Diagnosis: Pancreas cancer  INTERVAL HISTORY:   Mr. Werber returns as scheduled.  He started feeling better over the past 3 days.  Appetite has improved.  He continues to be fatigued.  No fever or chills.  He has occasional abdominal pain.  No diarrhea.  No nausea.  Mild tingling in the fingertips.  Feet with persistent numbness.  The numbness occasionally affects balance.  Objective:  Vital signs in last 24 hours:  Blood pressure 138/82, pulse 61, temperature 98 F (36.7 C), temperature source Oral, resp. rate 18, height _0  (1.981 m), weight 207 lb (93.9 kg), SpO2 100 %.    No physical exam performed due to COVID-19 pandemic  Lab Results:  Lab Results  Component Value Date   WBC 4.5 11/14/2018   HGB 10.9 (L) 11/14/2018   HCT 34.1 (L) 11/14/2018   MCV 87.0 11/14/2018   PLT 113 (L) 11/14/2018   NEUTROABS 3.8 11/14/2018    Imaging:  No results found.  Medications: I have reviewed the patient's current medications.  Assessment/Plan: 1. Pancreas head mass  CT abdomen/pelvis 08/03/2017-fullness in the head of the pancreas with stranding in the peripancreatic fat and pancreatic ductal dilatation.   MRI abdomen 08/04/2017-findings favored to represent acute on chronic pancreatitis; equivocal soft tissue fullness within the head and uncinate process of the pancreas; indeterminate too small to characterize right hepatic lobe lesion; small volume abdominal ascites; splenomegaly.   CA-19-9 elevated at 977on 08/04/2017.  Status postupper EUS 08/10/2017-findings of an irregular masslike region in the pancreatic head measuring approximately 2.7 cm. This directly abutted the main portal vein but no other major vascular structures. Biopsies obtained with preliminary cytology positive for malignancy, likely adenocarcinoma. The final report is pending. The common bile duct and main pancreatic duct were both dilated. ERCP was then  proceeded with forstent placement. Multiple attempts were made tocannulate the bile duct without success. The procedure was aborted.  08/16/2017 ERCP with placement of a metal biliary stent in the common bile duct by Dr. Francella Solian at [facility].  Cycle 1 FOLFIRINOX 08/31/2017  Cycle 2 FOLFIRINOX 09/14/2017  Cycle 3 FOLFIRINOX 09/28/2017  Cycle 4 FOLFIRINOX 10/12/2017 (oxaliplatin dose reduced secondary to thrombocytopenia)  Cycle 5 FOLFIRINOX 10/26/2017  Cycle 6 FOLFIRINOX 11/09/2017  Restaging CTs at [facility] 11/29/2017-no definitive evidence of distant metastatic disease. 2 subcentimeter liver lesions described on prior MRI not visualized on CT. Ill-defined pancreatic head mass stable to decreased in size measuring 2.1 x 2 cm. Peripancreatic inflammatory stranding decreased from prior. No biliary ductal dilatation. Celiac axis less than 180 degrees abutment. Common hepatic artery with greater than 180 degrees encasement; superior mesenteric artery with short segment less than 180 degrees abutment; portal vein/superior mesenteric vein with greater than 180 degrees encasement of the extrahepatic portal vein with associated circumferential narrowing at the portomesenteric, overall substantially improved from prior examination. Now less than 180 degree abutment of the SMV. The portal vein and SMV remain patent. Splenic vein patent.  Cycle 1 gemcitabine/Abraxane 12/06/2017  Cycle 2 gemcitabine/Abraxane 12/20/2017  Cycle 3 gemcitabine/Abraxane 01/03/2018  Cycle 4 gemcitabine/Abraxane 01/17/2018  Cycle 5 gemcitabine 01/31/2018 (Abraxane held due to neuropathy)  Cycle 6 gemcitabine 02/14/2018 (Abraxane held due to neuropathy)  Cycle 7 gemcitabine 02/28/2018 (Abraxane held due to neuropathy)  CT chest/abdomen/pelvis at [facility] 03/08/2018- stable appearance of previously identified infiltrating pancreatic head mass. No CT evidence of metastatic disease.  SBRT to the pancreas 04/05/2018-04/11/2018  05/09/2018  CA-19-9 2138   MRI  abdomen 05/09/2018- similar 6 mm hypoenhancing focus posterior right lobe liver; more superior lesion near the dome of the liver not identified on the postcontrast imaging but there is a persistent focus of diffusion signal abnormality in this region; new 8 mm hypoenhancing focus located just superior to the gallbladder; additional tiny focus of diffusion abnormality located more superiorly without obvious associated abnormal enhancement. Ill-defined pancreatic head mass not felt to be significantly changed.  CT chest/abdomen/pelvis 05/10/2018- ill-defined hypoattenuating pancreatic head mass and ill-defined soft tissue abutting the celiac axis with mildly increased dilatation of the main pancreatic duct. New soft tissue nodule along the right upper lobe bronchus; hypoattenuating liver lesions concerning for metastasis.  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 The stent traverses the pancreatic mass and extends into the duodenum. Increase in size of 2 liver lesions. Pancreatic mass appears similar. Indistinct tissue planes around the duodenum along with wall thickening in the gallbladder and especially the gallbladder neck.  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 Hypertension 3. Port-A-Cath placement 08/30/2017 4. History of elevated bilirubin-questionGilbert's syndrome 5. Mild leukopenia, thrombocytopenia-question secondary to portal hypertension/splenomegaly;09/14/2017 white count improved, platelet count stable;progressive thrombocytopenia following gemcitabine/Abraxane 6. Kidney stones 7. Fever following cycles 2 and 3 gemcitabine/Abraxane,? Fever related to gemcitabine 8.Jaundice, elevated liver enzymes and bilirubin 06/22/2018; status post ERCP 06/25/2018 with debris removed from the existing stent and a new plastic stent placed. Recurrent jaundice 07/19/2018 status post ERCP 07/20/2018 with finding that the previously placed plastic stent had migrated distally. There was debris and sludge on the stent. The previously placed stent was removed. 5 to 8 mm stricture just proximal to the existing SEMS. 2 plastic stents were placed.ERCP 08/16/2018- 2 plastic biliary stents were removed from within the previously placed partially covered SEMS; stricture  confirmed just proximal to the previously placed SEMS, treated with placement of an 8 cm long 10 mm diameter uncovered SEMS in good position. 9.   Hospitalized with bacteremia Streptococcus group C 11/04/2018 through 11/08/2018.  IV ceftriaxone through 11/17/2018.  TEE 11/08/2018 negative.   Disposition: Mr. Gervacio appears stable.  He has completed 5  cycles of FOLFIRINOX.  MRI abdomen 11/06/2018 showed improvement.  Plan to proceed with cycle 6 today as scheduled.  We reviewed the CBC from today.  Counts are adequate to proceed with treatment.  Liver enzymes remain elevated.  Bilirubin is within normal limits.  He will return for lab, follow-up and the next cycle of chemotherapy in 3 weeks.  He will contact the office in the interim with any problems.  Patient seen with Dr. Benay Spice.    Ned Card ANP/GNP-BC   11/14/2018  11:21 AM This was a shared visit with Ned Card.  Mr. Adamczak appears well.  He has recovered from the recent admission with bacteremia.  The bacteremia was likely related to the biliary tract. The plan is to continue FOLFIRINOX  The restaging MRI on 11/06/2018 revealed a decrease in the metastatic liver lesions.  Julieanne Manson, MD

## 2018-11-14 NOTE — Telephone Encounter (Signed)
Scheduled appt per 5/13 los. °

## 2018-11-14 NOTE — Progress Notes (Signed)
Per Ned Card, NP, ok to treat with ALT 89.

## 2018-11-15 LAB — CANCER ANTIGEN 19-9: CA 19-9: 770 U/mL — ABNORMAL HIGH (ref 0–35)

## 2018-11-16 ENCOUNTER — Other Ambulatory Visit: Payer: Self-pay

## 2018-11-16 ENCOUNTER — Inpatient Hospital Stay: Payer: BC Managed Care – PPO

## 2018-11-16 VITALS — BP 115/78 | HR 55 | Temp 99.0°F | Resp 18 | Wt 205.6 lb

## 2018-11-16 DIAGNOSIS — Z5111 Encounter for antineoplastic chemotherapy: Secondary | ICD-10-CM | POA: Diagnosis not present

## 2018-11-16 DIAGNOSIS — C250 Malignant neoplasm of head of pancreas: Secondary | ICD-10-CM | POA: Diagnosis not present

## 2018-11-16 DIAGNOSIS — Z5189 Encounter for other specified aftercare: Secondary | ICD-10-CM | POA: Diagnosis not present

## 2018-11-16 DIAGNOSIS — C787 Secondary malignant neoplasm of liver and intrahepatic bile duct: Secondary | ICD-10-CM | POA: Diagnosis not present

## 2018-11-16 MED ORDER — HEPARIN SOD (PORK) LOCK FLUSH 100 UNIT/ML IV SOLN
500.0000 [IU] | Freq: Once | INTRAVENOUS | Status: DC | PRN
  Filled 2018-11-16: qty 5

## 2018-11-16 MED ORDER — SODIUM CHLORIDE 0.9% FLUSH
10.0000 mL | INTRAVENOUS | Status: DC | PRN
  Administered 2018-11-16: 10 mL
  Filled 2018-11-16: qty 10

## 2018-11-16 MED ORDER — PEGFILGRASTIM-CBQV 6 MG/0.6ML ~~LOC~~ SOSY
6.0000 mg | PREFILLED_SYRINGE | Freq: Once | SUBCUTANEOUS | Status: AC
  Administered 2018-11-16: 6 mg via SUBCUTANEOUS

## 2018-11-16 MED ORDER — PEGFILGRASTIM-CBQV 6 MG/0.6ML ~~LOC~~ SOSY
PREFILLED_SYRINGE | SUBCUTANEOUS | Status: AC
  Filled 2018-11-16: qty 0.6

## 2018-11-16 NOTE — Patient Instructions (Signed)
Pegfilgrastim injection  What is this medicine?  PEGFILGRASTIM (PEG fil gra stim) is a long-acting granulocyte colony-stimulating factor that stimulates the growth of neutrophils, a type of white blood cell important in the body's fight against infection. It is used to reduce the incidence of fever and infection in patients with certain types of cancer who are receiving chemotherapy that affects the bone marrow, and to increase survival after being exposed to high doses of radiation.  This medicine may be used for other purposes; ask your health care provider or pharmacist if you have questions.  COMMON BRAND NAME(S): Fulphila, Neulasta, UDENYCA  What should I tell my health care provider before I take this medicine?  They need to know if you have any of these conditions:  -kidney disease  -latex allergy  -ongoing radiation therapy  -sickle cell disease  -skin reactions to acrylic adhesives (On-Body Injector only)  -an unusual or allergic reaction to pegfilgrastim, filgrastim, other medicines, foods, dyes, or preservatives  -pregnant or trying to get pregnant  -breast-feeding  How should I use this medicine?  This medicine is for injection under the skin. If you get this medicine at home, you will be taught how to prepare and give the pre-filled syringe or how to use the On-body Injector. Refer to the patient Instructions for Use for detailed instructions. Use exactly as directed. Tell your healthcare provider immediately if you suspect that the On-body Injector may not have performed as intended or if you suspect the use of the On-body Injector resulted in a missed or partial dose.  It is important that you put your used needles and syringes in a special sharps container. Do not put them in a trash can. If you do not have a sharps container, call your pharmacist or healthcare provider to get one.  Talk to your pediatrician regarding the use of this medicine in children. While this drug may be prescribed for  selected conditions, precautions do apply.  Overdosage: If you think you have taken too much of this medicine contact a poison control center or emergency room at once.  NOTE: This medicine is only for you. Do not share this medicine with others.  What if I miss a dose?  It is important not to miss your dose. Call your doctor or health care professional if you miss your dose. If you miss a dose due to an On-body Injector failure or leakage, a new dose should be administered as soon as possible using a single prefilled syringe for manual use.  What may interact with this medicine?  Interactions have not been studied.  Give your health care provider a list of all the medicines, herbs, non-prescription drugs, or dietary supplements you use. Also tell them if you smoke, drink alcohol, or use illegal drugs. Some items may interact with your medicine.  This list may not describe all possible interactions. Give your health care provider a list of all the medicines, herbs, non-prescription drugs, or dietary supplements you use. Also tell them if you smoke, drink alcohol, or use illegal drugs. Some items may interact with your medicine.  What should I watch for while using this medicine?  You may need blood work done while you are taking this medicine.  If you are going to need a MRI, CT scan, or other procedure, tell your doctor that you are using this medicine (On-Body Injector only).  What side effects may I notice from receiving this medicine?  Side effects that you should report to   your doctor or health care professional as soon as possible:  -allergic reactions like skin rash, itching or hives, swelling of the face, lips, or tongue  -back pain  -dizziness  -fever  -pain, redness, or irritation at site where injected  -pinpoint red spots on the skin  -red or dark-brown urine  -shortness of breath or breathing problems  -stomach or side pain, or pain at the shoulder  -swelling  -tiredness  -trouble passing urine or  change in the amount of urine  Side effects that usually do not require medical attention (report to your doctor or health care professional if they continue or are bothersome):  -bone pain  -muscle pain  This list may not describe all possible side effects. Call your doctor for medical advice about side effects. You may report side effects to FDA at 1-800-FDA-1088.  Where should I keep my medicine?  Keep out of the reach of children.  If you are using this medicine at home, you will be instructed on how to store it. Throw away any unused medicine after the expiration date on the label.  NOTE: This sheet is a summary. It may not cover all possible information. If you have questions about this medicine, talk to your doctor, pharmacist, or health care provider.   2019 [employer] (2017-09-25 16:57:08)

## 2018-11-16 NOTE — Progress Notes (Signed)
Per Eliezer Lofts in pharmacy ok to give udenyca while pt is receiving IV abx at home through his port for recent infection.  Pt's port left accessed per pt request for home IV abx use.  Dressing changed and antimicrobial disc applied d/t old dressing falling off in clinic.  Saline locked.

## 2018-11-19 DIAGNOSIS — D6481 Anemia due to antineoplastic chemotherapy: Secondary | ICD-10-CM | POA: Diagnosis not present

## 2018-11-19 DIAGNOSIS — K8309 Other cholangitis: Secondary | ICD-10-CM | POA: Diagnosis not present

## 2018-11-19 DIAGNOSIS — C250 Malignant neoplasm of head of pancreas: Secondary | ICD-10-CM | POA: Diagnosis not present

## 2018-11-19 DIAGNOSIS — R7881 Bacteremia: Secondary | ICD-10-CM | POA: Diagnosis not present

## 2018-11-19 DIAGNOSIS — C259 Malignant neoplasm of pancreas, unspecified: Secondary | ICD-10-CM | POA: Diagnosis not present

## 2018-11-28 ENCOUNTER — Telehealth: Payer: Self-pay | Admitting: *Deleted

## 2018-11-28 NOTE — Telephone Encounter (Signed)
"  Cory Kelly's wife Cory Kelly.  His stomach is swollen and uncomfortable.  Hurt the worst last night so he had to take a Percocet.  Is this normal or does he need to be seen?"  "My abdomen is a solid rock, looks like I'm pregnant. Pain comes and goes.  Equals seven on pain scale decreased to a four after Percocet.  Constantly feels tight and full like I've eaten a big meal.   No shortness of breath.   I would think I'd feel better having been to the bathroom quite a bit to urinate and do my job but no relief. Some diarrhea feel I need to go now.  Bowels moved five or six times last night."

## 2018-11-28 NOTE — Telephone Encounter (Signed)
Called patient to offer appointment this afternoon with NP or with Dr. Benay Spice at [place] [facility] tomorrow. He chose tomorrow

## 2018-11-29 ENCOUNTER — Ambulatory Visit (HOSPITAL_COMMUNITY)
Admission: RE | Admit: 2018-11-29 | Discharge: 2018-11-29 | Disposition: A | Discharge status: Home / Self Care | Payer: BC Managed Care – PPO | Source: Ambulatory Visit | Attending: Nurse Practitioner | Admitting: Nurse Practitioner

## 2018-11-29 ENCOUNTER — Telehealth: Payer: Self-pay

## 2018-11-29 ENCOUNTER — Other Ambulatory Visit: Payer: Self-pay

## 2018-11-29 ENCOUNTER — Other Ambulatory Visit: Payer: Self-pay | Admitting: Nurse Practitioner

## 2018-11-29 ENCOUNTER — Encounter: Payer: Self-pay | Admitting: Nurse Practitioner

## 2018-11-29 ENCOUNTER — Inpatient Hospital Stay (HOSPITAL_BASED_OUTPATIENT_CLINIC_OR_DEPARTMENT_OTHER): Payer: BC Managed Care – PPO | Admitting: Nurse Practitioner

## 2018-11-29 VITALS — BP 111/69 | HR 67 | Temp 98.3°F | Resp 16 | Ht 78.0 in | Wt 207.5 lb

## 2018-11-29 DIAGNOSIS — Z5189 Encounter for other specified aftercare: Secondary | ICD-10-CM | POA: Diagnosis not present

## 2018-11-29 DIAGNOSIS — C250 Malignant neoplasm of head of pancreas: Secondary | ICD-10-CM

## 2018-11-29 DIAGNOSIS — R180 Malignant ascites: Secondary | ICD-10-CM | POA: Diagnosis not present

## 2018-11-29 DIAGNOSIS — R140 Abdominal distension (gaseous): Secondary | ICD-10-CM

## 2018-11-29 DIAGNOSIS — R911 Solitary pulmonary nodule: Secondary | ICD-10-CM

## 2018-11-29 DIAGNOSIS — R21 Rash and other nonspecific skin eruption: Secondary | ICD-10-CM

## 2018-11-29 DIAGNOSIS — C787 Secondary malignant neoplasm of liver and intrahepatic bile duct: Secondary | ICD-10-CM | POA: Diagnosis not present

## 2018-11-29 DIAGNOSIS — C259 Malignant neoplasm of pancreas, unspecified: Secondary | ICD-10-CM | POA: Diagnosis not present

## 2018-11-29 DIAGNOSIS — I10 Essential (primary) hypertension: Secondary | ICD-10-CM | POA: Diagnosis not present

## 2018-11-29 DIAGNOSIS — Z5111 Encounter for antineoplastic chemotherapy: Secondary | ICD-10-CM | POA: Diagnosis not present

## 2018-11-29 MED ORDER — FLUCONAZOLE 100 MG PO TABS: 100.0000 mg | ORAL_TABLET | Freq: Every day | ORAL | 0 refills | Status: DC

## 2018-11-29 NOTE — Telephone Encounter (Signed)
Per Patrica Duel to [facility] scheduling [phone]) to schedule a paracentesis for patient today. Appointment is scheduled for [place]ady at [facility] and patient is headed there now.

## 2018-11-29 NOTE — Telephone Encounter (Signed)
Received voicemail from Major Santerre (wife) stating that Mr. Katich was unable to get his paracenteses done due to them not being able to find any fluid. She also stated that she would like for Lattie Haw to give them a call back because they would like to know what this means. Lattie Haw aware of their concern and stated that she would give them a call back.

## 2018-11-29 NOTE — Progress Notes (Addendum)
[place] OFFICE PROGRESS NOTE   Diagnosis:  Pancreas cancer   INTERVAL HISTORY:   Mr. Brauer returns prior to scheduled follow-up for evaluation of abdominal distention.  He completed cycle 6 FOLFIRINOX 11/14/2018.  For the past 2 weeks he has noted abdominal distention.  The abdomen feels "tight".  The distention is not relieved with a bowel movement.  He has occasional sharp pain at the mid abdomen.  The pain resolves spontaneously.  No nausea or vomiting.  No fever.  Stools are semi-formed, not watery.  He is taking Imodium, last dose yesterday.  He is experiencing some urgency, sensation of incomplete emptying.  No bleeding.  Objective:  Vital signs in last 24 hours:  Blood pressure 111/69, pulse 67, temperature 98.3 F (36.8 C), temperature source Tympanic, resp. rate 16, height '6\' 6"'$  (1.981 m), weight 207 lb 8 oz (94.1 kg), SpO2 100 %.    GI: Abdomen is distended, question ascites.  Nontender.  No hepatomegaly. Vascular: No leg edema.  Skin: Erythematous rash mid to upper gluteal fold extending to the buttocks, well-demarcated. Port-A-Cath without erythema.   Lab Results:  Lab Results  Component Value Date   WBC 4.5 11/14/2018   HGB 10.9 (L) 11/14/2018   HCT 34.1 (L) 11/14/2018   MCV 87.0 11/14/2018   PLT 113 (L) 11/14/2018   NEUTROABS 3.8 11/14/2018    Imaging:  No results found.  Medications: I have reviewed the patient's current medications.  Assessment/Plan: 1. Pancreas head mass  CT abdomen/pelvis 08/03/2017-fullness in the head of the pancreas with stranding in the peripancreatic fat and pancreatic ductal dilatation.   MRI abdomen 08/04/2017-findings favored to represent acute on chronic pancreatitis; equivocal soft tissue fullness within the head and uncinate process of the pancreas; indeterminate too small to characterize right hepatic lobe lesion; small volume abdominal ascites; splenomegaly.   CA-19-9 elevated at 977on 08/04/2017.   Status postupper EUS 08/10/2017-findings of an irregular masslike region in the pancreatic head measuring approximately 2.7 cm. This directly abutted the main portal vein but no other major vascular structures. Biopsies obtained with preliminary cytology positive for malignancy, likely adenocarcinoma. The final report is pending. The common bile duct and main pancreatic duct were both dilated. ERCP was then proceeded with forstent placement. Multiple attempts were made tocannulate the bile duct without success. The procedure was aborted.  08/16/2017 ERCP with placement of a metal biliary stent in the common bile duct by Dr. Francella Solian at [facility].  Cycle 1 FOLFIRINOX 08/31/2017  Cycle 2 FOLFIRINOX 09/14/2017  Cycle 3 FOLFIRINOX 09/28/2017  Cycle 4 FOLFIRINOX 10/12/2017 (oxaliplatin dose reduced secondary to thrombocytopenia)  Cycle 5 FOLFIRINOX 10/26/2017  Cycle 6 FOLFIRINOX 11/09/2017  Restaging CTs at [facility] 11/29/2017-no definitive evidence of distant metastatic disease. 2 subcentimeter liver lesions described on prior MRI not visualized on CT. Ill-defined pancreatic head mass stable to decreased in size measuring 2.1 x 2 cm. Peripancreatic inflammatory stranding decreased from prior. No biliary ductal dilatation. Celiac axis less than 180 degrees abutment. Common hepatic artery with greater than 180 degrees encasement; superior mesenteric artery with short segment less than 180 degrees abutment; portal vein/superior mesenteric vein with greater than 180 degrees encasement of the extrahepatic portal vein with associated circumferential narrowing at the portomesenteric, overall substantially improved from prior examination. Now less than 180 degree abutment of the SMV. The portal vein and SMV remain patent. Splenic vein patent.  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 No CT evidence of metastatic disease.  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 Ill-defined pancreatic head mass not felt to be significantly changed.  CT chest/abdomen/pelvis 05/10/2018- ill-defined hypoattenuating pancreatic head mass and ill-defined soft tissue abutting the celiac axis with mildly increased dilatation of the main pancreatic duct. New soft tissue nodule along the right upper lobe bronchus; hypoattenuating liver lesions concerning for metastasis.  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 The stent traverses the pancreatic mass and extends into the duodenum. Increase in size of 2 liver lesions. Pancreatic mass appears similar. Indistinct tissue planes around the duodenum along with wall thickening in the gallbladder and especially the gallbladder neck.   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 Hypertension 3. Port-A-Cath placement 08/30/2017 4. History of elevated bilirubin-questionGilbert's syndrome 5. Mild leukopenia, thrombocytopenia-question secondary to portal hypertension/splenomegaly;09/14/2017 white count improved, platelet count stable;progressive thrombocytopenia following gemcitabine/Abraxane 6. Kidney stones 7. Fever following cycles 2 and 3 gemcitabine/Abraxane,? Fever related to gemcitabine 8.Jaundice, elevated liver enzymes and bilirubin 06/22/2018; status post ERCP 06/25/2018 with debris removed from the existing stent and a new plastic stent placed. Recurrent jaundice  07/19/2018 status post ERCP 07/20/2018 with finding that the previously placed plastic stent had migrated distally. There was debris and sludge on the stent. The previously placed stent was removed. 5 to 8 mm stricture just proximal to the existing SEMS. 2 plastic stents were placed.ERCP 08/16/2018- 2 plastic biliary stents were removed from within the  previously placed partially covered SEMS; stricture confirmed just proximal to the previously placed SEMS, treated with placement of an 8 cm long 10 mm diameter uncovered SEMS in good position. 9.   Hospitalized with bacteremia Streptococcus group C 11/04/2018 through 11/08/2018.  IV ceftriaxone through 11/17/2018.  TEE 11/08/2018 negative.   Disposition: Cory Kelly appears stable.  He completed cycle 6 FOLFIRINOX 11/14/2018.  He presents today with progressive abdominal distention.  I am referring him for a paracentesis with fluid to be sent for routine culture and cytology. If there is not a significant amount of fluid present, he will begin a bowel regimen.   The rash at the upper gluteal fold/buttocks appears fungal.  He will keep the area clean and dry.  I am sending a prescription to his pharmacy for Diflucan 100 mg daily for 5 days.    Ned Card ANP/GNP-BC   11/29/2018  11:41 AM

## 2018-11-30 ENCOUNTER — Telehealth: Payer: Self-pay | Admitting: Nurse Practitioner

## 2018-11-30 NOTE — Telephone Encounter (Signed)
No los per 5/28. °

## 2018-12-02 ENCOUNTER — Other Ambulatory Visit: Payer: Self-pay | Admitting: Oncology

## 2018-12-03 ENCOUNTER — Telehealth: Payer: Self-pay

## 2018-12-03 ENCOUNTER — Inpatient Hospital Stay: Payer: BC Managed Care – PPO

## 2018-12-03 ENCOUNTER — Other Ambulatory Visit: Payer: Self-pay | Admitting: Gastroenterology

## 2018-12-03 ENCOUNTER — Other Ambulatory Visit: Payer: Self-pay

## 2018-12-03 ENCOUNTER — Inpatient Hospital Stay: Payer: BC Managed Care – PPO | Attending: Nurse Practitioner

## 2018-12-03 ENCOUNTER — Encounter: Payer: Self-pay | Admitting: Nurse Practitioner

## 2018-12-03 ENCOUNTER — Inpatient Hospital Stay (HOSPITAL_BASED_OUTPATIENT_CLINIC_OR_DEPARTMENT_OTHER): Payer: BC Managed Care – PPO | Admitting: Nurse Practitioner

## 2018-12-03 ENCOUNTER — Encounter: Payer: Self-pay | Admitting: Gastroenterology

## 2018-12-03 ENCOUNTER — Other Ambulatory Visit (HOSPITAL_COMMUNITY)
Admission: RE | Admit: 2018-12-03 | Discharge: 2018-12-03 | Disposition: A | Discharge status: Home / Self Care | Payer: BC Managed Care – PPO | Source: Ambulatory Visit | Attending: Gastroenterology | Admitting: Gastroenterology

## 2018-12-03 VITALS — BP 123/83 | HR 63 | Temp 98.5°F | Resp 18 | Ht 78.0 in | Wt 208.1 lb

## 2018-12-03 DIAGNOSIS — C787 Secondary malignant neoplasm of liver and intrahepatic bile duct: Secondary | ICD-10-CM

## 2018-12-03 DIAGNOSIS — C250 Malignant neoplasm of head of pancreas: Secondary | ICD-10-CM

## 2018-12-03 DIAGNOSIS — Z95828 Presence of other vascular implants and grafts: Secondary | ICD-10-CM

## 2018-12-03 DIAGNOSIS — R945 Abnormal results of liver function studies: Secondary | ICD-10-CM | POA: Insufficient documentation

## 2018-12-03 DIAGNOSIS — I10 Essential (primary) hypertension: Secondary | ICD-10-CM

## 2018-12-03 DIAGNOSIS — Z5111 Encounter for antineoplastic chemotherapy: Secondary | ICD-10-CM | POA: Insufficient documentation

## 2018-12-03 DIAGNOSIS — R140 Abdominal distension (gaseous): Secondary | ICD-10-CM

## 2018-12-03 DIAGNOSIS — R911 Solitary pulmonary nodule: Secondary | ICD-10-CM

## 2018-12-03 DIAGNOSIS — C259 Malignant neoplasm of pancreas, unspecified: Secondary | ICD-10-CM

## 2018-12-03 DIAGNOSIS — Z5189 Encounter for other specified aftercare: Secondary | ICD-10-CM | POA: Insufficient documentation

## 2018-12-03 DIAGNOSIS — Z1159 Encounter for screening for other viral diseases: Secondary | ICD-10-CM | POA: Insufficient documentation

## 2018-12-03 DIAGNOSIS — R109 Unspecified abdominal pain: Secondary | ICD-10-CM

## 2018-12-03 LAB — CBC WITH DIFFERENTIAL (CANCER CENTER ONLY)
Abs Immature Granulocytes: 0.03 10*3/uL (ref 0.00–0.07)
Basophils Absolute: 0 10*3/uL (ref 0.0–0.1)
Basophils Relative: 1 %
Eosinophils Absolute: 0.1 10*3/uL (ref 0.0–0.5)
Eosinophils Relative: 2 %
HCT: 33.6 % — ABNORMAL LOW (ref 39.0–52.0)
Hemoglobin: 10.8 g/dL — ABNORMAL LOW (ref 13.0–17.0)
Immature Granulocytes: 1 %
Lymphocytes Relative: 7 %
Lymphs Abs: 0.3 10*3/uL — ABNORMAL LOW (ref 0.7–4.0)
MCH: 28.1 pg (ref 26.0–34.0)
MCHC: 32.1 g/dL (ref 30.0–36.0)
MCV: 87.3 fL (ref 80.0–100.0)
Monocytes Absolute: 0.3 10*3/uL (ref 0.1–1.0)
Monocytes Relative: 9 %
Neutro Abs: 3.1 10*3/uL (ref 1.7–7.7)
Neutrophils Relative %: 80 %
Platelet Count: 95 10*3/uL — ABNORMAL LOW (ref 150–400)
RBC: 3.85 MIL/uL — ABNORMAL LOW (ref 4.22–5.81)
RDW: 15.5 % (ref 11.5–15.5)
WBC Count: 3.8 10*3/uL — ABNORMAL LOW (ref 4.0–10.5)
nRBC: 0 % (ref 0.0–0.2)

## 2018-12-03 LAB — CMP (CANCER CENTER ONLY)
ALT: 367 U/L (ref 0–44)
AST: 276 U/L (ref 15–41)
Albumin: 2.8 g/dL — ABNORMAL LOW (ref 3.5–5.0)
Alkaline Phosphatase: 1261 U/L — ABNORMAL HIGH (ref 38–126)
Anion gap: 9 (ref 5–15)
BUN: <4 mg/dL — ABNORMAL LOW (ref 8–23)
CO2: 26 mmol/L (ref 22–32)
Calcium: 8.2 mg/dL — ABNORMAL LOW (ref 8.9–10.3)
Chloride: 100 mmol/L (ref 98–111)
Creatinine: 0.76 mg/dL (ref 0.61–1.24)
GFR, Est AFR Am: >60 mL/min (ref >60)
GFR, Estimated: >60 mL/min (ref >60)
Glucose, Bld: 206 mg/dL — ABNORMAL HIGH (ref 70–99)
Potassium: 3.6 mmol/L (ref 3.5–5.1)
Sodium: 135 mmol/L (ref 135–145)
Total Bilirubin: 0.9 mg/dL (ref 0.3–1.2)
Total Protein: 5.7 g/dL — ABNORMAL LOW (ref 6.5–8.1)

## 2018-12-03 MED ORDER — GABAPENTIN 100 MG PO CAPS: 200.0000 mg | ORAL_CAPSULE | Freq: Two times a day (BID) | ORAL | 1 refills | Status: DC

## 2018-12-03 MED ORDER — HEPARIN SOD (PORK) LOCK FLUSH 100 UNIT/ML IV SOLN
500.0000 [IU] | Freq: Once | INTRAVENOUS | Status: AC
  Administered 2018-12-03: 500 [IU]
  Filled 2018-12-03: qty 5

## 2018-12-03 MED ORDER — SODIUM CHLORIDE 0.9% FLUSH
10.0000 mL | Freq: Once | INTRAVENOUS | Status: AC
  Administered 2018-12-03: 10 mL
  Filled 2018-12-03: qty 10

## 2018-12-03 MED ORDER — OXYCODONE-ACETAMINOPHEN 10-325 MG PO TABS: 1.0000 | ORAL_TABLET | ORAL | 0 refills | Status: DC | PRN

## 2018-12-03 NOTE — Telephone Encounter (Signed)
TC to infusion per Dr Benay Spice. Spoke with Clinical cytogeneticist and let her know that Dr. Benay Spice would like for nurse to hold treatment right now due to abnormal labs and that he will let her know when ok to treat. Patient is currently waiting in [facility] room.

## 2018-12-03 NOTE — Progress Notes (Addendum)
[place] OFFICE PROGRESS NOTE   Diagnosis: Pancreas cancer  INTERVAL HISTORY:   Mr. Lightsey returns as scheduled.  He completed cycle 6 FOLFIRINOX 11/14/2018.  He denies nausea/vomiting.  No mouth sores.  He intermittently notes lips are peeling.  Abdomen overall feels better. He continues to note some bloating.  Bowels have been moving regularly over the past few days since beginning MiraLAX.  He has occasional abdominal pain.  He describes appetite as "okay".  He notes increased tingling in the fingertips.  Numbness in feet unchanged.  Symptoms do not interfere with activity.  Objective:  Vital signs in last 24 hours:  Blood pressure 123/83, pulse 63, temperature 98.5 F (36.9 C), temperature source Oral, resp. rate 18, height 6' 6" (1.981 m), weight 208 lb 1.6 oz (94.4 kg), SpO2 100 %.    GI: Abdomen mildly distended.  No hepatomegaly.  No definite ascites. Vascular: Trace edema at the ankles bilaterally. Neuro: Vibratory sense mildly decreased over the fingertips per tuning fork exam. Skin: Palms without erythema. Port-A-Cath without erythema.   Lab Results:  Lab Results  Component Value Date   WBC 3.8 (L) 12/03/2018   HGB 10.8 (L) 12/03/2018   HCT 33.6 (L) 12/03/2018   MCV 87.3 12/03/2018   PLT 95 (L) 12/03/2018   NEUTROABS 3.1 12/03/2018    Imaging:  No results found.  Medications: I have reviewed the patient's current medications.  Assessment/Plan: 1. Pancreas head mass  CT abdomen/pelvis 08/03/2017-fullness in the head of the pancreas with stranding in the peripancreatic fat and pancreatic ductal dilatation.   MRI abdomen 08/04/2017-findings favored to represent acute on chronic pancreatitis; equivocal soft tissue fullness within the head and uncinate process of the pancreas; indeterminate too small to characterize right hepatic lobe lesion; small volume abdominal ascites; splenomegaly.   CA-19-9 elevated at 977on 08/04/2017.  Status  postupper EUS 08/10/2017-findings of an irregular masslike region in the pancreatic head measuring approximately 2.7 cm. This directly abutted the main portal vein but no other major vascular structures. Biopsies obtained with preliminary cytology positive for malignancy, likely adenocarcinoma. The final report is pending. The common bile duct and main pancreatic duct were both dilated. ERCP was then proceeded with forstent placement. Multiple attempts were made tocannulate the bile duct without success. The procedure was aborted.  08/16/2017 ERCP with placement of a metal biliary stent in the common bile duct by Dr. Francella Solian at [facility].  Cycle 1 FOLFIRINOX 08/31/2017  Cycle 2 FOLFIRINOX 09/14/2017  Cycle 3 FOLFIRINOX 09/28/2017  Cycle 4 FOLFIRINOX 10/12/2017 (oxaliplatin dose reduced secondary to thrombocytopenia)  Cycle 5 FOLFIRINOX 10/26/2017  Cycle 6 FOLFIRINOX 11/09/2017  Restaging CTs at [facility] 11/29/2017-no definitive evidence of distant metastatic disease. 2 subcentimeter liver lesions described on prior MRI not visualized on CT. Ill-defined pancreatic head mass stable to decreased in size measuring 2.1 x 2 cm. Peripancreatic inflammatory stranding decreased from prior. No biliary ductal dilatation. Celiac axis less than 180 degrees abutment. Common hepatic artery with greater than 180 degrees encasement; superior mesenteric artery with short segment less than 180 degrees abutment; portal vein/superior mesenteric vein with greater than 180 degrees encasement of the extrahepatic portal vein with associated circumferential narrowing at the portomesenteric, overall substantially improved from prior examination. Now less than 180 degree abutment of the SMV. The portal vein and SMV remain patent. Splenic vein patent.  Cycle 1 gemcitabine/Abraxane 12/06/2017  Cycle 2 gemcitabine/Abraxane 12/20/2017  Cycle 3 gemcitabine/Abraxane 01/03/2018  Cycle 4 gemcitabine/Abraxane 01/17/2018  Cycle 5  gemcitabine 01/31/2018 (  held due to neuropathy) °· Cycle 6 gemcitabine 02/14/2018 (Abraxane held due to neuropathy) °· Cycle 7 gemcitabine 02/28/2018 (Abraxane held due to neuropathy) °· CT chest/abdomen/pelvis at [facility] 03/08/2018- stable appearance of previously identified infiltrating pancreatic head mass.  No CT evidence of metastatic disease. °· SBRT to the pancreas 04/05/2018-04/11/2018 °· 05/09/2018 CA-19-9 2138  °· MRI abdomen 05/09/2018- similar 6 mm hypoenhancing focus posterior right lobe liver; more superior lesion near the dome of the liver not identified on the postcontrast imaging but there is a persistent focus of diffusion signal abnormality in this region; new 8 mm hypoenhancing focus located just superior to the gallbladder; additional tiny focus of diffusion abnormality located more superiorly without obvious associated abnormal enhancement.  Ill-defined pancreatic head mass not felt to be significantly changed. °· CT chest/abdomen/pelvis 05/10/2018- ill-defined hypoattenuating pancreatic head mass and ill-defined soft tissue abutting the celiac axis with mildly increased dilatation of the main pancreatic duct.  New soft tissue nodule along the right upper lobe bronchus; hypoattenuating liver lesions concerning for metastasis. 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  The stent traverses the pancreatic mass and extends into the duodenum.  Increase in size of 2 liver lesions.  Pancreatic mass appears similar.  Indistinct tissue planes around the duodenum along with wall thickening in the gallbladder and especially the gallbladder  neck. 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 Hypertension °3. Port-A-Cath placement 08/30/2017 °4. History of elevated bilirubin-question Gilbert's syndrome °5. Mild leukopenia, thrombocytopenia-question secondary to portal hypertension/splenomegaly; 09/14/2017 white count improved, platelet count stable; progressive thrombocytopenia following gemcitabine/Abraxane °6. Kidney stones °7. Fever following cycles 2 and 3 gemcitabine/Abraxane,?  Fever related to gemcitabine °8.   Jaundice, elevated liver enzymes and bilirubin 06/22/2018; status post ERCP 06/25/2018 with debris removed from the existing stent and a new plastic  stent placed.  Recurrent jaundice 07/19/2018 status post ERCP 07/20/2018 with finding that the previously placed plastic stent had migrated distally.  There was debris and sludge on the stent.  The previously placed stent was removed.  5 to 8 mm stricture just proximal to the existing SEMS.  2 plastic stents were placed. ERCP 08/16/2018- 2 plastic biliary stents were removed from within the previously   placed partially covered SEMS; stricture confirmed just proximal to the previously placed SEMS, treated with placement of an 8 cm long 10 mm diameter uncovered SEMS in good position. °9.   Hospitalized with bacteremia Streptococcus group C 11/04/2018 through 11/08/2018.  IV ceftriaxone through 11/17/2018.  TEE 11/08/2018 negative. °  ° °Disposition: Mr. Novella appears unchanged.  He has completed 6 cycles of FOLFIRINOX.   ° °We reviewed the CBC from today.  He has mild thrombocytopenia.  He understands to contact the office with any bleeding.  Review of chemistry panel shows significant elevation of liver enzymes.   ° °He continues to note abdominal distention with overall improvement in abdominal symptoms since beginning a bowel regimen.  He will continue the same. ° °Dr. Sherrill has spoken with Dr. Jacobs regarding elevated liver enzymes.  We are referring him for abdominal MRI.  Today's chemotherapy will be held and rescheduled for 1 week. ° ° °Patient seen with Dr. Sherrill. ° ° ° ° ° °[name] Thomas ANP/GNP-BC  ° °12/03/2018  °9:42 AM °This was a shared visit with [name] Thomas.  Mr. Swantek was interviewed and examined.  His overall clinical status appears stable, but the liver enzymes are more elevated today.  I discussed the case with Dr. Jacobs.  Chemotherapy will be held and he will be referred for an MRI of the liver.  Dr. Jacobs will proceed with a repeat ERCP if there appears to be significant biliary obstruction. ° °Brad Sherrill, MD ° ° ° ° ° ° °

## 2018-12-03 NOTE — Telephone Encounter (Signed)
TC to pt per Lattie Haw to let him know that the COVID testing is a drive through testing. Per Lattie Haw I also scheduled patients MRI appointment tomorrow at [facility] @10 :00am, patient is to arrive at 9:30am and to not eat or drink anything 4 hours prior to appointment. Patient aware of appointment date/ time and intructions.

## 2018-12-03 NOTE — Progress Notes (Signed)
TC to infusion per Dr Benay Spice. Spoke with Clinical cytogeneticist and let her know that Dr. Benay Spice would like for nurse to hold treatment right now due to abnormal labs and that he will let her know when ok to treat. Patient is currently waiting in [facility] room.

## 2018-12-03 NOTE — H&P (View-Only) (Signed)
°[facility] °OFFICE PROGRESS NOTE ° ° °Diagnosis: Pancreas cancer ° °INTERVAL HISTORY:  ° °[name] returns as scheduled.  He completed cycle 6 FOLFIRINOX 11/14/2018.  He denies nausea/vomiting.  No mouth sores.  He intermittently notes lips are peeling.  Abdomen overall feels better. He continues to note some bloating.  Bowels have been moving regularly over the past few days since beginning MiraLAX.  He has occasional abdominal pain.  He describes appetite as "okay".  He notes increased tingling in the fingertips.  Numbness in feet unchanged.  Symptoms do not interfere with activity. ° °Objective: ° °Vital signs in last 24 hours: ° °Blood pressure 123/83, pulse 63, temperature 98.5 °F (36.9 °C), temperature source Oral, resp. rate 18, height 6' 6" (1.981 m), weight 208 lb 1.6 oz (94.4 kg), SpO2 100 %. °  ° °GI: Abdomen mildly distended.  No hepatomegaly.  No definite ascites. °Vascular: Trace edema at the ankles bilaterally. °Neuro: Vibratory sense mildly decreased over the fingertips per tuning fork exam. °Skin: Palms without erythema. °Port-A-Cath without erythema. ° ° °Lab Results: ° °Lab Results  °Component Value Date  ° WBC 3.8 (L) 12/03/2018  ° HGB 10.8 (L) 12/03/2018  ° HCT 33.6 (L) 12/03/2018  ° MCV 87.3 12/03/2018  ° PLT 95 (L) 12/03/2018  ° NEUTROABS 3.1 12/03/2018  ° ° °Imaging: ° °No results found. ° °Medications: I have reviewed the patient's current medications. ° °Assessment/Plan: °1.  Pancreas head mass °· CT abdomen/pelvis 08/03/2017-fullness in the head of the pancreas with stranding in the peripancreatic fat and pancreatic ductal dilatation.   °· MRI abdomen 08/04/2017-findings favored to represent acute on chronic pancreatitis; equivocal soft tissue fullness within the head and uncinate process of the pancreas; indeterminate too small to characterize right hepatic lobe lesion; small volume abdominal ascites; splenomegaly.   °· CA-19-9 elevated at 977 on 08/04/2017. °· Status  post upper EUS 08/10/2017-findings of an irregular masslike region in the pancreatic head measuring approximately 2.7 cm.  This directly abutted the main portal vein but no other major vascular structures.  Biopsies obtained with preliminary cytology positive for malignancy, likely adenocarcinoma. The final report is pending.  The common bile duct and main pancreatic duct were both dilated.  ERCP was then proceeded with for stent placement.  Multiple attempts were made to cannulate the bile duct without success.  The procedure was aborted.   °· 08/16/2017 ERCP with placement of a metal biliary stent in the common bile duct by Dr. Jowell at [facility]. °· Cycle 1 FOLFIRINOX 08/31/2017 °· Cycle 2 FOLFIRINOX 09/14/2017 °· Cycle 3 FOLFIRINOX 09/28/2017 °· Cycle 4 FOLFIRINOX 10/12/2017 (oxaliplatin dose reduced secondary to thrombocytopenia) °· Cycle 5 FOLFIRINOX 10/26/2017 °· Cycle 6 FOLFIRINOX 11/09/2017 °· Restaging CTs at [facility] 11/29/2017- no definitive evidence of distant metastatic disease.  2 subcentimeter liver lesions described on prior MRI not visualized on CT.  Ill-defined pancreatic head mass stable to decreased in size measuring 2.1 x 2 cm.  Peripancreatic inflammatory stranding decreased from prior.  No biliary ductal dilatation.  Celiac axis less than 180 degrees abutment.  Common hepatic artery with greater than 180 degrees encasement; superior mesenteric artery with short segment less than 180 degrees abutment; portal vein/superior mesenteric vein with greater than 180 degrees encasement of the extrahepatic portal vein with associated circumferential narrowing at the portomesenteric, overall substantially improved from prior examination.  Now less than 180 degree abutment of the SMV.  The portal vein and SMV remain patent.  Splenic vein patent. 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  No CT evidence of metastatic disease. °· SBRT to the pancreas 04/05/2018-04/11/2018 °· 05/09/2018 CA-19-9 2138  °· MRI abdomen 05/09/2018- similar 6 mm hypoenhancing focus posterior right lobe liver; more superior lesion near the dome of the liver not identified on the postcontrast imaging but there is a persistent focus of diffusion signal abnormality in this region; new 8 mm hypoenhancing focus located just superior to the gallbladder; additional tiny focus of diffusion abnormality located more superiorly without obvious associated abnormal enhancement.  Ill-defined pancreatic head mass not felt to be significantly changed. °· CT chest/abdomen/pelvis 05/10/2018- ill-defined hypoattenuating pancreatic head mass and ill-defined soft tissue abutting the celiac axis with mildly increased dilatation of the main pancreatic duct.  New soft tissue nodule along the right upper lobe bronchus; hypoattenuating liver lesions concerning for metastasis. 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  The stent traverses the pancreatic mass and extends into the duodenum.  Increase in size of 2 liver lesions.  Pancreatic mass appears similar.  Indistinct tissue planes around the duodenum along with wall thickening in the gallbladder and especially the gallbladder  neck. 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 Hypertension °3. Port-A-Cath placement 08/30/2017 °4. History of elevated bilirubin-question Gilbert's syndrome °5. Mild leukopenia, thrombocytopenia-question secondary to portal hypertension/splenomegaly; 09/14/2017 white count improved, platelet count stable; progressive thrombocytopenia following gemcitabine/Abraxane °6. Kidney stones °7. Fever following cycles 2 and 3 gemcitabine/Abraxane,?  Fever related to gemcitabine °8.   Jaundice, elevated liver enzymes and bilirubin 06/22/2018; status post ERCP 06/25/2018 with debris removed from the existing stent and a new plastic  stent placed.  Recurrent jaundice 07/19/2018 status post ERCP 07/20/2018 with finding that the previously placed plastic stent had migrated distally.  There was debris and sludge on the stent.  The previously placed stent was removed.  5 to 8 mm stricture just proximal to the existing SEMS.  2 plastic stents were placed. ERCP 08/16/2018- 2 plastic biliary stents were removed from within the previously   placed partially covered SEMS; stricture confirmed just proximal to the previously placed SEMS, treated with placement of an 8 cm long 10 mm diameter uncovered SEMS in good position. °9.   Hospitalized with bacteremia Streptococcus group C 11/04/2018 through 11/08/2018.  IV ceftriaxone through 11/17/2018.  TEE 11/08/2018 negative. °  ° °Disposition: Cory Kelly appears unchanged.  He has completed 6 cycles of FOLFIRINOX.   ° °We reviewed the CBC from today.  He has mild thrombocytopenia.  He understands to contact the office with any bleeding.  Review of chemistry panel shows significant elevation of liver enzymes.   ° °He continues to note abdominal distention with overall improvement in abdominal symptoms since beginning a bowel regimen.  He will continue the same. ° °Dr. Sherrill has spoken with Dr. Jacobs regarding elevated liver enzymes.  We are referring him for abdominal MRI.  Today's chemotherapy will be held and rescheduled for 1 week. ° ° °Patient seen with Dr. Sherrill. ° ° ° ° ° °Berley Gambrell ANP/GNP-BC  ° °12/03/2018  °9:42 AM °This was a shared visit with Jalexa Pifer.  Cory Kelly was interviewed and examined.  His overall clinical status appears stable, but the liver enzymes are more elevated today.  I discussed the case with Dr. Jacobs.  Chemotherapy will be held and he will be referred for an MRI of the liver.  Dr. Jacobs will proceed with a repeat ERCP if there appears to be significant biliary obstruction. ° °Brad Sherrill, MD ° ° ° ° ° ° °

## 2018-12-03 NOTE — Telephone Encounter (Signed)
TC to infusion per Lattie Haw. I let Clinical cytogeneticist know that patients infusion is cancelled today!

## 2018-12-04 ENCOUNTER — Encounter: Payer: Self-pay | Admitting: Nurse Practitioner

## 2018-12-04 ENCOUNTER — Other Ambulatory Visit (HOSPITAL_COMMUNITY): Admission: RE | Admit: 2018-12-04 | Payer: BC Managed Care – PPO | Source: Ambulatory Visit

## 2018-12-04 ENCOUNTER — Other Ambulatory Visit: Payer: Self-pay | Admitting: Nurse Practitioner

## 2018-12-04 ENCOUNTER — Telehealth: Payer: Self-pay | Admitting: Nurse Practitioner

## 2018-12-04 ENCOUNTER — Ambulatory Visit (HOSPITAL_COMMUNITY)
Admission: RE | Admit: 2018-12-04 | Discharge: 2018-12-04 | Disposition: A | Discharge status: Home / Self Care | Payer: BC Managed Care – PPO | Source: Ambulatory Visit | Attending: Nurse Practitioner | Admitting: Nurse Practitioner

## 2018-12-04 DIAGNOSIS — C250 Malignant neoplasm of head of pancreas: Secondary | ICD-10-CM

## 2018-12-04 DIAGNOSIS — C787 Secondary malignant neoplasm of liver and intrahepatic bile duct: Secondary | ICD-10-CM | POA: Diagnosis not present

## 2018-12-04 DIAGNOSIS — C259 Malignant neoplasm of pancreas, unspecified: Secondary | ICD-10-CM | POA: Diagnosis not present

## 2018-12-04 LAB — CANCER ANTIGEN 19-9: CA 19-9: 619 U/mL — ABNORMAL HIGH (ref 0–35)

## 2018-12-04 MED ORDER — GADOBUTROL 1 MMOL/ML IV SOLN
10.0000 mL | Freq: Once | INTRAVENOUS | Status: AC | PRN
  Administered 2018-12-04: 10 mL via INTRAVENOUS

## 2018-12-04 MED ORDER — HEPARIN SOD (PORK) LOCK FLUSH 100 UNIT/ML IV SOLN
INTRAVENOUS | Status: AC
  Filled 2018-12-04: qty 5

## 2018-12-04 MED ORDER — HEPARIN SOD (PORK) LOCK FLUSH 100 UNIT/ML IV SOLN: 500.0000 [IU] | Freq: Once | INTRAVENOUS | Status: DC

## 2018-12-04 NOTE — Telephone Encounter (Signed)
Scheduled appt per 6/1 los.  Spoke with patient and patient aware of appt date and time.

## 2018-12-05 ENCOUNTER — Telehealth: Payer: Self-pay | Admitting: Gastroenterology

## 2018-12-05 ENCOUNTER — Encounter (HOSPITAL_COMMUNITY): Payer: Self-pay | Admitting: *Deleted

## 2018-12-05 ENCOUNTER — Telehealth: Payer: Self-pay | Admitting: *Deleted

## 2018-12-05 ENCOUNTER — Other Ambulatory Visit: Payer: Self-pay

## 2018-12-05 LAB — NOVEL CORONAVIRUS, NAA (HOSP ORDER, SEND-OUT TO REF LAB; TAT 18-24 HRS): SARS-CoV-2, NAA: NOT DETECTED

## 2018-12-05 NOTE — Telephone Encounter (Signed)
Notified patient of his tumor marker results. Improved.

## 2018-12-05 NOTE — Telephone Encounter (Signed)
Pt's wife would like a call to discuss pt's MRI results before EGD at hospital tomorrow.

## 2018-12-05 NOTE — Telephone Encounter (Signed)
Grace Bushy CMA called pt at 2 pm today.

## 2018-12-06 ENCOUNTER — Ambulatory Visit (HOSPITAL_COMMUNITY): Payer: BC Managed Care – PPO

## 2018-12-06 ENCOUNTER — Encounter (HOSPITAL_COMMUNITY): Payer: Self-pay

## 2018-12-06 ENCOUNTER — Ambulatory Visit (HOSPITAL_COMMUNITY)
Admission: RE | Admit: 2018-12-06 | Discharge: 2018-12-06 | Disposition: A | Discharge status: Home / Self Care | Payer: BC Managed Care – PPO | Attending: Gastroenterology | Admitting: Gastroenterology

## 2018-12-06 ENCOUNTER — Other Ambulatory Visit: Payer: Self-pay

## 2018-12-06 ENCOUNTER — Ambulatory Visit (HOSPITAL_COMMUNITY): Payer: BC Managed Care – PPO | Admitting: Anesthesiology

## 2018-12-06 ENCOUNTER — Encounter (HOSPITAL_COMMUNITY)
Admission: RE | Disposition: A | Discharge status: Home / Self Care | Payer: Self-pay | Source: Home / Self Care | Attending: Gastroenterology

## 2018-12-06 DIAGNOSIS — Z79899 Other long term (current) drug therapy: Secondary | ICD-10-CM | POA: Diagnosis not present

## 2018-12-06 DIAGNOSIS — K831 Obstruction of bile duct: Secondary | ICD-10-CM

## 2018-12-06 DIAGNOSIS — C259 Malignant neoplasm of pancreas, unspecified: Secondary | ICD-10-CM

## 2018-12-06 DIAGNOSIS — K766 Portal hypertension: Secondary | ICD-10-CM | POA: Insufficient documentation

## 2018-12-06 DIAGNOSIS — I10 Essential (primary) hypertension: Secondary | ICD-10-CM | POA: Insufficient documentation

## 2018-12-06 DIAGNOSIS — D696 Thrombocytopenia, unspecified: Secondary | ICD-10-CM | POA: Insufficient documentation

## 2018-12-06 DIAGNOSIS — R748 Abnormal levels of other serum enzymes: Secondary | ICD-10-CM | POA: Diagnosis not present

## 2018-12-06 DIAGNOSIS — C787 Secondary malignant neoplasm of liver and intrahepatic bile duct: Secondary | ICD-10-CM | POA: Insufficient documentation

## 2018-12-06 DIAGNOSIS — R161 Splenomegaly, not elsewhere classified: Secondary | ICD-10-CM | POA: Insufficient documentation

## 2018-12-06 DIAGNOSIS — E785 Hyperlipidemia, unspecified: Secondary | ICD-10-CM | POA: Diagnosis not present

## 2018-12-06 DIAGNOSIS — C250 Malignant neoplasm of head of pancreas: Secondary | ICD-10-CM | POA: Diagnosis not present

## 2018-12-06 DIAGNOSIS — Z8507 Personal history of malignant neoplasm of pancreas: Secondary | ICD-10-CM | POA: Diagnosis not present

## 2018-12-06 HISTORY — PX: ENDOSCOPIC RETROGRADE CHOLANGIOPANCREATOGRAPHY (ERCP) WITH PROPOFOL: SHX5810

## 2018-12-06 SURGERY — ENDOSCOPIC RETROGRADE CHOLANGIOPANCREATOGRAPHY (ERCP) WITH PROPOFOL
Anesthesia: General

## 2018-12-06 MED ORDER — INDOMETHACIN 50 MG RE SUPP
RECTAL | Status: DC | PRN
  Administered 2018-12-06: 100 mg via RECTAL

## 2018-12-06 MED ORDER — ONDANSETRON HCL 4 MG/2ML IJ SOLN
INTRAMUSCULAR | Status: DC | PRN
  Administered 2018-12-06: 4 mg via INTRAVENOUS

## 2018-12-06 MED ORDER — SUGAMMADEX SODIUM 500 MG/5ML IV SOLN
INTRAVENOUS | Status: DC | PRN
  Administered 2018-12-06: 300 mg via INTRAVENOUS

## 2018-12-06 MED ORDER — INDOMETHACIN 50 MG RE SUPP
RECTAL | Status: AC
  Filled 2018-12-06: qty 2

## 2018-12-06 MED ORDER — PROPOFOL 10 MG/ML IV BOLUS
INTRAVENOUS | Status: AC
  Filled 2018-12-06: qty 60

## 2018-12-06 MED ORDER — PROPOFOL 10 MG/ML IV BOLUS
INTRAVENOUS | Status: AC
  Filled 2018-12-06: qty 20

## 2018-12-06 MED ORDER — PROPOFOL 10 MG/ML IV BOLUS
INTRAVENOUS | Status: AC
  Filled 2018-12-06: qty 40

## 2018-12-06 MED ORDER — CIPROFLOXACIN IN D5W 400 MG/200ML IV SOLN
INTRAVENOUS | Status: DC | PRN
  Administered 2018-12-06: 400 mg via INTRAVENOUS

## 2018-12-06 MED ORDER — ROCURONIUM BROMIDE 100 MG/10ML IV SOLN
INTRAVENOUS | Status: DC | PRN
  Administered 2018-12-06: 60 mg via INTRAVENOUS

## 2018-12-06 MED ORDER — FENTANYL CITRATE (PF) 100 MCG/2ML IJ SOLN
INTRAMUSCULAR | Status: DC | PRN
  Administered 2018-12-06: 50 ug via INTRAVENOUS
  Administered 2018-12-06: 100 ug via INTRAVENOUS

## 2018-12-06 MED ORDER — DEXAMETHASONE SODIUM PHOSPHATE 10 MG/ML IJ SOLN
INTRAMUSCULAR | Status: DC | PRN
  Administered 2018-12-06: 10 mg via INTRAVENOUS

## 2018-12-06 MED ORDER — LACTATED RINGERS IV SOLN
INTRAVENOUS | Status: DC
  Administered 2018-12-06: 1000 mL via INTRAVENOUS

## 2018-12-06 MED ORDER — FENTANYL CITRATE (PF) 100 MCG/2ML IJ SOLN
INTRAMUSCULAR | Status: AC
  Filled 2018-12-06: qty 2

## 2018-12-06 MED ORDER — PROPOFOL 10 MG/ML IV BOLUS
INTRAVENOUS | Status: DC | PRN
  Administered 2018-12-06: 130 mg via INTRAVENOUS

## 2018-12-06 MED ORDER — CIPROFLOXACIN IN D5W 400 MG/200ML IV SOLN
INTRAVENOUS | Status: AC
  Filled 2018-12-06: qty 200

## 2018-12-06 MED ORDER — GLUCAGON HCL RDNA (DIAGNOSTIC) 1 MG IJ SOLR
INTRAMUSCULAR | Status: AC
  Filled 2018-12-06: qty 1

## 2018-12-06 MED ORDER — LIDOCAINE HCL (CARDIAC) PF 100 MG/5ML IV SOSY
PREFILLED_SYRINGE | INTRAVENOUS | Status: DC | PRN
  Administered 2018-12-06: 60 mg via INTRAVENOUS

## 2018-12-06 MED ORDER — SODIUM CHLORIDE 0.9 % IV SOLN: INTRAVENOUS | Status: DC

## 2018-12-06 MED ORDER — SODIUM CHLORIDE 0.9 % IV SOLN
INTRAVENOUS | Status: DC | PRN
  Administered 2018-12-06: 30 mL

## 2018-12-06 NOTE — Op Note (Signed)
[facility] Patient Name: Cory Kelly Procedure Date: 12/06/2018 MRN: [id number] Attending MD: Milus Banister , MD Date of Birth: August 30, 1955 CSN: [id number] Age: 63 Admit Type: Outpatient Procedure:                ERCP Indications:              Malignant stricture of the common bile duct; ERCP                            08/2018 with placement of 8cm long, 69mm diameter                            uncovered SEMS. Recent significantly increasing                            LFTs. MRI showed no intrahepatic biliary dilation. Providers:                Milus Banister, MD, Cleda Daub, RN, Cletis Athens, Technician, Stephanie [place] CRNA Referring MD:             Julieanne Manson, MD Medicines:                General Anesthesia, Cipro 400 mg IV, Indomethacin                            010 mg PR Complications:            No immediate complications. Estimated blood loss:                            Minimal. Estimated Blood Loss:     Estimated blood loss: none. Procedure:                Pre-Anesthesia Assessment:                           - Prior to the procedure, a History and Physical                            was performed, and patient medications and                            allergies were reviewed. The patient's tolerance of                            previous anesthesia was also reviewed. The risks                            and benefits of the procedure and the sedation                            options and risks were discussed with the patient.  All questions were answered, and informed consent                            was obtained. Prior Anticoagulants: The patient has                            taken no previous anticoagulant or antiplatelet                            agents. ASA Grade Assessment: III - A patient with                            severe systemic disease. After reviewing the risks             and benefits, the patient was deemed in                            satisfactory condition to undergo the procedure.                           After obtaining informed consent, the scope was                            passed under direct vision. Throughout the                            procedure, the patient's blood pressure, pulse, and                            oxygen saturations were monitored continuously. The                            TJF-Q180V ([id number] Olympus duodenoscope was                            introduced through the mouth, and used to inject                            contrast into and used to inject contrast into the                            bile duct. The ERCP was accomplished without                            difficulty. The patient tolerated the procedure                            well. Scope In: Scope Out: Findings:      The duodenoscope was advanced to the region of the major papilla. The       mucosa in the duodenal bulb was significantly edematous which impeded       passage of the duodenoscope somewhat. The previously placed uncovered       metal SEMS was found in good position extending across the major  papilla. The stent was filled with biodebris and there was obvious       mucosal ingrowth (tumor?) at the distal aspect of the stent. A 44       Autotome over a .035 hydrawire was used to cannulate the biliary stent       and contrast was injected. Cholangiogram revealed significant luminal       narrowing of the SEMS throughout. The biliary tree proximal to the stent       was incompletely filled but was not obviously dilated. I used a 9-73mm       biliary retrieval balloon to sweep the stent several times. This       delivered copious biodebris, tissue and a small amount of blood into the       duodenum. Following several sweeps I injected contrast again and this       showed the effect stent lumen was much improved, in fact appeared nearly        normal and the scope was withdrawn. The pancreatic duct was never       cannulated with a wire or injected with dye. Impression:               - The previously placed uncovered 8cm long metal                            biliary stent was nearly filled with biodebris and                            flecks of tissue ingrowth. This created a very                            limited effective lumen through the stent. I used a                            biliary retrieval balloon to clear the stent                            internally and the effective lumen through the                            stent was much improved afterwards by cholangiogram. Moderate Sedation:      Not Applicable - Patient had care per Anesthesia. Recommendation:           - Discharge patient to home.                           - LFTs on Monday. Procedure Code(s):        --- Professional ---                           [phone], Endoscopic retrograde                            cholangiopancreatography (ERCP); with removal of                            calculi/debris from biliary/pancreatic duct(s) Diagnosis Code(s):        ---  Professional ---                           K83.1, Obstruction of bile duct CPT copyright 2019 American Medical Association. All rights reserved. The codes documented in this report are preliminary and upon coder review may  be revised to meet current compliance requirements. Milus Banister, MD 12/06/2018 11:51:52 AM This report has been signed electronically. Number of Addenda: 0

## 2018-12-06 NOTE — Anesthesia Postprocedure Evaluation (Signed)
Anesthesia Post Note  Patient: Cory Kelly  Procedure(s) Performed: ENDOSCOPIC RETROGRADE CHOLANGIOPANCREATOGRAPHY (ERCP) WITH PROPOFOL (N/A )     Patient location during evaluation: PACU Anesthesia Type: General Level of consciousness: awake and alert Pain management: pain level controlled Vital Signs Assessment: post-procedure vital signs reviewed and stable Respiratory status: spontaneous breathing, nonlabored ventilation, respiratory function stable and patient connected to nasal cannula oxygen Cardiovascular status: blood pressure returned to baseline and stable Postop Assessment: no apparent nausea or vomiting Anesthetic complications: no    Last Vitals:  Vitals:   12/06/18 1200 12/06/18 1210  BP: 116/77 126/79  Pulse: 60 (!) 59  Resp: 10 (!) 9  Temp:    SpO2: 100% 97%    Last Pain:  Vitals:   12/06/18 1150  TempSrc: Oral  PainSc:                  Grainger Mccarley

## 2018-12-06 NOTE — Anesthesia Preprocedure Evaluation (Signed)
Anesthesia Evaluation  Patient identified by MRN, date of birth, ID band Patient awake    Reviewed: Allergy & Precautions, H&P , NPO status , Patient's Chart, lab work & pertinent test results  History of Anesthesia Complications Negative for: history of anesthetic complications  Airway Mallampati: II  TM Distance: >3 FB Neck ROM: Full    Dental no notable dental hx. (+) Teeth Intact, Dental Advisory Given, Partial Upper   Pulmonary neg pulmonary ROS,    Pulmonary exam normal breath sounds clear to auscultation       Cardiovascular hypertension, Pt. on medications  Rhythm:Regular Rate:Normal     Neuro/Psych negative neurological ROS  negative psych ROS   GI/Hepatic negative GI ROS, Neg liver ROS,   Endo/Other  Pancreatic CA  Renal/GU negative Renal ROS  negative genitourinary   Musculoskeletal   Abdominal   Peds  Hematology negative hematology ROS (+)   Anesthesia Other Findings   Reproductive/Obstetrics negative OB ROS                             Anesthesia Physical Anesthesia Plan  ASA: III  Anesthesia Plan: General   Post-op Pain Management:    Induction: Intravenous  PONV Risk Score and Plan: 2 and Ondansetron and Dexamethasone  Airway Management Planned: Oral ETT  Additional Equipment: None  Intra-op Plan:   Post-operative Plan: Extubation in OR  Informed Consent: I have reviewed the patients History and Physical, chart, labs and discussed the procedure including the risks, benefits and alternatives for the proposed anesthesia with the patient or authorized representative who has indicated his/her understanding and acceptance.     Dental advisory given  Plan Discussed with: CRNA  Anesthesia Plan Comments:         Anesthesia Quick Evaluation

## 2018-12-06 NOTE — Interval H&P Note (Signed)
History and Physical Interval Note:  12/06/2018 9:55 AM  Cory Kelly  has presented today for surgery, with the diagnosis of Pancreatic cancer.  The various methods of treatment have been discussed with the patient and family. After consideration of risks, benefits and other options for treatment, the patient has consented to  Procedure(s): ENDOSCOPIC RETROGRADE CHOLANGIOPANCREATOGRAPHY (ERCP) WITH PROPOFOL (N/A) as a surgical intervention.  The patient's history has been reviewed, patient examined, no change in status, stable for surgery.  I have reviewed the patient's chart and labs.  Questions were answered to the patient's satisfaction.     Milus Banister

## 2018-12-06 NOTE — Transfer of Care (Signed)
Immediate Anesthesia Transfer of Care Note  Patient: Cory Kelly  Procedure(s) Performed: ENDOSCOPIC RETROGRADE CHOLANGIOPANCREATOGRAPHY (ERCP) WITH PROPOFOL (N/A )  Patient Location: PACU and Endoscopy Unit  Anesthesia Type:General  Level of Consciousness: awake, alert  and oriented  Airway & Oxygen Therapy: Patient Spontanous Breathing and Patient connected to face mask oxygen  Post-op Assessment: Report given to RN and Post -op Vital signs reviewed and stable  Post vital signs: Reviewed and stable  Last Vitals:  Vitals Value Taken Time  BP 119/73 12/06/2018 11:50 AM  Temp    Pulse 61 12/06/2018 11:53 AM  Resp 10 12/06/2018 11:53 AM  SpO2 100 % 12/06/2018 11:53 AM  Vitals shown include unvalidated device data.  Last Pain:  Vitals:   12/06/18 0928  TempSrc: Oral  PainSc: 0-No pain         Complications: No apparent anesthesia complications

## 2018-12-06 NOTE — Discharge Instructions (Signed)
YOU HAD AN ENDOSCOPIC PROCEDURE TODAY: Refer to the procedure report and other information in the discharge instructions given to you for any specific questions about what was found during the examination. If this information does not answer your questions, please call [place] office at [phone] to clarify.   YOU SHOULD EXPECT: Some feelings of bloating in the abdomen. Passage of more gas than usual. Walking can help get rid of the air that was put into your GI tract during the procedure and reduce the bloating. If you had a lower endoscopy (such as a colonoscopy or flexible sigmoidoscopy) you may notice spotting of blood in your stool or on the toilet paper. Some abdominal soreness may be present for a day or two, also.  DIET: Your first meal following the procedure should be a light meal and then it is ok to progress to your normal diet. A half-sandwich or bowl of soup is an example of a good first meal. Heavy or fried foods are harder to digest and may make you feel nauseous or bloated. Drink plenty of fluids but you should avoid alcoholic beverages for 24 hours. If you had a esophageal dilation, please see attached instructions for diet.    ACTIVITY: Your care partner should take you home directly after the procedure. You should plan to take it easy, moving slowly for the rest of the day. You can resume normal activity the day after the procedure however YOU SHOULD NOT DRIVE, use power tools, machinery or perform tasks that involve climbing or major physical exertion for 24 hours (because of the sedation medicines used during the test).   SYMPTOMS TO REPORT IMMEDIATELY: A gastroenterologist can be reached at any hour. Please call [phone]  for any of the following symptoms:  Endoscopy (EGD, EUS, ERCP, esophageal dilation) Vomiting of blood or coffee ground material  New, significant abdominal pain  New, significant chest pain or pain under the shoulder blades  Painful or persistently  difficult swallowing  New shortness of breath  Black, tarry-looking or red, bloody stools  FOLLOW UP:  If any biopsies were taken you will be contacted by phone or by letter within the next 1-3 weeks. Call [phone]  if you have not heard about the biopsies in 3 weeks.  Please also call with any specific questions about appointments or follow up tests.

## 2018-12-06 NOTE — Anesthesia Procedure Notes (Signed)
Procedure Name: Intubation Date/Time: 12/06/2018 10:49 AM Performed by: [place] Latasha Buczkowski C, CRNA Pre-anesthesia Checklist: Patient identified, Emergency Drugs available, Suction available and Patient being monitored Patient Re-evaluated:Patient Re-evaluated prior to induction Oxygen Delivery Method: Circle system utilized Preoxygenation: Pre-oxygenation with 100% oxygen Induction Type: IV induction Ventilation: Mask ventilation without difficulty Laryngoscope Size: Mac and 4 Grade View: Grade II Tube type: Oral Tube size: 7.5 mm Number of attempts: 1 Airway Equipment and Method: Stylet and Oral airway Placement Confirmation: ETT inserted through vocal cords under direct vision,  positive ETCO2 and breath sounds checked- equal and bilateral Tube secured with: Tape Dental Injury: Teeth and Oropharynx as per pre-operative assessment

## 2018-12-07 ENCOUNTER — Encounter (HOSPITAL_COMMUNITY): Payer: Self-pay | Admitting: Gastroenterology

## 2018-12-07 ENCOUNTER — Other Ambulatory Visit: Payer: Self-pay | Admitting: Nurse Practitioner

## 2018-12-07 ENCOUNTER — Telehealth: Payer: Self-pay | Admitting: Gastroenterology

## 2018-12-07 DIAGNOSIS — C250 Malignant neoplasm of head of pancreas: Secondary | ICD-10-CM

## 2018-12-07 NOTE — Telephone Encounter (Signed)
Patient wife called for pathology results.

## 2018-12-07 NOTE — Telephone Encounter (Signed)
The pt's wife had questions about seeing the ERCP report on My Chart.  I did make her aware that she would need to be on a desk top to view.  Cell phones will not pull up that information. The pt has been advised of the information and verbalized understanding.

## 2018-12-10 ENCOUNTER — Telehealth: Payer: Self-pay | Admitting: Nurse Practitioner

## 2018-12-10 ENCOUNTER — Inpatient Hospital Stay: Payer: BC Managed Care – PPO

## 2018-12-10 ENCOUNTER — Inpatient Hospital Stay (HOSPITAL_BASED_OUTPATIENT_CLINIC_OR_DEPARTMENT_OTHER): Payer: BC Managed Care – PPO | Admitting: Nurse Practitioner

## 2018-12-10 ENCOUNTER — Encounter: Payer: Self-pay | Admitting: Nurse Practitioner

## 2018-12-10 ENCOUNTER — Other Ambulatory Visit: Payer: Self-pay

## 2018-12-10 VITALS — BP 122/74 | HR 57 | Temp 98.3°F | Resp 18 | Ht 78.0 in | Wt 200.2 lb

## 2018-12-10 DIAGNOSIS — C787 Secondary malignant neoplasm of liver and intrahepatic bile duct: Secondary | ICD-10-CM

## 2018-12-10 DIAGNOSIS — Z95828 Presence of other vascular implants and grafts: Secondary | ICD-10-CM

## 2018-12-10 DIAGNOSIS — R945 Abnormal results of liver function studies: Secondary | ICD-10-CM | POA: Diagnosis not present

## 2018-12-10 DIAGNOSIS — C250 Malignant neoplasm of head of pancreas: Secondary | ICD-10-CM | POA: Diagnosis not present

## 2018-12-10 DIAGNOSIS — Z5189 Encounter for other specified aftercare: Secondary | ICD-10-CM | POA: Diagnosis not present

## 2018-12-10 DIAGNOSIS — R911 Solitary pulmonary nodule: Secondary | ICD-10-CM | POA: Diagnosis not present

## 2018-12-10 DIAGNOSIS — K769 Liver disease, unspecified: Secondary | ICD-10-CM

## 2018-12-10 DIAGNOSIS — I10 Essential (primary) hypertension: Secondary | ICD-10-CM | POA: Diagnosis not present

## 2018-12-10 DIAGNOSIS — Z5111 Encounter for antineoplastic chemotherapy: Secondary | ICD-10-CM | POA: Diagnosis not present

## 2018-12-10 DIAGNOSIS — R110 Nausea: Secondary | ICD-10-CM

## 2018-12-10 DIAGNOSIS — R188 Other ascites: Secondary | ICD-10-CM

## 2018-12-10 LAB — CMP (CANCER CENTER ONLY)
ALT: 83 U/L — ABNORMAL HIGH (ref 0–44)
AST: 47 U/L — ABNORMAL HIGH (ref 15–41)
Albumin: 3 g/dL — ABNORMAL LOW (ref 3.5–5.0)
Alkaline Phosphatase: 818 U/L — ABNORMAL HIGH (ref 38–126)
Anion gap: 9 (ref 5–15)
BUN: 6 mg/dL — ABNORMAL LOW (ref 8–23)
CO2: 25 mmol/L (ref 22–32)
Calcium: 8.1 mg/dL — ABNORMAL LOW (ref 8.9–10.3)
Chloride: 100 mmol/L (ref 98–111)
Creatinine: 0.76 mg/dL (ref 0.61–1.24)
GFR, Est AFR Am: >60 mL/min (ref >60)
GFR, Estimated: >60 mL/min (ref >60)
Glucose, Bld: 184 mg/dL — ABNORMAL HIGH (ref 70–99)
Potassium: 3.8 mmol/L (ref 3.5–5.1)
Sodium: 134 mmol/L — ABNORMAL LOW (ref 135–145)
Total Bilirubin: 0.9 mg/dL (ref 0.3–1.2)
Total Protein: 5.7 g/dL — ABNORMAL LOW (ref 6.5–8.1)

## 2018-12-10 LAB — CBC WITH DIFFERENTIAL (CANCER CENTER ONLY)
Abs Immature Granulocytes: 0.03 10*3/uL (ref 0.00–0.07)
Basophils Absolute: 0 10*3/uL (ref 0.0–0.1)
Basophils Relative: 1 %
Eosinophils Absolute: 0 10*3/uL (ref 0.0–0.5)
Eosinophils Relative: 0 %
HCT: 35.2 % — ABNORMAL LOW (ref 39.0–52.0)
Hemoglobin: 11.5 g/dL — ABNORMAL LOW (ref 13.0–17.0)
Immature Granulocytes: 1 %
Lymphocytes Relative: 5 %
Lymphs Abs: 0.3 10*3/uL — ABNORMAL LOW (ref 0.7–4.0)
MCH: 28.3 pg (ref 26.0–34.0)
MCHC: 32.7 g/dL (ref 30.0–36.0)
MCV: 86.7 fL (ref 80.0–100.0)
Monocytes Absolute: 0.3 10*3/uL (ref 0.1–1.0)
Monocytes Relative: 5 %
Neutro Abs: 5.5 10*3/uL (ref 1.7–7.7)
Neutrophils Relative %: 88 %
Platelet Count: 116 10*3/uL — ABNORMAL LOW (ref 150–400)
RBC: 4.06 MIL/uL — ABNORMAL LOW (ref 4.22–5.81)
RDW: 15.3 % (ref 11.5–15.5)
WBC Count: 6.2 10*3/uL (ref 4.0–10.5)
nRBC: 0 % (ref 0.0–0.2)

## 2018-12-10 MED ORDER — HEPARIN SOD (PORK) LOCK FLUSH 100 UNIT/ML IV SOLN
500.0000 [IU] | Freq: Once | INTRAVENOUS | Status: DC
  Filled 2018-12-10: qty 5

## 2018-12-10 MED ORDER — DEXTROSE 5 % IV SOLN
Freq: Once | INTRAVENOUS | Status: AC
  Administered 2018-12-10: 13:00:00 via INTRAVENOUS
  Filled 2018-12-10: qty 250

## 2018-12-10 MED ORDER — ATROPINE SULFATE 0.4 MG/ML IJ SOLN
INTRAMUSCULAR | Status: AC
  Filled 2018-12-10: qty 1

## 2018-12-10 MED ORDER — SODIUM CHLORIDE 0.9% FLUSH
10.0000 mL | Freq: Once | INTRAVENOUS | Status: AC
  Administered 2018-12-10: 10 mL
  Filled 2018-12-10: qty 10

## 2018-12-10 MED ORDER — PALONOSETRON HCL INJECTION 0.25 MG/5ML
0.2500 mg | Freq: Once | INTRAVENOUS | Status: AC
  Administered 2018-12-10: 0.25 mg via INTRAVENOUS

## 2018-12-10 MED ORDER — SODIUM CHLORIDE 0.9 % IV SOLN
90.0000 mg/m2 | Freq: Once | INTRAVENOUS | Status: AC
  Administered 2018-12-10: 200 mg via INTRAVENOUS
  Filled 2018-12-10: qty 10

## 2018-12-10 MED ORDER — SODIUM CHLORIDE 0.9 % IV SOLN
Freq: Once | INTRAVENOUS | Status: AC
  Administered 2018-12-10: 13:00:00 via INTRAVENOUS
  Filled 2018-12-10: qty 5

## 2018-12-10 MED ORDER — LEUCOVORIN CALCIUM INJECTION 350 MG: 300.0000 mg/m2 | Freq: Once | INTRAVENOUS | Status: DC

## 2018-12-10 MED ORDER — SODIUM CHLORIDE 0.9 % IV SOLN
300.0000 mg/m2 | Freq: Once | INTRAVENOUS | Status: AC
  Administered 2018-12-10: 676 mg via INTRAVENOUS
  Filled 2018-12-10: qty 33.8

## 2018-12-10 MED ORDER — PALONOSETRON HCL INJECTION 0.25 MG/5ML
INTRAVENOUS | Status: AC
  Filled 2018-12-10: qty 5

## 2018-12-10 MED ORDER — ATROPINE SULFATE 1 MG/ML IJ SOLN: 0.5000 mg | Freq: Once | INTRAMUSCULAR | Status: DC | PRN

## 2018-12-10 MED ORDER — OXALIPLATIN CHEMO INJECTION 100 MG/20ML
66.0000 mg/m2 | Freq: Once | INTRAVENOUS | Status: AC
  Administered 2018-12-10: 150 mg via INTRAVENOUS
  Filled 2018-12-10: qty 20

## 2018-12-10 MED ORDER — HEPARIN SOD (PORK) LOCK FLUSH 100 UNIT/ML IV SOLN
500.0000 [IU] | Freq: Once | INTRAVENOUS | Status: DC | PRN
  Filled 2018-12-10: qty 5

## 2018-12-10 MED ORDER — SODIUM CHLORIDE 0.9 % IV SOLN
1800.0000 mg/m2 | INTRAVENOUS | Status: DC
  Administered 2018-12-10: 4050 mg via INTRAVENOUS
  Filled 2018-12-10: qty 81

## 2018-12-10 MED ORDER — ATROPINE SULFATE 0.4 MG/ML IJ SOLN
0.4000 mg | Freq: Once | INTRAMUSCULAR | Status: AC | PRN
  Administered 2018-12-10: 0.4 mg via INTRAVENOUS

## 2018-12-10 MED ORDER — SODIUM CHLORIDE 0.9 % IV SOLN
300.0000 mg/m2 | Freq: Once | INTRAVENOUS | Status: DC
  Filled 2018-12-10: qty 33.8

## 2018-12-10 MED ORDER — SODIUM CHLORIDE 0.9% FLUSH
10.0000 mL | INTRAVENOUS | Status: DC | PRN
  Administered 2018-12-10: 10 mL
  Filled 2018-12-10: qty 10

## 2018-12-10 MED ORDER — IRINOTECAN HCL CHEMO INJECTION 100 MG/5ML: 90.0000 mg/m2 | Freq: Once | INTRAVENOUS | Status: DC

## 2018-12-10 NOTE — Telephone Encounter (Signed)
Scheduled appt per 6/8 los.  Patient will get an appt calendar while in treatment.

## 2018-12-10 NOTE — Progress Notes (Signed)
[facility] OFFICE PROGRESS NOTE   Diagnosis:  Pancreas cancer  INTERVAL HISTORY:   Mr. Hutchinson returns as scheduled.  He denies fever and chills.  He describes his appetite as "fair".  He is having several soft stools a day.  He has taken a few doses of an antidiarrheal.  He feels somewhat "bloated".  He has mild intermittent nausea.  No vomiting.  Objective:  Vital signs in last 24 hours:  Blood pressure 122/74, pulse (!) 57, temperature 98.3 F (36.8 C), temperature source Oral, resp. rate 18, height '6\' 6"'$  (1.981 m), weight 200 lb 3.2 oz (90.8 kg), SpO2 99 %.   GI: Abdomen is mildly distended.  No hepatomegaly. Vascular: No leg edema. Skin: Ecchymoses scattered over the dorsum of both hands. Port-A-Cath without erythema.  Lab Results:  Lab Results  Component Value Date   WBC 6.2 12/10/2018   HGB 11.5 (L) 12/10/2018   HCT 35.2 (L) 12/10/2018   MCV 86.7 12/10/2018   PLT 116 (L) 12/10/2018   NEUTROABS 5.5 12/10/2018    Imaging:  No results found.  Medications: I have reviewed the patient's current medications.  Assessment/Plan: 1. Pancreas head mass  CT abdomen/pelvis 08/03/2017-fullness in the head of the pancreas with stranding in the peripancreatic fat and pancreatic ductal dilatation.   MRI abdomen 08/04/2017-findings favored to represent acute on chronic pancreatitis; equivocal soft tissue fullness within the head and uncinate process of the pancreas; indeterminate too small to characterize right hepatic lobe lesion; small volume abdominal ascites; splenomegaly.   CA-19-9 elevated at 977on 08/04/2017.  Status postupper EUS 08/10/2017-findings of an irregular masslike region in the pancreatic head measuring approximately 2.7 cm. This directly abutted the main portal vein but no other major vascular structures. Biopsies obtained with preliminary cytology positive for malignancy, likely adenocarcinoma. The final report is pending. The common bile  duct and main pancreatic duct were both dilated. ERCP was then proceeded with forstent placement. Multiple attempts were made tocannulate the bile duct without success. The procedure was aborted.  08/16/2017 ERCP with placement of a metal biliary stent in the common bile duct by Dr. Francella Solian at [facility].  Cycle 1 FOLFIRINOX 08/31/2017  Cycle 2 FOLFIRINOX 09/14/2017  Cycle 3 FOLFIRINOX 09/28/2017  Cycle 4 FOLFIRINOX 10/12/2017 (oxaliplatin dose reduced secondary to thrombocytopenia)  Cycle 5 FOLFIRINOX 10/26/2017  Cycle 6 FOLFIRINOX 11/09/2017  Restaging CTs at [facility] 11/29/2017-no definitive evidence of distant metastatic disease. 2 subcentimeter liver lesions described on prior MRI not visualized on CT. Ill-defined pancreatic head mass stable to decreased in size measuring 2.1 x 2 cm. Peripancreatic inflammatory stranding decreased from prior. No biliary ductal dilatation. Celiac axis less than 180 degrees abutment. Common hepatic artery with greater than 180 degrees encasement; superior mesenteric artery with short segment less than 180 degrees abutment; portal vein/superior mesenteric vein with greater than 180 degrees encasement of the extrahepatic portal vein with associated circumferential narrowing at the portomesenteric, overall substantially improved from prior examination. Now less than 180 degree abutment of the SMV. The portal vein and SMV remain patent. Splenic vein patent.  Cycle 1 gemcitabine/Abraxane 12/06/2017  Cycle 2 gemcitabine/Abraxane 12/20/2017  Cycle 3 gemcitabine/Abraxane 01/03/2018  Cycle 4 gemcitabine/Abraxane 01/17/2018  Cycle 5 gemcitabine 01/31/2018 (Abraxane held due to neuropathy)  Cycle 6 gemcitabine 02/14/2018 (Abraxane held due to neuropathy)  Cycle 7 gemcitabine 02/28/2018 (Abraxane held due to neuropathy)  CT chest/abdomen/pelvis at [facility] 03/08/2018- stable appearance of previously identified infiltrating pancreatic head mass. No CT evidence of metastatic  disease.  SBRT  to the pancreas 04/05/2018-04/11/2018  05/09/2018 CA-19-9 2138   MRI abdomen 05/09/2018- similar 6 mm hypoenhancing focus posterior right lobe liver; more superior lesion near the dome of the liver not identified on the postcontrast imaging but there is a persistent focus of diffusion signal abnormality in this region; new 8 mm hypoenhancing focus located just superior to the gallbladder; additional tiny focus of diffusion abnormality located more superiorly without obvious associated abnormal enhancement. Ill-defined pancreatic head mass not felt to be significantly changed.  CT chest/abdomen/pelvis 05/10/2018- ill-defined hypoattenuating pancreatic head mass and ill-defined soft tissue abutting the celiac axis with mildly increased dilatation of the main pancreatic duct. New soft tissue nodule along the right upper lobe bronchus; hypoattenuating liver lesions concerning for metastasis.  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 The stent traverses the pancreatic mass and extends into the duodenum. Increase in size of 2 liver lesions. Pancreatic mass appears similar. Indistinct tissue planes around the duodenum along with wall thickening in the gallbladder and especially the gallbladder neck.  MRI/MRCP 08/08/2018-new and enlarging hepatic metastases, increase in size of the pancreatic head tumor, mild intrahepatic biliary dilatation, thrombosis in segment 7 portal vein tributary, flattening of the main portal vein  ERCP 08/16/2018- 2 plastic biliary stents were removed from within the previously placed partially covered SEMS; stricture  confirmed just proximal to the previously placed SEMS, treated with placement of an 8 cm long 10 mm diameter uncovered SEMS in good position.  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  Diffuse hepatic metastasis.  Previous index lesions unchanged in size.  Single new metastasis identified within the medial segment left lobe of liver.  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  Abdominal ascites increased in the interval.  Cycle 7 FOLFIRINOX 12/10/2018 2. Hypertension 3. Port-A-Cath placement 08/30/2017 4. History of elevated bilirubin-questionGilbert's syndrome 5. Mild leukopenia, thrombocytopenia-question secondary to portal hypertension/splenomegaly;09/14/2017 white count improved, platelet count stable;progressive thrombocytopenia following gemcitabine/Abraxane 6. Kidney stones 7. Fever following cycles 2 and 3 gemcitabine/Abraxane,? Fever related to gemcitabine 8.Jaundice, elevated liver enzymes and bilirubin 06/22/2018; status post ERCP 06/25/2018 with debris removed from the existing stent and a new  plastic stent placed. Recurrent jaundice 07/19/2018 status post ERCP 07/20/2018 with finding that the previously placed plastic stent had migrated distally. There was debris and sludge on the stent. The previously placed stent was removed. 5 to 8 mm stricture just proximal to the existing SEMS. 2 plastic stents were placed.ERCP 08/16/2018-  2 plastic biliary stents were removed from within the previously placed partially covered SEMS; stricture confirmed just proximal to the previously placed SEMS, treated with placement of an 8 cm long 10 mm diameter uncovered SEMS in good position. 9.Hospitalized with bacteremia Streptococcus group C 11/04/2018 through 11/08/2018. IV ceftriaxone through 11/17/2018. TEE 11/08/2018 negative. 10.  Elevated LFTs 12/03/2018- MRI abdomen 12/04/2018 with mild increase in size of mass involving head of the pancreas.  Diffuse hepatic metastasis.  Previous index lesions unchanged in size.  Single new metastasis identified within the medial segment left lobe of liver.  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  Abdominal ascites increased in the interval.  12/06/2018 ERCP-previously placed uncovered 8 cm long metal biliary stent nearly filled with bio debris and flecks of tissue ingrowth.  Stent cleared.   Disposition: Mr. Nouri appears stable.  He has completed 6 cycles of FOLFIRINOX.  Cycle 7 was held last week due to elevated LFTs.  He underwent an ERCP on 12/06/2018 with the stent noted to be nearly filled with debris and flecks of tissue ingrowth.  The stent was cleared.  We reviewed the labs from today.  The LFTs have improved.  Plan to proceed with cycle 7 FOLFIRINOX today as scheduled.  He will return for lab, follow-up, cycle 8 FOLFIRINOX in 3 weeks.  He will contact the office in the interim with any problems.  Patient seen with Dr. Benay Spice.    Ned Card ANP/GNP-BC   12/10/2018  11:58 AM This was a shared visit with Ned Card.  The  liver enzymes are improved after replacement of the biliary stent. The MRI reveals stable disease in the liver aside from a single small new lesion in segment 4 and there is increased ascites.  The CA 19-9 remains improved when he was here on 12/03/2018.  The plan is to continue FOLFIRINOX with close clinical follow-up.  Julieanne Manson, MD

## 2018-12-11 ENCOUNTER — Telehealth: Payer: Self-pay | Admitting: *Deleted

## 2018-12-11 ENCOUNTER — Telehealth: Payer: Self-pay

## 2018-12-11 LAB — CANCER ANTIGEN 19-9: CA 19-9: 841 U/mL — ABNORMAL HIGH (ref 0–35)

## 2018-12-11 NOTE — Telephone Encounter (Signed)
"  Cory Kelly's wife Cory Kelly [phone]).  Received chemotherapy yesterday.  Has been nauseated through the night and never experienced nausea before.  Used compazine at 1:00 and 7:00 am but it doesn't help.  No vomiting.   Also burping quite a bit.  Burping is painful to his upper abdomen and chest.   What is he to do?"  Shared nausea indigestion and other side effects may be cumulative and change during treatment.  Notifying provider; will return call with any recommendations or orders.

## 2018-12-11 NOTE — Telephone Encounter (Signed)
Returned TC to patients wife Cory Kelly [phone]) in regard to patients new nausea symptoms. I let her know that I spoke with Lattie Haw about the issue and that Lattie Haw recommended he try Zofran. Wife stated that he did have some Zofran available and that she would give that a try and if it still doesn't help that she would give [place] a call back. She also stated that the burping he was experencering could be due to indigestion. No further problems or concerns at this time.

## 2018-12-12 ENCOUNTER — Inpatient Hospital Stay: Payer: BC Managed Care – PPO

## 2018-12-12 ENCOUNTER — Other Ambulatory Visit: Payer: Self-pay

## 2018-12-12 VITALS — BP 128/72 | HR 67 | Temp 98.2°F | Resp 17

## 2018-12-12 DIAGNOSIS — Z5189 Encounter for other specified aftercare: Secondary | ICD-10-CM | POA: Diagnosis not present

## 2018-12-12 DIAGNOSIS — I10 Essential (primary) hypertension: Secondary | ICD-10-CM | POA: Diagnosis not present

## 2018-12-12 DIAGNOSIS — Z5111 Encounter for antineoplastic chemotherapy: Secondary | ICD-10-CM | POA: Diagnosis not present

## 2018-12-12 DIAGNOSIS — C250 Malignant neoplasm of head of pancreas: Secondary | ICD-10-CM | POA: Diagnosis not present

## 2018-12-12 DIAGNOSIS — R945 Abnormal results of liver function studies: Secondary | ICD-10-CM | POA: Diagnosis not present

## 2018-12-12 DIAGNOSIS — C787 Secondary malignant neoplasm of liver and intrahepatic bile duct: Secondary | ICD-10-CM | POA: Diagnosis not present

## 2018-12-12 MED ORDER — SODIUM CHLORIDE 0.9% FLUSH
10.0000 mL | INTRAVENOUS | Status: DC | PRN
  Administered 2018-12-12: 10 mL
  Filled 2018-12-12: qty 10

## 2018-12-12 MED ORDER — PEGFILGRASTIM-CBQV 6 MG/0.6ML ~~LOC~~ SOSY
6.0000 mg | PREFILLED_SYRINGE | Freq: Once | SUBCUTANEOUS | Status: AC
  Administered 2018-12-12: 6 mg via SUBCUTANEOUS

## 2018-12-12 MED ORDER — HEPARIN SOD (PORK) LOCK FLUSH 100 UNIT/ML IV SOLN
500.0000 [IU] | Freq: Once | INTRAVENOUS | Status: AC | PRN
  Administered 2018-12-12: 500 [IU]
  Filled 2018-12-12: qty 5

## 2018-12-12 MED ORDER — PEGFILGRASTIM-CBQV 6 MG/0.6ML ~~LOC~~ SOSY
PREFILLED_SYRINGE | SUBCUTANEOUS | Status: AC
  Filled 2018-12-12: qty 0.6

## 2018-12-12 NOTE — Patient Instructions (Signed)
Pegfilgrastim injection  What is this medicine?  PEGFILGRASTIM (PEG fil gra stim) is a long-acting granulocyte colony-stimulating factor that stimulates the growth of neutrophils, a type of white blood cell important in the body's fight against infection. It is used to reduce the incidence of fever and infection in patients with certain types of cancer who are receiving chemotherapy that affects the bone marrow, and to increase survival after being exposed to high doses of radiation.  This medicine may be used for other purposes; ask your health care provider or pharmacist if you have questions.  COMMON BRAND NAME(S): Fulphila, Neulasta, UDENYCA  What should I tell my health care provider before I take this medicine?  They need to know if you have any of these conditions:  -kidney disease  -latex allergy  -ongoing radiation therapy  -sickle cell disease  -skin reactions to acrylic adhesives (On-Body Injector only)  -an unusual or allergic reaction to pegfilgrastim, filgrastim, other medicines, foods, dyes, or preservatives  -pregnant or trying to get pregnant  -breast-feeding  How should I use this medicine?  This medicine is for injection under the skin. If you get this medicine at home, you will be taught how to prepare and give the pre-filled syringe or how to use the On-body Injector. Refer to the patient Instructions for Use for detailed instructions. Use exactly as directed. Tell your healthcare provider immediately if you suspect that the On-body Injector may not have performed as intended or if you suspect the use of the On-body Injector resulted in a missed or partial dose.  It is important that you put your used needles and syringes in a special sharps container. Do not put them in a trash can. If you do not have a sharps container, call your pharmacist or healthcare provider to get one.  Talk to your pediatrician regarding the use of this medicine in children. While this drug may be prescribed for  selected conditions, precautions do apply.  Overdosage: If you think you have taken too much of this medicine contact a poison control center or emergency room at once.  NOTE: This medicine is only for you. Do not share this medicine with others.  What if I miss a dose?  It is important not to miss your dose. Call your doctor or health care professional if you miss your dose. If you miss a dose due to an On-body Injector failure or leakage, a new dose should be administered as soon as possible using a single prefilled syringe for manual use.  What may interact with this medicine?  Interactions have not been studied.  Give your health care provider a list of all the medicines, herbs, non-prescription drugs, or dietary supplements you use. Also tell them if you smoke, drink alcohol, or use illegal drugs. Some items may interact with your medicine.  This list may not describe all possible interactions. Give your health care provider a list of all the medicines, herbs, non-prescription drugs, or dietary supplements you use. Also tell them if you smoke, drink alcohol, or use illegal drugs. Some items may interact with your medicine.  What should I watch for while using this medicine?  You may need blood work done while you are taking this medicine.  If you are going to need a MRI, CT scan, or other procedure, tell your doctor that you are using this medicine (On-Body Injector only).  What side effects may I notice from receiving this medicine?  Side effects that you should report to   your doctor or health care professional as soon as possible:  -allergic reactions like skin rash, itching or hives, swelling of the face, lips, or tongue  -back pain  -dizziness  -fever  -pain, redness, or irritation at site where injected  -pinpoint red spots on the skin  -red or dark-brown urine  -shortness of breath or breathing problems  -stomach or side pain, or pain at the shoulder  -swelling  -tiredness  -trouble passing urine or  change in the amount of urine  Side effects that usually do not require medical attention (report to your doctor or health care professional if they continue or are bothersome):  -bone pain  -muscle pain  This list may not describe all possible side effects. Call your doctor for medical advice about side effects. You may report side effects to FDA at 1-800-FDA-1088.  Where should I keep my medicine?  Keep out of the reach of children.  If you are using this medicine at home, you will be instructed on how to store it. Throw away any unused medicine after the expiration date on the label.  NOTE: This sheet is a summary. It may not cover all possible information. If you have questions about this medicine, talk to your doctor, pharmacist, or health care provider.   2019 [employer] (2017-09-25 16:57:08)

## 2018-12-13 ENCOUNTER — Telehealth: Payer: Self-pay | Admitting: *Deleted

## 2018-12-13 NOTE — Telephone Encounter (Addendum)
Wife calling for CA 19.9 results from 12/10/18 (not released in Mychart yet). Per Dr. Benay Spice: higher result is not a surprise due to stent work can cause increase in this result. Will recheck on 12/31/18. Wife notified of this on her voice mail. Was not able to reach patient.

## 2018-12-17 ENCOUNTER — Telehealth: Payer: Self-pay | Admitting: *Deleted

## 2018-12-17 ENCOUNTER — Emergency Department (HOSPITAL_COMMUNITY)
Admission: EM | Admit: 2018-12-17 | Discharge: 2018-12-17 | Disposition: A | Discharge status: Home / Self Care | Payer: BC Managed Care – PPO | Attending: Emergency Medicine | Admitting: Emergency Medicine

## 2018-12-17 ENCOUNTER — Encounter (HOSPITAL_COMMUNITY): Payer: Self-pay

## 2018-12-17 ENCOUNTER — Other Ambulatory Visit: Payer: Self-pay

## 2018-12-17 DIAGNOSIS — R110 Nausea: Secondary | ICD-10-CM

## 2018-12-17 DIAGNOSIS — R0902 Hypoxemia: Secondary | ICD-10-CM | POA: Diagnosis not present

## 2018-12-17 DIAGNOSIS — R410 Disorientation, unspecified: Secondary | ICD-10-CM | POA: Diagnosis not present

## 2018-12-17 DIAGNOSIS — Z79899 Other long term (current) drug therapy: Secondary | ICD-10-CM | POA: Insufficient documentation

## 2018-12-17 DIAGNOSIS — R52 Pain, unspecified: Secondary | ICD-10-CM | POA: Diagnosis not present

## 2018-12-17 DIAGNOSIS — I10 Essential (primary) hypertension: Secondary | ICD-10-CM | POA: Diagnosis not present

## 2018-12-17 DIAGNOSIS — C259 Malignant neoplasm of pancreas, unspecified: Secondary | ICD-10-CM | POA: Insufficient documentation

## 2018-12-17 DIAGNOSIS — Z209 Contact with and (suspected) exposure to unspecified communicable disease: Secondary | ICD-10-CM | POA: Diagnosis not present

## 2018-12-17 MED ORDER — ONDANSETRON 4 MG PO TBDP
4.0000 mg | ORAL_TABLET | Freq: Once | ORAL | Status: AC
  Administered 2018-12-17: 4 mg via ORAL
  Filled 2018-12-17: qty 1

## 2018-12-17 MED ORDER — CIPROFLOXACIN HCL 500 MG PO TABS: 500.0000 mg | ORAL_TABLET | Freq: Two times a day (BID) | ORAL | 0 refills | Status: DC

## 2018-12-17 MED ORDER — ONDANSETRON 4 MG PO TBDP: ORAL_TABLET | ORAL | 0 refills | Status: DC

## 2018-12-17 MED ORDER — CIPROFLOXACIN HCL 500 MG PO TABS
500.0000 mg | ORAL_TABLET | Freq: Once | ORAL | Status: AC
  Administered 2018-12-17: 500 mg via ORAL
  Filled 2018-12-17: qty 1

## 2018-12-17 NOTE — Telephone Encounter (Signed)
"  Please call Cheo Selvey ([phone]) about Cory Kelly, D.O.B. August 22, 1955.  Thank you."

## 2018-12-17 NOTE — ED Notes (Signed)
Bed: WA08 Expected date:  Expected time:  Means of arrival:  Comments: EMS CA pt, emesis

## 2018-12-17 NOTE — ED Provider Notes (Signed)
[facility] DEPT Provider Note   CSN: [id number] Arrival date & time: 12/17/18  1423     History   Chief Complaint Chief Complaint  Patient presents with  . Fatigue  . Nausea    HPI Cory Kelly is a 63 y.o. male.     Patient complains of nausea and some shakes today.  He has a history of pancreatic cancer and got chemo a week ago  The history is provided by the patient.  Weakness Severity:  Mild Onset quality:  Sudden Timing:  Intermittent Progression:  Waxing and waning Chronicity:  New Context: not alcohol use   Relieved by:  Nothing Worsened by:  Nothing Ineffective treatments:  None tried Associated symptoms: no abdominal pain, no chest pain, no cough, no diarrhea, no frequency, no headaches and no seizures   Risk factors: no anemia     Past Medical History:  Diagnosis Date  . Abdominal pain    due to bloating  . Bloating   . Cancer ([place])    skin cancer  . Essential hypertension   . Fatigue   . History of kidney stones    passed  . History of weight loss   . HOH (hard of hearing)    no hearing aids  . Neuralgia   . Pancreatic cancer ([place])   . Stress    loss of father in Dec 2018.    Patient Active Problem List   Diagnosis Date Noted  . Bacteremia   . Fever, unspecified 11/05/2018  . Episode of transient neurologic symptoms 11/04/2018  . Biliary stent migration, initial encounter   . Pancreatic cancer ([place]) 07/20/2018  . 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 Jaundice 07/20/2018  . History of ERCP 07/20/2018  . Mass of pancreas 07/20/2018  . Goals of care, counseling/discussion 07/04/2018  . Cholangitis 06/22/2018  . Port-A-Cath in place 08/31/2017  . 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[place]) 08/21/2017  . Abnormal pancreas function test   . Pancreatic abnormality   . 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 Elevated LFTs 08/04/2017  . 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 Elevated serum creatinine  11/20/2012  . Elevated PSA 03/05/2012  . Essential hypertension 11/02/2011  . Hyperlipemia 11/02/2011    Past Surgical History:  Procedure Laterality Date  . APPENDECTOMY    . BILIARY STENT PLACEMENT N/A 06/25/2018   Procedure: BILIARY STENT PLACEMENT;  Surgeon: Rush Landmark Telford Nab., MD;  Location: [facility] ENDOSCOPY;  Service: Gastroenterology;  Laterality: N/A;  . BILIARY STENT PLACEMENT  07/20/2018   Procedure: BILIARY STENT PLACEMENT;  Surgeon: Milus Banister, MD;  Location: [facility] ENDOSCOPY;  Service: Gastroenterology;;  . BILIARY STENT PLACEMENT N/A 08/16/2018   Procedure: BILIARY STENT PLACEMENT;  Surgeon: Milus Banister, MD;  Location: [facility] ENDOSCOPY;  Service: Endoscopy;  Laterality: N/A;  . ENDOSCOPIC RETROGRADE CHOLANGIOPANCREATOGRAPHY (ERCP) WITH PROPOFOL N/A 08/10/2017   Procedure: ENDOSCOPIC RETROGRADE CHOLANGIOPANCREATOGRAPHY (ERCP) WITH PROPOFOL;  Surgeon: Milus Banister, MD;  Location: [facility] ENDOSCOPY;  Service: Endoscopy;  Laterality: N/A;  . ENDOSCOPIC RETROGRADE CHOLANGIOPANCREATOGRAPHY (ERCP) WITH PROPOFOL N/A 07/20/2018   Procedure: ENDOSCOPIC RETROGRADE CHOLANGIOPANCREATOGRAPHY (ERCP) WITH PROPOFOL;  Surgeon: Milus Banister, MD;  Location: [facility] ENDOSCOPY;  Service: Gastroenterology;  Laterality: N/A;  Balloon Sweeps  . ENDOSCOPIC RETROGRADE CHOLANGIOPANCREATOGRAPHY (ERCP) WITH PROPOFOL N/A 08/16/2018   Procedure: ENDOSCOPIC RETROGRADE CHOLANGIOPANCREATOGRAPHY (ERCP) WITH PROPOFOL;  Surgeon: Milus Banister, MD;  Location: [facility] ENDOSCOPY;  Service: Endoscopy;  Laterality: N/A;  . ENDOSCOPIC RETROGRADE CHOLANGIOPANCREATOGRAPHY (ERCP) WITH PROPOFOL N/A  12/06/2018   Procedure: ENDOSCOPIC RETROGRADE CHOLANGIOPANCREATOGRAPHY (ERCP) WITH PROPOFOL;  Surgeon: Milus Banister, MD;  Location: [facility] ENDOSCOPY;  Service: Endoscopy;  Laterality: N/A;  cbd stent sweep  . ERCP N/A 06/25/2018   Procedure: ENDOSCOPIC RETROGRADE CHOLANGIOPANCREATOGRAPHY (ERCP);  Surgeon: Irving Copas., MD;   Location: [facility] ENDOSCOPY;  Service: Gastroenterology;  Laterality: N/A;  . EUS N/A 08/10/2017   Procedure: UPPER ENDOSCOPIC ULTRASOUND (EUS) RADIAL;  Surgeon: Milus Banister, MD;  Location: [facility] ENDOSCOPY;  Service: Endoscopy;  Laterality: N/A;  . EYE SURGERY     lasik/left eye  . IR FLUORO GUIDE PORT INSERTION RIGHT  08/30/2017  . IR US GUIDE VASC ACCESS RIGHT  08/30/2017  . REMOVAL OF STONES  06/25/2018   Procedure: REMOVAL OF STONES;  Surgeon: Rush Landmark Telford Nab., MD;  Location: [facility] ENDOSCOPY;  Service: Gastroenterology;;  . SKIN CANCER EXCISION    . STENT REMOVAL  07/20/2018   Procedure: STENT REMOVAL;  Surgeon: Milus Banister, MD;  Location: [facility] ENDOSCOPY;  Service: Gastroenterology;;  . Lavell Islam REMOVAL  08/16/2018   Procedure: STENT REMOVAL;  Surgeon: Milus Banister, MD;  Location: [facility] ENDOSCOPY;  Service: Endoscopy;;  . TEE WITHOUT CARDIOVERSION N/A 11/08/2018   Procedure: TRANSESOPHAGEAL ECHOCARDIOGRAM (TEE);  Surgeon: Acie Fredrickson Wonda Cheng, MD;  Location: [facility] ENDOSCOPY;  Service: Cardiovascular;  Laterality: N/A;        Home Medications    Prior to Admission medications   Medication Sig Start Date End Date Taking? Authorizing Provider  amLODipine (NORVASC) 10 MG tablet Take 10 mg by mouth at bedtime.  05/21/18   [provider]  ciprofloxacin (CIPRO) 500 MG tablet Take 1 tablet (500 mg total) by mouth 2 (two) times daily. 12/17/18   Milton Ferguson, MD  doxazosin (CARDURA) 1 MG tablet Take 1 mg by mouth at bedtime.    [provider]  gabapentin (NEURONTIN) 100 MG capsule Take 2 capsules (200 mg total) by mouth 2 (two) times daily. 12/03/18   Owens Shark, NP  lidocaine-prilocaine (EMLA) cream Apply 1 application topically as needed. Apply to portacath site 1-2 hours prior to use Patient taking differently: Apply 1 application topically daily as needed (1-2 hours prior to portacath access).  08/20/18   Owens Shark, NP  loperamide (IMODIUM A-D) 2 MG tablet Take 1 tablet (2  mg total) by mouth as directed. Patient taking differently: Take 2-4 mg by mouth 3 (three) times daily as needed for diarrhea or loose stools.  08/22/18   Ladell Pier, MD  olmesartan (BENICAR) 40 MG tablet Take 1 tablet (40 mg total) by mouth daily. 04/04/18   Gale Journey, Damaris Hippo, PA-C  ondansetron (ZOFRAN ODT) 4 MG disintegrating tablet 4mg  ODT q4 hours prn nausea/vomit 12/17/18   Milton Ferguson, MD  ondansetron (ZOFRAN) 4 MG tablet Take 1 tablet (4 mg total) by mouth every 6 (six) hours as needed for nausea. 06/25/18   Roxan Hockey, MD  oxyCODONE-acetaminophen (PERCOCET) 10-325 MG tablet Take 1 tablet by mouth every 4 (four) hours as needed for pain. 12/03/18   Owens Shark, NP  potassium chloride SA (K-DUR,KLOR-CON) 20 MEQ tablet Take 1 tablet (20 mEq total) by mouth 2 (two) times daily. 10/16/18   Ladell Pier, MD  [facility] ACETATE EX Apply 1 application topically daily as needed (itching).    [provider]  prochlorperazine (COMPAZINE) 10 MG tablet Take 10 mg by mouth every 6 (six) hours as needed for nausea or vomiting.    [provider]  Family History Family History  Problem Relation Age of Onset  . Hyperlipidemia Father   . Heart disease Father   . Dementia Father   . Memory loss Mother   . Colon cancer Neg Hx   . Esophageal cancer Neg Hx   . Inflammatory bowel disease Neg Hx   . Liver disease Neg Hx   . Rectal cancer Neg Hx   . Stomach cancer Neg Hx   . Pancreatic cancer Neg Hx     Social History Social History   Tobacco Use  . Smoking status: Never Smoker  . Smokeless tobacco: Never Used  Substance Use Topics  . Alcohol use: No  . Drug use: No     Allergies   Patient has no known allergies.   Review of Systems Review of Systems  Constitutional: Negative for appetite change and fatigue.  HENT: Negative for congestion, ear discharge and sinus pressure.   Eyes: Negative for discharge.  Respiratory: Negative for cough.    Cardiovascular: Negative for chest pain.  Gastrointestinal: Negative for abdominal pain and diarrhea.  Genitourinary: Negative for frequency and hematuria.  Musculoskeletal: Negative for back pain.  Skin: Negative for rash.  Neurological: Positive for weakness. Negative for seizures and headaches.  Psychiatric/Behavioral: Negative for hallucinations.     Physical Exam Updated Vital Signs BP 119/65   Pulse 96   Temp 100 F (37.8 C) (Oral)   Resp 16   Ht 6\' 5"  (1.956 m)   Wt 90.8 kg   SpO2 99%   BMI 23.74 kg/m   Physical Exam Vitals signs reviewed.  Constitutional:      Appearance: He is well-developed.  HENT:     Head: Normocephalic.     Nose: Nose normal.  Eyes:     General: No scleral icterus.    Conjunctiva/sclera: Conjunctivae normal.  Neck:     Musculoskeletal: Neck supple.     Thyroid: No thyromegaly.  Cardiovascular:     Rate and Rhythm: Normal rate and regular rhythm.     Heart sounds: No murmur. No friction rub. No gallop.   Pulmonary:     Breath sounds: No stridor. No wheezing or rales.  Chest:     Chest wall: No tenderness.  Abdominal:     General: There is no distension.     Tenderness: There is no abdominal tenderness. There is no rebound.  Musculoskeletal: Normal range of motion.  Lymphadenopathy:     Cervical: No cervical adenopathy.  Skin:    Findings: No erythema or rash.  Neurological:     Mental Status: He is oriented to person, place, and time.     Motor: No abnormal muscle tone.     Coordination: Coordination normal.  Psychiatric:        Behavior: Behavior normal.      ED Treatments / Results  Labs (all labs ordered are listed, but only abnormal results are displayed) Labs Reviewed - No data to display  EKG    Radiology No results found.  Procedures Procedures (including critical care time)  Medications Ordered in ED Medications  ondansetron (ZOFRAN-ODT) disintegrating tablet 4 mg (has no administration in time range)   ciprofloxacin (CIPRO) tablet 500 mg (has no administration in time range)     Initial Impression / Assessment and Plan / ED Course  I have reviewed the triage vital signs and the nursing notes.  Pertinent labs & imaging results that were available during my care of the patient were reviewed by me and considered  in my medical decision making (see chart for details).        Patient with some nausea and some shaking.  Mild fever at 100.  Patient does not want any tests done he wants to go home and contact his oncologist tomorrow.  I spoke with his oncologist and he stated to put him on Cipro and Zofran and if the patient has problems tonight he can call his oncologist Dr. Benay Spice he stated he would be on call.  But he needs to call the office in the morning and tell them how he is doing.  Final Clinical Impressions(s) / ED Diagnoses   Final diagnoses:  Nausea    ED Discharge Orders         Ordered    ciprofloxacin (CIPRO) 500 MG tablet  2 times daily     12/17/18 1703    ondansetron (ZOFRAN ODT) 4 MG disintegrating tablet     12/17/18 1703           Milton Ferguson, MD 12/17/18 1709

## 2018-12-17 NOTE — ED Triage Notes (Signed)
EMS reports from work, Pancreatic CA PT, weakness and nausea today. Chemo Pt last treatment Wednesday.  BP 114/76 HR 90 RR 20 Sp02 95 RA CBG 103

## 2018-12-17 NOTE — Telephone Encounter (Signed)
Wife called to report she called 23 and he is being transported to [facility] emergency room. Had fever, shaking chills, vomiting, disoriented and too weak to even stand. Wanted Dr. Benay Spice aware. MD will be notified upon return to office.

## 2018-12-17 NOTE — Discharge Instructions (Addendum)
Call Dr. Gearldine Shown office and speak to [name] tomorrow morning and let her know how you are doing.  If you get worse tonight with fevers chills or any other problems Dr. Benay Spice  wants you to call him he is on call tonight

## 2018-12-18 ENCOUNTER — Telehealth: Payer: Self-pay | Admitting: *Deleted

## 2018-12-18 NOTE — Telephone Encounter (Signed)
Patient reports no fever or N/V or diarrhea. No chills or pain. No jaundice. Eating well. He will call with any change in condition. Is taking cipro as prescribed.

## 2018-12-20 ENCOUNTER — Telehealth: Payer: Self-pay | Admitting: *Deleted

## 2018-12-20 NOTE — Telephone Encounter (Addendum)
Wife left message that he is having a lot of pain and some bleeding with bowel movements. Patient verbalized to her "sometimes it hurts so bad I want to cry". Requested nurse call patient. Attempted to call patient-left VM to call back on 6/28 and 6/19

## 2018-12-24 ENCOUNTER — Inpatient Hospital Stay (HOSPITAL_BASED_OUTPATIENT_CLINIC_OR_DEPARTMENT_OTHER): Payer: BC Managed Care – PPO | Admitting: Nurse Practitioner

## 2018-12-24 ENCOUNTER — Encounter (HOSPITAL_COMMUNITY): Payer: Self-pay

## 2018-12-24 ENCOUNTER — Other Ambulatory Visit: Payer: Self-pay

## 2018-12-24 ENCOUNTER — Inpatient Hospital Stay (HOSPITAL_COMMUNITY)
Admission: EM | Admit: 2018-12-24 | Discharge: 2018-12-27 | DRG: 871 | Disposition: A | Discharge status: Home Health | Payer: BC Managed Care – PPO | Attending: Internal Medicine | Admitting: Internal Medicine

## 2018-12-24 ENCOUNTER — Inpatient Hospital Stay: Payer: BC Managed Care – PPO

## 2018-12-24 ENCOUNTER — Telehealth: Payer: Self-pay | Admitting: *Deleted

## 2018-12-24 ENCOUNTER — Emergency Department (HOSPITAL_COMMUNITY): Payer: BC Managed Care – PPO

## 2018-12-24 ENCOUNTER — Encounter: Payer: Self-pay | Admitting: Nurse Practitioner

## 2018-12-24 VITALS — BP 113/69 | HR 81 | Temp 98.6°F | Resp 18 | Ht 77.0 in | Wt 195.3 lb

## 2018-12-24 DIAGNOSIS — A419 Sepsis, unspecified organism: Secondary | ICD-10-CM | POA: Diagnosis not present

## 2018-12-24 DIAGNOSIS — N400 Enlarged prostate without lower urinary tract symptoms: Secondary | ICD-10-CM | POA: Diagnosis present

## 2018-12-24 DIAGNOSIS — C250 Malignant neoplasm of head of pancreas: Secondary | ICD-10-CM

## 2018-12-24 DIAGNOSIS — D61810 Antineoplastic chemotherapy induced pancytopenia: Secondary | ICD-10-CM | POA: Diagnosis not present

## 2018-12-24 DIAGNOSIS — R180 Malignant ascites: Secondary | ICD-10-CM | POA: Diagnosis not present

## 2018-12-24 DIAGNOSIS — I10 Essential (primary) hypertension: Secondary | ICD-10-CM | POA: Diagnosis present

## 2018-12-24 DIAGNOSIS — H9190 Unspecified hearing loss, unspecified ear: Secondary | ICD-10-CM | POA: Diagnosis present

## 2018-12-24 DIAGNOSIS — B955 Unspecified streptococcus as the cause of diseases classified elsewhere: Secondary | ICD-10-CM | POA: Diagnosis not present

## 2018-12-24 DIAGNOSIS — R531 Weakness: Secondary | ICD-10-CM | POA: Diagnosis not present

## 2018-12-24 DIAGNOSIS — G629 Polyneuropathy, unspecified: Secondary | ICD-10-CM | POA: Diagnosis not present

## 2018-12-24 DIAGNOSIS — A409 Streptococcal sepsis, unspecified: Secondary | ICD-10-CM | POA: Diagnosis not present

## 2018-12-24 DIAGNOSIS — R4182 Altered mental status, unspecified: Secondary | ICD-10-CM | POA: Diagnosis not present

## 2018-12-24 DIAGNOSIS — C787 Secondary malignant neoplasm of liver and intrahepatic bile duct: Secondary | ICD-10-CM | POA: Diagnosis not present

## 2018-12-24 DIAGNOSIS — Z452 Encounter for adjustment and management of vascular access device: Secondary | ICD-10-CM | POA: Diagnosis not present

## 2018-12-24 DIAGNOSIS — T451X5A Adverse effect of antineoplastic and immunosuppressive drugs, initial encounter: Secondary | ICD-10-CM | POA: Diagnosis present

## 2018-12-24 DIAGNOSIS — R6520 Severe sepsis without septic shock: Secondary | ICD-10-CM | POA: Diagnosis not present

## 2018-12-24 DIAGNOSIS — Z7984 Long term (current) use of oral hypoglycemic drugs: Secondary | ICD-10-CM

## 2018-12-24 DIAGNOSIS — D6959 Other secondary thrombocytopenia: Secondary | ICD-10-CM | POA: Diagnosis present

## 2018-12-24 DIAGNOSIS — K769 Liver disease, unspecified: Secondary | ICD-10-CM

## 2018-12-24 DIAGNOSIS — N200 Calculus of kidney: Secondary | ICD-10-CM | POA: Diagnosis present

## 2018-12-24 DIAGNOSIS — Z1159 Encounter for screening for other viral diseases: Secondary | ICD-10-CM | POA: Diagnosis not present

## 2018-12-24 DIAGNOSIS — R509 Fever, unspecified: Secondary | ICD-10-CM

## 2018-12-24 DIAGNOSIS — Z85828 Personal history of other malignant neoplasm of skin: Secondary | ICD-10-CM | POA: Diagnosis not present

## 2018-12-24 DIAGNOSIS — Z95828 Presence of other vascular implants and grafts: Secondary | ICD-10-CM

## 2018-12-24 DIAGNOSIS — Z87442 Personal history of urinary calculi: Secondary | ICD-10-CM

## 2018-12-24 DIAGNOSIS — R69 Illness, unspecified: Secondary | ICD-10-CM

## 2018-12-24 DIAGNOSIS — R161 Splenomegaly, not elsewhere classified: Secondary | ICD-10-CM | POA: Diagnosis not present

## 2018-12-24 DIAGNOSIS — K5900 Constipation, unspecified: Secondary | ICD-10-CM | POA: Diagnosis present

## 2018-12-24 DIAGNOSIS — N179 Acute kidney failure, unspecified: Secondary | ICD-10-CM | POA: Diagnosis not present

## 2018-12-24 DIAGNOSIS — R140 Abdominal distension (gaseous): Secondary | ICD-10-CM | POA: Diagnosis not present

## 2018-12-24 DIAGNOSIS — Z8249 Family history of ischemic heart disease and other diseases of the circulatory system: Secondary | ICD-10-CM

## 2018-12-24 DIAGNOSIS — D61818 Other pancytopenia: Secondary | ICD-10-CM | POA: Diagnosis not present

## 2018-12-24 DIAGNOSIS — R188 Other ascites: Secondary | ICD-10-CM | POA: Diagnosis not present

## 2018-12-24 DIAGNOSIS — R410 Disorientation, unspecified: Secondary | ICD-10-CM

## 2018-12-24 DIAGNOSIS — Z5111 Encounter for antineoplastic chemotherapy: Secondary | ICD-10-CM | POA: Diagnosis not present

## 2018-12-24 DIAGNOSIS — R7881 Bacteremia: Secondary | ICD-10-CM | POA: Diagnosis not present

## 2018-12-24 DIAGNOSIS — Z79899 Other long term (current) drug therapy: Secondary | ICD-10-CM | POA: Diagnosis not present

## 2018-12-24 DIAGNOSIS — Z20828 Contact with and (suspected) exposure to other viral communicable diseases: Secondary | ICD-10-CM | POA: Diagnosis not present

## 2018-12-24 DIAGNOSIS — R911 Solitary pulmonary nodule: Secondary | ICD-10-CM

## 2018-12-24 DIAGNOSIS — C259 Malignant neoplasm of pancreas, unspecified: Secondary | ICD-10-CM | POA: Diagnosis not present

## 2018-12-24 LAB — CMP (CANCER CENTER ONLY)
ALT: 73 U/L — ABNORMAL HIGH (ref 0–44)
AST: 40 U/L (ref 15–41)
Albumin: 3 g/dL — ABNORMAL LOW (ref 3.5–5.0)
Alkaline Phosphatase: 754 U/L — ABNORMAL HIGH (ref 38–126)
Anion gap: 15 (ref 5–15)
BUN: 6 mg/dL — ABNORMAL LOW (ref 8–23)
CO2: 20 mmol/L — ABNORMAL LOW (ref 22–32)
Calcium: 8.9 mg/dL (ref 8.9–10.3)
Chloride: 100 mmol/L (ref 98–111)
Creatinine: 0.94 mg/dL (ref 0.61–1.24)
GFR, Est AFR Am: >60 mL/min (ref >60)
GFR, Estimated: >60 mL/min (ref >60)
Glucose, Bld: 132 mg/dL — ABNORMAL HIGH (ref 70–99)
Potassium: 4.8 mmol/L (ref 3.5–5.1)
Sodium: 135 mmol/L (ref 135–145)
Total Bilirubin: 0.8 mg/dL (ref 0.3–1.2)
Total Protein: 6.3 g/dL — ABNORMAL LOW (ref 6.5–8.1)

## 2018-12-24 LAB — CBC WITH DIFFERENTIAL (CANCER CENTER ONLY)
Abs Immature Granulocytes: 0.06 10*3/uL (ref 0.00–0.07)
Basophils Absolute: 0 10*3/uL (ref 0.0–0.1)
Basophils Relative: 0 %
Eosinophils Absolute: 0 10*3/uL (ref 0.0–0.5)
Eosinophils Relative: 0 %
HCT: 37 % — ABNORMAL LOW (ref 39.0–52.0)
Hemoglobin: 12 g/dL — ABNORMAL LOW (ref 13.0–17.0)
Immature Granulocytes: 1 %
Lymphocytes Relative: 9 %
Lymphs Abs: 0.8 10*3/uL (ref 0.7–4.0)
MCH: 28.4 pg (ref 26.0–34.0)
MCHC: 32.4 g/dL (ref 30.0–36.0)
MCV: 87.5 fL (ref 80.0–100.0)
Monocytes Absolute: 0.1 10*3/uL (ref 0.1–1.0)
Monocytes Relative: 2 %
Neutro Abs: 8.2 10*3/uL — ABNORMAL HIGH (ref 1.7–7.7)
Neutrophils Relative %: 88 %
Platelet Count: 169 10*3/uL (ref 150–400)
RBC: 4.23 MIL/uL (ref 4.22–5.81)
RDW: 14.6 % (ref 11.5–15.5)
WBC Count: 9.3 10*3/uL (ref 4.0–10.5)
nRBC: 0 % (ref 0.0–0.2)

## 2018-12-24 LAB — URINALYSIS, ROUTINE W REFLEX MICROSCOPIC
Bacteria, UA: NONE SEEN
Bilirubin Urine: NEGATIVE
Glucose, UA: 50 mg/dL — AB
Ketones, ur: NEGATIVE mg/dL
Leukocytes,Ua: NEGATIVE
Nitrite: NEGATIVE
Protein, ur: 30 mg/dL — AB
Specific Gravity, Urine: 1.016 (ref 1.005–1.030)
pH: 5 (ref 5.0–8.0)

## 2018-12-24 LAB — LACTIC ACID, PLASMA: Lactic Acid, Venous: 1.7 mmol/L (ref 0.5–1.9)

## 2018-12-24 LAB — SARS CORONAVIRUS 2 BY RT PCR (HOSPITAL ORDER, PERFORMED IN ~~LOC~~ HOSPITAL LAB): SARS Coronavirus 2: NEGATIVE

## 2018-12-24 LAB — LIPASE, BLOOD: Lipase: 24 U/L (ref 11–51)

## 2018-12-24 LAB — LACTATE DEHYDROGENASE: LDH: 148 U/L (ref 98–192)

## 2018-12-24 MED ORDER — ACETAMINOPHEN 325 MG PO TABS
650.0000 mg | ORAL_TABLET | Freq: Once | ORAL | Status: AC
  Administered 2018-12-24: 650 mg via ORAL
  Filled 2018-12-24: qty 2

## 2018-12-24 MED ORDER — ENOXAPARIN SODIUM 40 MG/0.4ML ~~LOC~~ SOLN
40.0000 mg | SUBCUTANEOUS | Status: DC
  Administered 2018-12-24: 40 mg via SUBCUTANEOUS
  Filled 2018-12-24: qty 0.4

## 2018-12-24 MED ORDER — VANCOMYCIN HCL IN DEXTROSE 1-5 GM/200ML-% IV SOLN
1000.0000 mg | Freq: Once | INTRAVENOUS | Status: AC
  Administered 2018-12-24: 15:00:00 1000 mg via INTRAVENOUS
  Filled 2018-12-24: qty 200

## 2018-12-24 MED ORDER — SODIUM CHLORIDE (PF) 0.9 % IJ SOLN
INTRAMUSCULAR | Status: AC
  Filled 2018-12-24: qty 50

## 2018-12-24 MED ORDER — SODIUM CHLORIDE 0.9 % IV SOLN
INTRAVENOUS | Status: DC
  Administered 2018-12-24 – 2018-12-26 (×4): via INTRAVENOUS

## 2018-12-24 MED ORDER — PIPERACILLIN-TAZOBACTAM 3.375 G IVPB 30 MIN
3.3750 g | Freq: Once | INTRAVENOUS | Status: AC
  Administered 2018-12-24: 3.375 g via INTRAVENOUS
  Filled 2018-12-24: qty 50

## 2018-12-24 MED ORDER — ACETAMINOPHEN 325 MG PO TABS
650.0000 mg | ORAL_TABLET | Freq: Four times a day (QID) | ORAL | Status: DC | PRN
  Administered 2018-12-24 – 2018-12-25 (×2): 650 mg via ORAL
  Filled 2018-12-24 (×2): qty 2

## 2018-12-24 MED ORDER — ONDANSETRON HCL 4 MG/2ML IJ SOLN: 4.0000 mg | Freq: Four times a day (QID) | INTRAMUSCULAR | Status: DC | PRN

## 2018-12-24 MED ORDER — POLYETHYLENE GLYCOL 3350 17 G PO PACK
17.0000 g | PACK | Freq: Every day | ORAL | Status: DC
  Filled 2018-12-24: qty 1

## 2018-12-24 MED ORDER — OXYCODONE-ACETAMINOPHEN 10-325 MG PO TABS: 1.0000 | ORAL_TABLET | Freq: Four times a day (QID) | ORAL | Status: DC | PRN

## 2018-12-24 MED ORDER — OXYCODONE-ACETAMINOPHEN 5-325 MG PO TABS
1.0000 | ORAL_TABLET | Freq: Four times a day (QID) | ORAL | Status: DC | PRN
  Administered 2018-12-25 – 2018-12-26 (×3): 1 via ORAL
  Filled 2018-12-24 (×3): qty 1

## 2018-12-24 MED ORDER — VANCOMYCIN HCL 10 G IV SOLR
1250.0000 mg | Freq: Two times a day (BID) | INTRAVENOUS | Status: DC
  Administered 2018-12-25 – 2018-12-26 (×5): 1250 mg via INTRAVENOUS
  Filled 2018-12-24 (×6): qty 1250

## 2018-12-24 MED ORDER — OXYCODONE HCL 5 MG PO TABS
5.0000 mg | ORAL_TABLET | Freq: Four times a day (QID) | ORAL | Status: DC | PRN
  Administered 2018-12-24 – 2018-12-26 (×3): 5 mg via ORAL
  Filled 2018-12-24 (×3): qty 1

## 2018-12-24 MED ORDER — IOHEXOL 300 MG/ML  SOLN
100.0000 mL | Freq: Once | INTRAMUSCULAR | Status: AC | PRN
  Administered 2018-12-24: 100 mL via INTRAVENOUS

## 2018-12-24 MED ORDER — SODIUM CHLORIDE 0.9 % IV SOLN
2.0000 g | Freq: Three times a day (TID) | INTRAVENOUS | Status: DC
  Administered 2018-12-24 – 2018-12-27 (×8): 2 g via INTRAVENOUS
  Filled 2018-12-24 (×9): qty 2

## 2018-12-24 MED ORDER — SODIUM CHLORIDE 0.9 % IV BOLUS
1000.0000 mL | Freq: Once | INTRAVENOUS | Status: AC
  Administered 2018-12-24: 1000 mL via INTRAVENOUS

## 2018-12-24 NOTE — ED Provider Notes (Signed)
[facility] DEPT Provider Note   CSN: [id number] Arrival date & time: 12/24/18  1253    History   Chief Complaint Chief Complaint  Patient presents with   Altered Mental Status    HPI Cory Kelly is a 63 y.o. male.     The history is provided by the patient and a caregiver.  Fever Max temp prior to arrival:  101 Temp source:  Axillary Severity:  Mild Onset quality:  Gradual Timing:  Intermittent Progression:  Waxing and waning Chronicity:  New Relieved by:  Nothing Worsened by:  Nothing Associated symptoms: confusion   Risk factors: hx of cancer (pancreatic cancer, patient with biliary stent. )     Past Medical History:  Diagnosis Date   Abdominal pain    due to bloating   Bloating    Cancer (HCC)    skin cancer   Essential hypertension    Fatigue    History of kidney stones    passed   History of weight loss    HOH (hard of hearing)    no hearing aids   Neuralgia    Pancreatic cancer [facility])    Stress    loss of father in Dec 2018.    Patient Active Problem List   Diagnosis Date Noted   Sepsis ([place]) 12/24/2018   Bacteremia    Fever, unspecified 11/05/2018   Episode of transient neurologic symptoms 11/04/2018   Biliary stent migration, initial encounter    Pancreatic cancer ([place]) 07/20/2018   Disorder of bile duct stent 07/20/2018   Jaundice 07/20/2018   History of ERCP 07/20/2018   Mass of pancreas 07/20/2018   Goals of care, counseling/discussion 07/04/2018   Cholangitis 06/22/2018   Port-A-Cath in place 08/31/2017   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[place]) 08/21/2017   Abnormal pancreas function test    Pancreatic abnormality    History Basal cell adenocarcinoma 08/04/2017   Elevated LFTs 08/04/2017   Tinnitus-bilat 03/05/2014   Elevated serum creatinine 11/20/2012   Elevated PSA 03/05/2012   Essential hypertension 11/02/2011   Hyperlipemia 11/02/2011    Past Surgical  History:  Procedure Laterality Date   APPENDECTOMY     BILIARY STENT PLACEMENT N/A 06/25/2018   Procedure: BILIARY STENT PLACEMENT;  Surgeon: Irving Copas., MD;  Location: [facility] ENDOSCOPY;  Service: Gastroenterology;  Laterality: N/A;   BILIARY STENT PLACEMENT  07/20/2018   Procedure: BILIARY STENT PLACEMENT;  Surgeon: Milus Banister, MD;  Location: [facility] ENDOSCOPY;  Service: Gastroenterology;;   BILIARY STENT PLACEMENT N/A 08/16/2018   Procedure: BILIARY STENT PLACEMENT;  Surgeon: Milus Banister, MD;  Location: [facility] ENDOSCOPY;  Service: Endoscopy;  Laterality: N/A;   ENDOSCOPIC RETROGRADE CHOLANGIOPANCREATOGRAPHY (ERCP) WITH PROPOFOL N/A 08/10/2017   Procedure: ENDOSCOPIC RETROGRADE CHOLANGIOPANCREATOGRAPHY (ERCP) WITH PROPOFOL;  Surgeon: Milus Banister, MD;  Location: [facility] ENDOSCOPY;  Service: Endoscopy;  Laterality: N/A;   ENDOSCOPIC RETROGRADE CHOLANGIOPANCREATOGRAPHY (ERCP) WITH PROPOFOL N/A 07/20/2018   Procedure: ENDOSCOPIC RETROGRADE CHOLANGIOPANCREATOGRAPHY (ERCP) WITH PROPOFOL;  Surgeon: Milus Banister, MD;  Location: [facility] ENDOSCOPY;  Service: Gastroenterology;  Laterality: N/A;  Balloon Sweeps   ENDOSCOPIC RETROGRADE CHOLANGIOPANCREATOGRAPHY (ERCP) WITH PROPOFOL N/A 08/16/2018   Procedure: ENDOSCOPIC RETROGRADE CHOLANGIOPANCREATOGRAPHY (ERCP) WITH PROPOFOL;  Surgeon: Milus Banister, MD;  Location: [facility] ENDOSCOPY;  Service: Endoscopy;  Laterality: N/A;   ENDOSCOPIC RETROGRADE CHOLANGIOPANCREATOGRAPHY (ERCP) WITH PROPOFOL N/A 12/06/2018   Procedure: ENDOSCOPIC RETROGRADE CHOLANGIOPANCREATOGRAPHY (ERCP) WITH PROPOFOL;  Surgeon: Milus Banister, MD;  Location: [facility] ENDOSCOPY;  Service: Endoscopy;  Laterality:  N/A;  cbd stent sweep   ERCP N/A 06/25/2018   Procedure: ENDOSCOPIC RETROGRADE CHOLANGIOPANCREATOGRAPHY (ERCP);  Surgeon: Irving Copas., MD;  Location: [facility] ENDOSCOPY;  Service: Gastroenterology;  Laterality: N/A;   EUS N/A 08/10/2017   Procedure: UPPER ENDOSCOPIC  ULTRASOUND (EUS) RADIAL;  Surgeon: Milus Banister, MD;  Location: [facility] ENDOSCOPY;  Service: Endoscopy;  Laterality: N/A;   EYE SURGERY     lasik/left eye   IR FLUORO GUIDE PORT INSERTION RIGHT  08/30/2017   IR US GUIDE VASC ACCESS RIGHT  08/30/2017   REMOVAL OF STONES  06/25/2018   Procedure: REMOVAL OF STONES;  Surgeon: Irving Copas., MD;  Location: [facility] ENDOSCOPY;  Service: Gastroenterology;;   SKIN CANCER EXCISION     STENT REMOVAL  07/20/2018   Procedure: STENT REMOVAL;  Surgeon: Milus Banister, MD;  Location: [facility] ENDOSCOPY;  Service: Gastroenterology;;   Lavell Islam REMOVAL  08/16/2018   Procedure: STENT REMOVAL;  Surgeon: Milus Banister, MD;  Location: [facility] ENDOSCOPY;  Service: Endoscopy;;   TEE WITHOUT CARDIOVERSION N/A 11/08/2018   Procedure: TRANSESOPHAGEAL ECHOCARDIOGRAM (TEE);  Surgeon: Acie Fredrickson Wonda Cheng, MD;  Location: [facility] ENDOSCOPY;  Service: Cardiovascular;  Laterality: N/A;        Home Medications    Prior to Admission medications   Medication Sig Start Date End Date Taking? Authorizing Provider  amLODipine (NORVASC) 10 MG tablet Take 10 mg by mouth at bedtime.  05/21/18  Yes [provider]  ciprofloxacin (CIPRO) 500 MG tablet Take 1 tablet (500 mg total) by mouth 2 (two) times daily. 12/17/18  Yes Milton Ferguson, MD  doxazosin (CARDURA) 1 MG tablet Take 1 mg by mouth at bedtime.   Yes [provider]  gabapentin (NEURONTIN) 100 MG capsule Take 2 capsules (200 mg total) by mouth 2 (two) times daily. 12/03/18  Yes Owens Shark, NP  lidocaine-prilocaine (EMLA) cream Apply 1 application topically as needed. Apply to portacath site 1-2 hours prior to use Patient taking differently: Apply 1 application topically daily as needed (1-2 hours prior to portacath access).  08/20/18  Yes Owens Shark, NP  loperamide (IMODIUM A-D) 2 MG tablet Take 1 tablet (2 mg total) by mouth as directed. Patient taking differently: Take 2-4 mg by mouth 3 (three) times daily as  needed for diarrhea or loose stools.  08/22/18  Yes Ladell Pier, MD  olmesartan (BENICAR) 40 MG tablet Take 1 tablet (40 mg total) by mouth daily. 04/04/18  Yes Weber, Sarah L, PA-C  ondansetron (ZOFRAN) 4 MG tablet Take 1 tablet (4 mg total) by mouth every 6 (six) hours as needed for nausea. 06/25/18  Yes Emokpae, Courage, MD  oxyCODONE-acetaminophen (PERCOCET) 10-325 MG tablet Take 1 tablet by mouth every 4 (four) hours as needed for pain. 12/03/18  Yes Owens Shark, NP  potassium chloride SA (K-DUR,KLOR-CON) 20 MEQ tablet Take 1 tablet (20 mEq total) by mouth 2 (two) times daily. 10/16/18  Yes Ladell Pier, MD  [facility] ACETATE EX Apply 1 application topically daily as needed (itching).   Yes [provider]  prochlorperazine (COMPAZINE) 10 MG tablet Take 10 mg by mouth every 6 (six) hours as needed for nausea or vomiting.   Yes [provider]  ondansetron (ZOFRAN ODT) 4 MG disintegrating tablet 4mg  ODT q4 hours prn nausea/vomit Patient not taking: Reported on 12/24/2018 12/17/18   Milton Ferguson, MD    Family History Family History  Problem Relation Age of Onset   Hyperlipidemia Father  Heart disease Father    Dementia Father    Memory loss Mother    Colon cancer Neg Hx    Esophageal cancer Neg Hx    Inflammatory bowel disease Neg Hx    Liver disease Neg Hx    Rectal cancer Neg Hx    Stomach cancer Neg Hx    Pancreatic cancer Neg Hx     Social History Social History   Tobacco Use   Smoking status: Never Smoker   Smokeless tobacco: Never Used  Substance Use Topics   Alcohol use: No   Drug use: No     Allergies   Patient has no known allergies.   Review of Systems Review of Systems  Unable to perform ROS: Mental status change  Constitutional: Positive for fever.  Psychiatric/Behavioral: Positive for confusion.     Physical Exam Updated Vital Signs  ED Triage Vitals [12/24/18 1318]  Enc Vitals Group     BP 132/70       Pulse Rate (!) 108     Resp 17     Temp (!) 103.1 F (39.5 C)     Temp Source Oral     SpO2 94 %     Weight      Height      Head Circumference      Peak Flow      Pain Score      Pain Loc      Pain Edu?      Excl. in [place]?     Physical Exam Vitals signs and nursing note reviewed.  Constitutional:      Appearance: He is well-developed. He is ill-appearing.  HENT:     Head: Normocephalic and atraumatic.     Nose: Nose normal.     Mouth/Throat:     Mouth: Mucous membranes are moist.  Eyes:     Extraocular Movements: Extraocular movements intact.     Conjunctiva/sclera: Conjunctivae normal.     Pupils: Pupils are equal, round, and reactive to light.  Neck:     Musculoskeletal: Neck supple.  Cardiovascular:     Rate and Rhythm: Regular rhythm. Tachycardia present.     Pulses: Normal pulses.     Heart sounds: Normal heart sounds. No murmur.  Pulmonary:     Effort: Pulmonary effort is normal. No respiratory distress.     Breath sounds: Normal breath sounds.  Abdominal:     General: There is no distension.     Palpations: Abdomen is soft.     Tenderness: There is no abdominal tenderness.  Skin:    General: Skin is warm and dry.     Capillary Refill: Capillary refill takes less than 2 seconds.  Neurological:     General: No focal deficit present.     Mental Status: He is alert.     Comments: confused      ED Treatments / Results  Labs (all labs ordered are listed, but only abnormal results are displayed) Labs Reviewed  URINALYSIS, ROUTINE W REFLEX MICROSCOPIC - Abnormal; Notable for the following components:      Result Value   Glucose, UA 50 (*)    Hgb urine dipstick MODERATE (*)    Protein, ur 30 (*)    All other components within normal limits  SARS CORONAVIRUS 2 (HOSPITAL ORDER, [place] [facility])  URINE CULTURE  CULTURE, BLOOD (ROUTINE X 2)  CULTURE, BLOOD (ROUTINE X 2)  LIPASE, BLOOD  LACTIC ACID, PLASMA  EKG None  Radiology Ct Abdomen Pelvis W Contrast  Result Date: 12/24/2018 CLINICAL DATA:  Abdominal distension, fever, history of pancreatic cancer EXAM: CT ABDOMEN AND PELVIS WITH CONTRAST TECHNIQUE: Multidetector CT imaging of the abdomen and pelvis was performed using the standard protocol following bolus administration of intravenous contrast. CONTRAST:  128mL OMNIPAQUE IOHEXOL 300 MG/ML  SOLN COMPARISON:  MR abdomen pelvis, 12/04/2018, 10/30/2018 FINDINGS: Lower chest: No acute abnormality. Hepatobiliary: Known liver metastases are poorly appreciated by single-phase CT, for example in the dome of the liver (series 2, image 14). No gallstones or gallbladder wall thickening. Common bile duct stent is positioned tip in the descending portion of the duodenum with post stenting pneumobilia. Pancreas: Discrete pancreatic head mass is not well appreciated, primarily appreciated by mass effect on the confluence of the portal vein (series 2, image 38. There is atrophy of the pancreatic body and tail and dilatation pancreatic duct. Spleen: Splenomegaly. Adrenals/Urinary Tract: Adrenal glands are unremarkable. Nonobstructive bilateral nephrolithiasis. Bladder is unremarkable. Stomach/Bowel: Stomach is within normal limits. Appendix is not clearly visualized. No evidence of bowel wall thickening, distention, or inflammatory changes. Large burden of stool in the distal colon and rectum. Vascular/Lymphatic: Aortic atherosclerosis. No enlarged abdominal or pelvic lymph nodes. Reproductive: No mass or other significant abnormality. Other: No abdominal wall hernia or abnormality. Moderate volume ascites. Musculoskeletal: No acute or significant osseous findings. IMPRESSION: 1. Redemonstrated pancreatic malignancy with hepatic metastases, status post common bile duct stenting. Primary mass and hepatic metastases are better appreciated by recent multiphasic MRI. 2.  Moderate volume ascites, which is similar to prior  MRI. 3.  Splenomegaly. 4.  Large burden of stool in the distal colon and rectum. 5.  Nonobstructive bilateral nephrolithiasis. Electronically Signed   By: Eddie Candle M.D.   On: 12/24/2018 14:35   Dg Chest Portable 1 View  Result Date: 12/24/2018 CLINICAL DATA:  Fever and weakness. EXAM: PORTABLE CHEST 1 VIEW COMPARISON:  11/04/2018 FINDINGS: The cardiac silhouette, mediastinal and hilar contours are stable. Mild cardiac enlargement and mild tortuosity of the thoracic aorta. The right IJ power port is stable. The lungs are clear. No pleural effusions. The bony thorax is intact. IMPRESSION: No acute cardiopulmonary findings. Electronically Signed   By: Marijo Sanes M.D.   On: 12/24/2018 13:59    Procedures .Critical Care Performed by: Lennice Sites, DO Authorized by: Lennice Sites, DO   Critical care provider statement:    Critical care time (minutes):  45   Critical care was necessary to treat or prevent imminent or life-threatening deterioration of the following conditions:  Sepsis   Critical care was time spent personally by me on the following activities:  Blood draw for specimens, development of treatment plan with patient or surrogate, discussions with primary provider, evaluation of patient's response to treatment, examination of patient, obtaining history from patient or surrogate, ordering and review of laboratory studies, ordering and performing treatments and interventions, ordering and review of radiographic studies, pulse oximetry, re-evaluation of patient's condition and review of old charts   I assumed direction of critical care for this patient from another provider in my specialty: no     (including critical care time)  Medications Ordered in ED Medications  vancomycin (VANCOCIN) IVPB 1000 mg/200 mL premix (1,000 mg Intravenous New Bag/Given 12/24/18 1440)  sodium chloride (PF) 0.9 % injection (has no administration in time range)  0.9 %  sodium chloride infusion (has no  administration in time range)  acetaminophen (TYLENOL) tablet 650 mg (has no administration  in time range)  enoxaparin (LOVENOX) injection 40 mg (has no administration in time range)  oxyCODONE-acetaminophen (PERCOCET/ROXICET) 5-325 MG per tablet 1 tablet (has no administration in time range)    And  oxyCODONE (Oxy IR/ROXICODONE) immediate release tablet 5 mg (has no administration in time range)  sodium chloride 0.9 % bolus 1,000 mL (1,000 mLs Intravenous New Bag/Given 12/24/18 1328)  acetaminophen (TYLENOL) tablet 650 mg (650 mg Oral Given 12/24/18 1339)  piperacillin-tazobactam (ZOSYN) IVPB 3.375 g (0 g Intravenous Stopped 12/24/18 1440)  iohexol (OMNIPAQUE) 300 MG/ML solution 100 mL (100 mLs Intravenous Contrast Given 12/24/18 1403)     Initial Impression / Assessment and Plan / ED Course  I have reviewed the triage vital signs and the nursing notes.  Pertinent labs & imaging results that were available during my care of the patient were reviewed by me and considered in my medical decision making (see chart for details).     Cory Kelly is a 63 year old male with history of pancreatic cancer status post biliary stent who presents to the ED with fever, altered mental status from cancer clinic.  Patient arrives febrile to 103, tachycardic to 108.  Otherwise normal vitals.  Patient has had fevers for the last several days.  Has been on ciprofloxacin.  Has had bacteremia in the past.  Patient has had issues with biliary stents as well.  Patient very confused on my examination.  Sepsis work-up has been initiated.  Patient ready with blood cultures, CBC, CMP with cancer center.  Patient with no significant leukocytosis.  No neutropenia.  Liver enzymes are improved.  Lipase normal.  Urinalysis showed no signs of infection.  Chest x-ray with no signs of infection.  Coronavirus test negative.  CT scan of the abdomen pelvis showed no acute findings.  Has stable amount of ascites.  After IV fluids,  Tylenol.  Patient with no abdominal tenderness on exam.  No signs of peritonitis.  Patient after Tylenol and IV fluids with improved mentation.  Suspect that he may have an occult bacteremia given that he has a stent and a port.  Patient was empirically covered with IV Zosyn, IV vancomycin.  Admitted for further sepsis care.  This chart was dictated using voice recognition software.  Despite best efforts to proofread,  errors can occur which can change the documentation meaning.    Final Clinical Impressions(s) / ED Diagnoses   Final diagnoses:  Sepsis, due to unspecified organism, unspecified whether acute organ dysfunction present [facility])  Confusion    ED Discharge Orders    None       Lennice Sites, DO 12/24/18 1523

## 2018-12-24 NOTE — Progress Notes (Signed)
A consult was received from an ED physician for vancomycin per pharmacy dosing.  The patient's profile has been reviewed for ht/wt/allergies/indication/available labs.    A one time order has been placed for vancomycin 1000 mg IV x1.  Further antibiotics/pharmacy consults should be ordered by admitting physician if indicated.                       Thank you, Lynelle Doctor 12/24/2018  1:45 PM

## 2018-12-24 NOTE — H&P (Signed)
History and Physical    Cory Kelly IWP:[id number] DOB: [birth year] DOA: 12/24/2018  PCP: Mancel Bale, PA-C   Patient coming from: Cancer center    Chief Complaint: Fever, chills  HPI: Cory Kelly is a 63 y.o. male with medical history significant of pancreatic carcinoma with hepatic metastasis, status post biliary stent hypertension, BPH who presents from cancer center for the evaluation of possible sepsis.  Patient reported that he has been feeling weak and was having fever and chills since a week.Family also reported that he was confused at home.  Patient was taking ciprofloxacin as given by his oncologist.  He was seen at cancer center about a week ago with similar complaint but he did not want to be admitted. When patient presented to the emergency department, he was found to be febrile, tachycardic and confused.  He responded quickly to IV antibiotics and IV fluids.  Patient was also complaining of generalized weakness, poor appetite. Patient seen and examined the bedside in the emergency department.  He was mildly hypotensive during my evaluation.  He denies any chest pain, shortness of breath, cough, abdomen pain, nausea, vomiting, diarrhea.  Denies any exposure to person tested positive for COVID-19.  ED Course: Found to be febrile on presentation.  CMP showed elevated alkaline phosphatase.  Lactic acid level normal.  Chest x-ray did not show any pneumonia.  CT abdomen/pelvis showed pancreatic malignancy with hepatic metastases, status post common bile duct stenting, moderate volume ascites.  Patient was started on broad-spectrum antibiotics.  Culture sent.  Review of Systems: As per HPI otherwise 10 point review of systems negative.    Past Medical History:  Diagnosis Date   Abdominal pain    due to bloating   Bloating    Cancer (HCC)    skin cancer   Essential hypertension    Fatigue    History of kidney stones    passed   History of weight loss    HOH (hard  of hearing)    no hearing aids   Neuralgia    Pancreatic cancer [facility])    Stress    loss of father in Dec 2018.    Past Surgical History:  Procedure Laterality Date   APPENDECTOMY     BILIARY STENT PLACEMENT N/A 06/25/2018   Procedure: BILIARY STENT PLACEMENT;  Surgeon: Irving Copas., MD;  Location: [facility] ENDOSCOPY;  Service: Gastroenterology;  Laterality: N/A;   BILIARY STENT PLACEMENT  07/20/2018   Procedure: BILIARY STENT PLACEMENT;  Surgeon: Milus Banister, MD;  Location: [facility] ENDOSCOPY;  Service: Gastroenterology;;   BILIARY STENT PLACEMENT N/A 08/16/2018   Procedure: BILIARY STENT PLACEMENT;  Surgeon: Milus Banister, MD;  Location: [facility] ENDOSCOPY;  Service: Endoscopy;  Laterality: N/A;   ENDOSCOPIC RETROGRADE CHOLANGIOPANCREATOGRAPHY (ERCP) WITH PROPOFOL N/A 08/10/2017   Procedure: ENDOSCOPIC RETROGRADE CHOLANGIOPANCREATOGRAPHY (ERCP) WITH PROPOFOL;  Surgeon: Milus Banister, MD;  Location: [facility] ENDOSCOPY;  Service: Endoscopy;  Laterality: N/A;   ENDOSCOPIC RETROGRADE CHOLANGIOPANCREATOGRAPHY (ERCP) WITH PROPOFOL N/A 07/20/2018   Procedure: ENDOSCOPIC RETROGRADE CHOLANGIOPANCREATOGRAPHY (ERCP) WITH PROPOFOL;  Surgeon: Milus Banister, MD;  Location: [facility] ENDOSCOPY;  Service: Gastroenterology;  Laterality: N/A;  Balloon Sweeps   ENDOSCOPIC RETROGRADE CHOLANGIOPANCREATOGRAPHY (ERCP) WITH PROPOFOL N/A 08/16/2018   Procedure: ENDOSCOPIC RETROGRADE CHOLANGIOPANCREATOGRAPHY (ERCP) WITH PROPOFOL;  Surgeon: Milus Banister, MD;  Location: [facility] ENDOSCOPY;  Service: Endoscopy;  Laterality: N/A;   ENDOSCOPIC RETROGRADE CHOLANGIOPANCREATOGRAPHY (ERCP) WITH PROPOFOL N/A 12/06/2018   Procedure: ENDOSCOPIC RETROGRADE CHOLANGIOPANCREATOGRAPHY (ERCP) WITH PROPOFOL;  Surgeon: Milus Banister,  MD;  Location: [facility] ENDOSCOPY;  Service: Endoscopy;  Laterality: N/A;  cbd stent sweep   ERCP N/A 06/25/2018   Procedure: ENDOSCOPIC RETROGRADE CHOLANGIOPANCREATOGRAPHY (ERCP);  Surgeon: Irving Copas., MD;  Location: [facility] ENDOSCOPY;  Service: Gastroenterology;  Laterality: N/A;   EUS N/A 08/10/2017   Procedure: UPPER ENDOSCOPIC ULTRASOUND (EUS) RADIAL;  Surgeon: Milus Banister, MD;  Location: [facility] ENDOSCOPY;  Service: Endoscopy;  Laterality: N/A;   EYE SURGERY     lasik/left eye   IR FLUORO GUIDE PORT INSERTION RIGHT  08/30/2017   IR US GUIDE VASC ACCESS RIGHT  08/30/2017   REMOVAL OF STONES  06/25/2018   Procedure: REMOVAL OF STONES;  Surgeon: Irving Copas., MD;  Location: [facility] ENDOSCOPY;  Service: Gastroenterology;;   SKIN CANCER EXCISION     STENT REMOVAL  07/20/2018   Procedure: STENT REMOVAL;  Surgeon: Milus Banister, MD;  Location: [facility] ENDOSCOPY;  Service: Gastroenterology;;   Lavell Islam REMOVAL  08/16/2018   Procedure: STENT REMOVAL;  Surgeon: Milus Banister, MD;  Location: [facility] ENDOSCOPY;  Service: Endoscopy;;   TEE WITHOUT CARDIOVERSION N/A 11/08/2018   Procedure: TRANSESOPHAGEAL ECHOCARDIOGRAM (TEE);  Surgeon: Acie Fredrickson Wonda Cheng, MD;  Location: [facility] ENDOSCOPY;  Service: Cardiovascular;  Laterality: N/A;     reports that he has never smoked. He has never used smokeless tobacco. He reports that he does not drink alcohol or use drugs.  No Known Allergies  Family History  Problem Relation Age of Onset   Hyperlipidemia Father    Heart disease Father    Dementia Father    Memory loss Mother    Colon cancer Neg Hx    Esophageal cancer Neg Hx    Inflammatory bowel disease Neg Hx    Liver disease Neg Hx    Rectal cancer Neg Hx    Stomach cancer Neg Hx    Pancreatic cancer Neg Hx      Prior to Admission medications   Medication Sig Start Date End Date Taking? Authorizing Provider  amLODipine (NORVASC) 10 MG tablet Take 10 mg by mouth at bedtime.  05/21/18  Yes [provider]  ciprofloxacin (CIPRO) 500 MG tablet Take 1 tablet (500 mg total) by mouth 2 (two) times daily. 12/17/18  Yes Milton Ferguson, MD  doxazosin (CARDURA) 1 MG tablet Take 1 mg by  mouth at bedtime.   Yes [provider]  gabapentin (NEURONTIN) 100 MG capsule Take 2 capsules (200 mg total) by mouth 2 (two) times daily. 12/03/18  Yes Owens Shark, NP  lidocaine-prilocaine (EMLA) cream Apply 1 application topically as needed. Apply to portacath site 1-2 hours prior to use Patient taking differently: Apply 1 application topically daily as needed (1-2 hours prior to portacath access).  08/20/18  Yes Owens Shark, NP  loperamide (IMODIUM A-D) 2 MG tablet Take 1 tablet (2 mg total) by mouth as directed. Patient taking differently: Take 2-4 mg by mouth 3 (three) times daily as needed for diarrhea or loose stools.  08/22/18  Yes Ladell Pier, MD  olmesartan (BENICAR) 40 MG tablet Take 1 tablet (40 mg total) by mouth daily. 04/04/18  Yes Weber, Sarah L, PA-C  ondansetron (ZOFRAN) 4 MG tablet Take 1 tablet (4 mg total) by mouth every 6 (six) hours as needed for nausea. 06/25/18  Yes Emokpae, Courage, MD  oxyCODONE-acetaminophen (PERCOCET) 10-325 MG tablet Take 1 tablet by mouth every 4 (four) hours as needed for pain. 12/03/18  Yes Owens Shark, NP  potassium chloride SA (  K-DUR,KLOR-CON) 20 MEQ tablet Take 1 tablet (20 mEq total) by mouth 2 (two) times daily. 10/16/18  Yes Ladell Pier, MD  [facility] ACETATE EX Apply 1 application topically daily as needed (itching).   Yes [provider]  prochlorperazine (COMPAZINE) 10 MG tablet Take 10 mg by mouth every 6 (six) hours as needed for nausea or vomiting.   Yes [provider]  ondansetron (ZOFRAN ODT) 4 MG disintegrating tablet 4mg  ODT q4 hours prn nausea/vomit Patient not taking: Reported on 12/24/2018 12/17/18   Milton Ferguson, MD    Physical Exam: Vitals:   12/24/18 1318 12/24/18 1430 12/24/18 1445 12/24/18 1500  BP: 132/70 102/67 106/67 117/65  Pulse: (!) 108 90 86 83  Resp: 17 (!) 22 13 16   Temp: (!) 103.1 F (39.5 C)     TempSrc: Oral     SpO2: 94% 97% 96% 97%    Constitutional: Weak,  chronically ill looking Vitals:   12/24/18 1318 12/24/18 1430 12/24/18 1445 12/24/18 1500  BP: 132/70 102/67 106/67 117/65  Pulse: (!) 108 90 86 83  Resp: 17 (!) 22 13 16   Temp: (!) 103.1 F (39.5 C)     TempSrc: Oral     SpO2: 94% 97% 96% 97%   Eyes: PERRL, lids and conjunctivae normal ENMT: Mucous membranes are dry Neck: normal, supple, no masses, no thyromegaly Respiratory: clear to auscultation bilaterally, no wheezing, no crackles. Normal respiratory effort. No accessory muscle use.  Cardiovascular: Regular rate and rhythm, no murmurs / rubs / gallops. No extremity edema. 2+ pedal pulses. No carotid bruits.  Port-A-Cath on the right chest Abdomen: no tenderness, no masses palpated. No hepatosplenomegaly. Bowel sounds positive.  Musculoskeletal: no clubbing / cyanosis. No joint deformity upper and lower extremities. Good ROM, no contractures. Normal muscle tone.  Skin: Scattered purpura, ecchymosis, no ulcers Neurologic: CN 2-12 grossly intact. Sensation intact, DTR normal. Strength 5/5 in all 4.  Psychiatric: Normal judgment and insight. Alert and oriented x 3. Normal mood.   Foley Catheter:None  Labs on Admission: I have personally reviewed following labs and imaging studies  CBC: Recent Labs  Lab 12/24/18 1123  WBC 9.3  NEUTROABS 8.2*  HGB 12.0*  HCT 37.0*  MCV 87.5  PLT 481   Basic Metabolic Panel: Recent Labs  Lab 12/24/18 1205  NA 135  K 4.8  CL 100  CO2 20*  GLUCOSE 132*  BUN 6*  CREATININE 0.94  CALCIUM 8.9   GFR: Estimated Creatinine Clearance: 103.4 mL/min (by C-G formula based on SCr of 0.94 mg/dL). Liver Function Tests: Recent Labs  Lab 12/24/18 1205  AST 40  ALT 73*  ALKPHOS 754*  BILITOT 0.8  PROT 6.3*  ALBUMIN 3.0*   Recent Labs  Lab 12/24/18 1328  LIPASE 24   No results for input(s): AMMONIA in the last 168 hours. Coagulation Profile: No results for input(s): INR, PROTIME in the last 168 hours. Cardiac Enzymes: No results for  input(s): CKTOTAL, CKMB, CKMBINDEX, TROPONINI in the last 168 hours. BNP (last 3 results) No results for input(s): PROBNP in the last 8760 hours. HbA1C: No results for input(s): HGBA1C in the last 72 hours. CBG: No results for input(s): GLUCAP in the last 168 hours. Lipid Profile: No results for input(s): CHOL, HDL, LDLCALC, TRIG, CHOLHDL, LDLDIRECT in the last 72 hours. Thyroid Function Tests: No results for input(s): TSH, T4TOTAL, FREET4, T3FREE, THYROIDAB in the last 72 hours. Anemia Panel: No results for input(s): VITAMINB12, FOLATE, FERRITIN, TIBC, IRON, RETICCTPCT in the  last 72 hours. Urine analysis:    Component Value Date/Time   COLORURINE YELLOW 12/24/2018 1328   APPEARANCEUR CLEAR 12/24/2018 1328   LABSPEC 1.016 12/24/2018 1328   PHURINE 5.0 12/24/2018 1328   GLUCOSEU 50 (A) 12/24/2018 1328   HGBUR MODERATE (A) 12/24/2018 1328   BILIRUBINUR NEGATIVE 12/24/2018 1328   KETONESUR NEGATIVE 12/24/2018 1328   PROTEINUR 30 (A) 12/24/2018 1328   UROBILINOGEN 0.2 02/10/2007 1109   NITRITE NEGATIVE 12/24/2018 1328   LEUKOCYTESUR NEGATIVE 12/24/2018 1328    Radiological Exams on Admission: Ct Abdomen Pelvis W Contrast  Result Date: 12/24/2018 CLINICAL DATA:  Abdominal distension, fever, history of pancreatic cancer EXAM: CT ABDOMEN AND PELVIS WITH CONTRAST TECHNIQUE: Multidetector CT imaging of the abdomen and pelvis was performed using the standard protocol following bolus administration of intravenous contrast. CONTRAST:  172mL OMNIPAQUE IOHEXOL 300 MG/ML  SOLN COMPARISON:  MR abdomen pelvis, 12/04/2018, 10/30/2018 FINDINGS: Lower chest: No acute abnormality. Hepatobiliary: Known liver metastases are poorly appreciated by single-phase CT, for example in the dome of the liver (series 2, image 14). No gallstones or gallbladder wall thickening. Common bile duct stent is positioned tip in the descending portion of the duodenum with post stenting pneumobilia. Pancreas: Discrete  pancreatic head mass is not well appreciated, primarily appreciated by mass effect on the confluence of the portal vein (series 2, image 38. There is atrophy of the pancreatic body and tail and dilatation pancreatic duct. Spleen: Splenomegaly. Adrenals/Urinary Tract: Adrenal glands are unremarkable. Nonobstructive bilateral nephrolithiasis. Bladder is unremarkable. Stomach/Bowel: Stomach is within normal limits. Appendix is not clearly visualized. No evidence of bowel wall thickening, distention, or inflammatory changes. Large burden of stool in the distal colon and rectum. Vascular/Lymphatic: Aortic atherosclerosis. No enlarged abdominal or pelvic lymph nodes. Reproductive: No mass or other significant abnormality. Other: No abdominal wall hernia or abnormality. Moderate volume ascites. Musculoskeletal: No acute or significant osseous findings. IMPRESSION: 1. Redemonstrated pancreatic malignancy with hepatic metastases, status post common bile duct stenting. Primary mass and hepatic metastases are better appreciated by recent multiphasic MRI. 2.  Moderate volume ascites, which is similar to prior MRI. 3.  Splenomegaly. 4.  Large burden of stool in the distal colon and rectum. 5.  Nonobstructive bilateral nephrolithiasis. Electronically Signed   By: Eddie Candle M.D.   On: 12/24/2018 14:35   Dg Chest Portable 1 View  Result Date: 12/24/2018 CLINICAL DATA:  Fever and weakness. EXAM: PORTABLE CHEST 1 VIEW COMPARISON:  11/04/2018 FINDINGS: The cardiac silhouette, mediastinal and hilar contours are stable. Mild cardiac enlargement and mild tortuosity of the thoracic aorta. The right IJ power port is stable. The lungs are clear. No pleural effusions. The bony thorax is intact. IMPRESSION: No acute cardiopulmonary findings. Electronically Signed   By: Marijo Sanes M.D.   On: 12/24/2018 13:59     Assessment/Plan Principal Problem:   Sepsis ([employer]) Active Problems:   Essential hypertension   Primary cancer of  head of pancreas ([place])   Port-A-Cath in place    Suspected sepsis: No clear source of infection.  CXR does not show  pneumonia.  No abdominal abscess as per the CT scan.  UA not impressive.  He is immunocompromised, on chemotherapy.  Rule out bacteremia.  Follow-up blood cultures.  Continue broad-spectrum antibiotics.  Hypertension: On antihypertensives at home.  Currently his blood pressure is soft.  Continue to monitor blood pressure.  Metastatic pancreatic cancer: Cancer of head of pancreas with hepatic metastasis.  Following with oncology, Dr. Learta Codding.  On FOLFIRINOX.  Generalized weakness: We will request for PT evaluation  Constipation: CT abdomen showed possible constipation.  Patient states he had a bowel movement today.  Continue bowel regimen.    Severity of Illness: The appropriate patient status for this patient is INPATIENT.  Has sepsis, needs to be monitored very closely.  Needs broad-spectrum IV antibiotics  DVT prophylaxis: Lovenox Code Status: Full Family Communication: None present at the bedside Consults called: None     Shelly Coss MD [facility] Pager [phone]  If 7PM-7AM, please contact night-coverage [email or web address] Password [facility]  12/24/2018, 3:14 PM

## 2018-12-24 NOTE — Progress Notes (Signed)
Pharmacy Antibiotic Note  Cory Kelly is a 63 y.o. male with pancreas cancer on chemotherapy PTA (last treatment on 12/10/18) presented to the ED from [facility] on 12/24/2018 for workup of fever and AMS. To start cefepime and vancomycin for suspected sepsis.  Today, 12/24/2018: - scr 0.94 (crcl~100) - ANC 8.2  Plan: - cefepime 2gm IV q8h - vancomycin 1000 mg IV x1 given in the ED at 1440, then 1250 mg IV q12h for est AUC 440  ___________________________  Temp (24hrs), Avg:100.9 F (38.3 C), Min:98.6 F (37 C), Max:103.1 F (39.5 C)  Recent Labs  Lab 12/24/18 1123 12/24/18 1205 12/24/18 1328  WBC 9.3  --   --   CREATININE  --  0.94  --   LATICACIDVEN  --   --  1.7    Estimated Creatinine Clearance: 103.4 mL/min (by C-G formula based on SCr of 0.94 mg/dL).    No Known Allergies   Thank you for allowing pharmacy to be a part of this patient's care.  Lynelle Doctor 12/24/2018 3:31 PM

## 2018-12-24 NOTE — Progress Notes (Addendum)
[place]   [phone] [phone]   Clinic Follow up Note   Patient Care Team: Mittie Bodo as PCP - General (Physician Assistant) 12/24/2018  CHIEF COMPLAINT: fever, chills   INTERVAL HISTORY: [name] presents for symptom management visit. He was last seen 12/10/18 and completed cycle 7 FOLFIRINOX. He was seen in ED 12/17/18 for weakness, chills with low grade fever of 100, and nausea. Patient wanted to go home; ED physician spoke to Dr. Benay Spice who recommended cipro and zofran. He felt better later last week. This morning his wife called triage line to report fever spikes to 100.4 - 100.6 axillary in the evenings and feeling terrible. He was brought in for lab and evaluation. Patient is shaking on exam table. He is awake. He denies pain. Not able to answer most questions or follow commands.    MEDICAL HISTORY:  Past Medical History:  Diagnosis Date   Abdominal pain    due to bloating   Bloating    Cancer (HCC)    skin cancer   Essential hypertension    Fatigue    History of kidney stones    passed   History of weight loss    HOH (hard of hearing)    no hearing aids   Neuralgia    Pancreatic cancer [facility])    Stress    loss of father in Dec 2018.    SURGICAL HISTORY: Past Surgical History:  Procedure Laterality Date   APPENDECTOMY     BILIARY STENT PLACEMENT N/A 06/25/2018   Procedure: BILIARY STENT PLACEMENT;  Surgeon: Rush Landmark Telford Nab., MD;  Location: [facility] ENDOSCOPY;  Service: Gastroenterology;  Laterality: N/A;   BILIARY STENT PLACEMENT  07/20/2018   Procedure: BILIARY STENT PLACEMENT;  Surgeon: Milus Banister, MD;  Location: [facility] ENDOSCOPY;  Service: Gastroenterology;;   BILIARY STENT PLACEMENT N/A 08/16/2018   Procedure: BILIARY STENT PLACEMENT;  Surgeon: Milus Banister, MD;  Location: [facility] ENDOSCOPY;  Service: Endoscopy;  Laterality: N/A;   ENDOSCOPIC RETROGRADE CHOLANGIOPANCREATOGRAPHY (ERCP) WITH  PROPOFOL N/A 08/10/2017   Procedure: ENDOSCOPIC RETROGRADE CHOLANGIOPANCREATOGRAPHY (ERCP) WITH PROPOFOL;  Surgeon: Milus Banister, MD;  Location: [facility] ENDOSCOPY;  Service: Endoscopy;  Laterality: N/A;   ENDOSCOPIC RETROGRADE CHOLANGIOPANCREATOGRAPHY (ERCP) WITH PROPOFOL N/A 07/20/2018   Procedure: ENDOSCOPIC RETROGRADE CHOLANGIOPANCREATOGRAPHY (ERCP) WITH PROPOFOL;  Surgeon: Milus Banister, MD;  Location: [facility] ENDOSCOPY;  Service: Gastroenterology;  Laterality: N/A;  Balloon Sweeps   ENDOSCOPIC RETROGRADE CHOLANGIOPANCREATOGRAPHY (ERCP) WITH PROPOFOL N/A 08/16/2018   Procedure: ENDOSCOPIC RETROGRADE CHOLANGIOPANCREATOGRAPHY (ERCP) WITH PROPOFOL;  Surgeon: Milus Banister, MD;  Location: [facility] ENDOSCOPY;  Service: Endoscopy;  Laterality: N/A;   ENDOSCOPIC RETROGRADE CHOLANGIOPANCREATOGRAPHY (ERCP) WITH PROPOFOL N/A 12/06/2018   Procedure: ENDOSCOPIC RETROGRADE CHOLANGIOPANCREATOGRAPHY (ERCP) WITH PROPOFOL;  Surgeon: Milus Banister, MD;  Location: [facility] ENDOSCOPY;  Service: Endoscopy;  Laterality: N/A;  cbd stent sweep   ERCP N/A 06/25/2018   Procedure: ENDOSCOPIC RETROGRADE CHOLANGIOPANCREATOGRAPHY (ERCP);  Surgeon: Irving Copas., MD;  Location: [facility];  Service: Gastroenterology;  Laterality: N/A;   EUS N/A 08/10/2017   Procedure: UPPER ENDOSCOPIC ULTRASOUND (EUS) RADIAL;  Surgeon: Milus Banister, MD;  Location: [facility] ENDOSCOPY;  Service: Endoscopy;  Laterality: N/A;   EYE SURGERY     lasik/left eye   IR FLUORO GUIDE PORT INSERTION RIGHT  08/30/2017   IR US GUIDE VASC ACCESS RIGHT  08/30/2017   REMOVAL OF STONES  06/25/2018   Procedure: REMOVAL OF STONES;  Surgeon: Irving Copas., MD;  Location:  [facility] ENDOSCOPY;  Service: Gastroenterology;;   SKIN CANCER EXCISION     STENT REMOVAL  07/20/2018   Procedure: STENT REMOVAL;  Surgeon: Milus Banister, MD;  Location: [facility] ENDOSCOPY;  Service: Gastroenterology;;   Lavell Islam REMOVAL  08/16/2018   Procedure: STENT REMOVAL;  Surgeon:  Milus Banister, MD;  Location: [facility] ENDOSCOPY;  Service: Endoscopy;;   TEE WITHOUT CARDIOVERSION N/A 11/08/2018   Procedure: TRANSESOPHAGEAL ECHOCARDIOGRAM (TEE);  Surgeon: Acie Fredrickson Wonda Cheng, MD;  Location: [facility] ENDOSCOPY;  Service: Cardiovascular;  Laterality: N/A;    I have reviewed the social history and family history with the patient and they are unchanged from previous note.  ALLERGIES:  has No Known Allergies.  MEDICATIONS:  No current facility-administered medications for this visit.    Current Outpatient Medications  Medication Sig Dispense Refill   amLODipine (NORVASC) 10 MG tablet Take 10 mg by mouth at bedtime.      ciprofloxacin (CIPRO) 500 MG tablet Take 1 tablet (500 mg total) by mouth 2 (two) times daily. 14 tablet 0   doxazosin (CARDURA) 1 MG tablet Take 1 mg by mouth at bedtime.     gabapentin (NEURONTIN) 100 MG capsule Take 2 capsules (200 mg total) by mouth 2 (two) times daily. 120 capsule 1   lidocaine-prilocaine (EMLA) cream Apply 1 application topically as needed. Apply to portacath site 1-2 hours prior to use (Patient taking differently: Apply 1 application topically daily as needed (1-2 hours prior to portacath access). ) 30 g 2   loperamide (IMODIUM A-D) 2 MG tablet Take 1 tablet (2 mg total) by mouth as directed. (Patient taking differently: Take 2-4 mg by mouth 3 (three) times daily as needed for diarrhea or loose stools. ) 30 tablet 0   olmesartan (BENICAR) 40 MG tablet Take 1 tablet (40 mg total) by mouth daily. 90 tablet 0   ondansetron (ZOFRAN ODT) 4 MG disintegrating tablet '4mg'$  ODT q4 hours prn nausea/vomit (Patient not taking: Reported on 12/24/2018) 12 tablet 0   ondansetron (ZOFRAN) 4 MG tablet Take 1 tablet (4 mg total) by mouth every 6 (six) hours as needed for nausea. 20 tablet 0   oxyCODONE-acetaminophen (PERCOCET) 10-325 MG tablet Take 1 tablet by mouth every 4 (four) hours as needed for pain. 75 tablet 0   potassium chloride SA (K-DUR,KLOR-CON)  20 MEQ tablet Take 1 tablet (20 mEq total) by mouth 2 (two) times daily. 60 tablet 1   PRAMOXINE-ZINC ACETATE EX Apply 1 application topically daily as needed (itching).     prochlorperazine (COMPAZINE) 10 MG tablet Take 10 mg by mouth every 6 (six) hours as needed for nausea or vomiting.     Facility-Administered Medications Ordered in Other Visits  Medication Dose Route Frequency Provider Last Rate Last Dose   0.9 %  sodium chloride infusion   Intravenous Continuous Adhikari, Amrit, MD       acetaminophen (TYLENOL) tablet 650 mg  650 mg Oral Q6H PRN Adhikari, Amrit, MD       ceFEPIme (MAXIPIME) 2 g in sodium chloride 0.9 % 100 mL IVPB  2 g Intravenous Q8H Pham, Anh P, RPH       enoxaparin (LOVENOX) injection 40 mg  40 mg Subcutaneous Q24H Adhikari, Amrit, MD       oxyCODONE-acetaminophen (PERCOCET/ROXICET) 5-325 MG per tablet 1 tablet  1 tablet Oral Q6H PRN Shelly Coss, MD       And   oxyCODONE (Oxy IR/ROXICODONE) immediate release tablet 5 mg  5 mg Oral Q6H PRN Adhikari,  Amrit, MD       sodium chloride (PF) 0.9 % injection            vancomycin (VANCOCIN) 1,250 mg in sodium chloride 0.9 % 250 mL IVPB  1,250 mg Intravenous Q12H Pham, Anh P, RPH        PHYSICAL EXAMINATION:   Vitals:   12/24/18 1150  BP: 113/69  Pulse: 81  Resp: 18  Temp: 98.6 F (37 C)  SpO2: 99%   Filed Weights   12/24/18 1150  Weight: 195 lb 4.8 oz (88.6 kg)    GENERAL: awake, no acute distress  SKIN: dry skin with tenting to forearms. Ecchymosis to hands  ORAL: moist mucus membranes  EYES: sclera clear. EOM intact  LUNGS Clear anteriorly. Respirations unlabored  HEART: tachycardic to 120's; no lower extremity edema ABDOMEN: abdomen soft, non-tender and normal bowel sounds NEURO:  Awake, not oriented; follows some commands. Generalized weakness, non-weight bearing  PAC without erythema   LABORATORY DATA:  I have reviewed the data as listed CBC Latest Ref Rng & Units 12/24/2018 12/10/2018  12/03/2018  WBC 4.0 - 10.5 K/uL 9.3 6.2 3.8(L)  Hemoglobin 13.0 - 17.0 g/dL 12.0(L) 11.5(L) 10.8(L)  Hematocrit 39.0 - 52.0 % 37.0(L) 35.2(L) 33.6(L)  Platelets 150 - 400 K/uL 169 116(L) 95(L)     CMP Latest Ref Rng & Units 12/24/2018 12/10/2018 12/03/2018  Glucose 70 - 99 mg/dL 132(H) 184(H) 206(H)  BUN 8 - 23 mg/dL 6(L) 6(L) <4(L)  Creatinine 0.61 - 1.24 mg/dL 0.94 0.76 0.76  Sodium 135 - 145 mmol/L 135 134(L) 135  Potassium 3.5 - 5.1 mmol/L 4.8 3.8 3.6  Chloride 98 - 111 mmol/L 100 100 100  CO2 22 - 32 mmol/L 20(L) 25 26  Calcium 8.9 - 10.3 mg/dL 8.9 8.1(L) 8.2(L)  Total Protein 6.5 - 8.1 g/dL 6.3(L) 5.7(L) 5.7(L)  Total Bilirubin 0.3 - 1.2 mg/dL 0.8 0.9 0.9  Alkaline Phos 38 - 126 U/L 754(H) 818(H) 1,261(H)  AST 15 - 41 U/L 40 47(H) 276(HH)  ALT 0 - 44 U/L 73(H) 83(H) 367(HH)      RADIOGRAPHIC STUDIES: I have personally reviewed the radiological images as listed and agreed with the findings in the report. Ct Abdomen Pelvis W Contrast  Result Date: 12/24/2018 CLINICAL DATA:  Abdominal distension, fever, history of pancreatic cancer EXAM: CT ABDOMEN AND PELVIS WITH CONTRAST TECHNIQUE: Multidetector CT imaging of the abdomen and pelvis was performed using the standard protocol following bolus administration of intravenous contrast. CONTRAST:  151m OMNIPAQUE IOHEXOL 300 MG/ML  SOLN COMPARISON:  MR abdomen pelvis, 12/04/2018, 10/30/2018 FINDINGS: Lower chest: No acute abnormality. Hepatobiliary: Known liver metastases are poorly appreciated by single-phase CT, for example in the dome of the liver (series 2, image 14). No gallstones or gallbladder wall thickening. Common bile duct stent is positioned tip in the descending portion of the duodenum with post stenting pneumobilia. Pancreas: Discrete pancreatic head mass is not well appreciated, primarily appreciated by mass effect on the confluence of the portal vein (series 2, image 38. There is atrophy of the pancreatic body and tail and  dilatation pancreatic duct. Spleen: Splenomegaly. Adrenals/Urinary Tract: Adrenal glands are unremarkable. Nonobstructive bilateral nephrolithiasis. Bladder is unremarkable. Stomach/Bowel: Stomach is within normal limits. Appendix is not clearly visualized. No evidence of bowel wall thickening, distention, or inflammatory changes. Large burden of stool in the distal colon and rectum. Vascular/Lymphatic: Aortic atherosclerosis. No enlarged abdominal or pelvic lymph nodes. Reproductive: No mass or other significant abnormality. Other: No abdominal wall  hernia or abnormality. Moderate volume ascites. Musculoskeletal: No acute or significant osseous findings. IMPRESSION: 1. Redemonstrated pancreatic malignancy with hepatic metastases, status post common bile duct stenting. Primary mass and hepatic metastases are better appreciated by recent multiphasic MRI. 2.  Moderate volume ascites, which is similar to prior MRI. 3.  Splenomegaly. 4.  Large burden of stool in the distal colon and rectum. 5.  Nonobstructive bilateral nephrolithiasis. Electronically Signed   By: Eddie Candle M.D.   On: 12/24/2018 14:35   Dg Chest Portable 1 View  Result Date: 12/24/2018 CLINICAL DATA:  Fever and weakness. EXAM: PORTABLE CHEST 1 VIEW COMPARISON:  11/04/2018 FINDINGS: The cardiac silhouette, mediastinal and hilar contours are stable. Mild cardiac enlargement and mild tortuosity of the thoracic aorta. The right IJ power port is stable. The lungs are clear. No pleural effusions. The bony thorax is intact. IMPRESSION: No acute cardiopulmonary findings. Electronically Signed   By: Marijo Sanes M.D.   On: 12/24/2018 13:59     ASSESSMENT & PLAN:  1. Pancreas head mass  CT abdomen/pelvis 08/03/2017-fullness in the head of the pancreas with stranding in the peripancreatic fat and pancreatic ductal dilatation.   MRI abdomen 08/04/2017-findings favored to represent acute on chronic pancreatitis; equivocal soft tissue fullness within  the head and uncinate process of the pancreas; indeterminate too small to characterize right hepatic lobe lesion; small volume abdominal ascites; splenomegaly.   CA-19-9 elevated at 977on 08/04/2017.  Status postupper EUS 08/10/2017-findings of an irregular masslike region in the pancreatic head measuring approximately 2.7 cm. This directly abutted the main portal vein but no other major vascular structures. Biopsies obtained with preliminary cytology positive for malignancy, likely adenocarcinoma. The final report is pending. The common bile duct and main pancreatic duct were both dilated. ERCP was then proceeded with forstent placement. Multiple attempts were made tocannulate the bile duct without success. The procedure was aborted.  08/16/2017 ERCP with placement of a metal biliary stent in the common bile duct by Dr. Francella Solian at [facility].  Cycle 1 FOLFIRINOX 08/31/2017  Cycle 2 FOLFIRINOX 09/14/2017  Cycle 3 FOLFIRINOX 09/28/2017  Cycle 4 FOLFIRINOX 10/12/2017 (oxaliplatin dose reduced secondary to thrombocytopenia)  Cycle 5 FOLFIRINOX 10/26/2017  Cycle 6 FOLFIRINOX 11/09/2017  Rest[facility] CTs at [facility] 11/29/2017-no definitive evidence of distant metastatic disease. 2 subcentimeter liver lesions described on prior MRI not visualized on CT. Ill-defined pancreatic head mass stable to decreased in size measuring 2.1 x 2 cm. Peripancreatic inflammatory stranding decreased from prior. No biliary ductal dilatation. Celiac axis less than 180 degrees abutment. Common hepatic artery with greater than 180 degrees encasement; superior mesenteric artery with short segment less than 180 degrees abutment; portal vein/superior mesenteric vein with greater than 180 degrees encasement of the extrahepatic portal vein with associated circumferential narrowing at the portomesenteric, overall substantially improved from prior examination. Now less than 180 degree abutment of the SMV. The portal vein and SMV  remain patent. Splenic vein patent.  Cycle 1 gemcitabine/Abraxane 12/06/2017  Cycle 2 gemcitabine/Abraxane 12/20/2017  Cycle 3 gemcitabine/Abraxane 01/03/2018  Cycle 4 gemcitabine/Abraxane 01/17/2018  Cycle 5 gemcitabine 01/31/2018 (Abraxane held due to neuropathy)  Cycle 6 gemcitabine 02/14/2018 (Abraxane held due to neuropathy)  Cycle 7 gemcitabine 02/28/2018 (Abraxane held due to neuropathy)  CT chest/abdomen/pelvis at [facility] 03/08/2018- stable appearance of previously identified infiltrating pancreatic head mass. No CT evidence of metastatic disease.  SBRT to the pancreas 04/05/2018-04/11/2018  05/09/2018 CA-19-9 2138   MRI abdomen 05/09/2018- similar 6 mm hypoenhancing focus posterior right lobe liver; more superior  lesion near the dome of the liver not identified on the postcontrast imaging but there is a persistent focus of diffusion signal abnormality in this region; new 8 mm hypoenhancing focus located just superior to the gallbladder; additional tiny focus of diffusion abnormality located more superiorly without obvious associated abnormal enhancement. Ill-defined pancreatic head mass not felt to be significantly changed.  CT chest/abdomen/pelvis 05/10/2018- ill-defined hypoattenuating pancreatic head mass and ill-defined soft tissue abutting the celiac axis with mildly increased dilatation of the main pancreatic duct. New soft tissue nodule along the right upper lobe bronchus; hypoattenuating liver lesions concerning for metastasis.  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 The stent traverses the pancreatic mass and extends into the  duodenum. Increase in size of 2 liver lesions. Pancreatic mass appears similar. Indistinct tissue planes around the duodenum along with wall thickening in the gallbladder and especially the gallbladder neck.  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  Diffuse hepatic metastasis.  Previous index lesions unchanged in size.  Single new metastasis identified within the medial segment left lobe of liver.  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  Abdominal ascites increased in the interval.  Cycle 7  FOLFIRINOX6/02/2019 2. Hypertension 3. Port-A-Cath placement 08/30/2017 4. History of elevated bilirubin-questionGilbert's syndrome 5. Mild leukopenia, thrombocytopenia-question secondary to portal hypertension/splenomegaly;09/14/2017 white count improved, platelet count stable;progressive thrombocytopenia following gemcitabine/Abraxane 6. Kidney stones 7. Fever following cycles 2 and 3 gemcitabine/Abraxane,? Fever related to gemcitabine 8.Jaundice, elevated liver enzymes and bilirubin 06/22/2018; status post ERCP 06/25/2018 with debris removed from the existing stent and a new plastic stent placed. Recurrent jaundice 07/19/2018 status post ERCP 07/20/2018 with finding that the previously placed plastic stent had migrated distally. There was debris and sludge on the stent. The previously placed stent was removed. 5 to 8 mm stricture just proximal to the existing SEMS. 2 plastic stents were placed.ERCP 08/16/2018- 2 plastic biliary stents were removed from within the previously placed partially covered SEMS; stricture confirmed just proximal to the previously placed SEMS, treated with  placement of an 8 cm long 10 mm diameter uncovered SEMS in good position. 9.Hospitalized with bacteremia Streptococcus group C 11/04/2018 through 11/08/2018. IV ceftriaxone through 11/17/2018. TEE 11/08/2018 negative. 10.  Elevated LFTs 12/03/2018- MRI abdomen 12/04/2018 with mild increase in size of mass involving head of the pancreas.  Diffuse hepatic metastasis.  Previous index lesions unchanged in size.  Single new metastasis identified within the medial segment left lobe of liver.  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  Abdominal ascites increased in the interval.  12/06/2018 ERCP-previously placed uncovered 8 cm long metal biliary stent nearly filled with bio debris and flecks of tissue ingrowth.  Stent cleared. 11. 12/24/18 Symptom management visit for fever, chills. Tmax 100.6 ax at  home. Patient with acute mental status change on exam. Sent to ED for work up and likely hospital admission, suspect biliary sepsis.   Disposition:  Mr. Bord is ill appearing and recently febrile in the setting of ongoing immunosuppressive chemotherapy and recent bacteremia s/p biliary stent exchange. Given his acute mental status change and physical exam concerning for sepsis, r/o cholangitis, he was transported to [facility] ED for IV antibiotics, work up, and likely hospital admission. Lab work including blood cultures were drawn in clinic. NS bolus infusing. The patient was seen with Dr. Benay Spice. I spoke to ED physician and charge nurse prior to arrival. I updated his wife.     Orders Placed This Encounter  Procedures   Culture, Blood    Standing Status:   Future    Number of Occurrences:   1    Standing Expiration Date:   12/24/2019    Order Specific Question:   Source    Answer:   [facility]   All questions were answered. The patient knows to call the clinic with any problems, questions or concerns. No barriers to learning was detected.     Alla Feeling, NP 12/24/18   This was a shared visit with Silvio Clayman.  Mr. Nudelman was interviewed and examined.  He presents with altered mental status, fever, and chills.  We have a high clinical suspicion for bacterial sepsis.  He will be referred to the emergency room for acute management and hospital admission.  I suspect he has recurrent biliary sepsis.  Julieanne Manson, MD

## 2018-12-24 NOTE — Telephone Encounter (Signed)
Wife reports his axillary temps are running 100.4 to 100.6. Feeling terrible today. Come now per Dr. Benay Spice. Will be seen w/labs.

## 2018-12-24 NOTE — Telephone Encounter (Addendum)
"  Cory Kelly's wife [name] [phone]).   Should he be on a different antibiotic?  Does he need to be seen?    Not sure why he's on Cipro.  Today is day six of Cipro started with ER visit last week when we called 911.  He says no tests were done in ED.    Keeps spiking fever in the evenings and feels bad.   This morning he feels like he's freezing.  Could not check his temperature yet, he's in the shower.  He gets in the shower when he feels bad or feels cold to warm up and prevent chills/shakes.  He does not have any symptoms of cough, trouble breathing, n/v, diarrhea or other changes just spikes fever and chills."

## 2018-12-24 NOTE — ED Notes (Addendum)
Patient arrived from [facility]; reported provided by [name], RN.   Hx of pancreatic cancer, and patient's wife stated that he has been having intermittent home fevers up to 100.53F axillary. Patient presents today, is altered and incontinent of stool. Patient changed into hospital gown, cleaned and brief applied.

## 2018-12-24 NOTE — Progress Notes (Signed)
RN got a report from ED at 1537. Patient was transferred to [place] at 1615. Alert and oriented x4. No pain complained. Room is set up. Call light is within patient's reach. Vital signs was taken. Waiting for medication verified from pharmacy.

## 2018-12-24 NOTE — ED Notes (Signed)
ED TO INPATIENT HANDOFF REPORT  ED Nurse Name and Phone #: [phone]  S Name/Age/Gender Cory Kelly 63 y.o. male Room/Bed: RESA/RESA  Code Status   Code Status: Full Code  Home/SNF/Other Home Patient oriented to: self, place and situation Is this baseline? Yes   Triage Complete: Triage complete  Chief Complaint cancer pt / fever   Triage Note No notes on file   Allergies No Known Allergies  Level of Care/Admitting Diagnosis ED Disposition    ED Disposition Condition [facility]: [place] [[id number]]  Level of Care: Med-Surg [16]  Covid Evaluation: Screening Protocol (No Symptoms)  Diagnosis: Sepsis [facility]) [[id number]]  Admitting Physician: Shelly Coss [[id number]]  Attending Physician: Shelly Coss [[id number]]  Estimated length of stay: past midnight tomorrow  Certification:: I certify this patient will need inpatient services for at least 2 midnights  PT Class (Do Not Modify): Inpatient [101]  PT Acc Code (Do Not Modify): Private [1]       B Medical/Surgery History Past Medical History:  Diagnosis Date  . Abdominal pain    due to bloating  . Bloating   . Cancer ([place])    skin cancer  . Essential hypertension   . Fatigue   . History of kidney stones    passed  . History of weight loss   . HOH (hard of hearing)    no hearing aids  . Neuralgia   . Pancreatic cancer ([place])   . Stress    loss of father in Dec 2018.   Past Surgical History:  Procedure Laterality Date  . APPENDECTOMY    . BILIARY STENT PLACEMENT N/A 06/25/2018   Procedure: BILIARY STENT PLACEMENT;  Surgeon: Rush Landmark Telford Nab., MD;  Location: [facility];  Service: Gastroenterology;  Laterality: N/A;  . BILIARY STENT PLACEMENT  07/20/2018   Procedure: BILIARY STENT PLACEMENT;  Surgeon: Milus Banister, MD;  Location: [facility] ENDOSCOPY;  Service: Gastroenterology;;  . BILIARY STENT PLACEMENT N/A 08/16/2018   Procedure: BILIARY STENT PLACEMENT;   Surgeon: Milus Banister, MD;  Location: [facility] ENDOSCOPY;  Service: Endoscopy;  Laterality: N/A;  . ENDOSCOPIC RETROGRADE CHOLANGIOPANCREATOGRAPHY (ERCP) WITH PROPOFOL N/A 08/10/2017   Procedure: ENDOSCOPIC RETROGRADE CHOLANGIOPANCREATOGRAPHY (ERCP) WITH PROPOFOL;  Surgeon: Milus Banister, MD;  Location: [facility] ENDOSCOPY;  Service: Endoscopy;  Laterality: N/A;  . ENDOSCOPIC RETROGRADE CHOLANGIOPANCREATOGRAPHY (ERCP) WITH PROPOFOL N/A 07/20/2018   Procedure: ENDOSCOPIC RETROGRADE CHOLANGIOPANCREATOGRAPHY (ERCP) WITH PROPOFOL;  Surgeon: Milus Banister, MD;  Location: [facility] ENDOSCOPY;  Service: Gastroenterology;  Laterality: N/A;  Balloon Sweeps  . ENDOSCOPIC RETROGRADE CHOLANGIOPANCREATOGRAPHY (ERCP) WITH PROPOFOL N/A 08/16/2018   Procedure: ENDOSCOPIC RETROGRADE CHOLANGIOPANCREATOGRAPHY (ERCP) WITH PROPOFOL;  Surgeon: Milus Banister, MD;  Location: [facility] ENDOSCOPY;  Service: Endoscopy;  Laterality: N/A;  . ENDOSCOPIC RETROGRADE CHOLANGIOPANCREATOGRAPHY (ERCP) WITH PROPOFOL N/A 12/06/2018   Procedure: ENDOSCOPIC RETROGRADE CHOLANGIOPANCREATOGRAPHY (ERCP) WITH PROPOFOL;  Surgeon: Milus Banister, MD;  Location: [facility] ENDOSCOPY;  Service: Endoscopy;  Laterality: N/A;  cbd stent sweep  . ERCP N/A 06/25/2018   Procedure: ENDOSCOPIC RETROGRADE CHOLANGIOPANCREATOGRAPHY (ERCP);  Surgeon: Irving Copas., MD;  Location: [facility];  Service: Gastroenterology;  Laterality: N/A;  . EUS N/A 08/10/2017   Procedure: UPPER ENDOSCOPIC ULTRASOUND (EUS) RADIAL;  Surgeon: Milus Banister, MD;  Location: [facility] ENDOSCOPY;  Service: Endoscopy;  Laterality: N/A;  . EYE SURGERY     lasik/left eye  . IR FLUORO GUIDE PORT INSERTION RIGHT  08/30/2017  . IR US GUIDE VASC ACCESS RIGHT  08/30/2017  .  REMOVAL OF STONES  06/25/2018   Procedure: REMOVAL OF STONES;  Surgeon: Rush Landmark Telford Nab., MD;  Location: [facility] ENDOSCOPY;  Service: Gastroenterology;;  . SKIN CANCER EXCISION    . STENT REMOVAL  07/20/2018   Procedure: STENT REMOVAL;   Surgeon: Milus Banister, MD;  Location: [facility] ENDOSCOPY;  Service: Gastroenterology;;  . Lavell Islam REMOVAL  08/16/2018   Procedure: STENT REMOVAL;  Surgeon: Milus Banister, MD;  Location: [facility] ENDOSCOPY;  Service: Endoscopy;;  . TEE WITHOUT CARDIOVERSION N/A 11/08/2018   Procedure: TRANSESOPHAGEAL ECHOCARDIOGRAM (TEE);  Surgeon: Acie Fredrickson Wonda Cheng, MD;  Location: [facility] ENDOSCOPY;  Service: Cardiovascular;  Laterality: N/A;     A IV Location/Drains/Wounds Patient Lines/Drains/Airways Status   Active Line/Drains/Airways    Name:   Placement date:   Placement time:   Site:   Days:   Implanted Port 08/30/17 Right Chest   08/30/17    -    Chest   481   GI Stent 10 Fr.   08/16/18    1200    -   130          Intake/Output Last 24 hours No intake or output data in the 24 hours ending 12/24/18 1531  Labs/Imaging Results for orders placed or performed during the hospital encounter of 12/24/18 (from the past 48 hour(s))  Lipase, blood     Status: None   Collection Time: 12/24/18  1:28 PM  Result Value Ref Range   Lipase 24 11 - 51 U/L    Comment: Performed at [facility], [place] [address], [place], [place] [address]  Urinalysis, Routine w reflex microscopic     Status: Abnormal   Collection Time: 12/24/18  1:28 PM  Result Value Ref Range   Color, Urine YELLOW YELLOW   APPearance CLEAR CLEAR   Specific Gravity, Urine 1.016 1.005 - 1.030   pH 5.0 5.0 - 8.0   Glucose, UA 50 (A) NEGATIVE mg/dL   Hgb urine dipstick MODERATE (A) NEGATIVE   Bilirubin Urine NEGATIVE NEGATIVE   Ketones, ur NEGATIVE NEGATIVE mg/dL   Protein, ur 30 (A) NEGATIVE mg/dL   Nitrite NEGATIVE NEGATIVE   Leukocytes,Ua NEGATIVE NEGATIVE   RBC / HPF 11-20 0 - 5 RBC/hpf   WBC, UA 0-5 0 - 5 WBC/hpf   Bacteria, UA NONE SEEN NONE SEEN   Squamous Epithelial / LPF 0-5 0 - 5   Mucus PRESENT    Hyaline Casts, UA PRESENT     Comment: Performed at [facility], [place] [address] [place], [place]  [address]  Lactic acid, plasma     Status: None   Collection Time: 12/24/18  1:28 PM  Result Value Ref Range   Lactic Acid, Venous 1.7 0.5 - 1.9 mmol/L    Comment: Performed at [facility], [place] [address], [place], [place] [address]  SARS Coronavirus 2 (CEPHEID - Performed in [place] hospital lab), Hosp Order     Status: None   Collection Time: 12/24/18  1:28 PM   Specimen: Nasopharyngeal Swab  Result Value Ref Range   SARS Coronavirus 2 NEGATIVE NEGATIVE    Comment: (NOTE) If result is NEGATIVE SARS-CoV-2 target nucleic acids are NOT DETECTED. The SARS-CoV-2 RNA is generally detectable in upper and lower  respiratory specimens during the acute phase of infection. The lowest  concentration of SARS-CoV-2 viral copies this assay can detect is 250  copies / mL. A negative result does not preclude SARS-CoV-2 infection  and should not be  used as the sole basis for treatment or other  patient management decisions.  A negative result may occur with  improper specimen collection / handling, submission of specimen other  than nasopharyngeal swab, presence of viral mutation(s) within the  areas targeted by this assay, and inadequate number of viral copies  (<250 copies / mL). A negative result must be combined with clinical  observations, patient history, and epidemiological information. If result is POSITIVE SARS-CoV-2 target nucleic acids are DETECTED. The SARS-CoV-2 RNA is generally detectable in upper and lower  respiratory specimens dur ing the acute phase of infection.  Positive  results are indicative of active infection with SARS-CoV-2.  Clinical  correlation with patient history and other diagnostic information is  necessary to determine patient infection status.  Positive results do  not rule out bacterial infection or co-infection with other viruses. If result is PRESUMPTIVE POSTIVE SARS-CoV-2 nucleic acids MAY BE PRESENT.   A presumptive positive result was  obtained on the submitted specimen  and confirmed on repeat testing.  While 2019 novel coronavirus  (SARS-CoV-2) nucleic acids may be present in the submitted sample  additional confirmatory testing may be necessary for epidemiological  and / or clinical management purposes  to differentiate between  SARS-CoV-2 and other Sarbecovirus currently known to infect humans.  If clinically indicated additional testing with an alternate test  methodology [phone]) is advised. The SARS-CoV-2 RNA is generally  detectable in upper and lower respiratory sp ecimens during the acute  phase of infection. The expected result is Negative. Fact Sheet for Patients:  StrictlyIdeas.no Fact Sheet for Healthcare Providers: BankingDealers.co.za This test is not yet approved or cleared by the [place] FDA and has been authorized for detection and/or diagnosis of SARS-CoV-2 by FDA under an Emergency Use Authorization (EUA).  This EUA will remain in effect (meaning this test can be used) for the duration of the COVID-19 declaration under Section 564(b)(1) of the Act, 21 U.S.C. section 360bbb-3(b)(1), unless the authorization is terminated or revoked sooner. Performed at [facility], [place] [address], [place], [place] [address]    Ct Abdomen Pelvis W Contrast  Result Date: 12/24/2018 CLINICAL DATA:  Abdominal distension, fever, history of pancreatic cancer EXAM: CT ABDOMEN AND PELVIS WITH CONTRAST TECHNIQUE: Multidetector CT imaging of the abdomen and pelvis was performed using the standard protocol following bolus administration of intravenous contrast. CONTRAST:  13mL OMNIPAQUE IOHEXOL 300 MG/ML  SOLN COMPARISON:  MR abdomen pelvis, 12/04/2018, 10/30/2018 FINDINGS: Lower chest: No acute abnormality. Hepatobiliary: Known liver metastases are poorly appreciated by single-phase CT, for example in the dome of the liver (series 2, image 14). No  gallstones or gallbladder wall thickening. Common bile duct stent is positioned tip in the descending portion of the duodenum with post stenting pneumobilia. Pancreas: Discrete pancreatic head mass is not well appreciated, primarily appreciated by mass effect on the confluence of the portal vein (series 2, image 38. There is atrophy of the pancreatic body and tail and dilatation pancreatic duct. Spleen: Splenomegaly. Adrenals/Urinary Tract: Adrenal glands are unremarkable. Nonobstructive bilateral nephrolithiasis. Bladder is unremarkable. Stomach/Bowel: Stomach is within normal limits. Appendix is not clearly visualized. No evidence of bowel wall thickening, distention, or inflammatory changes. Large burden of stool in the distal colon and rectum. Vascular/Lymphatic: Aortic atherosclerosis. No enlarged abdominal or pelvic lymph nodes. Reproductive: No mass or other significant abnormality. Other: No abdominal wall hernia or abnormality. Moderate volume ascites. Musculoskeletal: No acute or significant osseous findings. IMPRESSION: 1. Redemonstrated pancreatic malignancy with  hepatic metastases, status post common bile duct stenting. Primary mass and hepatic metastases are better appreciated by recent multiphasic MRI. 2.  Moderate volume ascites, which is similar to prior MRI. 3.  Splenomegaly. 4.  Large burden of stool in the distal colon and rectum. 5.  Nonobstructive bilateral nephrolithiasis. Electronically Signed   By: Eddie Candle M.D.   On: 12/24/2018 14:35   Dg Chest Portable 1 View  Result Date: 12/24/2018 CLINICAL DATA:  Fever and weakness. EXAM: PORTABLE CHEST 1 VIEW COMPARISON:  11/04/2018 FINDINGS: The cardiac silhouette, mediastinal and hilar contours are stable. Mild cardiac enlargement and mild tortuosity of the thoracic aorta. The right IJ power port is stable. The lungs are clear. No pleural effusions. The bony thorax is intact. IMPRESSION: No acute cardiopulmonary findings. Electronically  Signed   By: Marijo Sanes M.D.   On: 12/24/2018 13:59    Pending Labs Unresulted Labs (From admission, onward)    Start     Ordered   12/25/18 7209  Basic metabolic panel  Tomorrow morning,   R     12/24/18 1513   12/25/18 0500  CBC  Tomorrow morning,   R     12/24/18 1513   12/24/18 1510  Culture, blood (routine x 2)  BLOOD CULTURE X 2,   R (with STAT occurrences)     12/24/18 1510   12/24/18 1303  Urine culture  ONCE - STAT,   STAT     12/24/18 1302          Vitals/Pain Today's Vitals   12/24/18 1318 12/24/18 1430 12/24/18 1445 12/24/18 1500  BP: 132/70 102/67 106/67 117/65  Pulse: (!) 108 90 86 83  Resp: 17 (!) 22 13 16   Temp: (!) 103.1 F (39.5 C)     TempSrc: Oral     SpO2: 94% 97% 96% 97%    Isolation Precautions No active isolations  Medications Medications  vancomycin (VANCOCIN) IVPB 1000 mg/200 mL premix (1,000 mg Intravenous New Bag/Given 12/24/18 1440)  sodium chloride (PF) 0.9 % injection (has no administration in time range)  0.9 %  sodium chloride infusion (has no administration in time range)  acetaminophen (TYLENOL) tablet 650 mg (has no administration in time range)  enoxaparin (LOVENOX) injection 40 mg (has no administration in time range)  oxyCODONE-acetaminophen (PERCOCET/ROXICET) 5-325 MG per tablet 1 tablet (has no administration in time range)    And  oxyCODONE (Oxy IR/ROXICODONE) immediate release tablet 5 mg (has no administration in time range)  sodium chloride 0.9 % bolus 1,000 mL (1,000 mLs Intravenous New Bag/Given 12/24/18 1328)  acetaminophen (TYLENOL) tablet 650 mg (650 mg Oral Given 12/24/18 1339)  piperacillin-tazobactam (ZOSYN) IVPB 3.375 g (0 g Intravenous Stopped 12/24/18 1440)  iohexol (OMNIPAQUE) 300 MG/ML solution 100 mL (100 mLs Intravenous Contrast Given 12/24/18 1403)    Mobility non-ambulatory     Focused Assessments Neuro Assessment Handoff:  Swallow screen pass? Yes          Neuro Assessment: Exceptions to  WDL Neuro Checks:      Last Documented NIHSS Modified Score:   Has TPA been given? No If patient is a Neuro Trauma and patient is going to OR before floor call report to [place] nurse: [phone] or [phone]     R Recommendations: See Admitting Provider Note  Report given to:   Additional Notes:

## 2018-12-24 NOTE — ED Notes (Signed)
Bed: RESA Expected date:  Expected time:  Means of arrival:  Comments: [place]

## 2018-12-24 NOTE — Progress Notes (Signed)
@   1330 transported patient to ED via W/C. Patient remains very lethargic and difficult to arouse to answer questions. Unable to stand or bear his weight. Still having chills. As he was transferred to stretcher, had very large soft orange stool.

## 2018-12-25 ENCOUNTER — Inpatient Hospital Stay (HOSPITAL_COMMUNITY): Payer: BC Managed Care – PPO

## 2018-12-25 ENCOUNTER — Telehealth: Payer: Self-pay | Admitting: Nurse Practitioner

## 2018-12-25 DIAGNOSIS — I10 Essential (primary) hypertension: Secondary | ICD-10-CM

## 2018-12-25 LAB — DIFFERENTIAL
Basophils Absolute: 0 10*3/uL (ref 0.0–0.1)
Basophils Relative: 1 %
Eosinophils Absolute: 0 10*3/uL (ref 0.0–0.5)
Eosinophils Relative: 1 %
Lymphocytes Relative: 9 %
Lymphs Abs: 0.2 10*3/uL — ABNORMAL LOW (ref 0.7–4.0)
Monocytes Absolute: 0.4 10*3/uL (ref 0.1–1.0)
Monocytes Relative: 16 %
Neutro Abs: 1.6 10*3/uL — ABNORMAL LOW (ref 1.7–7.7)
Neutrophils Relative %: 72 %

## 2018-12-25 LAB — CBC
HCT: 28.4 % — ABNORMAL LOW (ref 39.0–52.0)
Hemoglobin: 9.2 g/dL — ABNORMAL LOW (ref 13.0–17.0)
MCH: 28.5 pg (ref 26.0–34.0)
MCHC: 32.4 g/dL (ref 30.0–36.0)
MCV: 87.9 fL (ref 80.0–100.0)
Platelets: 60 10*3/uL — ABNORMAL LOW (ref 150–400)
RBC: 3.23 MIL/uL — ABNORMAL LOW (ref 4.22–5.81)
RDW: 14.6 % (ref 11.5–15.5)
WBC: 2.3 10*3/uL — ABNORMAL LOW (ref 4.0–10.5)
nRBC: 0 % (ref 0.0–0.2)

## 2018-12-25 LAB — BLOOD CULTURE ID PANEL (REFLEXED)

## 2018-12-25 LAB — URINE CULTURE: Culture: NO GROWTH

## 2018-12-25 LAB — BASIC METABOLIC PANEL
Anion gap: 9 (ref 5–15)
BUN: 7 mg/dL — ABNORMAL LOW (ref 8–23)
CO2: 23 mmol/L (ref 22–32)
Calcium: 7.9 mg/dL — ABNORMAL LOW (ref 8.9–10.3)
Chloride: 103 mmol/L (ref 98–111)
Creatinine, Ser: 0.71 mg/dL (ref 0.61–1.24)
GFR calc Af Amer: >60 mL/min (ref >60)
GFR calc non Af Amer: >60 mL/min (ref >60)
Glucose, Bld: 104 mg/dL — ABNORMAL HIGH (ref 70–99)
Potassium: 3.6 mmol/L (ref 3.5–5.1)
Sodium: 135 mmol/L (ref 135–145)

## 2018-12-25 MED ORDER — SENNOSIDES-DOCUSATE SODIUM 8.6-50 MG PO TABS: 2.0000 | ORAL_TABLET | Freq: Every evening | ORAL | Status: DC | PRN

## 2018-12-25 MED ORDER — POLYVINYL ALCOHOL 1.4 % OP SOLN
1.0000 [drp] | OPHTHALMIC | Status: DC | PRN
  Filled 2018-12-25: qty 15

## 2018-12-25 MED ORDER — GUAIFENESIN-DM 100-10 MG/5ML PO SYRP: 5.0000 mL | ORAL_SOLUTION | ORAL | Status: DC | PRN

## 2018-12-25 MED ORDER — HYDROCORTISONE 1 % EX CREA
1.0000 "application " | TOPICAL_CREAM | Freq: Three times a day (TID) | CUTANEOUS | Status: DC | PRN
  Filled 2018-12-25: qty 28

## 2018-12-25 MED ORDER — POLYETHYLENE GLYCOL 3350 17 G PO PACK: 17.0000 g | PACK | Freq: Every day | ORAL | Status: DC | PRN

## 2018-12-25 MED ORDER — SALINE SPRAY 0.65 % NA SOLN
1.0000 | NASAL | Status: DC | PRN
  Filled 2018-12-25: qty 44

## 2018-12-25 MED ORDER — MUSCLE RUB 10-15 % EX CREA
1.0000 "application " | TOPICAL_CREAM | CUTANEOUS | Status: DC | PRN
  Filled 2018-12-25: qty 85

## 2018-12-25 MED ORDER — AMLODIPINE BESYLATE 10 MG PO TABS
10.0000 mg | ORAL_TABLET | Freq: Every day | ORAL | Status: DC
  Administered 2018-12-25 – 2018-12-26 (×2): 10 mg via ORAL
  Filled 2018-12-25 (×2): qty 1

## 2018-12-25 MED ORDER — IRBESARTAN 75 MG PO TABS
37.5000 mg | ORAL_TABLET | Freq: Every day | ORAL | Status: DC
  Administered 2018-12-25 – 2018-12-27 (×3): 37.5 mg via ORAL
  Filled 2018-12-25 (×3): qty 1

## 2018-12-25 MED ORDER — LORATADINE 10 MG PO TABS: 10.0000 mg | ORAL_TABLET | Freq: Every day | ORAL | Status: DC | PRN

## 2018-12-25 MED ORDER — ALUM & MAG HYDROXIDE-SIMETH 200-200-20 MG/5ML PO SUSP: 30.0000 mL | ORAL | Status: DC | PRN

## 2018-12-25 MED ORDER — GABAPENTIN 100 MG PO CAPS
200.0000 mg | ORAL_CAPSULE | Freq: Two times a day (BID) | ORAL | Status: DC
  Administered 2018-12-25 – 2018-12-27 (×5): 200 mg via ORAL
  Filled 2018-12-25 (×5): qty 2

## 2018-12-25 MED ORDER — HYDRALAZINE HCL 20 MG/ML IJ SOLN: 10.0000 mg | INTRAMUSCULAR | Status: DC | PRN

## 2018-12-25 MED ORDER — LIP MEDEX EX OINT: 1.0000 "application " | TOPICAL_OINTMENT | CUTANEOUS | Status: DC | PRN

## 2018-12-25 MED ORDER — HYDROCORTISONE (PERIANAL) 2.5 % EX CREA
1.0000 "application " | TOPICAL_CREAM | Freq: Four times a day (QID) | CUTANEOUS | Status: DC | PRN
  Filled 2018-12-25: qty 28.35

## 2018-12-25 MED ORDER — PHENOL 1.4 % MT LIQD
1.0000 | OROMUCOSAL | Status: DC | PRN
  Filled 2018-12-25: qty 177

## 2018-12-25 MED ORDER — TAMSULOSIN HCL 0.4 MG PO CAPS
0.4000 mg | ORAL_CAPSULE | Freq: Every day | ORAL | Status: DC
  Administered 2018-12-26: 0.4 mg via ORAL
  Filled 2018-12-25 (×2): qty 1

## 2018-12-25 NOTE — Progress Notes (Signed)
PROGRESS NOTE    Cory Kelly  ZMO:[id number] DOB: 1955/08/18 DOA: 12/24/2018 PCP: Mancel Bale, PA-C   Brief Narrative:  63 year old with history of pancreatic cancer with hepatic metastases status post biliary stent, essential hypertension, BPH was sent from the cancer center for evaluation of fevers and chills which had been ongoing for about a week.  Apparently there was also some signs of confusion and was on Cipro outpatient but because of no resolution of the symptoms he was recommended to come to the hospital.   Assessment & Plan:   Principal Problem:   Sepsis ([place]) Active Problems:   Essential hypertension   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[place])   Port-A-Cath in place  Sepsis of unknown etiology, present on admission Ascites, malignant versus infectious -Chest x-ray is negative.  CT of the abdomen pelvis is negative.  UA is overall unimpressive.  Unsure if this is cancer related as well.  But for now we will continue treating him with broad-spectrum antibiotics.  Vancomycin and cefepime.  I will check procalcitonin levels. -He will need paracentesis to rule out any SBP.  Will need ascites labs  Right chest wall Port-A-Cath -No obvious evidence of infection noted.  History of pancreatic cancer status post metastases - Per oncology team.  Large volume stool, constipation -We will add bowel regimen.  Nonobstructive bilateral kidney stones -IV fluids, start him on Flomax.  Peripheral neuropathy - On home gabapentin.  Essential hypertension -Resume home meds.  DVT prophylaxis: Lovenox Code Status: Full code Family Communication: None at bedside Disposition Plan: Still to be determined.  He is undergoing evaluation of sepsis of unknown etiology.  Consultants:   Oncology  Procedures:   Plans for paracentesis today  Antimicrobials:   Vancomycin day 2  Cefepime day 2   Subjective: Patient feels better after getting IV fluids and IV antibiotics but  does have some abdominal discomfort overall.  Review of Systems Otherwise negative except as per HPI, including: General: Denies fever, chills, night sweats or unintended weight loss. Resp: Denies cough, wheezing, shortness of breath. Cardiac: Denies chest pain, palpitations, orthopnea, paroxysmal nocturnal dyspnea. GI: Denies abdominal pain, nausea, vomiting, diarrhea or constipation GU: Denies dysuria, frequency, hesitancy or incontinence MS: Denies muscle aches, joint pain or swelling Neuro: Denies headache, neurologic deficits (focal weakness, numbness, tingling), abnormal gait Psych: Denies anxiety, depression, SI/HI/AVH Skin: Denies new rashes or lesions ID: Denies sick contacts, exotic exposures, travel  Objective: Vitals:   12/24/18 1630 12/24/18 2014 12/24/18 2200 12/25/18 0500  BP: 103/64  108/62 128/74  Pulse: 72  90 (!) 58  Resp: 20  20 18   Temp: 98.8 F (37.1 C) (!) 100.7 F (38.2 C) 98.9 F (37.2 C) 98 F (36.7 C)  TempSrc: Oral Oral Oral Oral  SpO2: 98%  97% 100%  Weight: 88.6 kg     Height: 6' 5.01" (1.956 m)       Intake/Output Summary (Last 24 hours) at 12/25/2018 1117 Last data filed at 12/25/2018 0801 Gross per 24 hour  Intake 3745.78 ml  Output 900 ml  Net 2845.78 ml   Filed Weights   12/24/18 1630  Weight: 88.6 kg    Examination:  General exam: Appears calm and comfortable  Respiratory system: Clear to auscultation. Respiratory effort normal. Cardiovascular system: S1 & S2 heard, RRR. No JVD, murmurs, rubs, gallops or clicks. No pedal edema. Gastrointestinal system: Abdomen is nondistended, soft and nontender. No organomegaly or masses felt. Normal bowel sounds heard.  Positive fluid wave shift  Central nervous system: Alert and oriented. No focal neurological deficits. Extremities: Symmetric 5 x 5 power. Skin: No rashes, lesions or ulcers Psychiatry: Judgement and insight appear normal. Mood & affect appropriate.  Right chest wall Port-A-Cath  noted without any evidence of infection.   Data Reviewed:   CBC: Recent Labs  Lab 12/24/18 1123 12/25/18 0725  WBC 9.3 2.3*  NEUTROABS 8.2*  --   HGB 12.0* 9.2*  HCT 37.0* 28.4*  MCV 87.5 87.9  PLT 169 60*   Basic Metabolic Panel: Recent Labs  Lab 12/24/18 1205 12/25/18 0725  NA 135 135  K 4.8 3.6  CL 100 103  CO2 20* 23  GLUCOSE 132* 104*  BUN 6* 7*  CREATININE 0.94 0.71  CALCIUM 8.9 7.9*   GFR: Estimated Creatinine Clearance: 121.5 mL/min (by C-G formula based on SCr of 0.71 mg/dL). Liver Function Tests: Recent Labs  Lab 12/24/18 1205  AST 40  ALT 73*  ALKPHOS 754*  BILITOT 0.8  PROT 6.3*  ALBUMIN 3.0*   Recent Labs  Lab 12/24/18 1328  LIPASE 24   No results for input(s): AMMONIA in the last 168 hours. Coagulation Profile: No results for input(s): INR, PROTIME in the last 168 hours. Cardiac Enzymes: No results for input(s): CKTOTAL, CKMB, CKMBINDEX, TROPONINI in the last 168 hours. BNP (last 3 results) No results for input(s): PROBNP in the last 8760 hours. HbA1C: No results for input(s): HGBA1C in the last 72 hours. CBG: No results for input(s): GLUCAP in the last 168 hours. Lipid Profile: No results for input(s): CHOL, HDL, LDLCALC, TRIG, CHOLHDL, LDLDIRECT in the last 72 hours. Thyroid Function Tests: No results for input(s): TSH, T4TOTAL, FREET4, T3FREE, THYROIDAB in the last 72 hours. Anemia Panel: No results for input(s): VITAMINB12, FOLATE, FERRITIN, TIBC, IRON, RETICCTPCT in the last 72 hours. Sepsis Labs: Recent Labs  Lab 12/24/18 1328  LATICACIDVEN 1.7    Recent Results (from the past 240 hour(s))  Culture, Blood     Status: None (Preliminary result)   Collection Time: 12/24/18 11:25 AM   Specimen: BLOOD RIGHT ARM  Result Value Ref Range Status   Specimen Description   Final    BLOOD RIGHT ARM Performed at [facility], [place] [address], [place], [place] [address]    Special Requests   Final     BOTTLES DRAWN AEROBIC AND ANAEROBIC Blood Culture adequate volume   Culture   Final    NO GROWTH < 24 HOURS Performed at [facility], [place] [address], [place], [place] [address]    Report Status PENDING  Incomplete  Culture, Blood     Status: None (Preliminary result)   Collection Time: 12/24/18 12:09 PM   Specimen: Porta Cath; Blood  Result Value Ref Range Status   Specimen Description   Final    PORTA CATH Performed at [facility], [place] [address] [place], [place] [address]    Special Requests   Final    NONE Performed at [facility], [place] [address] [place], [place] [address]    Culture   Final    NO GROWTH < 24 HOURS Performed at [place] [address], [place],  [address]    Report Status PENDING  Incomplete  Urine culture     Status: None   Collection Time: 12/24/18  1:28 PM   Specimen: Urine, Clean Catch  Result Value Ref Range Status   Specimen Description   Final  URINE, CLEAN CATCH Performed at [facility], [place] [address] [place], [place] [address]    Special Requests   Final    NONE Performed at [facility], [place] [address] [place], [place] [address]    Culture   Final    NO GROWTH Performed at [facility], [place] [address], [place], [place] [address]    Report Status 12/25/2018 FINAL  Final  SARS Coronavirus 2 (CEPHEID - Performed in [place] hospital lab), Hosp Order     Status: None   Collection Time: 12/24/18  1:28 PM   Specimen: Nasopharyngeal Swab  Result Value Ref Range Status   SARS Coronavirus 2 NEGATIVE NEGATIVE Final    Comment: (NOTE) If result is NEGATIVE SARS-CoV-2 target nucleic acids are NOT DETECTED. The SARS-CoV-2 RNA is generally detectable in upper and lower  respiratory specimens during the acute phase of infection. The lowest  concentration of SARS-CoV-2 viral copies this assay can detect is 250  copies /  mL. A negative result does not preclude SARS-CoV-2 infection  and should not be used as the sole basis for treatment or other  patient management decisions.  A negative result may occur with  improper specimen collection / handling, submission of specimen other  than nasopharyngeal swab, presence of viral mutation(s) within the  areas targeted by this assay, and inadequate number of viral copies  (<250 copies / mL). A negative result must be combined with clinical  observations, patient history, and epidemiological information. If result is POSITIVE SARS-CoV-2 target nucleic acids are DETECTED. The SARS-CoV-2 RNA is generally detectable in upper and lower  respiratory specimens dur ing the acute phase of infection.  Positive  results are indicative of active infection with SARS-CoV-2.  Clinical  correlation with patient history and other diagnostic information is  necessary to determine patient infection status.  Positive results do  not rule out bacterial infection or co-infection with other viruses. If result is PRESUMPTIVE POSTIVE SARS-CoV-2 nucleic acids MAY BE PRESENT.   A presumptive positive result was obtained on the submitted specimen  and confirmed on repeat testing.  While 2019 novel coronavirus  (SARS-CoV-2) nucleic acids may be present in the submitted sample  additional confirmatory testing may be necessary for epidemiological  and / or clinical management purposes  to differentiate between  SARS-CoV-2 and other Sarbecovirus currently known to infect humans.  If clinically indicated additional testing with an alternate test  methodology [id number]) is advised. The SARS-CoV-2 RNA is generally  detectable in upper and lower respiratory sp ecimens during the acute  phase of infection. The expected result is Negative. Fact Sheet for Patients:  StrictlyIdeas.no Fact Sheet for Healthcare Providers: BankingDealers.co.za This test is  not yet approved or cleared by the [place] FDA and has been authorized for detection and/or diagnosis of SARS-CoV-2 by FDA under an Emergency Use Authorization (EUA).  This EUA will remain in effect (meaning this test can be used) for the duration of the COVID-19 declaration under Section 564(b)(1) of the Act, 21 U.S.C. section 360bbb-3(b)(1), unless the authorization is terminated or revoked sooner. Performed at [facility], [place] [address], [place],  [address]   Culture, blood (routine x 2)     Status: None (Preliminary result)   Collection Time: 12/24/18  5:22 PM   Specimen: Left Antecubital; Blood  Result Value Ref Range Status   Specimen Description   Final    LEFT ANTECUBITAL Performed at [facility],  [place] [address], [place], [place] [address]    Special Requests   Final    BOTTLES DRAWN AEROBIC AND ANAEROBIC Blood Culture results may not be optimal due to an excessive volume of blood received in culture bottles Performed at [place] [address] [place], [place] [address]    Culture   Final    NO GROWTH < 12 HOURS Performed at [place] [address] [place], [place] [address]    Report Status PENDING  Incomplete  Culture, blood (routine x 2)     Status: None (Preliminary result)   Collection Time: 12/24/18  5:29 PM   Specimen: Right Antecubital; Blood  Result Value Ref Range Status   Specimen Description   Final    RIGHT ANTECUBITAL Performed at [place] [address] [place], [place] [address]    Special Requests   Final    BOTTLES DRAWN AEROBIC AND ANAEROBIC Blood Culture adequate volume Performed at [place] [address] [place], [place] [address]    Culture   Final    NO GROWTH < 12 HOURS Performed at [place] [address] [place],  [address]    Report Status PENDING  Incomplete          Radiology Studies: Ct Abdomen Pelvis W Contrast  Result Date: 12/24/2018 CLINICAL DATA:  Abdominal distension, fever, history of pancreatic cancer EXAM: CT ABDOMEN AND PELVIS WITH CONTRAST TECHNIQUE: Multidetector CT imaging of the abdomen and pelvis was performed using the standard protocol following bolus administration of intravenous contrast. CONTRAST:  145mL OMNIPAQUE IOHEXOL 300 MG/ML  SOLN COMPARISON:  MR abdomen pelvis, 12/04/2018, 10/30/2018 FINDINGS: Lower chest: No acute abnormality. Hepatobiliary: Known liver metastases are poorly appreciated by single-phase CT, for example in the dome of the liver (series 2, image 14). No gallstones or gallbladder wall thickening. Common bile duct stent is positioned tip in the descending portion of the duodenum with post stenting pneumobilia. Pancreas: Discrete pancreatic head mass is not well appreciated, primarily appreciated by mass effect on the confluence of the portal vein (series 2, image 38. There is atrophy of the pancreatic body and tail and dilatation pancreatic duct. Spleen: Splenomegaly. Adrenals/Urinary Tract: Adrenal glands are unremarkable. Nonobstructive bilateral nephrolithiasis. Bladder is unremarkable. Stomach/Bowel: Stomach is within normal limits. Appendix is not clearly visualized. No evidence of bowel wall thickening, distention, or inflammatory changes. Large burden of stool in the distal colon and rectum. Vascular/Lymphatic: Aortic atherosclerosis. No enlarged abdominal or pelvic lymph nodes. Reproductive: No mass or other significant abnormality. Other: No abdominal wall hernia or abnormality. Moderate volume ascites. Musculoskeletal: No acute or significant osseous findings. IMPRESSION: 1. Redemonstrated pancreatic malignancy with hepatic metastases, status post common bile duct stenting. Primary mass and hepatic metastases are better appreciated by recent multiphasic MRI. 2.  Moderate volume ascites, which is similar to prior  MRI. 3.  Splenomegaly. 4.  Large burden of stool in the distal colon and rectum. 5.  Nonobstructive bilateral nephrolithiasis. Electronically Signed   By: Eddie Candle M.D.   On: 12/24/2018 14:35   Dg Chest Portable 1 View  Result Date: 12/24/2018 CLINICAL DATA:  Fever and weakness. EXAM: PORTABLE CHEST 1 VIEW COMPARISON:  11/04/2018 FINDINGS: The cardiac silhouette, mediastinal and hilar contours are stable. Mild cardiac enlargement and mild tortuosity of the thoracic aorta. The right IJ power port is stable. The lungs are clear. No pleural effusions. The bony thorax is intact. IMPRESSION: No acute  cardiopulmonary findings. Electronically Signed   By: Marijo Sanes M.D.   On: 12/24/2018 13:59        Scheduled Meds:  enoxaparin (LOVENOX) injection  40 mg Subcutaneous Q24H   polyethylene glycol  17 g Oral Daily   Continuous Infusions:  sodium chloride 100 mL/hr at 12/25/18 9390   ceFEPime (MAXIPIME) IV 2 g (12/25/18 0448)   vancomycin Stopped (12/25/18 0210)     LOS: 1 day   Time spent= 35 mins    Charitie Hinote Arsenio Loader, MD [facility]  If 7PM-7AM, please contact night-coverage [email or web address] 12/25/2018, 11:17 AM

## 2018-12-25 NOTE — Progress Notes (Addendum)
HEMATOLOGY-ONCOLOGY PROGRESS NOTE  SUBJECTIVE: Feels better this morning.  States that he is still having chills.  No fever since last evening.  Reports that his Kelly feels full but denies abdominal pain, nausea, vomiting, constipation, diarrhea.  He has no other complaints this morning.  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[place])   08/31/2017 - 11/22/2017 Chemotherapy   The patient had palonosetron (ALOXI) injection 0.25 mg, 0.25 mg, Intravenous,  Once, 6 of 6 cycles Administration: 0.25 mg (08/31/2017), 0.25 mg (09/14/2017), 0.25 mg (09/28/2017), 0.25 mg (10/12/2017), 0.25 mg (10/26/2017), 0.25 mg (11/09/2017) pegfilgrastim-cbqv (UDENYCA) injection 6 mg, 6 mg, Subcutaneous, Once, 6 of 6 cycles Administration: 6 mg (09/02/2017), 6 mg (09/16/2017), 6 mg (09/30/2017), 6 mg (10/14/2017), 6 mg (10/28/2017), 6 mg (11/11/2017) irinotecan (CAMPTOSAR) 360 mg in sodium chloride 0.9 % 500 mL chemo infusion, 150 mg/m2 = 360 mg (100 % of original dose 150 mg/m2), Intravenous,  Once, 6 of 6 cycles Dose modification: 150 mg/m2 (original dose 150 mg/m2, Cycle 1, Reason: Provider Judgment) Administration: 360 mg (08/31/2017), 360 mg (09/14/2017), 360 mg (09/28/2017), 340 mg (10/12/2017), 340 mg (10/26/2017), 340 mg (11/09/2017) leucovorin 952 mg in sodium chloride 0.9 % 250 mL infusion, 400 mg/m2 = 952 mg, Intravenous,  Once, 6 of 6 cycles Administration: 952 mg (08/31/2017), 952 mg (09/14/2017), 952 mg (09/28/2017), 888 mg (10/12/2017), 888 mg (10/26/2017), 888 mg (11/09/2017) oxaliplatin (ELOXATIN) 200 mg in dextrose 5 % 500 mL chemo infusion, 85 mg/m2 = 200 mg, Intravenous,  Once, 6 of 6 cycles Dose modification: 65 mg/m2 (original dose 85 mg/m2, Cycle 4, Reason: Provider Judgment) Administration: 200 mg (08/31/2017), 200 mg (09/14/2017), 200 mg (09/28/2017), 145 mg (10/12/2017), 150 mg (10/26/2017), 150 mg (11/09/2017) fosaprepitant (EMEND) 150 mg,  dexamethasone (DECADRON) 12 mg in sodium chloride 0.9 % 145 mL IVPB, , Intravenous,  Once, 6 of 6 cycles Administration:  (08/31/2017),  (09/14/2017),  (09/28/2017),  (10/12/2017),  (10/26/2017),  (11/09/2017) fluorouracil (ADRUCIL) 5,700 mg in sodium chloride 0.9 % 136 mL chemo infusion, 2,400 mg/m2 = 5,700 mg, Intravenous, 1 Day/Dose, 6 of 6 cycles Administration: 5,700 mg (08/31/2017), 5,700 mg (09/14/2017), 5,700 mg (09/28/2017), 5,350 mg (10/12/2017), 5,350 mg (10/26/2017), 5,350 mg (11/09/2017)  for chemotherapy treatment.    12/06/2017 - 02/28/2018 Chemotherapy   The patient had gemcitabine (GEMZAR) 1,786 mg in sodium chloride 0.9 % 250 mL chemo infusion, 800 mg/m2 = 1,786 mg (80 % of original dose 1,000 mg/m2), Intravenous,  Once, 4 of 4 cycles Dose modification: 800 mg/m2 (original dose 1,000 mg/m2, Cycle 1, Reason: Provider Judgment), 800 mg/m2 (original dose 1,000 mg/m2, Cycle 1, Reason: Provider Judgment, Comment: call from MD to decrease dose due to counts) Administration: 1,786 mg (12/20/2017), 1,786 mg (12/06/2017), 1,786 mg (01/03/2018), 1,786 mg (01/17/2018), 1,786 mg (01/31/2018), 1,786 mg (02/14/2018), 1,786 mg (02/28/2018) PACLitaxel-protein bound (ABRAXANE) chemo infusion 225 mg, 100 mg/m2 = 225 mg (80 % of original dose 125 mg/m2), Intravenous, Once, 3 of 3 cycles Dose modification: 100 mg/m2 (original dose 125 mg/m2, Cycle 1, Reason: Provider Judgment), 100 mg/m2 (original dose 125 mg/m2, Cycle 1, Reason: Provider Judgment, Comment: call from MD to decrease dose based on counts) Administration: 225 mg (12/20/2017), 225 mg (12/06/2017), 225 mg (01/03/2018), 225 mg (01/17/2018)  for chemotherapy treatment.    07/04/2018 Cancer Staging   Staging form: Exocrine Pancreas, AJCC 8th Edition - Pathologic: Stage IV (cM1) - Signed by Ladell Pier, MD on 07/04/2018  08/21/2018 -  Chemotherapy   The patient had palonosetron (ALOXI) injection 0.25 mg, 0.25 mg, Intravenous,  Once, 7 of 8 cycles Administration: 0.25 mg  (08/21/2018), 0.25 mg (09/03/2018), 0.25 mg (09/18/2018), 0.25 mg (10/01/2018), 0.25 mg (10/16/2018), 0.25 mg (11/14/2018), 0.25 mg (12/10/2018) pegfilgrastim-cbqv (UDENYCA) injection 6 mg, 6 mg, Subcutaneous, Once, 7 of 8 cycles Administration: 6 mg (08/23/2018), 6 mg (09/05/2018), 6 mg (09/20/2018), 6 mg (10/03/2018), 6 mg (10/18/2018), 6 mg (11/16/2018) irinotecan (CAMPTOSAR) 360 mg in dextrose 5 % 500 mL chemo infusion, 150 mg/m2 = 360 mg (100 % of original dose 150 mg/m2), Intravenous,  Once, 7 of 8 cycles Dose modification: 150 mg/m2 (original dose 150 mg/m2, Cycle 1, Reason: Provider Judgment), 112.5 mg/m2 (original dose 150 mg/m2, Cycle 1, Reason: Change in LFTs), 90 mg/m2 (original dose 150 mg/m2, Cycle 6, Reason: Provider Judgment), 90 mg/m2 (original dose 90 mg/m2, Cycle 7) Administration: 260 mg (08/21/2018), 260 mg (09/03/2018), 260 mg (09/18/2018), 260 mg (10/01/2018), 200 mg (11/14/2018), 200 mg (10/16/2018), 200 mg (12/10/2018) leucovorin 952 mg in dextrose 5 % 250 mL infusion, 400 mg/m2 = 952 mg, Intravenous,  Once, 7 of 8 cycles Dose modification: 300 mg/m2 (original dose 400 mg/m2, Cycle 1, Reason: Provider Judgment), 300 mg/m2 (original dose 300 mg/m2, Cycle 7) Administration: 696 mg (08/21/2018), 696 mg (09/03/2018), 696 mg (09/18/2018), 676 mg (10/01/2018), 676 mg (11/14/2018), 676 mg (10/16/2018), 676 mg (12/10/2018) oxaliplatin (ELOXATIN) 155 mg in dextrose 5 % 500 mL chemo infusion, 65 mg/m2 = 155 mg (100 % of original dose 65 mg/m2), Intravenous,  Once, 7 of 8 cycles Dose modification: 65 mg/m2 (original dose 65 mg/m2, Cycle 1, Reason: Provider Judgment) Administration: 150 mg (08/21/2018), 150 mg (09/03/2018), 150 mg (09/18/2018), 150 mg (10/01/2018), 150 mg (10/16/2018), 150 mg (11/14/2018), 150 mg (12/10/2018) fosaprepitant (EMEND) 150 mg, dexamethasone (DECADRON) 12 mg in sodium chloride 0.9 % 145 mL IVPB, , Intravenous,  Once, 7 of 8 cycles Administration:  (08/21/2018),  (09/03/2018),  (09/18/2018),  (10/01/2018),   (10/16/2018),  (11/14/2018),  (12/10/2018) fluorouracil (ADRUCIL) 5,700 mg in sodium chloride 0.9 % 136 mL chemo infusion, 2,400 mg/m2 = 5,700 mg, Intravenous, 1 Day/Dose, 7 of 8 cycles Dose modification: 1,800 mg/m2 (original dose 2,400 mg/m2, Cycle 1, Reason: Change in LFTs) Administration: 4,200 mg (08/21/2018), 4,200 mg (09/03/2018), 4,200 mg (09/18/2018), 4,050 mg (10/01/2018), 4,050 mg (10/16/2018), 4,050 mg (11/14/2018), 4,050 mg (12/10/2018)  for chemotherapy treatment.       REVIEW OF SYSTEMS:   Constitutional: Fever last evening.  Has intermittent chills. Eyes: Denies blurriness of vision Ears, nose, mouth, throat, and face: Denies mucositis or sore throat Respiratory: Denies cough, dyspnea or wheezes Cardiovascular: Denies palpitation, chest discomfort Gastrointestinal: Has abdominal fullness but no abdominal pain, nausea, vomiting, constipation, diarrhea. Skin: Denies abnormal skin rashes Lymphatics: Denies new lymphadenopathy or easy bruising Neurological:Denies numbness, tingling or new weaknesses Behavioral/Psych: Mood is stable, no new changes  Extremities: No lower extremity edema All other systems were reviewed with the patient and are negative.  I have reviewed the past medical history, past surgical history, social history and family history with the patient and they are unchanged from previous note.   PHYSICAL EXAMINATION:  Vitals:   12/24/18 2200 12/25/18 0500  BP: 108/62 128/74  Pulse: 90 (!) 58  Resp: 20 18  Temp: 98.9 F (37.2 C) 98 F (36.7 C)  SpO2: 97% 100%   Filed Weights   12/24/18 1630  Weight: 195 lb 5.2 oz (88.6 kg)    Intake/Output from previous day: 06/22  0701 - 06/23 0700 In: 3288.9 [P.O.:835; I.V.:858.9; IV Piggyback:1595] Out: 650 [Urine:650]  GENERAL:alert, no distress and comfortable SKIN: skin color, texture, turgor are normal, no rashes or significant lesions OROPHARYNX:no exudate, no erythema and lips, buccal mucosa, and tongue normal   NECK: supple, thyroid normal size, non-tender, without nodularity LYMPH:  no palpable lymphadenopathy in the cervical, axillary or inguinal LUNGS: clear to auscultation and percussion with normal breathing effort HEART: regular rate & rhythm and no murmurs and no lower extremity edema Kelly: Positive bowel sounds.  Ascites noted.  No abdominal pain with palpation. Musculoskeletal:no cyanosis of digits and no clubbing  NEURO: alert & oriented x 3 with fluent speech, no focal motor/sensory deficits  LABORATORY DATA:  I have reviewed the data as listed CMP Latest Ref Rng & Units 12/25/2018 12/24/2018 12/10/2018  Glucose 70 - 99 mg/dL 104(H) 132(H) 184(H)  BUN 8 - 23 mg/dL 7(L) 6(L) 6(L)  Creatinine 0.61 - 1.24 mg/dL 0.71 0.94 0.76  Sodium 135 - 145 mmol/L 135 135 134(L)  Potassium 3.5 - 5.1 mmol/L 3.6 4.8 3.8  Chloride 98 - 111 mmol/L 103 100 100  CO2 22 - 32 mmol/L 23 20(L) 25  Calcium 8.9 - 10.3 mg/dL 7.9(L) 8.9 8.1(L)  Total Protein 6.5 - 8.1 g/dL - 6.3(L) 5.7(L)  Total Bilirubin 0.3 - 1.2 mg/dL - 0.8 0.9  Alkaline Phos 38 - 126 U/L - 754(H) 818(H)  AST 15 - 41 U/L - 40 47(H)  ALT 0 - 44 U/L - 73(H) 83(H)    Lab Results  Component Value Date   WBC 2.3 (L) 12/25/2018   HGB 9.2 (L) 12/25/2018   HCT 28.4 (L) 12/25/2018   MCV 87.9 12/25/2018   PLT 60 (L) 12/25/2018   NEUTROABS 8.2 (H) 12/24/2018    Cory Kelly W Wo Contrast  Result Date: 12/04/2018 CLINICAL DATA:  Follow-up pancreas cancer. Assess for worsening of disease. EXAM: MRI Kelly WITHOUT AND WITH CONTRAST TECHNIQUE: Multiplanar multisequence Cory imaging of the Kelly was performed both before and after the administration of intravenous contrast. CONTRAST:  10 cc Gadavist COMPARISON:  11/06/2018 FINDINGS: Lower chest: Tiny bilateral pleural effusions are identified, similar to previous exam. Hepatobiliary: Multiple liver metastases are again noted. Index lesion within segment 5 measures 1.7 by 1.3 cm, image 44/1003.  Previously 1.7 x 1.5 cm. The index lesion within segment 2 measures 1.0 by 0.8 cm, image 29/1003. Previously 1.2 x 0.8 cm. New lesion within segment 4 a measures 1 by 0.9 cm, image 31/1002. The remainder of the liver metastasis are relatively stable in the interval. Gallbladder unremarkable. Common bile duct stent in place without intrahepatic bile duct dilatation. Pancreas: The soft tissue mass within the head of pancreas measures approximately 3.7 x 4.1 cm, image 59/1001. Previously 3.5 x 3.1 cm. Diffuse atrophy and pancreatic ductal dilatation is again noted within the body and tail. Encasement and obstruction of the portal venous confluence is again identified with distal reconstitution of the portal vein via collaterals. Spleen: Splenomegaly is again noted. The spleen has a cranial caudal dimension 20.1 cm. Similar. Adrenals/Urinary Tract: Normal appearance of the adrenal glands. No kidney mass or hydronephrosis. Simple appearing inferior pole right kidney cyst measures 2.7 cm. Stomach/Bowel: Visualized portions within the Kelly are unremarkable. Vascular/Lymphatic: No evidence for abdominal aortic aneurysm. Esophageal and splenic varices as well as portosystemic venous collaterals compatible with portal venous hypertension. No adenopathy identified. Other:  Interval increase in volume of abdominal ascites. Musculoskeletal: No suspicious bone lesions identified. IMPRESSION: 1.  There is been mild increase in size of mass involving head of pancreas. 2. Diffuse hepatic metastasis. The previous index lesions are unchanged in size compared with previous exam. A single new metastasis is identified within the medial segment of left lobe of liver. 3. 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 Abdominal ascites has increased in the interval. Electronically Signed   By: Kerby Moors M.D.   On: 12/04/2018 11:27   Ct Kelly Pelvis W Contrast  Result Date: 12/24/2018 CLINICAL DATA:   Abdominal distension, fever, history of pancreatic cancer EXAM: CT Kelly AND PELVIS WITH CONTRAST TECHNIQUE: Multidetector CT imaging of the Kelly and pelvis was performed using the standard protocol following bolus administration of intravenous contrast. CONTRAST:  13m OMNIPAQUE IOHEXOL 300 MG/ML  SOLN COMPARISON:  Cory Kelly pelvis, 12/04/2018, 10/30/2018 FINDINGS: Lower chest: No acute abnormality. Hepatobiliary: Known liver metastases are poorly appreciated by single-phase CT, for example in the dome of the liver (series 2, image 14). No gallstones or gallbladder wall thickening. Common bile duct stent is positioned tip in the descending portion of the duodenum with post stenting pneumobilia. Pancreas: Discrete pancreatic head mass is not well appreciated, primarily appreciated by mass effect on the confluence of the portal vein (series 2, image 38. There is atrophy of the pancreatic body and tail and dilatation pancreatic duct. Spleen: Splenomegaly. Adrenals/Urinary Tract: Adrenal glands are unremarkable. Nonobstructive bilateral nephrolithiasis. Bladder is unremarkable. Stomach/Bowel: Stomach is within normal limits. Appendix is not clearly visualized. No evidence of bowel wall thickening, distention, or inflammatory changes. Large burden of stool in the distal colon and rectum. Vascular/Lymphatic: Aortic atherosclerosis. No enlarged abdominal or pelvic lymph nodes. Reproductive: No mass or other significant abnormality. Other: No abdominal wall hernia or abnormality. Moderate volume ascites. Musculoskeletal: No acute or significant osseous findings. IMPRESSION: 1. Redemonstrated pancreatic malignancy with hepatic metastases, status post common bile duct stenting. Primary mass and hepatic metastases are better appreciated by recent multiphasic MRI. 2.  Moderate volume ascites, which is similar to prior MRI. 3.  Splenomegaly. 4.  Large burden of stool in the distal colon and rectum. 5.  Nonobstructive  bilateral nephrolithiasis. Electronically Signed   By: AEddie CandleM.D.   On: 12/24/2018 14:35   Cory Kelly At Scanner  Result Date: 12/04/2018 CLINICAL DATA:  Follow-up pancreas cancer. Assess for worsening of disease. EXAM: MRI Kelly WITHOUT AND WITH CONTRAST TECHNIQUE: Multiplanar multisequence Cory imaging of the Kelly was performed both before and after the administration of intravenous contrast. CONTRAST:  10 cc Gadavist COMPARISON:  11/06/2018 FINDINGS: Lower chest: Tiny bilateral pleural effusions are identified, similar to previous exam. Hepatobiliary: Multiple liver metastases are again noted. Index lesion within segment 5 measures 1.7 by 1.3 cm, image 44/1003. Previously 1.7 x 1.5 cm. The index lesion within segment 2 measures 1.0 by 0.8 cm, image 29/1003. Previously 1.2 x 0.8 cm. New lesion within segment 4 a measures 1 by 0.9 cm, image 31/1002. The remainder of the liver metastasis are relatively stable in the interval. Gallbladder unremarkable. Common bile duct stent in place without intrahepatic bile duct dilatation. Pancreas: The soft tissue mass within the head of pancreas measures approximately 3.7 x 4.1 cm, image 59/1001. Previously 3.5 x 3.1 cm. Diffuse atrophy and pancreatic ductal dilatation is again noted within the body and tail. Encasement and obstruction of the portal venous confluence is again identified with distal reconstitution of the portal vein via collaterals. Spleen: Splenomegaly is again noted. The spleen has a cranial caudal dimension  20.1 cm. Similar. Adrenals/Urinary Tract: Normal appearance of the adrenal glands. No kidney mass or hydronephrosis. Simple appearing inferior pole right kidney cyst measures 2.7 cm. Stomach/Bowel: Visualized portions within the Kelly are unremarkable. Vascular/Lymphatic: No evidence for abdominal aortic aneurysm. Esophageal and splenic varices as well as portosystemic venous collaterals compatible with portal venous hypertension. No  adenopathy identified. Other:  Interval increase in volume of abdominal ascites. Musculoskeletal: No suspicious bone lesions identified. IMPRESSION: 1. There is been mild increase in size of mass involving head of pancreas. 2. Diffuse hepatic metastasis. The previous index lesions are unchanged in size compared with previous exam. A single new metastasis is identified within the medial segment of left lobe of liver. 3. 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 Abdominal ascites has increased in the interval. Electronically Signed   By: Kerby Moors M.D.   On: 12/04/2018 11:27   Dg Chest Portable 1 View  Result Date: 12/24/2018 CLINICAL DATA:  Fever and weakness. EXAM: PORTABLE CHEST 1 VIEW COMPARISON:  11/04/2018 FINDINGS: The cardiac silhouette, mediastinal and hilar contours are stable. Mild cardiac enlargement and mild tortuosity of the thoracic aorta. The right IJ power port is stable. The lungs are clear. No pleural effusions. The bony thorax is intact. IMPRESSION: No acute cardiopulmonary findings. Electronically Signed   By: Marijo Sanes M.D.   On: 12/24/2018 13:59   Dg Ercp  Result Date: 12/06/2018 CLINICAL DATA:  63 year old male with a history of pancreatic cancer and obstructed jaundice with prior stent placement and progressive liver enzyme elevation. EXAM: ERCP TECHNIQUE: Multiple spot images obtained with the fluoroscopic device and submitted for interpretation post-procedure. FLUOROSCOPY TIME:  Fluoroscopy Time:  3 minutes 43 seconds Radiation Exposure Index (if provided by the fluoroscopic device): 66.7 mGy COMPARISON:  MRCP 12/04/2018 FINDINGS: A total of 4 intraoperative saved images are submitted for review. The images demonstrate a metal stent in the common hepatic and common bile ducts with a flexible endoscope in the descending duodenum. Subsequent images demonstrate wire cannulation of the stent and balloon dilation. IMPRESSION: ERCP with cannulation  of the previously placed metal stent and balloon dilatation. These images were submitted for radiologic interpretation only. Please see the procedural report for the amount of contrast and the fluoroscopy time utilized. Electronically Signed   By: Jacqulynn Cadet M.D.   On: 12/06/2018 11:51   Ir Kelly [place] Limited  Result Date: 11/29/2018 CLINICAL DATA:  Metastatic pancreas adenocarcinoma, abdominal distension assess for ascites EXAM: LIMITED Kelly ULTRASOUND FOR ASCITES TECHNIQUE: Limited ultrasound survey for ascites was performed in all four abdominal quadrants. COMPARISON:  None. FINDINGS: Ultrasound survey of the abdominal 4 quadrants demonstrates trace amount of lower abdominopelvic ascites. Not enough to warrant therapeutic paracentesis. Procedure not performed. IMPRESSION: Trace amount of abdominopelvic ascites by ultrasound. Procedure not performed. Electronically Signed   By: Jerilynn Mages.  Shick M.D.   On: 11/29/2018 13:25    ASSESSMENT AND PLAN: 1. Pancreas head mass  CT Kelly/pelvis 08/03/2017-fullness in the head of the pancreas with stranding in the peripancreatic fat and pancreatic ductal dilatation.   MRI Kelly 08/04/2017-findings favored to represent acute on chronic pancreatitis; equivocal soft tissue fullness within the head and uncinate process of the pancreas; indeterminate too small to characterize right hepatic lobe lesion; small volume abdominal ascites; splenomegaly.   CA-19-9 elevated at 977on 08/04/2017.  Status postupper EUS 08/10/2017-findings of an irregular masslike region in the pancreatic head measuring approximately 2.7 cm. This directly abutted the main portal vein but no other major  vascular structures. Biopsies obtained with preliminary cytology positive for malignancy, likely adenocarcinoma. The final report is pending. The common bile duct and main pancreatic duct were both dilated. ERCP was then proceeded with forstent placement. Multiple attempts were made  tocannulate the bile duct without success. The procedure was aborted.  08/16/2017 ERCP with placement of a metal biliary stent in the common bile duct by Dr. Francella Solian at [facility].  Cycle 1 FOLFIRINOX 08/31/2017  Cycle 2 FOLFIRINOX 09/14/2017  Cycle 3 FOLFIRINOX 09/28/2017  Cycle 4 FOLFIRINOX 10/12/2017 (oxaliplatin dose reduced secondary to thrombocytopenia)  Cycle 5 FOLFIRINOX 10/26/2017  Cycle 6 FOLFIRINOX 11/09/2017  Restaging CTs at [facility] 11/29/2017-no definitive evidence of distant metastatic disease. 2 subcentimeter liver lesions described on prior MRI not visualized on CT. Ill-defined pancreatic head mass stable to decreased in size measuring 2.1 x 2 cm. Peripancreatic inflammatory stranding decreased from prior. No biliary ductal dilatation. Celiac axis less than 180 degrees abutment. Common hepatic artery with greater than 180 degrees encasement; superior mesenteric artery with short segment less than 180 degrees abutment; portal vein/superior mesenteric vein with greater than 180 degrees encasement of the extrahepatic portal vein with associated circumferential narrowing at the portomesenteric, overall substantially improved from prior examination. Now less than 180 degree abutment of the SMV. The portal vein and SMV remain patent. Splenic vein patent.  Cycle 1 gemcitabine/Abraxane 12/06/2017  Cycle 2 gemcitabine/Abraxane 12/20/2017  Cycle 3 gemcitabine/Abraxane 01/03/2018  Cycle 4 gemcitabine/Abraxane 01/17/2018  Cycle 5 gemcitabine 01/31/2018 (Abraxane held due to neuropathy)  Cycle 6 gemcitabine 02/14/2018 (Abraxane held due to neuropathy)  Cycle 7 gemcitabine 02/28/2018 (Abraxane held due to neuropathy)  CT chest/Kelly/pelvis at [facility] 03/08/2018-stable appearance of previously identified infiltrating pancreatic head mass. No CT evidence of metastatic disease.  SBRT to the pancreas 04/05/2018-04/11/2018  05/09/2018 CA-19-9 2138   MRI Kelly 05/09/2018-similar 6 mm hypoenhancing  focus posterior right lobe liver; more superior lesion near the dome of the liver not identified on the postcontrast imaging but there is a persistent focus of diffusion signal abnormality in this region; new 8 mm hypoenhancing focus located just superior to the gallbladder; additional tiny focus of diffusion abnormality located more superiorly without obvious associated abnormal enhancement. Ill-defined pancreatic head mass not felt to be significantly changed.  CT chest/Kelly/pelvis 05/10/2018-ill-defined hypoattenuating pancreatic head mass and ill-defined soft tissue abutting the celiac axis with mildly increased dilatation of the main pancreatic duct. New soft tissue nodule along the right upper lobe bronchus; hypoattenuating liver lesions concerning for metastasis.  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 The stent traverses the pancreatic mass and extends into the duodenum. Increase in size of 2 liver lesions. Pancreatic mass appears similar. Indistinct tissue planes around the duodenum along with wall thickening in the gallbladder and especially the gallbladder neck.  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  Cycle  1 FOLFIRINOX 08/21/2018  Cycle 2 FOLFIRINOX 09/03/2018  Cycle 3 FOLFIRINOX 09/18/2018  Cycle 4 FOLFIRINOX 10/01/2018  Cycle 5 FOLFIRINOX 10/16/2018 (irinotecan dose reduced secondary to diarrhea)  CTs 10/30/2018-slight enlargement of pancreas mass, hepatic lesions seen on MRI are not visualized, probable metastasis at the gallbladder fossa, no biliary duct dilatation, gallbladder wall thickening-new, small amount of fluid in the pericolic gutters, right perihepatic margin, and pelvis, thickening at the ascending colon  MRI Kelly 11/06/2018-stable pancreas head mass, obstruction of the portal venous confluence with cavernous transformation of the portal vein, splenomegaly, portal hypertension, mild decrease in the size of hepatic metastases, no evidence of progressive disease  Cycle 6 FOLFIRINOX5/13/2020  MRI Kelly 12/04/2018 with mild increase in size of mass involving head of the pancreas. Diffuse hepatic metastasis. Previous index lesions unchanged in size. Single new metastasis identified within the medial segment left lobe of liver. 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 Abdominal ascites increased in the interval.  Cycle 7 FOLFIRINOX6/02/2019 2. Hypertension 3. Port-A-Cath placement 08/30/2017 4. History of elevated bilirubin-questionGilbert's syndrome 5. Mild leukopenia, thrombocytopenia-question secondary to portal hypertension/splenomegaly;09/14/2017 white count improved, platelet count stable;progressive thrombocytopenia following gemcitabine/Abraxane 6. Kidney stones 7. Fever following cycles 2 and 3 gemcitabine/Abraxane,? Fever related to gemcitabine 8.Jaundice, elevated liver enzymes and bilirubin 06/22/2018; status post ERCP 06/25/2018 with debris removed from the existing stent and a new plastic stent placed. Recurrent jaundice 07/19/2018 status post ERCP 07/20/2018 with finding that the previously placed plastic stent  had migrated distally. There was debris and sludge on the stent. The previously placed stent was removed. 5 to 8 mm stricture just proximal to the existing SEMS. 2 plastic stents were placed.ERCP 08/16/2018-2 plastic biliary stents were removed from within the previously placed partially covered SEMS; stricture confirmed just proximal to the previously placed SEMS, treated with placement of an 8 cm long 10 mm diameter uncovered SEMS in good position. 9.Hospitalized with bacteremia Streptococcus group C 11/04/2018 through 11/08/2018. IV ceftriaxone through 11/17/2018. TEE 11/08/2018 negative. 10.Elevated LFTs 12/03/2018-MRI Kelly 12/04/2018 with mild increase in size of mass involving head of the pancreas. Diffuse hepatic metastasis. Previous index lesions unchanged in size. Single new metastasis identified within the medial segment left lobe of liver. 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 Abdominal ascites increased in the interval. 12/06/2018 ERCP-previously placed uncovered 8 cm long metal biliary stent nearly filled with bio debris and flecks of tissue ingrowth. Stent cleared. 11. 12/24/18 Symptom management visit for fever, chills. Tmax 100.6 ax at home. Patient with acute mental status change on exam. Sent to ED for work up and likely hospital admission, suspect biliary sepsis.   Cory. Kelly was admitted for possible biliary sepsis.  Blood cultures have been drawn and are negative to date.  He is on IV antibiotics and having intermittent fevers with chills.  Ascites was noted on his CT scan. CBC from today shows mild pancytopenia likely due to recent chemotherapy.  1.  We will request paracentesis be performed by interventional radiology.  Fluid will be sent for cytology, Gram stain, and culture. 2.  Await blood culture results.  Continue antibiotics per hospitalist. 3.  Monitor daily CBC.  Will consider a packed red blood cell transfusion if his hemoglobin  is less than 7.0 or a platelet transfusion if his platelet count is less than 10,000 or active bleeding.  No transfusion is indicated today.  Check WBC differential 4.  Discontinue Lovenox if platelet count lower on 12/26/2018     LOS:  1 day   Mikey Bussing, DNP, AGPCNP-BC, AOCNP 12/25/18 Cory. Carel was interviewed and examined.  His clinical status appears much improved compared to when we saw him at the Cancer center yesterday.  He was admitted with sepsis syndrome.  I suspect biliary sepsis related to the biliary obstruction and stent.  However he has persistent ascites and could have peritonitis.  I recommend proceeding with a diagnostic paracentesis.  The pancytopenia is secondary to sepsis and recent chemotherapy.  We will check a white cell differential.

## 2018-12-25 NOTE — Progress Notes (Signed)
PT Cancellation Note  Patient Details Name: Cory Kelly MRN: [id number] DOB: July 07, 1955   Cancelled Treatment:    Reason Eval/Treat Not Completed: Fatigue/lethargy limiting ability to participate - Pt just arrived to room post-paracentesis. RN requesting PT give pt time to rest, will try later this pm.  Julien Girt, PT Acute Rehabilitation Services Pager [phone]  Office [phone]    Roxine Caddy D Elonda Husky 12/25/2018, 12:05 PM

## 2018-12-25 NOTE — Telephone Encounter (Signed)
No los per 6/22. °

## 2018-12-25 NOTE — Evaluation (Signed)
Physical Therapy Evaluation - One time evaluation Patient Details Name: Cory Kelly MRN: [id number] DOB: 05-Jul-1955 Today's Date: 12/25/2018   History of Present Illness  63 yo male admitted from cancer center on 6/22 for AMS, weakness, N/V, sepsis. Pt with pancreatic cancer with mets to liver. PMH includes skin cancer, kidney stones, biliary stent placement 08/2018, HTN.  Clinical Impression   Pt presents with [facility] strength, balance, and tolerance for activity. Pt with no mobility issues with PT, moves well and is very active at home. Pt is at baseline with mobility, and requires no physical assist for any mobility tasks. PT to sign off, pt with no mobility questions.     Follow Up Recommendations No PT follow up    Equipment Recommendations  None recommended by PT    Recommendations for Other Services       Precautions / Restrictions Precautions Precautions: None Restrictions Weight Bearing Restrictions: No      Mobility  Bed Mobility Overal bed mobility: Modified Independent             General bed mobility comments: increased time, use of HOB elevation for return to supine.  Transfers Overall transfer level: Modified independent               General transfer comment: increased time to rise, use of UE support on bed.  Ambulation/Gait Ambulation/Gait assistance: Independent Gait Distance (Feet): 400 Feet Assistive device: None Gait Pattern/deviations: Step-through pattern;WFL(Within Functional Limits) Gait velocity: WFL   General Gait Details: WFL gait, normal speed with no evidence of imbalance.  Stairs            Wheelchair Mobility    Modified Rankin (Stroke Patients Only)       Balance Overall balance assessment: Independent                                           Pertinent Vitals/Pain      Home Living Family/patient expects to be discharged to:: Private residence Living Arrangements: Spouse/significant  other Available Help at Discharge: Family Type of Home: House Home Access: Stairs to enter Entrance Stairs-Rails: Chemical engineer of Steps: 4 Home Layout: One level Home Equipment: None      Prior Function Level of Independence: Independent         Comments: Pt reports walking 2 miles/day with his dog, states he stays active at home.     Hand Dominance   Dominant Hand: Right    Extremity/Trunk Assessment   Upper Extremity Assessment Upper Extremity Assessment: Overall WFL for tasks assessed    Lower Extremity Assessment Lower Extremity Assessment: Overall WFL for tasks assessed    Cervical / Trunk Assessment Cervical / Trunk Assessment: Normal  Communication   Communication: No difficulties  Cognition Arousal/Alertness: Awake/alert Behavior During Therapy: WFL for tasks assessed/performed Overall Cognitive Status: Within Functional Limits for tasks assessed                                        General Comments      Exercises     Assessment/Plan    PT Assessment Patent does not need any further PT services  PT Problem List         PT Treatment Interventions      PT Goals (Current goals  can be found in the Care Plan section)  Acute Rehab PT Goals Patient Stated Goal: go home tomorrow PT Goal Formulation: With patient Time For Goal Achievement: 12/25/18 Potential to Achieve Goals: Good    Frequency     Barriers to discharge        Co-evaluation               AM-PAC PT "6 Clicks" Mobility  Outcome Measure Help needed turning from your back to your side while in a flat bed without using bedrails?: None Help needed moving from lying on your back to sitting on the side of a flat bed without using bedrails?: None Help needed moving to and from a bed to a chair (including a wheelchair)?: None Help needed standing up from a chair using your arms (e.g., wheelchair or bedside chair)?: None Help needed to walk  in hospital room?: None Help needed climbing 3-5 steps with a railing? : None 6 Click Score: 24    End of Session   Activity Tolerance: Patient tolerated treatment well Patient left: in bed;with call bell/phone within reach Nurse Communication: Mobility status PT Visit Diagnosis: Other abnormalities of gait and mobility (R26.89)    Time: 5465-0354 PT Time Calculation (min) (ACUTE ONLY): 10 min   Charges:   PT Evaluation $PT Eval Low Complexity: 1 Low        Julien Girt, PT Acute Rehabilitation Services Pager [phone]  Office [phone]   Alexiss Iturralde D Elonda Husky 12/25/2018, 2:08 PM

## 2018-12-25 NOTE — Progress Notes (Signed)
PHARMACY - PHYSICIAN COMMUNICATION CRITICAL VALUE ALERT - BLOOD CULTURE IDENTIFICATION (BCID)  Cory Kelly is an 63 y.o. male who presented to [facility] on 12/24/2018 with a chief complaint of sepsis, metastatic pancreatic cancer  Assessment:  1 of 3 bottles with GPC - strep species - can generally be presumed contaminant  Name of physician (or Provider) Contacted: n/a  Current antibiotics: Vanc/Cefepime  Changes to prescribed antibiotics recommended:  No changes  Results for orders placed or performed in visit on 12/24/18  Blood Culture ID Panel (Reflexed) (Collected: 12/24/2018 11:25 AM)  Result Value Ref Range   Enterococcus species NOT DETECTED NOT DETECTED   Listeria monocytogenes NOT DETECTED NOT DETECTED   Staphylococcus species NOT DETECTED NOT DETECTED   Staphylococcus aureus (BCID) NOT DETECTED NOT DETECTED   Streptococcus species DETECTED (A) NOT DETECTED   Streptococcus agalactiae NOT DETECTED NOT DETECTED   Streptococcus pneumoniae NOT DETECTED NOT DETECTED   Streptococcus pyogenes NOT DETECTED NOT DETECTED   Acinetobacter baumannii NOT DETECTED NOT DETECTED   Enterobacteriaceae species NOT DETECTED NOT DETECTED   Enterobacter cloacae complex NOT DETECTED NOT DETECTED   Escherichia coli NOT DETECTED NOT DETECTED   Klebsiella oxytoca NOT DETECTED NOT DETECTED   Klebsiella pneumoniae NOT DETECTED NOT DETECTED   Proteus species NOT DETECTED NOT DETECTED   Serratia marcescens NOT DETECTED NOT DETECTED   Haemophilus influenzae NOT DETECTED NOT DETECTED   Neisseria meningitidis NOT DETECTED NOT DETECTED   Pseudomonas aeruginosa NOT DETECTED NOT DETECTED   Candida albicans NOT DETECTED NOT DETECTED   Candida glabrata NOT DETECTED NOT DETECTED   Candida krusei NOT DETECTED NOT DETECTED   Candida parapsilosis NOT DETECTED NOT DETECTED   Candida tropicalis NOT DETECTED NOT DETECTED    [name] 12/25/2018  8:38 PM

## 2018-12-26 DIAGNOSIS — A419 Sepsis, unspecified organism: Secondary | ICD-10-CM

## 2018-12-26 DIAGNOSIS — N179 Acute kidney failure, unspecified: Secondary | ICD-10-CM

## 2018-12-26 DIAGNOSIS — B955 Unspecified streptococcus as the cause of diseases classified elsewhere: Secondary | ICD-10-CM

## 2018-12-26 DIAGNOSIS — R7881 Bacteremia: Secondary | ICD-10-CM

## 2018-12-26 DIAGNOSIS — R6520 Severe sepsis without septic shock: Secondary | ICD-10-CM

## 2018-12-26 DIAGNOSIS — Z95828 Presence of other vascular implants and grafts: Secondary | ICD-10-CM

## 2018-12-26 DIAGNOSIS — C250 Malignant neoplasm of head of pancreas: Secondary | ICD-10-CM

## 2018-12-26 LAB — BLOOD CULTURE ID PANEL (REFLEXED)

## 2018-12-26 LAB — BASIC METABOLIC PANEL
Anion gap: 5 (ref 5–15)
BUN: 6 mg/dL — ABNORMAL LOW (ref 8–23)
CO2: 26 mmol/L (ref 22–32)
Calcium: 7.8 mg/dL — ABNORMAL LOW (ref 8.9–10.3)
Chloride: 102 mmol/L (ref 98–111)
Creatinine, Ser: 0.72 mg/dL (ref 0.61–1.24)
GFR calc Af Amer: >60 mL/min (ref >60)
GFR calc non Af Amer: >60 mL/min (ref >60)
Glucose, Bld: 112 mg/dL — ABNORMAL HIGH (ref 70–99)
Potassium: 3.2 mmol/L — ABNORMAL LOW (ref 3.5–5.1)
Sodium: 133 mmol/L — ABNORMAL LOW (ref 135–145)

## 2018-12-26 LAB — CBC
HCT: 28.9 % — ABNORMAL LOW (ref 39.0–52.0)
Hemoglobin: 9.3 g/dL — ABNORMAL LOW (ref 13.0–17.0)
MCH: 28.2 pg (ref 26.0–34.0)
MCHC: 32.2 g/dL (ref 30.0–36.0)
MCV: 87.6 fL (ref 80.0–100.0)
Platelets: 78 10*3/uL — ABNORMAL LOW (ref 150–400)
RBC: 3.3 MIL/uL — ABNORMAL LOW (ref 4.22–5.81)
RDW: 14.5 % (ref 11.5–15.5)
WBC: 2.6 10*3/uL — ABNORMAL LOW (ref 4.0–10.5)
nRBC: 0 % (ref 0.0–0.2)

## 2018-12-26 LAB — MAGNESIUM: Magnesium: 1.6 mg/dL — ABNORMAL LOW (ref 1.7–2.4)

## 2018-12-26 LAB — PROCALCITONIN: Procalcitonin: 42.75 ng/mL

## 2018-12-26 MED ORDER — MAGNESIUM SULFATE 2 GM/50ML IV SOLN
2.0000 g | Freq: Once | INTRAVENOUS | Status: AC
  Administered 2018-12-26: 2 g via INTRAVENOUS
  Filled 2018-12-26: qty 50

## 2018-12-26 MED ORDER — ENOXAPARIN SODIUM 40 MG/0.4ML ~~LOC~~ SOLN: 40.0000 mg | SUBCUTANEOUS | Status: DC

## 2018-12-26 MED ORDER — POTASSIUM CHLORIDE 10 MEQ/100ML IV SOLN
10.0000 meq | INTRAVENOUS | Status: AC
  Administered 2018-12-26 (×6): 10 meq via INTRAVENOUS
  Filled 2018-12-26 (×3): qty 100

## 2018-12-26 MED ORDER — SENNOSIDES-DOCUSATE SODIUM 8.6-50 MG PO TABS: 2.0000 | ORAL_TABLET | Freq: Every evening | ORAL | Status: DC | PRN

## 2018-12-26 NOTE — Progress Notes (Addendum)
HEMATOLOGY-ONCOLOGY PROGRESS NOTE  SUBJECTIVE: Remains afebrile. No chills, abdominal pain, nausea/vomiting. Was evaluated for paracentesis on 12/25/2018, but not enough fluid to remove. Wants to go home. Has no other complaints.   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[place])   08/31/2017 - 11/22/2017 Chemotherapy   The patient had palonosetron (ALOXI) injection 0.25 mg, 0.25 mg, Intravenous,  Once, 6 of 6 cycles Administration: 0.25 mg (08/31/2017), 0.25 mg (09/14/2017), 0.25 mg (09/28/2017), 0.25 mg (10/12/2017), 0.25 mg (10/26/2017), 0.25 mg (11/09/2017) pegfilgrastim-cbqv (UDENYCA) injection 6 mg, 6 mg, Subcutaneous, Once, 6 of 6 cycles Administration: 6 mg (09/02/2017), 6 mg (09/16/2017), 6 mg (09/30/2017), 6 mg (10/14/2017), 6 mg (10/28/2017), 6 mg (11/11/2017) irinotecan (CAMPTOSAR) 360 mg in sodium chloride 0.9 % 500 mL chemo infusion, 150 mg/m2 = 360 mg (100 % of original dose 150 mg/m2), Intravenous,  Once, 6 of 6 cycles Dose modification: 150 mg/m2 (original dose 150 mg/m2, Cycle 1, Reason: Provider Judgment) Administration: 360 mg (08/31/2017), 360 mg (09/14/2017), 360 mg (09/28/2017), 340 mg (10/12/2017), 340 mg (10/26/2017), 340 mg (11/09/2017) leucovorin 952 mg in sodium chloride 0.9 % 250 mL infusion, 400 mg/m2 = 952 mg, Intravenous,  Once, 6 of 6 cycles Administration: 952 mg (08/31/2017), 952 mg (09/14/2017), 952 mg (09/28/2017), 888 mg (10/12/2017), 888 mg (10/26/2017), 888 mg (11/09/2017) oxaliplatin (ELOXATIN) 200 mg in dextrose 5 % 500 mL chemo infusion, 85 mg/m2 = 200 mg, Intravenous,  Once, 6 of 6 cycles Dose modification: 65 mg/m2 (original dose 85 mg/m2, Cycle 4, Reason: Provider Judgment) Administration: 200 mg (08/31/2017), 200 mg (09/14/2017), 200 mg (09/28/2017), 145 mg (10/12/2017), 150 mg (10/26/2017), 150 mg (11/09/2017) fosaprepitant (EMEND) 150 mg, dexamethasone (DECADRON) 12 mg in sodium chloride 0.9 % 145 mL  IVPB, , Intravenous,  Once, 6 of 6 cycles Administration:  (08/31/2017),  (09/14/2017),  (09/28/2017),  (10/12/2017),  (10/26/2017),  (11/09/2017) fluorouracil (ADRUCIL) 5,700 mg in sodium chloride 0.9 % 136 mL chemo infusion, 2,400 mg/m2 = 5,700 mg, Intravenous, 1 Day/Dose, 6 of 6 cycles Administration: 5,700 mg (08/31/2017), 5,700 mg (09/14/2017), 5,700 mg (09/28/2017), 5,350 mg (10/12/2017), 5,350 mg (10/26/2017), 5,350 mg (11/09/2017)  for chemotherapy treatment.    12/06/2017 - 02/28/2018 Chemotherapy   The patient had gemcitabine (GEMZAR) 1,786 mg in sodium chloride 0.9 % 250 mL chemo infusion, 800 mg/m2 = 1,786 mg (80 % of original dose 1,000 mg/m2), Intravenous,  Once, 4 of 4 cycles Dose modification: 800 mg/m2 (original dose 1,000 mg/m2, Cycle 1, Reason: Provider Judgment), 800 mg/m2 (original dose 1,000 mg/m2, Cycle 1, Reason: Provider Judgment, Comment: call from MD to decrease dose due to counts) Administration: 1,786 mg (12/20/2017), 1,786 mg (12/06/2017), 1,786 mg (01/03/2018), 1,786 mg (01/17/2018), 1,786 mg (01/31/2018), 1,786 mg (02/14/2018), 1,786 mg (02/28/2018) PACLitaxel-protein bound (ABRAXANE) chemo infusion 225 mg, 100 mg/m2 = 225 mg (80 % of original dose 125 mg/m2), Intravenous, Once, 3 of 3 cycles Dose modification: 100 mg/m2 (original dose 125 mg/m2, Cycle 1, Reason: Provider Judgment), 100 mg/m2 (original dose 125 mg/m2, Cycle 1, Reason: Provider Judgment, Comment: call from MD to decrease dose based on counts) Administration: 225 mg (12/20/2017), 225 mg (12/06/2017), 225 mg (01/03/2018), 225 mg (01/17/2018)  for chemotherapy treatment.    07/04/2018 Cancer Staging   Staging form: Exocrine Pancreas, AJCC 8th Edition - Pathologic: Stage IV (cM1) - Signed by Ladell Pier, MD on 07/04/2018   08/21/2018 -  Chemotherapy   The patient had palonosetron (ALOXI) injection  0.25 mg, 0.25 mg, Intravenous,  Once, 7 of 8 cycles Administration: 0.25 mg (08/21/2018), 0.25 mg (09/03/2018), 0.25 mg (09/18/2018), 0.25 mg  (10/01/2018), 0.25 mg (10/16/2018), 0.25 mg (11/14/2018), 0.25 mg (12/10/2018) pegfilgrastim-cbqv (UDENYCA) injection 6 mg, 6 mg, Subcutaneous, Once, 7 of 8 cycles Administration: 6 mg (08/23/2018), 6 mg (09/05/2018), 6 mg (09/20/2018), 6 mg (10/03/2018), 6 mg (10/18/2018), 6 mg (11/16/2018) irinotecan (CAMPTOSAR) 360 mg in dextrose 5 % 500 mL chemo infusion, 150 mg/m2 = 360 mg (100 % of original dose 150 mg/m2), Intravenous,  Once, 7 of 8 cycles Dose modification: 150 mg/m2 (original dose 150 mg/m2, Cycle 1, Reason: Provider Judgment), 112.5 mg/m2 (original dose 150 mg/m2, Cycle 1, Reason: Change in LFTs), 90 mg/m2 (original dose 150 mg/m2, Cycle 6, Reason: Provider Judgment), 90 mg/m2 (original dose 90 mg/m2, Cycle 7) Administration: 260 mg (08/21/2018), 260 mg (09/03/2018), 260 mg (09/18/2018), 260 mg (10/01/2018), 200 mg (11/14/2018), 200 mg (10/16/2018), 200 mg (12/10/2018) leucovorin 952 mg in dextrose 5 % 250 mL infusion, 400 mg/m2 = 952 mg, Intravenous,  Once, 7 of 8 cycles Dose modification: 300 mg/m2 (original dose 400 mg/m2, Cycle 1, Reason: Provider Judgment), 300 mg/m2 (original dose 300 mg/m2, Cycle 7) Administration: 696 mg (08/21/2018), 696 mg (09/03/2018), 696 mg (09/18/2018), 676 mg (10/01/2018), 676 mg (11/14/2018), 676 mg (10/16/2018), 676 mg (12/10/2018) oxaliplatin (ELOXATIN) 155 mg in dextrose 5 % 500 mL chemo infusion, 65 mg/m2 = 155 mg (100 % of original dose 65 mg/m2), Intravenous,  Once, 7 of 8 cycles Dose modification: 65 mg/m2 (original dose 65 mg/m2, Cycle 1, Reason: Provider Judgment) Administration: 150 mg (08/21/2018), 150 mg (09/03/2018), 150 mg (09/18/2018), 150 mg (10/01/2018), 150 mg (10/16/2018), 150 mg (11/14/2018), 150 mg (12/10/2018) fosaprepitant (EMEND) 150 mg, dexamethasone (DECADRON) 12 mg in sodium chloride 0.9 % 145 mL IVPB, , Intravenous,  Once, 7 of 8 cycles Administration:  (08/21/2018),  (09/03/2018),  (09/18/2018),  (10/01/2018),  (10/16/2018),  (11/14/2018),  (12/10/2018) fluorouracil (ADRUCIL)  5,700 mg in sodium chloride 0.9 % 136 mL chemo infusion, 2,400 mg/m2 = 5,700 mg, Intravenous, 1 Day/Dose, 7 of 8 cycles Dose modification: 1,800 mg/m2 (original dose 2,400 mg/m2, Cycle 1, Reason: Change in LFTs) Administration: 4,200 mg (08/21/2018), 4,200 mg (09/03/2018), 4,200 mg (09/18/2018), 4,050 mg (10/01/2018), 4,050 mg (10/16/2018), 4,050 mg (11/14/2018), 4,050 mg (12/10/2018)  for chemotherapy treatment.       REVIEW OF SYSTEMS:   Constitutional: Fever and chills have resolved. Eyes: Denies blurriness of vision Ears, nose, mouth, throat, and face: Denies mucositis or sore throat Respiratory: Denies cough, dyspnea or wheezes Cardiovascular: Denies palpitation, chest discomfort Gastrointestinal: No abdominal pain, nausea, vomiting, constipation, diarrhea. Skin: Denies abnormal skin rashes Lymphatics: Denies new lymphadenopathy or easy bruising Neurological:Denies numbness, tingling or new weaknesses Behavioral/Psych: Mood is stable, no new changes  Extremities: No lower extremity edema All other systems were reviewed with the patient and are negative.  I have reviewed the past medical history, past surgical history, social history and family history with the patient and they are unchanged from previous note.   PHYSICAL EXAMINATION:  Vitals:   12/25/18 2041 12/26/18 0609  BP: 122/78 122/77  Pulse: 66 62  Resp: 19 17  Temp: 98.9 F (37.2 C) 98.1 F (36.7 C)  SpO2: 97% 99%   Filed Weights   12/24/18 1630  Weight: 195 lb 5.2 oz (88.6 kg)    Intake/Output from previous day: 06/23 0701 - 06/24 0700 In: 1900.9 [P.O.:720; I.V.:730.9; IV Piggyback:450] Out: 1150 [Urine:1150]  GENERAL:alert, no distress and comfortable  SKIN: skin color, texture, turgor are normal, no rashes or significant lesions OROPHARYNX:no exudate, no erythema and lips, buccal mucosa, and tongue normal  NECK: supple, thyroid normal size, non-tender, without nodularity LYMPH:  no palpable lymphadenopathy in  the cervical, axillary or inguinal LUNGS: clear to auscultation and percussion with normal breathing effort HEART: regular rate & rhythm and no murmurs and no lower extremity edema ABDOMEN: Positive bowel sounds.  Minimal ascites noted.  No abdominal pain with palpation. Musculoskeletal:no cyanosis of digits and no clubbing  NEURO: alert & oriented x 3 with fluent speech, no focal motor/sensory deficits  LABORATORY DATA:  I have reviewed the data as listed CMP Latest Ref Rng & Units 12/26/2018 12/25/2018 12/24/2018  Glucose 70 - 99 mg/dL 112(H) 104(H) 132(H)  BUN 8 - 23 mg/dL 6(L) 7(L) 6(L)  Creatinine 0.61 - 1.24 mg/dL 0.72 0.71 0.94  Sodium 135 - 145 mmol/L 133(L) 135 135  Potassium 3.5 - 5.1 mmol/L 3.2(L) 3.6 4.8  Chloride 98 - 111 mmol/L 102 103 100  CO2 22 - 32 mmol/L 26 23 20(L)  Calcium 8.9 - 10.3 mg/dL 7.8(L) 7.9(L) 8.9  Total Protein 6.5 - 8.1 g/dL - - 6.3(L)  Total Bilirubin 0.3 - 1.2 mg/dL - - 0.8  Alkaline Phos 38 - 126 U/L - - 754(H)  AST 15 - 41 U/L - - 40  ALT 0 - 44 U/L - - 73(H)    Lab Results  Component Value Date   WBC 2.6 (L) 12/26/2018   HGB 9.3 (L) 12/26/2018   HCT 28.9 (L) 12/26/2018   MCV 87.6 12/26/2018   PLT 78 (L) 12/26/2018   NEUTROABS 1.6 (L) 12/25/2018    Mr Abdomen W Wo Contrast  Result Date: 12/04/2018 CLINICAL DATA:  Follow-up pancreas cancer. Assess for worsening of disease. EXAM: MRI ABDOMEN WITHOUT AND WITH CONTRAST TECHNIQUE: Multiplanar multisequence MR imaging of the abdomen was performed both before and after the administration of intravenous contrast. CONTRAST:  10 cc Gadavist COMPARISON:  11/06/2018 FINDINGS: Lower chest: Tiny bilateral pleural effusions are identified, similar to previous exam. Hepatobiliary: Multiple liver metastases are again noted. Index lesion within segment 5 measures 1.7 by 1.3 cm, image 44/1003. Previously 1.7 x 1.5 cm. The index lesion within segment 2 measures 1.0 by 0.8 cm, image 29/1003. Previously 1.2 x 0.8 cm.  New lesion within segment 4 a measures 1 by 0.9 cm, image 31/1002. The remainder of the liver metastasis are relatively stable in the interval. Gallbladder unremarkable. Common bile duct stent in place without intrahepatic bile duct dilatation. Pancreas: The soft tissue mass within the head of pancreas measures approximately 3.7 x 4.1 cm, image 59/1001. Previously 3.5 x 3.1 cm. Diffuse atrophy and pancreatic ductal dilatation is again noted within the body and tail. Encasement and obstruction of the portal venous confluence is again identified with distal reconstitution of the portal vein via collaterals. Spleen: Splenomegaly is again noted. The spleen has a cranial caudal dimension 20.1 cm. Similar. Adrenals/Urinary Tract: Normal appearance of the adrenal glands. No kidney mass or hydronephrosis. Simple appearing inferior pole right kidney cyst measures 2.7 cm. Stomach/Bowel: Visualized portions within the abdomen are unremarkable. Vascular/Lymphatic: No evidence for abdominal aortic aneurysm. Esophageal and splenic varices as well as portosystemic venous collaterals compatible with portal venous hypertension. No adenopathy identified. Other:  Interval increase in volume of abdominal ascites. Musculoskeletal: No suspicious bone lesions identified. IMPRESSION: 1. There is been mild increase in size of mass involving head of pancreas. 2. Diffuse hepatic metastasis. The  previous index lesions are unchanged in size compared with previous exam. A single new metastasis is identified within the medial segment of left lobe of liver. 3. 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 Abdominal ascites has increased in the interval. Electronically Signed   By: Kerby Moors M.D.   On: 12/04/2018 11:27   Ct Abdomen Pelvis W Contrast  Result Date: 12/24/2018 CLINICAL DATA:  Abdominal distension, fever, history of pancreatic cancer EXAM: CT ABDOMEN AND PELVIS WITH CONTRAST TECHNIQUE:  Multidetector CT imaging of the abdomen and pelvis was performed using the standard protocol following bolus administration of intravenous contrast. CONTRAST:  177m OMNIPAQUE IOHEXOL 300 MG/ML  SOLN COMPARISON:  MR abdomen pelvis, 12/04/2018, 10/30/2018 FINDINGS: Lower chest: No acute abnormality. Hepatobiliary: Known liver metastases are poorly appreciated by single-phase CT, for example in the dome of the liver (series 2, image 14). No gallstones or gallbladder wall thickening. Common bile duct stent is positioned tip in the descending portion of the duodenum with post stenting pneumobilia. Pancreas: Discrete pancreatic head mass is not well appreciated, primarily appreciated by mass effect on the confluence of the portal vein (series 2, image 38. There is atrophy of the pancreatic body and tail and dilatation pancreatic duct. Spleen: Splenomegaly. Adrenals/Urinary Tract: Adrenal glands are unremarkable. Nonobstructive bilateral nephrolithiasis. Bladder is unremarkable. Stomach/Bowel: Stomach is within normal limits. Appendix is not clearly visualized. No evidence of bowel wall thickening, distention, or inflammatory changes. Large burden of stool in the distal colon and rectum. Vascular/Lymphatic: Aortic atherosclerosis. No enlarged abdominal or pelvic lymph nodes. Reproductive: No mass or other significant abnormality. Other: No abdominal wall hernia or abnormality. Moderate volume ascites. Musculoskeletal: No acute or significant osseous findings. IMPRESSION: 1. Redemonstrated pancreatic malignancy with hepatic metastases, status post common bile duct stenting. Primary mass and hepatic metastases are better appreciated by recent multiphasic MRI. 2.  Moderate volume ascites, which is similar to prior MRI. 3.  Splenomegaly. 4.  Large burden of stool in the distal colon and rectum. 5.  Nonobstructive bilateral nephrolithiasis. Electronically Signed   By: AEddie CandleM.D.   On: 12/24/2018 14:35   Mr 3d Recon At  Scanner  Result Date: 12/04/2018 CLINICAL DATA:  Follow-up pancreas cancer. Assess for worsening of disease. EXAM: MRI ABDOMEN WITHOUT AND WITH CONTRAST TECHNIQUE: Multiplanar multisequence MR imaging of the abdomen was performed both before and after the administration of intravenous contrast. CONTRAST:  10 cc Gadavist COMPARISON:  11/06/2018 FINDINGS: Lower chest: Tiny bilateral pleural effusions are identified, similar to previous exam. Hepatobiliary: Multiple liver metastases are again noted. Index lesion within segment 5 measures 1.7 by 1.3 cm, image 44/1003. Previously 1.7 x 1.5 cm. The index lesion within segment 2 measures 1.0 by 0.8 cm, image 29/1003. Previously 1.2 x 0.8 cm. New lesion within segment 4 a measures 1 by 0.9 cm, image 31/1002. The remainder of the liver metastasis are relatively stable in the interval. Gallbladder unremarkable. Common bile duct stent in place without intrahepatic bile duct dilatation. Pancreas: The soft tissue mass within the head of pancreas measures approximately 3.7 x 4.1 cm, image 59/1001. Previously 3.5 x 3.1 cm. Diffuse atrophy and pancreatic ductal dilatation is again noted within the body and tail. Encasement and obstruction of the portal venous confluence is again identified with distal reconstitution of the portal vein via collaterals. Spleen: Splenomegaly is again noted. The spleen has a cranial caudal dimension 20.1 cm. Similar. Adrenals/Urinary Tract: Normal appearance of the adrenal glands. No kidney mass or hydronephrosis. Simple appearing  inferior pole right kidney cyst measures 2.7 cm. Stomach/Bowel: Visualized portions within the abdomen are unremarkable. Vascular/Lymphatic: No evidence for abdominal aortic aneurysm. Esophageal and splenic varices as well as portosystemic venous collaterals compatible with portal venous hypertension. No adenopathy identified. Other:  Interval increase in volume of abdominal ascites. Musculoskeletal: No suspicious bone  lesions identified. IMPRESSION: 1. There is been mild increase in size of mass involving head of pancreas. 2. Diffuse hepatic metastasis. The previous index lesions are unchanged in size compared with previous exam. A single new metastasis is identified within the medial segment of left lobe of liver. 3. 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 Abdominal ascites has increased in the interval. Electronically Signed   By: Kerby Moors M.D.   On: 12/04/2018 11:27   Dg Chest Portable 1 View  Result Date: 12/24/2018 CLINICAL DATA:  Fever and weakness. EXAM: PORTABLE CHEST 1 VIEW COMPARISON:  11/04/2018 FINDINGS: The cardiac silhouette, mediastinal and hilar contours are stable. Mild cardiac enlargement and mild tortuosity of the thoracic aorta. The right IJ power port is stable. The lungs are clear. No pleural effusions. The bony thorax is intact. IMPRESSION: No acute cardiopulmonary findings. Electronically Signed   By: Marijo Sanes M.D.   On: 12/24/2018 13:59   Dg Ercp  Result Date: 12/06/2018 CLINICAL DATA:  63 year old male with a history of pancreatic cancer and obstructed jaundice with prior stent placement and progressive liver enzyme elevation. EXAM: ERCP TECHNIQUE: Multiple spot images obtained with the fluoroscopic device and submitted for interpretation post-procedure. FLUOROSCOPY TIME:  Fluoroscopy Time:  3 minutes 43 seconds Radiation Exposure Index (if provided by the fluoroscopic device): 66.7 mGy COMPARISON:  MRCP 12/04/2018 FINDINGS: A total of 4 intraoperative saved images are submitted for review. The images demonstrate a metal stent in the common hepatic and common bile ducts with a flexible endoscope in the descending duodenum. Subsequent images demonstrate wire cannulation of the stent and balloon dilation. IMPRESSION: ERCP with cannulation of the previously placed metal stent and balloon dilatation. These images were submitted for radiologic  interpretation only. Please see the procedural report for the amount of contrast and the fluoroscopy time utilized. Electronically Signed   By: Jacqulynn Cadet M.D.   On: 12/06/2018 11:51   Ir Abdomen US Limited  Result Date: 11/29/2018 CLINICAL DATA:  Metastatic pancreas adenocarcinoma, abdominal distension assess for ascites EXAM: LIMITED ABDOMEN ULTRASOUND FOR ASCITES TECHNIQUE: Limited ultrasound survey for ascites was performed in all four abdominal quadrants. COMPARISON:  None. FINDINGS: Ultrasound survey of the abdominal 4 quadrants demonstrates trace amount of lower abdominopelvic ascites. Not enough to warrant therapeutic paracentesis. Procedure not performed. IMPRESSION: Trace amount of abdominopelvic ascites by ultrasound. Procedure not performed. Electronically Signed   By: Jerilynn Mages.  Shick M.D.   On: 11/29/2018 13:25   [place] Ascites (abdomen Limited)  Result Date: 12/25/2018 CLINICAL DATA:  63 year old male with abdominal distension, ascites EXAM: LIMITED ABDOMEN ULTRASOUND FOR ASCITES TECHNIQUE: Limited ultrasound survey for ascites was performed in all four abdominal quadrants. COMPARISON:  Prior CT scan of the abdomen and pelvis 12/24/2018 FINDINGS: Sonographic interrogation was performed of the 4 quadrants of the abdomen. There is trace perihepatic fluid in the right upper quadrant with multiple small loops of bowel. Overall, fluid volume is low. No safe window for paracentesis. IMPRESSION: Small volume ascites, insufficient for paracentesis. Electronically Signed   By: Jacqulynn Cadet M.D.   On: 12/25/2018 12:13    ASSESSMENT AND PLAN: 1. Pancreas head mass  CT abdomen/pelvis 08/03/2017-fullness in  the head of the pancreas with stranding in the peripancreatic fat and pancreatic ductal dilatation.   MRI abdomen 08/04/2017-findings favored to represent acute on chronic pancreatitis; equivocal soft tissue fullness within the head and uncinate process of the pancreas; indeterminate too small to  characterize right hepatic lobe lesion; small volume abdominal ascites; splenomegaly.   CA-19-9 elevated at 977on 08/04/2017.  Status postupper EUS 08/10/2017-findings of an irregular masslike region in the pancreatic head measuring approximately 2.7 cm. This directly abutted the main portal vein but no other major vascular structures. Biopsies obtained with preliminary cytology positive for malignancy, likely adenocarcinoma. The final report is pending. The common bile duct and main pancreatic duct were both dilated. ERCP was then proceeded with forstent placement. Multiple attempts were made tocannulate the bile duct without success. The procedure was aborted.  08/16/2017 ERCP with placement of a metal biliary stent in the common bile duct by Dr. Francella Solian at [facility].  Cycle 1 FOLFIRINOX 08/31/2017  Cycle 2 FOLFIRINOX 09/14/2017  Cycle 3 FOLFIRINOX 09/28/2017  Cycle 4 FOLFIRINOX 10/12/2017 (oxaliplatin dose reduced secondary to thrombocytopenia)  Cycle 5 FOLFIRINOX 10/26/2017  Cycle 6 FOLFIRINOX 11/09/2017  [facility] CT[facility] at [facility] 11/29/2017-no definitive evidence of distant metastatic disease. 2 subcentimeter liver lesions described on prior MRI not visualized on CT. Ill-defined pancreatic head mass stable to decreased in size measuring 2.1 x 2 cm. Peripancreatic inflammatory stranding decreased from prior. No biliary ductal dilatation. Celiac axis less than 180 degrees abutment. Common hepatic artery with greater than 180 degrees encasement; superior mesenteric artery with short segment less than 180 degrees abutment; portal vein/superior mesenteric vein with greater than 180 degrees encasement of the extrahepatic portal vein with associated circumferential narrowing at the portomesenteric, overall substantially improved from prior examination. Now less than 180 degree abutment of the SMV. The portal vein and SMV remain patent. Splenic vein patent.  Cycle 1 gemcitabine/Abraxane  12/06/2017  Cycle 2 gemcitabine/Abraxane 12/20/2017  Cycle 3 gemcitabine/Abraxane 01/03/2018  Cycle 4 gemcitabine/Abraxane 01/17/2018  Cycle 5 gemcitabine 01/31/2018 (Abraxane held due to neuropathy)  Cycle 6 gemcitabine 02/14/2018 (Abraxane held due to neuropathy)  Cycle 7 gemcitabine 02/28/2018 (Abraxane held due to neuropathy)  CT chest/abdomen/pelvis at [facility] 03/08/2018-stable appearance of previously identified infiltrating pancreatic head mass. No CT evidence of metastatic disease.  SBRT to the pancreas 04/05/2018-04/11/2018  05/09/2018 CA-19-9 2138   MRI abdomen 05/09/2018-similar 6 mm hypoenhancing focus posterior right lobe liver; more superior lesion near the dome of the liver not identified on the postcontrast imaging but there is a persistent focus of diffusion signal abnormality in this region; new 8 mm hypoenhancing focus located just superior to the gallbladder; additional tiny focus of diffusion abnormality located more superiorly without obvious associated abnormal enhancement. Ill-defined pancreatic head mass not felt to be significantly changed.  CT chest/abdomen/pelvis 05/10/2018-ill-defined hypoattenuating pancreatic head mass and ill-defined soft tissue abutting the celiac axis with mildly increased dilatation of the main pancreatic duct. New soft tissue nodule along the right upper lobe bronchus; hypoattenuating liver lesions concerning for metastasis.  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 The stent traverses the pancreatic mass and extends into the duodenum. Increase in size of 2 liver lesions. Pancreatic mass appears  similar. Indistinct tissue planes  around the duodenum along with wall thickening in the gallbladder and especially the gallbladder neck.  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 Diffuse hepatic metastasis. Previous index lesions unchanged in size. Single new metastasis identified within the medial segment left lobe of liver. 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 Abdominal ascites increased in the interval.  Cycle 7 FOLFIRINOX6/02/2019 2. Hypertension 3. Port-A-Cath placement 08/30/2017 4. History of elevated  bilirubin-questionGilbert's syndrome 5. Mild leukopenia, thrombocytopenia-question secondary to portal hypertension/splenomegaly;09/14/2017 white count improved, platelet count stable;progressive thrombocytopenia following gemcitabine/Abraxane 6. Kidney stones 7. Fever following cycles 2 and 3 gemcitabine/Abraxane,? Fever related to gemcitabine 8.Jaundice, elevated liver enzymes and bilirubin 06/22/2018; status post ERCP 06/25/2018 with debris removed from the existing stent and a new plastic stent placed. Recurrent jaundice 07/19/2018 status post ERCP 07/20/2018 with finding that the previously placed plastic stent had migrated distally. There was debris and sludge on the stent. The previously placed stent was removed. 5 to 8 mm stricture just proximal to the existing SEMS. 2 plastic stents were placed.ERCP 08/16/2018-2 plastic biliary stents were removed from within the previously placed partially covered SEMS; stricture confirmed just proximal to the previously placed SEMS, treated with placement of an 8 cm long 10 mm diameter uncovered SEMS in good position. 9.Hospitalized with bacteremia Streptococcus group C 11/04/2018 through 11/08/2018. IV ceftriaxone through 11/17/2018. TEE 11/08/2018 negative. 10.Elevated LFTs 12/03/2018-MRI abdomen 12/04/2018 with mild increase in size of mass involving head of the pancreas. Diffuse hepatic metastasis. Previous index lesions unchanged in size. Single new metastasis identified within the medial segment left lobe of liver. 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 Abdominal ascites increased in the interval. 12/06/2018 ERCP-previously placed uncovered 8 cm long metal biliary stent nearly filled with bio debris and flecks of tissue ingrowth. Stent cleared. 11. 12/24/18 -admission with sepsis syndrome, blood cultures positive for Streptococcus species  Mr. Franta appears improved.  Blood cultures positive for strep species  in only the anaerobic bottle drawn from [facility]. He also had strep in [facility] from 11/03/2018. Sensitivities pending. CBC from today shows mild pancytopenia likely due to recent chemotherapy - this is improving.  1.  Recommend ID consult as he has grown strep in his BC twice within the past 2 months.  2.  Await BC sensitivities. Continue antibiotics per hospitalist. 3.  Monitor daily  CBC.  Will consider a packed red blood cell transfusion if his hemoglobin is less than 7.0 or a platelet transfusion if his platelet count is less than 10,000 or active bleeding.  No transfusion is indicated today.  Check WBC differential   LOS: 2 days   Mikey Bussing, DNP, AGPCNP-BC, AOCNP 12/26/18 Mr. Sequeira was interviewed and examined.  1 blood culture from hospital admission is positive for a Streptococcus species.  The source of infection is unclear, but he has a history of recurrent Streptococcus bacteremia.  The infection may be from the biliary tract, though his liver enzymes are improved.  There is no clinical evidence of a Port-A-Cath infection, but this is possible.  I recommend an infectious disease consult to recommend a course of antibiotics.  I agree with removing the Port-A-Cath if recommended by Dr. Johnnye Sima.  He will need IV access for home antibiotics and continuation of chemotherapy.  We could place a PICC after several days of antibiotics if the infectious disease service feels this is appropriate.

## 2018-12-26 NOTE — Consult Note (Addendum)
Consultation  Referring Provider: [facility]/ Dr Reesa Chew Primary Care Physician:  Mancel Bale, PA-C Primary Gastroenterologist:  CoryJacobs  Reason for Consultation: Streptococcus bacteremia  HPI: Cory Kelly is a 63 y.o. male, known to GI was diagnosed with pancreatic cancer in the spring 2019.  He has been managed aggressively, with chemotherapy and underwent SBRT to the pancreas in October 2019.  He has since been found to have hepatic mets.  Course has been complicated by biliary obstruction.  He did have ERCP in February 2020 with removal of 2 previously placed plastic stents from within the lumen of the covered SEMS he was noted to have a stricture just proximal to the previously placed SEMS and a new 8 cm / 10 mm uncovered SEMS was placed. He was admitted in May 2020 diagnosed with a Streptococcus bacteremia 1 out of 6 .He had work-up with TEE etc. with no source found At that time there is nothing felt to suggest biliary obstruction liver tests were not significantly elevated so no repeat ERCP was undertaken.[name] Kitchen  He was treated with a course of ceftriaxone, then he restarted chemotherapy. MRI of the abdomen was done on 12/04/2018 which showed multiple liver metastases tiny bilateral effusions, and mild increase in the size of the pancreatic head mass.  There were stigmata of portal venous hypertension with splenomegaly and varices and increased abdominal ascites He was noted to have a significant rise in LFTs, and therefore underwent repeat ERCP with Cory Kelly on 12/06/2018.  The previous stent was found to be in good position but was filled with debris and possible ingrowth and there was significant narrowing of the lumen of the SCM throughout.  The stent was cleared with balloon sweeping with subsequent cholangiogram showing stent to be patent, with good flow. Patient presented to the [facility] on 12/24/2018 noted to be very weak, his family said he had been confused at home recently had been  having intermittent fever and chills.  He had been placed on empiric Cipro when he had declined admission a few days previous.  Decision was to admit him.  Work-up since admission with negative chest x-ray, negative urine.  Repeat CT of the abdomen and pelvis on 12/24/2018 shows multiple liver mets and a common bile duct stent with the tip in the descending duodenum, moderate ascites. He had ultrasound yesterday with intention to tap the ascites but not enough fluid present for tap. Blood cultures have again grown Streptococcus, and per the hospitalist conversation with ID -GI source suspected question of colonoscopy was raised.  Also felt that his Port-A-Cath may need to be replaced.   Labs today WBC 2.6, hemoglobin 9.3 platelets 78 T bili 0.8/alk phos 754/ALT 73/AST 240-much improved over 3 weeks ago prior to last ERCP.  Patient says he is feeling better today, stronger, has not had any "shivers", and has been afebrile.  He denies any nausea or abdominal pain currently.  He says he had been getting nauseated and would sometimes vomit when he would have an episode of shivering at home.   Past Medical History:  Diagnosis Date  . Abdominal pain    due to bloating  . Bloating   . Cancer ([place])    skin cancer  . Essential hypertension   . Fatigue   . History of kidney stones    passed  . History of weight loss   . HOH (hard of hearing)    no hearing aids  . Neuralgia   . Pancreatic  cancer ([place])   . Stress    loss of father in Dec 2018.    Past Surgical History:  Procedure Laterality Date  . APPENDECTOMY    . BILIARY STENT PLACEMENT N/A 06/25/2018   Procedure: BILIARY STENT PLACEMENT;  Surgeon: Rush Landmark Telford Nab., MD;  Location: [facility];  Service: Gastroenterology;  Laterality: N/A;  . BILIARY STENT PLACEMENT  07/20/2018   Procedure: BILIARY STENT PLACEMENT;  Surgeon: Milus Banister, MD;  Location: [facility] ENDOSCOPY;  Service: Gastroenterology;;  . BILIARY STENT PLACEMENT N/A  08/16/2018   Procedure: BILIARY STENT PLACEMENT;  Surgeon: Milus Banister, MD;  Location: [facility] ENDOSCOPY;  Service: Endoscopy;  Laterality: N/A;  . ENDOSCOPIC RETROGRADE CHOLANGIOPANCREATOGRAPHY (ERCP) WITH PROPOFOL N/A 08/10/2017   Procedure: ENDOSCOPIC RETROGRADE CHOLANGIOPANCREATOGRAPHY (ERCP) WITH PROPOFOL;  Surgeon: Milus Banister, MD;  Location: [facility] ENDOSCOPY;  Service: Endoscopy;  Laterality: N/A;  . ENDOSCOPIC RETROGRADE CHOLANGIOPANCREATOGRAPHY (ERCP) WITH PROPOFOL N/A 07/20/2018   Procedure: ENDOSCOPIC RETROGRADE CHOLANGIOPANCREATOGRAPHY (ERCP) WITH PROPOFOL;  Surgeon: Milus Banister, MD;  Location: [facility] ENDOSCOPY;  Service: Gastroenterology;  Laterality: N/A;  Balloon Sweeps  . ENDOSCOPIC RETROGRADE CHOLANGIOPANCREATOGRAPHY (ERCP) WITH PROPOFOL N/A 08/16/2018   Procedure: ENDOSCOPIC RETROGRADE CHOLANGIOPANCREATOGRAPHY (ERCP) WITH PROPOFOL;  Surgeon: Milus Banister, MD;  Location: [facility] ENDOSCOPY;  Service: Endoscopy;  Laterality: N/A;  . ENDOSCOPIC RETROGRADE CHOLANGIOPANCREATOGRAPHY (ERCP) WITH PROPOFOL N/A 12/06/2018   Procedure: ENDOSCOPIC RETROGRADE CHOLANGIOPANCREATOGRAPHY (ERCP) WITH PROPOFOL;  Surgeon: Milus Banister, MD;  Location: [facility] ENDOSCOPY;  Service: Endoscopy;  Laterality: N/A;  cbd stent sweep  . ERCP N/A 06/25/2018   Procedure: ENDOSCOPIC RETROGRADE CHOLANGIOPANCREATOGRAPHY (ERCP);  Surgeon: Irving Copas., MD;  Location: [facility];  Service: Gastroenterology;  Laterality: N/A;  . EUS N/A 08/10/2017   Procedure: UPPER ENDOSCOPIC ULTRASOUND (EUS) RADIAL;  Surgeon: Milus Banister, MD;  Location: [facility] ENDOSCOPY;  Service: Endoscopy;  Laterality: N/A;  . EYE SURGERY     lasik/left eye  . IR FLUORO GUIDE PORT INSERTION RIGHT  08/30/2017  . IR US GUIDE VASC ACCESS RIGHT  08/30/2017  . REMOVAL OF STONES  06/25/2018   Procedure: REMOVAL OF STONES;  Surgeon: Rush Landmark Telford Nab., MD;  Location: [facility] ENDOSCOPY;  Service: Gastroenterology;;  . SKIN CANCER EXCISION    . STENT  REMOVAL  07/20/2018   Procedure: STENT REMOVAL;  Surgeon: Milus Banister, MD;  Location: [facility] ENDOSCOPY;  Service: Gastroenterology;;  . Lavell Islam REMOVAL  08/16/2018   Procedure: STENT REMOVAL;  Surgeon: Milus Banister, MD;  Location: [facility] ENDOSCOPY;  Service: Endoscopy;;  . TEE WITHOUT CARDIOVERSION N/A 11/08/2018   Procedure: TRANSESOPHAGEAL ECHOCARDIOGRAM (TEE);  Surgeon: Acie Fredrickson Wonda Cheng, MD;  Location: [facility] ENDOSCOPY;  Service: Cardiovascular;  Laterality: N/A;    Prior to Admission medications   Medication Sig Start Date End Date Taking? Authorizing Provider  amLODipine (NORVASC) 10 MG tablet Take 10 mg by mouth at bedtime.  05/21/18  Yes [provider]  ciprofloxacin (CIPRO) 500 MG tablet Take 1 tablet (500 mg total) by mouth 2 (two) times daily. 12/17/18  Yes Milton Ferguson, MD  doxazosin (CARDURA) 1 MG tablet Take 1 mg by mouth at bedtime.   Yes [provider]  gabapentin (NEURONTIN) 100 MG capsule Take 2 capsules (200 mg total) by mouth 2 (two) times daily. 12/03/18  Yes Owens Shark, NP  lidocaine-prilocaine (EMLA) cream Apply 1 application topically as needed. Apply to portacath site 1-2 hours prior to use Patient taking differently: Apply 1 application topically daily as needed (1-2 hours prior to portacath  access).  08/20/18  Yes Owens Shark, NP  loperamide (IMODIUM A-D) 2 MG tablet Take 1 tablet (2 mg total) by mouth as directed. Patient taking differently: Take 2-4 mg by mouth 3 (three) times daily as needed for diarrhea or loose stools.  08/22/18  Yes Ladell Pier, MD  olmesartan (BENICAR) 40 MG tablet Take 1 tablet (40 mg total) by mouth daily. 04/04/18  Yes Weber, Sarah L, PA-C  ondansetron (ZOFRAN) 4 MG tablet Take 1 tablet (4 mg total) by mouth every 6 (six) hours as needed for nausea. 06/25/18  Yes Emokpae, Courage, MD  oxyCODONE-acetaminophen (PERCOCET) 10-325 MG tablet Take 1 tablet by mouth every 4 (four) hours as needed for pain. 12/03/18  Yes Owens Shark,  NP  potassium chloride SA (K-DUR,KLOR-CON) 20 MEQ tablet Take 1 tablet (20 mEq total) by mouth 2 (two) times daily. 10/16/18  Yes Ladell Pier, MD  [facility] ACETATE EX Apply 1 application topically daily as needed (itching).   Yes [provider]  prochlorperazine (COMPAZINE) 10 MG tablet Take 10 mg by mouth every 6 (six) hours as needed for nausea or vomiting.   Yes [provider]  ondansetron (ZOFRAN ODT) 4 MG disintegrating tablet '4mg'$  ODT q4 hours prn nausea/vomit Patient not taking: Reported on 12/24/2018 12/17/18   Milton Ferguson, MD    Current Facility-Administered Medications  Medication Dose Route Frequency Provider Last Rate Last Dose  . 0.9 %  sodium chloride infusion   Intravenous Continuous Shelly Coss, MD 100 mL/hr at 12/26/18 0427    . acetaminophen (TYLENOL) tablet 650 mg  650 mg Oral Q6H PRN Shelly Coss, MD   650 mg at 12/25/18 1138  . alum & mag hydroxide-simeth (MAALOX/MYLANTA) 200-200-20 MG/5ML suspension 30 mL  30 mL Oral Q4H PRN Amin, Ankit Chirag, MD      . amLODipine (NORVASC) tablet 10 mg  10 mg Oral QHS Amin, Ankit Chirag, MD   10 mg at 12/25/18 2122  . ceFEPIme (MAXIPIME) 2 g in sodium chloride 0.9 % 100 mL IVPB  2 g Intravenous Q8H Pham, Anh P, RPH 200 mL/hr at 12/26/18 1300 2 g at 12/26/18 1300  . enoxaparin (LOVENOX) injection 40 mg  40 mg Subcutaneous Q24H Shelly Coss, MD   Stopped at 12/25/18 2124  . gabapentin (NEURONTIN) capsule 200 mg  200 mg Oral BID Amin, Ankit Chirag, MD   200 mg at 12/26/18 1026  . guaiFENesin-dextromethorphan (ROBITUSSIN DM) 100-10 MG/5ML syrup 5 mL  5 mL Oral Q4H PRN Amin, Ankit Chirag, MD      . hydrALAZINE (APRESOLINE) injection 10 mg  10 mg Intravenous Q4H PRN Amin, Ankit Chirag, MD      . hydrocortisone (ANUSOL-HC) 2.5 % rectal cream 1 application  1 application Topical QID PRN Amin, Ankit Chirag, MD      . hydrocortisone cream 1 % 1 application  1 application Topical TID PRN Amin, Ankit Chirag, MD       . irbesartan (AVAPRO) tablet 37.5 mg  37.5 mg Oral Daily Amin, Ankit Chirag, MD   37.5 mg at 12/26/18 1026  . lip balm (CARMEX) ointment 1 application  1 application Topical PRN Amin, Ankit Chirag, MD      . loratadine (CLARITIN) tablet 10 mg  10 mg Oral Daily PRN Amin, Ankit Chirag, MD      . Muscle Rub CREA 1 application  1 application Topical PRN Amin, Ankit Chirag, MD      . ondansetron (ZOFRAN) injection 4 mg  4 mg Intravenous Q6H PRN Shelly Coss, MD      . oxyCODONE-acetaminophen (PERCOCET/ROXICET) 5-325 MG per tablet 1 tablet  1 tablet Oral Q6H PRN Shelly Coss, MD   1 tablet at 12/25/18 1901   And  . oxyCODONE (Oxy IR/ROXICODONE) immediate release tablet 5 mg  5 mg Oral Q6H PRN Shelly Coss, MD   5 mg at 12/24/18 2014  . phenol (CHLORASEPTIC) mouth spray 1 spray  1 spray Mouth/Throat PRN Amin, Ankit Chirag, MD      . polyethylene glycol (MIRALAX / GLYCOLAX) packet 17 g  17 g Oral Daily Adhikari, Amrit, MD      . polyethylene glycol (MIRALAX / GLYCOLAX) packet 17 g  17 g Oral Daily PRN Amin, Ankit Chirag, MD      . polyvinyl alcohol (LIQUIFILM TEARS) 1.4 % ophthalmic solution 1 drop  1 drop Both Eyes PRN Amin, Ankit Chirag, MD      . potassium chloride 10 mEq in 100 mL IVPB  10 mEq Intravenous Q1 Hr x 6 Amin, Ankit Chirag, MD 100 mL/hr at 12/26/18 1230 10 mEq at 12/26/18 1230  . senna-docusate (Senokot-S) tablet 2 tablet  2 tablet Oral QHS PRN Amin, Ankit Chirag, MD      . sodium chloride (OCEAN) 0.65 % nasal spray 1 spray  1 spray Each Nare PRN Amin, Ankit Chirag, MD      . tamsulosin (FLOMAX) capsule 0.4 mg  0.4 mg Oral QPC supper Amin, Ankit Chirag, MD      . vancomycin (VANCOCIN) 1,250 mg in sodium chloride 0.9 % 250 mL IVPB  1,250 mg Intravenous Q12H Pham, Anh P, RPH 166.7 mL/hr at 12/26/18 1126 1,250 mg at 12/26/18 1126    Allergies as of 12/24/2018  . (No Known Allergies)    Family History  Problem Relation Age of Onset  . Hyperlipidemia Father   . Heart disease  Father   . Dementia Father   . Memory loss Mother   . Colon cancer Neg Hx   . Esophageal cancer Neg Hx   . Inflammatory bowel disease Neg Hx   . Liver disease Neg Hx   . Rectal cancer Neg Hx   . Stomach cancer Neg Hx   . Pancreatic cancer Neg Hx     Social History   Socioeconomic History  . Marital status: Married    Spouse name: Not on file  . Number of children: Not on file  . Years of education: Not on file  . Highest education level: Not on file  Occupational History    Employer: [employer] Needs  . Financial resource strain: Not on file  . Food insecurity    Worry: Not on file    Inability: Not on file  . Transportation needs    Medical: Not on file    Non-medical: Not on file  Tobacco Use  . Smoking status: Never Smoker  . Smokeless tobacco: Never Used  Substance and Sexual Activity  . Alcohol use: No  . Drug use: No  . Sexual activity: Yes    Partners: Female    Birth control/protection: None  Lifestyle  . Physical activity    Days per week: Not on file    Minutes per session: Not on file  . Stress: Not on file  Relationships  . Social Herbalist on phone: Not on file    Gets together: Not on file    Attends religious service: Not on file  Active member of club or organization: Not on file    Attends meetings of clubs or organizations: Not on file    Relationship status: Not on file  . Intimate partner violence    Fear of current or ex partner: Not on file    Emotionally abused: Not on file    Physically abused: Not on file    Forced sexual activity: Not on file  Other Topics Concern  . Not on file  Social History Narrative   Married   2 children, 2 grand children   Coaches womens basketball at [employer]: Gen: Denies any fever, chills, sweats, anorexia, fatigue, weakness, malaise, weight loss, and sleep disorder CV: Denies chest pain, angina, palpitations, syncope, orthopnea, PND,  peripheral edema, and claudication. Resp: Denies dyspnea at rest, dyspnea with exercise, cough, sputum, wheezing, coughing up blood, and pleurisy. GI: Denies vomiting blood, jaundice, and fecal incontinence.   Denies dysphagia or odynophagia. GU : Denies urinary burning, blood in urine, urinary frequency, urinary hesitancy, nocturnal urination, and urinary incontinence. MS: Denies joint pain, limitation of movement, and swelling, stiffness, low back pain, extremity pain. Denies muscle weakness, cramps, atrophy.  Derm: Denies rash, itching, dry skin, hives, moles, warts, or unhealing ulcers.  Psych: Denies depression, anxiety, memory loss, suicidal ideation, hallucinations, paranoia, and confusion. Heme: Denies bruising, bleeding, and enlarged lymph nodes. Neuro:  Denies any headaches, dizziness, paresthesias. Endo:  Denies any problems with DM, thyroid, adrenal function.  Physical Exam: Vital signs in last 24 hours: Temp:  [98.1 F (36.7 C)-98.9 F (37.2 C)] 98.1 F (36.7 C) (06/24 0609) Pulse Rate:  [62-66] 62 (06/24 0609) Resp:  [17-19] 17 (06/24 0609) BP: (122)/(77-78) 122/77 (06/24 0609) SpO2:  [97 %-99 %] 99 % (06/24 0609) Last BM Date: 12/25/18 General:   Alert,  Well-developed, thin, chronically ill appearing white male pleasant and cooperative in NAD Head:  Normocephalic and atraumatic. Eyes:  Sclera clear, no icterus.   Conjunctiva pink. Ears:  Normal auditory acuity. Nose:  No deformity, discharge,  or lesions. Mouth:  No deformity or lesions.   Neck:  Supple; no masses or thyromegaly. Lungs:  Clear throughout to auscultation.   No wheezes, crackles, or rhonchi. Heart:  Regular rate and rhythm; no murmurs, clicks, rubs,  or gallops. Abdomen:  Soft,nontender, BS active,nonpalp mass or hsm.   Rectal:  Deferred  Msk:  Symmetrical without gross deformities. . Pulses:  Normal pulses noted. Extremities:  Without clubbing or edema. Neurologic:  Alert and  oriented x4;  grossly  normal neurologically. Skin:  Intact without significant lesions or rashes.. Psych:  Alert and cooperative. Normal mood and affect.  Intake/Output from previous day: 06/23 0701 - 06/24 0700 In: 1900.9 [P.O.:720; I.V.:730.9; IV Piggyback:450] Out: 1150 [Urine:1150] Intake/Output this shift: No intake/output data recorded.  Lab Results: Recent Labs    12/24/18 1123 12/25/18 0725 12/26/18 0555  WBC 9.3 2.3* 2.6*  HGB 12.0* 9.2* 9.3*  HCT 37.0* 28.4* 28.9*  PLT 169 60* 78*   BMET Recent Labs    12/24/18 1205 12/25/18 0725 12/26/18 0555  NA 135 135 133*  K 4.8 3.6 3.2*  CL 100 103 102  CO2 20* 23 26  GLUCOSE 132* 104* 112*  BUN 6* 7* 6*  CREATININE 0.94 0.71 0.72  CALCIUM 8.9 7.9* 7.8*   LFT Recent Labs    12/24/18 1205  PROT 6.3*  ALBUMIN 3.0*  AST 40  ALT 73*  ALKPHOS 754*  BILITOT 0.8  IMPRESSION:  #71 63 year old white male with metastatic pancreatic cancer with multiple hepatic mets, who is admitted with recurrent sepsis.  Patient had been having intermittent fever and chills at home for over a week but had previously declined admission.  He had been placed on oral Cipro.  Blood cultures are positive for Streptococcus-same organism he had about a month ago. Work-up this admission has been negative as to source.  This is in the setting of ongoing chemotherapy  Very likely he has biliary source known indwelling uncovered stent.  Most recent ERCP on 12/06/2018 showed the uncovered metal stent to be filled with debris and possibly ingrowth.  There is diffuse narrowing of the lumen.  The stent was cleaned out with balloon sweeping with subsequent imaging showing good flow through the stent. At that time he had been noted to have a significant rise in LFTs.  No evidence at this time that the stent is occluded  #2 Pancytopenia   Plan; Do not feel colonoscopy indicated.  Discussed case with Cory Kelly Will defer question regarding replacing Port-A-Cath to  medicine/oncology. We will reserve further ERCPs for obvious stent occlusion/jaundice  Await sensitivities.  Patient expresses interest in being set up for home IV antibiotics if possible. Repeat labs in a.m.    Amy EsterwoodPA-C  12/26/2018, 3:24 PM     Attending physician's note   I have taken an interval history, reviewed the chart and examined the patient. I agree with the Advanced Practitioner's note, impression and recommendations.   Discussed with Cory Kelly  Stage IV pancreatic cancer with liver mets, s/p rpt ERCP 6/4 patent stent after removal of debris. Now with streptococcus bacteremia. Doubt GI source. Patent stent/no ascending cholangitis/no colonic symptoms/no colonic lesions noted in multiple CTs.  Scheduled for port removal followed by PICC line insertion.  Hold off on any GI intervention at the present time.  Cory Kelly follows him closely. His wife is in constant touch with him as well.  Please call with any questions or problems.   Carmell Austria, MD [facility] [phone].

## 2018-12-26 NOTE — Progress Notes (Signed)
PROGRESS NOTE    Cory Kelly  BTD:[id number] DOB: 1955-07-13 DOA: 12/24/2018 PCP: Mancel Bale, PA-C   Brief Narrative:  63 year old with history of pancreatic cancer with hepatic metastases status post biliary stent, essential hypertension, BPH was sent from the cancer center for evaluation of fevers and chills which had been ongoing for about a week.  Apparently there was also some signs of confusion and was on Cipro outpatient but because of no resolution of the symptoms he was recommended to come to the hospital.  Outpatient culture data growing strep per [facility] ID.   Assessment & Plan:   Principal Problem:   Sepsis ([place]) Active Problems:   Essential hypertension   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[place])   Port-A-Cath in place  Sepsis of unknown etiology, present on admission Ascites, malignant versus infectious -Chest x-ray is negative.  CT of the abdomen pelvis is negative.  UA is overall unimpressive.  Outpatient culture data growing strep per [facility] ID.  Second bacteremia with strep in last 2 months.  Spoke with Dr. Johnnye Sima from infectious disease who recommends evaluation by GI for possible colonoscopy and removal of port. -Unable to get paracentesis. -Procalcitonin significantly elevated at 42  Right chest wall Port-A-Cath -No obvious evidence of infection but will need to be removed due to recurrent bacteremia.  History of pancreatic cancer status post metastases - Per oncology team.  Large volume stool, constipation -Bowel regimen  Nonobstructive bilateral kidney stones -IV fluids, now on Flomax.  Peripheral neuropathy - On home gabapentin.  Essential hypertension -Resume home meds.  DVT prophylaxis: Lovenox Code Status: Full code Family Communication: None at bedside Disposition Plan: TBD  Consultants:   Oncology  Will Call GI Consult- [place]  Procedures:   Unable to perform paracentesis  Antimicrobials:   Vancomycin day 3  Cefepime day  3   Subjective: Overall he feels better does not have any complaints.  Review of Systems Otherwise negative except as per HPI, including: General = no fevers, chills, dizziness, malaise, fatigue HEENT/EYES = negative for pain, redness, loss of vision, double vision, blurred vision, loss of hearing, sore throat, hoarseness, dysphagia Cardiovascular= negative for chest pain, palpitation, murmurs, lower extremity swelling Respiratory/lungs= negative for shortness of breath, cough, hemoptysis, wheezing, mucus production Gastrointestinal= negative for nausea, vomiting,, abdominal pain, melena, hematemesis Genitourinary= negative for Dysuria, Hematuria, Change in Urinary Frequency MSK = Negative for arthralgia, myalgias, Back Pain, Joint swelling  Neurology= Negative for headache, seizures, numbness, tingling  Psychiatry= Negative for anxiety, depression, suicidal and homocidal ideation Allergy/Immunology= Medication/Food allergy as listed  Skin= Negative for Rash, lesions, ulcers, itching   Objective: Vitals:   12/25/18 0500 12/25/18 1419 12/25/18 2041 12/26/18 0609  BP: 128/74 113/76 122/78 122/77  Pulse: (!) 58 61 66 62  Resp: 18 18 19 17   Temp: 98 F (36.7 C) 98 F (36.7 C) 98.9 F (37.2 C) 98.1 F (36.7 C)  TempSrc: Oral Oral Oral Oral  SpO2: 100% 98% 97% 99%  Weight:      Height:        Intake/Output Summary (Last 24 hours) at 12/26/2018 1312 Last data filed at 12/26/2018 0500 Gross per 24 hour  Intake 502.16 ml  Output 900 ml  Net -397.84 ml   Filed Weights   12/24/18 1630  Weight: 88.6 kg    Examination:  Constitutional: NAD, calm, comfortable Eyes: PERRL, lids and conjunctivae normal ENMT: Mucous membranes are moist. Posterior pharynx clear of any exudate or lesions.Normal dentition.  Neck: normal, supple,  no masses, no thyromegaly Respiratory: clear to auscultation bilaterally, no wheezing, no crackles. Normal respiratory effort. No accessory muscle use.   Cardiovascular: Regular rate and rhythm, no murmurs / rubs / gallops. No extremity edema. 2+ pedal pulses. No carotid bruits.  Abdomen: no tenderness, no masses palpated. No hepatosplenomegaly. Bowel sounds positive.  Musculoskeletal: no clubbing / cyanosis. No joint deformity upper and lower extremities. Good ROM, no contractures. Normal muscle tone.  Skin: no rashes, lesions, ulcers. No induration Neurologic: CN 2-12 grossly intact. Sensation intact, DTR normal. Strength 5/5 in all 4.  Psychiatric: Normal judgment and insight. Alert and oriented x 3. Normal mood.   Right chest wall Port-A-Cath noted without any evidence of infection.   Data Reviewed:   CBC: Recent Labs  Lab 12/24/18 1123 12/25/18 0725 12/26/18 0555  WBC 9.3 2.3* 2.6*  NEUTROABS 8.2* 1.6*  --   HGB 12.0* 9.2* 9.3*  HCT 37.0* 28.4* 28.9*  MCV 87.5 87.9 87.6  PLT 169 60* 78*   Basic Metabolic Panel: Recent Labs  Lab 12/24/18 1205 12/25/18 0725 12/26/18 0555  NA 135 135 133*  K 4.8 3.6 3.2*  CL 100 103 102  CO2 20* 23 26  GLUCOSE 132* 104* 112*  BUN 6* 7* 6*  CREATININE 0.94 0.71 0.72  CALCIUM 8.9 7.9* 7.8*  MG  --   --  1.6*   GFR: Estimated Creatinine Clearance: 121.5 mL/min (by C-G formula based on SCr of 0.72 mg/dL). Liver Function Tests: Recent Labs  Lab 12/24/18 1205  AST 40  ALT 73*  ALKPHOS 754*  BILITOT 0.8  PROT 6.3*  ALBUMIN 3.0*   Recent Labs  Lab 12/24/18 1328  LIPASE 24   No results for input(s): AMMONIA in the last 168 hours. Coagulation Profile: No results for input(s): INR, PROTIME in the last 168 hours. Cardiac Enzymes: No results for input(s): CKTOTAL, CKMB, CKMBINDEX, TROPONINI in the last 168 hours. BNP (last 3 results) No results for input(s): PROBNP in the last 8760 hours. HbA1C: No results for input(s): HGBA1C in the last 72 hours. CBG: No results for input(s): GLUCAP in the last 168 hours. Lipid Profile: No results for input(s): CHOL, HDL, LDLCALC, TRIG,  CHOLHDL, LDLDIRECT in the last 72 hours. Thyroid Function Tests: No results for input(s): TSH, T4TOTAL, FREET4, T3FREE, THYROIDAB in the last 72 hours. Anemia Panel: No results for input(s): VITAMINB12, FOLATE, FERRITIN, TIBC, IRON, RETICCTPCT in the last 72 hours. Sepsis Labs: Recent Labs  Lab 12/24/18 1328 12/26/18 0555  PROCALCITON  --  42.75  LATICACIDVEN 1.7  --     Recent Results (from the past 240 hour(s))  Culture, Blood     Status: None (Preliminary result)   Collection Time: 12/24/18 11:25 AM   Specimen: BLOOD RIGHT ARM  Result Value Ref Range Status   Specimen Description   Final    BLOOD RIGHT ARM Performed at [facility], [place] [address], [place], [place] [address]    Special Requests   Final    BOTTLES DRAWN AEROBIC AND ANAEROBIC Blood Culture adequate volume   Culture  Setup Time   Final    AEROBIC BOTTLE ONLY GRAM POSITIVE COCCI Organism ID to follow CRITICAL RESULT CALLED TO, READ BACK BY AND VERIFIED WITH: J LEGGE PHARMD 12/25/18 2030 JDW GRAM VARIABLE ROD ANAEROBIC BOTTLE ONLY CRITICAL RESULT CALLED TO, READ BACK BY AND VERIFIED WITH: Damian Leavell [phone] Performed at [facility], [place] [address], [place], [place] [address]  Culture GRAM POSITIVE COCCI  Final   Report Status PENDING  Incomplete  Blood Culture ID Panel (Reflexed)     Status: Abnormal   Collection Time: 12/24/18 11:25 AM  Result Value Ref Range Status   Enterococcus species NOT DETECTED NOT DETECTED Final   Listeria monocytogenes NOT DETECTED NOT DETECTED Final   Staphylococcus species NOT DETECTED NOT DETECTED Final   Staphylococcus aureus (BCID) NOT DETECTED NOT DETECTED Final   Streptococcus species DETECTED (A) NOT DETECTED Final    Comment: Not Enterococcus species, Streptococcus agalactiae, Streptococcus pyogenes, or Streptococcus pneumoniae. CRITICAL RESULT CALLED TO, READ BACK BY AND VERIFIED WITH: J LEGGE PHARMD 12/25/18 2030 JDW     Streptococcus agalactiae NOT DETECTED NOT DETECTED Final   Streptococcus pneumoniae NOT DETECTED NOT DETECTED Final   Streptococcus pyogenes NOT DETECTED NOT DETECTED Final   Acinetobacter baumannii NOT DETECTED NOT DETECTED Final   Enterobacteriaceae species NOT DETECTED NOT DETECTED Final   Enterobacter cloacae complex NOT DETECTED NOT DETECTED Final   Escherichia coli NOT DETECTED NOT DETECTED Final   Klebsiella oxytoca NOT DETECTED NOT DETECTED Final   Klebsiella pneumoniae NOT DETECTED NOT DETECTED Final   Proteus species NOT DETECTED NOT DETECTED Final   Serratia marcescens NOT DETECTED NOT DETECTED Final   Haemophilus influenzae NOT DETECTED NOT DETECTED Final   Neisseria meningitidis NOT DETECTED NOT DETECTED Final   Pseudomonas aeruginosa NOT DETECTED NOT DETECTED Final   Candida albicans NOT DETECTED NOT DETECTED Final   Candida glabrata NOT DETECTED NOT DETECTED Final   Candida krusei NOT DETECTED NOT DETECTED Final   Candida parapsilosis NOT DETECTED NOT DETECTED Final   Candida tropicalis NOT DETECTED NOT DETECTED Final    Comment: Performed at [facility], [place] [address], [place], [place] [address]  Culture, Blood     Status: None (Preliminary result)   Collection Time: 12/24/18 12:09 PM   Specimen: Porta Cath; Blood  Result Value Ref Range Status   Specimen Description   Final    PORTA CATH Performed at [facility], [place] [address] [place], [place] [address]    Special Requests   Final    NONE Performed at [facility], [place] [address] [place], [place] [address]    Culture   Final    NO GROWTH < 24 HOURS Performed at [place] [address], [place], [place] [address]    Report Status PENDING  Incomplete  Blood Culture ID Panel (Reflexed)     Status: None   Collection Time: 12/24/18 12:27 PM  Result Value Ref Range Status   Enterococcus species NOT DETECTED NOT DETECTED Final   Listeria  monocytogenes NOT DETECTED NOT DETECTED Final   Staphylococcus species NOT DETECTED NOT DETECTED Final   Staphylococcus aureus (BCID) NOT DETECTED NOT DETECTED Final   Streptococcus species NOT DETECTED NOT DETECTED Final   Streptococcus agalactiae NOT DETECTED NOT DETECTED Final   Streptococcus pneumoniae NOT DETECTED NOT DETECTED Final   Streptococcus pyogenes NOT DETECTED NOT DETECTED Final   Acinetobacter baumannii NOT DETECTED NOT DETECTED Final   Enterobacteriaceae species NOT DETECTED NOT DETECTED Final   Enterobacter cloacae complex NOT DETECTED NOT DETECTED Final   Escherichia coli NOT DETECTED NOT DETECTED Final   Klebsiella oxytoca NOT DETECTED NOT DETECTED Final   Klebsiella pneumoniae NOT DETECTED NOT DETECTED Final   Proteus species NOT DETECTED NOT DETECTED Final   Serratia marcescens NOT DETECTED NOT DETECTED Final   Haemophilus influenzae NOT  DETECTED NOT DETECTED Final   Neisseria meningitidis NOT DETECTED NOT DETECTED Final   Pseudomonas aeruginosa NOT DETECTED NOT DETECTED Final   Candida albicans NOT DETECTED NOT DETECTED Final   Candida glabrata NOT DETECTED NOT DETECTED Final   Candida krusei NOT DETECTED NOT DETECTED Final   Candida parapsilosis NOT DETECTED NOT DETECTED Final   Candida tropicalis NOT DETECTED NOT DETECTED Final    Comment: Performed at [facility], [place] [address], [place], [place] [address]  Urine culture     Status: None   Collection Time: 12/24/18  1:28 PM   Specimen: Urine, Clean Catch  Result Value Ref Range Status   Specimen Description   Final    URINE, CLEAN CATCH Performed at [facility], [place] [address], [place], [place] [address]    Special Requests   Final    NONE Performed at [facility], [place] [address], [place], [place] [address]    Culture   Final    NO GROWTH Performed at [facility], [place] [address], [place], [place] [address]    Report Status 12/25/2018 FINAL  Final    SARS Coronavirus 2 (CEPHEID - Performed in [place] hospital lab), Hosp Order     Status: None   Collection Time: 12/24/18  1:28 PM   Specimen: Nasopharyngeal Swab  Result Value Ref Range Status   SARS Coronavirus 2 NEGATIVE NEGATIVE Final    Comment: (NOTE) If result is NEGATIVE SARS-CoV-2 target nucleic acids are NOT DETECTED. The SARS-CoV-2 RNA is generally detectable in upper and lower  respiratory specimens during the acute phase of infection. The lowest  concentration of SARS-CoV-2 viral copies this assay can detect is 250  copies / mL. A negative result does not preclude SARS-CoV-2 infection  and should not be used as the sole basis for treatment or other  patient management decisions.  A negative result may occur with  improper specimen collection / handling, submission of specimen other  than nasopharyngeal swab, presence of viral mutation(s) within the  areas targeted by this assay, and inadequate number of viral copies  (<250 copies / mL). A negative result must be combined with clinical  observations, patient history, and epidemiological information. If result is POSITIVE SARS-CoV-2 target nucleic acids are DETECTED. The SARS-CoV-2 RNA is generally detectable in upper and lower  respiratory specimens dur ing the acute phase of infection.  Positive  results are indicative of active infection with SARS-CoV-2.  Clinical  correlation with patient history and other diagnostic information is  necessary to determine patient infection status.  Positive results do  not rule out bacterial infection or co-infection with other viruses. If result is PRESUMPTIVE POSTIVE SARS-CoV-2 nucleic acids MAY BE PRESENT.   A presumptive positive result was obtained on the submitted specimen  and confirmed on repeat testing.  While 2019 novel coronavirus  (SARS-CoV-2) nucleic acids may be present in the submitted sample  additional confirmatory testing may be necessary for epidemiological   and / or clinical management purposes  to differentiate between  SARS-CoV-2 and other Sarbecovirus currently known to infect humans.  If clinically indicated additional testing with an alternate test  methodology [phone]) is advised. The SARS-CoV-2 RNA is generally  detectable in upper and lower respiratory sp ecimens during the acute  phase of infection. The expected result is Negative. Fact Sheet for Patients:  StrictlyIdeas.no Fact Sheet for Healthcare Providers: BankingDealers.co.za This test is not yet approved or cleared by the [place]  States FDA and has been authorized for detection and/or diagnosis of SARS-CoV-2 by FDA under an Emergency Use Authorization (EUA).  This EUA will remain in effect (meaning this test can be used) for the duration of the COVID-19 declaration under Section 564(b)(1) of the Act, 21 U.S.C. section 360bbb-3(b)(1), unless the authorization is terminated or revoked sooner. Performed at [facility], [place] [address] [place], [place] [address]   Culture, blood (routine x 2)     Status: None (Preliminary result)   Collection Time: 12/24/18  5:22 PM   Specimen: Left Antecubital; Blood  Result Value Ref Range Status   Specimen Description   Final    LEFT ANTECUBITAL Performed at [place] [address], [place], [place] [address]    Special Requests   Final    BOTTLES DRAWN AEROBIC AND ANAEROBIC Blood Culture results may not be optimal due to an excessive volume of blood received in culture bottles Performed at [place] [address] [place], [place] [address]    Culture   Final    NO GROWTH < 12 HOURS Performed at [place] [address] [place], [place] [address]    Report Status PENDING  Incomplete  Culture, blood (routine x 2)     Status: None (Preliminary result)   Collection Time: 12/24/18  5:29 PM   Specimen: Left  Antecubital; Blood  Result Value Ref Range Status   Specimen Description   Final    RIGHT ANTECUBITAL Performed at [place] [address] [place], [place] [address]    Special Requests   Final    BOTTLES DRAWN AEROBIC AND ANAEROBIC Blood Culture adequate volume Performed at [place] [address], [place], [place] [address]    Culture   Final    NO GROWTH < 12 HOURS Performed at [place] [address] [place],  [address]    Report Status PENDING  Incomplete         Radiology Studies: Ct Abdomen Pelvis W Contrast  Result Date: 12/24/2018 CLINICAL DATA:  Abdominal distension, fever, history of pancreatic cancer EXAM: CT ABDOMEN AND PELVIS WITH CONTRAST TECHNIQUE: Multidetector CT imaging of the abdomen and pelvis was performed using the standard protocol following bolus administration of intravenous contrast. CONTRAST:  166mL OMNIPAQUE IOHEXOL 300 MG/ML  SOLN COMPARISON:  MR abdomen pelvis, 12/04/2018, 10/30/2018 FINDINGS: Lower chest: No acute abnormality. Hepatobiliary: Known liver metastases are poorly appreciated by single-phase CT, for example in the dome of the liver (series 2, image 14). No gallstones or gallbladder wall thickening. Common bile duct stent is positioned tip in the descending portion of the duodenum with post stenting pneumobilia. Pancreas: Discrete pancreatic head mass is not well appreciated, primarily appreciated by mass effect on the confluence of the portal vein (series 2, image 38. There is atrophy of the pancreatic body and tail and dilatation pancreatic duct. Spleen: Splenomegaly. Adrenals/Urinary Tract: Adrenal glands are unremarkable. Nonobstructive bilateral nephrolithiasis. Bladder is unremarkable. Stomach/Bowel: Stomach is within normal limits. Appendix is not clearly visualized. No evidence of bowel wall thickening, distention, or inflammatory changes. Large burden of stool in the distal  colon and rectum. Vascular/Lymphatic: Aortic atherosclerosis. No enlarged abdominal or pelvic lymph nodes. Reproductive: No mass or other significant abnormality. Other: No abdominal wall hernia or abnormality. Moderate volume ascites. Musculoskeletal: No acute or significant osseous findings. IMPRESSION: 1. Redemonstrated pancreatic malignancy with hepatic metastases, status post common bile duct stenting.  Primary mass and hepatic metastases are better appreciated by recent multiphasic MRI. 2.  Moderate volume ascites, which is similar to prior MRI. 3.  Splenomegaly. 4.  Large burden of stool in the distal colon and rectum. 5.  Nonobstructive bilateral nephrolithiasis. Electronically Signed   By: Eddie Candle M.D.   On: 12/24/2018 14:35   Dg Chest Portable 1 View  Result Date: 12/24/2018 CLINICAL DATA:  Fever and weakness. EXAM: PORTABLE CHEST 1 VIEW COMPARISON:  11/04/2018 FINDINGS: The cardiac silhouette, mediastinal and hilar contours are stable. Mild cardiac enlargement and mild tortuosity of the thoracic aorta. The right IJ power port is stable. The lungs are clear. No pleural effusions. The bony thorax is intact. IMPRESSION: No acute cardiopulmonary findings. Electronically Signed   By: Marijo Sanes M.D.   On: 12/24/2018 13:59   [place] Ascites (abdomen Limited)  Result Date: 12/25/2018 CLINICAL DATA:  63 year old male with abdominal distension, ascites EXAM: LIMITED ABDOMEN ULTRASOUND FOR ASCITES TECHNIQUE: Limited ultrasound survey for ascites was performed in all four abdominal quadrants. COMPARISON:  Prior CT scan of the abdomen and pelvis 12/24/2018 FINDINGS: Sonographic interrogation was performed of the 4 quadrants of the abdomen. There is trace perihepatic fluid in the right upper quadrant with multiple small loops of bowel. Overall, fluid volume is low. No safe window for paracentesis. IMPRESSION: Small volume ascites, insufficient for paracentesis. Electronically Signed   By: Jacqulynn Cadet  M.D.   On: 12/25/2018 12:13        Scheduled Meds:  amLODipine  10 mg Oral QHS   enoxaparin (LOVENOX) injection  40 mg Subcutaneous Q24H   gabapentin  200 mg Oral BID   irbesartan  37.5 mg Oral Daily   polyethylene glycol  17 g Oral Daily   tamsulosin  0.4 mg Oral QPC supper   Continuous Infusions:  sodium chloride 100 mL/hr at 12/26/18 0427   ceFEPime (MAXIPIME) IV 2 g (12/26/18 0427)   potassium chloride 10 mEq (12/26/18 1030)   vancomycin 1,250 mg (12/26/18 1126)     LOS: 2 days   Time spent= 35 mins    Damiah Mcdonald Arsenio Loader, MD [facility]  If 7PM-7AM, please contact night-coverage [email or web address] 12/26/2018, 1:12 PM

## 2018-12-26 NOTE — Progress Notes (Signed)
PHARMACY - PHYSICIAN COMMUNICATION CRITICAL VALUE ALERT - BLOOD CULTURE IDENTIFICATION (BCID)  Cory Kelly is an 63 y.o. male who presented to [facility] on 12/24/2018 with Kelly chief complaint of fever and AMS  Assessment:  GVR in blood (suspect contam)  Name of physician (or Provider) ContactedReesa Chew  Current antibiotics: vanc/cefepime  Changes to prescribed antibiotics recommended: continue current abx Patient is on recommended antibiotics - No changes needed  Results for orders placed or performed during the hospital encounter of 12/24/18  Blood Culture ID Panel (Reflexed) (Collected: 12/24/2018 12:27 PM)  Result Value Ref Range   Enterococcus species NOT DETECTED NOT DETECTED   Listeria monocytogenes NOT DETECTED NOT DETECTED   Staphylococcus species NOT DETECTED NOT DETECTED   Staphylococcus aureus (BCID) NOT DETECTED NOT DETECTED   Streptococcus species NOT DETECTED NOT DETECTED   Streptococcus agalactiae NOT DETECTED NOT DETECTED   Streptococcus pneumoniae NOT DETECTED NOT DETECTED   Streptococcus pyogenes NOT DETECTED NOT DETECTED   Acinetobacter baumannii NOT DETECTED NOT DETECTED   Enterobacteriaceae species NOT DETECTED NOT DETECTED   Enterobacter cloacae complex NOT DETECTED NOT DETECTED   Escherichia coli NOT DETECTED NOT DETECTED   Klebsiella oxytoca NOT DETECTED NOT DETECTED   Klebsiella pneumoniae NOT DETECTED NOT DETECTED   Proteus species NOT DETECTED NOT DETECTED   Serratia marcescens NOT DETECTED NOT DETECTED   Haemophilus influenzae NOT DETECTED NOT DETECTED   Neisseria meningitidis NOT DETECTED NOT DETECTED   Pseudomonas aeruginosa NOT DETECTED NOT DETECTED   Candida albicans NOT DETECTED NOT DETECTED   Candida glabrata NOT DETECTED NOT DETECTED   Candida krusei NOT DETECTED NOT DETECTED   Candida parapsilosis NOT DETECTED NOT DETECTED   Candida tropicalis NOT DETECTED NOT DETECTED    Cory Kelly 12/26/2018  10:36 AM

## 2018-12-27 ENCOUNTER — Inpatient Hospital Stay (HOSPITAL_COMMUNITY): Payer: BC Managed Care – PPO

## 2018-12-27 ENCOUNTER — Encounter (HOSPITAL_COMMUNITY): Payer: Self-pay | Admitting: Interventional Radiology

## 2018-12-27 HISTORY — PX: IR REMOVAL TUN ACCESS W/ PORT W/O FL MOD SED: IMG2290

## 2018-12-27 LAB — BLOOD CULTURE ID PANEL (REFLEXED)

## 2018-12-27 LAB — CBC WITH DIFFERENTIAL/PLATELET
Abs Immature Granulocytes: 0.02 10*3/uL (ref 0.00–0.07)
Basophils Absolute: 0 10*3/uL (ref 0.0–0.1)
Basophils Relative: 0 %
Eosinophils Absolute: 0 10*3/uL (ref 0.0–0.5)
Eosinophils Relative: 0 %
HCT: 27.8 % — ABNORMAL LOW (ref 39.0–52.0)
Hemoglobin: 9.2 g/dL — ABNORMAL LOW (ref 13.0–17.0)
Immature Granulocytes: 1 %
Lymphocytes Relative: 10 %
Lymphs Abs: 0.2 10*3/uL — ABNORMAL LOW (ref 0.7–4.0)
MCH: 28.5 pg (ref 26.0–34.0)
MCHC: 33.1 g/dL (ref 30.0–36.0)
MCV: 86.1 fL (ref 80.0–100.0)
Monocytes Absolute: 0.2 10*3/uL (ref 0.1–1.0)
Monocytes Relative: 11 %
Neutro Abs: 1.7 10*3/uL (ref 1.7–7.7)
Neutrophils Relative %: 78 %
Platelets: 90 10*3/uL — ABNORMAL LOW (ref 150–400)
RBC: 3.23 MIL/uL — ABNORMAL LOW (ref 4.22–5.81)
RDW: 14.5 % (ref 11.5–15.5)
WBC: 2.2 10*3/uL — ABNORMAL LOW (ref 4.0–10.5)
nRBC: 0 % (ref 0.0–0.2)

## 2018-12-27 LAB — BASIC METABOLIC PANEL
Anion gap: 7 (ref 5–15)
BUN: 5 mg/dL — ABNORMAL LOW (ref 8–23)
CO2: 23 mmol/L (ref 22–32)
Calcium: 7.7 mg/dL — ABNORMAL LOW (ref 8.9–10.3)
Chloride: 105 mmol/L (ref 98–111)
Creatinine, Ser: 0.68 mg/dL (ref 0.61–1.24)
GFR calc Af Amer: >60 mL/min (ref >60)
GFR calc non Af Amer: >60 mL/min (ref >60)
Glucose, Bld: 114 mg/dL — ABNORMAL HIGH (ref 70–99)
Potassium: 3.4 mmol/L — ABNORMAL LOW (ref 3.5–5.1)
Sodium: 135 mmol/L (ref 135–145)

## 2018-12-27 LAB — HEPATIC FUNCTION PANEL
ALT: 56 U/L — ABNORMAL HIGH (ref 0–44)
AST: 28 U/L (ref 15–41)
Albumin: 2.5 g/dL — ABNORMAL LOW (ref 3.5–5.0)
Alkaline Phosphatase: 531 U/L — ABNORMAL HIGH (ref 38–126)
Bilirubin, Direct: 0.1 mg/dL (ref 0.0–0.2)
Indirect Bilirubin: 0.3 mg/dL (ref 0.3–0.9)
Total Bilirubin: 0.4 mg/dL (ref 0.3–1.2)
Total Protein: 4.9 g/dL — ABNORMAL LOW (ref 6.5–8.1)

## 2018-12-27 LAB — MAGNESIUM: Magnesium: 2 mg/dL (ref 1.7–2.4)

## 2018-12-27 LAB — PROCALCITONIN: Procalcitonin: 23.49 ng/mL

## 2018-12-27 MED ORDER — HEPARIN SOD (PORK) LOCK FLUSH 100 UNIT/ML IV SOLN
INTRAVENOUS | Status: AC | PRN
  Administered 2018-12-27: 500 [IU]

## 2018-12-27 MED ORDER — MIDAZOLAM HCL 2 MG/2ML IJ SOLN
INTRAMUSCULAR | Status: AC | PRN
  Administered 2018-12-27 (×4): 1 mg via INTRAVENOUS

## 2018-12-27 MED ORDER — LIDOCAINE HCL 1 % IJ SOLN
INTRAMUSCULAR | Status: AC
  Filled 2018-12-27: qty 20

## 2018-12-27 MED ORDER — FENTANYL CITRATE (PF) 100 MCG/2ML IJ SOLN
INTRAMUSCULAR | Status: AC | PRN
  Administered 2018-12-27 (×2): 50 ug via INTRAVENOUS

## 2018-12-27 MED ORDER — LIDOCAINE-EPINEPHRINE (PF) 2 %-1:200000 IJ SOLN
INTRAMUSCULAR | Status: AC
  Filled 2018-12-27: qty 20

## 2018-12-27 MED ORDER — NALOXONE HCL 0.4 MG/ML IJ SOLN
INTRAMUSCULAR | Status: AC
  Filled 2018-12-27: qty 1

## 2018-12-27 MED ORDER — TAMSULOSIN HCL 0.4 MG PO CAPS: 0.4000 mg | ORAL_CAPSULE | Freq: Every day | ORAL | 3 refills | Status: AC

## 2018-12-27 MED ORDER — CEFTRIAXONE IV (FOR PTA / DISCHARGE USE ONLY): 2.0000 g | INTRAVENOUS | 0 refills | Status: DC

## 2018-12-27 MED ORDER — LIDOCAINE-EPINEPHRINE 2 %-1:100000 IJ SOLN
INTRAMUSCULAR | Status: AC | PRN
  Administered 2018-12-27: 5 mL

## 2018-12-27 MED ORDER — FENTANYL CITRATE (PF) 100 MCG/2ML IJ SOLN
INTRAMUSCULAR | Status: AC
  Filled 2018-12-27: qty 4

## 2018-12-27 MED ORDER — FLUMAZENIL 0.5 MG/5ML IV SOLN
INTRAVENOUS | Status: AC
  Filled 2018-12-27: qty 5

## 2018-12-27 MED ORDER — HEPARIN SOD (PORK) LOCK FLUSH 100 UNIT/ML IV SOLN
INTRAVENOUS | Status: AC
  Filled 2018-12-27: qty 5

## 2018-12-27 MED ORDER — MIDAZOLAM HCL 2 MG/2ML IJ SOLN
INTRAMUSCULAR | Status: AC
  Filled 2018-12-27: qty 4

## 2018-12-27 MED ORDER — SODIUM CHLORIDE 0.9 % IV SOLN
2.0000 g | INTRAVENOUS | Status: DC
  Administered 2018-12-27: 2 g via INTRAVENOUS
  Filled 2018-12-27: qty 2

## 2018-12-27 NOTE — TOC Initial Note (Addendum)
Transition of Care [facility]) - Initial/Assessment Note    Patient Details  Name: Cory Kelly MRN: [id number] Date of Birth: 01-12-1956  Transition of Care [facility]) CM/SW Contact:    Nila Nephew, LCSW Phone Number: [phone] 12/27/2018, 10:20 AM  Clinical Narrative: pt admitted with sepsis- bacteremia with strep- pt is also [facility] patient. Pt planning to get PICC line placed today in order to DC home to continue course of IV antibiotics (01/06/19 end date). [facility] liaison working with pt to arrange home infusion. CSW will follow to assist with needs.                   Expected Discharge Plan: [place] Barriers to Discharge: (PICC placement, home IV antibiotics)   Patient Goals and CMS Choice Patient states their goals for this hospitalization and ongoing recovery are:: ready to go home CMS Medicare.gov Compare Post Acute Care list provided to:: Patient    Expected Discharge Plan and Services Expected Discharge Plan: [place] In-house Referral: Clinical Social Work Discharge Planning Services: CM Consult   Living arrangements for the past 2 months: [place] Expected Discharge Date: 12/27/18                         HH Arranged: IV Antibiotics HH Agency: [facility] Date HH Agency Contacted: 12/27/18 Time HH Agency Contacted: [phone]    Prior Living Arrangements/Services Living arrangements for the past 2 months: [place] Lives with:: Spouse Patient language and need for interpreter reviewed:: No Do you feel safe going back to the place where you live?: Yes      Need for Family Participation in Patient Care: Yes (Comment)(wife) Care giver support system in place?: Yes (comment)(wife)   Criminal Activity/Legal Involvement Pertinent to Current Situation/Hospitalization: No - Comment as needed  Activities of Daily Living Home Assistive Devices/Equipment: None ADL Screening (condition at time of admission) Patient's  cognitive ability adequate to safely complete daily activities?: No Is the patient deaf or have difficulty hearing?: No Does the patient have difficulty seeing, even when wearing glasses/contacts?: No Does the patient have difficulty concentrating, remembering, or making decisions?: Yes Patient able to express need for assistance with ADLs?: Yes Does the patient have difficulty dressing or bathing?: Yes Independently performs ADLs?: No Communication: Independent Dressing (OT): Needs assistance Is this a change from baseline?: Change from baseline, expected to last >3 days Grooming: Needs assistance Is this a change from baseline?: Change from baseline, expected to last >3 days Feeding: Needs assistance Is this a change from baseline?: Change from baseline, expected to last >3 days Bathing: Needs assistance Is this a change from baseline?: Change from baseline, expected to last >3 days Toileting: Needs assistance Is this a change from baseline?: Change from baseline, expected to last >3days In/Out Bed: Needs assistance Is this a change from baseline?: Change from baseline, expected to last >3 days Walks in Home: Needs assistance Is this a change from baseline?: Change from baseline, expected to last >3 days Does the patient have difficulty walking or climbing stairs?: Yes Weakness of Legs: Both Weakness of Arms/Hands: Both  Permission Sought/Granted                  Emotional Assessment              Admission diagnosis:  Confusion [R41.0] Sepsis, due to unspecified organism, unspecified whether acute organ dysfunction present ([place]) [A41.9] Sepsis ([place]) [A41.9] Patient  Active Problem List   Diagnosis Date Noted  . Sepsis ([place]) 12/24/2018  . Bacteremia   . Fever, unspecified 11/05/2018  . Episode of transient neurologic symptoms 11/04/2018  . Biliary stent migration, initial encounter   . Pancreatic cancer ([place]) 07/20/2018  . 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 Jaundice 07/20/2018  . History of ERCP 07/20/2018  . Mass of pancreas 07/20/2018  . Goals of care, counseling/discussion 07/04/2018  . Cholangitis 06/22/2018  . Port-A-Cath in place 08/31/2017  . 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[place]) 08/21/2017  . Abnormal pancreas function test   . Pancreatic abnormality   . 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 Elevated LFTs 08/04/2017  . 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 Elevated serum creatinine 11/20/2012  . Elevated PSA 03/05/2012  . Essential hypertension 11/02/2011  . Hyperlipemia 11/02/2011   PCP:  Mancel Bale, PA-C Pharmacy:   [place], [place] - [address] [place] [address] [address] Phone: [phone] Fax: [phone]     Social Determinants of Health (SDOH) Interventions    Readmission Risk Interventions No flowsheet data found.

## 2018-12-27 NOTE — Progress Notes (Signed)
PHARMACY CONSULT NOTE FOR:  OUTPATIENT  PARENTERAL ANTIBIOTIC THERAPY (OPAT)  Indication: Strep bacteremia Regimen: Rocephin 2g IV q24 hr End date: 01/06/2019  IV antibiotic discharge orders are pended. To discharging provider:  please sign these orders via discharge navigator,  Select New Orders & click on the button choice - Manage This Unsigned Work.     Thank you for allowing pharmacy to be a part of this patient's care.  Cory Kelly A 12/27/2018, 10:01 AM

## 2018-12-27 NOTE — Discharge Summary (Signed)
Physician Discharge Summary  Cory Kelly XIP:[id number] DOB: 10/02/1955 DOA: 12/24/2018  PCP: Mancel Bale, PA-C  Admit date: 12/24/2018 Discharge date: 12/27/2018  Admitted From: Home Disposition: Home  Recommendations for Outpatient Follow-up:  1. Follow up with PCP in 1-2 weeks 2. Please obtain BMP/CBC in one week your next doctors visit.  3. IV Rocephin for 10 more days, last day January 06, 2019 4. Follow-up outpatient with Dr. Learta Codding for further chemotherapy treatment  Home Health: None Equipment/Devices: None Discharge Condition: Stable CODE STATUS: Full Diet recommendation: 2 g  Brief/Interim Summary: 63 year old with history of pancreatic cancer with hepatic metastases status post biliary stent, essential hypertension, BPH was sent from the cancer center for evaluation of fevers and chills which had been ongoing for about a week.  Apparently there was also some signs of confusion and was on Cipro outpatient but because of no resolution of the symptoms he was recommended to come to the hospital.  Outpatient culture data growing strep per [facility] ID.  Due to recurrent bacteremia patient's port was removed.  Repeat cultures remain negative.  Seen by GI who thought this could be secondary to his uncovered biliary stent but no further intervention at this time until or unless becomes obstructed.  PICC line was placed, 10 more days of IV antibiotics were ordered to be completed at home.  This will finish 2-week course of antibiotics. During his previous admission in May he had undergone also extensive cardiac work-up for strep bacteremia evaluation.  Patient feels much better today and wishes to go home.   Discharge Diagnoses:  Principal Problem:   Sepsis ([place]) Active Problems:   Essential hypertension   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[place])   Port-A-Cath in place  Sepsis secondary to Streptococcus bacteremia. POA Ascites, malignant versus infectious -Chest x-ray is negative.  CT  of the abdomen pelvis is negative.  UA is overall unimpressive.  Outpatient culture data was positive for Streptococcus.  Case was discussed with infectious disease, Dr. Johnnye Sima who recommended GI evaluation and removing the Port-A-Cath.  Patient was seen by GI-recommended management with antibiotics with concerns for possible source also could be biliary stent. -Port-A-Cath to be removed today and PICC line will be placed.  Will finish 10 more days of IV antibiotic, total of 14 days.  Spoke with Dr. Benay Spice who is also agreement with this plan. -Unable to get paracentesis. -Procalcitonin significantly elevated at 42 then it started trending down to 23.  Patient really wants to go home today.  Right chest wall Port-A-Cath -Plan to remove this today by IR.  History of pancreatic cancer status post metastases - Per oncology team.  Large volume stool, constipation -Bowel regimen  Nonobstructive bilateral kidney stones -IV fluids, now on Flomax.  Peripheral neuropathy - On home gabapentin.  Essential hypertension -Resume home meds.  Consultations:  Interventional radiology  Oncology  Gastroenterology  Subjective: No complaints, feels much better.  Wants to go home.  Discharge Exam: Vitals:   12/26/18 2045 12/27/18 0525  BP:  123/75  Pulse: 60 60  Resp: 16 12  Temp: 98.2 F (36.8 C) 98.1 F (36.7 C)  SpO2: 97% 98%   Vitals:   12/26/18 0609 12/26/18 2042 12/26/18 2045 12/27/18 0525  BP: 122/77 130/85  123/75  Pulse: 62  60 60  Resp: _0 Temp: 98.1 F (36.7 C)  98.2 F (36.8 C) 98.1 F (36.7 C)  TempSrc: Oral  Oral Oral  SpO2: 99%  97% 98%  Weight:      Height:        General: Pt is alert, awake, not in acute distress Cardiovascular: RRR, S1/S2 +, no rubs, no gallops Respiratory: CTA bilaterally, no wheezing, no rhonchi Abdominal: Soft, NT, ND, bowel sounds + Extremities: no edema, no cyanosis  Discharge Instructions  Discharge Instructions     Call MD for:  difficulty breathing, headache or visual disturbances   Complete by: As directed    Call MD for:  severe uncontrolled pain   Complete by: As directed    Diet - low sodium heart healthy   Complete by: As directed    Home infusion instructions [facility] May follow [place] Dosing Protocol; May administer Cathflo as needed to maintain patency of vascular access device.; [place]ushing of vascular access device: per [facility] Protocol: 0.9% NaCl pre/post medica...   Complete by: As directed    Instructions: May follow [place] Dosing Protocol   Instructions: May administer Cathflo as needed to maintain patency of vascular access device.   Instructions: Flushing of vascular access device: per [facility] Protocol: 0.9% NaCl pre/post medication administration and prn patency; Heparin 100 u/ml, 22m for implanted ports and Heparin 10u/ml, 5251mfor all other central venous catheters.   Instructions: May follow [facility] Anaphylaxis Protocol for First Dose Administration in the home: 0.9% NaCl at 25-50 ml/hr to maintain IV access for protocol meds. Epinephrine 0.3 ml IV/IM PRN and Benadryl 25-50 IV/IM PRN s/s of anaphylaxis.   Instructions: AdWollochetnfusion Coordinator (RN) to assist per patient IV care needs in the home PRN.   Increase activity slowly   Complete by: As directed      Allergies as of 12/27/2018   No Known Allergies     Medication List    TAKE these medications   amLODipine 10 MG tablet Commonly known as: NORVASC Take 10 mg by mouth at bedtime.   cefTRIAXone  IVPB Commonly known as: ROCEPHIN Inject 2 g into the vein daily for 10 days. Indication:  Strep bacteremia Last Day of Therapy:  01/06/2019 Labs - Once weekly:  CBC/D and BMP, Labs - Every other week:  ESR and CRP   ciprofloxacin 500 MG tablet Commonly known as: CIPRO Take 1 tablet (500 mg total) by mouth 2 (two) times daily.   doxazosin 1 MG tablet Commonly known as: CARDURA Take 1 mg by mouth at  bedtime.   gabapentin 100 MG capsule Commonly known as: Neurontin Take 2 capsules (200 mg total) by mouth 2 (two) times daily.   lidocaine-prilocaine cream Commonly known as: EMLA Apply 1 application topically as needed. Apply to portacath site 1-2 hours prior to use What changed:   when to take this  reasons to take this  additional instructions   loperamide 2 MG tablet Commonly known as: Imodium A-D Take 1 tablet (2 mg total) by mouth as directed. What changed:   how much to take  when to take this  reasons to take this   olmesartan 40 MG tablet Commonly known as: Benicar Take 1 tablet (40 mg total) by mouth daily.   ondansetron 4 MG disintegrating tablet Commonly known as: Zofran ODT 51m62mDT q4 hours prn nausea/vomit   ondansetron 4 MG tablet Commonly known as: ZOFRAN Take 1 tablet (4 mg total) by mouth every 6 (six) hours as needed for nausea.   oxyCODONE-acetaminophen 10-325 MG tablet Commonly known as: Percocet Take 1 tablet by mouth every 4 (four) hours as needed for pain.  potassium chloride SA 20 MEQ tablet Commonly known as: K-DUR Take 1 tablet (20 mEq total) by mouth 2 (two) times daily.   PRAMOXINE-ZINC ACETATE EX Apply 1 application topically daily as needed (itching).   prochlorperazine 10 MG tablet Commonly known as: COMPAZINE Take 10 mg by mouth every 6 (six) hours as needed for nausea or vomiting.   tamsulosin 0.4 MG Caps capsule Commonly known as: FLOMAX Take 1 capsule (0.4 mg total) by mouth daily after supper.            Home Infusion Instuctions  (From admission, onward)         Start     Ordered   12/27/18 0000  Home infusion instructions [facility] May follow [place] Dosing Protocol; May administer Cathflo as needed to maintain patency of vascular access device.; Flushing of vascular access device: per [facility] Protocol: 0.9% NaCl pre/post medica...    Question Answer Comment  Instructions May follow [place]  Dosing Protocol   Instructions May administer Cathflo as needed to maintain patency of vascular access device.   Instructions Flushing of vascular access device: per [facility] Protocol: 0.9% NaCl pre/post medication administration and prn patency; Heparin 100 u/ml, 59m for implanted ports and Heparin 10u/ml, 550mfor all other central venous catheters.   Instructions May follow [facility] Anaphylaxis Protocol for First Dose Administration in the home: 0.9% NaCl at 25-50 ml/hr to maintain IV access for protocol meds. Epinephrine 0.3 ml IV/IM PRN and Benadryl 25-50 IV/IM PRN s/s of anaphylaxis.   Instructions [facility] Infusion Coordinator (RN) to assist per patient IV care needs in the home PRN.      12/27/18 1139         Follow-up Information    Weber, SaDamaris HippoPA-C. Schedule an appointment as soon as possible for a visit in 1 week(s).   Specialty: Physician Assistant Contact information: [phone]        ShLadell PierMD. Schedule an appointment as soon as possible for a visit in 1 week(s).   Specialty: Oncology Contact information: [phone]        No Known Allergies  You were cared for by a hospitalist during your hospital stay. If you have any questions about your discharge medications or the care you received while you were in the hospital after you are discharged, you can call the unit and asked to speak with the hospitalist on call if the hospitalist that took care of you is not available. Once you are discharged, your primary care physician will handle any further medical issues. Please note that no refills for any discharge medications will be authorized once you are discharged, as it is imperative that you return to your primary care physician (or establish a relationship with a primary care physician if you do not have one) for your aftercare needs so that they can  reassess your need for medications and monitor your lab values.   Procedures/Studies: Mr Abdomen W Wo Contrast  Result Date: 12/04/2018 CLINICAL DATA:  Follow-up pancreas cancer. Assess for worsening of disease. EXAM: MRI ABDOMEN WITHOUT AND WITH CONTRAST TECHNIQUE: Multiplanar multisequence MR imaging of the abdomen was performed both before and after the administration of intravenous contrast. CONTRAST:  10 cc Gadavist COMPARISON:  11/06/2018 FINDINGS: Lower chest: Tiny bilateral pleural effusions are identified, similar to previous exam. Hepatobiliary: Multiple liver metastases are again noted. Index  lesion within segment 5 measures 1.7 by 1.3 cm, image 44/1003. Previously 1.7 x 1.5 cm. The index lesion within segment 2 measures 1.0 by 0.8 cm, image 29/1003. Previously 1.2 x 0.8 cm. New lesion within segment 4 a measures 1 by 0.9 cm, image 31/1002. The remainder of the liver metastasis are relatively stable in the interval. Gallbladder unremarkable. Common bile duct stent in place without intrahepatic bile duct dilatation. Pancreas: The soft tissue mass within the head of pancreas measures approximately 3.7 x 4.1 cm, image 59/1001. Previously 3.5 x 3.1 cm. Diffuse atrophy and pancreatic ductal dilatation is again noted within the body and tail. Encasement and obstruction of the portal venous confluence is again identified with distal reconstitution of the portal vein via collaterals. Spleen: Splenomegaly is again noted. The spleen has a cranial caudal dimension 20.1 cm. Similar. Adrenals/Urinary Tract: Normal appearance of the adrenal glands. No kidney mass or hydronephrosis. Simple appearing inferior pole right kidney cyst measures 2.7 cm. Stomach/Bowel: Visualized portions within the abdomen are unremarkable. Vascular/Lymphatic: No evidence for abdominal aortic aneurysm. Esophageal and splenic varices as well as portosystemic venous collaterals compatible with portal venous hypertension. No adenopathy  identified. Other:  Interval increase in volume of abdominal ascites. Musculoskeletal: No suspicious bone lesions identified. IMPRESSION: 1. There is been mild increase in size of mass involving head of pancreas. 2. Diffuse hepatic metastasis. The previous index lesions are unchanged in size compared with previous exam. A single new metastasis is identified within the medial segment of left lobe of liver. 3. 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 Abdominal ascites has increased in the interval. Electronically Signed   By: Kerby Moors M.D.   On: 12/04/2018 11:27   Ct Abdomen Pelvis W Contrast  Result Date: 12/24/2018 CLINICAL DATA:  Abdominal distension, fever, history of pancreatic cancer EXAM: CT ABDOMEN AND PELVIS WITH CONTRAST TECHNIQUE: Multidetector CT imaging of the abdomen and pelvis was performed using the standard protocol following bolus administration of intravenous contrast. CONTRAST:  12m OMNIPAQUE IOHEXOL 300 MG/ML  SOLN COMPARISON:  MR abdomen pelvis, 12/04/2018, 10/30/2018 FINDINGS: Lower chest: No acute abnormality. Hepatobiliary: Known liver metastases are poorly appreciated by single-phase CT, for example in the dome of the liver (series 2, image 14). No gallstones or gallbladder wall thickening. Common bile duct stent is positioned tip in the descending portion of the duodenum with post stenting pneumobilia. Pancreas: Discrete pancreatic head mass is not well appreciated, primarily appreciated by mass effect on the confluence of the portal vein (series 2, image 38. There is atrophy of the pancreatic body and tail and dilatation pancreatic duct. Spleen: Splenomegaly. Adrenals/Urinary Tract: Adrenal glands are unremarkable. Nonobstructive bilateral nephrolithiasis. Bladder is unremarkable. Stomach/Bowel: Stomach is within normal limits. Appendix is not clearly visualized. No evidence of bowel wall thickening, distention, or inflammatory changes. Large  burden of stool in the distal colon and rectum. Vascular/Lymphatic: Aortic atherosclerosis. No enlarged abdominal or pelvic lymph nodes. Reproductive: No mass or other significant abnormality. Other: No abdominal wall hernia or abnormality. Moderate volume ascites. Musculoskeletal: No acute or significant osseous findings. IMPRESSION: 1. Redemonstrated pancreatic malignancy with hepatic metastases, status post common bile duct stenting. Primary mass and hepatic metastases are better appreciated by recent multiphasic MRI. 2.  Moderate volume ascites, which is similar to prior MRI. 3.  Splenomegaly. 4.  Large burden of stool in the distal colon and rectum. 5.  Nonobstructive bilateral nephrolithiasis. Electronically Signed   By: AEddie CandleM.D.   On: 12/24/2018 14:35  Mr 3d Recon At Scanner  Result Date: 12/04/2018 CLINICAL DATA:  Follow-up pancreas cancer. Assess for worsening of disease. EXAM: MRI ABDOMEN WITHOUT AND WITH CONTRAST TECHNIQUE: Multiplanar multisequence MR imaging of the abdomen was performed both before and after the administration of intravenous contrast. CONTRAST:  10 cc Gadavist COMPARISON:  11/06/2018 FINDINGS: Lower chest: Tiny bilateral pleural effusions are identified, similar to previous exam. Hepatobiliary: Multiple liver metastases are again noted. Index lesion within segment 5 measures 1.7 by 1.3 cm, image 44/1003. Previously 1.7 x 1.5 cm. The index lesion within segment 2 measures 1.0 by 0.8 cm, image 29/1003. Previously 1.2 x 0.8 cm. New lesion within segment 4 a measures 1 by 0.9 cm, image 31/1002. The remainder of the liver metastasis are relatively stable in the interval. Gallbladder unremarkable. Common bile duct stent in place without intrahepatic bile duct dilatation. Pancreas: The soft tissue mass within the head of pancreas measures approximately 3.7 x 4.1 cm, image 59/1001. Previously 3.5 x 3.1 cm. Diffuse atrophy and pancreatic ductal dilatation is again noted within the  body and tail. Encasement and obstruction of the portal venous confluence is again identified with distal reconstitution of the portal vein via collaterals. Spleen: Splenomegaly is again noted. The spleen has a cranial caudal dimension 20.1 cm. Similar. Adrenals/Urinary Tract: Normal appearance of the adrenal glands. No kidney mass or hydronephrosis. Simple appearing inferior pole right kidney cyst measures 2.7 cm. Stomach/Bowel: Visualized portions within the abdomen are unremarkable. Vascular/Lymphatic: No evidence for abdominal aortic aneurysm. Esophageal and splenic varices as well as portosystemic venous collaterals compatible with portal venous hypertension. No adenopathy identified. Other:  Interval increase in volume of abdominal ascites. Musculoskeletal: No suspicious bone lesions identified. IMPRESSION: 1. There is been mild increase in size of mass involving head of pancreas. 2. Diffuse hepatic metastasis. The previous index lesions are unchanged in size compared with previous exam. A single new metastasis is identified within the medial segment of left lobe of liver. 3. 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 Abdominal ascites has increased in the interval. Electronically Signed   By: Kerby Moors M.D.   On: 12/04/2018 11:27   Dg Chest Portable 1 View  Result Date: 12/24/2018 CLINICAL DATA:  Fever and weakness. EXAM: PORTABLE CHEST 1 VIEW COMPARISON:  11/04/2018 FINDINGS: The cardiac silhouette, mediastinal and hilar contours are stable. Mild cardiac enlargement and mild tortuosity of the thoracic aorta. The right IJ power port is stable. The lungs are clear. No pleural effusions. The bony thorax is intact. IMPRESSION: No acute cardiopulmonary findings. Electronically Signed   By: Marijo Sanes M.D.   On: 12/24/2018 13:59   Dg Ercp  Result Date: 12/06/2018 CLINICAL DATA:  63 year old male with a history of pancreatic cancer and obstructed jaundice with prior  stent placement and progressive liver enzyme elevation. EXAM: ERCP TECHNIQUE: Multiple spot images obtained with the fluoroscopic device and submitted for interpretation post-procedure. FLUOROSCOPY TIME:  Fluoroscopy Time:  3 minutes 43 seconds Radiation Exposure Index (if provided by the fluoroscopic device): 66.7 mGy COMPARISON:  MRCP 12/04/2018 FINDINGS: A total of 4 intraoperative saved images are submitted for review. The images demonstrate a metal stent in the common hepatic and common bile ducts with a flexible endoscope in the descending duodenum. Subsequent images demonstrate wire cannulation of the stent and balloon dilation. IMPRESSION: ERCP with cannulation of the previously placed metal stent and balloon dilatation. These images were submitted for radiologic interpretation only. Please see the procedural report for the amount of contrast  and the fluoroscopy time utilized. Electronically Signed   By: Jacqulynn Cadet M.D.   On: 12/06/2018 11:51   Ir Abdomen US Limited  Result Date: 11/29/2018 CLINICAL DATA:  Metastatic pancreas adenocarcinoma, abdominal distension assess for ascites EXAM: LIMITED ABDOMEN ULTRASOUND FOR ASCITES TECHNIQUE: Limited ultrasound survey for ascites was performed in all four abdominal quadrants. COMPARISON:  None. FINDINGS: Ultrasound survey of the abdominal 4 quadrants demonstrates trace amount of lower abdominopelvic ascites. Not enough to warrant therapeutic paracentesis. Procedure not performed. IMPRESSION: Trace amount of abdominopelvic ascites by ultrasound. Procedure not performed. Electronically Signed   By: Jerilynn Mages.  Shick M.D.   On: 11/29/2018 13:25   [place] Ascites (abdomen Limited)  Result Date: 12/25/2018 CLINICAL DATA:  63 year old male with abdominal distension, ascites EXAM: LIMITED ABDOMEN ULTRASOUND FOR ASCITES TECHNIQUE: Limited ultrasound survey for ascites was performed in all four abdominal quadrants. COMPARISON:  Prior CT scan of the abdomen and pelvis  12/24/2018 FINDINGS: Sonographic interrogation was performed of the 4 quadrants of the abdomen. There is trace perihepatic fluid in the right upper quadrant with multiple small loops of bowel. Overall, fluid volume is low. No safe window for paracentesis. IMPRESSION: Small volume ascites, insufficient for paracentesis. Electronically Signed   By: Jacqulynn Cadet M.D.   On: 12/25/2018 12:13      The results of significant diagnostics from this hospitalization (including imaging, microbiology, ancillary and laboratory) are listed below for reference.     Microbiology: Recent Results (from the past 240 hour(s))  Culture, Blood     Status: None (Preliminary result)   Collection Time: 12/24/18 11:25 AM   Specimen: BLOOD RIGHT ARM  Result Value Ref Range Status   Specimen Description   Final    BLOOD RIGHT ARM Performed at [facility], [address], [place], [place] [address]    Special Requests   Final    BOTTLES DRAWN AEROBIC AND ANAEROBIC Blood Culture adequate volume   Culture  Setup Time   Final    AEROBIC BOTTLE ONLY GRAM POSITIVE COCCI CRITICAL RESULT CALLED TO, READ BACK BY AND VERIFIED WITH: J LEGGE PHARMD 12/25/18 2030 JDW GRAM VARIABLE ROD ANAEROBIC BOTTLE ONLY CRITICAL RESULT CALLED TO, READ BACK BY AND VERIFIED WITH: Damian Leavell [id number] Performed at [facility], [place] [address], [place],  [address]    Culture GRAM POSITIVE COCCI  Final   Report Status PENDING  Incomplete  Blood Culture ID Panel (Reflexed)     Status: Abnormal   Collection Time: 12/24/18 11:25 AM  Result Value Ref Range Status   Enterococcus species NOT DETECTED NOT DETECTED Final   Listeria monocytogenes NOT DETECTED NOT DETECTED Final   Staphylococcus species NOT DETECTED NOT DETECTED Final   Staphylococcus aureus (BCID) NOT DETECTED NOT DETECTED Final   Streptococcus species DETECTED (A) NOT DETECTED Final    Comment: Not Enterococcus species,  Streptococcus agalactiae, Streptococcus pyogenes, or Streptococcus pneumoniae. CRITICAL RESULT CALLED TO, READ BACK BY AND VERIFIED WITH: J LEGGE PHARMD 12/25/18 2030 JDW    Streptococcus agalactiae NOT DETECTED NOT DETECTED Final   Streptococcus pneumoniae NOT DETECTED NOT DETECTED Final   Streptococcus pyogenes NOT DETECTED NOT DETECTED Final   Acinetobacter baumannii NOT DETECTED NOT DETECTED Final   Enterobacteriaceae species NOT DETECTED NOT DETECTED Final   Enterobacter cloacae complex NOT DETECTED NOT DETECTED Final   Escherichia coli NOT DETECTED NOT DETECTED Final   Klebsiella oxytoca NOT DETECTED NOT DETECTED Final   Klebsiella pneumoniae NOT DETECTED  NOT DETECTED Final   Proteus species NOT DETECTED NOT DETECTED Final   Serratia marcescens NOT DETECTED NOT DETECTED Final   Haemophilus influenzae NOT DETECTED NOT DETECTED Final   Neisseria meningitidis NOT DETECTED NOT DETECTED Final   Pseudomonas aeruginosa NOT DETECTED NOT DETECTED Final   Candida albicans NOT DETECTED NOT DETECTED Final   Candida glabrata NOT DETECTED NOT DETECTED Final   Candida krusei NOT DETECTED NOT DETECTED Final   Candida parapsilosis NOT DETECTED NOT DETECTED Final   Candida tropicalis NOT DETECTED NOT DETECTED Final    Comment: Performed at [facility], [place] [address] [place], [place] [address]  Culture, Blood     Status: None (Preliminary result)   Collection Time: 12/24/18 12:09 PM   Specimen: Porta Cath; Blood  Result Value Ref Range Status   Specimen Description   Final    PORTA CATH Performed at [facility], [place] [address], [place], [place] [address]    Special Requests   Final    NONE Performed at [facility], [place] [address], [place], [place] [address]    Culture  Setup Time   Final    GRAM NEGATIVE RODS ANAEROBIC BOTTLE ONLY Organism ID to follow Performed at [facility], [place] [address], [place], [place] [address]     Culture GRAM NEGATIVE RODS  Final   Report Status PENDING  Incomplete  Blood Culture ID Panel (Reflexed)     Status: None   Collection Time: 12/24/18 12:27 PM  Result Value Ref Range Status   Enterococcus species NOT DETECTED NOT DETECTED Final   Listeria monocytogenes NOT DETECTED NOT DETECTED Final   Staphylococcus species NOT DETECTED NOT DETECTED Final   Staphylococcus aureus (BCID) NOT DETECTED NOT DETECTED Final   Streptococcus species NOT DETECTED NOT DETECTED Final   Streptococcus agalactiae NOT DETECTED NOT DETECTED Final   Streptococcus pneumoniae NOT DETECTED NOT DETECTED Final   Streptococcus pyogenes NOT DETECTED NOT DETECTED Final   Acinetobacter baumannii NOT DETECTED NOT DETECTED Final   Enterobacteriaceae species NOT DETECTED NOT DETECTED Final   Enterobacter cloacae complex NOT DETECTED NOT DETECTED Final   Escherichia coli NOT DETECTED NOT DETECTED Final   Klebsiella oxytoca NOT DETECTED NOT DETECTED Final   Klebsiella pneumoniae NOT DETECTED NOT DETECTED Final   Proteus species NOT DETECTED NOT DETECTED Final   Serratia marcescens NOT DETECTED NOT DETECTED Final   Haemophilus influenzae NOT DETECTED NOT DETECTED Final   Neisseria meningitidis NOT DETECTED NOT DETECTED Final   Pseudomonas aeruginosa NOT DETECTED NOT DETECTED Final   Candida albicans NOT DETECTED NOT DETECTED Final   Candida glabrata NOT DETECTED NOT DETECTED Final   Candida krusei NOT DETECTED NOT DETECTED Final   Candida parapsilosis NOT DETECTED NOT DETECTED Final   Candida tropicalis NOT DETECTED NOT DETECTED Final    Comment: Performed at [facility], [place]. [address], [place], [place] [address]  Urine culture     Status: None   Collection Time: 12/24/18  1:28 PM   Specimen: Urine, Clean Catch  Result Value Ref Range Status   Specimen Description   Final    URINE, CLEAN CATCH Performed at [facility], [place] [address], [place], [place] [address]    Special  Requests   Final    NONE Performed at [facility], [place] [address], [place],  [address]    Culture   Final    NO GROWTH Performed at  [facility], [place] [address], [place], [place] [address]    Report Status 12/25/2018 FINAL  Final  SARS Coronavirus 2 (CEPHEID - Performed in [facility]), Hosp Order     Status: None   Collection Time: 12/24/18  1:28 PM   Specimen: Nasopharyngeal Swab  Result Value Ref Range Status   SARS Coronavirus 2 NEGATIVE NEGATIVE Final    Comment: (NOTE) If result is NEGATIVE SARS-CoV-2 target nucleic acids are NOT DETECTED. The SARS-CoV-2 RNA is generally detectable in upper and lower  respiratory specimens during the acute phase of infection. The lowest  concentration of SARS-CoV-2 viral copies this assay can detect is 250  copies / mL. A negative result does not preclude SARS-CoV-2 infection  and should not be used as the sole basis for treatment or other  patient management decisions.  A negative result may occur with  improper specimen collection / handling, submission of specimen other  than nasopharyngeal swab, presence of viral mutation(s) within the  areas targeted by this assay, and inadequate number of viral copies  (<250 copies / mL). A negative result must be combined with clinical  observations, patient history, and epidemiological information. If result is POSITIVE SARS-CoV-2 target nucleic acids are DETECTED. The SARS-CoV-2 RNA is generally detectable in upper and lower  respiratory specimens dur ing the acute phase of infection.  Positive  results are indicative of active infection with SARS-CoV-2.  Clinical  correlation with patient history and other diagnostic information is  necessary to determine patient infection status.  Positive results do  not rule out bacterial infection or co-infection with other viruses. If result is PRESUMPTIVE POSTIVE SARS-CoV-2 nucleic acids MAY BE PRESENT.   A  presumptive positive result was obtained on the submitted specimen  and confirmed on repeat testing.  While 2019 novel coronavirus  (SARS-CoV-2) nucleic acids may be present in the submitted sample  additional confirmatory testing may be necessary for epidemiological  and / or clinical management purposes  to differentiate between  SARS-CoV-2 and other Sarbecovirus currently known to infect humans.  If clinically indicated additional testing with an alternate test  methodology [phone]) is advised. The SARS-CoV-2 RNA is generally  detectable in upper and lower respiratory sp ecimens during the acute  phase of infection. The expected result is Negative. Fact Sheet for Patients:  StrictlyIdeas.no Fact Sheet for Healthcare Providers: BankingDealers.co.za This test is not yet approved or cleared by the [place] FDA and has been authorized for detection and/or diagnosis of SARS-CoV-2 by FDA under an Emergency Use Authorization (EUA).  This EUA will remain in effect (meaning this test can be used) for the duration of the COVID-19 declaration under Section 564(b)(1) of the Act, 21 U.S.C. section 360bbb-3(b)(1), unless the authorization is terminated or revoked sooner. Performed at [facility], [place] [address] [place], [place] [address]   Culture, blood (routine x 2)     Status: None (Preliminary result)   Collection Time: 12/24/18  5:22 PM   Specimen: Left Antecubital; Blood  Result Value Ref Range Status   Specimen Description   Final    LEFT ANTECUBITAL Performed at [place] [address] [place], [place] [address]    Special Requests   Final    BOTTLES DRAWN AEROBIC AND ANAEROBIC Blood Culture results may not be optimal due to an excessive volume of blood received in culture bottles Performed at [place] [address] [place], [place] [address]  Culture   Final     NO GROWTH 3 DAYS Performed at [facility], [place] [address] [place], [place] [address]    Report Status PENDING  Incomplete  Culture, blood (routine x 2)     Status: None (Preliminary result)   Collection Time: 12/24/18  5:29 PM   Specimen: Right Antecubital; Blood  Result Value Ref Range Status   Specimen Description   Final    RIGHT ANTECUBITAL Performed at [place] [address] [place], [place] [address]    Special Requests   Final    BOTTLES DRAWN AEROBIC AND ANAEROBIC Blood Culture adequate volume Performed at [place] [address], [place], [place] [address]    Culture   Final    NO GROWTH 3 DAYS Performed at [facility], [place] [address], [place], [place] [address]    Report Status PENDING  Incomplete     Labs: BNP (last 3 results) No results for input(s): BNP in the last 8760 hours. Basic Metabolic Panel: Recent Labs  Lab 12/24/18 1205 12/25/18 0725 12/26/18 0555 12/27/18 0652  NA 135 135 133* 135  K 4.8 3.6 3.2* 3.4*  CL 100 103 102 105  CO2 20* _0 GLUCOSE 132* 104* 112* 114*  BUN 6* 7* 6* 5*  CREATININE 0.94 0.71 0.72 0.68  CALCIUM 8.9 7.9* 7.8* 7.7*  MG  --   --  1.6* 2.0   Liver Function Tests: Recent Labs  Lab 12/24/18 1205 12/27/18 0652  AST 40 28  ALT 73* 56*  ALKPHOS 754* 531*  BILITOT 0.8 0.4  PROT 6.3* 4.9*  ALBUMIN 3.0* 2.5*   Recent Labs  Lab 12/24/18 1328  LIPASE 24   No results for input(s): AMMONIA in the last 168 hours. CBC: Recent Labs  Lab 12/24/18 1123 12/25/18 0725 12/26/18 0555 12/27/18 0652  WBC 9.3 2.3* 2.6* 2.2*  NEUTROABS 8.2* 1.6*  --  1.7  HGB 12.0* 9.2* 9.3* 9.2*  HCT 37.0* 28.4* 28.9* 27.8*  MCV 87.5 87.9 87.6 86.1  PLT 169 60* 78* 90*   Cardiac Enzymes: No results for input(s): CKTOTAL, CKMB, CKMBINDEX, TROPONINI in the last 168 hours. BNP: Invalid input(s): POCBNP CBG: No results for input(s): GLUCAP in the last 168  hours. D-Dimer No results for input(s): DDIMER in the last 72 hours. Hgb A1c No results for input(s): HGBA1C in the last 72 hours. Lipid Profile No results for input(s): CHOL, HDL, LDLCALC, TRIG, CHOLHDL, LDLDIRECT in the last 72 hours. Thyroid function studies No results for input(s): TSH, T4TOTAL, T3FREE, THYROIDAB in the last 72 hours.  Invalid input(s): FREET3 Anemia work up No results for input(s): VITAMINB12, FOLATE, FERRITIN, TIBC, IRON, RETICCTPCT in the last 72 hours. Urinalysis    Component Value Date/Time   COLORURINE YELLOW 12/24/2018 1328   APPEARANCEUR CLEAR 12/24/2018 1328   LABSPEC 1.016 12/24/2018 1328   PHURINE 5.0 12/24/2018 1328   GLUCOSEU 50 (A) 12/24/2018 1328   HGBUR MODERATE (A) 12/24/2018 1328   BILIRUBINUR NEGATIVE 12/24/2018 1328   KETONESUR NEGATIVE 12/24/2018 1328   PROTEINUR 30 (A) 12/24/2018 1328   UROBILINOGEN 0.2 02/10/2007 1109   NITRITE NEGATIVE 12/24/2018 1328   LEUKOCYTESUR NEGATIVE 12/24/2018 1328   Sepsis Labs Invalid input(s): PROCALCITONIN,  WBC,  LACTICIDVEN Microbiology Recent Results (from the past 240 hour(s))  Culture, Blood     Status: None (Preliminary result)   Collection Time: 12/24/18 11:25 AM   Specimen: BLOOD RIGHT ARM  Result Value Ref Range Status   Specimen Description   Final    BLOOD RIGHT ARM Performed at [facility], [place] [address] [place], [place] [address]    Special Requests   Final    BOTTLES DRAWN AEROBIC AND ANAEROBIC Blood Culture adequate volume   Culture  Setup Time   Final    AEROBIC BOTTLE ONLY GRAM POSITIVE COCCI CRITICAL RESULT CALLED TO, READ BACK BY AND VERIFIED WITH: J LEGGE PHARMD 12/25/18 2030 JDW GRAM VARIABLE ROD ANAEROBIC BOTTLE ONLY CRITICAL RESULT CALLED TO, READ BACK BY AND VERIFIED WITH: Damian Leavell [id number] MLM Performed at [facility], [place] [address], [place], [place] [address]    Culture GRAM POSITIVE COCCI  Final   Report Status PENDING   Incomplete  Blood Culture ID Panel (Reflexed)     Status: Abnormal   Collection Time: 12/24/18 11:25 AM  Result Value Ref Range Status   Enterococcus species NOT DETECTED NOT DETECTED Final   Listeria monocytogenes NOT DETECTED NOT DETECTED Final   Staphylococcus species NOT DETECTED NOT DETECTED Final   Staphylococcus aureus (BCID) NOT DETECTED NOT DETECTED Final   Streptococcus species DETECTED (A) NOT DETECTED Final    Comment: Not Enterococcus species, Streptococcus agalactiae, Streptococcus pyogenes, or Streptococcus pneumoniae. CRITICAL RESULT CALLED TO, READ BACK BY AND VERIFIED WITH: J LEGGE PHARMD 12/25/18 2030 JDW    Streptococcus agalactiae NOT DETECTED NOT DETECTED Final   Streptococcus pneumoniae NOT DETECTED NOT DETECTED Final   Streptococcus pyogenes NOT DETECTED NOT DETECTED Final   Acinetobacter baumannii NOT DETECTED NOT DETECTED Final   Enterobacteriaceae species NOT DETECTED NOT DETECTED Final   Enterobacter cloacae complex NOT DETECTED NOT DETECTED Final   Escherichia coli NOT DETECTED NOT DETECTED Final   Klebsiella oxytoca NOT DETECTED NOT DETECTED Final   Klebsiella pneumoniae NOT DETECTED NOT DETECTED Final   Proteus species NOT DETECTED NOT DETECTED Final   Serratia marcescens NOT DETECTED NOT DETECTED Final   Haemophilus influenzae NOT DETECTED NOT DETECTED Final   Neisseria meningitidis NOT DETECTED NOT DETECTED Final   Pseudomonas aeruginosa NOT DETECTED NOT DETECTED Final   Candida albicans NOT DETECTED NOT DETECTED Final   Candida glabrata NOT DETECTED NOT DETECTED Final   Candida krusei NOT DETECTED NOT DETECTED Final   Candida parapsilosis NOT DETECTED NOT DETECTED Final   Candida tropicalis NOT DETECTED NOT DETECTED Final    Comment: Performed at [facility], [place] [address], [place], [place] [address]  Culture, Blood     Status: None (Preliminary result)   Collection Time: 12/24/18 12:09 PM   Specimen: Porta Cath; Blood  Result Value Ref  Range Status   Specimen Description   Final    PORTA CATH Performed at [facility], [place] [address], [place], [place] [address]    Special Requests   Final    NONE Performed at [facility], [place] [address], [place], [place] [address]    Culture  Setup Time   Final    GRAM NEGATIVE RODS ANAEROBIC BOTTLE ONLY Organism ID to follow Performed at [facility], [place] [address] [place], [place] [address]    Culture GRAM NEGATIVE RODS  Final   Report Status PENDING  Incomplete  Blood Culture ID Panel (Reflexed)     Status: None   Collection Time: 12/24/18 12:27 PM  Result Value Ref Range Status   Enterococcus species NOT DETECTED NOT DETECTED Final  Listeria monocytogenes NOT DETECTED NOT DETECTED Final   Staphylococcus species NOT DETECTED NOT DETECTED Final   Staphylococcus aureus (BCID) NOT DETECTED NOT DETECTED Final   Streptococcus species NOT DETECTED NOT DETECTED Final   Streptococcus agalactiae NOT DETECTED NOT DETECTED Final   Streptococcus pneumoniae NOT DETECTED NOT DETECTED Final   Streptococcus pyogenes NOT DETECTED NOT DETECTED Final   Acinetobacter baumannii NOT DETECTED NOT DETECTED Final   Enterobacteriaceae species NOT DETECTED NOT DETECTED Final   Enterobacter cloacae complex NOT DETECTED NOT DETECTED Final   Escherichia coli NOT DETECTED NOT DETECTED Final   Klebsiella oxytoca NOT DETECTED NOT DETECTED Final   Klebsiella pneumoniae NOT DETECTED NOT DETECTED Final   Proteus species NOT DETECTED NOT DETECTED Final   Serratia marcescens NOT DETECTED NOT DETECTED Final   Haemophilus influenzae NOT DETECTED NOT DETECTED Final   Neisseria meningitidis NOT DETECTED NOT DETECTED Final   Pseudomonas aeruginosa NOT DETECTED NOT DETECTED Final   Candida albicans NOT DETECTED NOT DETECTED Final   Candida glabrata NOT DETECTED NOT DETECTED Final   Candida krusei NOT DETECTED NOT DETECTED Final   Candida parapsilosis NOT  DETECTED NOT DETECTED Final   Candida tropicalis NOT DETECTED NOT DETECTED Final    Comment: Performed at [facility], [place] [address] [place], [place] [address]  Urine culture     Status: None   Collection Time: 12/24/18  1:28 PM   Specimen: Urine, Clean Catch  Result Value Ref Range Status   Specimen Description   Final    URINE, CLEAN CATCH Performed at [facility], [place] [address] [place], [place] [address]    Special Requests   Final    NONE Performed at [facility], [place] [address] [place], [place] [address]    Culture   Final    NO GROWTH Performed at [facility], [place] [address], [place], [place] [address]    Report Status 12/25/2018 FINAL  Final  SARS Coronavirus 2 (CEPHEID - Performed in [place] hospital lab), Hosp Order     Status: None   Collection Time: 12/24/18  1:28 PM   Specimen: Nasopharyngeal Swab  Result Value Ref Range Status   SARS Coronavirus 2 NEGATIVE NEGATIVE Final    Comment: (NOTE) If result is NEGATIVE SARS-CoV-2 target nucleic acids are NOT DETECTED. The SARS-CoV-2 RNA is generally detectable in upper and lower  respiratory specimens during the acute phase of infection. The lowest  concentration of SARS-CoV-2 viral copies this assay can detect is 250  copies / mL. A negative result does not preclude SARS-CoV-2 infection  and should not be used as the sole basis for treatment or other  patient management decisions.  A negative result may occur with  improper specimen collection / handling, submission of specimen other  than nasopharyngeal swab, presence of viral mutation(s) within the  areas targeted by this assay, and inadequate number of viral copies  (<250 copies / mL). A negative result must be combined with clinical  observations, patient history, and epidemiological information. If result is POSITIVE SARS-CoV-2 target nucleic acids are DETECTED. The SARS-CoV-2 RNA is generally  detectable in upper and lower  respiratory specimens dur ing the acute phase of infection.  Positive  results are indicative of active infection with SARS-CoV-2.  Clinical  correlation with patient history and other diagnostic information is  necessary to determine patient infection status.  Positive results do  not rule out bacterial infection or co-infection with other viruses.  If result is PRESUMPTIVE POSTIVE SARS-CoV-2 nucleic acids MAY BE PRESENT.   A presumptive positive result was obtained on the submitted specimen  and confirmed on repeat testing.  While 2019 novel coronavirus  (SARS-CoV-2) nucleic acids may be present in the submitted sample  additional confirmatory testing may be necessary for epidemiological  and / or clinical management purposes  to differentiate between  SARS-CoV-2 and other Sarbecovirus currently known to infect humans.  If clinically indicated additional testing with an alternate test  methodology [phone]) is advised. The SARS-CoV-2 RNA is generally  detectable in upper and lower respiratory sp ecimens during the acute  phase of infection. The expected result is Negative. Fact Sheet for Patients:  StrictlyIdeas.no Fact Sheet for Healthcare Providers: BankingDealers.co.za This test is not yet approved or cleared by the [place] FDA and has been authorized for detection and/or diagnosis of SARS-CoV-2 by FDA under an Emergency Use Authorization (EUA).  This EUA will remain in effect (meaning this test can be used) for the duration of the COVID-19 declaration under Section 564(b)(1) of the Act, 21 U.S.C. section 360bbb-3(b)(1), unless the authorization is terminated or revoked sooner. Performed at [facility], [place] [address] [place], [place] [address]   Culture, blood (routine x 2)     Status: None (Preliminary result)   Collection Time: 12/24/18  5:22 PM   Specimen: Left  Antecubital; Blood  Result Value Ref Range Status   Specimen Description   Final    LEFT ANTECUBITAL Performed at [place] [address] [place], [place] [address]    Special Requests   Final    BOTTLES DRAWN AEROBIC AND ANAEROBIC Blood Culture results may not be optimal due to an excessive volume of blood received in culture bottles Performed at [place] [address], [place], [place] [address]    Culture   Final    NO GROWTH 3 DAYS Performed at [facility], [place] [address] [place], [place] [address]    Report Status PENDING  Incomplete  Culture, blood (routine x 2)     Status: None (Preliminary result)   Collection Time: 12/24/18  5:29 PM   Specimen: Right Antecubital; Blood  Result Value Ref Range Status   Specimen Description   Final    RIGHT ANTECUBITAL Performed at [place] [address] [place], [place] [address]    Special Requests   Final    BOTTLES DRAWN AEROBIC AND ANAEROBIC Blood Culture adequate volume Performed at [place] [address] [place], [place] [address]    Culture   Final    NO GROWTH 3 DAYS Performed at [facility], [place] [address] [place], [place] [address]    Report Status PENDING  Incomplete     Time coordinating discharge:  I have spent 35 minutes face to face with the patient and on the ward discussing the patients care, assessment, plan and disposition with other care givers. >50% of the time was devoted counseling the patient about the risks and benefits of treatment/Discharge disposition and coordinating care.   SIGNED:   Damita Lack, MD  [facility] 12/27/2018, 11:40 AM   If 7PM-7AM, please contact night-coverage [email or web address]

## 2018-12-27 NOTE — Progress Notes (Signed)
Cory Kelly to be D/C'd Home per MD order.  Discussed prescriptions and follow up appointments with the patient. Prescriptions given to patient, medication list explained in detail. Pt verbalized understanding.  Allergies as of 12/27/2018   No Known Allergies     Medication List    TAKE these medications   amLODipine 10 MG tablet Commonly known as: NORVASC Take 10 mg by mouth at bedtime.   cefTRIAXone  IVPB Commonly known as: ROCEPHIN Inject 2 g into the vein daily for 10 days. Indication:  Strep bacteremia Last Day of Therapy:  01/06/2019 Labs - Once weekly:  CBC/D and BMP, Labs - Every other week:  ESR and CRP   ciprofloxacin 500 MG tablet Commonly known as: CIPRO Take 1 tablet (500 mg total) by mouth 2 (two) times daily.   doxazosin 1 MG tablet Commonly known as: CARDURA Take 1 mg by mouth at bedtime.   gabapentin 100 MG capsule Commonly known as: Neurontin Take 2 capsules (200 mg total) by mouth 2 (two) times daily.   lidocaine-prilocaine cream Commonly known as: EMLA Apply 1 application topically as needed. Apply to portacath site 1-2 hours prior to use What changed:   when to take this  reasons to take this  additional instructions   loperamide 2 MG tablet Commonly known as: Imodium A-D Take 1 tablet (2 mg total) by mouth as directed. What changed:   how much to take  when to take this  reasons to take this   olmesartan 40 MG tablet Commonly known as: Benicar Take 1 tablet (40 mg total) by mouth daily.   ondansetron 4 MG disintegrating tablet Commonly known as: Zofran ODT '4mg'$  ODT q4 hours prn nausea/vomit   ondansetron 4 MG tablet Commonly known as: ZOFRAN Take 1 tablet (4 mg total) by mouth every 6 (six) hours as needed for nausea.   oxyCODONE-acetaminophen 10-325 MG tablet Commonly known as: Percocet Take 1 tablet by mouth every 4 (four) hours as needed for pain.   potassium chloride SA 20 MEQ tablet Commonly known as: K-DUR Take 1 tablet  (20 mEq total) by mouth 2 (two) times daily.   PRAMOXINE-ZINC ACETATE EX Apply 1 application topically daily as needed (itching).   prochlorperazine 10 MG tablet Commonly known as: COMPAZINE Take 10 mg by mouth every 6 (six) hours as needed for nausea or vomiting.   tamsulosin 0.4 MG Caps capsule Commonly known as: FLOMAX Take 1 capsule (0.4 mg total) by mouth daily after supper.            Home Infusion Instuctions  (From admission, onward)         Start     Ordered   12/27/18 0000  Home infusion instructions [facility] May follow [facility] Dosing Protocol; May administer Cathflo as needed to maintain patency of vascular access device.; Flushing of vascular access device: per [facility] Protocol: 0.9% NaCl pre/post medica...    Question Answer Comment  Instructions May follow [facility] Dosing Protocol   Instructions May administer Cathflo as needed to maintain patency of vascular access device.   Instructions Flushing of vascular access device: per [facility] Protocol: 0.9% NaCl pre/post medication administration and prn patency; Heparin 100 u/ml, 59m for implanted ports and Heparin 10u/ml, 571mfor all other central venous catheters.   Instructions May follow [facility] Anaphylaxis Protocol for First Dose Administration in the home: 0.9% NaCl at 25-50 ml/hr to maintain IV access for protocol meds. Epinephrine 0.3 ml IV/IM PRN and Benadryl 25-50 IV/IM  PRN s/s of anaphylaxis.   Instructions [facility] Infusion Coordinator (RN) to assist per patient IV care needs in the home PRN.      12/27/18 1139          Vitals:   12/27/18 1400 12/27/18 1405  BP: 120/78 121/74  Pulse: (!) 59 64  Resp: 14 17  Temp:    SpO2: 100% 100%    Skin clean, dry and intact without evidence of skin break down, no evidence of skin tears noted. IV catheter discontinued intact. Site without signs and symptoms of complications. Dressing and pressure applied. Pt denies pain at this time. No complaints  noted.  An After Visit Summary was printed and given to the patient. Patient escorted via [place], and D/C home via private auto.  Nonie Hoyer S 12/27/2018 3:10 PM

## 2018-12-27 NOTE — Discharge Instructions (Signed)
Moderate Conscious Sedation, Adult, Care After °These instructions provide you with information about caring for yourself after your procedure. Your health care provider may also give you more specific instructions. Your treatment has been planned according to current medical practices, but problems sometimes occur. Call your health care provider if you have any problems or questions after your procedure. °What can I expect after the procedure? °After your procedure, it is common: °· To feel sleepy for several hours. °· To feel clumsy and have poor balance for several hours. °· To have poor judgment for several hours. °· To vomit if you eat too soon. °Follow these instructions at home: °For at least 24 hours after the procedure: ° °· Do not: °? Participate in activities where you could fall or become injured. °? Drive. °? Use heavy machinery. °? Drink alcohol. °? Take sleeping pills or medicines that cause drowsiness. °? Make important decisions or sign legal documents. °? Take care of children on your own. °· Rest. °Eating and drinking °· Follow the diet recommended by your health care provider. °· If you vomit: °? Drink water, juice, or soup when you can drink without vomiting. °? Make sure you have little or no nausea before eating solid foods. °General instructions °· Have a responsible adult stay with you until you are awake and alert. °· Take over-the-counter and prescription medicines only as told by your health care provider. °· If you smoke, do not smoke without supervision. °· Keep all follow-up visits as told by your health care provider. This is important. °Contact a health care provider if: °· You keep feeling nauseous or you keep vomiting. °· You feel light-headed. °· You develop a rash. °· You have a fever. °Get help right away if: °· You have trouble breathing. °This information is not intended to replace advice given to you by your health care provider. Make sure you discuss any questions you have  with your health care provider. °Document Released: 04/10/2013 Document Revised: 11/23/2015 Document Reviewed: 10/10/2015 °Elsevier Interactive Patient Education © 2019 Elsevier Inc. ° °

## 2018-12-27 NOTE — Consult Note (Signed)
Chief Complaint: Patient was seen in consultation today for bacteremia  Referring Physician(s): Dr. Reesa Chew  Supervising Physician: Arne Cleveland  Patient Status: [facility] - [facility]-pt  History of Present Illness: Cory Kelly is a 63 y.o. male with past medical history of HTN, pancreatic cancer who presented to [facility] ED with fever, chills.  Patient is found to have bacteremia with blood cultures positive for strep.  Patient has been receiving chemotherapy for pancreatic cancer via Port-A-Cath placed 08/30/2017 by Dr. Pascal Lux.  Request is made for removal of Port-A-Cath due to persistent blood stream infection.  Patient receives weekly chemotherapy.  He will need peripheral access for ongoing treatment.  Past Medical History:  Diagnosis Date   Abdominal pain    due to bloating   Bloating    Cancer (HCC)    skin cancer   Essential hypertension    Fatigue    History of kidney stones    passed   History of weight loss    HOH (hard of hearing)    no hearing aids   Neuralgia    Pancreatic cancer [facility])    Stress    loss of father in Dec 2018.    Past Surgical History:  Procedure Laterality Date   APPENDECTOMY     BILIARY STENT PLACEMENT N/A 06/25/2018   Procedure: BILIARY STENT PLACEMENT;  Surgeon: Irving Copas., MD;  Location: [facility] ENDOSCOPY;  Service: Gastroenterology;  Laterality: N/A;   BILIARY STENT PLACEMENT  07/20/2018   Procedure: BILIARY STENT PLACEMENT;  Surgeon: Milus Banister, MD;  Location: [facility] ENDOSCOPY;  Service: Gastroenterology;;   BILIARY STENT PLACEMENT N/A 08/16/2018   Procedure: BILIARY STENT PLACEMENT;  Surgeon: Milus Banister, MD;  Location: [facility] ENDOSCOPY;  Service: Endoscopy;  Laterality: N/A;   ENDOSCOPIC RETROGRADE CHOLANGIOPANCREATOGRAPHY (ERCP) WITH PROPOFOL N/A 08/10/2017   Procedure: ENDOSCOPIC RETROGRADE CHOLANGIOPANCREATOGRAPHY (ERCP) WITH PROPOFOL;  Surgeon: Milus Banister, MD;  Location: [facility] ENDOSCOPY;  Service: Endoscopy;   Laterality: N/A;   ENDOSCOPIC RETROGRADE CHOLANGIOPANCREATOGRAPHY (ERCP) WITH PROPOFOL N/A 07/20/2018   Procedure: ENDOSCOPIC RETROGRADE CHOLANGIOPANCREATOGRAPHY (ERCP) WITH PROPOFOL;  Surgeon: Milus Banister, MD;  Location: [facility] ENDOSCOPY;  Service: Gastroenterology;  Laterality: N/A;  Balloon Sweeps   ENDOSCOPIC RETROGRADE CHOLANGIOPANCREATOGRAPHY (ERCP) WITH PROPOFOL N/A 08/16/2018   Procedure: ENDOSCOPIC RETROGRADE CHOLANGIOPANCREATOGRAPHY (ERCP) WITH PROPOFOL;  Surgeon: Milus Banister, MD;  Location: [facility] ENDOSCOPY;  Service: Endoscopy;  Laterality: N/A;   ENDOSCOPIC RETROGRADE CHOLANGIOPANCREATOGRAPHY (ERCP) WITH PROPOFOL N/A 12/06/2018   Procedure: ENDOSCOPIC RETROGRADE CHOLANGIOPANCREATOGRAPHY (ERCP) WITH PROPOFOL;  Surgeon: Milus Banister, MD;  Location: [facility] ENDOSCOPY;  Service: Endoscopy;  Laterality: N/A;  cbd stent sweep   ERCP N/A 06/25/2018   Procedure: ENDOSCOPIC RETROGRADE CHOLANGIOPANCREATOGRAPHY (ERCP);  Surgeon: Irving Copas., MD;  Location: [facility];  Service: Gastroenterology;  Laterality: N/A;   EUS N/A 08/10/2017   Procedure: UPPER ENDOSCOPIC ULTRASOUND (EUS) RADIAL;  Surgeon: Milus Banister, MD;  Location: [facility] ENDOSCOPY;  Service: Endoscopy;  Laterality: N/A;   EYE SURGERY     lasik/left eye   IR FLUORO GUIDE PORT INSERTION RIGHT  08/30/2017   IR US GUIDE VASC ACCESS RIGHT  08/30/2017   REMOVAL OF STONES  06/25/2018   Procedure: REMOVAL OF STONES;  Surgeon: Irving Copas., MD;  Location: [facility];  Service: Gastroenterology;;   SKIN CANCER EXCISION     STENT REMOVAL  07/20/2018   Procedure: STENT REMOVAL;  Surgeon: Milus Banister, MD;  Location: [place];  Service: Gastroenterology;;   Lavell Islam REMOVAL  08/16/2018  Procedure: STENT REMOVAL;  Surgeon: Milus Banister, MD;  Location: [facility] ENDOSCOPY;  Service: Endoscopy;;   TEE WITHOUT CARDIOVERSION N/A 11/08/2018   Procedure: TRANSESOPHAGEAL ECHOCARDIOGRAM (TEE);  Surgeon: Acie Fredrickson Wonda Cheng, MD;  Location: [facility] ENDOSCOPY;  Service: Cardiovascular;  Laterality: N/A;    Allergies: Patient has no known allergies.  Medications: Prior to Admission medications   Medication Sig Start Date End Date Taking? Authorizing Provider  amLODipine (NORVASC) 10 MG tablet Take 10 mg by mouth at bedtime.  05/21/18  Yes [provider]  ciprofloxacin (CIPRO) 500 MG tablet Take 1 tablet (500 mg total) by mouth 2 (two) times daily. 12/17/18  Yes Milton Ferguson, MD  doxazosin (CARDURA) 1 MG tablet Take 1 mg by mouth at bedtime.   Yes [provider]  gabapentin (NEURONTIN) 100 MG capsule Take 2 capsules (200 mg total) by mouth 2 (two) times daily. 12/03/18  Yes Owens Shark, NP  lidocaine-prilocaine (EMLA) cream Apply 1 application topically as needed. Apply to portacath site 1-2 hours prior to use Patient taking differently: Apply 1 application topically daily as needed (1-2 hours prior to portacath access).  08/20/18  Yes Owens Shark, NP  loperamide (IMODIUM A-D) 2 MG tablet Take 1 tablet (2 mg total) by mouth as directed. Patient taking differently: Take 2-4 mg by mouth 3 (three) times daily as needed for diarrhea or loose stools.  08/22/18  Yes Ladell Pier, MD  olmesartan (BENICAR) 40 MG tablet Take 1 tablet (40 mg total) by mouth daily. 04/04/18  Yes Weber, Sarah L, PA-C  ondansetron (ZOFRAN) 4 MG tablet Take 1 tablet (4 mg total) by mouth every 6 (six) hours as needed for nausea. 06/25/18  Yes Emokpae, Courage, MD  oxyCODONE-acetaminophen (PERCOCET) 10-325 MG tablet Take 1 tablet by mouth every 4 (four) hours as needed for pain. 12/03/18  Yes Owens Shark, NP  potassium chloride SA (K-DUR,KLOR-CON) 20 MEQ tablet Take 1 tablet (20 mEq total) by mouth 2 (two) times daily. 10/16/18  Yes Ladell Pier, MD  [facility] ACETATE EX Apply 1 application topically daily as needed (itching).   Yes [provider]  prochlorperazine (COMPAZINE) 10 MG tablet Take 10 mg by  mouth every 6 (six) hours as needed for nausea or vomiting.   Yes [provider]  ondansetron (ZOFRAN ODT) 4 MG disintegrating tablet 4mg  ODT q4 hours prn nausea/vomit Patient not taking: Reported on 12/24/2018 12/17/18   Milton Ferguson, MD     Family History  Problem Relation Age of Onset   Hyperlipidemia Father    Heart disease Father    Dementia Father    Memory loss Mother    Colon cancer Neg Hx    Esophageal cancer Neg Hx    Inflammatory bowel disease Neg Hx    Liver disease Neg Hx    Rectal cancer Neg Hx    Stomach cancer Neg Hx    Pancreatic cancer Neg Hx     Social History   Socioeconomic History   Marital status: Married    Spouse name: Not on file   Number of children: Not on file   Years of education: Not on file   Highest education level: Not on file  Occupational History    Employer: [employer] Needs   Financial resource strain: Not on file   Food insecurity    Worry: Not on file    Inability: Not on file   Transportation needs    Medical: Not on  file    Non-medical: Not on file  Tobacco Use   Smoking status: Never Smoker   Smokeless tobacco: Never Used  Substance and Sexual Activity   Alcohol use: No   Drug use: No   Sexual activity: Yes    Partners: Female    Birth control/protection: None  Lifestyle   Physical activity    Days per week: Not on file    Minutes per session: Not on file   Stress: Not on file  Relationships   Social connections    Talks on phone: Not on file    Gets together: Not on file    Attends religious service: Not on file    Active member of club or organization: Not on file    Attends meetings of clubs or organizations: Not on file    Relationship status: Not on file  Other Topics Concern   Not on file  Social History Narrative   Married   2 children, 2 grand children   Coaches womens basketball at [employer]: A 12 point ROS  discussed and pertinent positives are indicated in the HPI above.  All other systems are negative.  Review of Systems  Constitutional: Positive for chills and fatigue. Negative for fever.  Respiratory: Negative for cough and shortness of breath.   Cardiovascular: Negative for chest pain.  Gastrointestinal: Negative for abdominal pain.  Musculoskeletal: Negative for back pain.  Psychiatric/Behavioral: Negative for behavioral problems and confusion.    Vital Signs: BP 123/75 (BP Location: Right Arm)    Pulse 60    Temp 98.1 F (36.7 C) (Oral)    Resp 12    Ht 6' 5.01" (1.956 m)    Wt 195 lb 5.2 oz (88.6 kg)    SpO2 98%    BMI 23.16 kg/m   Physical Exam Vitals signs and nursing note reviewed.  Constitutional:      Appearance: Normal appearance.  HENT:     Mouth/Throat:     Mouth: Mucous membranes are moist.     Pharynx: Oropharynx is clear.  Neck:     Musculoskeletal: Normal range of motion and neck supple.  Cardiovascular:     Rate and Rhythm: Normal rate and regular rhythm.     Heart sounds: No murmur. No friction rub. No gallop.   Pulmonary:     Effort: Pulmonary effort is normal.     Breath sounds: Normal breath sounds.  Skin:    General: Skin is warm and dry.     Comments: rigth Port-A-Cath in place. Currently accessed.  No erythema, warmth, purulent drainage.  Neurological:     General: No focal deficit present.     Mental Status: He is alert and oriented to person, place, and time. Mental status is at baseline.  Psychiatric:        Mood and Affect: Mood normal.        Behavior: Behavior normal.        Thought Content: Thought content normal.        Judgment: Judgment normal.      MD Evaluation Airway: WNL Heart: WNL Abdomen: WNL Chest/ Lungs: WNL ASA  Classification: 3 Mallampati/Airway Score: One   Imaging: Mr Abdomen W Wo Contrast  Result Date: 12/04/2018 CLINICAL DATA:  Follow-up pancreas cancer. Assess for worsening of disease. EXAM: MRI ABDOMEN  WITHOUT AND WITH CONTRAST TECHNIQUE: Multiplanar multisequence MR imaging of the abdomen was performed both before and after the administration of intravenous  contrast. CONTRAST:  10 cc Gadavist COMPARISON:  11/06/2018 FINDINGS: Lower chest: Tiny bilateral pleural effusions are identified, similar to previous exam. Hepatobiliary: Multiple liver metastases are again noted. Index lesion within segment 5 measures 1.7 by 1.3 cm, image 44/1003. Previously 1.7 x 1.5 cm. The index lesion within segment 2 measures 1.0 by 0.8 cm, image 29/1003. Previously 1.2 x 0.8 cm. New lesion within segment 4 a measures 1 by 0.9 cm, image 31/1002. The remainder of the liver metastasis are relatively stable in the interval. Gallbladder unremarkable. Common bile duct stent in place without intrahepatic bile duct dilatation. Pancreas: The soft tissue mass within the head of pancreas measures approximately 3.7 x 4.1 cm, image 59/1001. Previously 3.5 x 3.1 cm. Diffuse atrophy and pancreatic ductal dilatation is again noted within the body and tail. Encasement and obstruction of the portal venous confluence is again identified with distal reconstitution of the portal vein via collaterals. Spleen: Splenomegaly is again noted. The spleen has a cranial caudal dimension 20.1 cm. Similar. Adrenals/Urinary Tract: Normal appearance of the adrenal glands. No kidney mass or hydronephrosis. Simple appearing inferior pole right kidney cyst measures 2.7 cm. Stomach/Bowel: Visualized portions within the abdomen are unremarkable. Vascular/Lymphatic: No evidence for abdominal aortic aneurysm. Esophageal and splenic varices as well as portosystemic venous collaterals compatible with portal venous hypertension. No adenopathy identified. Other:  Interval increase in volume of abdominal ascites. Musculoskeletal: No suspicious bone lesions identified. IMPRESSION: 1. There is been mild increase in size of mass involving head of pancreas. 2. Diffuse hepatic  metastasis. The previous index lesions are unchanged in size compared with previous exam. A single new metastasis is identified within the medial segment of left lobe of liver. 3. 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 Abdominal ascites has increased in the interval. Electronically Signed   By: Kerby Moors M.D.   On: 12/04/2018 11:27   Ct Abdomen Pelvis W Contrast  Result Date: 12/24/2018 CLINICAL DATA:  Abdominal distension, fever, history of pancreatic cancer EXAM: CT ABDOMEN AND PELVIS WITH CONTRAST TECHNIQUE: Multidetector CT imaging of the abdomen and pelvis was performed using the standard protocol following bolus administration of intravenous contrast. CONTRAST:  124mL OMNIPAQUE IOHEXOL 300 MG/ML  SOLN COMPARISON:  MR abdomen pelvis, 12/04/2018, 10/30/2018 FINDINGS: Lower chest: No acute abnormality. Hepatobiliary: Known liver metastases are poorly appreciated by single-phase CT, for example in the dome of the liver (series 2, image 14). No gallstones or gallbladder wall thickening. Common bile duct stent is positioned tip in the descending portion of the duodenum with post stenting pneumobilia. Pancreas: Discrete pancreatic head mass is not well appreciated, primarily appreciated by mass effect on the confluence of the portal vein (series 2, image 38. There is atrophy of the pancreatic body and tail and dilatation pancreatic duct. Spleen: Splenomegaly. Adrenals/Urinary Tract: Adrenal glands are unremarkable. Nonobstructive bilateral nephrolithiasis. Bladder is unremarkable. Stomach/Bowel: Stomach is within normal limits. Appendix is not clearly visualized. No evidence of bowel wall thickening, distention, or inflammatory changes. Large burden of stool in the distal colon and rectum. Vascular/Lymphatic: Aortic atherosclerosis. No enlarged abdominal or pelvic lymph nodes. Reproductive: No mass or other significant abnormality. Other: No abdominal wall hernia or  abnormality. Moderate volume ascites. Musculoskeletal: No acute or significant osseous findings. IMPRESSION: 1. Redemonstrated pancreatic malignancy with hepatic metastases, status post common bile duct stenting. Primary mass and hepatic metastases are better appreciated by recent multiphasic MRI. 2.  Moderate volume ascites, which is similar to prior MRI. 3.  Splenomegaly. 4.  Large burden of stool in the distal colon and rectum. 5.  Nonobstructive bilateral nephrolithiasis. Electronically Signed   By: Eddie Candle M.D.   On: 12/24/2018 14:35   Mr 3d Recon At Scanner  Result Date: 12/04/2018 CLINICAL DATA:  Follow-up pancreas cancer. Assess for worsening of disease. EXAM: MRI ABDOMEN WITHOUT AND WITH CONTRAST TECHNIQUE: Multiplanar multisequence MR imaging of the abdomen was performed both before and after the administration of intravenous contrast. CONTRAST:  10 cc Gadavist COMPARISON:  11/06/2018 FINDINGS: Lower chest: Tiny bilateral pleural effusions are identified, similar to previous exam. Hepatobiliary: Multiple liver metastases are again noted. Index lesion within segment 5 measures 1.7 by 1.3 cm, image 44/1003. Previously 1.7 x 1.5 cm. The index lesion within segment 2 measures 1.0 by 0.8 cm, image 29/1003. Previously 1.2 x 0.8 cm. New lesion within segment 4 a measures 1 by 0.9 cm, image 31/1002. The remainder of the liver metastasis are relatively stable in the interval. Gallbladder unremarkable. Common bile duct stent in place without intrahepatic bile duct dilatation. Pancreas: The soft tissue mass within the head of pancreas measures approximately 3.7 x 4.1 cm, image 59/1001. Previously 3.5 x 3.1 cm. Diffuse atrophy and pancreatic ductal dilatation is again noted within the body and tail. Encasement and obstruction of the portal venous confluence is again identified with distal reconstitution of the portal vein via collaterals. Spleen: Splenomegaly is again noted. The spleen has a cranial caudal  dimension 20.1 cm. Similar. Adrenals/Urinary Tract: Normal appearance of the adrenal glands. No kidney mass or hydronephrosis. Simple appearing inferior pole right kidney cyst measures 2.7 cm. Stomach/Bowel: Visualized portions within the abdomen are unremarkable. Vascular/Lymphatic: No evidence for abdominal aortic aneurysm. Esophageal and splenic varices as well as portosystemic venous collaterals compatible with portal venous hypertension. No adenopathy identified. Other:  Interval increase in volume of abdominal ascites. Musculoskeletal: No suspicious bone lesions identified. IMPRESSION: 1. There is been mild increase in size of mass involving head of pancreas. 2. Diffuse hepatic metastasis. The previous index lesions are unchanged in size compared with previous exam. A single new metastasis is identified within the medial segment of left lobe of liver. 3. 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 Abdominal ascites has increased in the interval. Electronically Signed   By: Kerby Moors M.D.   On: 12/04/2018 11:27   Dg Chest Portable 1 View  Result Date: 12/24/2018 CLINICAL DATA:  Fever and weakness. EXAM: PORTABLE CHEST 1 VIEW COMPARISON:  11/04/2018 FINDINGS: The cardiac silhouette, mediastinal and hilar contours are stable. Mild cardiac enlargement and mild tortuosity of the thoracic aorta. The right IJ power port is stable. The lungs are clear. No pleural effusions. The bony thorax is intact. IMPRESSION: No acute cardiopulmonary findings. Electronically Signed   By: Marijo Sanes M.D.   On: 12/24/2018 13:59   Dg Ercp  Result Date: 12/06/2018 CLINICAL DATA:  63 year old male with a history of pancreatic cancer and obstructed jaundice with prior stent placement and progressive liver enzyme elevation. EXAM: ERCP TECHNIQUE: Multiple spot images obtained with the fluoroscopic device and submitted for interpretation post-procedure. FLUOROSCOPY TIME:  Fluoroscopy Time:  3  minutes 43 seconds Radiation Exposure Index (if provided by the fluoroscopic device): 66.7 mGy COMPARISON:  MRCP 12/04/2018 FINDINGS: A total of 4 intraoperative saved images are submitted for review. The images demonstrate a metal stent in the common hepatic and common bile ducts with a flexible endoscope in the descending duodenum. Subsequent images demonstrate wire cannulation of the stent and balloon dilation.  IMPRESSION: ERCP with cannulation of the previously placed metal stent and balloon dilatation. These images were submitted for radiologic interpretation only. Please see the procedural report for the amount of contrast and the fluoroscopy time utilized. Electronically Signed   By: Jacqulynn Cadet M.D.   On: 12/06/2018 11:51   Ir Abdomen US Limited  Result Date: 11/29/2018 CLINICAL DATA:  Metastatic pancreas adenocarcinoma, abdominal distension assess for ascites EXAM: LIMITED ABDOMEN ULTRASOUND FOR ASCITES TECHNIQUE: Limited ultrasound survey for ascites was performed in all four abdominal quadrants. COMPARISON:  None. FINDINGS: Ultrasound survey of the abdominal 4 quadrants demonstrates trace amount of lower abdominopelvic ascites. Not enough to warrant therapeutic paracentesis. Procedure not performed. IMPRESSION: Trace amount of abdominopelvic ascites by ultrasound. Procedure not performed. Electronically Signed   By: Jerilynn Mages.  Shick M.D.   On: 11/29/2018 13:25   [place] Ascites (abdomen Limited)  Result Date: 12/25/2018 CLINICAL DATA:  63 year old male with abdominal distension, ascites EXAM: LIMITED ABDOMEN ULTRASOUND FOR ASCITES TECHNIQUE: Limited ultrasound survey for ascites was performed in all four abdominal quadrants. COMPARISON:  Prior CT scan of the abdomen and pelvis 12/24/2018 FINDINGS: Sonographic interrogation was performed of the 4 quadrants of the abdomen. There is trace perihepatic fluid in the right upper quadrant with multiple small loops of bowel. Overall, fluid volume is low. No safe  window for paracentesis. IMPRESSION: Small volume ascites, insufficient for paracentesis. Electronically Signed   By: Jacqulynn Cadet M.D.   On: 12/25/2018 12:13    Labs:  CBC: Recent Labs    12/24/18 1123 12/25/18 0725 12/26/18 0555 12/27/18 0652  WBC 9.3 2.3* 2.6* 2.2*  HGB 12.0* 9.2* 9.3* 9.2*  HCT 37.0* 28.4* 28.9* 27.8*  PLT 169 60* 78* 90*    COAGS: Recent Labs    05/21/18 1250 06/24/18 0529 11/04/18 1545  INR 1.00 0.90 1.4*  APTT  --   --  33    BMP: Recent Labs    12/24/18 1205 12/25/18 0725 12/26/18 0555 12/27/18 0652  NA 135 135 133* 135  K 4.8 3.6 3.2* 3.4*  CL 100 103 102 105  CO2 20* 23 26 23   GLUCOSE 132* 104* 112* 114*  BUN 6* 7* 6* 5*  CALCIUM 8.9 7.9* 7.8* 7.7*  CREATININE 0.94 0.71 0.72 0.68  GFRNONAA >60 >60 >60 >60  GFRAA >60 >60 >60 >60    LIVER FUNCTION TESTS: Recent Labs    12/03/18 0851 12/10/18 1042 12/24/18 1205 12/27/18 0652  BILITOT 0.9 0.9 0.8 0.4  AST 276* 47* 40 28  ALT 367* 83* 73* 56*  ALKPHOS 1,261* 818* 754* 531*  PROT 5.7* 5.7* 6.3* 4.9*  ALBUMIN 2.8* 3.0* 3.0* 2.5*    TUMOR MARKERS: No results for input(s): AFPTM, CEA, CA199, CHROMGRNA in the last 8760 hours.  Assessment and Plan: Bacteremia Patient with Port-A-Cath placed in 2019 for ongoing treatment of his pancreatic cancer.  He has had recurrent infections, admitted to [facility] now with bacteremia which may be of GI source.  ID has recommended Port-A-Cath removal.  Patient will need IV access for home health as well as ongoing treatment in the interim before Port-A-Cath can be replaced.  Plan to proceed today with Port-A-Cath removal as well as PICC placement.   Patient has been NPO.  His lovenox has been held.   Risks and benefits were discussed with the patient including, but not limited to bleeding, infection, need for additional procedures.  All of the patient's questions were answered, patient is agreeable to proceed. Consent signed and in  chart.  Thank you for this interesting consult.  I greatly enjoyed meeting Shakai Dolley and look forward to participating in their care.  A copy of this report was sent to the requesting provider on this date.  Electronically Signed: Docia Barrier, PA 12/27/2018, 10:30 AM   I spent a total of 40 Minutes    in face to face in clinical consultation, greater than 50% of which was counseling/coordinating care for bacteremia.

## 2018-12-27 NOTE — Sedation Documentation (Signed)
PICC placement completed. Pt prepped for Port a Cath removal.

## 2018-12-27 NOTE — Sedation Documentation (Signed)
Port a Cath placement procedure begun.

## 2018-12-27 NOTE — Progress Notes (Signed)
PHARMACY - PHYSICIAN COMMUNICATION CRITICAL VALUE ALERT - BLOOD CULTURE IDENTIFICATION (BCID)  Cory Kelly is an 63 y.o. male who presented to [facility] on 12/24/2018 with a chief complaint of fever and AMS  Assessment:  2nd tube from [place] now +GNR (1/2).  Cx drawn after admission since NGTD.  Name of physician (or Provider) ContactedReesa Chew  Current antibiotics: Rocephin  Changes to prescribed antibiotics recommended: continue current abx Patient is on recommended antibiotics - No changes needed  Results for orders placed or performed during the hospital encounter of 12/24/18  Blood Culture ID Panel (Reflexed) (Collected: 12/24/2018 12:27 PM)  Result Value Ref Range   Enterococcus species NOT DETECTED NOT DETECTED   Listeria monocytogenes NOT DETECTED NOT DETECTED   Staphylococcus species NOT DETECTED NOT DETECTED   Staphylococcus aureus (BCID) NOT DETECTED NOT DETECTED   Streptococcus species NOT DETECTED NOT DETECTED   Streptococcus agalactiae NOT DETECTED NOT DETECTED   Streptococcus pneumoniae NOT DETECTED NOT DETECTED   Streptococcus pyogenes NOT DETECTED NOT DETECTED   Acinetobacter baumannii NOT DETECTED NOT DETECTED   Enterobacteriaceae species NOT DETECTED NOT DETECTED   Enterobacter cloacae complex NOT DETECTED NOT DETECTED   Escherichia coli NOT DETECTED NOT DETECTED   Klebsiella oxytoca NOT DETECTED NOT DETECTED   Klebsiella pneumoniae NOT DETECTED NOT DETECTED   Proteus species NOT DETECTED NOT DETECTED   Serratia marcescens NOT DETECTED NOT DETECTED   Haemophilus influenzae NOT DETECTED NOT DETECTED   Neisseria meningitidis NOT DETECTED NOT DETECTED   Pseudomonas aeruginosa NOT DETECTED NOT DETECTED   Candida albicans NOT DETECTED NOT DETECTED   Candida glabrata NOT DETECTED NOT DETECTED   Candida krusei NOT DETECTED NOT DETECTED   Candida parapsilosis NOT DETECTED NOT DETECTED   Candida tropicalis NOT DETECTED NOT DETECTED    Biagio Borg 12/27/2018  12:04 PM

## 2018-12-27 NOTE — Progress Notes (Signed)
HEMATOLOGY-ONCOLOGY PROGRESS NOTE  SUBJECTIVE: No further fever or chills.  He wants to go home.  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[place])   08/31/2017 - 11/22/2017 Chemotherapy   The patient had palonosetron (ALOXI) injection 0.25 mg, 0.25 mg, Intravenous,  Once, 6 of 6 cycles Administration: 0.25 mg (08/31/2017), 0.25 mg (09/14/2017), 0.25 mg (09/28/2017), 0.25 mg (10/12/2017), 0.25 mg (10/26/2017), 0.25 mg (11/09/2017) pegfilgrastim-cbqv (UDENYCA) injection 6 mg, 6 mg, Subcutaneous, Once, 6 of 6 cycles Administration: 6 mg (09/02/2017), 6 mg (09/16/2017), 6 mg (09/30/2017), 6 mg (10/14/2017), 6 mg (10/28/2017), 6 mg (11/11/2017) irinotecan (CAMPTOSAR) 360 mg in sodium chloride 0.9 % 500 mL chemo infusion, 150 mg/m2 = 360 mg (100 % of original dose 150 mg/m2), Intravenous,  Once, 6 of 6 cycles Dose modification: 150 mg/m2 (original dose 150 mg/m2, Cycle 1, Reason: Provider Judgment) Administration: 360 mg (08/31/2017), 360 mg (09/14/2017), 360 mg (09/28/2017), 340 mg (10/12/2017), 340 mg (10/26/2017), 340 mg (11/09/2017) leucovorin 952 mg in sodium chloride 0.9 % 250 mL infusion, 400 mg/m2 = 952 mg, Intravenous,  Once, 6 of 6 cycles Administration: 952 mg (08/31/2017), 952 mg (09/14/2017), 952 mg (09/28/2017), 888 mg (10/12/2017), 888 mg (10/26/2017), 888 mg (11/09/2017) oxaliplatin (ELOXATIN) 200 mg in dextrose 5 % 500 mL chemo infusion, 85 mg/m2 = 200 mg, Intravenous,  Once, 6 of 6 cycles Dose modification: 65 mg/m2 (original dose 85 mg/m2, Cycle 4, Reason: Provider Judgment) Administration: 200 mg (08/31/2017), 200 mg (09/14/2017), 200 mg (09/28/2017), 145 mg (10/12/2017), 150 mg (10/26/2017), 150 mg (11/09/2017) fosaprepitant (EMEND) 150 mg, dexamethasone (DECADRON) 12 mg in sodium chloride 0.9 % 145 mL IVPB, , Intravenous,  Once, 6 of 6 cycles Administration:  (08/31/2017),  (09/14/2017),  (09/28/2017),  (10/12/2017),  (10/26/2017),   (11/09/2017) fluorouracil (ADRUCIL) 5,700 mg in sodium chloride 0.9 % 136 mL chemo infusion, 2,400 mg/m2 = 5,700 mg, Intravenous, 1 Day/Dose, 6 of 6 cycles Administration: 5,700 mg (08/31/2017), 5,700 mg (09/14/2017), 5,700 mg (09/28/2017), 5,350 mg (10/12/2017), 5,350 mg (10/26/2017), 5,350 mg (11/09/2017)  for chemotherapy treatment.    12/06/2017 - 02/28/2018 Chemotherapy   The patient had gemcitabine (GEMZAR) 1,786 mg in sodium chloride 0.9 % 250 mL chemo infusion, 800 mg/m2 = 1,786 mg (80 % of original dose 1,000 mg/m2), Intravenous,  Once, 4 of 4 cycles Dose modification: 800 mg/m2 (original dose 1,000 mg/m2, Cycle 1, Reason: Provider Judgment), 800 mg/m2 (original dose 1,000 mg/m2, Cycle 1, Reason: Provider Judgment, Comment: call from MD to decrease dose due to counts) Administration: 1,786 mg (12/20/2017), 1,786 mg (12/06/2017), 1,786 mg (01/03/2018), 1,786 mg (01/17/2018), 1,786 mg (01/31/2018), 1,786 mg (02/14/2018), 1,786 mg (02/28/2018) PACLitaxel-protein bound (ABRAXANE) chemo infusion 225 mg, 100 mg/m2 = 225 mg (80 % of original dose 125 mg/m2), Intravenous, Once, 3 of 3 cycles Dose modification: 100 mg/m2 (original dose 125 mg/m2, Cycle 1, Reason: Provider Judgment), 100 mg/m2 (original dose 125 mg/m2, Cycle 1, Reason: Provider Judgment, Comment: call from MD to decrease dose based on counts) Administration: 225 mg (12/20/2017), 225 mg (12/06/2017), 225 mg (01/03/2018), 225 mg (01/17/2018)  for chemotherapy treatment.    07/04/2018 Cancer Staging   Staging form: Exocrine Pancreas, AJCC 8th Edition - Pathologic: Stage IV (cM1) - Signed by Ladell Pier, MD on 07/04/2018   08/21/2018 -  Chemotherapy   The patient had palonosetron (ALOXI) injection 0.25 mg, 0.25 mg, Intravenous,  Once, 7 of 8 cycles Administration: 0.25 mg (08/21/2018), 0.25 mg (  09/03/2018), 0.25 mg (09/18/2018), 0.25 mg (10/01/2018), 0.25 mg (10/16/2018), 0.25 mg (11/14/2018), 0.25 mg (12/10/2018) pegfilgrastim-cbqv (UDENYCA) injection 6 mg, 6 mg,  Subcutaneous, Once, 7 of 8 cycles Administration: 6 mg (08/23/2018), 6 mg (09/05/2018), 6 mg (09/20/2018), 6 mg (10/03/2018), 6 mg (10/18/2018), 6 mg (11/16/2018) irinotecan (CAMPTOSAR) 360 mg in dextrose 5 % 500 mL chemo infusion, 150 mg/m2 = 360 mg (100 % of original dose 150 mg/m2), Intravenous,  Once, 7 of 8 cycles Dose modification: 150 mg/m2 (original dose 150 mg/m2, Cycle 1, Reason: Provider Judgment), 112.5 mg/m2 (original dose 150 mg/m2, Cycle 1, Reason: Change in LFTs), 90 mg/m2 (original dose 150 mg/m2, Cycle 6, Reason: Provider Judgment), 90 mg/m2 (original dose 90 mg/m2, Cycle 7) Administration: 260 mg (08/21/2018), 260 mg (09/03/2018), 260 mg (09/18/2018), 260 mg (10/01/2018), 200 mg (11/14/2018), 200 mg (10/16/2018), 200 mg (12/10/2018) leucovorin 952 mg in dextrose 5 % 250 mL infusion, 400 mg/m2 = 952 mg, Intravenous,  Once, 7 of 8 cycles Dose modification: 300 mg/m2 (original dose 400 mg/m2, Cycle 1, Reason: Provider Judgment), 300 mg/m2 (original dose 300 mg/m2, Cycle 7) Administration: 696 mg (08/21/2018), 696 mg (09/03/2018), 696 mg (09/18/2018), 676 mg (10/01/2018), 676 mg (11/14/2018), 676 mg (10/16/2018), 676 mg (12/10/2018) oxaliplatin (ELOXATIN) 155 mg in dextrose 5 % 500 mL chemo infusion, 65 mg/m2 = 155 mg (100 % of original dose 65 mg/m2), Intravenous,  Once, 7 of 8 cycles Dose modification: 65 mg/m2 (original dose 65 mg/m2, Cycle 1, Reason: Provider Judgment) Administration: 150 mg (08/21/2018), 150 mg (09/03/2018), 150 mg (09/18/2018), 150 mg (10/01/2018), 150 mg (10/16/2018), 150 mg (11/14/2018), 150 mg (12/10/2018) fosaprepitant (EMEND) 150 mg, dexamethasone (DECADRON) 12 mg in sodium chloride 0.9 % 145 mL IVPB, , Intravenous,  Once, 7 of 8 cycles Administration:  (08/21/2018),  (09/03/2018),  (09/18/2018),  (10/01/2018),  (10/16/2018),  (11/14/2018),  (12/10/2018) fluorouracil (ADRUCIL) 5,700 mg in sodium chloride 0.9 % 136 mL chemo infusion, 2,400 mg/m2 = 5,700 mg, Intravenous, 1 Day/Dose, 7 of 8 cycles Dose  modification: 1,800 mg/m2 (original dose 2,400 mg/m2, Cycle 1, Reason: Change in LFTs) Administration: 4,200 mg (08/21/2018), 4,200 mg (09/03/2018), 4,200 mg (09/18/2018), 4,050 mg (10/01/2018), 4,050 mg (10/16/2018), 4,050 mg (11/14/2018), 4,050 mg (12/10/2018)  for chemotherapy treatment.       REVIEW OF SYSTEMS:   Constitutional: Fever and chills have resolved. Eyes: Denies blurriness of vision Ears, nose, mouth, throat, and face: Denies mucositis or sore throat Respiratory: Denies cough, dyspnea or wheezes Cardiovascular: Denies palpitation, chest discomfort Gastrointestinal: No abdominal pain, nausea, vomiting, constipation, diarrhea. Skin: Denies abnormal skin rashes Lymphatics: Denies new lymphadenopathy or easy bruising Neurological:Denies numbness, tingling or new weaknesses Behavioral/Psych: Mood is stable, no new changes  Extremities: No lower extremity edema All other systems were reviewed with the patient and are negative.  I have reviewed the past medical history, past surgical history, social history and family history with the patient and they are unchanged from previous note.   PHYSICAL EXAMINATION:  Vitals:   12/27/18 0525 12/27/18 1313  BP: 123/75 (!) 152/78  Pulse: 60 (!) 54  Resp: 12 13  Temp: 98.1 F (36.7 C)   SpO2: 98% 98%   Filed Weights   12/24/18 1630  Weight: 195 lb 5.2 oz (88.6 kg)    Intake/Output from previous day: 06/24 0701 - 06/25 0700 In: 240 [P.O.:240] Out: -   GENERAL:alert, no distress and comfortable SKIN: skin color, texture, turgor are normal, no rashes or significant lesions OROPHARYNX:no exudate, no erythema and lips, buccal mucosa, and  tongue normal  NECK: supple, thyroid normal size, non-tender, without nodularity LYMPH:  no palpable lymphadenopathy in the cervical, axillary or inguinal LUNGS: clear to auscultation and percussion with normal breathing effort HEART: regular rate & rhythm and no murmurs and no lower extremity  edema ABDOMEN: Positive bowel sounds.  Minimal ascites noted.  No abdominal pain with palpation. Musculoskeletal:no cyanosis of digits and no clubbing  NEURO: alert & oriented x 3 with fluent speech, no focal motor/sensory deficits  LABORATORY DATA:  I have reviewed the data as listed CMP Latest Ref Rng & Units 12/27/2018 12/26/2018 12/25/2018  Glucose 70 - 99 mg/dL 114(H) 112(H) 104(H)  BUN 8 - 23 mg/dL 5(L) 6(L) 7(L)  Creatinine 0.61 - 1.24 mg/dL 0.68 0.72 0.71  Sodium 135 - 145 mmol/L 135 133(L) 135  Potassium 3.5 - 5.1 mmol/L 3.4(L) 3.2(L) 3.6  Chloride 98 - 111 mmol/L 105 102 103  CO2 22 - 32 mmol/L '23 26 23  '$ Calcium 8.9 - 10.3 mg/dL 7.7(L) 7.8(L) 7.9(L)  Total Protein 6.5 - 8.1 g/dL 4.9(L) - -  Total Bilirubin 0.3 - 1.2 mg/dL 0.4 - -  Alkaline Phos 38 - 126 U/L 531(H) - -  AST 15 - 41 U/L 28 - -  ALT 0 - 44 U/L 56(H) - -    Lab Results  Component Value Date   WBC 2.2 (L) 12/27/2018   HGB 9.2 (L) 12/27/2018   HCT 27.8 (L) 12/27/2018   MCV 86.1 12/27/2018   PLT 90 (L) 12/27/2018   NEUTROABS 1.7 12/27/2018    Mr Abdomen W Wo Contrast  Result Date: 12/04/2018 CLINICAL DATA:  Follow-up pancreas cancer. Assess for worsening of disease. EXAM: MRI ABDOMEN WITHOUT AND WITH CONTRAST TECHNIQUE: Multiplanar multisequence MR imaging of the abdomen was performed both before and after the administration of intravenous contrast. CONTRAST:  10 cc Gadavist COMPARISON:  11/06/2018 FINDINGS: Lower chest: Tiny bilateral pleural effusions are identified, similar to previous exam. Hepatobiliary: Multiple liver metastases are again noted. Index lesion within segment 5 measures 1.7 by 1.3 cm, image 44/1003. Previously 1.7 x 1.5 cm. The index lesion within segment 2 measures 1.0 by 0.8 cm, image 29/1003. Previously 1.2 x 0.8 cm. New lesion within segment 4 a measures 1 by 0.9 cm, image 31/1002. The remainder of the liver metastasis are relatively stable in the interval. Gallbladder unremarkable. Common  bile duct stent in place without intrahepatic bile duct dilatation. Pancreas: The soft tissue mass within the head of pancreas measures approximately 3.7 x 4.1 cm, image 59/1001. Previously 3.5 x 3.1 cm. Diffuse atrophy and pancreatic ductal dilatation is again noted within the body and tail. Encasement and obstruction of the portal venous confluence is again identified with distal reconstitution of the portal vein via collaterals. Spleen: Splenomegaly is again noted. The spleen has a cranial caudal dimension 20.1 cm. Similar. Adrenals/Urinary Tract: Normal appearance of the adrenal glands. No kidney mass or hydronephrosis. Simple appearing inferior pole right kidney cyst measures 2.7 cm. Stomach/Bowel: Visualized portions within the abdomen are unremarkable. Vascular/Lymphatic: No evidence for abdominal aortic aneurysm. Esophageal and splenic varices as well as portosystemic venous collaterals compatible with portal venous hypertension. No adenopathy identified. Other:  Interval increase in volume of abdominal ascites. Musculoskeletal: No suspicious bone lesions identified. IMPRESSION: 1. There is been mild increase in size of mass involving head of pancreas. 2. Diffuse hepatic metastasis. The previous index lesions are unchanged in size compared with previous exam. A single new metastasis is identified within the medial segment of  left lobe of liver. 3. 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 Abdominal ascites has increased in the interval. Electronically Signed   By: Kerby Moors M.D.   On: 12/04/2018 11:27   Ct Abdomen Pelvis W Contrast  Result Date: 12/24/2018 CLINICAL DATA:  Abdominal distension, fever, history of pancreatic cancer EXAM: CT ABDOMEN AND PELVIS WITH CONTRAST TECHNIQUE: Multidetector CT imaging of the abdomen and pelvis was performed using the standard protocol following bolus administration of intravenous contrast. CONTRAST:  196m OMNIPAQUE IOHEXOL 300  MG/ML  SOLN COMPARISON:  MR abdomen pelvis, 12/04/2018, 10/30/2018 FINDINGS: Lower chest: No acute abnormality. Hepatobiliary: Known liver metastases are poorly appreciated by single-phase CT, for example in the dome of the liver (series 2, image 14). No gallstones or gallbladder wall thickening. Common bile duct stent is positioned tip in the descending portion of the duodenum with post stenting pneumobilia. Pancreas: Discrete pancreatic head mass is not well appreciated, primarily appreciated by mass effect on the confluence of the portal vein (series 2, image 38. There is atrophy of the pancreatic body and tail and dilatation pancreatic duct. Spleen: Splenomegaly. Adrenals/Urinary Tract: Adrenal glands are unremarkable. Nonobstructive bilateral nephrolithiasis. Bladder is unremarkable. Stomach/Bowel: Stomach is within normal limits. Appendix is not clearly visualized. No evidence of bowel wall thickening, distention, or inflammatory changes. Large burden of stool in the distal colon and rectum. Vascular/Lymphatic: Aortic atherosclerosis. No enlarged abdominal or pelvic lymph nodes. Reproductive: No mass or other significant abnormality. Other: No abdominal wall hernia or abnormality. Moderate volume ascites. Musculoskeletal: No acute or significant osseous findings. IMPRESSION: 1. Redemonstrated pancreatic malignancy with hepatic metastases, status post common bile duct stenting. Primary mass and hepatic metastases are better appreciated by recent multiphasic MRI. 2.  Moderate volume ascites, which is similar to prior MRI. 3.  Splenomegaly. 4.  Large burden of stool in the distal colon and rectum. 5.  Nonobstructive bilateral nephrolithiasis. Electronically Signed   By: AEddie CandleM.D.   On: 12/24/2018 14:35   Mr 3d Recon At Scanner  Result Date: 12/04/2018 CLINICAL DATA:  Follow-up pancreas cancer. Assess for worsening of disease. EXAM: MRI ABDOMEN WITHOUT AND WITH CONTRAST TECHNIQUE: Multiplanar  multisequence MR imaging of the abdomen was performed both before and after the administration of intravenous contrast. CONTRAST:  10 cc Gadavist COMPARISON:  11/06/2018 FINDINGS: Lower chest: Tiny bilateral pleural effusions are identified, similar to previous exam. Hepatobiliary: Multiple liver metastases are again noted. Index lesion within segment 5 measures 1.7 by 1.3 cm, image 44/1003. Previously 1.7 x 1.5 cm. The index lesion within segment 2 measures 1.0 by 0.8 cm, image 29/1003. Previously 1.2 x 0.8 cm. New lesion within segment 4 a measures 1 by 0.9 cm, image 31/1002. The remainder of the liver metastasis are relatively stable in the interval. Gallbladder unremarkable. Common bile duct stent in place without intrahepatic bile duct dilatation. Pancreas: The soft tissue mass within the head of pancreas measures approximately 3.7 x 4.1 cm, image 59/1001. Previously 3.5 x 3.1 cm. Diffuse atrophy and pancreatic ductal dilatation is again noted within the body and tail. Encasement and obstruction of the portal venous confluence is again identified with distal reconstitution of the portal vein via collaterals. Spleen: Splenomegaly is again noted. The spleen has a cranial caudal dimension 20.1 cm. Similar. Adrenals/Urinary Tract: Normal appearance of the adrenal glands. No kidney mass or hydronephrosis. Simple appearing inferior pole right kidney cyst measures 2.7 cm. Stomach/Bowel: Visualized portions within the abdomen are unremarkable. Vascular/Lymphatic: No evidence for abdominal aortic  aneurysm. Esophageal and splenic varices as well as portosystemic venous collaterals compatible with portal venous hypertension. No adenopathy identified. Other:  Interval increase in volume of abdominal ascites. Musculoskeletal: No suspicious bone lesions identified. IMPRESSION: 1. There is been mild increase in size of mass involving head of pancreas. 2. Diffuse hepatic metastasis. The previous index lesions are unchanged in  size compared with previous exam. A single new metastasis is identified within the medial segment of left lobe of liver. 3. 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 Abdominal ascites has increased in the interval. Electronically Signed   By: Kerby Moors M.D.   On: 12/04/2018 11:27   Dg Chest Portable 1 View  Result Date: 12/24/2018 CLINICAL DATA:  Fever and weakness. EXAM: PORTABLE CHEST 1 VIEW COMPARISON:  11/04/2018 FINDINGS: The cardiac silhouette, mediastinal and hilar contours are stable. Mild cardiac enlargement and mild tortuosity of the thoracic aorta. The right IJ power port is stable. The lungs are clear. No pleural effusions. The bony thorax is intact. IMPRESSION: No acute cardiopulmonary findings. Electronically Signed   By: Marijo Sanes M.D.   On: 12/24/2018 13:59   Dg Ercp  Result Date: 12/06/2018 CLINICAL DATA:  63 year old male with a history of pancreatic cancer and obstructed jaundice with prior stent placement and progressive liver enzyme elevation. EXAM: ERCP TECHNIQUE: Multiple spot images obtained with the fluoroscopic device and submitted for interpretation post-procedure. FLUOROSCOPY TIME:  Fluoroscopy Time:  3 minutes 43 seconds Radiation Exposure Index (if provided by the fluoroscopic device): 66.7 mGy COMPARISON:  MRCP 12/04/2018 FINDINGS: A total of 4 intraoperative saved images are submitted for review. The images demonstrate a metal stent in the common hepatic and common bile ducts with a flexible endoscope in the descending duodenum. Subsequent images demonstrate wire cannulation of the stent and balloon dilation. IMPRESSION: ERCP with cannulation of the previously placed metal stent and balloon dilatation. These images were submitted for radiologic interpretation only. Please see the procedural report for the amount of contrast and the fluoroscopy time utilized. Electronically Signed   By: Jacqulynn Cadet M.D.   On: 12/06/2018 11:51    Ir Abdomen US Limited  Result Date: 11/29/2018 CLINICAL DATA:  Metastatic pancreas adenocarcinoma, abdominal distension assess for ascites EXAM: LIMITED ABDOMEN ULTRASOUND FOR ASCITES TECHNIQUE: Limited ultrasound survey for ascites was performed in all four abdominal quadrants. COMPARISON:  None. FINDINGS: Ultrasound survey of the abdominal 4 quadrants demonstrates trace amount of lower abdominopelvic ascites. Not enough to warrant therapeutic paracentesis. Procedure not performed. IMPRESSION: Trace amount of abdominopelvic ascites by ultrasound. Procedure not performed. Electronically Signed   By: Jerilynn Mages.  Shick M.D.   On: 11/29/2018 13:25   [place] Ascites (abdomen Limited)  Result Date: 12/25/2018 CLINICAL DATA:  63 year old male with abdominal distension, ascites EXAM: LIMITED ABDOMEN ULTRASOUND FOR ASCITES TECHNIQUE: Limited ultrasound survey for ascites was performed in all four abdominal quadrants. COMPARISON:  Prior CT scan of the abdomen and pelvis 12/24/2018 FINDINGS: Sonographic interrogation was performed of the 4 quadrants of the abdomen. There is trace perihepatic fluid in the right upper quadrant with multiple small loops of bowel. Overall, fluid volume is low. No safe window for paracentesis. IMPRESSION: Small volume ascites, insufficient for paracentesis. Electronically Signed   By: Jacqulynn Cadet M.D.   On: 12/25/2018 12:13    ASSESSMENT AND PLAN: 1. Pancreas head mass  CT abdomen/pelvis 08/03/2017-fullness in the head of the pancreas with stranding in the peripancreatic fat and pancreatic ductal dilatation.   MRI abdomen 08/04/2017-findings favored to  represent acute on chronic pancreatitis; equivocal soft tissue fullness within the head and uncinate process of the pancreas; indeterminate too small to characterize right hepatic lobe lesion; small volume abdominal ascites; splenomegaly.   CA-19-9 elevated at 977on 08/04/2017.  Status postupper EUS 08/10/2017-findings of an irregular  masslike region in the pancreatic head measuring approximately 2.7 cm. This directly abutted the main portal vein but no other major vascular structures. Biopsies obtained with preliminary cytology positive for malignancy, likely adenocarcinoma. The final report is pending. The common bile duct and main pancreatic duct were both dilated. ERCP was then proceeded with forstent placement. Multiple attempts were made tocannulate the bile duct without success. The procedure was aborted.  08/16/2017 ERCP with placement of a metal biliary stent in the common bile duct by Dr. Francella Solian at [facility].  Cycle 1 FOLFIRINOX 08/31/2017  Cycle 2 FOLFIRINOX 09/14/2017  Cycle 3 FOLFIRINOX 09/28/2017  Cycle 4 FOLFIRINOX 10/12/2017 (oxaliplatin dose reduced secondary to thrombocytopenia)  Cycle 5 FOLFIRINOX 10/26/2017  Cycle 6 FOLFIRINOX 11/09/2017  Restaging CTs at [facility] 11/29/2017-no definitive evidence of distant metastatic disease. 2 subcentimeter liver lesions described on prior MRI not visualized on CT. Ill-defined pancreatic head mass stable to decreased in size measuring 2.1 x 2 cm. Peripancreatic inflammatory stranding decreased from prior. No biliary ductal dilatation. Celiac axis less than 180 degrees abutment. Common hepatic artery with greater than 180 degrees encasement; superior mesenteric artery with short segment less than 180 degrees abutment; portal vein/superior mesenteric vein with greater than 180 degrees encasement of the extrahepatic portal vein with associated circumferential narrowing at the portomesenteric, overall substantially improved from prior examination. Now less than 180 degree abutment of the SMV. The portal vein and SMV remain patent. Splenic vein patent.  Cycle 1 gemcitabine/Abraxane 12/06/2017  Cycle 2 gemcitabine/Abraxane 12/20/2017  Cycle 3 gemcitabine/Abraxane 01/03/2018  Cycle 4 gemcitabine/Abraxane 01/17/2018  Cycle 5 gemcitabine 01/31/2018 (Abraxane held due to  neuropathy)  Cycle 6 gemcitabine 02/14/2018 (Abraxane held due to neuropathy)  Cycle 7 gemcitabine 02/28/2018 (Abraxane held due to neuropathy)  CT chest/abdomen/pelvis at [facility] 03/08/2018-stable appearance of previously identified infiltrating pancreatic head mass. No CT evidence of metastatic disease.  SBRT to the pancreas 04/05/2018-04/11/2018  05/09/2018 CA-19-9 2138   MRI abdomen 05/09/2018-similar 6 mm hypoenhancing focus posterior right lobe liver; more superior lesion near the dome of the liver not identified on the postcontrast imaging but there is a persistent focus of diffusion signal abnormality in this region; new 8 mm hypoenhancing focus located just superior to the gallbladder; additional tiny focus of diffusion abnormality located more superiorly without obvious associated abnormal enhancement. Ill-defined pancreatic head mass not felt to be significantly changed.  CT chest/abdomen/pelvis 05/10/2018-ill-defined hypoattenuating pancreatic head mass and ill-defined soft tissue abutting the celiac axis with mildly increased dilatation of the main pancreatic duct. New soft tissue nodule along the right upper lobe bronchus; hypoattenuating liver lesions concerning for metastasis.  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 The stent traverses the pancreatic mass and extends into the duodenum. Increase in size of 2 liver lesions. Pancreatic mass appears similar. Indistinct tissue planes around the duodenum along with wall thickening in the gallbladder and especially the gallbladder neck.  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 Diffuse hepatic metastasis. Previous index lesions unchanged in size. Single new metastasis identified within the medial segment left lobe of liver. 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 Abdominal ascites increased in the interval.  Cycle 7 FOLFIRINOX6/02/2019 2. Hypertension 3. Port-A-Cath placement 08/30/2017 4. History of elevated bilirubin-questionGilbert's syndrome 5. Mild leukopenia, thrombocytopenia-question secondary to portal hypertension/splenomegaly;09/14/2017 white count improved, platelet count  stable;progressive thrombocytopenia following gemcitabine/Abraxane 6. Kidney stones 7. Fever following cycles 2 and 3 gemcitabine/Abraxane,? Fever related to gemcitabine 8.Jaundice, elevated liver enzymes and bilirubin 06/22/2018; status post ERCP 06/25/2018 with debris removed from the existing stent and a new plastic stent placed. Recurrent jaundice 07/19/2018 status post ERCP 07/20/2018 with finding that the previously placed plastic stent had migrated distally. There was debris and sludge on the stent. The previously placed stent was removed. 5 to 8 mm stricture just proximal to the existing SEMS. 2 plastic stents were placed.ERCP 08/16/2018-2 plastic biliary stents were removed from within the previously placed partially covered SEMS; stricture confirmed just proximal to the previously placed SEMS, treated with placement of an 8 cm long 10 mm diameter uncovered SEMS in good position. 9.Hospitalized with bacteremia Streptococcus group C 11/04/2018 through 11/08/2018. IV ceftriaxone through 11/17/2018. TEE 11/08/2018 negative. 10.Elevated LFTs 12/03/2018-MRI abdomen 12/04/2018 with mild increase in size of mass involving head of the pancreas. Diffuse hepatic metastasis. Previous index lesions unchanged in size. Single new metastasis identified within the medial segment left lobe of liver. 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 Abdominal ascites increased in the interval. 12/06/2018 ERCP-previously placed uncovered 8 cm long metal biliary stent nearly filled with bio debris and flecks of tissue ingrowth. Stent cleared. 11. 12/24/18 -admission with sepsis syndrome, blood culture from right arm positive for Streptococcus Anginosis, blood culture from Port-A-Cath with a gram-negative rod  Mr. Schreur appears well.  The sepsis syndrome quickly resolved when he was placed on IV antibiotics.  The source for infection remains unclear.  He will have the Port-A-Cath  removed today.  I will discuss home antibiotic therapy with Dr. Reesa Chew.  He will need IV access to continue chemotherapy for treatment of pancreas cancer.  I think it is reasonable to place a PICC for home antibiotic therapy and chemotherapy as he has received 3 days of IV antibiotics.  Thrombocytopenia from sepsis and chemotherapy has improved.  He will return for follow-up at the cancer center as scheduled on 12/31/2018.   LOS: 3 days   Betsy Coder, DNP, AGPCNP-BC, AOCNP 12/27/18

## 2018-12-28 ENCOUNTER — Other Ambulatory Visit: Payer: Self-pay

## 2018-12-28 ENCOUNTER — Other Ambulatory Visit: Payer: Self-pay | Admitting: Nurse Practitioner

## 2018-12-28 ENCOUNTER — Inpatient Hospital Stay (HOSPITAL_COMMUNITY): Payer: BC Managed Care – PPO

## 2018-12-28 ENCOUNTER — Encounter (HOSPITAL_COMMUNITY): Payer: Self-pay

## 2018-12-28 ENCOUNTER — Telehealth: Payer: Self-pay | Admitting: *Deleted

## 2018-12-28 ENCOUNTER — Inpatient Hospital Stay (HOSPITAL_COMMUNITY)
Admission: EM | Admit: 2018-12-28 | Discharge: 2019-01-04 | DRG: 872 | Disposition: A | Discharge status: Home / Self Care | Payer: BC Managed Care – PPO | Attending: Internal Medicine | Admitting: Internal Medicine

## 2018-12-28 ENCOUNTER — Emergency Department (HOSPITAL_COMMUNITY): Payer: BC Managed Care – PPO

## 2018-12-28 DIAGNOSIS — N400 Enlarged prostate without lower urinary tract symptoms: Secondary | ICD-10-CM | POA: Diagnosis present

## 2018-12-28 DIAGNOSIS — K8689 Other specified diseases of pancreas: Secondary | ICD-10-CM | POA: Diagnosis not present

## 2018-12-28 DIAGNOSIS — Z20828 Contact with and (suspected) exposure to other viral communicable diseases: Secondary | ICD-10-CM | POA: Diagnosis present

## 2018-12-28 DIAGNOSIS — R188 Other ascites: Secondary | ICD-10-CM

## 2018-12-28 DIAGNOSIS — N179 Acute kidney failure, unspecified: Secondary | ICD-10-CM | POA: Diagnosis not present

## 2018-12-28 DIAGNOSIS — G629 Polyneuropathy, unspecified: Secondary | ICD-10-CM | POA: Diagnosis present

## 2018-12-28 DIAGNOSIS — C250 Malignant neoplasm of head of pancreas: Secondary | ICD-10-CM

## 2018-12-28 DIAGNOSIS — B955 Unspecified streptococcus as the cause of diseases classified elsewhere: Secondary | ICD-10-CM | POA: Diagnosis not present

## 2018-12-28 DIAGNOSIS — R109 Unspecified abdominal pain: Secondary | ICD-10-CM | POA: Diagnosis not present

## 2018-12-28 DIAGNOSIS — C787 Secondary malignant neoplasm of liver and intrahepatic bile duct: Secondary | ICD-10-CM | POA: Diagnosis not present

## 2018-12-28 DIAGNOSIS — N200 Calculus of kidney: Secondary | ICD-10-CM | POA: Diagnosis present

## 2018-12-28 DIAGNOSIS — Z95828 Presence of other vascular implants and grafts: Secondary | ICD-10-CM | POA: Diagnosis not present

## 2018-12-28 DIAGNOSIS — I10 Essential (primary) hypertension: Secondary | ICD-10-CM | POA: Diagnosis present

## 2018-12-28 DIAGNOSIS — R3911 Hesitancy of micturition: Secondary | ICD-10-CM | POA: Diagnosis not present

## 2018-12-28 DIAGNOSIS — E785 Hyperlipidemia, unspecified: Secondary | ICD-10-CM | POA: Diagnosis not present

## 2018-12-28 DIAGNOSIS — T451X5A Adverse effect of antineoplastic and immunosuppressive drugs, initial encounter: Secondary | ICD-10-CM | POA: Diagnosis present

## 2018-12-28 DIAGNOSIS — A409 Streptococcal sepsis, unspecified: Principal | ICD-10-CM | POA: Diagnosis present

## 2018-12-28 DIAGNOSIS — D509 Iron deficiency anemia, unspecified: Secondary | ICD-10-CM | POA: Diagnosis present

## 2018-12-28 DIAGNOSIS — R7881 Bacteremia: Secondary | ICD-10-CM | POA: Diagnosis present

## 2018-12-28 DIAGNOSIS — A401 Sepsis due to streptococcus, group B: Secondary | ICD-10-CM | POA: Diagnosis not present

## 2018-12-28 DIAGNOSIS — C259 Malignant neoplasm of pancreas, unspecified: Secondary | ICD-10-CM | POA: Diagnosis not present

## 2018-12-28 DIAGNOSIS — Z8349 Family history of other endocrine, nutritional and metabolic diseases: Secondary | ICD-10-CM

## 2018-12-28 DIAGNOSIS — E876 Hypokalemia: Secondary | ICD-10-CM | POA: Diagnosis present

## 2018-12-28 DIAGNOSIS — R509 Fever, unspecified: Secondary | ICD-10-CM | POA: Diagnosis present

## 2018-12-28 DIAGNOSIS — R6520 Severe sepsis without septic shock: Secondary | ICD-10-CM | POA: Diagnosis not present

## 2018-12-28 DIAGNOSIS — B377 Candidal sepsis: Secondary | ICD-10-CM | POA: Diagnosis not present

## 2018-12-28 DIAGNOSIS — R197 Diarrhea, unspecified: Secondary | ICD-10-CM | POA: Diagnosis not present

## 2018-12-28 DIAGNOSIS — Z85828 Personal history of other malignant neoplasm of skin: Secondary | ICD-10-CM | POA: Diagnosis not present

## 2018-12-28 DIAGNOSIS — A498 Other bacterial infections of unspecified site: Secondary | ICD-10-CM | POA: Diagnosis not present

## 2018-12-28 DIAGNOSIS — Z8249 Family history of ischemic heart disease and other diseases of the circulatory system: Secondary | ICD-10-CM

## 2018-12-28 DIAGNOSIS — Z8619 Personal history of other infectious and parasitic diseases: Secondary | ICD-10-CM | POA: Diagnosis not present

## 2018-12-28 DIAGNOSIS — K5900 Constipation, unspecified: Secondary | ICD-10-CM | POA: Diagnosis not present

## 2018-12-28 DIAGNOSIS — Z87442 Personal history of urinary calculi: Secondary | ICD-10-CM

## 2018-12-28 DIAGNOSIS — D638 Anemia in other chronic diseases classified elsewhere: Secondary | ICD-10-CM | POA: Diagnosis present

## 2018-12-28 DIAGNOSIS — H9190 Unspecified hearing loss, unspecified ear: Secondary | ICD-10-CM | POA: Diagnosis not present

## 2018-12-28 DIAGNOSIS — C254 Malignant neoplasm of endocrine pancreas: Secondary | ICD-10-CM | POA: Diagnosis not present

## 2018-12-28 DIAGNOSIS — Z8507 Personal history of malignant neoplasm of pancreas: Secondary | ICD-10-CM

## 2018-12-28 DIAGNOSIS — K769 Liver disease, unspecified: Secondary | ICD-10-CM | POA: Diagnosis not present

## 2018-12-28 DIAGNOSIS — D61818 Other pancytopenia: Secondary | ICD-10-CM | POA: Diagnosis present

## 2018-12-28 DIAGNOSIS — B966 Bacteroides fragilis [B. fragilis] as the cause of diseases classified elsewhere: Secondary | ICD-10-CM | POA: Diagnosis not present

## 2018-12-28 DIAGNOSIS — A408 Other streptococcal sepsis: Secondary | ICD-10-CM | POA: Diagnosis not present

## 2018-12-28 DIAGNOSIS — I361 Nonrheumatic tricuspid (valve) insufficiency: Secondary | ICD-10-CM | POA: Diagnosis not present

## 2018-12-28 DIAGNOSIS — Z79891 Long term (current) use of opiate analgesic: Secondary | ICD-10-CM

## 2018-12-28 DIAGNOSIS — Z452 Encounter for adjustment and management of vascular access device: Secondary | ICD-10-CM | POA: Diagnosis not present

## 2018-12-28 DIAGNOSIS — A419 Sepsis, unspecified organism: Secondary | ICD-10-CM | POA: Diagnosis not present

## 2018-12-28 DIAGNOSIS — Z79899 Other long term (current) drug therapy: Secondary | ICD-10-CM

## 2018-12-28 LAB — SARS CORONAVIRUS 2 BY RT PCR (HOSPITAL ORDER, PERFORMED IN ~~LOC~~ HOSPITAL LAB): SARS Coronavirus 2: NEGATIVE

## 2018-12-28 LAB — URINALYSIS, ROUTINE W REFLEX MICROSCOPIC
Bacteria, UA: NONE SEEN
Bilirubin Urine: NEGATIVE
Glucose, UA: 50 mg/dL — AB
Ketones, ur: NEGATIVE mg/dL
Leukocytes,Ua: NEGATIVE
Nitrite: NEGATIVE
Protein, ur: 30 mg/dL — AB
Specific Gravity, Urine: 1.016 (ref 1.005–1.030)
pH: 5 (ref 5.0–8.0)

## 2018-12-28 LAB — CBC WITH DIFFERENTIAL/PLATELET
Abs Immature Granulocytes: 0.03 10*3/uL (ref 0.00–0.07)
Basophils Absolute: 0 10*3/uL (ref 0.0–0.1)
Basophils Relative: 0 %
Eosinophils Absolute: 0 10*3/uL (ref 0.0–0.5)
Eosinophils Relative: 0 %
HCT: 27.7 % — ABNORMAL LOW (ref 39.0–52.0)
Hemoglobin: 9.3 g/dL — ABNORMAL LOW (ref 13.0–17.0)
Immature Granulocytes: 1 %
Lymphocytes Relative: 1 %
Lymphs Abs: 0.1 10*3/uL — ABNORMAL LOW (ref 0.7–4.0)
MCH: 28.5 pg (ref 26.0–34.0)
MCHC: 33.6 g/dL (ref 30.0–36.0)
MCV: 85 fL (ref 80.0–100.0)
Monocytes Absolute: 0.3 10*3/uL (ref 0.1–1.0)
Monocytes Relative: 6 %
Neutro Abs: 5.3 10*3/uL (ref 1.7–7.7)
Neutrophils Relative %: 92 %
Platelets: 97 10*3/uL — ABNORMAL LOW (ref 150–400)
RBC: 3.26 MIL/uL — ABNORMAL LOW (ref 4.22–5.81)
RDW: 14.6 % (ref 11.5–15.5)
WBC: 5.7 10*3/uL (ref 4.0–10.5)
nRBC: 0 % (ref 0.0–0.2)

## 2018-12-28 LAB — COMPREHENSIVE METABOLIC PANEL
ALT: 51 U/L — ABNORMAL HIGH (ref 0–44)
AST: 35 U/L (ref 15–41)
Albumin: 2.4 g/dL — ABNORMAL LOW (ref 3.5–5.0)
Alkaline Phosphatase: 525 U/L — ABNORMAL HIGH (ref 38–126)
Anion gap: 9 (ref 5–15)
BUN: 9 mg/dL (ref 8–23)
CO2: 24 mmol/L (ref 22–32)
Calcium: 7.8 mg/dL — ABNORMAL LOW (ref 8.9–10.3)
Chloride: 99 mmol/L (ref 98–111)
Creatinine, Ser: 0.88 mg/dL (ref 0.61–1.24)
GFR calc Af Amer: >60 mL/min (ref >60)
GFR calc non Af Amer: >60 mL/min (ref >60)
Glucose, Bld: 120 mg/dL — ABNORMAL HIGH (ref 70–99)
Potassium: 3.1 mmol/L — ABNORMAL LOW (ref 3.5–5.1)
Sodium: 132 mmol/L — ABNORMAL LOW (ref 135–145)
Total Bilirubin: 0.7 mg/dL (ref 0.3–1.2)
Total Protein: 5 g/dL — ABNORMAL LOW (ref 6.5–8.1)

## 2018-12-28 LAB — CULTURE, BLOOD (SINGLE): Special Requests: ADEQUATE

## 2018-12-28 LAB — LACTIC ACID, PLASMA: Lactic Acid, Venous: 1 mmol/L (ref 0.5–1.9)

## 2018-12-28 LAB — PROTIME-INR
INR: 1.2 (ref 0.8–1.2)
Prothrombin Time: 15.2 seconds (ref 11.4–15.2)

## 2018-12-28 MED ORDER — TAMSULOSIN HCL 0.4 MG PO CAPS
0.4000 mg | ORAL_CAPSULE | Freq: Every day | ORAL | Status: DC
  Administered 2018-12-29: 0.4 mg via ORAL
  Filled 2018-12-28: qty 1

## 2018-12-28 MED ORDER — METRONIDAZOLE IN NACL 5-0.79 MG/ML-% IV SOLN
500.0000 mg | Freq: Three times a day (TID) | INTRAVENOUS | Status: DC
  Administered 2018-12-29 – 2018-12-31 (×8): 500 mg via INTRAVENOUS
  Filled 2018-12-28 (×8): qty 100

## 2018-12-28 MED ORDER — POTASSIUM CHLORIDE CRYS ER 20 MEQ PO TBCR
20.0000 meq | EXTENDED_RELEASE_TABLET | Freq: Two times a day (BID) | ORAL | Status: DC
  Administered 2018-12-29 – 2019-01-04 (×12): 20 meq via ORAL
  Filled 2018-12-28 (×12): qty 1

## 2018-12-28 MED ORDER — ONDANSETRON HCL 4 MG PO TABS: 4.0000 mg | ORAL_TABLET | Freq: Four times a day (QID) | ORAL | Status: DC | PRN

## 2018-12-28 MED ORDER — ACETAMINOPHEN 325 MG PO TABS: 650.0000 mg | ORAL_TABLET | Freq: Four times a day (QID) | ORAL | Status: DC | PRN

## 2018-12-28 MED ORDER — VANCOMYCIN HCL IN DEXTROSE 1-5 GM/200ML-% IV SOLN: 1000.0000 mg | Freq: Once | INTRAVENOUS | Status: DC

## 2018-12-28 MED ORDER — OXYCODONE-ACETAMINOPHEN 5-325 MG PO TABS
1.0000 | ORAL_TABLET | ORAL | Status: DC | PRN
  Administered 2018-12-29 – 2019-01-02 (×4): 1 via ORAL
  Filled 2018-12-28 (×5): qty 1

## 2018-12-28 MED ORDER — OXYCODONE HCL 5 MG PO TABS
5.0000 mg | ORAL_TABLET | ORAL | Status: DC | PRN
  Administered 2018-12-30 – 2019-01-03 (×9): 5 mg via ORAL
  Filled 2018-12-28 (×5): qty 1

## 2018-12-28 MED ORDER — OXYCODONE-ACETAMINOPHEN 10-325 MG PO TABS: 1.0000 | ORAL_TABLET | ORAL | 0 refills | Status: DC | PRN

## 2018-12-28 MED ORDER — MAGNESIUM CITRATE PO SOLN: 1.0000 | Freq: Once | ORAL | Status: DC | PRN

## 2018-12-28 MED ORDER — ZOLPIDEM TARTRATE 5 MG PO TABS
5.0000 mg | ORAL_TABLET | Freq: Every evening | ORAL | Status: DC | PRN
  Administered 2018-12-29 – 2019-01-03 (×6): 5 mg via ORAL
  Filled 2018-12-28 (×7): qty 1

## 2018-12-28 MED ORDER — ACETAMINOPHEN 650 MG RE SUPP: 650.0000 mg | Freq: Four times a day (QID) | RECTAL | Status: DC | PRN

## 2018-12-28 MED ORDER — OXYCODONE HCL 5 MG PO TABS
5.0000 mg | ORAL_TABLET | ORAL | Status: DC | PRN
  Administered 2018-12-29: 5 mg via ORAL
  Filled 2018-12-28 (×5): qty 1

## 2018-12-28 MED ORDER — DOXAZOSIN MESYLATE 2 MG PO TABS
1.0000 mg | ORAL_TABLET | Freq: Every day | ORAL | Status: DC
  Administered 2018-12-29: 22:00:00 via ORAL
  Administered 2018-12-29 – 2019-01-03 (×6): 1 mg via ORAL
  Filled 2018-12-28 (×7): qty 1

## 2018-12-28 MED ORDER — AMLODIPINE BESYLATE 10 MG PO TABS
10.0000 mg | ORAL_TABLET | Freq: Every day | ORAL | Status: DC
  Administered 2018-12-29 – 2019-01-03 (×7): 10 mg via ORAL
  Filled 2018-12-28 (×7): qty 1

## 2018-12-28 MED ORDER — OXYCODONE-ACETAMINOPHEN 10-325 MG PO TABS: 1.0000 | ORAL_TABLET | ORAL | Status: DC | PRN

## 2018-12-28 MED ORDER — BISACODYL 5 MG PO TBEC: 5.0000 mg | DELAYED_RELEASE_TABLET | Freq: Every day | ORAL | Status: DC | PRN

## 2018-12-28 MED ORDER — POTASSIUM CHLORIDE CRYS ER 20 MEQ PO TBCR
40.0000 meq | EXTENDED_RELEASE_TABLET | Freq: Once | ORAL | Status: AC
  Administered 2018-12-29: 40 meq via ORAL
  Filled 2018-12-28: qty 2

## 2018-12-28 MED ORDER — SODIUM CHLORIDE 0.9% FLUSH: 10.0000 mL | INTRAVENOUS | Status: DC | PRN

## 2018-12-28 MED ORDER — VANCOMYCIN HCL 10 G IV SOLR
1500.0000 mg | Freq: Two times a day (BID) | INTRAVENOUS | Status: DC
  Administered 2018-12-29 – 2018-12-30 (×3): 1500 mg via INTRAVENOUS
  Filled 2018-12-28 (×4): qty 1500

## 2018-12-28 MED ORDER — SENNOSIDES-DOCUSATE SODIUM 8.6-50 MG PO TABS: 1.0000 | ORAL_TABLET | Freq: Every evening | ORAL | Status: DC | PRN

## 2018-12-28 MED ORDER — PIPERACILLIN-TAZOBACTAM 3.375 G IVPB 30 MIN
3.3750 g | Freq: Once | INTRAVENOUS | Status: AC
  Administered 2018-12-28: 3.375 g via INTRAVENOUS
  Filled 2018-12-28: qty 50

## 2018-12-28 MED ORDER — SODIUM CHLORIDE 0.9 % IV SOLN
2.0000 g | Freq: Three times a day (TID) | INTRAVENOUS | Status: DC
  Administered 2018-12-29 – 2018-12-31 (×8): 2 g via INTRAVENOUS
  Filled 2018-12-28 (×9): qty 2

## 2018-12-28 MED ORDER — IRBESARTAN 300 MG PO TABS
300.0000 mg | ORAL_TABLET | Freq: Every day | ORAL | Status: DC
  Administered 2018-12-29 – 2019-01-04 (×7): 300 mg via ORAL
  Filled 2018-12-28 (×7): qty 1

## 2018-12-28 MED ORDER — VANCOMYCIN HCL IN DEXTROSE 1-5 GM/200ML-% IV SOLN
1000.0000 mg | Freq: Once | INTRAVENOUS | Status: AC
  Administered 2018-12-28: 1000 mg via INTRAVENOUS
  Filled 2018-12-28: qty 200

## 2018-12-28 MED ORDER — ALBUTEROL SULFATE (2.5 MG/3ML) 0.083% IN NEBU: 2.5000 mg | INHALATION_SOLUTION | Freq: Four times a day (QID) | RESPIRATORY_TRACT | Status: DC | PRN

## 2018-12-28 MED ORDER — IPRATROPIUM BROMIDE 0.02 % IN SOLN: 0.5000 mg | Freq: Four times a day (QID) | RESPIRATORY_TRACT | Status: DC | PRN

## 2018-12-28 MED ORDER — CALAMINE EX LOTN
TOPICAL_LOTION | Freq: Two times a day (BID) | CUTANEOUS | Status: DC
  Administered 2018-12-29 – 2019-01-04 (×4): via TOPICAL
  Filled 2018-12-28 (×2): qty 118

## 2018-12-28 MED ORDER — SODIUM CHLORIDE 0.9 % IV BOLUS
1000.0000 mL | Freq: Once | INTRAVENOUS | Status: AC
  Administered 2018-12-28: 1000 mL via INTRAVENOUS

## 2018-12-28 MED ORDER — PROCHLORPERAZINE MALEATE 10 MG PO TABS: 10.0000 mg | ORAL_TABLET | Freq: Four times a day (QID) | ORAL | Status: DC | PRN

## 2018-12-28 MED ORDER — SODIUM CHLORIDE 0.9 % IV SOLN: 2.0000 g | Freq: Once | INTRAVENOUS | Status: DC

## 2018-12-28 MED ORDER — ONDANSETRON HCL 4 MG/2ML IJ SOLN: 4.0000 mg | Freq: Four times a day (QID) | INTRAMUSCULAR | Status: DC | PRN

## 2018-12-28 MED ORDER — ENOXAPARIN SODIUM 40 MG/0.4ML ~~LOC~~ SOLN
40.0000 mg | Freq: Every day | SUBCUTANEOUS | Status: DC
  Administered 2018-12-29 – 2019-01-04 (×7): 40 mg via SUBCUTANEOUS
  Filled 2018-12-28 (×7): qty 0.4

## 2018-12-28 MED ORDER — SODIUM CHLORIDE 0.9 % IV SOLN
INTRAVENOUS | Status: DC
  Administered 2018-12-29 – 2019-01-02 (×6): via INTRAVENOUS

## 2018-12-28 MED ORDER — GABAPENTIN 100 MG PO CAPS
200.0000 mg | ORAL_CAPSULE | Freq: Two times a day (BID) | ORAL | Status: DC
  Administered 2018-12-29 – 2019-01-04 (×14): 200 mg via ORAL
  Filled 2018-12-28 (×14): qty 2

## 2018-12-28 MED ORDER — SODIUM CHLORIDE 0.9% FLUSH
3.0000 mL | Freq: Once | INTRAVENOUS | Status: AC
  Administered 2018-12-28: 3 mL via INTRAVENOUS

## 2018-12-28 MED ORDER — POTASSIUM CHLORIDE CRYS ER 20 MEQ PO TBCR: 20.0000 meq | EXTENDED_RELEASE_TABLET | Freq: Two times a day (BID) | ORAL | 1 refills | Status: DC

## 2018-12-28 NOTE — Progress Notes (Signed)
Pharmacy Antibiotic Note  Cory Kelly is a 63 y.o. male admitted on 12/28/2018 with sepsis.  Pharmacy has been consulted for vanc/cefepime dosing.  Plan:  Vanc 1g x 1 given at [place] in ED, start vanc 1500mg  IV q12 thereafter - goal AUC 400-550  Cefepime 2g IV q8  Height: 6\' 5"  (195.6 cm) Weight: 195 lb 5.2 oz (88.6 kg) IBW/kg (Calculated) : 89.1  Temp (24hrs), Avg:103.3 F (39.6 C), Min:103.3 F (39.6 C), Max:103.3 F (39.6 C)  Recent Labs  Lab 12/24/18 1123 12/24/18 1205 12/24/18 1328 12/25/18 0725 12/26/18 0555 12/27/18 0652 12/28/18 1708 12/28/18 1750  WBC 9.3  --   --  2.3* 2.6* 2.2*  --  5.7  CREATININE  --  0.94  --  0.71 0.72 0.68  --  0.88  LATICACIDVEN  --   --  1.7  --   --   --  1.0  --     Estimated Creatinine Clearance: 110.5 mL/min (by C-G formula based on SCr of 0.88 mg/dL).    No Known Allergies   Thank you for allowing pharmacy to be a part of this patient's care.  [name] 12/28/2018 8:06 PM

## 2018-12-28 NOTE — Progress Notes (Signed)
Called the ED to get report on this admission. The nurse is unavailable at this time. I will continue to monitor.

## 2018-12-28 NOTE — Telephone Encounter (Signed)
Cory Kelly called to say they are on their way home from [place] started having shaking chills and confusion. Had a similar episode last night. Received 1st dose of Rocephin today at 1030 via PICC.   Dr Benay Spice wants them to come to [facility] ED.  Wife verbalized understanding.

## 2018-12-28 NOTE — H&P (Signed)
History and Physical   [facility] [name] Admission History and Physical [employer], D.O.    Patient Name: Cory Kelly MR#: [id number] Date of Birth: 04-22-1956 Date of Admission: 12/28/2018  Referring MD/NP/PA: Dr. Tamera Punt Primary Care Physician: Mancel Bale, PA-C  Chief Complaint:  Chief Complaint  Patient presents with  . Fever  . Abnormal Lab  . Cancer patient    HPI: Cory Kelly is a 63 y.o. male with a known history of metastatic pancreatic cancer with bile duct stent presents to the emergency department for evaluation of fever, confusion.  Patient was admitted to the hospital last week from 6/22-24, for sepsis 2/2 strep.  Newer cultures grew gram negative rods. Port was removed.  He was sent home with a PICC and Rocephin.  Returns today with fever.     Patient denies fevers/chills, weakness, dizziness, chest pain, shortness of breath, N/V/C/D, abdominal pain, dysuria/frequency, changes in mental status.    Otherwise there has been no change in status. Patient has been taking medication as prescribed and there has been no recent change in medication or diet.  No recent antibiotics.  There has been no recent illness, hospitalizations, travel or sick contacts.    EMS/ED Course: Patient received Zosyn/Vanco. Medical admission has been requested for further management of sepsis, ? Bile duct stent.  Review of Systems:  CONSTITUTIONAL: Positive fever/chills, fatigue, weakness, weight gain/loss, headache. EYES: No blurry or double vision. ENT: No tinnitus, postnasal drip, redness or soreness of the oropharynx. RESPIRATORY: No cough, dyspnea, wheeze.  No hemoptysis.  CARDIOVASCULAR: No chest pain, palpitations, syncope, orthopnea. No lower extremity edema.  GASTROINTESTINAL: No nausea, vomiting, abdominal pain, diarrhea, constipation.  No hematemesis, melena or hematochezia. GENITOURINARY: No dysuria, frequency, hematuria. ENDOCRINE: No  polyuria or nocturia. No heat or cold intolerance. HEMATOLOGY: No anemia, bruising, bleeding. INTEGUMENTARY: No rashes, ulcers, lesions. MUSCULOSKELETAL: No arthritis, gout, dyspnea. NEUROLOGIC: POsitive AMS. No numbness, tingling, ataxia, seizure-type activity, weakness. PSYCHIATRIC: No anxiety, depression, insomnia.   Past Medical History:  Diagnosis Date  . Abdominal pain    due to bloating  . Bloating   . Cancer ([place])    skin cancer  . Essential hypertension   . Fatigue   . History of kidney stones    passed  . History of weight loss   . HOH (hard of hearing)    no hearing aids  . Neuralgia   . Pancreatic cancer ([place])   . Stress    loss of father in Dec 2018.    Past Surgical History:  Procedure Laterality Date  . APPENDECTOMY    . BILIARY STENT PLACEMENT N/A 06/25/2018   Procedure: BILIARY STENT PLACEMENT;  Surgeon: Rush Landmark Telford Nab., MD;  Location: [facility];  Service: Gastroenterology;  Laterality: N/A;  . BILIARY STENT PLACEMENT  07/20/2018   Procedure: BILIARY STENT PLACEMENT;  Surgeon: Milus Banister, MD;  Location: [facility] ENDOSCOPY;  Service: Gastroenterology;;  . BILIARY STENT PLACEMENT N/A 08/16/2018   Procedure: BILIARY STENT PLACEMENT;  Surgeon: Milus Banister, MD;  Location: [facility] ENDOSCOPY;  Service: Endoscopy;  Laterality: N/A;  . ENDOSCOPIC RETROGRADE CHOLANGIOPANCREATOGRAPHY (ERCP) WITH PROPOFOL N/A 08/10/2017   Procedure: ENDOSCOPIC RETROGRADE CHOLANGIOPANCREATOGRAPHY (ERCP) WITH PROPOFOL;  Surgeon: Milus Banister, MD;  Location: [facility] ENDOSCOPY;  Service: Endoscopy;  Laterality: N/A;  . ENDOSCOPIC RETROGRADE CHOLANGIOPANCREATOGRAPHY (ERCP) WITH PROPOFOL N/A 07/20/2018   Procedure: ENDOSCOPIC RETROGRADE CHOLANGIOPANCREATOGRAPHY (ERCP) WITH PROPOFOL;  Surgeon: Milus Banister, MD;  Location: [facility] ENDOSCOPY;  Service: Gastroenterology;  Laterality: N/A;  Balloon Sweeps  . ENDOSCOPIC RETROGRADE CHOLANGIOPANCREATOGRAPHY (ERCP) WITH PROPOFOL N/A 08/16/2018    Procedure: ENDOSCOPIC RETROGRADE CHOLANGIOPANCREATOGRAPHY (ERCP) WITH PROPOFOL;  Surgeon: Milus Banister, MD;  [facility];  Service: Endoscopy;  Laterality: N/A;  . ENDOSCOPIC RETROGRADE CHOLANGIOPANCREATOGRAPHY (ERCP) WITH PROPOFOL N/A 12/06/2018   Procedure: ENDOSCOPIC RETROGRADE CHOLANGIOPANCREATOGRAPHY (ERCP) WITH PROPOFOL;  Surgeon: Milus Banister, MD;  Location[facility] ENDOS[facility];  Service: Endoscopy;  Laterality: N/A;  cbd stent sweep  . ERCP N/A 06/25/2018   Procedure: ENDOSCOPIC RETROGRADE CHOLANGIOPANCREATOGRAPHY (ERCP);  Surgeon: Irving Copas., MD;  Location: [facility];  Service: Gastroenterology;  Laterality: N/A;  . EUS N/A 08/10/2017   Procedure: UPPER ENDOSCOPIC ULTRASOUND (EUS) RADIAL;  Surgeon: Milus Banister, MD;  Location: [facility] ENDOSCOPY;  Service: Endoscopy;  Laterality: N/A;  . EYE SURGERY     lasik/left eye  . IR FLUORO GUIDE PORT INSERTION RIGHT  08/30/2017  . IR REMOVAL TUN ACCESS W/ PORT W/O FL MOD SED  12/27/2018  . IR US GUIDE VASC ACCESS RIGHT  08/30/2017  . REMOVAL OF STONES  06/25/2018   Procedure: REMOVAL OF STONES;  Surgeon: Rush Landmark Telford Nab., MD;  Location: [facility] ENDOSCOPY;  Service: Gastroenterology;;  . SKIN CANCER EXCISION    . STENT REMOVAL  07/20/2018   Procedure: STENT REMOVAL;  Surgeon: Milus Banister, MD;  Location: [facility] ENDOSCOPY;  Service: Gastroenterology;;  . Lavell Islam REMOVAL  08/16/2018   Procedure: STENT REMOVAL;  Surgeon: Milus Banister, MD;  Location[facility] ENDOSCOPY;  Service: Endoscopy;;  . TEE WITHOUT CARDIOVERSION N/A 11/08/2018   Procedure: TRANSESOPHAGEAL ECHOCARDIOGRAM (TEE);  Surgeon: Acie Fredrickson Wonda Cheng, MD;  Location: [facility] ENDOSCOPY;  Service: Cardiovascular;  Laterality: N/A;     reports that he has never smoked. He has never used smokeless tobacco. He reports that he does not drink alcohol or use drugs.  No Known Allergies  Family History  Problem Relation Age of Onset  . Hyperlipidemia Father   . Heart disease Father    . Dementia Father   . Memory loss Mother   . Colon cancer Neg Hx   . Esophageal cancer Neg Hx   . Inflammatory bowel disease Neg Hx   . Liver disease Neg Hx   . Rectal cancer Neg Hx   . Stomach cancer Neg Hx   . Pancreatic cancer Neg Hx     Prior to Admission medications   Medication Sig Start Date End Date Taking? Authorizing Provider  amLODipine (NORVASC) 10 MG tablet Take 10 mg by mouth at bedtime.  05/21/18   [provider]  cefTRIAXone (ROCEPHIN) IVPB Inject 2 g into the vein daily for 10 days. Indication:  Strep bacteremia Last Day of Therapy:  01/06/2019 Labs - Once weekly:  CBC/D and BMP, Labs - Every other week:  ESR and CRP 12/27/18 01/06/19  Amin, Jeanella Flattery, MD  ciprofloxacin (CIPRO) 500 MG tablet Take 1 tablet (500 mg total) by mouth 2 (two) times daily. 12/17/18   Milton Ferguson, MD  doxazosin (CARDURA) 1 MG tablet Take 1 mg by mouth at bedtime.    [provider]  gabapentin (NEURONTIN) 100 MG capsule Take 2 capsules (200 mg total) by mouth 2 (two) times daily. 12/03/18   Owens Shark, NP  lidocaine-prilocaine (EMLA) cream Apply 1 application topically as needed. Apply to portacath site 1-2 hours prior to use Patient taking differently: Apply 1 application topically daily as needed (1-2 hours prior to portacath access).  08/20/18   Owens Shark,  NP  loperamide (IMODIUM A-D) 2 MG tablet Take 1 tablet (2 mg total) by mouth as directed. Patient taking differently: Take 2-4 mg by mouth 3 (three) times daily as needed for diarrhea or loose stools.  08/22/18   Ladell Pier, MD  olmesartan (BENICAR) 40 MG tablet Take 1 tablet (40 mg total) by mouth daily. 04/04/18   Weber, Damaris Hippo, PA-C  ondansetron (ZOFRAN ODT) 4 MG disintegrating tablet '4mg'$  ODT q4 hours prn nausea/vomit Patient not taking: Reported on 12/24/2018 12/17/18   Milton Ferguson, MD  ondansetron (ZOFRAN) 4 MG tablet Take 1 tablet (4 mg total) by mouth every 6 (six) hours as needed for nausea. 06/25/18    Roxan Hockey, MD  oxyCODONE-acetaminophen (PERCOCET) 10-325 MG tablet Take 1 tablet by mouth every 4 (four) hours as needed for pain. 12/28/18   Owens Shark, NP  potassium chloride SA (K-DUR) 20 MEQ tablet Take 1 tablet (20 mEq total) by mouth 2 (two) times daily. 12/28/18   Ladell Pier, MD  [facility] ACETATE EX Apply 1 application topically daily as needed (itching).    [provider]  prochlorperazine (COMPAZINE) 10 MG tablet Take 10 mg by mouth every 6 (six) hours as needed for nausea or vomiting.    [provider]  tamsulosin (FLOMAX) 0.4 MG CAPS capsule Take 1 capsule (0.4 mg total) by mouth daily after supper. 12/27/18   Damita Lack, MD    Physical Exam: Vitals:   12/28/18 1646 12/28/18 1703 12/28/18 1830 12/28/18 1900  BP: (!) 98/54  101/63 112/62  Pulse: 95  73 71  Resp: '16  15 16  '$ Temp: (!) 103.3 F (39.6 C)     TempSrc: Oral     SpO2: 95%  98% 97%  Weight:  88.6 kg    Height:  '6\' 5"'$  (1.956 m)      GENERAL: 63 y.o.-year-old male patient, well-developed, well-nourished lying in the bed in no acute distress.  Pleasant and cooperative.   HEENT: Head atraumatic, normocephalic. Pupils equal. Mucus membranes moist. NECK: Supple. No JVD. CHEST: Normal breath sounds bilaterally. No wheezing, rales, rhonchi or crackles. No use of accessory muscles of respiration.  No reproducible chest wall tenderness.  CARDIOVASCULAR: S1, S2 normal. No murmurs, rubs, or gallops. Cap refill <2 seconds. Pulses intact distally.  ABDOMEN: Soft, mildly distended, nontender. No rebound, guarding, rigidity. Normoactive bowel sounds present in all four quadrants.  EXTREMITIES: No pedal edema, cyanosis, or clubbing. No calf tenderness or Homan's sign.  NEUROLOGIC: The patient is alert and oriented x 3. Cranial nerves II through XII are grossly intact with no focal sensorimotor deficit. PSYCHIATRIC:  Normal affect, mood, thought content. SKIN: Warm, dry, and intact  without obvious rash, lesion, or ulcer.    Labs on Admission:  CBC: Recent Labs  Lab 12/24/18 1123 12/25/18 0725 12/26/18 0555 12/27/18 0652 12/28/18 1750  WBC 9.3 2.3* 2.6* 2.2* 5.7  NEUTROABS 8.2* 1.6*  --  1.7 5.3  HGB 12.0* 9.2* 9.3* 9.2* 9.3*  HCT 37.0* 28.4* 28.9* 27.8* 27.7*  MCV 87.5 87.9 87.6 86.1 85.0  PLT 169 60* 78* 90* 97*   Basic Metabolic Panel: Recent Labs  Lab 12/24/18 1205 12/25/18 0725 12/26/18 0555 12/27/18 0652 12/28/18 1750  NA 135 135 133* 135 132*  K 4.8 3.6 3.2* 3.4* 3.1*  CL 100 103 102 105 99  CO2 20* '23 26 23 24  '$ GLUCOSE 132* 104* 112* 114* 120*  BUN 6* 7* 6* 5* 9  CREATININE 0.94  0.71 0.72 0.68 0.88  CALCIUM 8.9 7.9* 7.8* 7.7* 7.8*  MG  --   --  1.6* 2.0  --    GFR: Estimated Creatinine Clearance: 110.5 mL/min (by C-G formula based on SCr of 0.88 mg/dL). Liver Function Tests: Recent Labs  Lab 12/24/18 1205 12/27/18 0652 12/28/18 1750  AST 40 28 35  ALT 73* 56* 51*  ALKPHOS 754* 531* 525*  BILITOT 0.8 0.4 0.7  PROT 6.3* 4.9* 5.0*  ALBUMIN 3.0* 2.5* 2.4*   Recent Labs  Lab 12/24/18 1328  LIPASE 24   No results for input(s): AMMONIA in the last 168 hours. Coagulation Profile: Recent Labs  Lab 12/28/18 1750  INR 1.2   Cardiac Enzymes: No results for input(s): CKTOTAL, CKMB, CKMBINDEX, TROPONINI in the last 168 hours. BNP (last 3 results) No results for input(s): PROBNP in the last 8760 hours. HbA1C: No results for input(s): HGBA1C in the last 72 hours. CBG: No results for input(s): GLUCAP in the last 168 hours. Lipid Profile: No results for input(s): CHOL, HDL, LDLCALC, TRIG, CHOLHDL, LDLDIRECT in the last 72 hours. Thyroid Function Tests: No results for input(s): TSH, T4TOTAL, FREET4, T3FREE, THYROIDAB in the last 72 hours. Anemia Panel: No results for input(s): VITAMINB12, FOLATE, FERRITIN, TIBC, IRON, RETICCTPCT in the last 72 hours. Urine analysis:    Component Value Date/Time   COLORURINE YELLOW 12/24/2018  1328   APPEARANCEUR CLEAR 12/24/2018 1328   LABSPEC 1.016 12/24/2018 1328   PHURINE 5.0 12/24/2018 1328   GLUCOSEU 50 (A) 12/24/2018 1328   HGBUR MODERATE (A) 12/24/2018 1328   BILIRUBINUR NEGATIVE 12/24/2018 1328   KETONESUR NEGATIVE 12/24/2018 1328   PROTEINUR 30 (A) 12/24/2018 1328   UROBILINOGEN 0.2 02/10/2007 1109   NITRITE NEGATIVE 12/24/2018 1328   LEUKOCYTESUR NEGATIVE 12/24/2018 1328   Sepsis Labs: '@LABRCNTIP'$ (procalcitonin:4,lacticidven:4) ) Recent Results (from the past 240 hour(s))  Culture, Blood     Status: Abnormal   Collection Time: 12/24/18 11:25 AM   Specimen: BLOOD RIGHT ARM  Result Value Ref Range Status   Specimen Description   Final    BLOOD RIGHT ARM Performed at [facility], [place] [address], [place], [place] [address]    Special Requests   Final    BOTTLES DRAWN AEROBIC AND ANAEROBIC Blood Culture adequate volume   Culture  Setup Time   Final    AEROBIC BOTTLE ONLY GRAM POSITIVE COCCI CRITICAL RESULT CALLED TO, READ BACK BY AND VERIFIED WITH: J LEGGE PHARMD 12/25/18 2030 JDW GRAM VARIABLE ROD ANAEROBIC BOTTLE ONLY CRITICAL RESULT CALLED TO, READ BACK BY AND VERIFIED WITH: Damian Leavell [id number] Performed at [facility], [place] [address] [place], [place] [address]    Culture STREPTOCOCCUS ANGINOSIS (A)  Final   Report Status 12/28/2018 FINAL  Final   Organism ID, Bacteria STREPTOCOCCUS ANGINOSIS  Final      Susceptibility   Streptococcus anginosis - MIC*    PENICILLIN 0.12 SENSITIVE Sensitive     CEFTRIAXONE 0.5 SENSITIVE Sensitive     ERYTHROMYCIN <=0.12 SENSITIVE Sensitive     LEVOFLOXACIN <=0.25 SENSITIVE Sensitive     VANCOMYCIN 0.5 SENSITIVE Sensitive     * STREPTOCOCCUS ANGINOSIS  Blood Culture ID Panel (Reflexed)     Status: Abnormal   Collection Time: 12/24/18 11:25 AM  Result Value Ref Range Status   Enterococcus species NOT DETECTED NOT DETECTED Final   Listeria monocytogenes NOT DETECTED NOT  DETECTED Final   Staphylococcus species NOT DETECTED NOT DETECTED Final  Staphylococcus aureus (BCID) NOT DETECTED NOT DETECTED Final   Streptococcus species DETECTED (A) NOT DETECTED Final    Comment: Not Enterococcus species, Streptococcus agalactiae, Streptococcus pyogenes, or Streptococcus pneumoniae. CRITICAL RESULT CALLED TO, READ BACK BY AND VERIFIED WITH: J LEGGE PHARMD 12/25/18 2030 JDW    Streptococcus agalactiae NOT DETECTED NOT DETECTED Final   Streptococcus pneumoniae NOT DETECTED NOT DETECTED Final   Streptococcus pyogenes NOT DETECTED NOT DETECTED Final   Acinetobacter baumannii NOT DETECTED NOT DETECTED Final   Enterobacteriaceae species NOT DETECTED NOT DETECTED Final   Enterobacter cloacae complex NOT DETECTED NOT DETECTED Final   Escherichia coli NOT DETECTED NOT DETECTED Final   Klebsiella oxytoca NOT DETECTED NOT DETECTED Final   Klebsiella pneumoniae NOT DETECTED NOT DETECTED Final   Proteus species NOT DETECTED NOT DETECTED Final   Serratia marcescens NOT DETECTED NOT DETECTED Final   Haemophilus influenzae NOT DETECTED NOT DETECTED Final   Neisseria meningitidis NOT DETECTED NOT DETECTED Final   Pseudomonas aeruginosa NOT DETECTED NOT DETECTED Final   Candida albicans NOT DETECTED NOT DETECTED Final   Candida glabrata NOT DETECTED NOT DETECTED Final   Candida krusei NOT DETECTED NOT DETECTED Final   Candida parapsilosis NOT DETECTED NOT DETECTED Final   Candida tropicalis NOT DETECTED NOT DETECTED Final    Comment: Performed at [facility], [place] [address] [place], [place] [address]  Culture, Blood     Status: None (Preliminary result)   Collection Time: 12/24/18 12:09 PM   Specimen: Porta Cath; Blood  Result Value Ref Range Status   Specimen Description   Final    PORTA CATH Performed at [facility], [place] [address], [place], [place] [address]    Special Requests   Final    NONE Performed at [facility], [place] [address] [place], [place] [address]    Culture  Setup Time   Final    GRAM NEGATIVE RODS ANAEROBIC BOTTLE ONLY CRITICAL RESULT CALLED TO, READ BACK BY AND VERIFIED WITH: [place] Slider PharmD 12:00 12/27/18 ([email or web address]) Performed at [facility], [place] [address], [place], [place] [address]    Culture GRAM NEGATIVE RODS  Final   Report Status PENDING  Incomplete  Blood Culture ID Panel (Reflexed)     Status: None   Collection Time: 12/24/18 12:09 PM  Result Value Ref Range Status   Enterococcus species NOT DETECTED NOT DETECTED Final   Listeria monocytogenes NOT DETECTED NOT DETECTED Final   Staphylococcus species NOT DETECTED NOT DETECTED Final   Staphylococcus aureus (BCID) NOT DETECTED NOT DETECTED Final   Streptococcus species NOT DETECTED NOT DETECTED Final   Streptococcus agalactiae NOT DETECTED NOT DETECTED Final   Streptococcus pneumoniae NOT DETECTED NOT DETECTED Final   Streptococcus pyogenes NOT DETECTED NOT DETECTED Final   Acinetobacter baumannii NOT DETECTED NOT DETECTED Final   Enterobacteriaceae species NOT DETECTED NOT DETECTED Final   Enterobacter cloacae complex NOT DETECTED NOT DETECTED Final   Escherichia coli NOT DETECTED NOT DETECTED Final   Klebsiella oxytoca NOT DETECTED NOT DETECTED Final   Klebsiella pneumoniae NOT DETECTED NOT DETECTED Final   Proteus species NOT DETECTED NOT DETECTED Final   Serratia marcescens NOT DETECTED NOT DETECTED Final   Haemophilus influenzae NOT DETECTED NOT DETECTED Final   Neisseria meningitidis NOT DETECTED NOT DETECTED Final   Pseudomonas aeruginosa NOT DETECTED NOT DETECTED Final   Candida albicans NOT DETECTED NOT DETECTED Final   Candida glabrata NOT DETECTED NOT DETECTED Final  Candida krusei NOT DETECTED NOT DETECTED Final   Candida parapsilosis NOT DETECTED NOT DETECTED Final   Candida tropicalis NOT DETECTED NOT DETECTED Final    Comment: Performed at [facility], [place] [address]  [place], [place] [address]  Blood Culture ID Panel (Reflexed)     Status: None   Collection Time: 12/24/18 12:27 PM  Result Value Ref Range Status   Enterococcus species NOT DETECTED NOT DETECTED Final   Listeria monocytogenes NOT DETECTED NOT DETECTED Final   Staphylococcus species NOT DETECTED NOT DETECTED Final   Staphylococcus aureus (BCID) NOT DETECTED NOT DETECTED Final   Streptococcus species NOT DETECTED NOT DETECTED Final   Streptococcus agalactiae NOT DETECTED NOT DETECTED Final   Streptococcus pneumoniae NOT DETECTED NOT DETECTED Final   Streptococcus pyogenes NOT DETECTED NOT DETECTED Final   Acinetobacter baumannii NOT DETECTED NOT DETECTED Final   Enterobacteriaceae species NOT DETECTED NOT DETECTED Final   Enterobacter cloacae complex NOT DETECTED NOT DETECTED Final   Escherichia coli NOT DETECTED NOT DETECTED Final   Klebsiella oxytoca NOT DETECTED NOT DETECTED Final   Klebsiella pneumoniae NOT DETECTED NOT DETECTED Final   Proteus species NOT DETECTED NOT DETECTED Final   Serratia marcescens NOT DETECTED NOT DETECTED Final   Haemophilus influenzae NOT DETECTED NOT DETECTED Final   Neisseria meningitidis NOT DETECTED NOT DETECTED Final   Pseudomonas aeruginosa NOT DETECTED NOT DETECTED Final   Candida albicans NOT DETECTED NOT DETECTED Final   Candida glabrata NOT DETECTED NOT DETECTED Final   Candida krusei NOT DETECTED NOT DETECTED Final   Candida parapsilosis NOT DETECTED NOT DETECTED Final   Candida tropicalis NOT DETECTED NOT DETECTED Final    Comment: Performed at [facility], [place] [address] [place], [place] [address]  Urine culture     Status: None   Collection Time: 12/24/18  1:28 PM   Specimen: Urine, Clean Catch  Result Value Ref Range Status   Specimen Description   Final    URINE, CLEAN CATCH Performed at [facility], [place] [address], [place], [place] [address]    Special Requests   Final    NONE Performed at [facility], [place] [address] [place], [place] [address]    Culture   Final    NO GROWTH Performed at [facility], [place] [address] [place], [place] [address]    Report Status 12/25/2018 FINAL  Final  SARS Coronavirus 2 (CEPHEID - Performed in [facility]), Hosp Order     Status: None   Collection Time: 12/24/18  1:28 PM   Specimen: Nasopharyngeal Swab  Result Value Ref Range Status   SARS Coronavirus 2 NEGATIVE NEGATIVE Final    Comment: (NOTE) If result is NEGATIVE SARS-CoV-2 target nucleic acids are NOT DETECTED. The SARS-CoV-2 RNA is generally detectable in upper and lower  respiratory specimens during the acute phase of infection. The lowest  concentration of SARS-CoV-2 viral copies this assay can detect is 250  copies / mL. A negative result does not preclude SARS-CoV-2 infection  and should not be used as the sole basis for treatment or other  patient management decisions.  A negative result may occur with  improper specimen collection / handling, submission of specimen other  than nasopharyngeal swab, presence of viral mutation(s) within the  areas targeted by this assay, and inadequate number of viral copies  (<250 copies / mL). A negative result must be combined with clinical  observations, patient history,  and epidemiological information. If result is POSITIVE SARS-CoV-2 target nucleic acids are DETECTED. The SARS-CoV-2 RNA is generally detectable in upper and lower  respiratory specimens dur ing the acute phase of infection.  Positive  results are indicative of active infection with SARS-CoV-2.  Clinical  correlation with patient history and other diagnostic information is  necessary to determine patient infection status.  Positive results do  not rule out bacterial infection or co-infection with other viruses. If result is PRESUMPTIVE POSTIVE SARS-CoV-2 nucleic acids MAY BE PRESENT.   A presumptive positive result was obtained on the  submitted specimen  and confirmed on repeat testing.  While 2019 novel coronavirus  (SARS-CoV-2) nucleic acids may be present in the submitted sample  additional confirmatory testing may be necessary for epidemiological  and / or clinical management purposes  to differentiate between  SARS-CoV-2 and other Sarbecovirus currently known to infect humans.  If clinically indicated additional testing with an alternate test  methodology [phone]) is advised. The SARS-CoV-2 RNA is generally  detectable in upper and lower respiratory sp ecimens during the acute  phase of infection. The expected result is Negative. Fact Sheet for Patients:  StrictlyIdeas.no Fact Sheet for Healthcare Providers: BankingDealers.co.za This test is not yet approved or cleared by the [place] FDA and has been authorized for detection and/or diagnosis of SARS-CoV-2 by FDA under an Emergency Use Authorization (EUA).  This EUA will remain in effect (meaning this test can be used) for the duration of the COVID-19 declaration under Section 564(b)(1) of the Act, 21 U.S.C. section 360bbb-3(b)(1), unless the authorization is terminated or revoked sooner. Performed at [facility], [place] [address] [place], [place] [address]   Culture, blood (routine x 2)     Status: None (Preliminary result)   Collection Time: 12/24/18  5:22 PM   Specimen: BLOOD  Result Value Ref Range Status   Specimen Description   Final    BLOOD LEFT ANTECUBITAL Performed at [name], [place] [address], [place], [place] [address]    Special Requests   Final    BOTTLES DRAWN AEROBIC AND ANAEROBIC Blood Culture results may not be optimal due to an excessive volume of blood received in culture bottles Performed at [place] [address] [place], [place] [address]    Culture   Final    NO GROWTH 4 DAYS Performed at [facility], [place] [address], [place], [place] [address]    Report Status PENDING  Incomplete  Culture, blood (routine x 2)     Status: None (Preliminary result)   Collection Time: 12/24/18  5:29 PM   Specimen: BLOOD  Result Value Ref Range Status   Specimen Description   Final    BLOOD RIGHT ANTECUBITAL Performed at [facility], [place] [address] [place], [place] [address]    Special Requests   Final    BOTTLES DRAWN AEROBIC AND ANAEROBIC Blood Culture adequate volume Performed at [place] [address] [place], [place] [address]    Culture   Final    NO GROWTH 4 DAYS Performed at [facility], [place] [address], [place],  [address]    Report Status PENDING  Incomplete     Radiological Exams on Admission: Dg Chest 2 View  Result Date: 12/28/2018 CLINICAL DATA:  Sepsis EXAM: CHEST - 2 VIEW COMPARISON:  December 24, 2018 FINDINGS: The right-sided Port-A-Cath has been removed. There is a new right-sided PICC  line with tip terminating over the distal SVC. The cardiac size is stable. There is no pneumothorax. No large pleural effusion. The lung volumes are slightly low. IMPRESSION: No active cardiopulmonary disease. Electronically Signed   By: Constance Holster M.D.   On: 12/28/2018 18:53   Ir Removal [employer] W/ [place] W/o Fl Mod Sed  Result Date: 12/27/2018 CLINICAL DATA:  Bacteremia. Indwelling right IJ port catheter placed 08/30/2017 by Dr. Pascal Lux, has been working well without evident complication , removal requested EXAM: EXAM TUNNELED PORT CATHETER REMOVAL TECHNIQUE: The procedure, risks (including but not limited to bleeding, infection, organ damage ), benefits, and alternatives were explained to the patient. Questions regarding the procedure were encouraged and answered. The patient understands and consents to the procedure. Intravenous Fentanyl 137mg and Versed '4mg'$  were administered as conscious sedation during continuous monitoring of the patient's level of consciousness  and physiological / cardiorespiratory status by the radiology RN, with a total moderate sedation time of 37 minutes. Overlying skin prepped with chlorhexidine, draped in usual sterile fashion, infiltrated locally with 1% lidocaine. A small incision was made over the scar from previous placement. The port catheter was dissected free from the underlying soft tissues and removed intact. There is no gross purulence. Hemostasis was achieved. The port pocket was closed with deep interrupted and subcuticular continuous 3-0 Monocryl sutures, then covered with Dermabond. The patient tolerated the procedure well. COMPLICATIONS: COMPLICATIONS None immediate IMPRESSION: 1.  Technically successful tunneled Port catheter removal. Electronically Signed   By: DLucrezia EuropeM.D.   On: 12/27/2018 16:14   Ir Picc Placement Right >5 Yrs Inc Img Guide  Result Date: 12/27/2018 CLINICAL DATA:  Bacteremia, needs venous access for planned chemotherapy Monday EXAM: PICC PLACEMENT WITH ULTRASOUND AND FLUOROSCOPY FLUOROSCOPY TIME:  6 seconds, 1 mGy TECHNIQUE: After written informed consent was obtained, patient was placed in the supine position on angiographic table. Patency of the right basilic vein was confirmed with ultrasound with image documentation. An appropriate skin site was determined. Skin site was marked. Region was prepped using maximum barrier technique including cap and mask, sterile gown, sterile gloves, large sterile sheet, and Chlorhexidine as cutaneous antisepsis. The region was infiltrated locally with 1% lidocaine. Under real-time ultrasound guidance, the right basilic vein was accessed with a 21 gauge micropuncture needle; the needle tip within the vein was confirmed with ultrasound image documentation. Needle exchanged over a 018 guidewire for a peel-away sheath, through which a 5-French single-lumen power injectable PICC trimmed to 46cm was advanced, positioned with its tip near the cavoatrial junction. Spot chest  radiograph confirms appropriate catheter position. Catheter was flushed per protocol and secured externally. The patient tolerated procedure well. COMPLICATIONS: COMPLICATIONS none IMPRESSION: 1. Technically successful five FP[employer] lumen power injectable PICC placement Electronically Signed   By: DLucrezia EuropeM.D.   On: 12/27/2018 16:15    EKG: Normal sinus rhythm at 79 bpm with normal axis and nonspecific ST-T wave changes.   Assessment/Plan  This is a 63y.o. male with a history of pancreatic cancer with mets now being admitted with:  #. Sepsis secondary to unclear source - Admit to inpatient with telemetry monitoring - IV antibiotics: Maxipime/Vanco/Flagyl - IV fluid hydration - Follow up blood,urine & sputum cultures - Repeat CBC in am. Trend lactate - Please call Infectious disease for consultation, and sitter GI consult versus IR for evaluation of biliary stent   #. Normocytic anemia - Check ferritin, B12, folate, FOBT, iron/TIBC  #.  Mild hypokalemia -Replace  orally -Check magnesium level  #.  Pancreatic cancer with metastases - Pain control  #. History of hypertension - Continue Norvasc, Cardura, Avapro  #. History of BPH - Continue Flomax  #. History of neuropathy - Continue gabapentin  Admission status: Inpatient IV Fluids: Normal saline Diet/Nutrition: Heart healthy Consults called: Please call ID [id number] Px: Lovenox, SCDs and early ambulation. Code Status: Full Code  Disposition Plan: To be determined  All the records are reviewed and case discussed with ED provider. Management plans discussed with the patient and/or family who express understanding and agree with plan of care.  Climmie Cronce D.O. on 12/28/2018 at 7:42 PM CC: Primary care physician; Mancel Bale, PA-C   12/28/2018, 7:42 PM

## 2018-12-28 NOTE — ED Notes (Signed)
Patient transported to X-ray 

## 2018-12-28 NOTE — Telephone Encounter (Signed)
Called to request refill on K+ and oxycodone/apap 10/325 (has #4 left). Also asking if he still needs to come in for chemo on Monday? Both scripts refilled and per Dr. Benay Spice: still wants to see him Monday and may have chemo based on how he is doing. She reports he had his 1st dose of the IV ceftriaxone today.

## 2018-12-28 NOTE — ED Notes (Signed)
Bed: WA13 Expected date:  Expected time:  Means of arrival:  Comments: Triage 3 

## 2018-12-28 NOTE — ED Triage Notes (Signed)
Patient reports that he began having a fever x 4 days. Patient reported an abnormal lab,but did not know what labs.  T. 103.3 in triage.  Patient reports recent hospitalization and was discharged yesterday. Patient states he has been tested x 4 for covid-19 and has been negative x 4.

## 2018-12-28 NOTE — ED Provider Notes (Signed)
[facility] DEPT Provider Note   CSN: [id number] Arrival date & time: 12/28/18  1625    History   Chief Complaint Chief Complaint  Patient presents with  . Fever  . Abnormal Lab  . Cancer patient    HPI Cory Kelly is a 63 y.o. male.     Patient is a 64 year old male who presents with a fever.  He has a history of metastatic pancreatic cancer with a biliary duct stent.  He was recently admitted for sepsis from June 22 to June 24.  His blood cultures drew out Streptococcus and he was discharged on Rocephin through a PICC line.  He has had a second blood culture that is positive for gram-negative rods but has not yet been identified.  Today he was driving back from [place] with his wife and he became confused and was having rigors.  He called his oncologist who advised him to come to the emergency room.  He was noted to have a fever of 103.  He denies any cough or cold symptoms.  He has some abdominal distention with some mild ascites which is baseline for him.  He denies any worsening abdominal pain.  No vomiting or diarrhea.  No urinary symptoms.  His portacatheter was removed during his last hospitalization thinking that that may be source of infection.  He had a small amount of ascites which was unable to be tapped.     Past Medical History:  Diagnosis Date  . Abdominal pain    due to bloating  . Bloating   . Cancer ([place])    skin cancer  . Essential hypertension   . Fatigue   . History of kidney stones    passed  . History of weight loss   . HOH (hard of hearing)    no hearing aids  . Neuralgia   . Pancreatic cancer ([place])   . Stress    loss of father in Dec 2018.    Patient Active Problem List   Diagnosis Date Noted  . Sepsis ([place]) 12/24/2018  . Bacteremia   . Fever, unspecified 11/05/2018  . Episode of transient neurologic symptoms 11/04/2018  . Biliary stent migration, initial encounter   . Pancreatic cancer ([place]) 07/20/2018   . 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 Jaundice 07/20/2018  . History of ERCP 07/20/2018  . Mass of pancreas 07/20/2018  . Goals of care, counseling/discussion 07/04/2018  . Cholangitis 06/22/2018  . Port-A-Cath in place 08/31/2017  . 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[place]) 08/21/2017  . Abnormal pancreas function test   . Pancreatic abnormality   . 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 Elevated LFTs 08/04/2017  . 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 Elevated serum creatinine 11/20/2012  . Elevated PSA 03/05/2012  . Essential hypertension 11/02/2011  . Hyperlipemia 11/02/2011    Past Surgical History:  Procedure Laterality Date  . APPENDECTOMY    . BILIARY STENT PLACEMENT N/A 06/25/2018   Procedure: BILIARY STENT PLACEMENT;  Surgeon: Rush Landmark Telford Nab., MD;  Location: [facility] ENDOSCOPY;  Service: Gastroenterology;  Laterality: N/A;  . BILIARY STENT PLACEMENT  07/20/2018   Procedure: BILIARY STENT PLACEMENT;  Surgeon: Milus Banister, MD;  Location[facility] END[facility]COPY;  Service: Gastroenterology;;  . BILIARY STENT PLACEMENT N/A 08/16/2018   Procedure: BILIARY STENT PLACEMENT;  Surgeon: Milus Banister, MD;  Location: [facility] ENDOSCOPY;  Service: Endoscopy;  Laterality: N/A;  . ENDOSCOPIC RETROGRADE CHOLANGIOPANCREATOGRAPHY (ERCP) WITH PROPOFOL N/A 08/10/2017   Procedure: ENDOSCOPIC RETROGRADE  CHOLANGIOPANCREATOGRAPHY (ERCP) WITH PROPOFOL;  Surgeon: Milus Banister, MD;  Location: [facility] ENDOSCOPY;  Service: Endoscopy;  Laterality: N/A;  . ENDOSCOPIC RETROGRADE CHOLANGIOPANCREATOGRAPHY (ERCP) WITH PROPOFOL N/A 07/20/2018   Procedure: ENDOSCOPIC RETROGRADE CHOLANGIOPANCREATOGRAPHY (ERCP) WITH PROPOFOL;  Surgeon: Milus Banister, MD;  Location: [facility] ENDOSCOPY;  Service: Gastroenterology;  Laterality: N/A;  Balloon Sweeps  . ENDOSCOPIC RETROGRADE CHOLANGIOPANCREATOGRAPHY (ERCP) WITH PROPOFOL N/A 08/16/2018   Procedure: ENDOSCOPIC RETROGRADE CHOLANGIOPANCREATOGRAPHY (ERCP) WITH PROPOFOL;  Surgeon:  Milus Banister, MD;  Location: [facility] ENDOSCOPY;  Service: Endoscopy;  Laterality: N/A;  . ENDOSCOPIC RETROGRADE CHOLANGIOPANCREATOGRAPHY (ERCP) WITH PROPOFOL N/A 12/06/2018   Procedure: ENDOSCOPIC RETROGRADE CHOLANGIOPANCREATOGRAPHY (ERCP) WITH PROPOFOL;  Surgeon: Milus Banister, MD;  Location: [facility] ENDOSCOPY;  Service: Endoscopy;  Laterality: N/A;  cbd stent sweep  . ERCP N/A 06/25/2018   Procedure: ENDOSCOPIC RETROGRADE CHOLANGIOPANCREATOGRAPHY (ERCP);  Surgeon: Irving Copas., MD;  Location: [facility] ENDOSCOPY;  Service: Gastroenterology;  Laterality: N/A;  . EUS N/A 08/10/2017   Procedure: UPPER ENDOSCOPIC ULTRASOUND (EUS) RADIAL;  Surgeon: Milus Banister, MD;  Location: [facility] ENDOSCOPY;  Service: Endoscopy;  Laterality: N/A;  . EYE SURGERY     lasik/left eye  . IR FLUORO GUIDE PORT INSERTION RIGHT  08/30/2017  . IR REMOVAL TUN ACCESS W/ PORT W/O FL MOD SED  12/27/2018  . IR US GUIDE VASC ACCESS RIGHT  08/30/2017  . REMOVAL OF STONES  06/25/2018   Procedure: REMOVAL OF STONES;  Surgeon: Rush Landmark Telford Nab., MD;  Location: [facility] ENDOSCOPY;  Service: Gastroenterology;;  . SKIN CANCER EXCISION    . STENT REMOVAL  07/20/2018   Procedure: STENT REMOVAL;  Surgeon: Milus Banister, MD;  Location: [facility] ENDOSCOPY;  Service: Gastroenterology;;  . Lavell Islam REMOVAL  08/16/2018   Procedure: STENT REMOVAL;  Surgeon: Milus Banister, MD;  Location: [facility] ENDOSCOPY;  Service: Endoscopy;;  . TEE WITHOUT CARDIOVERSION N/A 11/08/2018   Procedure: TRANSESOPHAGEAL ECHOCARDIOGRAM (TEE);  Surgeon: Acie Fredrickson Wonda Cheng, MD;  Location: [facility] ENDOSCOPY;  Service: Cardiovascular;  Laterality: N/A;        Home Medications    Prior to Admission medications   Medication Sig Start Date End Date Taking? Authorizing Provider  amLODipine (NORVASC) 10 MG tablet Take 10 mg by mouth at bedtime.  05/21/18   [provider]  cefTRIAXone (ROCEPHIN) IVPB Inject 2 g into the vein daily for 10 days. Indication:  Strep bacteremia Last  Day of Therapy:  01/06/2019 Labs - Once weekly:  CBC/D and BMP, Labs - Every other week:  ESR and CRP 12/27/18 01/06/19  Amin, Jeanella Flattery, MD  ciprofloxacin (CIPRO) 500 MG tablet Take 1 tablet (500 mg total) by mouth 2 (two) times daily. 12/17/18   Milton Ferguson, MD  doxazosin (CARDURA) 1 MG tablet Take 1 mg by mouth at bedtime.    [provider]  gabapentin (NEURONTIN) 100 MG capsule Take 2 capsules (200 mg total) by mouth 2 (two) times daily. 12/03/18   Owens Shark, NP  lidocaine-prilocaine (EMLA) cream Apply 1 application topically as needed. Apply to portacath site 1-2 hours prior to use Patient taking differently: Apply 1 application topically daily as needed (1-2 hours prior to portacath access).  08/20/18   Owens Shark, NP  loperamide (IMODIUM A-D) 2 MG tablet Take 1 tablet (2 mg total) by mouth as directed. Patient taking differently: Take 2-4 mg by mouth 3 (three) times daily as needed for diarrhea or loose stools.  08/22/18   Ladell Pier, MD  olmesartan (BENICAR) 40 MG  tablet Take 1 tablet (40 mg total) by mouth daily. 04/04/18   Gale Journey, Damaris Hippo, PA-C  ondansetron (ZOFRAN ODT) 4 MG disintegrating tablet '4mg'$  ODT q4 hours prn nausea/vomit Patient not taking: Reported on 12/24/2018 12/17/18   Milton Ferguson, MD  ondansetron (ZOFRAN) 4 MG tablet Take 1 tablet (4 mg total) by mouth every 6 (six) hours as needed for nausea. 06/25/18   Roxan Hockey, MD  oxyCODONE-acetaminophen (PERCOCET) 10-325 MG tablet Take 1 tablet by mouth every 4 (four) hours as needed for pain. 12/28/18   Owens Shark, NP  potassium chloride SA (K-DUR) 20 MEQ tablet Take 1 tablet (20 mEq total) by mouth 2 (two) times daily. 12/28/18   Ladell Pier, MD  [employer] ACETATE EX Apply 1 application topically daily as needed (itching).    [provider]  prochlorperazine (COMPAZINE) 10 MG tablet Take 10 mg by mouth every 6 (six) hours as needed for nausea or vomiting.    [provider]   tamsulosin (FLOMAX) 0.4 MG CAPS capsule Take 1 capsule (0.4 mg total) by mouth daily after supper. 12/27/18   Damita Lack, MD    Family History Family History  Problem Relation Age of Onset  . Hyperlipidemia Father   . Heart disease Father   . Dementia Father   . Memory loss Mother   . Colon cancer Neg Hx   . Esophageal cancer Neg Hx   . Inflammatory bowel disease Neg Hx   . Liver disease Neg Hx   . Rectal cancer Neg Hx   . Stomach cancer Neg Hx   . Pancreatic cancer Neg Hx     Social History Social History   Tobacco Use  . Smoking status: Never Smoker  . Smokeless tobacco: Never Used  Substance Use Topics  . Alcohol use: No  . Drug use: No     Allergies   Patient has no known allergies.   Review of Systems Review of Systems  Constitutional: Positive for chills, fatigue and fever. Negative for diaphoresis.  HENT: Negative for congestion, rhinorrhea and sneezing.   Eyes: Negative.   Respiratory: Negative for cough, chest tightness and shortness of breath.   Cardiovascular: Negative for chest pain and leg swelling.  Gastrointestinal: Positive for abdominal distention. Negative for abdominal pain, blood in stool, diarrhea, nausea and vomiting.  Genitourinary: Negative for difficulty urinating, flank pain, frequency and hematuria.  Musculoskeletal: Negative for arthralgias and back pain.  Skin: Negative for rash.  Neurological: Negative for dizziness, speech difficulty, weakness, numbness and headaches.  Psychiatric/Behavioral: Positive for confusion.     Physical Exam Updated Vital Signs BP 112/62   Pulse 71   Temp (!) 103.3 F (39.6 C) (Oral)   Resp 16   Ht '6\' 5"'$  (1.956 m)   Wt 88.6 kg   SpO2 97%   BMI 23.16 kg/m   Physical Exam Constitutional:      Appearance: He is well-developed.  HENT:     Head: Normocephalic and atraumatic.  Eyes:     Pupils: Pupils are equal, round, and reactive to light.  Neck:     Musculoskeletal: Normal range of  motion and neck supple.  Cardiovascular:     Rate and Rhythm: Normal rate and regular rhythm.     Heart sounds: Normal heart sounds.  Pulmonary:     Effort: Pulmonary effort is normal. No respiratory distress.     Breath sounds: Normal breath sounds. No wheezing or rales.  Chest:     Chest wall:  No tenderness.  Abdominal:     General: Bowel sounds are normal. There is distension (Mild distention).     Palpations: Abdomen is soft.     Tenderness: There is no abdominal tenderness. There is no guarding or rebound.  Musculoskeletal: Normal range of motion.  Lymphadenopathy:     Cervical: No cervical adenopathy.  Skin:    General: Skin is warm and dry.     Findings: No rash.  Neurological:     General: No focal deficit present.     Mental Status: He is alert and oriented to person, place, and time.      ED Treatments / Results  Labs (all labs ordered are listed, but only abnormal results are displayed) Labs Reviewed  COMPREHENSIVE METABOLIC PANEL - Abnormal; Notable for the following components:      Result Value   Sodium 132 (*)    Potassium 3.1 (*)    Glucose, Bld 120 (*)    Calcium 7.8 (*)    Total Protein 5.0 (*)    Albumin 2.4 (*)    ALT 51 (*)    Alkaline Phosphatase 525 (*)    All other components within normal limits  CBC WITH DIFFERENTIAL/PLATELET - Abnormal; Notable for the following components:   RBC 3.26 (*)    Hemoglobin 9.3 (*)    HCT 27.7 (*)    Platelets 97 (*)    Lymphs Abs 0.1 (*)    All other components within normal limits  CULTURE, BLOOD (ROUTINE X 2)  CULTURE, BLOOD (ROUTINE X 2)  SARS CORONAVIRUS 2 (HOSPITAL ORDER, [place])  LACTIC ACID, PLASMA  PROTIME-INR  LACTIC ACID, PLASMA  URINALYSIS, ROUTINE W REFLEX MICROSCOPIC    EKG EKG Interpretation  Date/Time:  Friday December 28 2018 17:56:43 EDT Ventricular Rate:  79 PR Interval:    QRS Duration: 103 QT Interval:  418 QTC Calculation: 480 R Axis:   73 Text  Interpretation:  Sinus rhythm Low voltage, precordial leads Borderline prolonged QT interval since last tracing no significant change Confirmed by Malvin Johns [phone]) on 12/28/2018 6:46:11 PM   Radiology Dg Chest 2 View  Result Date: 12/28/2018 CLINICAL DATA:  Sepsis EXAM: CHEST - 2 VIEW COMPARISON:  December 24, 2018 FINDINGS: The right-sided Port-A-Cath has been removed. There is a new right-sided PICC line with tip terminating over the distal SVC. The cardiac size is stable. There is no pneumothorax. No large pleural effusion. The lung volumes are slightly low. IMPRESSION: No active cardiopulmonary disease. Electronically Signed   By: Constance Holster M.D.   On: 12/28/2018 18:53   Ir Removal [employer] W/ [place] W/o Fl Mod Sed  Result Date: 12/27/2018 CLINICAL DATA:  Bacteremia. Indwelling right IJ port catheter placed 08/30/2017 by Dr. Pascal Lux, has been working well without evident complication , removal requested EXAM: EXAM TUNNELED PORT CATHETER REMOVAL TECHNIQUE: The procedure, risks (including but not limited to bleeding, infection, organ damage ), benefits, and alternatives were explained to the patient. Questions regarding the procedure were encouraged and answered. The patient understands and consents to the procedure. Intravenous Fentanyl 115mg and Versed '4mg'$  were administered as conscious sedation during continuous monitoring of the patient's level of consciousness and physiological / cardiorespiratory status by the radiology RN, with a total moderate sedation time of 37 minutes. Overlying skin prepped with chlorhexidine, draped in usual sterile fashion, infiltrated locally with 1% lidocaine. A small incision was made over the scar from previous placement. The port catheter was dissected free  from the underlying soft tissues and removed intact. There is no gross purulence. Hemostasis was achieved. The port pocket was closed with deep interrupted and subcuticular continuous 3-0 Monocryl sutures,  then covered with Dermabond. The patient tolerated the procedure well. COMPLICATIONS: COMPLICATIONS None immediate IMPRESSION: 1.  Technically successful tunneled Port catheter removal. Electronically Signed   By: Lucrezia Europe M.D.   On: 12/27/2018 16:14   Ir Picc Placement Right >5 Yrs Inc Img Guide  Result Date: 12/27/2018 CLINICAL DATA:  Bacteremia, needs venous access for planned chemotherapy Monday EXAM: PICC PLACEMENT WITH ULTRASOUND AND FLUOROSCOPY FLUOROSCOPY TIME:  6 seconds, 1 mGy TECHNIQUE: After written informed consent was obtained, patient was placed in the supine position on angiographic table. Patency of the right basilic vein was confirmed with ultrasound with image documentation. An appropriate skin site was determined. Skin site was marked. Region was prepped using maximum barrier technique including cap and mask, sterile gown, sterile gloves, large sterile sheet, and Chlorhexidine as cutaneous antisepsis. The region was infiltrated locally with 1% lidocaine. Under real-time ultrasound guidance, the right basilic vein was accessed with a 21 gauge micropuncture needle; the needle tip within the vein was confirmed with ultrasound image documentation. Needle exchanged over a 018 guidewire for a peel-away sheath, through which a 5-French single-lumen power injectable PICC trimmed to 46cm was advanced, positioned with its tip near the cavoatrial junction. Spot chest radiograph confirms appropriate catheter position. Catheter was flushed per protocol and secured externally. The patient tolerated procedure well. COMPLICATIONS: COMPLICATIONS none IMPRESSION: 1. Technically successful five [place] single lumen power injectable PICC placement Electronically Signed   By: Lucrezia Europe M.D.   On: 12/27/2018 16:15    Procedures Procedures (including critical care time)  Medications Ordered in ED Medications  vancomycin (VANCOCIN) IVPB 1000 mg/200 mL premix (1,000 mg Intravenous New Bag/Given 12/28/18  1848)  sodium chloride flush (NS) 0.9 % injection 3 mL (3 mLs Intravenous Given 12/28/18 1848)  sodium chloride 0.9 % bolus 1,000 mL (0 mLs Intravenous Stopped 12/28/18 1916)  piperacillin-tazobactam (ZOSYN) IVPB 3.375 g (0 g Intravenous Stopped 12/28/18 1847)     Initial Impression / Assessment and Plan / ED Course  I have reviewed the triage vital signs and the nursing notes.  Pertinent labs & imaging results that were available during my care of the patient were reviewed by me and considered in my medical decision making (see chart for details).        Patient is a 63 year old male who presents with fever.  He was initially slightly hypotensive on arrival but his blood pressure has improved with IV fluids.  His lactate is normal.  His white count is normal.  His other labs are similar to baseline values.  He was started on broad-spectrum antibiotics including vancomycin and Zosyn.  Blood cultures were drawn.  Chest x-ray does not show any evidence of pneumonia.  I spoke with Dr. Almyra Free who will admit the pt for further treatment.  CRITICAL CARE Performed by: Malvin Johns Total critical care time: 45 minutes Critical care time was exclusive of separately billable procedures and treating other patients. Critical care was necessary to treat or prevent imminent or life-threatening deterioration. Critical care was time spent personally by me on the following activities: development of treatment plan with patient and/or surrogate as well as nursing, discussions with consultants, evaluation of patient's response to treatment, examination of patient, obtaining history from patient or surrogate, ordering and performing treatments and interventions, ordering and review of laboratory studies,  ordering and review of radiographic studies, pulse oximetry and re-evaluation of patient's condition.   Final Clinical Impressions(s) / ED Diagnoses   Final diagnoses:  Sepsis without acute organ  dysfunction, due to unspecified organism [facility])    ED Discharge Orders    None       Malvin Johns, MD 12/28/18 1946

## 2018-12-28 NOTE — ED Notes (Signed)
ED TO INPATIENT HANDOFF REPORT  ED Nurse Name and Phone #: Christinia Gully Name/Age/Gender [name] 63 y.o. male Room/Bed: WA13/WA13  Code Status   Code Status: Prior  Home/SNF/Other given to floor Patient oriented to: self, place, time and situation Is this baseline? Yes   Triage Complete: Triage complete  Chief Complaint CAncer pt,Fever, Abnormal Lab  Triage Note Patient reports that he began having a fever x 4 days. Patient reported an abnormal lab,but did not know what labs.  T. 103.3 in triage.  Patient reports recent hospitalization and was discharged yesterday. Patient states he has been tested x 4 for covid-19 and has been negative x 4.   Allergies No Known Allergies  Level of Care/Admitting Diagnosis ED Disposition    ED Disposition Condition Comment   Admit  Hospital Area: [facility] [[id number]]  Level of Care: Telemetry [5]  Admit to tele based on following criteria: Other see comments  Comments: Sepsis unclear source  Covid Evaluation: Person Under Investigation (PUI)  Isolation Risk Level: Low Risk/Droplet (Less than 4L [place] supplementation)  Diagnosis: Sepsis [facility]) [[id number]]  Admitting Physician: Harvie Bridge [[id number]]  Attending Physician: Sherron Monday  Estimated length of stay: past midnight tomorrow  Certification:: I certify this patient will need inpatient services for at least 2 midnights  PT Class (Do Not Modify): Inpatient [101]  PT Acc Code (Do Not Modify): Private [1]       B Medical/Surgery History Past Medical History:  Diagnosis Date  . Abdominal pain    due to bloating  . Bloating   . Cancer ([place])    skin cancer  . Essential hypertension   . Fatigue   . History of kidney stones    passed  . History of weight loss   . HOH (hard of hearing)    no hearing aids  . Neuralgia   . Pancreatic cancer ([place])   . Stress    loss of father in Dec 2018.   Past Surgical History:  Procedure  Laterality Date  . APPENDECTOMY    . BILIARY STENT PLACEMENT N/A 06/25/2018   Procedure: BILIARY STENT PLACEMENT;  Surgeon: Rush Landmark Telford Nab., MD;  Location: [facility] ENDOSCOPY;  Service: Gastroenterology;  Laterality: N/A;  . BILIARY STENT PLACEMENT  07/20/2018   Procedure: BILIARY STENT PLACEMENT;  Surgeon: Milus Banister, MD;  Location: [facility] ENDOSCOPY;  Service: Gastroenterology;;  . BILIARY STENT PLACEMENT N/A 08/16/2018   Procedure: BILIARY STENT PLACEMENT;  Surgeon: Milus Banister, MD;  Location: [facility] ENDOSCOPY;  Service: Endoscopy;  Laterality: N/A;  . ENDOSCOPIC RETROGRADE CHOLANGIOPANCREATOGRAPHY (ERCP) WITH PROPOFOL N/A 08/10/2017   Procedure: ENDOSCOPIC RETROGRADE CHOLANGIOPANCREATOGRAPHY (ERCP) WITH PROPOFOL;  Surgeon: Milus Banister, MD;  Location: [facility] ENDOSCOPY;  Service: Endoscopy;  Laterality: N/A;  . ENDOSCOPIC RETROGRADE CHOLANGIOPANCREATOGRAPHY (ERCP) WITH PROPOFOL N/A 07/20/2018   Procedure: ENDOSCOPIC RETROGRADE CHOLANGIOPANCREATOGRAPHY (ERCP) WITH PROPOFOL;  Surgeon: Milus Banister, MD;  Location: [facility] ENDOSCOPY;  Service: Gastroenterology;  Laterality: N/A;  Balloon Sweeps  . ENDOSCOPIC RETROGRADE CHOLANGIOPANCREATOGRAPHY (ERCP) WITH PROPOFOL N/A 08/16/2018   Procedure: ENDOSCOPIC RETROGRADE CHOLANGIOPANCREATOGRAPHY (ERCP) WITH PROPOFOL;  Surgeon: Milus Banister, MD;  Location: [facility] ENDOSCOPY;  Service: Endoscopy;  Laterality: N/A;  . ENDOSCOPIC RETROGRADE CHOLANGIOPANCREATOGRAPHY (ERCP) WITH PROPOFOL N/A 12/06/2018   Procedure: ENDOSCOPIC RETROGRADE CHOLANGIOPANCREATOGRAPHY (ERCP) WITH PROPOFOL;  Surgeon: Milus Banister, MD;  Location: [facility] ENDOSCOPY;  Service: Endoscopy;  Laterality: N/A;  cbd stent sweep  . ERCP N/A 06/25/2018   Procedure: ENDOSCOPIC RETROGRADE CHOLANGIOPANCREATOGRAPHY (ERCP);  Surgeon: Irving Copas., MD;  Location: [facility];  Service: Gastroenterology;  Laterality: N/A;  . EUS N/A 08/10/2017   Procedure: UPPER ENDOSCOPIC ULTRASOUND (EUS) RADIAL;   Surgeon: Milus Banister, MD;  Location: [facility] ENDOSCOPY;  Service: Endoscopy;  Laterality: N/A;  . EYE SURGERY     lasik/left eye  . IR FLUORO GUIDE PORT INSERTION RIGHT  08/30/2017  . IR REMOVAL TUN ACCESS W/ PORT W/O FL MOD SED  12/27/2018  . IR US GUIDE VASC ACCESS RIGHT  08/30/2017  . REMOVAL OF STONES  06/25/2018   Procedure: REMOVAL OF STONES;  Surgeon: Rush Landmark Telford Nab., MD;  Location: [facility] ENDOSCOPY;  Service: Gastroenterology;;  . SKIN CANCER EXCISION    . STENT REMOVAL  07/20/2018   Procedure: STENT REMOVAL;  Surgeon: Milus Banister, MD;  Location: [facility] ENDOSCOPY;  Service: Gastroenterology;;  . Lavell Islam REMOVAL  08/16/2018   Procedure: STENT REMOVAL;  Surgeon: Milus Banister, MD;  Location: [facility] ENDOSCOPY;  Service: Endoscopy;;  . TEE WITHOUT CARDIOVERSION N/A 11/08/2018   Procedure: TRANSESOPHAGEAL ECHOCARDIOGRAM (TEE);  Surgeon: Acie Fredrickson Wonda Cheng, MD;  Location: [facility] ENDOSCOPY;  Service: Cardiovascular;  Laterality: N/A;     A IV Location/Drains/Wounds Patient Lines/Drains/Airways Status   Active Line/Drains/Airways    Name:   Placement date:   Placement time:   Site:   Days:   Peripheral IV 12/28/18 Left Forearm   12/28/18    1812    Forearm   less than 1   PICC Single Lumen 12/27/18 PICC Right Brachial 46 cm   12/27/18    1340    Brachial   1   GI Stent 10 Fr.   08/16/18    1200    -   134          Intake/Output Last 24 hours  Intake/Output Summary (Last 24 hours) at 12/28/2018 2204 Last data filed at 12/28/2018 1953 Gross per 24 hour  Intake 1303 ml  Output -  Net 1303 ml    Labs/Imaging Results for orders placed or performed during the hospital encounter of 12/28/18 (from the past 48 hour(s))  Lactic acid, plasma     Status: None   Collection Time: 12/28/18  5:08 PM  Result Value Ref Range   Lactic Acid, Venous 1.0 0.5 - 1.9 mmol/L    Comment: Performed at [facility], [place] [address] [place], [place] [address]  Comprehensive metabolic panel      Status: Abnormal   Collection Time: 12/28/18  5:50 PM  Result Value Ref Range   Sodium 132 (L) 135 - 145 mmol/L   Potassium 3.1 (L) 3.5 - 5.1 mmol/L   Chloride 99 98 - 111 mmol/L   CO2 24 22 - 32 mmol/L   Glucose, Bld 120 (H) 70 - 99 mg/dL   BUN 9 8 - 23 mg/dL   Creatinine, Ser 0.88 0.61 - 1.24 mg/dL   Calcium 7.8 (L) 8.9 - 10.3 mg/dL   Total Protein 5.0 (L) 6.5 - 8.1 g/dL   Albumin 2.4 (L) 3.5 - 5.0 g/dL   AST 35 15 - 41 U/L   ALT 51 (H) 0 - 44 U/L   Alkaline Phosphatase 525 (H) 38 - 126 U/L   Total Bilirubin 0.7 0.3 - 1.2 mg/dL   GFR calc non Af Amer >60 >60 mL/min   GFR calc Af Amer >60 >60 mL/min   Anion gap 9 5 - 15    Comment: Performed at [facility], [address] [place], [place] [address]  CBC with Differential     Status: Abnormal   Collection Time: 12/28/18  5:50 PM  Result Value Ref Range   WBC 5.7 4.0 - 10.5 K/uL    Comment: REPEATED TO VERIFY WHITE COUNT CONFIRMED ON SMEAR    RBC 3.26 (L) 4.22 - 5.81 MIL/uL   Hemoglobin 9.3 (L) 13.0 - 17.0 g/dL   HCT 27.7 (L) 39.0 - 52.0 %   MCV 85.0 80.0 - 100.0 fL   MCH 28.5 26.0 - 34.0 pg   MCHC 33.6 30.0 - 36.0 g/dL   RDW 14.6 11.5 - 15.5 %   Platelets 97 (L) 150 - 400 K/uL    Comment: REPEATED TO VERIFY PLATELET COUNT CONFIRMED BY SMEAR SPECIMEN CHECKED FOR CLOTS Immature Platelet Fraction may be clinically indicated, consider ordering this additional test [id number]    nRBC 0.0 0.0 - 0.2 %   Neutrophils Relative % 92 %   Neutro Abs 5.3 1.7 - 7.7 K/uL   Lymphocytes Relative 1 %   Lymphs Abs 0.1 (L) 0.7 - 4.0 K/uL   Monocytes Relative 6 %   Monocytes Absolute 0.3 0.1 - 1.0 K/uL   Eosinophils Relative 0 %   Eosinophils Absolute 0.0 0.0 - 0.5 K/uL   Basophils Relative 0 %   Basophils Absolute 0.0 0.0 - 0.1 K/uL   WBC Morphology MORPHOLOGY UNREMARKABLE    Immature Granulocytes 1 %   Abs Immature Granulocytes 0.03 0.00 - 0.07 K/uL    Comment: Performed at [facility], [place] [address] [place], [place] [address]  Protime-INR     Status: None   Collection Time: 12/28/18  5:50 PM  Result Value Ref Range   Prothrombin Time 15.2 11.4 - 15.2 seconds   INR 1.2 0.8 - 1.2    Comment: (NOTE) INR goal varies based on device and disease states. Performed at [facility], [place] [address], [place], [place] [address]   Urinalysis, Routine w reflex microscopic     Status: Abnormal   Collection Time: 12/28/18  5:50 PM  Result Value Ref Range   Color, Urine YELLOW YELLOW   APPearance CLEAR CLEAR   Specific Gravity, Urine 1.016 1.005 - 1.030   pH 5.0 5.0 - 8.0   Glucose, UA 50 (A) NEGATIVE mg/dL   Hgb urine dipstick SMALL (A) NEGATIVE   Bilirubin Urine NEGATIVE NEGATIVE   Ketones, ur NEGATIVE NEGATIVE mg/dL   Protein, ur 30 (A) NEGATIVE mg/dL   Nitrite NEGATIVE NEGATIVE   Leukocytes,Ua NEGATIVE NEGATIVE   RBC / HPF 0-5 0 - 5 RBC/hpf   WBC, UA 0-5 0 - 5 WBC/hpf   Bacteria, UA NONE SEEN NONE SEEN   Squamous Epithelial / LPF 0-5 0 - 5   Mucus PRESENT     Comment: Performed at [facility], [place] [address] [place],  [address]  SARS Coronavirus 2 (CEPHEID - Performed in [place] hospital lab), Hosp Order     Status: None   Collection Time: 12/28/18  5:52 PM   Specimen: Nasopharyngeal Swab  Result Value Ref Range   SARS Coronavirus 2 NEGATIVE NEGATIVE    Comment: (NOTE) If result is NEGATIVE SARS-CoV-2 target nucleic acids are NOT DETECTED. The SARS-CoV-2 RNA is generally detectable in upper and lower  respiratory specimens during the acute phase of infection. The lowest  concentration of SARS-CoV-2 viral copies this assay can detect is 250  copies / mL. A negative result does not  preclude SARS-CoV-2 infection  and should not be used as the sole basis for treatment or other  patient management decisions.  A negative result may occur with  improper specimen collection / handling, submission of specimen other  than  nasopharyngeal swab, presence of viral mutation(s) within the  areas targeted by this assay, and inadequate number of viral copies  (<250 copies / mL). A negative result must be combined with clinical  observations, patient history, and epidemiological information. If result is POSITIVE SARS-CoV-2 target nucleic acids are DETECTED. The SARS-CoV-2 RNA is generally detectable in upper and lower  respiratory specimens dur ing the acute phase of infection.  Positive  results are indicative of active infection with SARS-CoV-2.  Clinical  correlation with patient history and other diagnostic information is  necessary to determine patient infection status.  Positive results do  not rule out bacterial infection or co-infection with other viruses. If result is PRESUMPTIVE POSTIVE SARS-CoV-2 nucleic acids MAY BE PRESENT.   A presumptive positive result was obtained on the submitted specimen  and confirmed on repeat testing.  While 2019 novel coronavirus  (SARS-CoV-2) nucleic acids may be present in the submitted sample  additional confirmatory testing may be necessary for epidemiological  and / or clinical management purposes  to differentiate between  SARS-CoV-2 and other Sarbecovirus currently known to infect humans.  If clinically indicated additional testing with an alternate test  methodology [phone]) is advised. The SARS-CoV-2 RNA is generally  detectable in upper and lower respiratory sp ecimens during the acute  phase of infection. The expected result is Negative. Fact Sheet for Patients:  StrictlyIdeas.no Fact Sheet for Healthcare Providers: BankingDealers.co.za This test is not yet approved or cleared by the [place] FDA and has been authorized for detection and/or diagnosis of SARS-CoV-2 by FDA under an Emergency Use Authorization (EUA).  This EUA will remain in effect (meaning this test can be used) for the duration of  the COVID-19 declaration under Section 564(b)(1) of the Act, 21 U.S.C. section 360bbb-3(b)(1), unless the authorization is terminated or revoked sooner. Performed at [facility], [place] [address], [place], [place] [address]    Dg Chest 2 View  Result Date: 12/28/2018 CLINICAL DATA:  Sepsis EXAM: CHEST - 2 VIEW COMPARISON:  December 24, 2018 FINDINGS: The right-sided Port-A-Cath has been removed. There is a new right-sided PICC line with tip terminating over the distal SVC. The cardiac size is stable. There is no pneumothorax. No large pleural effusion. The lung volumes are slightly low. IMPRESSION: No active cardiopulmonary disease. Electronically Signed   By: Constance Holster M.D.   On: 12/28/2018 18:53   Ir Removal [employer] W/ [place] W/o Fl Mod Sed  Result Date: 12/27/2018 CLINICAL DATA:  Bacteremia. Indwelling right IJ port catheter placed 08/30/2017 by Dr. Pascal Lux, has been working well without evident complication , removal requested EXAM: EXAM TUNNELED PORT CATHETER REMOVAL TECHNIQUE: The procedure, risks (including but not limited to bleeding, infection, organ damage ), benefits, and alternatives were explained to the patient. Questions regarding the procedure were encouraged and answered. The patient understands and consents to the procedure. Intravenous Fentanyl 153mcg and Versed 4mg  were administered as conscious sedation during continuous monitoring of the patient's level of consciousness and physiological / cardiorespiratory status by the radiology RN, with a total moderate sedation time of 37 minutes. Overlying skin prepped with chlorhexidine, draped in usual sterile fashion, infiltrated locally with 1% lidocaine. A small incision was made over the scar from previous placement. The  port catheter was dissected free from the underlying soft tissues and removed intact. There is no gross purulence. Hemostasis was achieved. The port pocket was closed with deep interrupted and  subcuticular continuous 3-0 Monocryl sutures, then covered with Dermabond. The patient tolerated the procedure well. COMPLICATIONS: COMPLICATIONS None immediate IMPRESSION: 1.  Technically successful tunneled Port catheter removal. Electronically Signed   By: Lucrezia Europe M.D.   On: 12/27/2018 16:14   Ir Picc Placement Right >5 Yrs Inc Img Guide  Result Date: 12/27/2018 CLINICAL DATA:  Bacteremia, needs venous access for planned chemotherapy Monday EXAM: PICC PLACEMENT WITH ULTRASOUND AND FLUOROSCOPY FLUOROSCOPY TIME:  6 seconds, 1 mGy TECHNIQUE: After written informed consent was obtained, patient was placed in the supine position on angiographic table. Patency of the right basilic vein was confirmed with ultrasound with image documentation. An appropriate skin site was determined. Skin site was marked. Region was prepped using maximum barrier technique including cap and mask, sterile gown, sterile gloves, large sterile sheet, and Chlorhexidine as cutaneous antisepsis. The region was infiltrated locally with 1% lidocaine. Under real-time ultrasound guidance, the right basilic vein was accessed with a 21 gauge micropuncture needle; the needle tip within the vein was confirmed with ultrasound image documentation. Needle exchanged over a 018 guidewire for a peel-away sheath, through which a 5-French single-lumen power injectable PICC trimmed to 46cm was advanced, positioned with its tip near the cavoatrial junction. Spot chest radiograph confirms appropriate catheter position. Catheter was flushed per protocol and secured externally. The patient tolerated procedure well. COMPLICATIONS: COMPLICATIONS none IMPRESSION: 1. Technically successful five [place] single lumen power injectable PICC placement Electronically Signed   By: Lucrezia Europe M.D.   On: 12/27/2018 16:15    Pending Labs Unresulted Labs (From admission, onward)    Start     Ordered   12/29/18 0500  Creatinine, serum  Daily,   R     12/28/18 2010    12/28/18 1708  Lactic acid, plasma  Now then every 2 hours,   STAT     12/28/18 1707   12/28/18 1708  Culture, blood (Routine x 2)  BLOOD CULTURE X 2,   STAT     12/28/18 1707   Signed and Held  Lactic acid, plasma  STAT Now then every 3 hours,   STAT     Signed and Held   Visual merchandiser and Held  Procalcitonin  ONCE - STAT,   R     Signed and Held   Signed and Held  Protime-INR  ONCE - STAT,   R     Signed and Held   Signed and Held  APTT  ONCE - STAT,   R     Signed and Held   Signed and Held  CBC  (enoxaparin (LOVENOX)    CrCl >/= 30 ml/min)  Once,   R    Comments: Baseline for enoxaparin therapy IF NOT ALREADY DRAWN.  Notify MD if PLT < 100 K.    Signed and Held   Signed and Held  Creatinine, serum  (enoxaparin (LOVENOX)    CrCl >/= 30 ml/min)  Once,   R    Comments: Baseline for enoxaparin therapy IF NOT ALREADY DRAWN.    Signed and Held   Signed and Held  Creatinine, serum  (enoxaparin (LOVENOX)    CrCl >/= 30 ml/min)  Weekly,   R    Comments: while on enoxaparin therapy    Signed and Held   Signed and Held  Magnesium  Add-on,   R     Signed and Held   Signed and Held  Phosphorus  Add-on,   R     Signed and Held   Signed and Occupational hygienist morning,   R     Signed and Held   Signed and Held  CBC  Tomorrow morning,   R     Signed and Held   Signed and Held  Culture, sputum-assessment  Once,   R    Question:  Patient immune status  Answer:  Immunocompromised   Signed and Held   Signed and Held  Ferritin  Once,   R     Signed and Held   Signed and Held  Iron and TIBC  Once,   R     Signed and Held   Signed and Held  Occult blood card to lab, stool  As needed,   R     Signed and Held   Signed and Held  Vitamin B12  Once,   R     Signed and Held   Signed and Held  Folate RBC  Once,   R     Signed and Held   Signed and Held  C difficile quick scan w PCR reflex  (C Difficile quick screen w PCR reflex panel)  Once, for 24 hours,   R     Signed and Held    Signed and Held  Stool culture (children & immunocomp patients)  Once,   R     Signed and Held          Vitals/Pain Today's Vitals   12/28/18 2000 12/28/18 2100 12/28/18 2130 12/28/18 2200  BP: 112/67 109/74 111/73 109/71  Pulse: 67 (!) 58 60 (!) 58  Resp: 15 13 (!) 9 (!) 21  Temp:      TempSrc:      SpO2: 98% 98% 100% 97%  Weight:      Height:      PainSc:        Isolation Precautions No active isolations  Medications Medications  vancomycin (VANCOCIN) 1,500 mg in sodium chloride 0.9 % 500 mL IVPB (has no administration in time range)  ceFEPIme (MAXIPIME) 2 g in sodium chloride 0.9 % 100 mL IVPB (has no administration in time range)  sodium chloride flush (NS) 0.9 % injection 3 mL (3 mLs Intravenous Given 12/28/18 1848)  sodium chloride 0.9 % bolus 1,000 mL (0 mLs Intravenous Stopped 12/28/18 1916)  vancomycin (VANCOCIN) IVPB 1000 mg/200 mL premix (0 mg Intravenous Stopped 12/28/18 1953)  piperacillin-tazobactam (ZOSYN) IVPB 3.375 g (0 g Intravenous Stopped 12/28/18 1847)    Mobility walks Low fall risk   Focused Assessments Cardiac Assessment Handoff:    No results found for: CKTOTAL, CKMB, CKMBINDEX, TROPONINI No results found for: DDIMER Does the Patient currently have chest pain? No      R Recommendations: See Admitting Provider Note  Report given to:   Additional Notes:

## 2018-12-29 ENCOUNTER — Inpatient Hospital Stay (HOSPITAL_COMMUNITY): Payer: BC Managed Care – PPO

## 2018-12-29 DIAGNOSIS — E785 Hyperlipidemia, unspecified: Secondary | ICD-10-CM

## 2018-12-29 DIAGNOSIS — R6520 Severe sepsis without septic shock: Secondary | ICD-10-CM

## 2018-12-29 DIAGNOSIS — I10 Essential (primary) hypertension: Secondary | ICD-10-CM

## 2018-12-29 DIAGNOSIS — I361 Nonrheumatic tricuspid (valve) insufficiency: Secondary | ICD-10-CM

## 2018-12-29 DIAGNOSIS — N179 Acute kidney failure, unspecified: Secondary | ICD-10-CM

## 2018-12-29 DIAGNOSIS — K8689 Other specified diseases of pancreas: Secondary | ICD-10-CM

## 2018-12-29 LAB — BASIC METABOLIC PANEL
Anion gap: 6 (ref 5–15)
BUN: 9 mg/dL (ref 8–23)
CO2: 23 mmol/L (ref 22–32)
Calcium: 7.5 mg/dL — ABNORMAL LOW (ref 8.9–10.3)
Chloride: 104 mmol/L (ref 98–111)
Creatinine, Ser: 0.78 mg/dL (ref 0.61–1.24)
GFR calc Af Amer: >60 mL/min (ref >60)
GFR calc non Af Amer: >60 mL/min (ref >60)
Glucose, Bld: 134 mg/dL — ABNORMAL HIGH (ref 70–99)
Potassium: 3.2 mmol/L — ABNORMAL LOW (ref 3.5–5.1)
Sodium: 133 mmol/L — ABNORMAL LOW (ref 135–145)

## 2018-12-29 LAB — PROTIME-INR
INR: 1.2 (ref 0.8–1.2)
Prothrombin Time: 15.5 seconds — ABNORMAL HIGH (ref 11.4–15.2)

## 2018-12-29 LAB — IRON AND TIBC
Iron: 16 ug/dL — ABNORMAL LOW (ref 45–182)
Saturation Ratios: 10 % — ABNORMAL LOW (ref 17.9–39.5)
TIBC: 168 ug/dL — ABNORMAL LOW (ref 250–450)
UIBC: 152 ug/dL

## 2018-12-29 LAB — CBC
HCT: 23.8 % — ABNORMAL LOW (ref 39.0–52.0)
Hemoglobin: 7.8 g/dL — ABNORMAL LOW (ref 13.0–17.0)
MCH: 28.3 pg (ref 26.0–34.0)
MCHC: 32.8 g/dL (ref 30.0–36.0)
MCV: 86.2 fL (ref 80.0–100.0)
Platelets: 86 10*3/uL — ABNORMAL LOW (ref 150–400)
RBC: 2.76 MIL/uL — ABNORMAL LOW (ref 4.22–5.81)
RDW: 14.7 % (ref 11.5–15.5)
WBC: 3.5 10*3/uL — ABNORMAL LOW (ref 4.0–10.5)
nRBC: 0 % (ref 0.0–0.2)

## 2018-12-29 LAB — VITAMIN B12: Vitamin B-12: 1219 pg/mL — ABNORMAL HIGH (ref 180–914)

## 2018-12-29 LAB — PROCALCITONIN: Procalcitonin: 33.26 ng/mL

## 2018-12-29 LAB — CULTURE, BLOOD (ROUTINE X 2)
Culture: NO GROWTH
Culture: NO GROWTH
Special Requests: ADEQUATE

## 2018-12-29 LAB — APTT: aPTT: 33 seconds (ref 24–36)

## 2018-12-29 LAB — ECHOCARDIOGRAM LIMITED
Height: 77 in
Weight: 3125.24 oz

## 2018-12-29 LAB — LACTIC ACID, PLASMA
Lactic Acid, Venous: 0.6 mmol/L (ref 0.5–1.9)
Lactic Acid, Venous: 0.6 mmol/L (ref 0.5–1.9)

## 2018-12-29 LAB — MAGNESIUM: Magnesium: 1.5 mg/dL — ABNORMAL LOW (ref 1.7–2.4)

## 2018-12-29 LAB — PHOSPHORUS: Phosphorus: 2.8 mg/dL (ref 2.5–4.6)

## 2018-12-29 LAB — FERRITIN: Ferritin: 353 ng/mL — ABNORMAL HIGH (ref 24–336)

## 2018-12-29 MED ORDER — PHENOL 1.4 % MT LIQD
1.0000 | OROMUCOSAL | Status: DC | PRN
  Filled 2018-12-29: qty 177

## 2018-12-29 MED ORDER — GUAIFENESIN-DM 100-10 MG/5ML PO SYRP: 5.0000 mL | ORAL_SOLUTION | ORAL | Status: DC | PRN

## 2018-12-29 MED ORDER — MUSCLE RUB 10-15 % EX CREA
1.0000 "application " | TOPICAL_CREAM | CUTANEOUS | Status: DC | PRN
  Filled 2018-12-29: qty 85

## 2018-12-29 MED ORDER — MAGNESIUM SULFATE 2 GM/50ML IV SOLN
2.0000 g | Freq: Once | INTRAVENOUS | Status: AC
  Administered 2018-12-29: 2 g via INTRAVENOUS
  Filled 2018-12-29: qty 50

## 2018-12-29 MED ORDER — SALINE SPRAY 0.65 % NA SOLN
1.0000 | NASAL | Status: DC | PRN
  Filled 2018-12-29: qty 44

## 2018-12-29 MED ORDER — POTASSIUM CHLORIDE CRYS ER 20 MEQ PO TBCR
40.0000 meq | EXTENDED_RELEASE_TABLET | Freq: Once | ORAL | Status: AC
  Administered 2018-12-29: 40 meq via ORAL
  Filled 2018-12-29: qty 2

## 2018-12-29 MED ORDER — IPRATROPIUM BROMIDE 0.02 % IN SOLN: 0.5000 mg | Freq: Four times a day (QID) | RESPIRATORY_TRACT | Status: DC | PRN

## 2018-12-29 MED ORDER — POLYVINYL ALCOHOL 1.4 % OP SOLN
1.0000 [drp] | OPHTHALMIC | Status: DC | PRN
  Filled 2018-12-29: qty 15

## 2018-12-29 MED ORDER — BISACODYL 5 MG PO TBEC: 5.0000 mg | DELAYED_RELEASE_TABLET | Freq: Every day | ORAL | Status: DC | PRN

## 2018-12-29 MED ORDER — ALUM & MAG HYDROXIDE-SIMETH 200-200-20 MG/5ML PO SUSP
30.0000 mL | ORAL | Status: DC | PRN
  Administered 2018-12-29: 30 mL via ORAL
  Filled 2018-12-29: qty 30

## 2018-12-29 MED ORDER — HYDROCORTISONE (PERIANAL) 2.5 % EX CREA
1.0000 "application " | TOPICAL_CREAM | Freq: Four times a day (QID) | CUTANEOUS | Status: DC | PRN
  Filled 2018-12-29: qty 28.35

## 2018-12-29 MED ORDER — LORATADINE 10 MG PO TABS: 10.0000 mg | ORAL_TABLET | Freq: Every day | ORAL | Status: DC | PRN

## 2018-12-29 MED ORDER — FERROUS SULFATE 325 (65 FE) MG PO TABS
325.0000 mg | ORAL_TABLET | Freq: Two times a day (BID) | ORAL | Status: DC
  Administered 2018-12-29 – 2019-01-04 (×12): 325 mg via ORAL
  Filled 2018-12-29 (×14): qty 1

## 2018-12-29 MED ORDER — SENNOSIDES-DOCUSATE SODIUM 8.6-50 MG PO TABS: 2.0000 | ORAL_TABLET | Freq: Every evening | ORAL | Status: DC | PRN

## 2018-12-29 MED ORDER — HYDRALAZINE HCL 20 MG/ML IJ SOLN: 10.0000 mg | INTRAMUSCULAR | Status: DC | PRN

## 2018-12-29 MED ORDER — HYDROCORTISONE 1 % EX CREA
1.0000 "application " | TOPICAL_CREAM | Freq: Three times a day (TID) | CUTANEOUS | Status: DC | PRN
  Filled 2018-12-29: qty 28

## 2018-12-29 MED ORDER — POLYETHYLENE GLYCOL 3350 17 G PO PACK: 17.0000 g | PACK | Freq: Every day | ORAL | Status: DC | PRN

## 2018-12-29 MED ORDER — LIP MEDEX EX OINT: 1.0000 "application " | TOPICAL_OINTMENT | CUTANEOUS | Status: DC | PRN

## 2018-12-29 NOTE — Progress Notes (Signed)
Paged Md Reesa Chew about the Pt's two beat run of Trigeminy. The Pt is alert x4 no c/o discomfort. Report given to [place] who will resume care.

## 2018-12-29 NOTE — Progress Notes (Signed)
  Echocardiogram 2D Echocardiogram has been performed.  Nisreen Guise L Androw 12/29/2018, 9:27 AM

## 2018-12-29 NOTE — Progress Notes (Signed)
PROGRESS NOTE    Cory Kelly  ZTI:[id number] DOB: 02-09-1956 DOA: 12/28/2018 PCP: Mancel Bale, PA-C   Brief Narrative:  63 year old with history of metastatic pancreatic cancer status post biliary stent, essential hypertension, hyperlipidemia returned back to the hospital with fevers and chills.  He was recently discharged here from the hospital after being treated for gram-negative and Streptococcus bacteremia with IV Rocephin.   Assessment & Plan:   Principal Problem:   Sepsis ([place]) Active Problems:   Essential hypertension   Hyperlipemia   Pancreatic cancer ([facility])   Mass of pancreas   Fever, unspecified   Bacteremia due to Gram-negative bacteria  Sepsis, present on admission Streptococcus bacteremia and gram-negative rod bacteremia, unclear source -Continue IV vancomycin cefepime and Flagyl.  IV fluids. -Repeat cultures done-follow-up data - Previously his port was removed and PICC line was placed.  Previous culture data reviewed. -I also discussed case extensively with gastroenterology, Dr. Lyndel Safe, Dr. Johnnye Sima from infectious disease and also Dr. Learta Codding. -We will obtain limited echocardiogram-if negative, will need TEE to rule out endocarditis.  Normocytic anemia, iron deficiency -Also from chronic disease.  Will start iron supplements with bowel regimen.  Hypokalemia magnesium -Repletion ordered.  History of pancreatic cancer status post metastases -Per oncology team.  Large volume stool, constipation -Bowel regimen  Nonobstructive bilateral kidney stones -IV fluids,nowon Flomax.  Peripheral neuropathy -On home gabapentin.  Essential hypertension -Resume home meds.  DVT prophylaxis: Lovenox Code Status: Full code Family Communication: Spoke with the patient's wife over the phone Disposition Plan: Maintain hospital stay for IV antibiotics.  Expect him to be in the hospital for at least 3 days.  Consultants:   Curbside-gastroenterology,  infectious disease.  Also spoke with Dr. Benay Spice yesterday  Procedures:   None  Antimicrobials:   Vanc D2  Cefepime D2  Flagyl D2   Subjective: Feels better today, febrile overnight. No complaints.   Review of Systems Otherwise negative except as per HPI, including: General: Deniesnight sweats or unintended weight loss. Resp: Denies cough, wheezing, shortness of breath. Cardiac: Denies chest pain, palpitations, orthopnea, paroxysmal nocturnal dyspnea. GI: Denies abdominal pain, nausea, vomiting, diarrhea or constipation GU: Denies dysuria, frequency, hesitancy or incontinence MS: Denies muscle aches, joint pain or swelling Neuro: Denies headache, neurologic deficits (focal weakness, numbness, tingling), abnormal gait Psych: Denies anxiety, depression, SI/HI/AVH Skin: Denies new rashes or lesions ID: Denies sick contacts, exotic exposures, travel  Objective: Vitals:   12/28/18 2200 12/28/18 2223 12/29/18 0637 12/29/18 1025  BP: 109/71 121/78 109/77 121/76  Pulse: (!) 58 60 (!) 59 60  Resp: (!) 21  20 16   Temp:  97.7 F (36.5 C) 97.6 F (36.4 C) 97.8 F (36.6 C)  TempSrc:  Oral  Oral  SpO2: 97% 98% 98%   Weight:      Height:        Intake/Output Summary (Last 24 hours) at 12/29/2018 1127 Last data filed at 12/29/2018 0600 Gross per 24 hour  Intake 1618.73 ml  Output --  Net 1618.73 ml   Filed Weights   12/28/18 1703  Weight: 88.6 kg    Examination:  General exam: Appears calm and comfortable  Respiratory system: Clear to auscultation. Respiratory effort normal. Cardiovascular system: S1 & S2 heard, RRR. No JVD, murmurs, rubs, gallops or clicks. No pedal edema. Gastrointestinal system: Abdomen is nondistended, soft and nontender. No organomegaly or masses felt. Normal bowel sounds heard. Central nervous system: Alert and oriented. No focal neurological deficits. Extremities: Symmetric 5 x 5 power. Skin: No  rashes, lesions or ulcers Psychiatry: Judgement  and insight appear normal. Mood & affect appropriate.  PICC RUE since 6/25   Data Reviewed:   CBC: Recent Labs  Lab 12/24/18 1123 12/25/18 0725 12/26/18 0555 12/27/18 0652 12/28/18 1750 12/29/18 0250  WBC 9.3 2.3* 2.6* 2.2* 5.7 3.5*  NEUTROABS 8.2* 1.6*  --  1.7 5.3  --   HGB 12.0* 9.2* 9.3* 9.2* 9.3* 7.8*  HCT 37.0* 28.4* 28.9* 27.8* 27.7* 23.8*  MCV 87.5 87.9 87.6 86.1 85.0 86.2  PLT 169 60* 78* 90* 97* 86*   Basic Metabolic Panel: Recent Labs  Lab 12/25/18 0725 12/26/18 0555 12/27/18 0652 12/28/18 1750 12/28/18 2334 12/29/18 0250  NA 135 133* 135 132*  --  133*  K 3.6 3.2* 3.4* 3.1*  --  3.2*  CL 103 102 105 99  --  104  CO2 23 26 23 24   --  23  GLUCOSE 104* 112* 114* 120*  --  134*  BUN 7* 6* 5* 9  --  9  CREATININE 0.71 0.72 0.68 0.88  --  0.78  CALCIUM 7.9* 7.8* 7.7* 7.8*  --  7.5*  MG  --  1.6* 2.0  --  1.5*  --   PHOS  --   --   --   --  2.8  --    GFR: Estimated Creatinine Clearance: 121.5 mL/min (by C-G formula based on SCr of 0.78 mg/dL). Liver Function Tests: Recent Labs  Lab 12/24/18 1205 12/27/18 0652 12/28/18 1750  AST 40 28 35  ALT 73* 56* 51*  ALKPHOS 754* 531* 525*  BILITOT 0.8 0.4 0.7  PROT 6.3* 4.9* 5.0*  ALBUMIN 3.0* 2.5* 2.4*   Recent Labs  Lab 12/24/18 1328  LIPASE 24   No results for input(s): AMMONIA in the last 168 hours. Coagulation Profile: Recent Labs  Lab 12/28/18 1750 12/28/18 2334  INR 1.2 1.2   Cardiac Enzymes: No results for input(s): CKTOTAL, CKMB, CKMBINDEX, TROPONINI in the last 168 hours. BNP (last 3 results) No results for input(s): PROBNP in the last 8760 hours. HbA1C: No results for input(s): HGBA1C in the last 72 hours. CBG: No results for input(s): GLUCAP in the last 168 hours. Lipid Profile: No results for input(s): CHOL, HDL, LDLCALC, TRIG, CHOLHDL, LDLDIRECT in the last 72 hours. Thyroid Function Tests: No results for input(s): TSH, T4TOTAL, FREET4, T3FREE, THYROIDAB in the last 72  hours. Anemia Panel: Recent Labs    12/28/18 2334  VITAMINB12 1,219*  FERRITIN 353*  TIBC 168*  IRON 16*   Sepsis Labs: Recent Labs  Lab 12/24/18 1328 12/26/18 0555 12/27/18 0652 12/28/18 1708 12/28/18 2334 12/29/18 0250  PROCALCITON  --  42.75 23.49  --  33.26  --   LATICACIDVEN 1.7  --   --  1.0 0.6 0.6    Recent Results (from the past 240 hour(s))  Culture, Blood     Status: Abnormal   Collection Time: 12/24/18 11:25 AM   Specimen: BLOOD RIGHT ARM  Result Value Ref Range Status   Specimen Description   Final    BLOOD RIGHT ARM Performed at [facility], [place] [address] [place], [place] [address]    Special Requests   Final    BOTTLES DRAWN AEROBIC AND ANAEROBIC Blood Culture adequate volume   Culture  Setup Time   Final    AEROBIC BOTTLE ONLY GRAM POSITIVE COCCI CRITICAL RESULT CALLED TO, READ BACK BY AND VERIFIED WITH: J LEGGE PHARMD 12/25/18 2030 JDW GRAM  VARIABLE ROD ANAEROBIC BOTTLE ONLY CRITICAL RESULT CALLED TO, READ BACK BY AND VERIFIED WITH: Damian Leavell [id number] Performed at [facility], [place] [address] [place], [place] [address]    Culture STREPTOCOCCUS ANGINOSIS (A)  Final   Report Status 12/28/2018 FINAL  Final   Organism ID, Bacteria STREPTOCOCCUS ANGINOSIS  Final      Susceptibility   Streptococcus anginosis - MIC*    PENICILLIN 0.12 SENSITIVE Sensitive     CEFTRIAXONE 0.5 SENSITIVE Sensitive     ERYTHROMYCIN <=0.12 SENSITIVE Sensitive     LEVOFLOXACIN <=0.25 SENSITIVE Sensitive     VANCOMYCIN 0.5 SENSITIVE Sensitive     * STREPTOCOCCUS ANGINOSIS  Blood Culture ID Panel (Reflexed)     Status: Abnormal   Collection Time: 12/24/18 11:25 AM  Result Value Ref Range Status   Enterococcus species NOT DETECTED NOT DETECTED Final   Listeria monocytogenes NOT DETECTED NOT DETECTED Final   Staphylococcus species NOT DETECTED NOT DETECTED Final   Staphylococcus aureus (BCID) NOT DETECTED NOT DETECTED Final    Streptococcus species DETECTED (A) NOT DETECTED Final    Comment: Not Enterococcus species, Streptococcus agalactiae, Streptococcus pyogenes, or Streptococcus pneumoniae. CRITICAL RESULT CALLED TO, READ BACK BY AND VERIFIED WITH: J LEGGE PHARMD 12/25/18 2030 JDW    Streptococcus agalactiae NOT DETECTED NOT DETECTED Final   Streptococcus pneumoniae NOT DETECTED NOT DETECTED Final   Streptococcus pyogenes NOT DETECTED NOT DETECTED Final   Acinetobacter baumannii NOT DETECTED NOT DETECTED Final   Enterobacteriaceae species NOT DETECTED NOT DETECTED Final   Enterobacter cloacae complex NOT DETECTED NOT DETECTED Final   Escherichia coli NOT DETECTED NOT DETECTED Final   Klebsiella oxytoca NOT DETECTED NOT DETECTED Final   Klebsiella pneumoniae NOT DETECTED NOT DETECTED Final   Proteus species NOT DETECTED NOT DETECTED Final   Serratia marcescens NOT DETECTED NOT DETECTED Final   Haemophilus influenzae NOT DETECTED NOT DETECTED Final   Neisseria meningitidis NOT DETECTED NOT DETECTED Final   Pseudomonas aeruginosa NOT DETECTED NOT DETECTED Final   Candida albicans NOT DETECTED NOT DETECTED Final   Candida glabrata NOT DETECTED NOT DETECTED Final   Candida krusei NOT DETECTED NOT DETECTED Final   Candida parapsilosis NOT DETECTED NOT DETECTED Final   Candida tropicalis NOT DETECTED NOT DETECTED Final    Comment: Performed at [facility], [place] [address] [place], [place] [address]  Culture, Blood     Status: None (Preliminary result)   Collection Time: 12/24/18 12:09 PM   Specimen: Porta Cath; Blood  Result Value Ref Range Status   Specimen Description   Final    PORTA CATH Performed at [facility], [place] [address] [place], [place] [address]    Special Requests   Final    NONE Performed at [facility], [place] [address], [place], [place] [address]    Culture  Setup Time   Final    GRAM NEGATIVE RODS ANAEROBIC BOTTLE ONLY CRITICAL  RESULT CALLED TO, READ BACK BY AND VERIFIED WITH: Sheffield Slider PharmD 12:00 12/27/18 ([email or web address]) Performed at [facility], [place] [address], [place],  [address]    Culture GRAM NEGATIVE RODS  Final   Report Status PENDING  Incomplete  Blood Culture ID Panel (Reflexed)     Status: None   Collection Time: 12/24/18 12:09 PM  Result Value Ref Range Status   Enterococcus species NOT DETECTED NOT DETECTED Final   Listeria monocytogenes NOT DETECTED NOT DETECTED Final  Staphylococcus species NOT DETECTED NOT DETECTED Final   Staphylococcus aureus (BCID) NOT DETECTED NOT DETECTED Final   Streptococcus species NOT DETECTED NOT DETECTED Final   Streptococcus agalactiae NOT DETECTED NOT DETECTED Final   Streptococcus pneumoniae NOT DETECTED NOT DETECTED Final   Streptococcus pyogenes NOT DETECTED NOT DETECTED Final   Acinetobacter baumannii NOT DETECTED NOT DETECTED Final   Enterobacteriaceae species NOT DETECTED NOT DETECTED Final   Enterobacter cloacae complex NOT DETECTED NOT DETECTED Final   Escherichia coli NOT DETECTED NOT DETECTED Final   Klebsiella oxytoca NOT DETECTED NOT DETECTED Final   Klebsiella pneumoniae NOT DETECTED NOT DETECTED Final   Proteus species NOT DETECTED NOT DETECTED Final   Serratia marcescens NOT DETECTED NOT DETECTED Final   Haemophilus influenzae NOT DETECTED NOT DETECTED Final   Neisseria meningitidis NOT DETECTED NOT DETECTED Final   Pseudomonas aeruginosa NOT DETECTED NOT DETECTED Final   Candida albicans NOT DETECTED NOT DETECTED Final   Candida glabrata NOT DETECTED NOT DETECTED Final   Candida krusei NOT DETECTED NOT DETECTED Final   Candida parapsilosis NOT DETECTED NOT DETECTED Final   Candida tropicalis NOT DETECTED NOT DETECTED Final    Comment: Performed at [facility], [place] [address] [place], [place] [address]  Blood Culture ID Panel (Reflexed)     Status: None   Collection Time: 12/24/18 12:27 PM  Result Value Ref Range Status    Enterococcus species NOT DETECTED NOT DETECTED Final   Listeria monocytogenes NOT DETECTED NOT DETECTED Final   Staphylococcus species NOT DETECTED NOT DETECTED Final   Staphylococcus aureus (BCID) NOT DETECTED NOT DETECTED Final   Streptococcus species NOT DETECTED NOT DETECTED Final   Streptococcus agalactiae NOT DETECTED NOT DETECTED Final   Streptococcus pneumoniae NOT DETECTED NOT DETECTED Final   Streptococcus pyogenes NOT DETECTED NOT DETECTED Final   Acinetobacter baumannii NOT DETECTED NOT DETECTED Final   Enterobacteriaceae species NOT DETECTED NOT DETECTED Final   Enterobacter cloacae complex NOT DETECTED NOT DETECTED Final   Escherichia coli NOT DETECTED NOT DETECTED Final   Klebsiella oxytoca NOT DETECTED NOT DETECTED Final   Klebsiella pneumoniae NOT DETECTED NOT DETECTED Final   Proteus species NOT DETECTED NOT DETECTED Final   Serratia marcescens NOT DETECTED NOT DETECTED Final   Haemophilus influenzae NOT DETECTED NOT DETECTED Final   Neisseria meningitidis NOT DETECTED NOT DETECTED Final   Pseudomonas aeruginosa NOT DETECTED NOT DETECTED Final   Candida albicans NOT DETECTED NOT DETECTED Final   Candida glabrata NOT DETECTED NOT DETECTED Final   Candida krusei NOT DETECTED NOT DETECTED Final   Candida parapsilosis NOT DETECTED NOT DETECTED Final   Candida tropicalis NOT DETECTED NOT DETECTED Final    Comment: Performed at [facility], [place] [address] [place], [place] [address]  Urine culture     Status: None   Collection Time: 12/24/18  1:28 PM   Specimen: Urine, Clean Catch  Result Value Ref Range Status   Specimen Description   Final    URINE, CLEAN CATCH Performed at [facility], [place] [address], [place], [place] [address]    Special Requests   Final    NONE Performed at [facility], [place] [address], [place], [place] [address]    Culture   Final    NO GROWTH Performed at [facility], [place] [address]  [place],  [address]    Report Status 12/25/2018 FINAL  Final  SARS Coronavirus 2 (CEPHEID - Performed in [facility] hospital lab), Hosp Order     Status: None   Collection Time: 12/24/18  1:28 PM   Specimen: Nasopharyngeal Swab  Result Value Ref Range Status   SARS Coronavirus 2 NEGATIVE NEGATIVE Final    Comment: (NOTE) If result is NEGATIVE SARS-CoV-2 target nucleic acids are NOT DETECTED. The SARS-CoV-2 RNA is generally detectable in upper and lower  respiratory specimens during the acute phase of infection. The lowest  concentration of SARS-CoV-2 viral copies this assay can detect is 250  copies / mL. A negative result does not preclude SARS-CoV-2 infection  and should not be used as the sole basis for treatment or other  patient management decisions.  A negative result may occur with  improper specimen collection / handling, submission of specimen other  than nasopharyngeal swab, presence of viral mutation(s) within the  areas targeted by this assay, and inadequate number of viral copies  (<250 copies / mL). A negative result must be combined with clinical  observations, patient history, and epidemiological information. If result is POSITIVE SARS-CoV-2 target nucleic acids are DETECTED. The SARS-CoV-2 RNA is generally detectable in upper and lower  respiratory specimens dur ing the acute phase of infection.  Positive  results are indicative of active infection with SARS-CoV-2.  Clinical  correlation with patient history and other diagnostic information is  necessary to determine patient infection status.  Positive results do  not rule out bacterial infection or co-infection with other viruses. If result is PRESUMPTIVE POSTIVE SARS-CoV-2 nucleic acids MAY BE PRESENT.   A presumptive positive result was obtained on the submitted specimen  and confirmed on repeat testing.  While 2019 novel coronavirus  (SARS-CoV-2) nucleic acids may be present in the submitted sample    additional confirmatory testing may be necessary for epidemiological  and / or clinical management purposes  to differentiate between  SARS-CoV-2 and other Sarbecovirus currently known to infect humans.  If clinically indicated additional testing with an alternate test  methodology [phone]) is advised. The SARS-CoV-2 RNA is generally  detectable in upper and lower respiratory sp ecimens during the acute  phase of infection. The expected result is Negative. Fact Sheet for Patients:  StrictlyIdeas.no Fact Sheet for Healthcare Providers: BankingDealers.co.za This test is not yet approved or cleared by the [place] FDA and has been authorized for detection and/or diagnosis of SARS-CoV-2 by FDA under an Emergency Use Authorization (EUA).  This EUA will remain in effect (meaning this test can be used) for the duration of the COVID-19 declaration under Section 564(b)(1) of the Act, 21 U.S.C. section 360bbb-3(b)(1), unless the authorization is terminated or revoked sooner. Performed at [facility], [place] [address], [place], [place] [address]   Culture, blood (routine x 2)     Status: None   Collection Time: 12/24/18  5:22 PM   Specimen: BLOOD  Result Value Ref Range Status   Specimen Description   Final    BLOOD LEFT ANTECUBITAL Performed at [facility], [place] [address] [place], [place] [address]    Special Requests   Final    BOTTLES DRAWN AEROBIC AND ANAEROBIC Blood Culture results may not be optimal due to an excessive volume of blood received in culture bottles Performed at [place] [address] [place] [address], [place],  [address]    Culture   Final    NO GROWTH 5 DAYS Performed at [facility], [place] [address], [place],  [address]    Report Status 12/29/2018  FINAL  Final  Culture, blood (routine x 2)     Status: None   Collection Time: 12/24/18  5:29 PM   Specimen: BLOOD   Result Value Ref Range Status   Specimen Description   Final    BLOOD RIGHT ANTECUBITAL Performed at [facility], [place] [address] [place], [place] [address]    Special Requests   Final    BOTTLES DRAWN AEROBIC AND ANAEROBIC Blood Culture adequate volume Performed at [place] [address] [place], [place] [address]    Culture   Final    NO GROWTH 5 DAYS Performed at [facility], [place] [address] [place], [place] [address]    Report Status 12/29/2018 FINAL  Final  Culture, blood (Routine x 2)     Status: None (Preliminary result)   Collection Time: 12/28/18  5:50 PM   Specimen: BLOOD  Result Value Ref Range Status   Specimen Description   Final    BLOOD BLOOD LEFT FOREARM Performed at [place] [address], [place], [place] [address]    Special Requests   Final    BOTTLES DRAWN AEROBIC AND ANAEROBIC Blood Culture adequate volume Performed at [place] [address] [place], [place] [address]    Culture   Final    NO GROWTH < 12 HOURS Performed at [place] [address], [place], [place] [address]    Report Status PENDING  Incomplete  Culture, blood (Routine x 2)     Status: None (Preliminary result)   Collection Time: 12/28/18  5:50 PM   Specimen: BLOOD  Result Value Ref Range Status   Specimen Description   Final    BLOOD RIGHT MIDLINE Performed at [place] [address] [place], [place] [address]    Special Requests   Final    BOTTLES DRAWN AEROBIC AND ANAEROBIC Blood Culture adequate volume Performed at [place] [address], [place], [place] [address]    Culture   Final    NO GROWTH < 12 HOURS Performed at [place] [address] [place], [place] [address]    Report Status PENDING  Incomplete  SARS Coronavirus 2 (CEPHEID - Performed in [place] hospital lab), Hosp Order     Status: None    Collection Time: 12/28/18  5:52 PM   Specimen: Nasopharyngeal Swab  Result Value Ref Range Status   SARS Coronavirus 2 NEGATIVE NEGATIVE Final    Comment: (NOTE) If result is NEGATIVE SARS-CoV-2 target nucleic acids are NOT DETECTED. The SARS-CoV-2 RNA is generally detectable in upper and lower  respiratory specimens during the acute phase of infection. The lowest  concentration of SARS-CoV-2 viral copies this assay can detect is 250  copies / mL. A negative result does not preclude SARS-CoV-2 infection  and should not be used as the sole basis for treatment or other  patient management decisions.  A negative result may occur with  improper specimen collection / handling, submission of specimen other  than nasopharyngeal swab, presence of viral mutation(s) within the  areas targeted by this assay, and inadequate number of viral copies  (<250 copies / mL). A negative result must be combined with clinical  observations, patient history, and epidemiological information. If result is POSITIVE SARS-CoV-2 target nucleic acids are DETECTED. The SARS-CoV-2 RNA is generally detectable in upper and lower  respiratory specimens dur ing the acute phase of infection.  Positive  results are indicative of active infection with SARS-CoV-2.  Clinical  correlation with patient history and other diagnostic information is  necessary to determine patient infection status.  Positive results do  not rule out bacterial infection or co-infection with other viruses. If result is PRESUMPTIVE POSTIVE SARS-CoV-2 nucleic acids MAY BE PRESENT.   A presumptive positive result was obtained on the submitted specimen  and confirmed on repeat testing.  While 2019 novel coronavirus  (SARS-CoV-2) nucleic acids may be present in the submitted sample  additional confirmatory testing may be necessary for epidemiological  and / or clinical management purposes  to differentiate between  SARS-CoV-2 and other Sarbecovirus  currently known to infect humans.  If clinically indicated additional testing with an alternate test  methodology [phone]) is advised. The SARS-CoV-2 RNA is generally  detectable in upper and lower respiratory sp ecimens during the acute  phase of infection. The expected result is Negative. Fact Sheet for Patients:  StrictlyIdeas.no Fact Sheet for Healthcare Providers: BankingDealers.co.za This test is not yet approved or cleared by the [place] FDA and has been authorized for detection and/or diagnosis of SARS-CoV-2 by FDA under an Emergency Use Authorization (EUA).  This EUA will remain in effect (meaning this test can be used) for the duration of the COVID-19 declaration under Section 564(b)(1) of the Act, 21 U.S.C. section 360bbb-3(b)(1), unless the authorization is terminated or revoked sooner. Performed at [facility], [place] [address] [place], [place] [address]          Radiology Studies: Dg Chest 2 View  Result Date: 12/28/2018 CLINICAL DATA:  Sepsis EXAM: CHEST - 2 VIEW COMPARISON:  December 24, 2018 FINDINGS: The right-sided Port-A-Cath has been removed. There is a new right-sided PICC line with tip terminating over the distal SVC. The cardiac size is stable. There is no pneumothorax. No large pleural effusion. The lung volumes are slightly low. IMPRESSION: No active cardiopulmonary disease. Electronically Signed   By: Constance Holster M.D.   On: 12/28/2018 18:53   Ir Removal [employer] W/ [place] W/o Fl Mod Sed  Result Date: 12/27/2018 CLINICAL DATA:  Bacteremia. Indwelling right IJ port catheter placed 08/30/2017 by Dr. Pascal Lux, has been working well without evident complication , removal requested EXAM: EXAM TUNNELED PORT CATHETER REMOVAL TECHNIQUE: The procedure, risks (including but not limited to bleeding, infection, organ damage ), benefits, and alternatives were explained to the patient. Questions  regarding the procedure were encouraged and answered. The patient understands and consents to the procedure. Intravenous Fentanyl 169mcg and Versed 4mg  were administered as conscious sedation during continuous monitoring of the patient's level of consciousness and physiological / cardiorespiratory status by the radiology RN, with a total moderate sedation time of 37 minutes. Overlying skin prepped with chlorhexidine, draped in usual sterile fashion, infiltrated locally with 1% lidocaine. A small incision was made over the scar from previous placement. The port catheter was dissected free from the underlying soft tissues and removed intact. There is no gross purulence. Hemostasis was achieved. The port pocket was closed with deep interrupted and subcuticular continuous 3-0 Monocryl sutures, then covered with Dermabond. The patient tolerated the procedure well. COMPLICATIONS: COMPLICATIONS None immediate IMPRESSION: 1.  Technically successful tunneled Port catheter removal. Electronically Signed   By: Lucrezia Europe M.D.   On: 12/27/2018 16:14   Dg Chest Port 1 View  Result Date: 12/28/2018 CLINICAL DATA:  Sepsis hypertension EXAM: PORTABLE CHEST 1 VIEW COMPARISON:  12/28/2018, 12/24/2018 FINDINGS: Right upper extremity catheter tip  overlies the SVC. No acute consolidation or effusion. Normal cardiomediastinal silhouette. No pneumothorax. IMPRESSION: No active disease. Electronically Signed   By: Donavan Foil M.D.   On: 12/28/2018 23:03   Ir Picc Placement Right >5 Yrs Inc Img Guide  Result Date: 12/27/2018 CLINICAL DATA:  Bacteremia, needs venous access for planned chemotherapy Monday EXAM: PICC PLACEMENT WITH ULTRASOUND AND FLUOROSCOPY FLUOROSCOPY TIME:  6 seconds, 1 mGy TECHNIQUE: After written informed consent was obtained, patient was placed in the supine position on angiographic table. Patency of the right basilic vein was confirmed with ultrasound with image documentation. An appropriate skin site was  determined. Skin site was marked. Region was prepped using maximum barrier technique including cap and mask, sterile gown, sterile gloves, large sterile sheet, and Chlorhexidine as cutaneous antisepsis. The region was infiltrated locally with 1% lidocaine. Under real-time ultrasound guidance, the right basilic vein was accessed with a 21 gauge micropuncture needle; the needle tip within the vein was confirmed with ultrasound image documentation. Needle exchanged over a 018 guidewire for a peel-away sheath, through which a 5-French single-lumen power injectable PICC trimmed to 46cm was advanced, positioned with its tip near the cavoatrial junction. Spot chest radiograph confirms appropriate catheter position. Catheter was flushed per protocol and secured externally. The patient tolerated procedure well. COMPLICATIONS: COMPLICATIONS none IMPRESSION: 1. Technically successful five [place] single lumen power injectable PICC placement Electronically Signed   By: Lucrezia Europe M.D.   On: 12/27/2018 16:15        Scheduled Meds:  amLODipine  10 mg Oral QHS   calamine   Topical BID   doxazosin  1 mg Oral QHS   enoxaparin (LOVENOX) injection  40 mg Subcutaneous Daily   ferrous sulfate  325 mg Oral BID WC   gabapentin  200 mg Oral BID   irbesartan  300 mg Oral Daily   potassium chloride SA  20 mEq Oral BID   tamsulosin  0.4 mg Oral QPC supper   Continuous Infusions:  sodium chloride 75 mL/hr at 12/29/18 0011   ceFEPime (MAXIPIME) IV 2 g (12/29/18 1034)   magnesium sulfate bolus IVPB     metronidazole 500 mg (12/29/18 0531)   vancomycin 1,500 mg (12/29/18 0100)     LOS: 1 day   Time spent= 35 mins    Terra Aveni Arsenio Loader, MD [facility]  If 7PM-7AM, please contact night-coverage [email or web address] 12/29/2018, 11:27 AM

## 2018-12-29 NOTE — Progress Notes (Signed)
Patient had a soft stool, not watery.  C diff order discontinued as per Dr. Reesa Chew.

## 2018-12-30 LAB — CBC
HCT: 25.3 % — ABNORMAL LOW (ref 39.0–52.0)
Hemoglobin: 8 g/dL — ABNORMAL LOW (ref 13.0–17.0)
MCH: 27.5 pg (ref 26.0–34.0)
MCHC: 31.6 g/dL (ref 30.0–36.0)
MCV: 86.9 fL (ref 80.0–100.0)
Platelets: 100 10*3/uL — ABNORMAL LOW (ref 150–400)
RBC: 2.91 MIL/uL — ABNORMAL LOW (ref 4.22–5.81)
RDW: 14.7 % (ref 11.5–15.5)
WBC: 2.3 10*3/uL — ABNORMAL LOW (ref 4.0–10.5)
nRBC: 0 % (ref 0.0–0.2)

## 2018-12-30 LAB — COMPREHENSIVE METABOLIC PANEL
ALT: 48 U/L — ABNORMAL HIGH (ref 0–44)
AST: 45 U/L — ABNORMAL HIGH (ref 15–41)
Albumin: 2.1 g/dL — ABNORMAL LOW (ref 3.5–5.0)
Alkaline Phosphatase: 477 U/L — ABNORMAL HIGH (ref 38–126)
Anion gap: 7 (ref 5–15)
BUN: 7 mg/dL — ABNORMAL LOW (ref 8–23)
CO2: 22 mmol/L (ref 22–32)
Calcium: 7.5 mg/dL — ABNORMAL LOW (ref 8.9–10.3)
Chloride: 106 mmol/L (ref 98–111)
Creatinine, Ser: 0.61 mg/dL (ref 0.61–1.24)
GFR calc Af Amer: >60 mL/min (ref >60)
GFR calc non Af Amer: >60 mL/min (ref >60)
Glucose, Bld: 124 mg/dL — ABNORMAL HIGH (ref 70–99)
Potassium: 3.3 mmol/L — ABNORMAL LOW (ref 3.5–5.1)
Sodium: 135 mmol/L (ref 135–145)
Total Bilirubin: 0.4 mg/dL (ref 0.3–1.2)
Total Protein: 4.5 g/dL — ABNORMAL LOW (ref 6.5–8.1)

## 2018-12-30 LAB — MAGNESIUM: Magnesium: 1.9 mg/dL (ref 1.7–2.4)

## 2018-12-30 MED ORDER — VANCOMYCIN HCL 10 G IV SOLR
1500.0000 mg | Freq: Two times a day (BID) | INTRAVENOUS | Status: DC
  Administered 2018-12-30 – 2018-12-31 (×3): 1500 mg via INTRAVENOUS
  Filled 2018-12-30 (×4): qty 1500

## 2018-12-30 MED ORDER — POTASSIUM CHLORIDE CRYS ER 20 MEQ PO TBCR
40.0000 meq | EXTENDED_RELEASE_TABLET | Freq: Once | ORAL | Status: AC
  Administered 2018-12-30: 40 meq via ORAL
  Filled 2018-12-30: qty 2

## 2018-12-30 NOTE — Evaluation (Signed)
Physical Therapy Evaluation Patient Details Name: Cory Kelly MRN: [id number] DOB: 29-Apr-1956 Today's Date: 12/30/2018   History of Present Illness  63 year old with history of metastatic pancreatic cancer status post biliary stent, essential hypertension, hyperlipidemia returned back to the hospital with fevers and chills  Clinical Impression  Patient evaluated by Physical Therapy with no further acute PT needs identified. All education has been completed and the patient has no further questions.  Pt doing well. amb in hallway 380'  Without LOB, no c/o pain, not significantly fatigued. Recommend pt continue to amb as tolerated 2-3x per day. Educated on basic bed exercises (ankle pumps and quad sets). No f/u needed  See below for any follow-up Physical Therapy or equipment needs. PT is signing off. Thank you for this referral.     Follow Up Recommendations No PT follow up    Equipment Recommendations  None recommended by PT    Recommendations for Other Services       Precautions / Restrictions Precautions Precautions: None      Mobility  Bed Mobility Overal bed mobility: Modified Independent                Transfers Overall transfer level: Modified independent               General transfer comment: incr time to rise from bed at lowest settin  Ambulation/Gait Ambulation/Gait assistance: Independent Gait Distance (Feet): 380 Feet Assistive device: None;IV Pole Gait Pattern/deviations: Step-through pattern;WFL(Within Functional Limits) Gait velocity: WFL   General Gait Details: WFL gait, normal speed with no LOB  Stairs            Wheelchair Mobility    Modified Rankin (Stroke Patients Only)       Balance Overall balance assessment: Independent                                           Pertinent Vitals/Pain Pain Assessment: No/denies pain(some abd distention and tightness)    Home Living Family/patient expects to be  discharged to:: Private residence Living Arrangements: Spouse/significant other Available Help at Discharge: Family Type of Home: House Home Access: Stairs to enter Entrance Stairs-Rails: Chemical engineer of Steps: 4 Home Layout: One level Home Equipment: None      Prior Function Level of Independence: Independent         Comments: Pt reports walking 2 miles/day with his dog, states he stays active at home.     Hand Dominance        Extremity/Trunk Assessment   Upper Extremity Assessment Upper Extremity Assessment: Overall WFL for tasks assessed    Lower Extremity Assessment Lower Extremity Assessment: Overall WFL for tasks assessed       Communication   Communication: No difficulties  Cognition Arousal/Alertness: Awake/alert Behavior During Therapy: WFL for tasks assessed/performed Overall Cognitive Status: Within Functional Limits for tasks assessed                                        General Comments      Exercises     Assessment/Plan    PT Assessment Patent does not need any further PT services  PT Problem List         PT Treatment Interventions      PT Goals (Current goals can  be found in the Care Plan section)  Acute Rehab PT Goals PT Goal Formulation: All assessment and education complete, DC therapy Time For Goal Achievement: 12/25/18 Potential to Achieve Goals: Good    Frequency     Barriers to discharge        Co-evaluation               AM-PAC PT "6 Clicks" Mobility  Outcome Measure Help needed turning from your back to your side while in a flat bed without using bedrails?: None Help needed moving from lying on your back to sitting on the side of a flat bed without using bedrails?: None Help needed moving to and from a bed to a chair (including a wheelchair)?: None Help needed standing up from a chair using your arms (e.g., wheelchair or bedside chair)?: None Help needed to walk in  hospital room?: None Help needed climbing 3-5 steps with a railing? : None 6 Click Score: 24    End of Session   Activity Tolerance: Patient tolerated treatment well Patient left: in bed;with call bell/phone within reach Nurse Communication: Mobility status PT Visit Diagnosis: Other abnormalities of gait and mobility (R26.89)    Time: 6789-3810 PT Time Calculation (min) (ACUTE ONLY): 20 min   Charges:   PT Evaluation $PT Eval Low Complexity: 1 Low          Kenyon Ana, PT  Pager: [phone] Acute Rehab Dept [facility]): [phone]   12/30/2018   [facility] 12/30/2018, 4:27 PM

## 2018-12-30 NOTE — Progress Notes (Signed)
PROGRESS NOTE    Cory Kelly  WNU:[id number] DOB: [birth year] DOA: 12/28/2018 PCP: Mancel Bale, PA-C   Brief Narrative:  63 year old with history of metastatic pancreatic cancer status post biliary stent, essential hypertension, hyperlipidemia returned back to the hospital with fevers and chills.  He was recently discharged here from the hospital after being treated for gram-negative and Streptococcus bacteremia with IV Rocephin.   Assessment & Plan:   Principal Problem:   Sepsis ([place]) Active Problems:   Essential hypertension   Hyperlipemia   Pancreatic cancer (HCC)   Mass of pancreas   Fever, unspecified   Bacteremia due to Gram-negative bacteria  Sepsis, present on admission Streptococcus bacteremia and gram-negative rod bacteremia, unclear source -Continue IV vancomycin cefepime and Flagyl.  IV fluids. -Repeat cultures done-follow-up data- NGTD - Previously his port was removed and PICC line was placed.  Previous culture data reviewed. -I also discussed case extensively with gastroenterology, Dr. Lyndel Safe, Dr. Johnnye Sima from infectious disease and also Dr. Learta Codding. -Echo- normal, neg for endocarditis, Ordered TEE. NPO PMN  Normocytic anemia, iron deficiency -Also from chronic disease.  Will start iron supplements with bowel regimen.  Hypokalemia magnesium -Repletion ordered.  History of pancreatic cancer status post metastases -Per oncology team.  Large volume stool, constipation -Bowel regimen  Nonobstructive bilateral kidney stones -IV fluids,nowon Flomax but would like to stop it as he isnt able to make it to the bathroom on time. Will stop at his request.   Peripheral neuropathy -On home gabapentin.  Essential hypertension -Resume home meds.  DVT prophylaxis: Lovenox Code Status: Full code Family Communication: Spoke with [name] today Disposition Plan: Maintain hospital stay for IV antibiotics.  Expect him to be in the hospital for at least 3 days.   Consultants:   Curbside-gastroenterology, infectious disease.  Also spoke with Dr. Benay Spice yesterday  Procedures:   None  Antimicrobials:   Vanc D3  Cefepime D3  Flagyl D3   Subjective: Feels ok, no complaints overall.   Review of Systems Otherwise negative except as per HPI, including: General = no fevers, chills, dizziness, malaise, fatigue HEENT/EYES = negative for pain, redness, loss of vision, double vision, blurred vision, loss of hearing, sore throat, hoarseness, dysphagia Cardiovascular= negative for chest pain, palpitation, murmurs, lower extremity swelling Respiratory/lungs= negative for shortness of breath, cough, hemoptysis, wheezing, mucus production Gastrointestinal= negative for nausea, vomiting,, abdominal pain, melena, hematemesis Genitourinary= negative for Dysuria, Hematuria, Change in Urinary Frequency MSK = Negative for arthralgia, myalgias, Back Pain, Joint swelling  Neurology= Negative for headache, seizures, numbness, tingling  Psychiatry= Negative for anxiety, depression, suicidal and homocidal ideation Allergy/Immunology= Medication/Food allergy as listed  Skin= Negative for Rash, lesions, ulcers, itching   Objective: Vitals:   12/29/18 0637 12/29/18 1025 12/29/18 1557 12/29/18 2124  BP: 109/77 121/76 129/84 127/82  Pulse: (!) 59 60 66 64  Resp: 20 16 16 18   Temp: 97.6 F (36.4 C) 97.8 F (36.6 C) 99 F (37.2 C) 98.7 F (37.1 C)  TempSrc:  Oral Oral Oral  SpO2: 98%  99% 97%  Weight:      Height:        Intake/Output Summary (Last 24 hours) at 12/30/2018 1139 Last data filed at 12/30/2018 0600 Gross per 24 hour  Intake 1600 ml  Output -  Net 1600 ml   Filed Weights   12/28/18 1703  Weight: 88.6 kg    Examination:  Constitutional: NAD, calm, comfortable Eyes: PERRL, lids and conjunctivae normal ENMT: Mucous membranes are moist. Posterior pharynx clear  of any exudate or lesions.Normal dentition.  Neck: normal, supple, no  masses, no thyromegaly Respiratory: clear to auscultation bilaterally, no wheezing, no crackles. Normal respiratory effort. No accessory muscle use.  Cardiovascular: Regular rate and rhythm, no murmurs / rubs / gallops. No extremity edema. 2+ pedal pulses. No carotid bruits.  Abdomen: no tenderness, no masses palpated. No hepatosplenomegaly. Bowel sounds positive.  Musculoskeletal: no clubbing / cyanosis. No joint deformity upper and lower extremities. Good ROM, no contractures. Normal muscle tone.  Skin: no rashes, lesions, ulcers. No induration Neurologic: CN 2-12 grossly intact. Sensation intact, DTR normal. Strength 5/5 in all 4.  Psychiatric: Normal judgment and insight. Alert and oriented x 3. Normal mood.   PICC RUE since 6/25   Data Reviewed:   CBC: Recent Labs  Lab 12/24/18 1123  12/25/18 0725 12/26/18 0555 12/27/18 0652 12/28/18 1750 12/29/18 0250 12/30/18 0429  WBC 9.3   < > 2.3* 2.6* 2.2* 5.7 3.5* 2.3*  NEUTROABS 8.2*  --  1.6*  --  1.7 5.3  --   --   HGB 12.0*   < > 9.2* 9.3* 9.2* 9.3* 7.8* 8.0*  HCT 37.0*  --  28.4* 28.9* 27.8* 27.7* 23.8* 25.3*  MCV 87.5  --  87.9 87.6 86.1 85.0 86.2 86.9  PLT 169   < > 60* 78* 90* 97* 86* 100*   < > = values in this interval not displayed.   Basic Metabolic Panel: Recent Labs  Lab 12/26/18 0555 12/27/18 0652 12/28/18 1750 12/28/18 2334 12/29/18 0250 12/30/18 0429  NA 133* 135 132*  --  133* 135  K 3.2* 3.4* 3.1*  --  3.2* 3.3*  CL 102 105 99  --  104 106  CO2 26 23 24   --  23 22  GLUCOSE 112* 114* 120*  --  134* 124*  BUN 6* 5* 9  --  9 7*  CREATININE 0.72 0.68 0.88  --  0.78 0.61  CALCIUM 7.8* 7.7* 7.8*  --  7.5* 7.5*  MG 1.6* 2.0  --  1.5*  --  1.9  PHOS  --   --   --  2.8  --   --    GFR: Estimated Creatinine Clearance: 121.5 mL/min (by C-G formula based on SCr of 0.61 mg/dL). Liver Function Tests: Recent Labs  Lab 12/24/18 1205 12/27/18 0652 12/28/18 1750 12/30/18 0429  AST 40 28 35 45*  ALT 73* 56*  51* 48*  ALKPHOS 754* 531* 525* 477*  BILITOT 0.8 0.4 0.7 0.4  PROT 6.3* 4.9* 5.0* 4.5*  ALBUMIN 3.0* 2.5* 2.4* 2.1*   Recent Labs  Lab 12/24/18 1328  LIPASE 24   No results for input(s): AMMONIA in the last 168 hours. Coagulation Profile: Recent Labs  Lab 12/28/18 1750 12/28/18 2334  INR 1.2 1.2   Cardiac Enzymes: No results for input(s): CKTOTAL, CKMB, CKMBINDEX, TROPONINI in the last 168 hours. BNP (last 3 results) No results for input(s): PROBNP in the last 8760 hours. HbA1C: No results for input(s): HGBA1C in the last 72 hours. CBG: No results for input(s): GLUCAP in the last 168 hours. Lipid Profile: No results for input(s): CHOL, HDL, LDLCALC, TRIG, CHOLHDL, LDLDIRECT in the last 72 hours. Thyroid Function Tests: No results for input(s): TSH, T4TOTAL, FREET4, T3FREE, THYROIDAB in the last 72 hours. Anemia Panel: Recent Labs    12/28/18 2334  VITAMINB12 1,219*  FERRITIN 353*  TIBC 168*  IRON 16*   Sepsis Labs: Recent Labs  Lab 12/24/18 1328 12/26/18 0555  12/27/18 7829 12/28/18 1708 12/28/18 2334 12/29/18 0250  PROCALCITON  --  42.75 23.49  --  33.26  --   LATICACIDVEN 1.7  --   --  1.0 0.6 0.6    Recent Results (from the past 240 hour(s))  Culture, Blood     Status: Abnormal   Collection Time: 12/24/18 11:25 AM   Specimen: BLOOD RIGHT ARM  Result Value Ref Range Status   Specimen Description   Final    BLOOD RIGHT ARM Performed at [facility], [place] [address], [place], [place] [address]    Special Requests   Final    BOTTLES DRAWN AEROBIC AND ANAEROBIC Blood Culture adequate volume   Culture  Setup Time   Final    AEROBIC BOTTLE ONLY GRAM POSITIVE COCCI CRITICAL RESULT CALLED TO, READ BACK BY AND VERIFIED WITH: J LEGGE PHARMD 12/25/18 2030 JDW GRAM VARIABLE ROD ANAEROBIC BOTTLE ONLY CRITICAL RESULT CALLED TO, READ BACK BY AND VERIFIED WITH: Damian Leavell [id number] Performed at [facility], [place]  [address], [place], [place] [address]    Culture STREPTOCOCCUS ANGINOSIS (A)  Final   Report Status 12/28/2018 FINAL  Final   Organism ID, Bacteria STREPTOCOCCUS ANGINOSIS  Final      Susceptibility   Streptococcus anginosis - MIC*    PENICILLIN 0.12 SENSITIVE Sensitive     CEFTRIAXONE 0.5 SENSITIVE Sensitive     ERYTHROMYCIN <=0.12 SENSITIVE Sensitive     LEVOFLOXACIN <=0.25 SENSITIVE Sensitive     VANCOMYCIN 0.5 SENSITIVE Sensitive     * STREPTOCOCCUS ANGINOSIS  Blood Culture ID Panel (Reflexed)     Status: Abnormal   Collection Time: 12/24/18 11:25 AM  Result Value Ref Range Status   Enterococcus species NOT DETECTED NOT DETECTED Final   Listeria monocytogenes NOT DETECTED NOT DETECTED Final   Staphylococcus species NOT DETECTED NOT DETECTED Final   Staphylococcus aureus (BCID) NOT DETECTED NOT DETECTED Final   Streptococcus species DETECTED (A) NOT DETECTED Final    Comment: Not Enterococcus species, Streptococcus agalactiae, Streptococcus pyogenes, or Streptococcus pneumoniae. CRITICAL RESULT CALLED TO, READ BACK BY AND VERIFIED WITH: J LEGGE PHARMD 12/25/18 2030 JDW    Streptococcus agalactiae NOT DETECTED NOT DETECTED Final   Streptococcus pneumoniae NOT DETECTED NOT DETECTED Final   Streptococcus pyogenes NOT DETECTED NOT DETECTED Final   Acinetobacter baumannii NOT DETECTED NOT DETECTED Final   Enterobacteriaceae species NOT DETECTED NOT DETECTED Final   Enterobacter cloacae complex NOT DETECTED NOT DETECTED Final   Escherichia coli NOT DETECTED NOT DETECTED Final   Klebsiella oxytoca NOT DETECTED NOT DETECTED Final   Klebsiella pneumoniae NOT DETECTED NOT DETECTED Final   Proteus species NOT DETECTED NOT DETECTED Final   Serratia marcescens NOT DETECTED NOT DETECTED Final   Haemophilus influenzae NOT DETECTED NOT DETECTED Final   Neisseria meningitidis NOT DETECTED NOT DETECTED Final   Pseudomonas aeruginosa NOT DETECTED NOT DETECTED Final   Candida albicans NOT DETECTED NOT  DETECTED Final   Candida glabrata NOT DETECTED NOT DETECTED Final   Candida krusei NOT DETECTED NOT DETECTED Final   Candida parapsilosis NOT DETECTED NOT DETECTED Final   Candida tropicalis NOT DETECTED NOT DETECTED Final    Comment: Performed at [facility], [place] [address] [place], [place] [address]  Culture, Blood     Status: None (Preliminary result)   Collection Time: 12/24/18 12:09 PM   Specimen: Porta Cath; Blood  Result Value Ref Range Status   Specimen Description  Final    PORTA CATH Performed at [facility], [place] [address], [place], [place] [address]    Special Requests   Final    NONE Performed at [facility], [place] [address], [place], [place] [address]    Culture  Setup Time   Final    GRAM NEGATIVE RODS ANAEROBIC BOTTLE ONLY CRITICAL RESULT CALLED TO, READ BACK BY AND VERIFIED WITH: Sheffield Slider PharmD 12:00 12/27/18 ([email or web address]) Performed at [facility], [place] [address] [place], [place] [address]    Culture GRAM NEGATIVE RODS  Final   Report Status PENDING  Incomplete  Blood Culture ID Panel (Reflexed)     Status: None   Collection Time: 12/24/18 12:09 PM  Result Value Ref Range Status   Enterococcus species NOT DETECTED NOT DETECTED Final   Listeria monocytogenes NOT DETECTED NOT DETECTED Final   Staphylococcus species NOT DETECTED NOT DETECTED Final   Staphylococcus aureus (BCID) NOT DETECTED NOT DETECTED Final   Streptococcus species NOT DETECTED NOT DETECTED Final   Streptococcus agalactiae NOT DETECTED NOT DETECTED Final   Streptococcus pneumoniae NOT DETECTED NOT DETECTED Final   Streptococcus pyogenes NOT DETECTED NOT DETECTED Final   Acinetobacter baumannii NOT DETECTED NOT DETECTED Final   Enterobacteriaceae species NOT DETECTED NOT DETECTED Final   Enterobacter cloacae complex NOT DETECTED NOT DETECTED Final   Escherichia coli NOT DETECTED NOT DETECTED Final   Klebsiella oxytoca NOT  DETECTED NOT DETECTED Final   Klebsiella pneumoniae NOT DETECTED NOT DETECTED Final   Proteus species NOT DETECTED NOT DETECTED Final   Serratia marcescens NOT DETECTED NOT DETECTED Final   Haemophilus influenzae NOT DETECTED NOT DETECTED Final   Neisseria meningitidis NOT DETECTED NOT DETECTED Final   Pseudomonas aeruginosa NOT DETECTED NOT DETECTED Final   Candida albicans NOT DETECTED NOT DETECTED Final   Candida glabrata NOT DETECTED NOT DETECTED Final   Candida krusei NOT DETECTED NOT DETECTED Final   Candida parapsilosis NOT DETECTED NOT DETECTED Final   Candida tropicalis NOT DETECTED NOT DETECTED Final    Comment: Performed at [facility], [place]. [address], [place], [place] [address]  Blood Culture ID Panel (Reflexed)     Status: None   Collection Time: 12/24/18 12:27 PM  Result Value Ref Range Status   Enterococcus species NOT DETECTED NOT DETECTED Final   Listeria monocytogenes NOT DETECTED NOT DETECTED Final   Staphylococcus species NOT DETECTED NOT DETECTED Final   Staphylococcus aureus (BCID) NOT DETECTED NOT DETECTED Final   Streptococcus species NOT DETECTED NOT DETECTED Final   Streptococcus agalactiae NOT DETECTED NOT DETECTED Final   Streptococcus pneumoniae NOT DETECTED NOT DETECTED Final   Streptococcus pyogenes NOT DETECTED NOT DETECTED Final   Acinetobacter baumannii NOT DETECTED NOT DETECTED Final   Enterobacteriaceae species NOT DETECTED NOT DETECTED Final   Enterobacter cloacae complex NOT DETECTED NOT DETECTED Final   Escherichia coli NOT DETECTED NOT DETECTED Final   Klebsiella oxytoca NOT DETECTED NOT DETECTED Final   Klebsiella pneumoniae NOT DETECTED NOT DETECTED Final   Proteus species NOT DETECTED NOT DETECTED Final   Serratia marcescens NOT DETECTED NOT DETECTED Final   Haemophilus influenzae NOT DETECTED NOT DETECTED Final   Neisseria meningitidis NOT DETECTED NOT DETECTED Final   Pseudomonas aeruginosa NOT DETECTED NOT DETECTED Final    Candida albicans NOT DETECTED NOT DETECTED Final   Candida glabrata NOT DETECTED NOT DETECTED Final   Candida krusei NOT DETECTED NOT DETECTED Final   Candida  parapsilosis NOT DETECTED NOT DETECTED Final   Candida tropicalis NOT DETECTED NOT DETECTED Final    Comment: Performed at [facility], [place] [address] [place], [place] [address]  Urine culture     Status: None   Collection Time: 12/24/18  1:28 PM   Specimen: Urine, Clean Catch  Result Value Ref Range Status   Specimen Description   Final    URINE, CLEAN CATCH Performed at [facility], [place] [address], [place], [place] [address]    Special Requests   Final    NONE Performed at [facility], [place] [address], [place], [place] [address]    Culture   Final    NO GROWTH Performed at [facility], [place] [address], [place], [place] [address]    Report Status 12/25/2018 FINAL  Final  SARS Coronavirus 2 (CEPHEID - Performed in [place] hospital lab), Hosp Order     Status: None   Collection Time: 12/24/18  1:28 PM   Specimen: Nasopharyngeal Swab  Result Value Ref Range Status   SARS Coronavirus 2 NEGATIVE NEGATIVE Final    Comment: (NOTE) If result is NEGATIVE SARS-CoV-2 target nucleic acids are NOT DETECTED. The SARS-CoV-2 RNA is generally detectable in upper and lower  respiratory specimens during the acute phase of infection. The lowest  concentration of SARS-CoV-2 viral copies this assay can detect is 250  copies / mL. A negative result does not preclude SARS-CoV-2 infection  and should not be used as the sole basis for treatment or other  patient management decisions.  A negative result may occur with  improper specimen collection / handling, submission of specimen other  than nasopharyngeal swab, presence of viral mutation(s) within the  areas targeted by this assay, and inadequate number of viral copies  (<250 copies / mL). A negative result must be combined with clinical   observations, patient history, and epidemiological information. If result is POSITIVE SARS-CoV-2 target nucleic acids are DETECTED. The SARS-CoV-2 RNA is generally detectable in upper and lower  respiratory specimens dur ing the acute phase of infection.  Positive  results are indicative of active infection with SARS-CoV-2.  Clinical  correlation with patient history and other diagnostic information is  necessary to determine patient infection status.  Positive results do  not rule out bacterial infection or co-infection with other viruses. If result is PRESUMPTIVE POSTIVE SARS-CoV-2 nucleic acids MAY BE PRESENT.   A presumptive positive result was obtained on the submitted specimen  and confirmed on repeat testing.  While 2019 novel coronavirus  (SARS-CoV-2) nucleic acids may be present in the submitted sample  additional confirmatory testing may be necessary for epidemiological  and / or clinical management purposes  to differentiate between  SARS-CoV-2 and other Sarbecovirus currently known to infect humans.  If clinically indicated additional testing with an alternate test  methodology [phone]) is advised. The SARS-CoV-2 RNA is generally  detectable in upper and lower respiratory sp ecimens during the acute  phase of infection. The expected result is Negative. Fact Sheet for Patients:  StrictlyIdeas.no Fact Sheet for Healthcare Providers: BankingDealers.co.za This test is not yet approved or cleared by the [place] FDA and has been authorized for detection and/or diagnosis of SARS-CoV-2 by FDA under an Emergency Use Authorization (EUA).  This EUA will remain in effect (meaning this test can be used) for the duration of the COVID-19 declaration under Section 564(b)(1) of the Act, 21 U.S.C. section 360bbb-3(b)(1), unless the authorization is  terminated or revoked sooner. Performed at [facility], [place]  [address], [place], [place] [address]   Culture, blood (routine x 2)     Status: None   Collection Time: 12/24/18  5:22 PM   Specimen: BLOOD  Result Value Ref Range Status   Specimen Description   Final    BLOOD LEFT ANTECUBITAL Performed at [facility], [place] [address], [place], [place] [address]    Special Requests   Final    BOTTLES DRAWN AEROBIC AND ANAEROBIC Blood Culture results may not be optimal due to an excessive volume of blood received in culture bottles Performed at [place] [address], [place], [place] [address]    Culture   Final    NO GROWTH 5 DAYS Performed at [facility], [place] [address], [place], [place] [address]    Report Status 12/29/2018 FINAL  Final  Culture, blood (routine x 2)     Status: None   Collection Time: 12/24/18  5:29 PM   Specimen: BLOOD  Result Value Ref Range Status   Specimen Description   Final    BLOOD RIGHT ANTECUBITAL Performed at [facility], [place] [address] [place], [place] [address]    Special Requests   Final    BOTTLES DRAWN AEROBIC AND ANAEROBIC Blood Culture adequate volume Performed at [place] [address] [place], [place] [address]    Culture   Final    NO GROWTH 5 DAYS Performed at [facility], [place] [address] [place], [place] [address]    Report Status 12/29/2018 FINAL  Final  Culture, blood (Routine x 2)     Status: None (Preliminary result)   Collection Time: 12/28/18  5:50 PM   Specimen: BLOOD  Result Value Ref Range Status   Specimen Description   Final    BLOOD BLOOD LEFT FOREARM Performed at [place] [address], [place], [place] [address]    Special Requests   Final    BOTTLES DRAWN AEROBIC AND ANAEROBIC Blood Culture adequate volume Performed at [place] [address] [place], [place] [address]    Culture   Final    NO GROWTH 2 DAYS Performed at [place] [address] [place], [place] [address]    Report Status PENDING  Incomplete  Culture, blood (Routine x 2)     Status: None (Preliminary result)   Collection Time: 12/28/18  5:50 PM   Specimen: BLOOD  Result Value Ref Range Status   Specimen Description   Final    BLOOD RIGHT MIDLINE Performed at [place] [address] [place], [place] [address]    Special Requests   Final    BOTTLES DRAWN AEROBIC AND ANAEROBIC Blood Culture adequate volume Performed at [place] [address] [place], [place] [address]    Culture   Final    NO GROWTH 2 DAYS Performed at [place] [address] [place],  [address]    Report Status PENDING  Incomplete  SARS Coronavirus 2 (CEPHEID - Performed in [place] hospital lab), Hosp Order     Status: None   Collection Time: 12/28/18  5:52 PM   Specimen: Nasopharyngeal Swab  Result Value Ref Range Status   SARS Coronavirus 2 NEGATIVE NEGATIVE Final    Comment: (NOTE) If result is NEGATIVE SARS-CoV-2 target nucleic acids are NOT DETECTED.  The SARS-CoV-2 RNA is generally detectable in upper and lower  respiratory specimens during the acute phase of infection. The lowest  concentration of SARS-CoV-2 viral copies this assay can detect is 250  copies / mL. A negative result does not preclude SARS-CoV-2 infection  and should not be used as the sole basis for treatment or other  patient management decisions.  A negative result may occur with  improper specimen collection / handling, submission of specimen other  than nasopharyngeal swab, presence of viral mutation(s) within the  areas targeted by this assay, and inadequate number of viral copies  (<250 copies / mL). A negative result must be combined with clinical  observations, patient history, and epidemiological information. If result is POSITIVE SARS-CoV-2 target nucleic acids are DETECTED. The SARS-CoV-2 RNA is generally detectable in upper  and lower  respiratory specimens dur ing the acute phase of infection.  Positive  results are indicative of active infection with SARS-CoV-2.  Clinical  correlation with patient history and other diagnostic information is  necessary to determine patient infection status.  Positive results do  not rule out bacterial infection or co-infection with other viruses. If result is PRESUMPTIVE POSTIVE SARS-CoV-2 nucleic acids MAY BE PRESENT.   A presumptive positive result was obtained on the submitted specimen  and confirmed on repeat testing.  While 2019 novel coronavirus  (SARS-CoV-2) nucleic acids may be present in the submitted sample  additional confirmatory testing may be necessary for epidemiological  and / or clinical management purposes  to differentiate between  SARS-CoV-2 and other Sarbecovirus currently known to infect humans.  If clinically indicated additional testing with an alternate test  methodology [phone]) is advised. The SARS-CoV-2 RNA is generally  detectable in upper and lower respiratory sp ecimens during the acute  phase of infection. The expected result is Negative. Fact Sheet for Patients:  StrictlyIdeas.no Fact Sheet for Healthcare Providers: BankingDealers.co.za This test is not yet approved or cleared by the [place] FDA and has been authorized for detection and/or diagnosis of SARS-CoV-2 by FDA under an Emergency Use Authorization (EUA).  This EUA will remain in effect (meaning this test can be used) for the duration of the COVID-19 declaration under Section 564(b)(1) of the Act, 21 U.S.C. section 360bbb-3(b)(1), unless the authorization is terminated or revoked sooner. Performed at [facility], [place] [address], [place], [place] [address]          Radiology Studies: Dg Chest 2 View  Result Date: 12/28/2018 CLINICAL DATA:  Sepsis EXAM: CHEST - 2 VIEW COMPARISON:  December 24, 2018  FINDINGS: The right-sided Port-A-Cath has been removed. There is a new right-sided PICC line with tip terminating over the distal SVC. The cardiac size is stable. There is no pneumothorax. No large pleural effusion. The lung volumes are slightly low. IMPRESSION: No active cardiopulmonary disease. Electronically Signed   By: Constance Holster M.D.   On: 12/28/2018 18:53   Dg Chest Port 1 View  Result Date: 12/28/2018 CLINICAL DATA:  Sepsis hypertension EXAM: PORTABLE CHEST 1 VIEW COMPARISON:  12/28/2018, 12/24/2018 FINDINGS: Right upper extremity catheter tip overlies the SVC. No acute consolidation or effusion. Normal cardiomediastinal silhouette. No pneumothorax. IMPRESSION: No active disease. Electronically Signed   By: Donavan Foil M.D.   On: 12/28/2018 23:03        Scheduled Meds: . amLODipine  10 mg Oral QHS  . calamine   Topical BID  . doxazosin  1 mg Oral QHS  . enoxaparin (LOVENOX) injection  40 mg Subcutaneous Daily  . ferrous sulfate  325 mg Oral BID WC  . gabapentin  200 mg Oral BID  . irbesartan  300 mg Oral Daily  . potassium chloride SA  20 mEq Oral BID  . tamsulosin  0.4 mg Oral QPC supper   Continuous Infusions: . sodium chloride 75 mL/hr at 12/30/18 0958  . ceFEPime (MAXIPIME) IV 2 g (12/30/18 0959)  . metronidazole 500 mg (12/30/18 0651)  . vancomycin 1,500 mg (12/30/18 0120)     LOS: 2 days   Time spent= 35 mins     Arsenio Loader, MD [facility]  If 7PM-7AM, please contact night-coverage [email or web address] 12/30/2018, 11:39 AM

## 2018-12-31 ENCOUNTER — Ambulatory Visit: Payer: BC Managed Care – PPO

## 2018-12-31 ENCOUNTER — Inpatient Hospital Stay: Payer: BC Managed Care – PPO | Admitting: Nurse Practitioner

## 2018-12-31 ENCOUNTER — Inpatient Hospital Stay: Payer: BC Managed Care – PPO

## 2018-12-31 ENCOUNTER — Inpatient Hospital Stay (HOSPITAL_COMMUNITY): Payer: BC Managed Care – PPO

## 2018-12-31 DIAGNOSIS — R3911 Hesitancy of micturition: Secondary | ICD-10-CM

## 2018-12-31 DIAGNOSIS — K769 Liver disease, unspecified: Secondary | ICD-10-CM

## 2018-12-31 DIAGNOSIS — B966 Bacteroides fragilis [B. fragilis] as the cause of diseases classified elsewhere: Secondary | ICD-10-CM

## 2018-12-31 DIAGNOSIS — B955 Unspecified streptococcus as the cause of diseases classified elsewhere: Secondary | ICD-10-CM

## 2018-12-31 DIAGNOSIS — R7881 Bacteremia: Secondary | ICD-10-CM

## 2018-12-31 DIAGNOSIS — Z95828 Presence of other vascular implants and grafts: Secondary | ICD-10-CM

## 2018-12-31 DIAGNOSIS — C259 Malignant neoplasm of pancreas, unspecified: Secondary | ICD-10-CM

## 2018-12-31 DIAGNOSIS — R509 Fever, unspecified: Secondary | ICD-10-CM

## 2018-12-31 LAB — DIFFERENTIAL
Basophils Absolute: 0 10*3/uL (ref 0.0–0.1)
Basophils Relative: 0 %
Eosinophils Absolute: 0 10*3/uL (ref 0.0–0.5)
Eosinophils Relative: 1 %
Lymphocytes Relative: 11 %
Lymphs Abs: 0.3 10*3/uL — ABNORMAL LOW (ref 0.7–4.0)
Monocytes Absolute: 0.2 10*3/uL (ref 0.1–1.0)
Monocytes Relative: 10 %
Neutro Abs: 1.9 10*3/uL (ref 1.7–7.7)
Neutrophils Relative %: 77 %

## 2018-12-31 LAB — BLOOD CULTURE ID PANEL (REFLEXED)
Acinetobacter baumannii: NOT DETECTED
Candida albicans: NOT DETECTED
Candida glabrata: DETECTED — AB
Candida krusei: NOT DETECTED
Candida parapsilosis: NOT DETECTED
Candida tropicalis: NOT DETECTED
Enterobacter cloacae complex: NOT DETECTED
Enterobacteriaceae species: NOT DETECTED
Enterococcus species: NOT DETECTED
Escherichia coli: NOT DETECTED
Haemophilus influenzae: NOT DETECTED
Klebsiella oxytoca: NOT DETECTED
Klebsiella pneumoniae: NOT DETECTED
Listeria monocytogenes: NOT DETECTED
Neisseria meningitidis: NOT DETECTED
Proteus species: NOT DETECTED
Pseudomonas aeruginosa: NOT DETECTED
Serratia marcescens: NOT DETECTED
Staphylococcus aureus (BCID): NOT DETECTED
Staphylococcus species: NOT DETECTED
Streptococcus agalactiae: NOT DETECTED
Streptococcus pneumoniae: NOT DETECTED
Streptococcus pyogenes: NOT DETECTED
Streptococcus species: NOT DETECTED

## 2018-12-31 LAB — COMPREHENSIVE METABOLIC PANEL
ALT: 46 U/L — ABNORMAL HIGH (ref 0–44)
AST: 37 U/L (ref 15–41)
Albumin: 2.3 g/dL — ABNORMAL LOW (ref 3.5–5.0)
Alkaline Phosphatase: 522 U/L — ABNORMAL HIGH (ref 38–126)
Anion gap: 6 (ref 5–15)
BUN: 5 mg/dL — ABNORMAL LOW (ref 8–23)
CO2: 23 mmol/L (ref 22–32)
Calcium: 7.8 mg/dL — ABNORMAL LOW (ref 8.9–10.3)
Chloride: 106 mmol/L (ref 98–111)
Creatinine, Ser: 0.63 mg/dL (ref 0.61–1.24)
GFR calc Af Amer: >60 mL/min (ref >60)
GFR calc non Af Amer: >60 mL/min (ref >60)
Glucose, Bld: 110 mg/dL — ABNORMAL HIGH (ref 70–99)
Potassium: 3.6 mmol/L (ref 3.5–5.1)
Sodium: 135 mmol/L (ref 135–145)
Total Bilirubin: 0.5 mg/dL (ref 0.3–1.2)
Total Protein: 4.8 g/dL — ABNORMAL LOW (ref 6.5–8.1)

## 2018-12-31 LAB — CULTURE, BLOOD (SINGLE)

## 2018-12-31 LAB — CBC
HCT: 26.8 % — ABNORMAL LOW (ref 39.0–52.0)
Hemoglobin: 8.5 g/dL — ABNORMAL LOW (ref 13.0–17.0)
MCH: 27.5 pg (ref 26.0–34.0)
MCHC: 31.7 g/dL (ref 30.0–36.0)
MCV: 86.7 fL (ref 80.0–100.0)
Platelets: 124 10*3/uL — ABNORMAL LOW (ref 150–400)
RBC: 3.09 MIL/uL — ABNORMAL LOW (ref 4.22–5.81)
RDW: 14.8 % (ref 11.5–15.5)
WBC: 2.4 10*3/uL — ABNORMAL LOW (ref 4.0–10.5)
nRBC: 0 % (ref 0.0–0.2)

## 2018-12-31 LAB — GRAM STAIN: Gram Stain: NONE SEEN

## 2018-12-31 LAB — PROCALCITONIN: Procalcitonin: 8.88 ng/mL

## 2018-12-31 LAB — MAGNESIUM: Magnesium: 1.8 mg/dL (ref 1.7–2.4)

## 2018-12-31 MED ORDER — POTASSIUM CHLORIDE 10 MEQ/100ML IV SOLN
10.0000 meq | INTRAVENOUS | Status: AC
  Administered 2018-12-31 (×4): 10 meq via INTRAVENOUS
  Filled 2018-12-31 (×3): qty 100

## 2018-12-31 MED ORDER — SODIUM CHLORIDE 0.9 % IV SOLN
100.0000 mg | INTRAVENOUS | Status: DC
  Administered 2019-01-01 – 2019-01-04 (×4): 100 mg via INTRAVENOUS
  Filled 2018-12-31 (×4): qty 100

## 2018-12-31 MED ORDER — SODIUM CHLORIDE 0.9 % IV SOLN
200.0000 mg | Freq: Once | INTRAVENOUS | Status: AC
  Administered 2018-12-31: 200 mg via INTRAVENOUS
  Filled 2018-12-31: qty 200

## 2018-12-31 MED ORDER — MAGNESIUM SULFATE 2 GM/50ML IV SOLN
2.0000 g | Freq: Once | INTRAVENOUS | Status: AC
  Administered 2018-12-31: 2 g via INTRAVENOUS
  Filled 2018-12-31: qty 50

## 2018-12-31 MED ORDER — POTASSIUM CHLORIDE 10 MEQ/100ML IV SOLN
INTRAVENOUS | Status: AC
  Administered 2018-12-31: 10 meq via INTRAVENOUS
  Filled 2018-12-31: qty 100

## 2018-12-31 MED ORDER — SODIUM CHLORIDE 0.9 % IV SOLN
1.0000 g | INTRAVENOUS | Status: DC
  Administered 2018-12-31 – 2019-01-04 (×5): 1000 mg via INTRAVENOUS
  Filled 2018-12-31 (×5): qty 1

## 2018-12-31 MED ORDER — LIDOCAINE HCL 1 % IJ SOLN
INTRAMUSCULAR | Status: AC
  Filled 2018-12-31: qty 20

## 2018-12-31 NOTE — Procedures (Signed)
Ultrasound-guided diagnostic and therapeutic paracentesis performed yielding 2.6 liters of slightly hazy, light yellow fluid. No immediate complications.  A portion of the fluid was submitted to the lab for preordered studies. EBL none.

## 2018-12-31 NOTE — Progress Notes (Signed)
Pharmacy Antibiotic Note  Cory Kelly is a 63 y.o. male admitted on 12/28/2018 with sepsis.  Pharmacy has been consulted for vanc/cefepime dosing.  He was recently dc due to bacteremia with strep anginosis and bacteroides. He was readmitted again after discharge and started on vanc/cefepime/flagyl. Porta cath has been removed and ID will be consulted on the patient. His renal fxn has been stable. We will hold vanc levels until further plan by ID.  Plan:  Vanc 1g x 1 given at [place] in ED, start vanc 1500mg  IV q12 thereafter - goal AUC 400-550  Cefepime 2g IV q8  Height: 6\' 5"  (195.6 cm) Weight: 195 lb 5.2 oz (88.6 kg) IBW/kg (Calculated) : 89.1  Temp (24hrs), Avg:98.4 F (36.9 C), Min:97.9 F (36.6 C), Max:98.7 F (37.1 C)  Recent Labs  Lab 12/24/18 1328  12/27/18 0652 12/28/18 1708 12/28/18 1750 12/28/18 2334 12/29/18 0250 12/30/18 0429 12/31/18 0327  WBC  --    < > 2.2*  --  5.7  --  3.5* 2.3* 2.4*  CREATININE  --    < > 0.68  --  0.88  --  0.78 0.61 0.63  LATICACIDVEN 1.7  --   --  1.0  --  0.6 0.6  --   --    < > = values in this interval not displayed.    Estimated Creatinine Clearance: 121.5 mL/min (by C-G formula based on SCr of 0.63 mg/dL).    No Known Allergies  Onnie Boer, PharmD, BCIDP, AAHIVP, CPP Infectious Disease Pharmacist 12/31/2018 11:27 AM

## 2018-12-31 NOTE — Progress Notes (Addendum)
HEMATOLOGY-ONCOLOGY PROGRESS NOTE  SUBJECTIVE: The patient is readmitted with fevers and chills.  Has been afebrile since admission.  Ports that his abdomen feels bloated.  He also states that he has developed some lower extremity edema.  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[place])   08/31/2017 - 11/22/2017 Chemotherapy   The patient had palonosetron (ALOXI) injection 0.25 mg, 0.25 mg, Intravenous,  Once, 6 of 6 cycles Administration: 0.25 mg (08/31/2017), 0.25 mg (09/14/2017), 0.25 mg (09/28/2017), 0.25 mg (10/12/2017), 0.25 mg (10/26/2017), 0.25 mg (11/09/2017) pegfilgrastim-cbqv (UDENYCA) injection 6 mg, 6 mg, Subcutaneous, Once, 6 of 6 cycles Administration: 6 mg (09/02/2017), 6 mg (09/16/2017), 6 mg (09/30/2017), 6 mg (10/14/2017), 6 mg (10/28/2017), 6 mg (11/11/2017) irinotecan (CAMPTOSAR) 360 mg in sodium chloride 0.9 % 500 mL chemo infusion, 150 mg/m2 = 360 mg (100 % of original dose 150 mg/m2), Intravenous,  Once, 6 of 6 cycles Dose modification: 150 mg/m2 (original dose 150 mg/m2, Cycle 1, Reason: Provider Judgment) Administration: 360 mg (08/31/2017), 360 mg (09/14/2017), 360 mg (09/28/2017), 340 mg (10/12/2017), 340 mg (10/26/2017), 340 mg (11/09/2017) leucovorin 952 mg in sodium chloride 0.9 % 250 mL infusion, 400 mg/m2 = 952 mg, Intravenous,  Once, 6 of 6 cycles Administration: 952 mg (08/31/2017), 952 mg (09/14/2017), 952 mg (09/28/2017), 888 mg (10/12/2017), 888 mg (10/26/2017), 888 mg (11/09/2017) oxaliplatin (ELOXATIN) 200 mg in dextrose 5 % 500 mL chemo infusion, 85 mg/m2 = 200 mg, Intravenous,  Once, 6 of 6 cycles Dose modification: 65 mg/m2 (original dose 85 mg/m2, Cycle 4, Reason: Provider Judgment) Administration: 200 mg (08/31/2017), 200 mg (09/14/2017), 200 mg (09/28/2017), 145 mg (10/12/2017), 150 mg (10/26/2017), 150 mg (11/09/2017) fosaprepitant (EMEND) 150 mg, dexamethasone (DECADRON) 12 mg in sodium chloride 0.9 %  145 mL IVPB, , Intravenous,  Once, 6 of 6 cycles Administration:  (08/31/2017),  (09/14/2017),  (09/28/2017),  (10/12/2017),  (10/26/2017),  (11/09/2017) fluorouracil (ADRUCIL) 5,700 mg in sodium chloride 0.9 % 136 mL chemo infusion, 2,400 mg/m2 = 5,700 mg, Intravenous, 1 Day/Dose, 6 of 6 cycles Administration: 5,700 mg (08/31/2017), 5,700 mg (09/14/2017), 5,700 mg (09/28/2017), 5,350 mg (10/12/2017), 5,350 mg (10/26/2017), 5,350 mg (11/09/2017)  for chemotherapy treatment.    12/06/2017 - 02/28/2018 Chemotherapy   The patient had gemcitabine (GEMZAR) 1,786 mg in sodium chloride 0.9 % 250 mL chemo infusion, 800 mg/m2 = 1,786 mg (80 % of original dose 1,000 mg/m2), Intravenous,  Once, 4 of 4 cycles Dose modification: 800 mg/m2 (original dose 1,000 mg/m2, Cycle 1, Reason: Provider Judgment), 800 mg/m2 (original dose 1,000 mg/m2, Cycle 1, Reason: Provider Judgment, Comment: call from MD to decrease dose due to counts) Administration: 1,786 mg (12/20/2017), 1,786 mg (12/06/2017), 1,786 mg (01/03/2018), 1,786 mg (01/17/2018), 1,786 mg (01/31/2018), 1,786 mg (02/14/2018), 1,786 mg (02/28/2018) PACLitaxel-protein bound (ABRAXANE) chemo infusion 225 mg, 100 mg/m2 = 225 mg (80 % of original dose 125 mg/m2), Intravenous, Once, 3 of 3 cycles Dose modification: 100 mg/m2 (original dose 125 mg/m2, Cycle 1, Reason: Provider Judgment), 100 mg/m2 (original dose 125 mg/m2, Cycle 1, Reason: Provider Judgment, Comment: call from MD to decrease dose based on counts) Administration: 225 mg (12/20/2017), 225 mg (12/06/2017), 225 mg (01/03/2018), 225 mg (01/17/2018)  for chemotherapy treatment.    07/04/2018 Cancer Staging   Staging form: Exocrine Pancreas, AJCC 8th Edition - Pathologic: Stage IV (cM1) - Signed by Ladell Pier, MD on 07/04/2018   08/21/2018 -  Chemotherapy   The  patient had palonosetron (ALOXI) injection 0.25 mg, 0.25 mg, Intravenous,  Once, 7 of 8 cycles Administration: 0.25 mg (08/21/2018), 0.25 mg (09/03/2018), 0.25 mg (09/18/2018),  0.25 mg (10/01/2018), 0.25 mg (10/16/2018), 0.25 mg (11/14/2018), 0.25 mg (12/10/2018) pegfilgrastim-cbqv (UDENYCA) injection 6 mg, 6 mg, Subcutaneous, Once, 7 of 8 cycles Administration: 6 mg (08/23/2018), 6 mg (09/05/2018), 6 mg (09/20/2018), 6 mg (10/03/2018), 6 mg (10/18/2018), 6 mg (11/16/2018) irinotecan (CAMPTOSAR) 360 mg in dextrose 5 % 500 mL chemo infusion, 150 mg/m2 = 360 mg (100 % of original dose 150 mg/m2), Intravenous,  Once, 7 of 8 cycles Dose modification: 150 mg/m2 (original dose 150 mg/m2, Cycle 1, Reason: Provider Judgment), 112.5 mg/m2 (original dose 150 mg/m2, Cycle 1, Reason: Change in LFTs), 90 mg/m2 (original dose 150 mg/m2, Cycle 6, Reason: Provider Judgment), 90 mg/m2 (original dose 90 mg/m2, Cycle 7) Administration: 260 mg (08/21/2018), 260 mg (09/03/2018), 260 mg (09/18/2018), 260 mg (10/01/2018), 200 mg (11/14/2018), 200 mg (10/16/2018), 200 mg (12/10/2018) leucovorin 952 mg in dextrose 5 % 250 mL infusion, 400 mg/m2 = 952 mg, Intravenous,  Once, 7 of 8 cycles Dose modification: 300 mg/m2 (original dose 400 mg/m2, Cycle 1, Reason: Provider Judgment), 300 mg/m2 (original dose 300 mg/m2, Cycle 7) Administration: 696 mg (08/21/2018), 696 mg (09/03/2018), 696 mg (09/18/2018), 676 mg (10/01/2018), 676 mg (11/14/2018), 676 mg (10/16/2018), 676 mg (12/10/2018) oxaliplatin (ELOXATIN) 155 mg in dextrose 5 % 500 mL chemo infusion, 65 mg/m2 = 155 mg (100 % of original dose 65 mg/m2), Intravenous,  Once, 7 of 8 cycles Dose modification: 65 mg/m2 (original dose 65 mg/m2, Cycle 1, Reason: Provider Judgment) Administration: 150 mg (08/21/2018), 150 mg (09/03/2018), 150 mg (09/18/2018), 150 mg (10/01/2018), 150 mg (10/16/2018), 150 mg (11/14/2018), 150 mg (12/10/2018) fosaprepitant (EMEND) 150 mg, dexamethasone (DECADRON) 12 mg in sodium chloride 0.9 % 145 mL IVPB, , Intravenous,  Once, 7 of 8 cycles Administration:  (08/21/2018),  (09/03/2018),  (09/18/2018),  (10/01/2018),  (10/16/2018),  (11/14/2018),  (12/10/2018) fluorouracil  (ADRUCIL) 5,700 mg in sodium chloride 0.9 % 136 mL chemo infusion, 2,400 mg/m2 = 5,700 mg, Intravenous, 1 Day/Dose, 7 of 8 cycles Dose modification: 1,800 mg/m2 (original dose 2,400 mg/m2, Cycle 1, Reason: Change in LFTs) Administration: 4,200 mg (08/21/2018), 4,200 mg (09/03/2018), 4,200 mg (09/18/2018), 4,050 mg (10/01/2018), 4,050 mg (10/16/2018), 4,050 mg (11/14/2018), 4,050 mg (12/10/2018)  for chemotherapy treatment.       REVIEW OF SYSTEMS:   Constitutional: No fever or chills since admission. Eyes: Denies blurriness of vision Ears, nose, mouth, throat, and face: Denies mucositis or sore throat Respiratory: Denies cough, dyspnea or wheezes Cardiovascular: Denies palpitation, chest discomfort Gastrointestinal: Reports abdominal bloating but no abdominal pain, nausea, vomiting, constipation, diarrhea. Skin: Denies abnormal skin rashes Lymphatics: Denies new lymphadenopathy or easy bruising Neurological:Denies numbness, tingling or new weaknesses Behavioral/Psych: Mood is stable, no new changes  Extremities: Reports lower extremity edema. All other systems were reviewed with the patient and are negative.  I have reviewed the past medical history, past surgical history, social history and family history with the patient and they are unchanged from previous note.   PHYSICAL EXAMINATION:  Vitals:   12/30/18 2058 12/31/18 0510  BP: 127/85 132/87  Pulse: (!) 56 62  Resp:  20  Temp: 98.6 F (37 C) 97.9 F (36.6 C)  SpO2: 98% 97%   Filed Weights   12/28/18 1703  Weight: 195 lb 5.2 oz (88.6 kg)    Intake/Output from previous day: 06/28 0701 - 06/29 0700 In: 3330.3 [I.V.:1518; IV  Piggyback:1812.3] Out: -   GENERAL:alert, no distress and comfortable SKIN: skin color, texture, turgor are normal, no rashes or significant lesions OROPHARYNX:no exudate, no erythema and lips, buccal mucosa, and tongue normal  NECK: supple, thyroid normal size, non-tender, without nodularity LYMPH:  no  palpable lymphadenopathy in the cervical, axillary or inguinal LUNGS: clear to auscultation and percussion with normal breathing effort HEART: regular rate & rhythm and no murmurs and no lower extremity edema ABDOMEN: Positive bowel sounds.  Minimal ascites noted.  No abdominal pain with palpation. Musculoskeletal:no cyanosis of digits and no clubbing  NEURO: alert & oriented x 3 with fluent speech, no focal motor/sensory deficits  LABORATORY DATA:  I have reviewed the data as listed CMP Latest Ref Rng & Units 12/31/2018 12/30/2018 12/29/2018  Glucose 70 - 99 mg/dL 110(H) 124(H) 134(H)  BUN 8 - 23 mg/dL 5(L) 7(L) 9  Creatinine 0.61 - 1.24 mg/dL 0.63 0.61 0.78  Sodium 135 - 145 mmol/L 135 135 133(L)  Potassium 3.5 - 5.1 mmol/L 3.6 3.3(L) 3.2(L)  Chloride 98 - 111 mmol/L 106 106 104  CO2 22 - 32 mmol/L _0 Calcium 8.9 - 10.3 mg/dL 7.8(L) 7.5(L) 7.5(L)  Total Protein 6.5 - 8.1 g/dL 4.8(L) 4.5(L) -  Total Bilirubin 0.3 - 1.2 mg/dL 0.5 0.4 -  Alkaline Phos 38 - 126 U/L 522(H) 477(H) -  AST 15 - 41 U/L 37 45(H) -  ALT 0 - 44 U/L 46(H) 48(H) -    Lab Results  Component Value Date   WBC 2.4 (L) 12/31/2018   HGB 8.5 (L) 12/31/2018   HCT 26.8 (L) 12/31/2018   MCV 86.7 12/31/2018   PLT 124 (L) 12/31/2018   NEUTROABS 5.3 12/28/2018    Dg Chest 2 View  Result Date: 12/28/2018 CLINICAL DATA:  Sepsis EXAM: CHEST - 2 VIEW COMPARISON:  December 24, 2018 FINDINGS: The right-sided Port-A-Cath has been removed. There is a new right-sided PICC line with tip terminating over the distal SVC. The cardiac size is stable. There is no pneumothorax. No large pleural effusion. The lung volumes are slightly low. IMPRESSION: No active cardiopulmonary disease. Electronically Signed   By: Constance Holster M.D.   On: 12/28/2018 18:53   Mr Abdomen W Wo Contrast  Result Date: 12/04/2018 CLINICAL DATA:  Follow-up pancreas cancer. Assess for worsening of disease. EXAM: MRI ABDOMEN WITHOUT AND WITH CONTRAST  TECHNIQUE: Multiplanar multisequence MR imaging of the abdomen was performed both before and after the administration of intravenous contrast. CONTRAST:  10 cc Gadavist COMPARISON:  11/06/2018 FINDINGS: Lower chest: Tiny bilateral pleural effusions are identified, similar to previous exam. Hepatobiliary: Multiple liver metastases are again noted. Index lesion within segment 5 measures 1.7 by 1.3 cm, image 44/1003. Previously 1.7 x 1.5 cm. The index lesion within segment 2 measures 1.0 by 0.8 cm, image 29/1003. Previously 1.2 x 0.8 cm. New lesion within segment 4 a measures 1 by 0.9 cm, image 31/1002. The remainder of the liver metastasis are relatively stable in the interval. Gallbladder unremarkable. Common bile duct stent in place without intrahepatic bile duct dilatation. Pancreas: The soft tissue mass within the head of pancreas measures approximately 3.7 x 4.1 cm, image 59/1001. Previously 3.5 x 3.1 cm. Diffuse atrophy and pancreatic ductal dilatation is again noted within the body and tail. Encasement and obstruction of the portal venous confluence is again identified with distal reconstitution of the portal vein via collaterals. Spleen: Splenomegaly is again noted. The spleen has a cranial caudal dimension 20.1 cm.  Similar. Adrenals/Urinary Tract: Normal appearance of the adrenal glands. No kidney mass or hydronephrosis. Simple appearing inferior pole right kidney cyst measures 2.7 cm. Stomach/Bowel: Visualized portions within the abdomen are unremarkable. Vascular/Lymphatic: No evidence for abdominal aortic aneurysm. Esophageal and splenic varices as well as portosystemic venous collaterals compatible with portal venous hypertension. No adenopathy identified. Other:  Interval increase in volume of abdominal ascites. Musculoskeletal: No suspicious bone lesions identified. IMPRESSION: 1. There is been mild increase in size of mass involving head of pancreas. 2. Diffuse hepatic metastasis. The previous index  lesions are unchanged in size compared with previous exam. A single new metastasis is identified within the medial segment of left lobe of liver. 3. 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 Abdominal ascites has increased in the interval. Electronically Signed   By: Kerby Moors M.D.   On: 12/04/2018 11:27   Ct Abdomen Pelvis W Contrast  Result Date: 12/24/2018 CLINICAL DATA:  Abdominal distension, fever, history of pancreatic cancer EXAM: CT ABDOMEN AND PELVIS WITH CONTRAST TECHNIQUE: Multidetector CT imaging of the abdomen and pelvis was performed using the standard protocol following bolus administration of intravenous contrast. CONTRAST:  119m OMNIPAQUE IOHEXOL 300 MG/ML  SOLN COMPARISON:  MR abdomen pelvis, 12/04/2018, 10/30/2018 FINDINGS: Lower chest: No acute abnormality. Hepatobiliary: Known liver metastases are poorly appreciated by single-phase CT, for example in the dome of the liver (series 2, image 14). No gallstones or gallbladder wall thickening. Common bile duct stent is positioned tip in the descending portion of the duodenum with post stenting pneumobilia. Pancreas: Discrete pancreatic head mass is not well appreciated, primarily appreciated by mass effect on the confluence of the portal vein (series 2, image 38. There is atrophy of the pancreatic body and tail and dilatation pancreatic duct. Spleen: Splenomegaly. Adrenals/Urinary Tract: Adrenal glands are unremarkable. Nonobstructive bilateral nephrolithiasis. Bladder is unremarkable. Stomach/Bowel: Stomach is within normal limits. Appendix is not clearly visualized. No evidence of bowel wall thickening, distention, or inflammatory changes. Large burden of stool in the distal colon and rectum. Vascular/Lymphatic: Aortic atherosclerosis. No enlarged abdominal or pelvic lymph nodes. Reproductive: No mass or other significant abnormality. Other: No abdominal wall hernia or abnormality. Moderate volume ascites.  Musculoskeletal: No acute or significant osseous findings. IMPRESSION: 1. Redemonstrated pancreatic malignancy with hepatic metastases, status post common bile duct stenting. Primary mass and hepatic metastases are better appreciated by recent multiphasic MRI. 2.  Moderate volume ascites, which is similar to prior MRI. 3.  Splenomegaly. 4.  Large burden of stool in the distal colon and rectum. 5.  Nonobstructive bilateral nephrolithiasis. Electronically Signed   By: AEddie CandleM.D.   On: 12/24/2018 14:35   Mr 3d Recon At Scanner  Result Date: 12/04/2018 CLINICAL DATA:  Follow-up pancreas cancer. Assess for worsening of disease. EXAM: MRI ABDOMEN WITHOUT AND WITH CONTRAST TECHNIQUE: Multiplanar multisequence MR imaging of the abdomen was performed both before and after the administration of intravenous contrast. CONTRAST:  10 cc Gadavist COMPARISON:  11/06/2018 FINDINGS: Lower chest: Tiny bilateral pleural effusions are identified, similar to previous exam. Hepatobiliary: Multiple liver metastases are again noted. Index lesion within segment 5 measures 1.7 by 1.3 cm, image 44/1003. Previously 1.7 x 1.5 cm. The index lesion within segment 2 measures 1.0 by 0.8 cm, image 29/1003. Previously 1.2 x 0.8 cm. New lesion within segment 4 a measures 1 by 0.9 cm, image 31/1002. The remainder of the liver metastasis are relatively stable in the interval. Gallbladder unremarkable. Common bile duct stent in place  without intrahepatic bile duct dilatation. Pancreas: The soft tissue mass within the head of pancreas measures approximately 3.7 x 4.1 cm, image 59/1001. Previously 3.5 x 3.1 cm. Diffuse atrophy and pancreatic ductal dilatation is again noted within the body and tail. Encasement and obstruction of the portal venous confluence is again identified with distal reconstitution of the portal vein via collaterals. Spleen: Splenomegaly is again noted. The spleen has a cranial caudal dimension 20.1 cm. Similar.  Adrenals/Urinary Tract: Normal appearance of the adrenal glands. No kidney mass or hydronephrosis. Simple appearing inferior pole right kidney cyst measures 2.7 cm. Stomach/Bowel: Visualized portions within the abdomen are unremarkable. Vascular/Lymphatic: No evidence for abdominal aortic aneurysm. Esophageal and splenic varices as well as portosystemic venous collaterals compatible with portal venous hypertension. No adenopathy identified. Other:  Interval increase in volume of abdominal ascites. Musculoskeletal: No suspicious bone lesions identified. IMPRESSION: 1. There is been mild increase in size of mass involving head of pancreas. 2. Diffuse hepatic metastasis. The previous index lesions are unchanged in size compared with previous exam. A single new metastasis is identified within the medial segment of left lobe of liver. 3. 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 Abdominal ascites has increased in the interval. Electronically Signed   By: Kerby Moors M.D.   On: 12/04/2018 11:27   Ir Removal [employer] W/ [place] W/o Fl Mod Sed  Result Date: 12/27/2018 CLINICAL DATA:  Bacteremia. Indwelling right IJ port catheter placed 08/30/2017 by Dr. Pascal Lux, has been working well without evident complication , removal requested EXAM: EXAM TUNNELED PORT CATHETER REMOVAL TECHNIQUE: The procedure, risks (including but not limited to bleeding, infection, organ damage ), benefits, and alternatives were explained to the patient. Questions regarding the procedure were encouraged and answered. The patient understands and consents to the procedure. Intravenous Fentanyl 138mg and Versed 437mwere administered as conscious sedation during continuous monitoring of the patient's level of consciousness and physiological / cardiorespiratory status by the radiology RN, with a total moderate sedation time of 37 minutes. Overlying skin prepped with chlorhexidine, draped in usual sterile fashion,  infiltrated locally with 1% lidocaine. A small incision was made over the scar from previous placement. The port catheter was dissected free from the underlying soft tissues and removed intact. There is no gross purulence. Hemostasis was achieved. The port pocket was closed with deep interrupted and subcuticular continuous 3-0 Monocryl sutures, then covered with Dermabond. The patient tolerated the procedure well. COMPLICATIONS: COMPLICATIONS None immediate IMPRESSION: 1.  Technically successful tunneled Port catheter removal. Electronically Signed   By: D Lucrezia Europe.D.   On: 12/27/2018 16:14   Dg Chest Port 1 View  Result Date: 12/28/2018 CLINICAL DATA:  Sepsis hypertension EXAM: PORTABLE CHEST 1 VIEW COMPARISON:  12/28/2018, 12/24/2018 FINDINGS: Right upper extremity catheter tip overlies the SVC. No acute consolidation or effusion. Normal cardiomediastinal silhouette. No pneumothorax. IMPRESSION: No active disease. Electronically Signed   By: KiDonavan Foil.D.   On: 12/28/2018 23:03   Dg Chest Portable 1 View  Result Date: 12/24/2018 CLINICAL DATA:  Fever and weakness. EXAM: PORTABLE CHEST 1 VIEW COMPARISON:  11/04/2018 FINDINGS: The cardiac silhouette, mediastinal and hilar contours are stable. Mild cardiac enlargement and mild tortuosity of the thoracic aorta. The right IJ power port is stable. The lungs are clear. No pleural effusions. The bony thorax is intact. IMPRESSION: No acute cardiopulmonary findings. Electronically Signed   By: P.Marijo Sanes.D.   On: 12/24/2018 13:59   Dg Ercp  Result Date:  12/06/2018 CLINICAL DATA:  63 year old male with a history of pancreatic cancer and obstructed jaundice with prior stent placement and progressive liver enzyme elevation. EXAM: ERCP TECHNIQUE: Multiple spot images obtained with the fluoroscopic device and submitted for interpretation post-procedure. FLUOROSCOPY TIME:  Fluoroscopy Time:  3 minutes 43 seconds Radiation Exposure Index (if provided by the  fluoroscopic device): 66.7 mGy COMPARISON:  MRCP 12/04/2018 FINDINGS: A total of 4 intraoperative saved images are submitted for review. The images demonstrate a metal stent in the common hepatic and common bile ducts with a flexible endoscope in the descending duodenum. Subsequent images demonstrate wire cannulation of the stent and balloon dilation. IMPRESSION: ERCP with cannulation of the previously placed metal stent and balloon dilatation. These images were submitted for radiologic interpretation only. Please see the procedural report for the amount of contrast and the fluoroscopy time utilized. Electronically Signed   By: Jacqulynn Cadet M.D.   On: 12/06/2018 11:51   [place] Ascites (abdomen Limited)  Result Date: 12/25/2018 CLINICAL DATA:  63 year old male with abdominal distension, ascites EXAM: LIMITED ABDOMEN ULTRASOUND FOR ASCITES TECHNIQUE: Limited ultrasound survey for ascites was performed in all four abdominal quadrants. COMPARISON:  Prior CT scan of the abdomen and pelvis 12/24/2018 FINDINGS: Sonographic interrogation was performed of the 4 quadrants of the abdomen. There is trace perihepatic fluid in the right upper quadrant with multiple small loops of bowel. Overall, fluid volume is low. No safe window for paracentesis. IMPRESSION: Small volume ascites, insufficient for paracentesis. Electronically Signed   By: Jacqulynn Cadet M.D.   On: 12/25/2018 12:13   Ir Picc Placement Right >5 Yrs Inc Img Guide  Result Date: 12/27/2018 CLINICAL DATA:  Bacteremia, needs venous access for planned chemotherapy Monday EXAM: PICC PLACEMENT WITH ULTRASOUND AND FLUOROSCOPY FLUOROSCOPY TIME:  6 seconds, 1 mGy TECHNIQUE: After written informed consent was obtained, patient was placed in the supine position on angiographic table. Patency of the right basilic vein was confirmed with ultrasound with image documentation. An appropriate skin site was determined. Skin site was marked. Region was prepped using  maximum barrier technique including cap and mask, sterile gown, sterile gloves, large sterile sheet, and Chlorhexidine as cutaneous antisepsis. The region was infiltrated locally with 1% lidocaine. Under real-time ultrasound guidance, the right basilic vein was accessed with a 21 gauge micropuncture needle; the needle tip within the vein was confirmed with ultrasound image documentation. Needle exchanged over a 018 guidewire for a peel-away sheath, through which a 5-French single-lumen power injectable PICC trimmed to 46cm was advanced, positioned with its tip near the cavoatrial junction. Spot chest radiograph confirms appropriate catheter position. Catheter was flushed per protocol and secured externally. The patient tolerated procedure well. COMPLICATIONS: COMPLICATIONS none IMPRESSION: 1. Technically successful five [place] single lumen power injectable PICC placement Electronically Signed   By: Lucrezia Europe M.D.   On: 12/27/2018 16:15    ASSESSMENT AND PLAN: 1. Pancreas head mass  CT abdomen/pelvis 08/03/2017-fullness in the head of the pancreas with stranding in the peripancreatic fat and pancreatic ductal dilatation.   MRI abdomen 08/04/2017-findings favored to represent acute on chronic pancreatitis; equivocal soft tissue fullness within the head and uncinate process of the pancreas; indeterminate too small to characterize right hepatic lobe lesion; small volume abdominal ascites; splenomegaly.   CA-19-9 elevated at 977on 08/04/2017.  Status postupper EUS 08/10/2017-findings of an irregular masslike region in the pancreatic head measuring approximately 2.7 cm. This directly abutted the main portal vein but no other major vascular structures. Biopsies obtained with preliminary  cytology positive for malignancy, likely adenocarcinoma. The final report is pending. The common bile duct and main pancreatic duct were both dilated. ERCP was then proceeded with forstent placement. Multiple attempts were  made tocannulate the bile duct without success. The procedure was aborted.  08/16/2017 ERCP with placement of a metal biliary stent in the common bile duct by Dr. Francella Solian at [facility].  Cycle 1 FOLFIRINOX 08/31/2017  Cycle 2 FOLFIRINOX 09/14/2017  Cycle 3 FOLFIRINOX 09/28/2017  Cycle 4 FOLFIRINOX 10/12/2017 (oxaliplatin dose reduced secondary to thrombocytopenia)  Cycle 5 FOLFIRINOX 10/26/2017  Cycle 6 FOLFIRINOX 11/09/2017  [facility] at [facility] 11/29/2017-no definitive evidence of distant metastatic disease. 2 subcentimeter liver lesions described on prior MRI not visualized on CT. Ill-defined pancreatic head mass stable to decreased in size measuring 2.1 x 2 cm. Peripancreatic inflammatory stranding decreased from prior. No biliary ductal dilatation. Celiac axis less than 180 degrees abutment. Common hepatic artery with greater than 180 degrees encasement; superior mesenteric artery with short segment less than 180 degrees abutment; portal vein/superior mesenteric vein with greater than 180 degrees encasement of the extrahepatic portal vein with associated circumferential narrowing at the portomesenteric, overall substantially improved from prior examination. Now less than 180 degree abutment of the SMV. The portal vein and SMV remain patent. Splenic vein patent.  Cycle 1 gemcitabine/Abraxane 12/06/2017  Cycle 2 gemcitabine/Abraxane 12/20/2017  Cycle 3 gemcitabine/Abraxane 01/03/2018  Cycle 4 gemcitabine/Abraxane 01/17/2018  Cycle 5 gemcitabine 01/31/2018 (Abraxane held due to neuropathy)  Cycle 6 gemcitabine 02/14/2018 (Abraxane held due to neuropathy)  Cycle 7 gemcitabine 02/28/2018 (Abraxane held due to neuropathy)  CT chest/abdomen/pelvis at [facility] 03/08/2018-stable appearance of previously identified infiltrating pancreatic head mass. No CT evidence of metastatic disease.  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 Ill-defined pancreatic head mass not felt to be significantly changed.  CT chest/abdomen/pelvis 05/10/2018-ill-defined hypoattenuating pancreatic head mass and ill-defined soft tissue abutting the celiac axis with mildly increased dilatation of the main pancreatic duct. New soft tissue nodule along the right upper lobe bronchus; hypoattenuating liver lesions concerning for metastasis.  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 The stent traverses the pancreatic mass and extends into the duodenum. Increase in size of 2 liver lesions. Pancreatic mass appears similar. Indistinct tissue planes around the duodenum along with wall thickening in the gallbladder and especially the gallbladder neck.  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 Diffuse hepatic metastasis. Previous index lesions unchanged in size. Single new metastasis identified within the medial segment left lobe of liver. 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 Abdominal ascites increased in the interval.  Cycle 7 FOLFIRINOX6/02/2019 2. Hypertension 3. Port-A-Cath placement 08/30/2017 4. History of elevated bilirubin-questionGilbert's syndrome 5. Mild leukopenia, thrombocytopenia-question secondary to portal hypertension/splenomegaly;09/14/2017 white count improved, platelet count stable;progressive thrombocytopenia following gemcitabine/Abraxane 6. Kidney stones 7. Fever following cycles 2 and 3 gemcitabine/Abraxane,? Fever related to gemcitabine 8.Jaundice, elevated liver enzymes and bilirubin 06/22/2018; status post ERCP 06/25/2018 with debris removed from the existing stent and a new plastic stent placed. Recurrent jaundice 07/19/2018 status post ERCP 07/20/2018 with finding that the previously placed  plastic stent had migrated distally. There was debris and sludge on the stent. The previously placed stent was removed. 5 to 8 mm stricture just proximal to the existing SEMS. 2 plastic stents were placed.ERCP 08/16/2018-2 plastic biliary stents were removed from within the previously placed partially covered SEMS; stricture confirmed just proximal to the previously placed SEMS, treated with placement of an 8 cm long 10 mm diameter uncovered SEMS in good position. 9.Hospitalized with bacteremia Streptococcus group C 11/04/2018 through 11/08/2018. IV ceftriaxone through 11/17/2018. TEE 11/08/2018 negative. 10.Elevated LFTs 12/03/2018-MRI abdomen 12/04/2018 with mild increase in size of mass involving head of the pancreas. Diffuse hepatic metastasis. Previous index lesions unchanged in size. Single new metastasis identified within the medial segment left lobe of liver. 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 Abdominal ascites increased in the interval. 12/06/2018 ERCP-previously placed uncovered 8 cm long metal biliary stent nearly filled with bio debris and flecks of tissue ingrowth. Stent cleared. 11. 12/24/18 -admission with sepsis syndrome, blood culture from right arm positive for Streptococcus Anginosis, blood culture from Port-A-Cath with a Bacteroides species 12.  12/28/2018-readmission with sepsis syndrome  ASSESSMENT/PLAN: Mr. Gopal is readmitted with sepsis syndrome.  He presented with a fever up to 103 and rigors.  These have resolved since admission.  Blood cultures from prior admission were positive for Streptococcus anginosis and Bacteroides thetaiotaomicron.  The cath was removed last admission.  Blood cultures this admission are negative to date.  The patient also has ascites.  He has mild pancytopenia which is stable and likely due to his recent chemotherapy.  1.  Continue IV antibiotics.  Follow blood cultures. 2.  Recommend infectious disease  consult. 3.  We will request ultrasound-guided paracentesis today for ascites.  Fluid will be sent for cytology, Gram stain, and culture. 4.  Monitor CBC.  No transfusion is indicated at this time.   LOS: 3 days   Mikey Bussing, DNP, AGPCNP-BC, AOCNP 12/31/18   Mr. Whipp was readmitted with a high fever and chills 24 hours after discharge.  Cultures from  admission are negative to date.  The gram-negative rod from the 12/24/2018 Port-A-Cath culture has been identified as a Bacteroides species.  The Port-A-Cath was removed on 12/27/2018.  It appears unlikely the Port-A-Cath is the source for infection.  I have suspected a biliary source of infection, potentially stent related, but his liver enzymes have improved significantly.  It is possible the recurrent polymicrobial sepsis is related to transudation from tumor involving the GI tract.  It is also possible he has infected peritoneal fluid.  I recommend a repeat attempt at a paracentesis to obtain fluid for cytology and culture.  Mr. Willmore had a negative TEE last month.  I think there is a low yield from a repeat TEE.  I feel Mr. Luallen will benefit from an ID consult.  Chemotherapy will be placed on hold.

## 2018-12-31 NOTE — Progress Notes (Signed)
PROGRESS NOTE    Cory Kelly  OZD:[id number] DOB: 11-Oct-1955 DOA: 12/28/2018 PCP: Mancel Bale, PA-C   Brief Narrative:  63 year old with history of metastatic pancreatic cancer status post biliary stent, essential hypertension, hyperlipidemia returned back to the hospital with fevers and chills.  He was recently discharged here from the hospital after being treated for gram-negative and Streptococcus bacteremia with IV Rocephin.   Assessment & Plan:   Principal Problem:   Sepsis ([facility]) Active Problems:   Essential hypertension   Hyperlipemia   Pancreatic cancer (HCC)   Mass of pancreas   Fever, unspecified   Bacteremia due to Gram-negative bacteria  Sepsis, present on admission Streptococcus bacteremia and gram-negative rod bacteremia, unclear source -Continue IV vancomycin cefepime and Flagyl.  IV fluids. -Repeat cultures done-follow-up data- NGTD - Previously his port was removed and PICC line was placed.  Previous culture data reviewed. -I also discussed case extensively with gastroenterology, Dr. Lyndel Safe, Dr. Johnnye Sima from infectious disease over the weekend. -Echo- normal, neg for endocarditis -There is still question about requiring TEE, will get infectious disease formal evaluation.  Dr. Megan Salon from [facility] consulted.  Will see the patient today.  Normocytic anemia, iron deficiency -Also from chronic disease.  Iron supplements with bowel regimen.  Hypokalemia magnesium -Repletion ordered.  History of pancreatic cancer status post metastases -Per oncology team.  Large volume stool, constipation -Bowel regimen  Nonobstructive bilateral kidney stones -IV fluids,Flomax stopped at patient's request.  Peripheral neuropathy -On home gabapentin.  Essential hypertension -Resume home meds.  DVT prophylaxis: Lovenox Code Status: Full code Family Communication: None at bedside Disposition Plan: Continue hospital stay for IV antibiotics and evaluation by  infectious disease.  Consultants:   Curbside-gastroenterology, infectious disease.  Also spoke with Dr. Benay Spice yesterday  Procedures:   None  Antimicrobials:   Vanc D4  Cefepime D4  Flagyl D4   Subjective: Sitting up in the chair, no complaints.  Remains afebrile overnight.  Review of Systems Otherwise negative except as per HPI, including: General = no fevers, chills, dizziness, malaise, fatigue HEENT/EYES = negative for pain, redness, loss of vision, double vision, blurred vision, loss of hearing, sore throat, hoarseness, dysphagia Cardiovascular= negative for chest pain, palpitation, murmurs, lower extremity swelling Respiratory/lungs= negative for shortness of breath, cough, hemoptysis, wheezing, mucus production Gastrointestinal= negative for nausea, vomiting,, abdominal pain, melena, hematemesis Genitourinary= negative for Dysuria, Hematuria, Change in Urinary Frequency MSK = Negative for arthralgia, myalgias, Back Pain, Joint swelling  Neurology= Negative for headache, seizures, numbness, tingling  Psychiatry= Negative for anxiety, depression, suicidal and homocidal ideation Allergy/Immunology= Medication/Food allergy as listed  Skin= Negative for Rash, lesions, ulcers, itching   Objective: Vitals:   12/29/18 2124 12/30/18 1317 12/30/18 2058 12/31/18 0510  BP: 127/82 128/81 127/85 132/87  Pulse: 64 61 (!) 56 62  Resp: 18 16  20   Temp: 98.7 F (37.1 C) 98.7 F (37.1 C) 98.6 F (37 C) 97.9 F (36.6 C)  TempSrc: Oral Oral Oral Oral  SpO2: 97% 100% 98% 97%  Weight:      Height:        Intake/Output Summary (Last 24 hours) at 12/31/2018 1030 Last data filed at 12/31/2018 0700 Gross per 24 hour  Intake 3030.29 ml  Output -  Net 3030.29 ml   Filed Weights   12/28/18 1703  Weight: 88.6 kg    Examination:  Constitutional: NAD, calm, comfortable Eyes: PERRL, lids and conjunctivae normal ENMT: Mucous membranes are moist. Posterior pharynx clear of any  exudate or lesions.Normal dentition.  Neck: normal, supple, no masses, no thyromegaly Respiratory: clear to auscultation bilaterally, no wheezing, no crackles. Normal respiratory effort. No accessory muscle use.  Cardiovascular: Regular rate and rhythm, no murmurs / rubs / gallops. No extremity edema. 2+ pedal pulses. No carotid bruits.  Abdomen: no tenderness, no masses palpated. No hepatosplenomegaly. Bowel sounds positive.  Musculoskeletal: no clubbing / cyanosis. No joint deformity upper and lower extremities. Good ROM, no contractures. Normal muscle tone.  Skin: no rashes, lesions, ulcers. No induration Neurologic: CN 2-12 grossly intact. Sensation intact, DTR normal. Strength 5/5 in all 4.  Psychiatric: Normal judgment and insight. Alert and oriented x 3. Normal mood.    PICC RUE since 6/25   Data Reviewed:   CBC: Recent Labs  Lab 12/24/18 1123 12/25/18 0725  12/27/18 6789 12/28/18 1750 12/29/18 0250 12/30/18 0429 12/31/18 0327  WBC 9.3 2.3*   < > 2.2* 5.7 3.5* 2.3* 2.4*  NEUTROABS 8.2* 1.6*  --  1.7 5.3  --   --   --   HGB 12.0* 9.2*   < > 9.2* 9.3* 7.8* 8.0* 8.5*  HCT 37.0* 28.4*   < > 27.8* 27.7* 23.8* 25.3* 26.8*  MCV 87.5 87.9   < > 86.1 85.0 86.2 86.9 86.7  PLT 169 60*   < > 90* 97* 86* 100* 124*   < > = values in this interval not displayed.   Basic Metabolic Panel: Recent Labs  Lab 12/26/18 0555 12/27/18 0652 12/28/18 1750 12/28/18 2334 12/29/18 0250 12/30/18 0429 12/31/18 0327  NA 133* 135 132*  --  133* 135 135  K 3.2* 3.4* 3.1*  --  3.2* 3.3* 3.6  CL 102 105 99  --  104 106 106  CO2 26 23 24   --  23 22 23   GLUCOSE 112* 114* 120*  --  134* 124* 110*  BUN 6* 5* 9  --  9 7* 5*  CREATININE 0.72 0.68 0.88  --  0.78 0.61 0.63  CALCIUM 7.8* 7.7* 7.8*  --  7.5* 7.5* 7.8*  MG 1.6* 2.0  --  1.5*  --  1.9 1.8  PHOS  --   --   --  2.8  --   --   --    GFR: Estimated Creatinine Clearance: 121.5 mL/min (by C-G formula based on SCr of 0.63 mg/dL). Liver  Function Tests: Recent Labs  Lab 12/24/18 1205 12/27/18 0652 12/28/18 1750 12/30/18 0429 12/31/18 0327  AST 40 28 35 45* 37  ALT 73* 56* 51* 48* 46*  ALKPHOS 754* 531* 525* 477* 522*  BILITOT 0.8 0.4 0.7 0.4 0.5  PROT 6.3* 4.9* 5.0* 4.5* 4.8*  ALBUMIN 3.0* 2.5* 2.4* 2.1* 2.3*   Recent Labs  Lab 12/24/18 1328  LIPASE 24   No results for input(s): AMMONIA in the last 168 hours. Coagulation Profile: Recent Labs  Lab 12/28/18 1750 12/28/18 2334  INR 1.2 1.2   Cardiac Enzymes: No results for input(s): CKTOTAL, CKMB, CKMBINDEX, TROPONINI in the last 168 hours. BNP (last 3 results) No results for input(s): PROBNP in the last 8760 hours. HbA1C: No results for input(s): HGBA1C in the last 72 hours. CBG: No results for input(s): GLUCAP in the last 168 hours. Lipid Profile: No results for input(s): CHOL, HDL, LDLCALC, TRIG, CHOLHDL, LDLDIRECT in the last 72 hours. Thyroid Function Tests: No results for input(s): TSH, T4TOTAL, FREET4, T3FREE, THYROIDAB in the last 72 hours. Anemia Panel: Recent Labs    12/28/18 2334  VITAMINB12 1,219*  FERRITIN 353*  TIBC 168*  IRON 16*   Sepsis Labs: Recent Labs  Lab 12/24/18 1328 12/26/18 0555 12/27/18 0652 12/28/18 1708 12/28/18 2334 12/29/18 0250 12/31/18 0327  PROCALCITON  --  42.75 23.49  --  33.26  --  8.88  LATICACIDVEN 1.7  --   --  1.0 0.6 0.6  --     Recent Results (from the past 240 hour(s))  Culture, Blood     Status: Abnormal   Collection Time: 12/24/18 11:25 AM   Specimen: BLOOD RIGHT ARM  Result Value Ref Range Status   Specimen Description   Final    BLOOD RIGHT ARM Performed at [facility], [place] [address], [place], [place] [address]    Special Requests   Final    BOTTLES DRAWN AEROBIC AND ANAEROBIC Blood Culture adequate volume   Culture  Setup Time   Final    AEROBIC BOTTLE ONLY GRAM POSITIVE COCCI CRITICAL RESULT CALLED TO, READ BACK BY AND VERIFIED WITH: J LEGGE PHARMD  12/25/18 2030 JDW GRAM VARIABLE ROD ANAEROBIC BOTTLE ONLY CRITICAL RESULT CALLED TO, READ BACK BY AND VERIFIED WITH: Damian Leavell [id number] Performed at [facility], [place] [address], [place], [place] [address]    Culture STREPTOCOCCUS ANGINOSIS (A)  Final   Report Status 12/28/2018 FINAL  Final   Organism ID, Bacteria STREPTOCOCCUS ANGINOSIS  Final      Susceptibility   Streptococcus anginosis - MIC*    PENICILLIN 0.12 SENSITIVE Sensitive     CEFTRIAXONE 0.5 SENSITIVE Sensitive     ERYTHROMYCIN <=0.12 SENSITIVE Sensitive     LEVOFLOXACIN <=0.25 SENSITIVE Sensitive     VANCOMYCIN 0.5 SENSITIVE Sensitive     * STREPTOCOCCUS ANGINOSIS  Blood Culture ID Panel (Reflexed)     Status: Abnormal   Collection Time: 12/24/18 11:25 AM  Result Value Ref Range Status   Enterococcus species NOT DETECTED NOT DETECTED Final   Listeria monocytogenes NOT DETECTED NOT DETECTED Final   Staphylococcus species NOT DETECTED NOT DETECTED Final   Staphylococcus aureus (BCID) NOT DETECTED NOT DETECTED Final   Streptococcus species DETECTED (A) NOT DETECTED Final    Comment: Not Enterococcus species, Streptococcus agalactiae, Streptococcus pyogenes, or Streptococcus pneumoniae. CRITICAL RESULT CALLED TO, READ BACK BY AND VERIFIED WITH: J LEGGE PHARMD 12/25/18 2030 JDW    Streptococcus agalactiae NOT DETECTED NOT DETECTED Final   Streptococcus pneumoniae NOT DETECTED NOT DETECTED Final   Streptococcus pyogenes NOT DETECTED NOT DETECTED Final   Acinetobacter baumannii NOT DETECTED NOT DETECTED Final   Enterobacteriaceae species NOT DETECTED NOT DETECTED Final   Enterobacter cloacae complex NOT DETECTED NOT DETECTED Final   Escherichia coli NOT DETECTED NOT DETECTED Final   Klebsiella oxytoca NOT DETECTED NOT DETECTED Final   Klebsiella pneumoniae NOT DETECTED NOT DETECTED Final   Proteus species NOT DETECTED NOT DETECTED Final   Serratia marcescens NOT DETECTED NOT DETECTED Final   Haemophilus  influenzae NOT DETECTED NOT DETECTED Final   Neisseria meningitidis NOT DETECTED NOT DETECTED Final   Pseudomonas aeruginosa NOT DETECTED NOT DETECTED Final   Candida albicans NOT DETECTED NOT DETECTED Final   Candida glabrata NOT DETECTED NOT DETECTED Final   Candida krusei NOT DETECTED NOT DETECTED Final   Candida parapsilosis NOT DETECTED NOT DETECTED Final   Candida tropicalis NOT DETECTED NOT DETECTED Final    Comment: Performed at [facility], [place] [address] [place], [place] [address]  Culture, Blood     Status: Abnormal (Preliminary result)  Collection Time: 12/24/18 12:09 PM   Specimen: Porta Cath; Blood  Result Value Ref Range Status   Specimen Description   Final    PORTA CATH Performed at [facility], [place] [address] [place], [place] [address]    Special Requests   Final    NONE Performed at [facility], [place] [address] [place], [place] [address]    Culture  Setup Time   Final    GRAM NEGATIVE RODS ANAEROBIC BOTTLE ONLY CRITICAL RESULT CALLED TO, READ BACK BY AND VERIFIED WITH: Sheffield Slider PharmD 12:00 12/27/18 ([email or web address]) Performed at [facility], [place] [address], [place], [place] [address]    Culture BACTEROIDES THETAIOTAOMICRON (A)  Final   Report Status PENDING  Incomplete  Blood Culture ID Panel (Reflexed)     Status: None   Collection Time: 12/24/18 12:09 PM  Result Value Ref Range Status   Enterococcus species NOT DETECTED NOT DETECTED Final   Listeria monocytogenes NOT DETECTED NOT DETECTED Final   Staphylococcus species NOT DETECTED NOT DETECTED Final   Staphylococcus aureus (BCID) NOT DETECTED NOT DETECTED Final   Streptococcus species NOT DETECTED NOT DETECTED Final   Streptococcus agalactiae NOT DETECTED NOT DETECTED Final   Streptococcus pneumoniae NOT DETECTED NOT DETECTED Final   Streptococcus pyogenes NOT DETECTED NOT DETECTED Final   Acinetobacter baumannii NOT DETECTED NOT DETECTED  Final   Enterobacteriaceae species NOT DETECTED NOT DETECTED Final   Enterobacter cloacae complex NOT DETECTED NOT DETECTED Final   Escherichia coli NOT DETECTED NOT DETECTED Final   Klebsiella oxytoca NOT DETECTED NOT DETECTED Final   Klebsiella pneumoniae NOT DETECTED NOT DETECTED Final   Proteus species NOT DETECTED NOT DETECTED Final   Serratia marcescens NOT DETECTED NOT DETECTED Final   Haemophilus influenzae NOT DETECTED NOT DETECTED Final   Neisseria meningitidis NOT DETECTED NOT DETECTED Final   Pseudomonas aeruginosa NOT DETECTED NOT DETECTED Final   Candida albicans NOT DETECTED NOT DETECTED Final   Candida glabrata NOT DETECTED NOT DETECTED Final   Candida krusei NOT DETECTED NOT DETECTED Final   Candida parapsilosis NOT DETECTED NOT DETECTED Final   Candida tropicalis NOT DETECTED NOT DETECTED Final    Comment: Performed at [facility], [place] [address], [place], [place] [address]  Blood Culture ID Panel (Reflexed)     Status: None   Collection Time: 12/24/18 12:27 PM  Result Value Ref Range Status   Enterococcus species NOT DETECTED NOT DETECTED Final   Listeria monocytogenes NOT DETECTED NOT DETECTED Final   Staphylococcus species NOT DETECTED NOT DETECTED Final   Staphylococcus aureus (BCID) NOT DETECTED NOT DETECTED Final   Streptococcus species NOT DETECTED NOT DETECTED Final   Streptococcus agalactiae NOT DETECTED NOT DETECTED Final   Streptococcus pneumoniae NOT DETECTED NOT DETECTED Final   Streptococcus pyogenes NOT DETECTED NOT DETECTED Final   Acinetobacter baumannii NOT DETECTED NOT DETECTED Final   Enterobacteriaceae species NOT DETECTED NOT DETECTED Final   Enterobacter cloacae complex NOT DETECTED NOT DETECTED Final   Escherichia coli NOT DETECTED NOT DETECTED Final   Klebsiella oxytoca NOT DETECTED NOT DETECTED Final   Klebsiella pneumoniae NOT DETECTED NOT DETECTED Final   Proteus species NOT DETECTED NOT DETECTED Final   Serratia marcescens NOT  DETECTED NOT DETECTED Final   Haemophilus influenzae NOT DETECTED NOT DETECTED Final   Neisseria meningitidis NOT DETECTED NOT DETECTED Final   Pseudomonas aeruginosa NOT DETECTED NOT DETECTED Final   Candida albicans NOT DETECTED NOT  DETECTED Final   Candida glabrata NOT DETECTED NOT DETECTED Final   Candida krusei NOT DETECTED NOT DETECTED Final   Candida parapsilosis NOT DETECTED NOT DETECTED Final   Candida tropicalis NOT DETECTED NOT DETECTED Final    Comment: Performed at [facility], [place] [address], [place], [place] [address]  Urine culture     Status: None   Collection Time: 12/24/18  1:28 PM   Specimen: Urine, Clean Catch  Result Value Ref Range Status   Specimen Description   Final    URINE, CLEAN CATCH Performed at [facility], [place] [address], [place], [place] [address]    Special Requests   Final    NONE Performed at [facility], [place] [address], [place], [place] [address]    Culture   Final    NO GROWTH Performed at [facility], [place] [address], [place], [place] [address]    Report Status 12/25/2018 FINAL  Final  SARS Coronavirus 2 (CEPHEID - Performed in [place] hospital lab), Hosp Order     Status: None   Collection Time: 12/24/18  1:28 PM   Specimen: Nasopharyngeal Swab  Result Value Ref Range Status   SARS Coronavirus 2 NEGATIVE NEGATIVE Final    Comment: (NOTE) If result is NEGATIVE SARS-CoV-2 target nucleic acids are NOT DETECTED. The SARS-CoV-2 RNA is generally detectable in upper and lower  respiratory specimens during the acute phase of infection. The lowest  concentration of SARS-CoV-2 viral copies this assay can detect is 250  copies / mL. A negative result does not preclude SARS-CoV-2 infection  and should not be used as the sole basis for treatment or other  patient management decisions.  A negative result may occur with  improper specimen collection / handling, submission of specimen other   than nasopharyngeal swab, presence of viral mutation(s) within the  areas targeted by this assay, and inadequate number of viral copies  (<250 copies / mL). A negative result must be combined with clinical  observations, patient history, and epidemiological information. If result is POSITIVE SARS-CoV-2 target nucleic acids are DETECTED. The SARS-CoV-2 RNA is generally detectable in upper and lower  respiratory specimens dur ing the acute phase of infection.  Positive  results are indicative of active infection with SARS-CoV-2.  Clinical  correlation with patient history and other diagnostic information is  necessary to determine patient infection status.  Positive results do  not rule out bacterial infection or co-infection with other viruses. If result is PRESUMPTIVE POSTIVE SARS-CoV-2 nucleic acids MAY BE PRESENT.   A presumptive positive result was obtained on the submitted specimen  and confirmed on repeat testing.  While 2019 novel coronavirus  (SARS-CoV-2) nucleic acids may be present in the submitted sample  additional confirmatory testing may be necessary for epidemiological  and / or clinical management purposes  to differentiate between  SARS-CoV-2 and other Sarbecovirus currently known to infect humans.  If clinically indicated additional testing with an alternate test  methodology [phone]) is advised. The SARS-CoV-2 RNA is generally  detectable in upper and lower respiratory sp ecimens during the acute  phase of infection. The expected result is Negative. Fact Sheet for Patients:  StrictlyIdeas.no Fact Sheet for Healthcare Providers: BankingDealers.co.za This test is not yet approved or cleared by the [place] FDA and has been authorized for detection and/or diagnosis of SARS-CoV-2 by FDA under an Emergency Use Authorization (EUA).  This EUA will remain in effect (meaning this test can  be used) for the duration of the  COVID-19 declaration under Section 564(b)(1) of the Act, 21 U.S.C. section 360bbb-3(b)(1), unless the authorization is terminated or revoked sooner. Performed at [facility], [place] [address], [place], [place] [address]   Culture, blood (routine x 2)     Status: None   Collection Time: 12/24/18  5:22 PM   Specimen: BLOOD  Result Value Ref Range Status   Specimen Description   Final    BLOOD LEFT ANTECUBITAL Performed at [facility], [place] [address], [place], [place] [address]    Special Requests   Final    BOTTLES DRAWN AEROBIC AND ANAEROBIC Blood Culture results may not be optimal due to an excessive volume of blood received in culture bottles Performed at [place] [address] [place], [place] [address]    Culture   Final    NO GROWTH 5 DAYS Performed at [facility], [place] [address], [place], [place] [address]    Report Status 12/29/2018 FINAL  Final  Culture, blood (routine x 2)     Status: None   Collection Time: 12/24/18  5:29 PM   Specimen: BLOOD  Result Value Ref Range Status   Specimen Description   Final    BLOOD RIGHT ANTECUBITAL Performed at [facility], [place] [address] [place], [place] [address]    Special Requests   Final    BOTTLES DRAWN AEROBIC AND ANAEROBIC Blood Culture adequate volume Performed at [place] [address], [place], [place] [address]    Culture   Final    NO GROWTH 5 DAYS Performed at [facility], [place] [address], [place], [place] [address]    Report Status 12/29/2018 FINAL  Final  Culture, blood (Routine x 2)     Status: None (Preliminary result)   Collection Time: 12/28/18  5:50 PM   Specimen: BLOOD LEFT FOREARM  Result Value Ref Range Status   Specimen Description   Final    BLOOD LEFT FOREARM Performed at [facility], [place] [address], [place], [place] [address]    Special Requests   Final    BOTTLES DRAWN AEROBIC AND ANAEROBIC Blood  Culture adequate volume Performed at [place] [address], [place], [place] [address]    Culture   Final    NO GROWTH 2 DAYS Performed at [place] [address] [place], [place] [address]    Report Status PENDING  Incomplete  Culture, blood (Routine x 2)     Status: None (Preliminary result)   Collection Time: 12/28/18  5:50 PM   Specimen: BLOOD  Result Value Ref Range Status   Specimen Description   Final    BLOOD RIGHT MIDLINE Performed at [place] [address], [place], [place] [address]    Special Requests   Final    BOTTLES DRAWN AEROBIC AND ANAEROBIC Blood Culture adequate volume Performed at [facility] [address] [place], [place] [address]    Culture   Final    NO GROWTH 2 DAYS Performed at [place] [address], [place], [place] [address]    Report Status PENDING  Incomplete  SARS Coronavirus 2 (CEPHEID - Performed in [place] hospital lab), Hosp Order     Status: None   Collection Time: 12/28/18  5:52 PM   Specimen: Nasopharyngeal Swab  Result Value Ref Range Status  SARS Coronavirus 2 NEGATIVE NEGATIVE Final    Comment: (NOTE) If result is NEGATIVE SARS-CoV-2 target nucleic acids are NOT DETECTED. The SARS-CoV-2 RNA is generally detectable in upper and lower  respiratory specimens during the acute phase of infection. The lowest  concentration of SARS-CoV-2 viral copies this assay can detect is 250  copies / mL. A negative result does not preclude SARS-CoV-2 infection  and should not be used as the sole basis for treatment or other  patient management decisions.  A negative result may occur with  improper specimen collection / handling, submission of specimen other  than nasopharyngeal swab, presence of viral mutation(s) within the  areas targeted by this assay, and inadequate number of viral copies  (<250 copies / mL). A negative result must be combined  with clinical  observations, patient history, and epidemiological information. If result is POSITIVE SARS-CoV-2 target nucleic acids are DETECTED. The SARS-CoV-2 RNA is generally detectable in upper and lower  respiratory specimens dur ing the acute phase of infection.  Positive  results are indicative of active infection with SARS-CoV-2.  Clinical  correlation with patient history and other diagnostic information is  necessary to determine patient infection status.  Positive results do  not rule out bacterial infection or co-infection with other viruses. If result is PRESUMPTIVE POSTIVE SARS-CoV-2 nucleic acids MAY BE PRESENT.   A presumptive positive result was obtained on the submitted specimen  and confirmed on repeat testing.  While 2019 novel coronavirus  (SARS-CoV-2) nucleic acids may be present in the submitted sample  additional confirmatory testing may be necessary for epidemiological  and / or clinical management purposes  to differentiate between  SARS-CoV-2 and other Sarbecovirus currently known to infect humans.  If clinically indicated additional testing with an alternate test  methodology [phone]) is advised. The SARS-CoV-2 RNA is generally  detectable in upper and lower respiratory sp ecimens during the acute  phase of infection. The expected result is Negative. Fact Sheet for Patients:  StrictlyIdeas.no Fact Sheet for Healthcare Providers: BankingDealers.co.za This test is not yet approved or cleared by the [place] FDA and has been authorized for detection and/or diagnosis of SARS-CoV-2 by FDA under an Emergency Use Authorization (EUA).  This EUA will remain in effect (meaning this test can be used) for the duration of the COVID-19 declaration under Section 564(b)(1) of the Act, 21 U.S.C. section 360bbb-3(b)(1), unless the authorization is terminated or revoked sooner. Performed at [facility], [place] [address], [place], [place] [address]          Radiology Studies: No results found.      Scheduled Meds: . amLODipine  10 mg Oral QHS  . calamine   Topical BID  . doxazosin  1 mg Oral QHS  . enoxaparin (LOVENOX) injection  40 mg Subcutaneous Daily  . ferrous sulfate  325 mg Oral BID WC  . gabapentin  200 mg Oral BID  . irbesartan  300 mg Oral Daily  . potassium chloride SA  20 mEq Oral BID   Continuous Infusions: . sodium chloride 75 mL/hr at 12/31/18 0219  . ceFEPime (MAXIPIME) IV 2 g (12/31/18 0220)  . magnesium sulfate bolus IVPB 2 g (12/31/18 0938)  . metronidazole 500 mg (12/31/18 0644)  . potassium chloride 10 mEq (12/31/18 0920)  . vancomycin 1,500 mg (12/30/18 2253)     LOS: 3 days   Time spent= 35 mins    Ankit Arsenio Loader, MD [facility]  If 7PM-7AM, please contact  night-coverage [email or web address] 12/31/2018, 10:30 AM

## 2018-12-31 NOTE — Progress Notes (Signed)
PHARMACY - PHYSICIAN COMMUNICATION CRITICAL VALUE ALERT - BLOOD CULTURE IDENTIFICATION (BCID)  Cory Kelly is an 63 y.o. male who presented to [facility] on 12/28/2018 with a chief complaint of sepsis (fever, chills).  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  Repeat blood cx from today now growing C. Glabrata.     Name of physician (or Provider) Contacted: Raliegh Ip Schorr  Current antibiotics: Invanz 1gm IV q24h  Changes to prescribed antibiotics recommended:  Add Anidulofungin 200mg  IV x1 then 100mg  IV O17R Automatic ID consult (ID already following)  Results for orders placed or performed during the hospital encounter of 12/28/18  Blood Culture ID Panel (Reflexed) (Collected: 12/28/2018  5:50 PM)  Result Value Ref Range   Enterococcus species NOT DETECTED NOT DETECTED   Listeria monocytogenes NOT DETECTED NOT DETECTED   Staphylococcus species NOT DETECTED NOT DETECTED   Staphylococcus aureus (BCID) NOT DETECTED NOT DETECTED   Streptococcus species NOT DETECTED NOT DETECTED   Streptococcus agalactiae NOT DETECTED NOT DETECTED   Streptococcus pneumoniae NOT DETECTED NOT DETECTED   Streptococcus pyogenes NOT DETECTED NOT DETECTED   Acinetobacter baumannii NOT DETECTED NOT DETECTED   Enterobacteriaceae species NOT DETECTED NOT DETECTED   Enterobacter cloacae complex NOT DETECTED NOT DETECTED   Escherichia coli NOT DETECTED NOT DETECTED   Klebsiella oxytoca NOT DETECTED NOT DETECTED   Klebsiella pneumoniae NOT DETECTED NOT DETECTED   Proteus species NOT DETECTED NOT DETECTED   Serratia marcescens NOT DETECTED NOT DETECTED   Haemophilus influenzae NOT DETECTED NOT DETECTED   Neisseria meningitidis NOT DETECTED NOT DETECTED   Pseudomonas aeruginosa NOT DETECTED NOT DETECTED   Candida albicans NOT DETECTED NOT DETECTED   Candida glabrata DETECTED (A) NOT DETECTED   Candida krusei NOT DETECTED NOT DETECTED   Candida parapsilosis NOT DETECTED NOT DETECTED   Candida tropicalis NOT DETECTED NOT DETECTED    Biagio Borg 12/31/2018  9:24 PM

## 2018-12-31 NOTE — Progress Notes (Signed)
PHARMACY CONSULT NOTE FOR:  OUTPATIENT  PARENTERAL ANTIBIOTIC THERAPY (OPAT)  Indication: Polymicrobial bacteremia Regimen: Ertapenem 1 g IV daily End date: 02/09/19  IV antibiotic discharge orders are pended. To discharging provider:  please sign these orders via discharge navigator,  Select New Orders & click on the button choice - Manage This Unsigned Work.     Thank you for allowing pharmacy to be a part of this patient's care.  Lenis Noon, PharmD 12/31/2018, 5:20 PM

## 2018-12-31 NOTE — TOC Initial Note (Signed)
Transition of Care [facility]) - Initial/Assessment Note    Patient Details  Name: Cory Kelly MRN: [id number] Date of Birth: 12/22/1955  [facility]) CM/SW Contact:    Dessa Phi, RN Phone Number: 12/31/2018, 4:11 PM  Clinical Narrative:  Readmit. Sepsis. Pancreatic Ca, full code.s/p [employer] following for iv abx.ID cons. [facility] cons for awaiting patient to call once @ home if he considers the service.                 Expected Discharge Plan: Home/Self Care Barriers to Discharge: Continued Medical Work up   Patient Goals and CMS Choice Patient states their goals for this hospitalization and ongoing recovery are:: go home      Expected Discharge Plan and Services Expected Discharge Plan: Home/Self Care In-house Referral: [facility](Patient will f/u with [facility] once @ home if he considers.) Discharge Planning Services: CM Consult   Living arrangements for the past 2 months: Single Family Home Expected Discharge Date: 01/01/19                                    Prior Living Arrangements/Services Living arrangements for the past 2 months: Single Family Home Lives with:: Spouse   Do you feel safe going back to the place where you live?: Yes               Activities of Daily Living Home Assistive Devices/Equipment: None ADL Screening (condition at time of admission) Patient's cognitive ability adequate to safely complete daily activities?: Yes Is the patient deaf or have difficulty hearing?: No Does the patient have difficulty seeing, even when wearing glasses/contacts?: No Does the patient have difficulty concentrating, remembering, or making decisions?: No Patient able to express need for assistance with ADLs?: No Does the patient have difficulty dressing or bathing?: No Independently performs ADLs?: Yes (appropriate for developmental age) Communication: Independent Dressing (OT): Independent Grooming: Independent Feeding: Independent Bathing:  Independent Toileting: Independent In/Out Bed: Independent Walks in Home: Independent Does the patient have difficulty walking or climbing stairs?: No Weakness of Legs: None Weakness of Arms/Hands: None  Permission Sought/Granted Permission sought to share information with : Case Manager Permission granted to share information with : Yes, Verbal Permission Granted              Emotional Assessment Appearance:: Appears stated age Attitude/Demeanor/Rapport: Gracious Affect (typically observed): Accepting Orientation: : Oriented to Self, Oriented to Place, Oriented to  Time, Oriented to Situation   Psych Involvement: No (comment)  Admission diagnosis:  Sepsis without acute organ dysfunction, due to unspecified organism [facility]) [A41.9] Patient Active Problem List   Diagnosis Date Noted  . Sepsis ([place]) 12/24/2018  . Bacteremia due to Gram-negative bacteria   . Fever, unspecified 11/05/2018  . Episode of transient neurologic symptoms 11/04/2018  . Biliary stent migration, initial encounter   . Pancreatic cancer ([address]) 07/20/2018  . 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 Jaundice 07/20/2018  . History of ERCP 07/20/2018  . Mass of pancreas 07/20/2018  . Goals of care, counseling/discussion 07/04/2018  . Cholangitis 06/22/2018  . Port-A-Cath in place 08/31/2017  . 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[place]) 08/21/2017  . Abnormal pancreas function test   . Pancreatic abnormality   . 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 Elevated LFTs 08/04/2017  . 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 Elevated serum creatinine 11/20/2012  . Elevated PSA 03/05/2012  . Essential hypertension 11/02/2011  .  Hyperlipemia 11/02/2011   PCP:  Mancel Bale, PA-C Pharmacy:   [place], [place] - [address] [place] [address] Phone: [phone] Fax: [phone]     Social Determinants of Health (SDOH) Interventions     Readmission Risk Interventions Readmission Risk Prevention Plan 12/31/2018  Transportation Screening Complete  Medication Review ([place]) Complete  PCP or Specialist appointment within 3-5 days of discharge Complete  HRI or Home Care Consult Patient refused  SW Recovery Care/Counseling Consult Patient refused  Palliative Care Screening Not [place] Not Applicable  Some recent data might be hidden

## 2018-12-31 NOTE — Consult Note (Signed)
[facility] for Infectious Disease    Date of Admission:  12/28/2018   Total days of antibiotics 8        Day 3 vancomycin        Day 3 cefepime        Day 3 metronidazole       Reason for Consult: Recurrent, polymicrobial bacteremia    Referring Provider: Dr. Gerlean Ren  Assessment: He has had fever and recurrent polymicrobial bacteremia.  I strongly suspect a hepatobiliary source.  When he underwent a routine abdominal MRI scan on 12/04/2018 radiology noted 1 new liver lesion and this was presumed to be a new area of metastasis.  He underwent repeat abdominal CT scan on 12/24/2018 and the report said that "no mets poorly appreciated by a single phase CT scan".  I suspect that he may have some microabscesses.  There may be some benefit to further scans but this may not change my recommendations.    The reason that that he returned promptly after recent discharge with renewed fever is likely to be at ceftriaxone does not cover Bacteroides.  He has now improved promptly after broadening of his antibiotic therapy to include gram-negative rods and anaerobes.  I will simplify his regimen to once daily ertapenem.  Plan on a 6-week course of therapy.  Plan: 1. Change antibiotics to once daily ertapenem  Diagnosis: Polymicrobial bacteremia  Culture Result: Strep and Bacteroides  No Known Allergies  OPAT Orders Discharge antibiotics: Per pharmacy protocol ertapenem  Duration: 6 weeks End Date: 02/09/2019  [facility] Care Per Protocol:  Labs weekly while on IV antibiotics: _x_ CBC with differential _x_ BMP __ CMP __ CRP __ ESR __ Vancomycin trough __ CK  __ Please pull PIC at completion of IV antibiotics _x_ Please leave PIC in place until doctor has seen patient or been notified  Fax weekly labs to [phone]  Clinic Follow Up Appt: 01/31/2019  Principal Problem:   Sepsis ([place]) Active Problems:   Fever, unspecified   Bacteroides infection   Streptococcal  bacteremia   Pancreatic cancer ([place])   Essential hypertension   Hyperlipemia   Scheduled Meds: . amLODipine  10 mg Oral QHS  . calamine   Topical BID  . doxazosin  1 mg Oral QHS  . enoxaparin (LOVENOX) injection  40 mg Subcutaneous Daily  . ferrous sulfate  325 mg Oral BID WC  . gabapentin  200 mg Oral BID  . irbesartan  300 mg Oral Daily  . lidocaine      . potassium chloride SA  20 mEq Oral BID   Continuous Infusions: . sodium chloride 75 mL/hr at 12/31/18 0219  . ceFEPime (MAXIPIME) IV 2 g (12/31/18 1323)  . metronidazole 500 mg (12/31/18 0644)  . vancomycin 1,500 mg (12/31/18 1431)   PRN Meds:.acetaminophen **OR** acetaminophen, albuterol, alum & mag hydroxide-simeth, bisacodyl, guaiFENesin-dextromethorphan, hydrALAZINE, hydrocortisone, hydrocortisone cream, ipratropium, lip balm, loratadine, magnesium citrate, Muscle Rub, ondansetron **OR** ondansetron (ZOFRAN) IV, oxyCODONE, oxyCODONE-acetaminophen **AND** oxyCODONE, phenol, polyethylene glycol, polyvinyl alcohol, prochlorperazine, senna-docusate, sodium chloride, sodium chloride flush, zolpidem  HPI: Jionni Helming is a 63 y.o. male who was diagnosed with metastatic pancreatic cancer last year.  He has been receiving chemotherapy.  He has had previous stenting of his common bile duct.  He was admitted to the hospital with fever on 11/03/2018.  1 of 2 admission blood cultures grew group C strep.  There was no evidence of endocarditis by TEE.  He was seen by my partner, Dr. Scharlene Gloss.  He recommended leaving his Port-A-Cath in place and treating with 2 weeks of IV ceftriaxone.  He felt well after the completion of his antibiotic.  He was readmitted with fever on 12/24/2018.  Blood cultures at that time grew strep anginosis and a gram-negative rod.  His Port-A-Cath was removed and a PIC was placed after repeat blood cultures were negative.  He was discharged on ceftriaxone again.  After discharge the gram-negative rod was reported to be  Bacteroides.  He was readmitted on 12/28/2018 with fever to 103.3 degrees.  His antibiotics were broadened to vancomycin, cefepime and metronidazole and he promptly defervesced.  Admission blood cultures are negative.  Other than fever and chills he has felt about the same as normal.  He has some chronic abdominal bloating but no abdominal pain.  Today he underwent paracentesis and 2.6 L of hazy light yellow ascitic fluid was removed.  No organisms were seen on Gram stain.  He is eager to go home.   Review of Systems: Review of Systems  Constitutional: Positive for chills, fever, malaise/fatigue and weight loss. Negative for diaphoresis.  HENT: Negative for congestion and sore throat.   Respiratory: Negative for cough, sputum production and shortness of breath.   Cardiovascular: Negative for chest pain.  Gastrointestinal: Negative for abdominal pain, diarrhea, nausea and vomiting.  Genitourinary: Negative for dysuria.       He has some intermittent urinary hesitancy.  Musculoskeletal: Negative for back pain, joint pain and myalgias.  Skin: Negative for rash.  Neurological: Negative for headaches.    Past Medical History:  Diagnosis Date  . Abdominal pain    due to bloating  . Bloating   . Cancer ([place])    skin cancer  . Essential hypertension   . Fatigue   . History of kidney stones    passed  . History of weight loss   . HOH (hard of hearing)    no hearing aids  . Neuralgia   . Pancreatic cancer ([place])   . Stress    loss of father in Dec 2018.    Social History   Tobacco Use  . Smoking status: Never Smoker  . Smokeless tobacco: Never Used  Substance Use Topics  . Alcohol use: No  . Drug use: No    Family History  Problem Relation Age of Onset  . Hyperlipidemia Father   . Heart disease Father   . Dementia Father   . Memory loss Mother   . Colon cancer Neg Hx   . Esophageal cancer Neg Hx   . Inflammatory bowel disease Neg Hx   . Liver disease Neg Hx   . Rectal  cancer Neg Hx   . Stomach cancer Neg Hx   . Pancreatic cancer Neg Hx    No Known Allergies  OBJECTIVE: Blood pressure 129/79, pulse 68, temperature 98 F (36.7 C), temperature source Oral, resp. rate 16, height '6\' 5"'$  (1.956 m), weight 88.6 kg, SpO2 99 %.  Physical Exam Constitutional:      Comments: He is very pleasant and in no distress.  He is sitting up in a chair.  Cardiovascular:     Rate and Rhythm: Normal rate and regular rhythm.     Heart sounds: No murmur.  Pulmonary:     Effort: Pulmonary effort is normal.     Breath sounds: Normal breath sounds.  Chest:    Abdominal:     Palpations: Abdomen is soft.  Tenderness: There is no abdominal tenderness.  Skin:    Findings: No rash.     Comments: Left arm PIC site looks good.  Psychiatric:        Mood and Affect: Mood normal.     Lab Results Lab Results  Component Value Date   WBC 2.4 (L) 12/31/2018   HGB 8.5 (L) 12/31/2018   HCT 26.8 (L) 12/31/2018   MCV 86.7 12/31/2018   PLT 124 (L) 12/31/2018    Lab Results  Component Value Date   CREATININE 0.63 12/31/2018   BUN 5 (L) 12/31/2018   NA 135 12/31/2018   K 3.6 12/31/2018   CL 106 12/31/2018   CO2 23 12/31/2018    Lab Results  Component Value Date   ALT 46 (H) 12/31/2018   AST 37 12/31/2018   ALKPHOS 522 (H) 12/31/2018   BILITOT 0.5 12/31/2018     Microbiology: Recent Results (from the past 240 hour(s))  Culture, Blood     Status: Abnormal   Collection Time: 12/24/18 11:25 AM   Specimen: BLOOD RIGHT ARM  Result Value Ref Range Status   Specimen Description   Final    BLOOD RIGHT ARM Performed at [facility], [address] [place], [place] [address]    Special Requests   Final    BOTTLES DRAWN AEROBIC AND ANAEROBIC Blood Culture adequate volume   Culture  Setup Time   Final    AEROBIC BOTTLE ONLY GRAM POSITIVE COCCI CRITICAL RESULT CALLED TO, READ BACK BY AND VERIFIED WITH: J LEGGE PHARMD 12/25/18 2030 JDW GRAM  VARIABLE ROD ANAEROBIC BOTTLE ONLY CRITICAL RESULT CALLED TO, READ BACK BY AND VERIFIED WITH: Damian Leavell [phone] MLM Performed at [facility], [place] [address] [place], [place] [address]    Culture STREPTOCOCCUS ANGINOSIS (A)  Final   Report Status 12/28/2018 FINAL  Final   Organism ID, Bacteria STREPTOCOCCUS ANGINOSIS  Final      Susceptibility   Streptococcus anginosis - MIC*    PENICILLIN 0.12 SENSITIVE Sensitive     CEFTRIAXONE 0.5 SENSITIVE Sensitive     ERYTHROMYCIN <=0.12 SENSITIVE Sensitive     LEVOFLOXACIN <=0.25 SENSITIVE Sensitive     VANCOMYCIN 0.5 SENSITIVE Sensitive     * STREPTOCOCCUS ANGINOSIS  Blood Culture ID Panel (Reflexed)     Status: Abnormal   Collection Time: 12/24/18 11:25 AM  Result Value Ref Range Status   Enterococcus species NOT DETECTED NOT DETECTED Final   Listeria monocytogenes NOT DETECTED NOT DETECTED Final   Staphylococcus species NOT DETECTED NOT DETECTED Final   Staphylococcus aureus (BCID) NOT DETECTED NOT DETECTED Final   Streptococcus species DETECTED (A) NOT DETECTED Final    Comment: Not Enterococcus species, Streptococcus agalactiae, Streptococcus pyogenes, or Streptococcus pneumoniae. CRITICAL RESULT CALLED TO, READ BACK BY AND VERIFIED WITH: J LEGGE PHARMD 12/25/18 2030 JDW    Streptococcus agalactiae NOT DETECTED NOT DETECTED Final   Streptococcus pneumoniae NOT DETECTED NOT DETECTED Final   Streptococcus pyogenes NOT DETECTED NOT DETECTED Final   Acinetobacter baumannii NOT DETECTED NOT DETECTED Final   Enterobacteriaceae species NOT DETECTED NOT DETECTED Final   Enterobacter cloacae complex NOT DETECTED NOT DETECTED Final   Escherichia coli NOT DETECTED NOT DETECTED Final   Klebsiella oxytoca NOT DETECTED NOT DETECTED Final   Klebsiella pneumoniae NOT DETECTED NOT DETECTED Final   Proteus species NOT DETECTED NOT DETECTED Final   Serratia marcescens NOT DETECTED NOT DETECTED Final   Haemophilus influenzae NOT  DETECTED NOT DETECTED Final   Neisseria meningitidis NOT DETECTED NOT DETECTED Final   Pseudomonas aeruginosa NOT DETECTED NOT DETECTED Final   Candida albicans NOT DETECTED NOT DETECTED Final   Candida glabrata NOT DETECTED NOT DETECTED Final   Candida krusei NOT DETECTED NOT DETECTED Final   Candida parapsilosis NOT DETECTED NOT DETECTED Final   Candida tropicalis NOT DETECTED NOT DETECTED Final    Comment: Performed at [facility], [place] [address] [place], [place] [address]  Culture, Blood     Status: Abnormal   Collection Time: 12/24/18 12:09 PM   Specimen: Porta Cath; Blood  Result Value Ref Range Status   Specimen Description   Final    PORTA CATH Performed at [facility], [place] [address], [place], [place] [address]    Special Requests   Final    NONE Performed at [facility], [place] [address], [place], [place] [address]    Culture  Setup Time   Final    GRAM NEGATIVE RODS ANAEROBIC BOTTLE ONLY CRITICAL RESULT CALLED TO, READ BACK BY AND VERIFIED WITH: Sheffield Slider PharmD 12:00 12/27/18 ([email or web address])    Culture (A)  Final    BACTEROIDES THETAIOTAOMICRON BETA LACTAMASE POSITIVE Performed at [facility], [place] [address], [place], [place] [address]    Report Status 12/31/2018 FINAL  Final  Blood Culture ID Panel (Reflexed)     Status: None   Collection Time: 12/24/18 12:09 PM  Result Value Ref Range Status   Enterococcus species NOT DETECTED NOT DETECTED Final   Listeria monocytogenes NOT DETECTED NOT DETECTED Final   Staphylococcus species NOT DETECTED NOT DETECTED Final   Staphylococcus aureus (BCID) NOT DETECTED NOT DETECTED Final   Streptococcus species NOT DETECTED NOT DETECTED Final   Streptococcus agalactiae NOT DETECTED NOT DETECTED Final   Streptococcus pneumoniae NOT DETECTED NOT DETECTED Final   Streptococcus pyogenes NOT DETECTED NOT DETECTED Final   Acinetobacter baumannii NOT DETECTED NOT DETECTED  Final   Enterobacteriaceae species NOT DETECTED NOT DETECTED Final   Enterobacter cloacae complex NOT DETECTED NOT DETECTED Final   Escherichia coli NOT DETECTED NOT DETECTED Final   Klebsiella oxytoca NOT DETECTED NOT DETECTED Final   Klebsiella pneumoniae NOT DETECTED NOT DETECTED Final   Proteus species NOT DETECTED NOT DETECTED Final   Serratia marcescens NOT DETECTED NOT DETECTED Final   Haemophilus influenzae NOT DETECTED NOT DETECTED Final   Neisseria meningitidis NOT DETECTED NOT DETECTED Final   Pseudomonas aeruginosa NOT DETECTED NOT DETECTED Final   Candida albicans NOT DETECTED NOT DETECTED Final   Candida glabrata NOT DETECTED NOT DETECTED Final   Candida krusei NOT DETECTED NOT DETECTED Final   Candida parapsilosis NOT DETECTED NOT DETECTED Final   Candida tropicalis NOT DETECTED NOT DETECTED Final    Comment: Performed at [facility], [place] [address], [place], [place] [address]  Blood Culture ID Panel (Reflexed)     Status: None   Collection Time: 12/24/18 12:27 PM  Result Value Ref Range Status   Enterococcus species NOT DETECTED NOT DETECTED Final   Listeria monocytogenes NOT DETECTED NOT DETECTED Final   Staphylococcus species NOT DETECTED NOT DETECTED Final   Staphylococcus aureus (BCID) NOT DETECTED NOT DETECTED Final   Streptococcus species NOT DETECTED NOT DETECTED Final   Streptococcus agalactiae NOT DETECTED NOT DETECTED Final   Streptococcus pneumoniae NOT DETECTED NOT DETECTED Final   Streptococcus pyogenes NOT DETECTED NOT DETECTED Final   Acinetobacter baumannii NOT  DETECTED NOT DETECTED Final   Enterobacteriaceae species NOT DETECTED NOT DETECTED Final   Enterobacter cloacae complex NOT DETECTED NOT DETECTED Final   Escherichia coli NOT DETECTED NOT DETECTED Final   Klebsiella oxytoca NOT DETECTED NOT DETECTED Final   Klebsiella pneumoniae NOT DETECTED NOT DETECTED Final   Proteus species NOT DETECTED NOT DETECTED Final   Serratia marcescens NOT  DETECTED NOT DETECTED Final   Haemophilus influenzae NOT DETECTED NOT DETECTED Final   Neisseria meningitidis NOT DETECTED NOT DETECTED Final   Pseudomonas aeruginosa NOT DETECTED NOT DETECTED Final   Candida albicans NOT DETECTED NOT DETECTED Final   Candida glabrata NOT DETECTED NOT DETECTED Final   Candida krusei NOT DETECTED NOT DETECTED Final   Candida parapsilosis NOT DETECTED NOT DETECTED Final   Candida tropicalis NOT DETECTED NOT DETECTED Final    Comment: Performed at [facility], [place] [address] [place], [place] [address]  Urine culture     Status: None   Collection Time: 12/24/18  1:28 PM   Specimen: Urine, Clean Catch  Result Value Ref Range Status   Specimen Description   Final    URINE, CLEAN CATCH Performed at [facility], [place] [address] [place], [place] [address]    Special Requests   Final    NONE Performed at [facility], [place] [address], [place], [place] [address]    Culture   Final    NO GROWTH Performed at [facility], [place] [address] [place], [place] [address]    Report Status 12/25/2018 FINAL  Final  SARS Coronavirus 2 (CEPHEID - Performed in [facility] hospital lab), Hosp Order     Status: None   Collection Time: 12/24/18  1:28 PM   Specimen: Nasopharyngeal Swab  Result Value Ref Range Status   SARS Coronavirus 2 NEGATIVE NEGATIVE Final    Comment: (NOTE) If result is NEGATIVE SARS-CoV-2 target nucleic acids are NOT DETECTED. The SARS-CoV-2 RNA is generally detectable in upper and lower  respiratory specimens during the acute phase of infection. The lowest  concentration of SARS-CoV-2 viral copies this assay can detect is 250  copies / mL. A negative result does not preclude SARS-CoV-2 infection  and should not be used as the sole basis for treatment or other  patient management decisions.  A negative result may occur with  improper specimen collection / handling, submission of specimen other   than nasopharyngeal swab, presence of viral mutation(s) within the  areas targeted by this assay, and inadequate number of viral copies  (<250 copies / mL). A negative result must be combined with clinical  observations, patient history, and epidemiological information. If result is POSITIVE SARS-CoV-2 target nucleic acids are DETECTED. The SARS-CoV-2 RNA is generally detectable in upper and lower  respiratory specimens dur ing the acute phase of infection.  Positive  results are indicative of active infection with SARS-CoV-2.  Clinical  correlation with patient history and other diagnostic information is  necessary to determine patient infection status.  Positive results do  not rule out bacterial infection or co-infection with other viruses. If result is PRESUMPTIVE POSTIVE SARS-CoV-2 nucleic acids MAY BE PRESENT.   A presumptive positive result was obtained on the submitted specimen  and confirmed on repeat testing.  While 2019 novel coronavirus  (SARS-CoV-2) nucleic acids may be present in the submitted sample  additional confirmatory testing may be necessary for epidemiological  and / or clinical management purposes  to differentiate between  SARS-CoV-2  and other Sarbecovirus currently known to infect humans.  If clinically indicated additional testing with an alternate test  methodology [phone]) is advised. The SARS-CoV-2 RNA is generally  detectable in upper and lower respiratory sp ecimens during the acute  phase of infection. The expected result is Negative. Fact Sheet for Patients:  StrictlyIdeas.no Fact Sheet for Healthcare Providers: BankingDealers.co.za This test is not yet approved or cleared by the [place] FDA and has been authorized for detection and/or diagnosis of SARS-CoV-2 by FDA under an Emergency Use Authorization (EUA).  This EUA will remain in effect (meaning this test can be used) for the duration of the  COVID-19 declaration under Section 564(b)(1) of the Act, 21 U.S.C. section 360bbb-3(b)(1), unless the authorization is terminated or revoked sooner. Performed at [facility], [place] [address], [place], [place] [address]   Culture, blood (routine x 2)     Status: None   Collection Time: 12/24/18  5:22 PM   Specimen: BLOOD  Result Value Ref Range Status   Specimen Description   Final    BLOOD LEFT ANTECUBITAL Performed at [facility], [place] [address], [place], [place] [address]    Special Requests   Final    BOTTLES DRAWN AEROBIC AND ANAEROBIC Blood Culture results may not be optimal due to an excessive volume of blood received in culture bottles Performed at [place] [address], [place], [place] [address]    Culture   Final    NO GROWTH 5 DAYS Performed at [facility], [place] [address], [place], [place] [address]    Report Status 12/29/2018 FINAL  Final  Culture, blood (routine x 2)     Status: None   Collection Time: 12/24/18  5:29 PM   Specimen: BLOOD  Result Value Ref Range Status   Specimen Description   Final    BLOOD RIGHT ANTECUBITAL Performed at [facility], [place] [address], [place], [place] [address]    Special Requests   Final    BOTTLES DRAWN AEROBIC AND ANAEROBIC Blood Culture adequate volume Performed at [place] [address] [place], [place] [address]    Culture   Final    NO GROWTH 5 DAYS Performed at [facility], [place] [address], [place], [place] [address]    Report Status 12/29/2018 FINAL  Final  Culture, blood (Routine x 2)     Status: None (Preliminary result)   Collection Time: 12/28/18  5:50 PM   Specimen: BLOOD LEFT FOREARM  Result Value Ref Range Status   Specimen Description   Final    BLOOD LEFT FOREARM Performed at [facility], [place] [address] [place], [place] [address]    Special Requests   Final    BOTTLES DRAWN AEROBIC AND ANAEROBIC Blood  Culture adequate volume Performed at [place] [address], [place], [place] [address]    Culture   Final    NO GROWTH 3 DAYS Performed at [facility], [place] [address], [place], [place] [address]    Report Status PENDING  Incomplete  Culture, blood (Routine x 2)     Status: None (Preliminary result)   Collection Time: 12/28/18  5:50 PM   Specimen: BLOOD  Result Value Ref Range Status   Specimen Description   Final    BLOOD RIGHT MIDLINE Performed at [facility] [address] [place], [place] [address]    Special  Requests   Final    BOTTLES DRAWN AEROBIC AND ANAEROBIC Blood Culture adequate volume Performed at [facility] [address], [place], [place] [address]    Culture   Final    NO GROWTH 3 DAYS Performed at [facility], [place] [address] [place], [place] [address]    Report Status PENDING  Incomplete  SARS Coronavirus 2 (CEPHEID - Performed in [place] hospital lab), Hosp Order     Status: None   Collection Time: 12/28/18  5:52 PM   Specimen: Nasopharyngeal Swab  Result Value Ref Range Status   SARS Coronavirus 2 NEGATIVE NEGATIVE Final    Comment: (NOTE) If result is NEGATIVE SARS-CoV-2 target nucleic acids are NOT DETECTED. The SARS-CoV-2 RNA is generally detectable in upper and lower  respiratory specimens during the acute phase of infection. The lowest  concentration of SARS-CoV-2 viral copies this assay can detect is 250  copies / mL. A negative result does not preclude SARS-CoV-2 infection  and should not be used as the sole basis for treatment or other  patient management decisions.  A negative result may occur with  improper specimen collection / handling, submission of specimen other  than nasopharyngeal swab, presence of viral mutation(s) within the  areas targeted by this assay, and inadequate number of viral copies  (<250 copies / mL). A negative result must be combined  with clinical  observations, patient history, and epidemiological information. If result is POSITIVE SARS-CoV-2 target nucleic acids are DETECTED. The SARS-CoV-2 RNA is generally detectable in upper and lower  respiratory specimens dur ing the acute phase of infection.  Positive  results are indicative of active infection with SARS-CoV-2.  Clinical  correlation with patient history and other diagnostic information is  necessary to determine patient infection status.  Positive results do  not rule out bacterial infection or co-infection with other viruses. If result is PRESUMPTIVE POSTIVE SARS-CoV-2 nucleic acids MAY BE PRESENT.   A presumptive positive result was obtained on the submitted specimen  and confirmed on repeat testing.  While 2019 novel coronavirus  (SARS-CoV-2) nucleic acids may be present in the submitted sample  additional confirmatory testing may be necessary for epidemiological  and / or clinical management purposes  to differentiate between  SARS-CoV-2 and other Sarbecovirus currently known to infect humans.  If clinically indicated additional testing with an alternate test  methodology [phone]) is advised. The SARS-CoV-2 RNA is generally  detectable in upper and lower respiratory sp ecimens during the acute  phase of infection. The expected result is Negative. Fact Sheet for Patients:  StrictlyIdeas.no Fact Sheet for Healthcare Providers: BankingDealers.co.za This test is not yet approved or cleared by the [place] FDA and has been authorized for detection and/or diagnosis of SARS-CoV-2 by FDA under an Emergency Use Authorization (EUA).  This EUA will remain in effect (meaning this test can be used) for the duration of the COVID-19 declaration under Section 564(b)(1) of the Act, 21 U.S.C. section 360bbb-3(b)(1), unless the authorization is terminated or revoked sooner. Performed at [facility], [place] [address], [place], [place] [address]   Gram stain     Status: None   Collection Time: 12/31/18 12:51 PM   Specimen: Peritoneal Washings  Result Value Ref Range Status   Specimen Description PERITONEAL  Final   Special Requests NONE  Final   Gram Stain   Final    NO WBC SEEN NO ORGANISMS SEEN Performed at [facility],  [address], [place], [place] [address]    Report Status 12/31/2018 FINAL  Final    Michel Bickers, MD [facility] [place] [facility] [phone] pager   [phone] cell 12/31/2018, 4:36 PM

## 2019-01-01 DIAGNOSIS — R197 Diarrhea, unspecified: Secondary | ICD-10-CM

## 2019-01-01 DIAGNOSIS — B377 Candidal sepsis: Secondary | ICD-10-CM | POA: Diagnosis present

## 2019-01-01 DIAGNOSIS — Z8619 Personal history of other infectious and parasitic diseases: Secondary | ICD-10-CM

## 2019-01-01 DIAGNOSIS — R109 Unspecified abdominal pain: Secondary | ICD-10-CM

## 2019-01-01 LAB — COMPREHENSIVE METABOLIC PANEL
ALT: 43 U/L (ref 0–44)
AST: 40 U/L (ref 15–41)
Albumin: 2.3 g/dL — ABNORMAL LOW (ref 3.5–5.0)
Alkaline Phosphatase: 522 U/L — ABNORMAL HIGH (ref 38–126)
Anion gap: 7 (ref 5–15)
BUN: <5 mg/dL — ABNORMAL LOW (ref 8–23)
CO2: 24 mmol/L (ref 22–32)
Calcium: 7.8 mg/dL — ABNORMAL LOW (ref 8.9–10.3)
Chloride: 102 mmol/L (ref 98–111)
Creatinine, Ser: 0.61 mg/dL (ref 0.61–1.24)
GFR calc Af Amer: >60 mL/min (ref >60)
GFR calc non Af Amer: >60 mL/min (ref >60)
Glucose, Bld: 114 mg/dL — ABNORMAL HIGH (ref 70–99)
Potassium: 3.7 mmol/L (ref 3.5–5.1)
Sodium: 133 mmol/L — ABNORMAL LOW (ref 135–145)
Total Bilirubin: 0.6 mg/dL (ref 0.3–1.2)
Total Protein: 4.8 g/dL — ABNORMAL LOW (ref 6.5–8.1)

## 2019-01-01 LAB — CBC
HCT: 26.9 % — ABNORMAL LOW (ref 39.0–52.0)
Hemoglobin: 8.8 g/dL — ABNORMAL LOW (ref 13.0–17.0)
MCH: 28.2 pg (ref 26.0–34.0)
MCHC: 32.7 g/dL (ref 30.0–36.0)
MCV: 86.2 fL (ref 80.0–100.0)
Platelets: 128 10*3/uL — ABNORMAL LOW (ref 150–400)
RBC: 3.12 MIL/uL — ABNORMAL LOW (ref 4.22–5.81)
RDW: 15 % (ref 11.5–15.5)
WBC: 2.3 10*3/uL — ABNORMAL LOW (ref 4.0–10.5)
nRBC: 0 % (ref 0.0–0.2)

## 2019-01-01 LAB — MAGNESIUM: Magnesium: 2.1 mg/dL (ref 1.7–2.4)

## 2019-01-01 NOTE — Progress Notes (Signed)
Pharmacy - Antimicrobial Stewardship  Called micro to request sensitivities be sent for C. Glabrata from 6/26 blood cx.  [facility] will sub out on a new plate and send to [facility]  Doreene Eland, PharmD, BCPS.   Work Cell: [phone] 01/01/2019 4:04 PM

## 2019-01-01 NOTE — Progress Notes (Signed)
Patient ID: Cory Kelly, male   DOB: [birth year], 63 y.o.   MRN: [id number]         [facility]  Date of Admission:  12/28/2018   Total days of antibiotics 9        Day 1 ertapenem        Day 1 anidulafungin         ASSESSMENT: Cory Kelly has had recurring admissions over the past 2 months with polymicrobial bloodstream infection.  He had group C strep bacteremia in early May treated with 2 weeks of ceftriaxone.  He was readmitted last week with strep anginosis and Bacteroides bacteremia.  He was readmitted on 12/28/2018 with fever.  He defervesced promptly with empiric broadening of his antibacterial therapy.  Last night 1 of 2 admission blood cultures was reported to be growing Candida glabrata.  That was from a peripheral stick blood culture.  I suspect that his infections are coming from a hepatobiliary source.  I ordered repeat blood cultures last night.  I will remove his PICC line today and would wait to insert a new PICC until blood cultures are negative.  I will request antibiotic susceptibility testing of his Candida glabrata.  Based on current guidelines he should have an ophthalmologic exam sometime within the next 2 weeks to rule out endophthalmitis and help guide duration of antifungal therapy.  I will try to talk with radiology today about his recent abdominal CT and MRI scans to see if there is any evidence of infection.  Unfortunately he now has diarrhea.  I will order a C. difficile screen.  PLAN: 1. Continue ertapenem and anidulafungin 2. Remove PICC 3. Check antifungal susceptibilities of his Candida glabrata isolate 4. Await results of repeat blood cultures 5. Recommend ophthalmologic evaluation 6. Reviewed recent scans with radiology 7. C. difficile screen 8. 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[place])   Pancreatic cancer ([place])   Essential hypertension   Hyperlipemia   Scheduled Meds:  amLODipine  10 mg Oral QHS   calamine   Topical BID   doxazosin  1 mg Oral QHS   enoxaparin (LOVENOX) injection  40 mg Subcutaneous Daily   ferrous sulfate  325 mg Oral BID WC   gabapentin  200 mg Oral BID   irbesartan  300 mg Oral Daily   potassium chloride SA  20 mEq Oral BID   Continuous Infusions:  sodium chloride 75 mL/hr at 12/31/18 0219   anidulafungin     ertapenem 1,000 mg (12/31/18 1943)   PRN Meds:.acetaminophen **OR** acetaminophen, albuterol, alum & mag hydroxide-simeth, bisacodyl, guaiFENesin-dextromethorphan, hydrALAZINE, hydrocortisone, hydrocortisone cream, ipratropium, lip balm, loratadine, magnesium citrate, Muscle Rub, ondansetron **OR** ondansetron (ZOFRAN) IV, oxyCODONE, oxyCODONE-acetaminophen **AND** oxyCODONE, phenol, polyethylene glycol, polyvinyl alcohol, prochlorperazine, senna-docusate, sodium chloride, sodium chloride flush, zolpidem   SUBJECTIVE: Cory Kelly has developed watery diarrhea today.  He has had 3 episodes so far.  He has had some mild crampy abdominal pain but no nausea or vomiting.  Review of Systems: Review of Systems  Constitutional: Negative for chills, diaphoresis and fever.  Respiratory: Negative for cough and shortness of breath.   Cardiovascular: Negative for chest pain.  Gastrointestinal: Positive for abdominal pain and diarrhea. Negative for nausea and vomiting.    No Known Allergies  OBJECTIVE: Vitals:  12/31/18 1150 12/31/18 1354 12/31/18 2118 01/01/19 0617  BP: 132/74 129/79 139/85 138/90  Pulse:  68 (!) 55 65  Resp:  16 18 20   Temp:  98 F (36.7 C) 98.3 F (36.8 C) 98 F (36.7 C)  TempSrc:  Oral Oral Oral  SpO2:  99% 98% 97%  Weight:      Height:       Body mass index is 23.16 kg/m.  Physical Exam Constitutional:      Comments: He is up walking in his  room returning from the bathroom. He is pleasant and in good spirits.  Abdominal:     Palpations: Abdomen is soft.     Tenderness: There is no abdominal tenderness.  Skin:    Comments: Right arm PIC site looks good.  Left forearm saline lock IV site looks good.  Psychiatric:        Mood and Affect: Mood normal.     Lab Results Lab Results  Component Value Date   WBC 2.3 (L) 01/01/2019   HGB 8.8 (L) 01/01/2019   HCT 26.9 (L) 01/01/2019   MCV 86.2 01/01/2019   PLT 128 (L) 01/01/2019    Lab Results  Component Value Date   CREATININE 0.61 01/01/2019   BUN <5 (L) 01/01/2019   NA 133 (L) 01/01/2019   K 3.7 01/01/2019   CL 102 01/01/2019   CO2 24 01/01/2019    Lab Results  Component Value Date   ALT 43 01/01/2019   AST 40 01/01/2019   ALKPHOS 522 (H) 01/01/2019   BILITOT 0.6 01/01/2019     Microbiology: Recent Results (from the past 240 hour(s))  Culture, Blood     Status: Abnormal   Collection Time: 12/24/18 11:25 AM   Specimen: BLOOD RIGHT ARM  Result Value Ref Range Status   Specimen Description   Final    BLOOD RIGHT ARM Performed at [facility], [address], [place], [place] [address]    Special Requests   Final    BOTTLES DRAWN AEROBIC AND ANAEROBIC Blood Culture adequate volume   Culture  Setup Time   Final    AEROBIC BOTTLE ONLY GRAM POSITIVE COCCI CRITICAL RESULT CALLED TO, READ BACK BY AND VERIFIED WITH: J LEGGE PHARMD 12/25/18 2030 JDW GRAM VARIABLE ROD ANAEROBIC BOTTLE ONLY CRITICAL RESULT CALLED TO, READ BACK BY AND VERIFIED WITH: Damian Leavell [id number] Performed at [facility], [place] [address] [place], [place] [address]    Culture STREPTOCOCCUS ANGINOSIS (A)  Final   Report Status 12/28/2018 FINAL  Final   Organism ID, Bacteria STREPTOCOCCUS ANGINOSIS  Final      Susceptibility   Streptococcus anginosis - MIC*    PENICILLIN 0.12 SENSITIVE Sensitive     CEFTRIAXONE 0.5 SENSITIVE Sensitive      ERYTHROMYCIN <=0.12 SENSITIVE Sensitive     LEVOFLOXACIN <=0.25 SENSITIVE Sensitive     VANCOMYCIN 0.5 SENSITIVE Sensitive     * STREPTOCOCCUS ANGINOSIS  Blood Culture ID Panel (Reflexed)     Status: Abnormal   Collection Time: 12/24/18 11:25 AM  Result Value Ref Range Status   Enterococcus species NOT DETECTED NOT DETECTED Final   Listeria monocytogenes NOT DETECTED NOT DETECTED Final   Staphylococcus species NOT DETECTED NOT DETECTED Final   Staphylococcus aureus (BCID) NOT DETECTED NOT DETECTED Final   Streptococcus species DETECTED (A) NOT DETECTED Final    Comment: Not Enterococcus species, Streptococcus agalactiae, Streptococcus pyogenes, or Streptococcus pneumoniae. CRITICAL RESULT CALLED TO,  READ BACK BY AND VERIFIED WITH: J LEGGE PHARMD 12/25/18 2030 JDW    Streptococcus agalactiae NOT DETECTED NOT DETECTED Final   Streptococcus pneumoniae NOT DETECTED NOT DETECTED Final   Streptococcus pyogenes NOT DETECTED NOT DETECTED Final   Acinetobacter baumannii NOT DETECTED NOT DETECTED Final   Enterobacteriaceae species NOT DETECTED NOT DETECTED Final   Enterobacter cloacae complex NOT DETECTED NOT DETECTED Final   Escherichia coli NOT DETECTED NOT DETECTED Final   Klebsiella oxytoca NOT DETECTED NOT DETECTED Final   Klebsiella pneumoniae NOT DETECTED NOT DETECTED Final   Proteus species NOT DETECTED NOT DETECTED Final   Serratia marcescens NOT DETECTED NOT DETECTED Final   Haemophilus influenzae NOT DETECTED NOT DETECTED Final   Neisseria meningitidis NOT DETECTED NOT DETECTED Final   Pseudomonas aeruginosa NOT DETECTED NOT DETECTED Final   Candida albicans NOT DETECTED NOT DETECTED Final   Candida glabrata NOT DETECTED NOT DETECTED Final   Candida krusei NOT DETECTED NOT DETECTED Final   Candida parapsilosis NOT DETECTED NOT DETECTED Final   Candida tropicalis NOT DETECTED NOT DETECTED Final    Comment: Performed at [facility], [place] [address] [place], [place] [address]    Culture, Blood     Status: Abnormal   Collection Time: 12/24/18 12:09 PM   Specimen: Porta Cath; Blood  Result Value Ref Range Status   Specimen Description   Final    PORTA CATH Performed at [facility], [place] [address] [place], [place] [address]    Special Requests   Final    NONE Performed at [facility], [place] [address], [place], [place] [address]    Culture  Setup Time   Final    GRAM NEGATIVE RODS ANAEROBIC BOTTLE ONLY CRITICAL RESULT CALLED TO, READ BACK BY AND VERIFIED WITH: [place] Slider PharmD 12:00 12/27/18 ([email or web address])    Culture (A)  Final    BACTEROIDES THETAIOTAOMICRON BETA LACTAMASE POSITIVE Performed at [facility], [place] [address], [place], [place] [address]    Report Status 12/31/2018 FINAL  Final  Blood Culture ID Panel (Reflexed)     Status: None   Collection Time: 12/24/18 12:09 PM  Result Value Ref Range Status   Enterococcus species NOT DETECTED NOT DETECTED Final   Listeria monocytogenes NOT DETECTED NOT DETECTED Final   Staphylococcus species NOT DETECTED NOT DETECTED Final   Staphylococcus aureus (BCID) NOT DETECTED NOT DETECTED Final   Streptococcus species NOT DETECTED NOT DETECTED Final   Streptococcus agalactiae NOT DETECTED NOT DETECTED Final   Streptococcus pneumoniae NOT DETECTED NOT DETECTED Final   Streptococcus pyogenes NOT DETECTED NOT DETECTED Final   Acinetobacter baumannii NOT DETECTED NOT DETECTED Final   Enterobacteriaceae species NOT DETECTED NOT DETECTED Final   Enterobacter cloacae complex NOT DETECTED NOT DETECTED Final   Escherichia coli NOT DETECTED NOT DETECTED Final   Klebsiella oxytoca NOT DETECTED NOT DETECTED Final   Klebsiella pneumoniae NOT DETECTED NOT DETECTED Final   Proteus species NOT DETECTED NOT DETECTED Final   Serratia marcescens NOT DETECTED NOT DETECTED Final   Haemophilus influenzae NOT DETECTED NOT DETECTED Final   Neisseria meningitidis NOT DETECTED  NOT DETECTED Final   Pseudomonas aeruginosa NOT DETECTED NOT DETECTED Final   Candida albicans NOT DETECTED NOT DETECTED Final   Candida glabrata NOT DETECTED NOT DETECTED Final   Candida krusei NOT DETECTED NOT DETECTED Final   Candida parapsilosis NOT DETECTED NOT DETECTED Final   Candida tropicalis NOT DETECTED NOT DETECTED Final  Comment: Performed at [facility], [place] [address], [place], [place] [address]  Blood Culture ID Panel (Reflexed)     Status: None   Collection Time: 12/24/18 12:27 PM  Result Value Ref Range Status   Enterococcus species NOT DETECTED NOT DETECTED Final   Listeria monocytogenes NOT DETECTED NOT DETECTED Final   Staphylococcus species NOT DETECTED NOT DETECTED Final   Staphylococcus aureus (BCID) NOT DETECTED NOT DETECTED Final   Streptococcus species NOT DETECTED NOT DETECTED Final   Streptococcus agalactiae NOT DETECTED NOT DETECTED Final   Streptococcus pneumoniae NOT DETECTED NOT DETECTED Final   Streptococcus pyogenes NOT DETECTED NOT DETECTED Final   Acinetobacter baumannii NOT DETECTED NOT DETECTED Final   Enterobacteriaceae species NOT DETECTED NOT DETECTED Final   Enterobacter cloacae complex NOT DETECTED NOT DETECTED Final   Escherichia coli NOT DETECTED NOT DETECTED Final   Klebsiella oxytoca NOT DETECTED NOT DETECTED Final   Klebsiella pneumoniae NOT DETECTED NOT DETECTED Final   Proteus species NOT DETECTED NOT DETECTED Final   Serratia marcescens NOT DETECTED NOT DETECTED Final   Haemophilus influenzae NOT DETECTED NOT DETECTED Final   Neisseria meningitidis NOT DETECTED NOT DETECTED Final   Pseudomonas aeruginosa NOT DETECTED NOT DETECTED Final   Candida albicans NOT DETECTED NOT DETECTED Final   Candida glabrata NOT DETECTED NOT DETECTED Final   Candida krusei NOT DETECTED NOT DETECTED Final   Candida parapsilosis NOT DETECTED NOT DETECTED Final   Candida tropicalis NOT DETECTED NOT DETECTED Final    Comment: Performed at [facility], [place] [address] [place], [place] [address]  Urine culture     Status: None   Collection Time: 12/24/18  1:28 PM   Specimen: Urine, Clean Catch  Result Value Ref Range Status   Specimen Description   Final    URINE, CLEAN CATCH Performed at [facility], [place] [address], [place], [place] [address]    Special Requests   Final    NONE Performed at [facility], [place] [address], [place], [place] [address]    Culture   Final    NO GROWTH Performed at [facility], [place] [address] [place], [place] [address]    Report Status 12/25/2018 FINAL  Final  SARS Coronavirus 2 (CEPHEID - Performed in [facility] lab), Hosp Order     Status: None   Collection Time: 12/24/18  1:28 PM   Specimen: Nasopharyngeal Swab  Result Value Ref Range Status   SARS Coronavirus 2 NEGATIVE NEGATIVE Final    Comment: (NOTE) If result is NEGATIVE SARS-CoV-2 target nucleic acids are NOT DETECTED. The SARS-CoV-2 RNA is generally detectable in upper and lower  respiratory specimens during the acute phase of infection. The lowest  concentration of SARS-CoV-2 viral copies this assay can detect is 250  copies / mL. A negative result does not preclude SARS-CoV-2 infection  and should not be used as the sole basis for treatment or other  patient management decisions.  A negative result may occur with  improper specimen collection / handling, submission of specimen other  than nasopharyngeal swab, presence of viral mutation(s) within the  areas targeted by this assay, and inadequate number of viral copies  (<250 copies / mL). A negative result must be combined with clinical  observations, patient history, and epidemiological information. If result is POSITIVE SARS-CoV-2 target nucleic acids are DETECTED. The SARS-CoV-2 RNA is generally detectable in upper and lower  respiratory specimens dur ing the  acute phase of infection.  Positive  results are  indicative of active infection with SARS-CoV-2.  Clinical  correlation with patient history and other diagnostic information is  necessary to determine patient infection status.  Positive results do  not rule out bacterial infection or co-infection with other viruses. If result is PRESUMPTIVE POSTIVE SARS-CoV-2 nucleic acids MAY BE PRESENT.   A presumptive positive result was obtained on the submitted specimen  and confirmed on repeat testing.  While 2019 novel coronavirus  (SARS-CoV-2) nucleic acids may be present in the submitted sample  additional confirmatory testing may be necessary for epidemiological  and / or clinical management purposes  to differentiate between  SARS-CoV-2 and other Sarbecovirus currently known to infect humans.  If clinically indicated additional testing with an alternate test  methodology [phone]) is advised. The SARS-CoV-2 RNA is generally  detectable in upper and lower respiratory sp ecimens during the acute  phase of infection. The expected result is Negative. Fact Sheet for Patients:  StrictlyIdeas.no Fact Sheet for Healthcare Providers: BankingDealers.co.za This test is not yet approved or cleared by the [place] FDA and has been authorized for detection and/or diagnosis of SARS-CoV-2 by FDA under an Emergency Use Authorization (EUA).  This EUA will remain in effect (meaning this test can be used) for the duration of the COVID-19 declaration under Section 564(b)(1) of the Act, 21 U.S.C. section 360bbb-3(b)(1), unless the authorization is terminated or revoked sooner. Performed at [facility], [place] [address], [place], [place] [address]   Culture, blood (routine x 2)     Status: None   Collection Time: 12/24/18  5:22 PM   Specimen: BLOOD  Result Value Ref Range Status   Specimen Description   Final    BLOOD LEFT ANTECUBITAL Performed at [facility], [place] [address]  [place], [place] [address]    Special Requests   Final    BOTTLES DRAWN AEROBIC AND ANAEROBIC Blood Culture results may not be optimal due to an excessive volume of blood received in culture bottles Performed at [place] [address], [place], [place] [address]    Culture   Final    NO GROWTH 5 DAYS Performed at [facility], [place] [address] [place], [place] [address]    Report Status 12/29/2018 FINAL  Final  Culture, blood (routine x 2)     Status: None   Collection Time: 12/24/18  5:29 PM   Specimen: BLOOD  Result Value Ref Range Status   Specimen Description   Final    BLOOD RIGHT ANTECUBITAL Performed at [facility], [place] [address], [place], [place] [address]    Special Requests   Final    BOTTLES DRAWN AEROBIC AND ANAEROBIC Blood Culture adequate volume Performed at [place] [address], [place], [place] [address]    Culture   Final    NO GROWTH 5 DAYS Performed at [facility], [place] [address], [place], [place] [address]    Report Status 12/29/2018 FINAL  Final  Culture, blood (Routine x 2)     Status: Abnormal (Preliminary result)   Collection Time: 12/28/18  5:50 PM   Specimen: BLOOD LEFT FOREARM  Result Value Ref Range Status   Specimen Description   Final    BLOOD LEFT FOREARM Performed at [facility], [place] [address], [place],  [address]    Special Requests   Final    BOTTLES DRAWN AEROBIC  AND ANAEROBIC Blood Culture adequate volume Performed at [place] [address] [place], [place] [address]    Culture  Setup Time   Final    ANAEROBIC BOTTLE ONLY YEAST CRITICAL RESULT CALLED TO, READ BACK BY AND VERIFIED WITH: [place] Slider [facility] 12/31/18 2103 JDW    Culture (A)  Final    CANDIDA GLABRATA CULTURE REINCUBATED FOR BETTER GROWTH Performed at [facility], [place] [address] [place], [place] [address]    Report Status PENDING  Incomplete  Culture, blood  (Routine x 2)     Status: None (Preliminary result)   Collection Time: 12/28/18  5:50 PM   Specimen: BLOOD  Result Value Ref Range Status   Specimen Description   Final    BLOOD RIGHT MIDLINE Performed at [place] [address] [place], [place] [address]    Special Requests   Final    BOTTLES DRAWN AEROBIC AND ANAEROBIC Blood Culture adequate volume Performed at [place] [address], [place], [place] [address]    Culture   Final    NO GROWTH 4 DAYS Performed at [facility], [place] [address], [place], [place] [address]    Report Status PENDING  Incomplete  Blood Culture ID Panel (Reflexed)     Status: Abnormal   Collection Time: 12/28/18  5:50 PM  Result Value Ref Range Status   Enterococcus species NOT DETECTED NOT DETECTED Final   Listeria monocytogenes NOT DETECTED NOT DETECTED Final   Staphylococcus species NOT DETECTED NOT DETECTED Final   Staphylococcus aureus (BCID) NOT DETECTED NOT DETECTED Final   Streptococcus species NOT DETECTED NOT DETECTED Final   Streptococcus agalactiae NOT DETECTED NOT DETECTED Final   Streptococcus pneumoniae NOT DETECTED NOT DETECTED Final   Streptococcus pyogenes NOT DETECTED NOT DETECTED Final   Acinetobacter baumannii NOT DETECTED NOT DETECTED Final   Enterobacteriaceae species NOT DETECTED NOT DETECTED Final   Enterobacter cloacae complex NOT DETECTED NOT DETECTED Final   Escherichia coli NOT DETECTED NOT DETECTED Final   Klebsiella oxytoca NOT DETECTED NOT DETECTED Final   Klebsiella pneumoniae NOT DETECTED NOT DETECTED Final   Proteus species NOT DETECTED NOT DETECTED Final   Serratia marcescens NOT DETECTED NOT DETECTED Final   Haemophilus influenzae NOT DETECTED NOT DETECTED Final   Neisseria meningitidis NOT DETECTED NOT DETECTED Final   Pseudomonas aeruginosa NOT DETECTED NOT DETECTED Final   Candida albicans NOT DETECTED NOT DETECTED Final   Candida glabrata DETECTED (A) NOT  DETECTED Final    Comment: CRITICAL RESULT CALLED TO, READ BACK BY AND VERIFIED WITH: [place] Slider [facility] 12/31/18 2103 JDW    Candida krusei NOT DETECTED NOT DETECTED Final   Candida parapsilosis NOT DETECTED NOT DETECTED Final   Candida tropicalis NOT DETECTED NOT DETECTED Final    Comment: Performed at [facility], [address], [place], [place] [address]  SARS Coronavirus 2 (CEPHEID - Performed in [place] [facility] lab), Hosp Order     Status: None   Collection Time: 12/28/18  5:52 PM   Specimen: Nasopharyngeal Swab  Result Value Ref Range Status   SARS Coronavirus 2 NEGATIVE NEGATIVE Final    Comment: (NOTE) If result is NEGATIVE SARS-CoV-2 target nucleic acids are NOT DETECTED. The SARS-CoV-2 RNA is generally detectable in upper and lower  respiratory specimens during the acute phase of infection. The lowest  concentration of SARS-CoV-2 viral copies this assay can detect is 250  copies / mL.  A negative result does not preclude SARS-CoV-2 infection  and should not be used as the sole basis for treatment or other  patient management decisions.  A negative result may occur with  improper specimen collection / handling, submission of specimen other  than nasopharyngeal swab, presence of viral mutation(s) within the  areas targeted by this assay, and inadequate number of viral copies  (<250 copies / mL). A negative result must be combined with clinical  observations, patient history, and epidemiological information. If result is POSITIVE SARS-CoV-2 target nucleic acids are DETECTED. The SARS-CoV-2 RNA is generally detectable in upper and lower  respiratory specimens dur ing the acute phase of infection.  Positive  results are indicative of active infection with SARS-CoV-2.  Clinical  correlation with patient history and other diagnostic information is  necessary to determine patient infection status.  Positive results do  not rule out bacterial infection or co-infection  with other viruses. If result is PRESUMPTIVE POSTIVE SARS-CoV-2 nucleic acids MAY BE PRESENT.   A presumptive positive result was obtained on the submitted specimen  and confirmed on repeat testing.  While 2019 novel coronavirus  (SARS-CoV-2) nucleic acids may be present in the submitted sample  additional confirmatory testing may be necessary for epidemiological  and / or clinical management purposes  to differentiate between  SARS-CoV-2 and other Sarbecovirus currently known to infect humans.  If clinically indicated additional testing with an alternate test  methodology [phone]) is advised. The SARS-CoV-2 RNA is generally  detectable in upper and lower respiratory sp ecimens during the acute  phase of infection. The expected result is Negative. Fact Sheet for Patients:  StrictlyIdeas.no Fact Sheet for Healthcare Providers: BankingDealers.co.za This test is not yet approved or cleared by the [place] FDA and has been authorized for detection and/or diagnosis of SARS-CoV-2 by FDA under an Emergency Use Authorization (EUA).  This EUA will remain in effect (meaning this test can be used) for the duration of the COVID-19 declaration under Section 564(b)(1) of the Act, 21 U.S.C. section 360bbb-3(b)(1), unless the authorization is terminated or revoked sooner. Performed at [facility], [place] [address], [place], [place] [address]   Culture, body fluid-bottle     Status: None (Preliminary result)   Collection Time: 12/31/18 12:51 PM   Specimen: Peritoneal Washings  Result Value Ref Range Status   Specimen Description PERITONEAL  Final   Special Requests NONE  Final   Culture   Final    NO GROWTH < 24 HOURS Performed at [facility], [place] [address] [place], [place] [address]    Report Status PENDING  Incomplete  Gram stain     Status: None   Collection Time: 12/31/18 12:51 PM   Specimen: Peritoneal  Washings  Result Value Ref Range Status   Specimen Description PERITONEAL  Final   Special Requests NONE  Final   Gram Stain   Final    NO WBC SEEN NO ORGANISMS SEEN Performed at [facility], [address], [place], [place] [address]    Report Status 12/31/2018 FINAL  Final  Culture, blood (routine x 2)     Status: None (Preliminary result)   Collection Time: 12/31/18  9:54 PM   Specimen: BLOOD  Result Value Ref Range Status   Specimen Description   Final    BLOOD LEFT ANTECUBITAL Performed at [place] [address] [place], [place] [address]    Special Requests   Final  BOTTLES DRAWN AEROBIC AND ANAEROBIC Blood Culture adequate volume Performed at [place] [address], [place], [place] [address]    Culture   Final    NO GROWTH < 12 HOURS Performed at [place] [address], [place], [place] [id number]    Report Status PENDING  Incomplete  Culture, blood (routine x 2)     Status: None (Preliminary result)   Collection Time: 12/31/18  9:54 PM   Specimen: BLOOD  Result Value Ref Range Status   Specimen Description   Final    BLOOD BLOOD RIGHT HAND Performed at [facility] [address], [place], [place] [address]    Special Requests   Final    BOTTLES DRAWN AEROBIC AND ANAEROBIC Blood Culture adequate volume Performed at [facility] [address] [place], [place] [address]    Culture   Final    NO GROWTH < 12 HOURS Performed at [facility] [address] [place], [place] [address]    Report Status PENDING  Incomplete    Michel Bickers, MD [facility] [phone] pager   [phone] cell 01/01/2019, 11:34 AM

## 2019-01-01 NOTE — Progress Notes (Signed)
Received phone call from infectious disease MD stating that patient grew yeast in his blood cultures. New orders received to get a set of repeat blood cultures and to consult pharmacy to start anidulafungin. Patient updated, will continue to closely monitor.

## 2019-01-01 NOTE — Progress Notes (Addendum)
PROGRESS NOTE    Cory Kelly  GUY:[id number] DOB: [birth year] DOA: 12/28/2018 PCP: Cory Bale, PA-C   Brief Narrative:  63 year old with history of metastatic pancreatic cancer status post biliary stent, essential hypertension, hyperlipidemia returned back to the hospital with fevers and chills.  He was recently discharged here from the hospital after being treated for gram-negative and Streptococcus bacteremia with IV Rocephin.  Upon readmission was started on broad-spectrum antibiotics.  ID recommended ertapenem but his cultures also started growing Candida therefore started on antifungal. S/p Paracentesis on 6/29.    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  Plan for 6-week treatment per infectious disease -Antifungal added 6/29.  Repeat cultures ordered-pending -May need PEG removal?.  Depends on repeat culture data -Infectious disease team following. -Repeat TEE has been deferred as it is thought to be low yield. -Echo- normal, neg for endocarditis - His Chemo-Port was removed on his recent last admission and PICC line was placed instead. -Status post paracentesis 6/29-no cell counts ordered.  Gram stain is negative.  Already on ertapenem.  Abdomen is nontender.  Normocytic anemia, iron deficiency -Also from chronic disease.  Iron supplements with bowel regimen.  Hypokalemia magnesium -Repletion ordered.  History of pancreatic cancer status post metastases -Per oncology team.  Large volume stool, constipation -Bowel regimen  Nonobstructive bilateral kidney stones -IV fluids,Flomax stopped at patient's  request.  Peripheral neuropathy -On home gabapentin.  Essential hypertension -Resume home meds.  DVT prophylaxis: Lovenox Code Status: Full code Family Communication: Left [name] a Advertising account executive. Disposition Plan: Maintain hospital stay for IV antibiotics and antifungal.  Infectious disease following.  Consultants:   ID  Onco  Procedures:   None  Antimicrobials:   Vanc D4  Cefepime D4  Flagyl D4  Eraxis D2   Subjective: Sitting up in the chair, no complaints.  Grew fungus his in [facility] yesterday therefore now on Antifungal.  Review of Systems Otherwise negative except as per HPI, including: General = no fevers, chills, dizziness, malaise, fatigue HEENT/EYES = negative for pain, redness, loss of vision, double vision, blurred vision, loss of hearing, sore throat, hoarseness, dysphagia Cardiovascular= negative for chest pain, palpitation, murmurs, lower extremity swelling Respiratory/lungs= negative for shortness of breath, cough, hemoptysis, wheezing, mucus production Gastrointestinal= negative for nausea, vomiting,, abdominal pain, melena, hematemesis Genitourinary= negative for Dysuria, Hematuria, Change in Urinary Frequency MSK = Negative for arthralgia, myalgias, Back Pain, Joint swelling  Neurology= Negative for headache, seizures, numbness, tingling  Psychiatry= Negative for anxiety, depression, suicidal and homocidal ideation Allergy/Immunology= Medication/Food allergy as listed  Skin= Negative for Rash, lesions, ulcers, itching   Objective: Vitals:   12/31/18 1150 12/31/18 1354 12/31/18 2118 01/01/19 0617  BP: 132/74 129/79 139/85 138/90  Pulse:  68 (!) 55 65  Resp:  16 18 20   Temp:  98 F (36.7 C) 98.3 F (36.8 C) 98 F (36.7 C)  TempSrc:  Oral Oral Oral  SpO2:  99% 98% 97%  Weight:      Height:        Intake/Output Summary (Last 24 hours) at 01/01/2019 1025 Last data filed at 01/01/2019 0900 Gross per 24 hour  Intake 1707.67 ml   Output  300 ml  Net 1407.67 ml   Filed Weights   12/28/18 1703  Weight: 88.6 kg    Examination:  Constitutional: NAD, calm, comfortable Eyes: PERRL, lids and conjunctivae normal ENMT: Mucous membranes are moist. Posterior pharynx clear of any exudate or lesions.Normal dentition.  Neck: normal, supple, no masses, no thyromegaly Respiratory: clear to auscultation bilaterally, no wheezing, no crackles. Normal respiratory effort. No accessory muscle use.  Cardiovascular: Regular rate and rhythm, no murmurs / rubs / gallops. No extremity edema. 2+ pedal pulses. No carotid bruits.  Abdomen: no tenderness, no masses palpated. No hepatosplenomegaly. Bowel sounds positive.  Musculoskeletal: no clubbing / cyanosis. No joint deformity upper and lower extremities. Good ROM, no contractures. Normal muscle tone.  Skin: no rashes, lesions, ulcers. No induration Neurologic: CN 2-12 grossly intact. Sensation intact, DTR normal. Strength 5/5 in all 4.  Psychiatric: Normal judgment and insight. Alert and oriented x 3. Normal mood.      PICC RUE since 6/25   Data Reviewed:   CBC: Recent Labs  Lab 12/27/18 0652 12/28/18 1750 12/29/18 0250 12/30/18 0429 12/31/18 0327 01/01/19 0440  WBC 2.2* 5.7 3.5* 2.3* 2.4* 2.3*  NEUTROABS 1.7 5.3  --   --  1.9  --   HGB 9.2* 9.3* 7.8* 8.0* 8.5* 8.8*  HCT 27.8* 27.7* 23.8* 25.3* 26.8* 26.9*  MCV 86.1 85.0 86.2 86.9 86.7 86.2  PLT 90* 97* 86* 100* 124* 170*   Basic Metabolic Panel: Recent Labs  Lab 12/27/18 0652 12/28/18 1750 12/28/18 2334 12/29/18 0250 12/30/18 0429 12/31/18 0327 01/01/19 0440  NA 135 132*  --  133* 135 135 133*  K 3.4* 3.1*  --  3.2* 3.3* 3.6 3.7  CL 105 99  --  104 106 106 102  CO2 23 24  --  23 22 23 24   GLUCOSE 114* 120*  --  134* 124* 110* 114*  BUN 5* 9  --  9 7* 5* <5*  CREATININE 0.68 0.88  --  0.78 0.61 0.63 0.61  CALCIUM 7.7* 7.8*  --  7.5* 7.5* 7.8* 7.8*  MG 2.0  --  1.5*  --  1.9 1.8 2.1  PHOS  --   --  2.8   --   --   --   --    GFR: Estimated Creatinine Clearance: 121.5 mL/min (by C-G formula based on SCr of 0.61 mg/dL). Liver Function Tests: Recent Labs  Lab 12/27/18 0652 12/28/18 1750 12/30/18 0429 12/31/18 0327 01/01/19 0440  AST 28 35 45* 37 40  ALT 56* 51* 48* 46* 43  ALKPHOS 531* 525* 477* 522* 522*  BILITOT 0.4 0.7 0.4 0.5 0.6  PROT 4.9* 5.0* 4.5* 4.8* 4.8*  ALBUMIN 2.5* 2.4* 2.1* 2.3* 2.3*   No results for input(s): LIPASE, AMYLASE in the last 168 hours. No results for input(s): AMMONIA in the last 168 hours. Coagulation Profile: Recent Labs  Lab 12/28/18 1750 12/28/18 2334  INR 1.2 1.2   Cardiac Enzymes: No results for input(s): CKTOTAL, CKMB, CKMBINDEX, TROPONINI in the last 168 hours. BNP (last 3 results) No results for input(s): PROBNP in the last 8760 hours. HbA1C: No results for input(s): HGBA1C in the last 72 hours. CBG: No results for input(s): GLUCAP in the last 168 hours. Lipid Profile: No results for input(s): CHOL, HDL, LDLCALC, TRIG, CHOLHDL, LDLDIRECT in the last 72 hours. Thyroid Function Tests: No results for input(s): TSH, T4TOTAL, FREET4, T3FREE, THYROIDAB in the last 72 hours. Anemia Panel: No results for input(s): VITAMINB12, FOLATE, FERRITIN,  TIBC, IRON, RETICCTPCT in the last 72 hours. Sepsis Labs: Recent Labs  Lab 12/26/18 0555 12/27/18 0652 12/28/18 1708 12/28/18 2334 12/29/18 0250 12/31/18 0327  PROCALCITON 42.75 23.49  --  33.26  --  8.88  LATICACIDVEN  --   --  1.0 0.6 0.6  --     Recent Results (from the past 240 hour(s))  Culture, Blood     Status: Abnormal   Collection Time: 12/24/18 11:25 AM   Specimen: BLOOD RIGHT ARM  Result Value Ref Range Status   Specimen Description   Final    BLOOD RIGHT ARM Performed at [facility], [place] [address], [place], [place] [address]    Special Requests   Final    BOTTLES DRAWN AEROBIC AND ANAEROBIC Blood Culture adequate volume   Culture  Setup Time   Final     AEROBIC BOTTLE ONLY GRAM POSITIVE COCCI CRITICAL RESULT CALLED TO, READ BACK BY AND VERIFIED WITH: J LEGGE PHARMD 12/25/18 2030 JDW GRAM VARIABLE ROD ANAEROBIC BOTTLE ONLY CRITICAL RESULT CALLED TO, READ BACK BY AND VERIFIED WITH: Damian Leavell [id number] Performed at [facility], [place] [address], [place], [place] [address]    Culture STREPTOCOCCUS ANGINOSIS (A)  Final   Report Status 12/28/2018 FINAL  Final   Organism ID, Bacteria STREPTOCOCCUS ANGINOSIS  Final      Susceptibility   Streptococcus anginosis - MIC*    PENICILLIN 0.12 SENSITIVE Sensitive     CEFTRIAXONE 0.5 SENSITIVE Sensitive     ERYTHROMYCIN <=0.12 SENSITIVE Sensitive     LEVOFLOXACIN <=0.25 SENSITIVE Sensitive     VANCOMYCIN 0.5 SENSITIVE Sensitive     * STREPTOCOCCUS ANGINOSIS  Blood Culture ID Panel (Reflexed)     Status: Abnormal   Collection Time: 12/24/18 11:25 AM  Result Value Ref Range Status   Enterococcus species NOT DETECTED NOT DETECTED Final   Listeria monocytogenes NOT DETECTED NOT DETECTED Final   Staphylococcus species NOT DETECTED NOT DETECTED Final   Staphylococcus aureus (BCID) NOT DETECTED NOT DETECTED Final   Streptococcus species DETECTED (A) NOT DETECTED Final    Comment: Not Enterococcus species, Streptococcus agalactiae, Streptococcus pyogenes, or Streptococcus pneumoniae. CRITICAL RESULT CALLED TO, READ BACK BY AND VERIFIED WITH: J LEGGE PHARMD 12/25/18 2030 JDW    Streptococcus agalactiae NOT DETECTED NOT DETECTED Final   Streptococcus pneumoniae NOT DETECTED NOT DETECTED Final   Streptococcus pyogenes NOT DETECTED NOT DETECTED Final   Acinetobacter baumannii NOT DETECTED NOT DETECTED Final   Enterobacteriaceae species NOT DETECTED NOT DETECTED Final   Enterobacter cloacae complex NOT DETECTED NOT DETECTED Final   Escherichia coli NOT DETECTED NOT DETECTED Final   Klebsiella oxytoca NOT DETECTED NOT DETECTED Final   Klebsiella pneumoniae NOT DETECTED NOT DETECTED Final    Proteus species NOT DETECTED NOT DETECTED Final   Serratia marcescens NOT DETECTED NOT DETECTED Final   Haemophilus influenzae NOT DETECTED NOT DETECTED Final   Neisseria meningitidis NOT DETECTED NOT DETECTED Final   Pseudomonas aeruginosa NOT DETECTED NOT DETECTED Final   Candida albicans NOT DETECTED NOT DETECTED Final   Candida glabrata NOT DETECTED NOT DETECTED Final   Candida krusei NOT DETECTED NOT DETECTED Final   Candida parapsilosis NOT DETECTED NOT DETECTED Final   Candida tropicalis NOT DETECTED NOT DETECTED Final    Comment: Performed at [facility], [place] [address] [place],  [address]  Culture, Blood     Status: Abnormal   Collection Time: 12/24/18 12:09 PM  Specimen: Porta Cath; Blood  Result Value Ref Range Status   Specimen Description   Final    PORTA CATH Performed at [facility], [place] [address] [place], [place] [address]    Special Requests   Final    NONE Performed at [facility], [place] [address], [place], [place] [address]    Culture  Setup Time   Final    GRAM NEGATIVE RODS ANAEROBIC BOTTLE ONLY CRITICAL RESULT CALLED TO, READ BACK BY AND VERIFIED WITH: Sheffield Slider PharmD 12:00 12/27/18 ([email or web address])    Culture (A)  Final    BACTEROIDES THETAIOTAOMICRON BETA LACTAMASE POSITIVE Performed at [facility], [place] [address], [place], [place] [address]    Report Status 12/31/2018 FINAL  Final  Blood Culture ID Panel (Reflexed)     Status: None   Collection Time: 12/24/18 12:09 PM  Result Value Ref Range Status   Enterococcus species NOT DETECTED NOT DETECTED Final   Listeria monocytogenes NOT DETECTED NOT DETECTED Final   Staphylococcus species NOT DETECTED NOT DETECTED Final   Staphylococcus aureus (BCID) NOT DETECTED NOT DETECTED Final   Streptococcus species NOT DETECTED NOT DETECTED Final   Streptococcus agalactiae NOT DETECTED NOT DETECTED Final   Streptococcus pneumoniae NOT  DETECTED NOT DETECTED Final   Streptococcus pyogenes NOT DETECTED NOT DETECTED Final   Acinetobacter baumannii NOT DETECTED NOT DETECTED Final   Enterobacteriaceae species NOT DETECTED NOT DETECTED Final   Enterobacter cloacae complex NOT DETECTED NOT DETECTED Final   Escherichia coli NOT DETECTED NOT DETECTED Final   Klebsiella oxytoca NOT DETECTED NOT DETECTED Final   Klebsiella pneumoniae NOT DETECTED NOT DETECTED Final   Proteus species NOT DETECTED NOT DETECTED Final   Serratia marcescens NOT DETECTED NOT DETECTED Final   Haemophilus influenzae NOT DETECTED NOT DETECTED Final   Neisseria meningitidis NOT DETECTED NOT DETECTED Final   Pseudomonas aeruginosa NOT DETECTED NOT DETECTED Final   Candida albicans NOT DETECTED NOT DETECTED Final   Candida glabrata NOT DETECTED NOT DETECTED Final   Candida krusei NOT DETECTED NOT DETECTED Final   Candida parapsilosis NOT DETECTED NOT DETECTED Final   Candida tropicalis NOT DETECTED NOT DETECTED Final    Comment: Performed at [facility], [place] [address] [place], [place] [address]  Blood Culture ID Panel (Reflexed)     Status: None   Collection Time: 12/24/18 12:27 PM  Result Value Ref Range Status   Enterococcus species NOT DETECTED NOT DETECTED Final   Listeria monocytogenes NOT DETECTED NOT DETECTED Final   Staphylococcus species NOT DETECTED NOT DETECTED Final   Staphylococcus aureus (BCID) NOT DETECTED NOT DETECTED Final   Streptococcus species NOT DETECTED NOT DETECTED Final   Streptococcus agalactiae NOT DETECTED NOT DETECTED Final   Streptococcus pneumoniae NOT DETECTED NOT DETECTED Final   Streptococcus pyogenes NOT DETECTED NOT DETECTED Final   Acinetobacter baumannii NOT DETECTED NOT DETECTED Final   Enterobacteriaceae species NOT DETECTED NOT DETECTED Final   Enterobacter cloacae complex NOT DETECTED NOT DETECTED Final   Escherichia coli NOT DETECTED NOT DETECTED Final   Klebsiella oxytoca NOT DETECTED NOT DETECTED  Final   Klebsiella pneumoniae NOT DETECTED NOT DETECTED Final   Proteus species NOT DETECTED NOT DETECTED Final   Serratia marcescens NOT DETECTED NOT DETECTED Final   Haemophilus influenzae NOT DETECTED NOT DETECTED Final   Neisseria meningitidis NOT DETECTED NOT DETECTED Final   Pseudomonas aeruginosa NOT DETECTED NOT DETECTED Final   Candida albicans NOT DETECTED  NOT DETECTED Final   Candida glabrata NOT DETECTED NOT DETECTED Final   Candida krusei NOT DETECTED NOT DETECTED Final   Candida parapsilosis NOT DETECTED NOT DETECTED Final   Candida tropicalis NOT DETECTED NOT DETECTED Final    Comment: Performed at [facility], [place] [address] [place], [place] [address]  Urine culture     Status: None   Collection Time: 12/24/18  1:28 PM   Specimen: Urine, Clean Catch  Result Value Ref Range Status   Specimen Description   Final    URINE, CLEAN CATCH Performed at [facility], [place] [address], [place], [place] [address]    Special Requests   Final    NONE Performed at [facility], [place] [address], [place], [place] [address]    Culture   Final    NO GROWTH Performed at [facility], [place] [address] [place], [place] [address]    Report Status 12/25/2018 FINAL  Final  SARS Coronavirus 2 (CEPHEID - Performed in [facility] lab), Hosp Order     Status: None   Collection Time: 12/24/18  1:28 PM   Specimen: Nasopharyngeal Swab  Result Value Ref Range Status   SARS Coronavirus 2 NEGATIVE NEGATIVE Final    Comment: (NOTE) If result is NEGATIVE SARS-CoV-2 target nucleic acids are NOT DETECTED. The SARS-CoV-2 RNA is generally detectable in upper and lower  respiratory specimens during the acute phase of infection. The lowest  concentration of SARS-CoV-2 viral copies this assay can detect is 250  copies / mL. A negative result does not preclude SARS-CoV-2 infection  and should not be used as the sole basis for treatment or other   patient management decisions.  A negative result may occur with  improper specimen collection / handling, submission of specimen other  than nasopharyngeal swab, presence of viral mutation(s) within the  areas targeted by this assay, and inadequate number of viral copies  (<250 copies / mL). A negative result must be combined with clinical  observations, patient history, and epidemiological information. If result is POSITIVE SARS-CoV-2 target nucleic acids are DETECTED. The SARS-CoV-2 RNA is generally detectable in upper and lower  respiratory specimens dur ing the acute phase of infection.  Positive  results are indicative of active infection with SARS-CoV-2.  Clinical  correlation with patient history and other diagnostic information is  necessary to determine patient infection status.  Positive results do  not rule out bacterial infection or co-infection with other viruses. If result is PRESUMPTIVE POSTIVE SARS-CoV-2 nucleic acids MAY BE PRESENT.   A presumptive positive result was obtained on the submitted specimen  and confirmed on repeat testing.  While 2019 novel coronavirus  (SARS-CoV-2) nucleic acids may be present in the submitted sample  additional confirmatory testing may be necessary for epidemiological  and / or clinical management purposes  to differentiate between  SARS-CoV-2 and other Sarbecovirus currently known to infect humans.  If clinically indicated additional testing with an alternate test  methodology [phone]) is advised. The SARS-CoV-2 RNA is generally  detectable in upper and lower respiratory sp ecimens during the acute  phase of infection. The expected result is Negative. Fact Sheet for Patients:  StrictlyIdeas.no Fact Sheet for Healthcare Providers: BankingDealers.co.za This test is not yet approved or cleared by the [place] FDA and has been authorized for detection and/or diagnosis of SARS-CoV-2  by FDA under an Emergency Use Authorization (EUA).  This EUA will remain in effect (meaning this  test can be used) for the duration of the COVID-19 declaration under Section 564(b)(1) of the Act, 21 U.S.C. section 360bbb-3(b)(1), unless the authorization is terminated or revoked sooner. Performed at [facility], [place] [address] [place], [place] [address]   Culture, blood (routine x 2)     Status: None   Collection Time: 12/24/18  5:22 PM   Specimen: BLOOD  Result Value Ref Range Status   Specimen Description   Final    BLOOD LEFT ANTECUBITAL Performed at [facility], [place] [address] [place], [place] [address]    Special Requests   Final    BOTTLES DRAWN AEROBIC AND ANAEROBIC Blood Culture results may not be optimal due to an excessive volume of blood received in culture bottles Performed at [place] [address], [place], [place] [address]    Culture   Final    NO GROWTH 5 DAYS Performed at [facility], [place] [address], [place], [place] [address]    Report Status 12/29/2018 FINAL  Final  Culture, blood (routine x 2)     Status: None   Collection Time: 12/24/18  5:29 PM   Specimen: BLOOD  Result Value Ref Range Status   Specimen Description   Final    BLOOD RIGHT ANTECUBITAL Performed at [facility], [place] [address], [place], [place] [address]    Special Requests   Final    BOTTLES DRAWN AEROBIC AND ANAEROBIC Blood Culture adequate volume Performed at [place] [address] [place], [place] [address]    Culture   Final    NO GROWTH 5 DAYS Performed at [facility], [place] [address], [place], [place] [address]    Report Status 12/29/2018 FINAL  Final  Culture, blood (Routine x 2)     Status: Abnormal (Preliminary result)   Collection Time: 12/28/18  5:50 PM   Specimen: BLOOD LEFT FOREARM  Result Value Ref Range Status   Specimen Description   Final    BLOOD LEFT  FOREARM Performed at [facility], [place] [address], [place], [place] [address]    Special Requests   Final    BOTTLES DRAWN AEROBIC AND ANAEROBIC Blood Culture adequate volume Performed at [place] [address], [place], [place] [address]    Culture  Setup Time   Final    ANAEROBIC BOTTLE ONLY YEAST CRITICAL RESULT CALLED TO, READ BACK BY AND VERIFIED WITH: Sheffield Slider [facility] 12/31/18 2103 JDW    Culture (A)  Final    CANDIDA GLABRATA CULTURE REINCUBATED FOR BETTER GROWTH Performed at [facility], [place] [address] [place], [place] [address]    Report Status PENDING  Incomplete  Culture, blood (Routine x 2)     Status: None (Preliminary result)   Collection Time: 12/28/18  5:50 PM   Specimen: BLOOD  Result Value Ref Range Status   Specimen Description   Final    BLOOD RIGHT MIDLINE Performed at [place] [address] [place], [place] [address]    Special Requests   Final    BOTTLES DRAWN AEROBIC AND ANAEROBIC Blood Culture adequate volume Performed at [place] [address] [place], [place] [address]    Culture   Final    NO GROWTH 4 DAYS Performed at [facility], [place] [address], [place],  [address]    Report Status PENDING  Incomplete  Blood  Culture ID Panel (Reflexed)     Status: Abnormal   Collection Time: 12/28/18  5:50 PM  Result Value Ref Range Status   Enterococcus species NOT DETECTED NOT DETECTED Final   Listeria monocytogenes NOT DETECTED NOT DETECTED Final   Staphylococcus species NOT DETECTED NOT DETECTED Final   Staphylococcus aureus (BCID) NOT DETECTED NOT DETECTED Final   Streptococcus species NOT DETECTED NOT DETECTED Final   Streptococcus agalactiae NOT DETECTED NOT DETECTED Final   Streptococcus pneumoniae NOT DETECTED NOT DETECTED Final   Streptococcus pyogenes NOT DETECTED NOT DETECTED Final   Acinetobacter baumannii NOT DETECTED NOT DETECTED Final    Enterobacteriaceae species NOT DETECTED NOT DETECTED Final   Enterobacter cloacae complex NOT DETECTED NOT DETECTED Final   Escherichia coli NOT DETECTED NOT DETECTED Final   Klebsiella oxytoca NOT DETECTED NOT DETECTED Final   Klebsiella pneumoniae NOT DETECTED NOT DETECTED Final   Proteus species NOT DETECTED NOT DETECTED Final   Serratia marcescens NOT DETECTED NOT DETECTED Final   Haemophilus influenzae NOT DETECTED NOT DETECTED Final   Neisseria meningitidis NOT DETECTED NOT DETECTED Final   Pseudomonas aeruginosa NOT DETECTED NOT DETECTED Final   Candida albicans NOT DETECTED NOT DETECTED Final   Candida glabrata DETECTED (A) NOT DETECTED Final    Comment: CRITICAL RESULT CALLED TO, READ BACK BY AND VERIFIED WITH: Sheffield Slider [facility] 12/31/18 2103 JDW    Candida krusei NOT DETECTED NOT DETECTED Final   Candida parapsilosis NOT DETECTED NOT DETECTED Final   Candida tropicalis NOT DETECTED NOT DETECTED Final    Comment: Performed at [facility], [address] [place], [place] [address]  SARS Coronavirus 2 (CEPHEID - Performed in [place] [facility] hospital lab), Hosp Order     Status: None   Collection Time: 12/28/18  5:52 PM   Specimen: Nasopharyngeal Swab  Result Value Ref Range Status   SARS Coronavirus 2 NEGATIVE NEGATIVE Final    Comment: (NOTE) If result is NEGATIVE SARS-CoV-2 target nucleic acids are NOT DETECTED. The SARS-CoV-2 RNA is generally detectable in upper and lower  respiratory specimens during the acute phase of infection. The lowest  concentration of SARS-CoV-2 viral copies this assay can detect is 250  copies / mL. A negative result does not preclude SARS-CoV-2 infection  and should not be used as the sole basis for treatment or other  patient management decisions.  A negative result may occur with  improper specimen collection / handling, submission of specimen other  than nasopharyngeal swab, presence of viral mutation(s) within the  areas targeted by this  assay, and inadequate number of viral copies  (<250 copies / mL). A negative result must be combined with clinical  observations, patient history, and epidemiological information. If result is POSITIVE SARS-CoV-2 target nucleic acids are DETECTED. The SARS-CoV-2 RNA is generally detectable in upper and lower  respiratory specimens dur ing the acute phase of infection.  Positive  results are indicative of active infection with SARS-CoV-2.  Clinical  correlation with patient history and other diagnostic information is  necessary to determine patient infection status.  Positive results do  not rule out bacterial infection or co-infection with other viruses. If result is PRESUMPTIVE POSTIVE SARS-CoV-2 nucleic acids MAY BE PRESENT.   A presumptive positive result was obtained on the submitted specimen  and confirmed on repeat testing.  While 2019 novel coronavirus  (SARS-CoV-2) nucleic acids may be present in the submitted sample  additional confirmatory testing may be necessary for epidemiological  and / or  clinical management purposes  to differentiate between  SARS-CoV-2 and other Sarbecovirus currently known to infect humans.  If clinically indicated additional testing with an alternate test  methodology [phone]) is advised. The SARS-CoV-2 RNA is generally  detectable in upper and lower respiratory sp ecimens during the acute  phase of infection. The expected result is Negative. Fact Sheet for Patients:  StrictlyIdeas.no Fact Sheet for Healthcare Providers: BankingDealers.co.za This test is not yet approved or cleared by the [place] FDA and has been authorized for detection and/or diagnosis of SARS-CoV-2 by FDA under an Emergency Use Authorization (EUA).  This EUA will remain in effect (meaning this test can be used) for the duration of the COVID-19 declaration under Section 564(b)(1) of the Act, 21 U.S.C. section 360bbb-3(b)(1),  unless the authorization is terminated or revoked sooner. Performed at [facility], [place] [address], [place], [place] [address]   Culture, body fluid-bottle     Status: None (Preliminary result)   Collection Time: 12/31/18 12:51 PM   Specimen: Peritoneal Washings  Result Value Ref Range Status   Specimen Description PERITONEAL  Final   Special Requests NONE  Final   Culture   Final    NO GROWTH < 24 HOURS Performed at [facility], [place] [address], [place], [place] [address]    Report Status PENDING  Incomplete  Gram stain     Status: None   Collection Time: 12/31/18 12:51 PM   Specimen: Peritoneal Washings  Result Value Ref Range Status   Specimen Description PERITONEAL  Final   Special Requests NONE  Final   Gram Stain   Final    NO WBC SEEN NO ORGANISMS SEEN Performed at [facility], [address] [place], [place] [address]    Report Status 12/31/2018 FINAL  Final  Culture, blood (routine x 2)     Status: None (Preliminary result)   Collection Time: 12/31/18  9:54 PM   Specimen: BLOOD  Result Value Ref Range Status   Specimen Description   Final    BLOOD LEFT ANTECUBITAL Performed at [facility] [address] [place], [place] [address]    Special Requests   Final    BOTTLES DRAWN AEROBIC AND ANAEROBIC Blood Culture adequate volume Performed at [facility] [address] [place], [place] [address]    Culture   Final    NO GROWTH < 12 HOURS Performed at [facility] [address] [place], [place] [address]    Report Status PENDING  Incomplete  Culture, blood (routine x 2)     Status: None (Preliminary result)   Collection Time: 12/31/18  9:54 PM   Specimen: BLOOD  Result Value Ref Range Status   Specimen Description   Final    BLOOD BLOOD RIGHT HAND Performed at [facility] [address], [place], [place] [address]    Special Requests   Final    BOTTLES  DRAWN AEROBIC AND ANAEROBIC Blood Culture adequate volume Performed at [facility] [address], [place], [place] [address]    Culture   Final    NO GROWTH < 12 HOURS Performed at [facility] [address], [place], [place] [address]    Report Status PENDING  Incomplete         Radiology Studies: US Paracentesis  Result Date: 12/31/2018 INDICATION: Patient with history of metastatic pancreatic cancer, ascites. Request made for diagnostic and therapeutic paracentesis.  EXAM: ULTRASOUND GUIDED DIAGNOSTIC AND THERAPEUTIC PARACENTESIS MEDICATIONS: None COMPLICATIONS: None immediate. PROCEDURE: Informed written consent was obtained from the patient after a discussion of the risks, benefits and alternatives to treatment. A timeout was performed prior to the initiation of the procedure. Initial ultrasound scanning demonstrates a small to moderate amount of ascites within the right upper to mid abdominal quadrant. The right upper to mid abdomen was prepped and draped in the usual sterile fashion. 1% lidocaine was used for local anesthesia. Following this, a 19 gauge, 7-cm, Yueh catheter was introduced. An ultrasound image was saved for documentation purposes. The paracentesis was performed. The catheter was removed and a dressing was applied. The patient tolerated the procedure well without immediate post procedural complication. FINDINGS: A total of approximately 2.6 liters of slightly hazy, light yellow fluid was removed. Samples were sent to the laboratory as requested by the clinical team. IMPRESSION: Successful ultrasound-guided diagnostic and therapeutic paracentesis yielding 2.6 liters of peritoneal fluid. Read by: Rowe Robert, PA-C Electronically Signed   By: Sandi Mariscal M.D.   On: 12/31/2018 12:04        Scheduled Meds:  amLODipine  10 mg Oral QHS   calamine   Topical BID   doxazosin  1 mg Oral QHS   enoxaparin (LOVENOX) injection  40 mg Subcutaneous  Daily   ferrous sulfate  325 mg Oral BID WC   gabapentin  200 mg Oral BID   irbesartan  300 mg Oral Daily   potassium chloride SA  20 mEq Oral BID   Continuous Infusions:  sodium chloride 75 mL/hr at 12/31/18 3361   anidulafungin     ertapenem 1,000 mg (12/31/18 1943)     LOS: 4 days   Time spent= 35 mins    Taseen Marasigan Arsenio Loader, MD [facility]  If 7PM-7AM, please contact night-coverage [email or web address] 01/01/2019, 10:25 AM

## 2019-01-01 NOTE — Progress Notes (Signed)
PICC removed per MD order. Educated patient: keep dsg dry and intact X 24 hrs, no heavy lifting or pressure X 24 hrs, signs and symptoms of infection and when to call RN/MD, remaining in bed for 30 mins post removal. Pt verbalized understanding.

## 2019-01-01 NOTE — Progress Notes (Addendum)
HEMATOLOGY-ONCOLOGY PROGRESS NOTE  SUBJECTIVE: Remains afebrile. No recurrent chills or rigors. BC are now positive for candida in addition to strep anginosis and Bacteroides. He had a paracentesis yesterday wit 2.6 Liters removed. Fluid has been sent for diagnostics studies. Reports that his abdomen is still bloated.    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[place])   08/31/2017 - 11/22/2017 Chemotherapy   The patient had palonosetron (ALOXI) injection 0.25 mg, 0.25 mg, Intravenous,  Once, 6 of 6 cycles Administration: 0.25 mg (08/31/2017), 0.25 mg (09/14/2017), 0.25 mg (09/28/2017), 0.25 mg (10/12/2017), 0.25 mg (10/26/2017), 0.25 mg (11/09/2017) pegfilgrastim-cbqv (UDENYCA) injection 6 mg, 6 mg, Subcutaneous, Once, 6 of 6 cycles Administration: 6 mg (09/02/2017), 6 mg (09/16/2017), 6 mg (09/30/2017), 6 mg (10/14/2017), 6 mg (10/28/2017), 6 mg (11/11/2017) irinotecan (CAMPTOSAR) 360 mg in sodium chloride 0.9 % 500 mL chemo infusion, 150 mg/m2 = 360 mg (100 % of original dose 150 mg/m2), Intravenous,  Once, 6 of 6 cycles Dose modification: 150 mg/m2 (original dose 150 mg/m2, Cycle 1, Reason: Provider Judgment) Administration: 360 mg (08/31/2017), 360 mg (09/14/2017), 360 mg (09/28/2017), 340 mg (10/12/2017), 340 mg (10/26/2017), 340 mg (11/09/2017) leucovorin 952 mg in sodium chloride 0.9 % 250 mL infusion, 400 mg/m2 = 952 mg, Intravenous,  Once, 6 of 6 cycles Administration: 952 mg (08/31/2017), 952 mg (09/14/2017), 952 mg (09/28/2017), 888 mg (10/12/2017), 888 mg (10/26/2017), 888 mg (11/09/2017) oxaliplatin (ELOXATIN) 200 mg in dextrose 5 % 500 mL chemo infusion, 85 mg/m2 = 200 mg, Intravenous,  Once, 6 of 6 cycles Dose modification: 65 mg/m2 (original dose 85 mg/m2, Cycle 4, Reason: Provider Judgment) Administration: 200 mg (08/31/2017), 200 mg (09/14/2017), 200 mg (09/28/2017), 145 mg (10/12/2017), 150 mg (10/26/2017), 150 mg  (11/09/2017) fosaprepitant (EMEND) 150 mg, dexamethasone (DECADRON) 12 mg in sodium chloride 0.9 % 145 mL IVPB, , Intravenous,  Once, 6 of 6 cycles Administration:  (08/31/2017),  (09/14/2017),  (09/28/2017),  (10/12/2017),  (10/26/2017),  (11/09/2017) fluorouracil (ADRUCIL) 5,700 mg in sodium chloride 0.9 % 136 mL chemo infusion, 2,400 mg/m2 = 5,700 mg, Intravenous, 1 Day/Dose, 6 of 6 cycles Administration: 5,700 mg (08/31/2017), 5,700 mg (09/14/2017), 5,700 mg (09/28/2017), 5,350 mg (10/12/2017), 5,350 mg (10/26/2017), 5,350 mg (11/09/2017)  for chemotherapy treatment.    12/06/2017 - 02/28/2018 Chemotherapy   The patient had gemcitabine (GEMZAR) 1,786 mg in sodium chloride 0.9 % 250 mL chemo infusion, 800 mg/m2 = 1,786 mg (80 % of original dose 1,000 mg/m2), Intravenous,  Once, 4 of 4 cycles Dose modification: 800 mg/m2 (original dose 1,000 mg/m2, Cycle 1, Reason: Provider Judgment), 800 mg/m2 (original dose 1,000 mg/m2, Cycle 1, Reason: Provider Judgment, Comment: call from MD to decrease dose due to counts) Administration: 1,786 mg (12/20/2017), 1,786 mg (12/06/2017), 1,786 mg (01/03/2018), 1,786 mg (01/17/2018), 1,786 mg (01/31/2018), 1,786 mg (02/14/2018), 1,786 mg (02/28/2018) PACLitaxel-protein bound (ABRAXANE) chemo infusion 225 mg, 100 mg/m2 = 225 mg (80 % of original dose 125 mg/m2), Intravenous, Once, 3 of 3 cycles Dose modification: 100 mg/m2 (original dose 125 mg/m2, Cycle 1, Reason: Provider Judgment), 100 mg/m2 (original dose 125 mg/m2, Cycle 1, Reason: Provider Judgment, Comment: call from MD to decrease dose based on counts) Administration: 225 mg (12/20/2017), 225 mg (12/06/2017), 225 mg (01/03/2018), 225 mg (01/17/2018)  for chemotherapy treatment.    07/04/2018 Cancer Staging   Staging form: Exocrine Pancreas, AJCC 8th Edition - Pathologic: Stage IV (cM1) - Signed by Ladell Pier,  MD on 07/04/2018   08/21/2018 -  Chemotherapy   The patient had palonosetron (ALOXI) injection 0.25 mg, 0.25 mg, Intravenous,   Once, 7 of 8 cycles Administration: 0.25 mg (08/21/2018), 0.25 mg (09/03/2018), 0.25 mg (09/18/2018), 0.25 mg (10/01/2018), 0.25 mg (10/16/2018), 0.25 mg (11/14/2018), 0.25 mg (12/10/2018) pegfilgrastim-cbqv (UDENYCA) injection 6 mg, 6 mg, Subcutaneous, Once, 7 of 8 cycles Administration: 6 mg (08/23/2018), 6 mg (09/05/2018), 6 mg (09/20/2018), 6 mg (10/03/2018), 6 mg (10/18/2018), 6 mg (11/16/2018) irinotecan (CAMPTOSAR) 360 mg in dextrose 5 % 500 mL chemo infusion, 150 mg/m2 = 360 mg (100 % of original dose 150 mg/m2), Intravenous,  Once, 7 of 8 cycles Dose modification: 150 mg/m2 (original dose 150 mg/m2, Cycle 1, Reason: Provider Judgment), 112.5 mg/m2 (original dose 150 mg/m2, Cycle 1, Reason: Change in LFTs), 90 mg/m2 (original dose 150 mg/m2, Cycle 6, Reason: Provider Judgment), 90 mg/m2 (original dose 90 mg/m2, Cycle 7) Administration: 260 mg (08/21/2018), 260 mg (09/03/2018), 260 mg (09/18/2018), 260 mg (10/01/2018), 200 mg (11/14/2018), 200 mg (10/16/2018), 200 mg (12/10/2018) leucovorin 952 mg in dextrose 5 % 250 mL infusion, 400 mg/m2 = 952 mg, Intravenous,  Once, 7 of 8 cycles Dose modification: 300 mg/m2 (original dose 400 mg/m2, Cycle 1, Reason: Provider Judgment), 300 mg/m2 (original dose 300 mg/m2, Cycle 7) Administration: 696 mg (08/21/2018), 696 mg (09/03/2018), 696 mg (09/18/2018), 676 mg (10/01/2018), 676 mg (11/14/2018), 676 mg (10/16/2018), 676 mg (12/10/2018) oxaliplatin (ELOXATIN) 155 mg in dextrose 5 % 500 mL chemo infusion, 65 mg/m2 = 155 mg (100 % of original dose 65 mg/m2), Intravenous,  Once, 7 of 8 cycles Dose modification: 65 mg/m2 (original dose 65 mg/m2, Cycle 1, Reason: Provider Judgment) Administration: 150 mg (08/21/2018), 150 mg (09/03/2018), 150 mg (09/18/2018), 150 mg (10/01/2018), 150 mg (10/16/2018), 150 mg (11/14/2018), 150 mg (12/10/2018) fosaprepitant (EMEND) 150 mg, dexamethasone (DECADRON) 12 mg in sodium chloride 0.9 % 145 mL IVPB, , Intravenous,  Once, 7 of 8 cycles Administration:  (08/21/2018),   (09/03/2018),  (09/18/2018),  (10/01/2018),  (10/16/2018),  (11/14/2018),  (12/10/2018) fluorouracil (ADRUCIL) 5,700 mg in sodium chloride 0.9 % 136 mL chemo infusion, 2,400 mg/m2 = 5,700 mg, Intravenous, 1 Day/Dose, 7 of 8 cycles Dose modification: 1,800 mg/m2 (original dose 2,400 mg/m2, Cycle 1, Reason: Change in LFTs) Administration: 4,200 mg (08/21/2018), 4,200 mg (09/03/2018), 4,200 mg (09/18/2018), 4,050 mg (10/01/2018), 4,050 mg (10/16/2018), 4,050 mg (11/14/2018), 4,050 mg (12/10/2018)  for chemotherapy treatment.       REVIEW OF SYSTEMS:   Constitutional: No fever or chills since admission. Eyes: Denies blurriness of vision Ears, nose, mouth, throat, and face: Denies mucositis or sore throat Respiratory: Denies cough, dyspnea or wheezes Cardiovascular: Denies palpitation, chest discomfort Gastrointestinal: Reports abdominal bloating but no abdominal pain, nausea, vomiting, constipation, diarrhea. Skin: Denies abnormal skin rashes Lymphatics: Denies new lymphadenopathy or easy bruising Neurological:Denies numbness, tingling or new weaknesses Behavioral/Psych: Mood is stable, no new changes  Extremities: Reports lower extremity edema. All other systems were reviewed with the patient and are negative.  I have reviewed the past medical history, past surgical history, social history and family history with the patient and they are unchanged from previous note.   PHYSICAL EXAMINATION:  Vitals:   12/31/18 2118 01/01/19 0617  BP: 139/85 138/90  Pulse: (!) 55 65  Resp: 18 20  Temp: 98.3 F (36.8 C) 98 F (36.7 C)  SpO2: 98% 97%   Filed Weights   12/28/18 1703  Weight: 195 lb 5.2 oz (88.6 kg)    Intake/Output  from previous day: 06/29 0701 - 06/30 0700 In: 1467.7 [P.O.:600; I.V.:719.1; IV Piggyback:148.6] Out: 300 [Urine:300]  GENERAL:alert, no distress and comfortable SKIN: skin color, texture, turgor are normal, no rashes or significant lesions OROPHARYNX:no exudate, no erythema and  lips, buccal mucosa, and tongue normal  NECK: supple, thyroid normal size, non-tender, without nodularity LYMPH:  no palpable lymphadenopathy in the cervical, axillary or inguinal LUNGS: clear to auscultation and percussion with normal breathing effort HEART: regular rate & rhythm and no murmurs and no lower extremity edema ABDOMEN: Positive bowel sounds.  Minimal ascites noted.  No abdominal pain with palpation. Musculoskeletal:no cyanosis of digits and no clubbing  NEURO: alert & oriented x 3 with fluent speech, no focal motor/sensory deficits  LABORATORY DATA:  I have reviewed the data as listed CMP Latest Ref Rng & Units 01/01/2019 12/31/2018 12/30/2018  Glucose 70 - 99 mg/dL 114(H) 110(H) 124(H)  BUN 8 - 23 mg/dL <5(L) 5(L) 7(L)  Creatinine 0.61 - 1.24 mg/dL 0.61 0.63 0.61  Sodium 135 - 145 mmol/L 133(L) 135 135  Potassium 3.5 - 5.1 mmol/L 3.7 3.6 3.3(L)  Chloride 98 - 111 mmol/L 102 106 106  CO2 22 - 32 mmol/L _0 Calcium 8.9 - 10.3 mg/dL 7.8(L) 7.8(L) 7.5(L)  Total Protein 6.5 - 8.1 g/dL 4.8(L) 4.8(L) 4.5(L)  Total Bilirubin 0.3 - 1.2 mg/dL 0.6 0.5 0.4  Alkaline Phos 38 - 126 U/L 522(H) 522(H) 477(H)  AST 15 - 41 U/L 40 37 45(H)  ALT 0 - 44 U/L 43 46(H) 48(H)    Lab Results  Component Value Date   WBC 2.3 (L) 01/01/2019   HGB 8.8 (L) 01/01/2019   HCT 26.9 (L) 01/01/2019   MCV 86.2 01/01/2019   PLT 128 (L) 01/01/2019   NEUTROABS 1.9 12/31/2018    Dg Chest 2 View  Result Date: 12/28/2018 CLINICAL DATA:  Sepsis EXAM: CHEST - 2 VIEW COMPARISON:  December 24, 2018 FINDINGS: The right-sided Port-A-Cath has been removed. There is a new right-sided PICC line with tip terminating over the distal SVC. The cardiac size is stable. There is no pneumothorax. No large pleural effusion. The lung volumes are slightly low. IMPRESSION: No active cardiopulmonary disease. Electronically Signed   By: Constance Holster M.D.   On: 12/28/2018 18:53   Mr Abdomen W Wo Contrast  Result Date:  12/04/2018 CLINICAL DATA:  Follow-up pancreas cancer. Assess for worsening of disease. EXAM: MRI ABDOMEN WITHOUT AND WITH CONTRAST TECHNIQUE: Multiplanar multisequence MR imaging of the abdomen was performed both before and after the administration of intravenous contrast. CONTRAST:  10 cc Gadavist COMPARISON:  11/06/2018 FINDINGS: Lower chest: Tiny bilateral pleural effusions are identified, similar to previous exam. Hepatobiliary: Multiple liver metastases are again noted. Index lesion within segment 5 measures 1.7 by 1.3 cm, image 44/1003. Previously 1.7 x 1.5 cm. The index lesion within segment 2 measures 1.0 by 0.8 cm, image 29/1003. Previously 1.2 x 0.8 cm. New lesion within segment 4 a measures 1 by 0.9 cm, image 31/1002. The remainder of the liver metastasis are relatively stable in the interval. Gallbladder unremarkable. Common bile duct stent in place without intrahepatic bile duct dilatation. Pancreas: The soft tissue mass within the head of pancreas measures approximately 3.7 x 4.1 cm, image 59/1001. Previously 3.5 x 3.1 cm. Diffuse atrophy and pancreatic ductal dilatation is again noted within the body and tail. Encasement and obstruction of the portal venous confluence is again identified with distal reconstitution of the portal vein via collaterals. Spleen:  Splenomegaly is again noted. The spleen has a cranial caudal dimension 20.1 cm. Similar. Adrenals/Urinary Tract: Normal appearance of the adrenal glands. No kidney mass or hydronephrosis. Simple appearing inferior pole right kidney cyst measures 2.7 cm. Stomach/Bowel: Visualized portions within the abdomen are unremarkable. Vascular/Lymphatic: No evidence for abdominal aortic aneurysm. Esophageal and splenic varices as well as portosystemic venous collaterals compatible with portal venous hypertension. No adenopathy identified. Other:  Interval increase in volume of abdominal ascites. Musculoskeletal: No suspicious bone lesions identified.  IMPRESSION: 1. There is been mild increase in size of mass involving head of pancreas. 2. Diffuse hepatic metastasis. The previous index lesions are unchanged in size compared with previous exam. A single new metastasis is identified within the medial segment of left lobe of liver. 3. 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 Abdominal ascites has increased in the interval. Electronically Signed   By: Kerby Moors M.D.   On: 12/04/2018 11:27   Ct Abdomen Pelvis W Contrast  Result Date: 12/24/2018 CLINICAL DATA:  Abdominal distension, fever, history of pancreatic cancer EXAM: CT ABDOMEN AND PELVIS WITH CONTRAST TECHNIQUE: Multidetector CT imaging of the abdomen and pelvis was performed using the standard protocol following bolus administration of intravenous contrast. CONTRAST:  140m OMNIPAQUE IOHEXOL 300 MG/ML  SOLN COMPARISON:  MR abdomen pelvis, 12/04/2018, 10/30/2018 FINDINGS: Lower chest: No acute abnormality. Hepatobiliary: Known liver metastases are poorly appreciated by single-phase CT, for example in the dome of the liver (series 2, image 14). No gallstones or gallbladder wall thickening. Common bile duct stent is positioned tip in the descending portion of the duodenum with post stenting pneumobilia. Pancreas: Discrete pancreatic head mass is not well appreciated, primarily appreciated by mass effect on the confluence of the portal vein (series 2, image 38. There is atrophy of the pancreatic body and tail and dilatation pancreatic duct. Spleen: Splenomegaly. Adrenals/Urinary Tract: Adrenal glands are unremarkable. Nonobstructive bilateral nephrolithiasis. Bladder is unremarkable. Stomach/Bowel: Stomach is within normal limits. Appendix is not clearly visualized. No evidence of bowel wall thickening, distention, or inflammatory changes. Large burden of stool in the distal colon and rectum. Vascular/Lymphatic: Aortic atherosclerosis. No enlarged abdominal or pelvic  lymph nodes. Reproductive: No mass or other significant abnormality. Other: No abdominal wall hernia or abnormality. Moderate volume ascites. Musculoskeletal: No acute or significant osseous findings. IMPRESSION: 1. Redemonstrated pancreatic malignancy with hepatic metastases, status post common bile duct stenting. Primary mass and hepatic metastases are better appreciated by recent multiphasic MRI. 2.  Moderate volume ascites, which is similar to prior MRI. 3.  Splenomegaly. 4.  Large burden of stool in the distal colon and rectum. 5.  Nonobstructive bilateral nephrolithiasis. Electronically Signed   By: AEddie CandleM.D.   On: 12/24/2018 14:35   Mr 3d Recon At Scanner  Result Date: 12/04/2018 CLINICAL DATA:  Follow-up pancreas cancer. Assess for worsening of disease. EXAM: MRI ABDOMEN WITHOUT AND WITH CONTRAST TECHNIQUE: Multiplanar multisequence MR imaging of the abdomen was performed both before and after the administration of intravenous contrast. CONTRAST:  10 cc Gadavist COMPARISON:  11/06/2018 FINDINGS: Lower chest: Tiny bilateral pleural effusions are identified, similar to previous exam. Hepatobiliary: Multiple liver metastases are again noted. Index lesion within segment 5 measures 1.7 by 1.3 cm, image 44/1003. Previously 1.7 x 1.5 cm. The index lesion within segment 2 measures 1.0 by 0.8 cm, image 29/1003. Previously 1.2 x 0.8 cm. New lesion within segment 4 a measures 1 by 0.9 cm, image 31/1002. The remainder of the liver metastasis are  relatively stable in the interval. Gallbladder unremarkable. Common bile duct stent in place without intrahepatic bile duct dilatation. Pancreas: The soft tissue mass within the head of pancreas measures approximately 3.7 x 4.1 cm, image 59/1001. Previously 3.5 x 3.1 cm. Diffuse atrophy and pancreatic ductal dilatation is again noted within the body and tail. Encasement and obstruction of the portal venous confluence is again identified with distal reconstitution of  the portal vein via collaterals. Spleen: Splenomegaly is again noted. The spleen has a cranial caudal dimension 20.1 cm. Similar. Adrenals/Urinary Tract: Normal appearance of the adrenal glands. No kidney mass or hydronephrosis. Simple appearing inferior pole right kidney cyst measures 2.7 cm. Stomach/Bowel: Visualized portions within the abdomen are unremarkable. Vascular/Lymphatic: No evidence for abdominal aortic aneurysm. Esophageal and splenic varices as well as portosystemic venous collaterals compatible with portal venous hypertension. No adenopathy identified. Other:  Interval increase in volume of abdominal ascites. Musculoskeletal: No suspicious bone lesions identified. IMPRESSION: 1. There is been mild increase in size of mass involving head of pancreas. 2. Diffuse hepatic metastasis. The previous index lesions are unchanged in size compared with previous exam. A single new metastasis is identified within the medial segment of left lobe of liver. 3. 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 Abdominal ascites has increased in the interval. Electronically Signed   By: Kerby Moors M.D.   On: 12/04/2018 11:27   US Paracentesis  Result Date: 12/31/2018 INDICATION: Patient with history of metastatic pancreatic cancer, ascites. Request made for diagnostic and therapeutic paracentesis. EXAM: ULTRASOUND GUIDED DIAGNOSTIC AND THERAPEUTIC PARACENTESIS MEDICATIONS: None COMPLICATIONS: None immediate. PROCEDURE: Informed written consent was obtained from the patient after a discussion of the risks, benefits and alternatives to treatment. A timeout was performed prior to the initiation of the procedure. Initial ultrasound scanning demonstrates a small to moderate amount of ascites within the right upper to mid abdominal quadrant. The right upper to mid abdomen was prepped and draped in the usual sterile fashion. 1% lidocaine was used for local anesthesia. Following this, a 19  gauge, 7-cm, Yueh catheter was introduced. An ultrasound image was saved for documentation purposes. The paracentesis was performed. The catheter was removed and a dressing was applied. The patient tolerated the procedure well without immediate post procedural complication. FINDINGS: A total of approximately 2.6 liters of slightly hazy, light yellow fluid was removed. Samples were sent to the laboratory as requested by the clinical team. IMPRESSION: Successful ultrasound-guided diagnostic and therapeutic paracentesis yielding 2.6 liters of peritoneal fluid. Read by: Rowe Robert, PA-C Electronically Signed   By: Sandi Mariscal M.D.   On: 12/31/2018 12:04   Ir Removal [employer] W/ [place] W/o Fl Mod Sed  Result Date: 12/27/2018 CLINICAL DATA:  Bacteremia. Indwelling right IJ port catheter placed 08/30/2017 by Dr. Pascal Lux, has been working well without evident complication , removal requested EXAM: EXAM TUNNELED PORT CATHETER REMOVAL TECHNIQUE: The procedure, risks (including but not limited to bleeding, infection, organ damage ), benefits, and alternatives were explained to the patient. Questions regarding the procedure were encouraged and answered. The patient understands and consents to the procedure. Intravenous Fentanyl 134mg and Versed 466mwere administered as conscious sedation during continuous monitoring of the patient's level of consciousness and physiological / cardiorespiratory status by the radiology RN, with a total moderate sedation time of 37 minutes. Overlying skin prepped with chlorhexidine, draped in usual sterile fashion, infiltrated locally with 1% lidocaine. A small incision was made over the scar from previous placement. The port catheter  was dissected free from the underlying soft tissues and removed intact. There is no gross purulence. Hemostasis was achieved. The port pocket was closed with deep interrupted and subcuticular continuous 3-0 Monocryl sutures, then covered with Dermabond. The  patient tolerated the procedure well. COMPLICATIONS: COMPLICATIONS None immediate IMPRESSION: 1.  Technically successful tunneled Port catheter removal. Electronically Signed   By: Lucrezia Europe M.D.   On: 12/27/2018 16:14   Dg Chest Port 1 View  Result Date: 12/28/2018 CLINICAL DATA:  Sepsis hypertension EXAM: PORTABLE CHEST 1 VIEW COMPARISON:  12/28/2018, 12/24/2018 FINDINGS: Right upper extremity catheter tip overlies the SVC. No acute consolidation or effusion. Normal cardiomediastinal silhouette. No pneumothorax. IMPRESSION: No active disease. Electronically Signed   By: Donavan Foil M.D.   On: 12/28/2018 23:03   Dg Chest Portable 1 View  Result Date: 12/24/2018 CLINICAL DATA:  Fever and weakness. EXAM: PORTABLE CHEST 1 VIEW COMPARISON:  11/04/2018 FINDINGS: The cardiac silhouette, mediastinal and hilar contours are stable. Mild cardiac enlargement and mild tortuosity of the thoracic aorta. The right IJ power port is stable. The lungs are clear. No pleural effusions. The bony thorax is intact. IMPRESSION: No acute cardiopulmonary findings. Electronically Signed   By: Marijo Sanes M.D.   On: 12/24/2018 13:59   Dg Ercp  Result Date: 12/06/2018 CLINICAL DATA:  63 year old male with a history of pancreatic cancer and obstructed jaundice with prior stent placement and progressive liver enzyme elevation. EXAM: ERCP TECHNIQUE: Multiple spot images obtained with the fluoroscopic device and submitted for interpretation post-procedure. FLUOROSCOPY TIME:  Fluoroscopy Time:  3 minutes 43 seconds Radiation Exposure Index (if provided by the fluoroscopic device): 66.7 mGy COMPARISON:  MRCP 12/04/2018 FINDINGS: A total of 4 intraoperative saved images are submitted for review. The images demonstrate a metal stent in the common hepatic and common bile ducts with a flexible endoscope in the descending duodenum. Subsequent images demonstrate wire cannulation of the stent and balloon dilation. IMPRESSION: ERCP with  cannulation of the previously placed metal stent and balloon dilatation. These images were submitted for radiologic interpretation only. Please see the procedural report for the amount of contrast and the fluoroscopy time utilized. Electronically Signed   By: Jacqulynn Cadet M.D.   On: 12/06/2018 11:51   [place] Ascites (abdomen Limited)  Result Date: 12/25/2018 CLINICAL DATA:  63 year old male with abdominal distension, ascites EXAM: LIMITED ABDOMEN ULTRASOUND FOR ASCITES TECHNIQUE: Limited ultrasound survey for ascites was performed in all four abdominal quadrants. COMPARISON:  Prior CT scan of the abdomen and pelvis 12/24/2018 FINDINGS: Sonographic interrogation was performed of the 4 quadrants of the abdomen. There is trace perihepatic fluid in the right upper quadrant with multiple small loops of bowel. Overall, fluid volume is low. No safe window for paracentesis. IMPRESSION: Small volume ascites, insufficient for paracentesis. Electronically Signed   By: Jacqulynn Cadet M.D.   On: 12/25/2018 12:13   Ir Picc Placement Right >5 Yrs Inc Img Guide  Result Date: 12/27/2018 CLINICAL DATA:  Bacteremia, needs venous access for planned chemotherapy Monday EXAM: PICC PLACEMENT WITH ULTRASOUND AND FLUOROSCOPY FLUOROSCOPY TIME:  6 seconds, 1 mGy TECHNIQUE: After written informed consent was obtained, patient was placed in the supine position on angiographic table. Patency of the right basilic vein was confirmed with ultrasound with image documentation. An appropriate skin site was determined. Skin site was marked. Region was prepped using maximum barrier technique including cap and mask, sterile gown, sterile gloves, large sterile sheet, and Chlorhexidine as cutaneous antisepsis. The region was infiltrated locally  with 1% lidocaine. Under real-time ultrasound guidance, the right basilic vein was accessed with a 21 gauge micropuncture needle; the needle tip within the vein was confirmed with ultrasound image  documentation. Needle exchanged over a 018 guidewire for a peel-away sheath, through which a 5-French single-lumen power injectable PICC trimmed to 46cm was advanced, positioned with its tip near the cavoatrial junction. Spot chest radiograph confirms appropriate catheter position. Catheter was flushed per protocol and secured externally. The patient tolerated procedure well. COMPLICATIONS: COMPLICATIONS none IMPRESSION: 1. Technically successful five [place] single lumen power injectable PICC placement Electronically Signed   By: Lucrezia Europe M.D.   On: 12/27/2018 16:15    ASSESSMENT AND PLAN: 1. Pancreas head mass  CT abdomen/pelvis 08/03/2017-fullness in the head of the pancreas with stranding in the peripancreatic fat and pancreatic ductal dilatation.   MRI abdomen 08/04/2017-findings favored to represent acute on chronic pancreatitis; equivocal soft tissue fullness within the head and uncinate process of the pancreas; indeterminate too small to characterize right hepatic lobe lesion; small volume abdominal ascites; splenomegaly.   CA-19-9 elevated at 977on 08/04/2017.  Status postupper EUS 08/10/2017-findings of an irregular masslike region in the pancreatic head measuring approximately 2.7 cm. This directly abutted the main portal vein but no other major vascular structures. Biopsies obtained with preliminary cytology positive for malignancy, likely adenocarcinoma. The final report is pending. The common bile duct and main pancreatic duct were both dilated. ERCP was then proceeded with forstent placement. Multiple attempts were made tocannulate the bile duct without success. The procedure was aborted.  08/16/2017 ERCP with placement of a metal biliary stent in the common bile duct by Dr. Francella Solian at [facility].  Cycle 1 FOLFIRINOX 08/31/2017  Cycle 2 FOLFIRINOX 09/14/2017  Cycle 3 FOLFIRINOX 09/28/2017  Cycle 4 FOLFIRINOX 10/12/2017 (oxaliplatin dose reduced secondary to thrombocytopenia)  Cycle  5 FOLFIRINOX 10/26/2017  Cycle 6 FOLFIRINOX 11/09/2017  Restaging CTs at [facility] 11/29/2017-no definitive evidence of distant metastatic disease. 2 subcentimeter liver lesions described on prior MRI not visualized on CT. Ill-defined pancreatic head mass stable to decreased in size measuring 2.1 x 2 cm. Peripancreatic inflammatory stranding decreased from prior. No biliary ductal dilatation. Celiac axis less than 180 degrees abutment. Common hepatic artery with greater than 180 degrees encasement; superior mesenteric artery with short segment less than 180 degrees abutment; portal vein/superior mesenteric vein with greater than 180 degrees encasement of the extrahepatic portal vein with associated circumferential narrowing at the portomesenteric, overall substantially improved from prior examination. Now less than 180 degree abutment of the SMV. The portal vein and SMV remain patent. Splenic vein patent.  Cycle 1 gemcitabine/Abraxane 12/06/2017  Cycle 2 gemcitabine/Abraxane 12/20/2017  Cycle 3 gemcitabine/Abraxane 01/03/2018  Cycle 4 gemcitabine/Abraxane 01/17/2018  Cycle 5 gemcitabine 01/31/2018 (Abraxane held due to neuropathy)  Cycle 6 gemcitabine 02/14/2018 (Abraxane held due to neuropathy)  Cycle 7 gemcitabine 02/28/2018 (Abraxane held due to neuropathy)  CT chest/abdomen/pelvis at [facility] 03/08/2018-stable appearance of previously identified infiltrating pancreatic head mass. No CT evidence of metastatic disease.  SBRT to the pancreas 04/05/2018-04/11/2018  05/09/2018 CA-19-9 2138   MRI abdomen 05/09/2018-similar 6 mm hypoenhancing focus posterior right lobe liver; more superior lesion near the dome of the liver not identified on the postcontrast imaging but there is a persistent focus of diffusion signal abnormality in this region; new 8 mm hypoenhancing focus located just superior to the gallbladder; additional tiny focus of diffusion abnormality located more superiorly without obvious  associated abnormal enhancement. Ill-defined pancreatic head mass not felt to be  significantly changed.  CT chest/abdomen/pelvis 05/10/2018-ill-defined hypoattenuating pancreatic head mass and ill-defined soft tissue abutting the celiac axis with mildly increased dilatation of the main pancreatic duct. New soft tissue nodule along the right upper lobe bronchus; hypoattenuating liver lesions concerning for metastasis.  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 The stent traverses the pancreatic mass and extends into the duodenum. Increase in size of 2 liver lesions. Pancreatic mass appears similar. Indistinct tissue planes around the duodenum along with wall thickening in the gallbladder and especially the gallbladder neck.  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 Diffuse hepatic metastasis. Previous index lesions unchanged in size. Single new metastasis identified within the medial segment left lobe of liver. 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 Abdominal ascites increased in the interval.  Cycle 7 FOLFIRINOX6/02/2019 2. Hypertension 3. Port-A-Cath placement 08/30/2017 4. History of elevated bilirubin-questionGilbert's syndrome 5. Mild leukopenia, thrombocytopenia-question secondary to portal hypertension/splenomegaly;09/14/2017 white count improved, platelet count stable;progressive thrombocytopenia following gemcitabine/Abraxane 6. Kidney stones 7. Fever following cycles 2 and 3 gemcitabine/Abraxane,? Fever related to gemcitabine 8.Jaundice, elevated liver enzymes and bilirubin 06/22/2018; status post ERCP 06/25/2018 with debris removed from the existing stent and a new plastic stent placed. Recurrent jaundice 07/19/2018 status post ERCP 07/20/2018 with finding that the previously placed plastic stent had migrated distally. There was debris and sludge on the stent. The previously placed stent was removed. 5 to 8 mm stricture just proximal to the existing SEMS. 2 plastic stents were placed.ERCP 08/16/2018-2 plastic biliary stents were removed from within the previously placed partially covered SEMS; stricture confirmed just proximal to the  previously placed SEMS, treated with placement of an 8 cm long 10 mm diameter uncovered SEMS in good position. 9.Hospitalized with bacteremia Streptococcus group C 11/04/2018 through 11/08/2018. IV ceftriaxone through 11/17/2018. TEE 11/08/2018 negative. 10.Elevated LFTs 12/03/2018-MRI abdomen 12/04/2018 with mild increase in size of mass involving head of the pancreas. Diffuse hepatic metastasis. Previous index lesions unchanged in size. Single new metastasis identified within the medial segment left lobe of liver. Stigmata of  portal venous hypertension including splenomegaly and varices. Abdominal ascites increased in the interval. 12/06/2018 ERCP-previously placed uncovered 8 cm long metal biliary stent nearly filled with bio debris and flecks of tissue ingrowth. Stent cleared. 11. 12/24/18 -admission with sepsis syndrome, blood culture from right arm positive for Streptococcus Anginosis, blood culture from Port-A-Cath with a Bacteroides species 12.  12/28/2018-readmission with sepsis syndrome  ASSESSMENT/PLAN: Mr. Utsey appears stable. He now has candida in addition to Streptococcus anginosis and Bacteroides thetaiotaomicron in his blood cultures. ID is following closely.  He is s/p paracentesis for ascites. Fluid has been sent for diagnostic studies.  He has mild pancytopenia which is stable and likely due to his recent chemotherapy.  1.  Continue IV antibiotics and antifungal per ID.  Appreciate their assistance.  2.  Monitor CBC.  No transfusion is indicated at this time. 3. Will need to place chemotherapy on hold while he is receiving IV antibiotics and antifungal. 6 week of antibiotics are recommended.    LOS: 4 days   Mikey Bussing, DNP, AGPCNP-BC, AOCNP 01/01/19  Mr. Dupee appears well.  He has been diagnosed with polymicrobial bacteremia.  I appreciate the consult from Dr. Megan Salon.  It is possible some of the liver lesions noted on recent imaging studies represent  abscesses.  Chemotherapy will remain on hold until he is stable on outpatient antibiotics, as negative repeat cultures, and can have a new IV access placed.  Julieanne Manson, MD

## 2019-01-02 DIAGNOSIS — R188 Other ascites: Secondary | ICD-10-CM

## 2019-01-02 DIAGNOSIS — A498 Other bacterial infections of unspecified site: Secondary | ICD-10-CM

## 2019-01-02 DIAGNOSIS — B377 Candidal sepsis: Secondary | ICD-10-CM

## 2019-01-02 DIAGNOSIS — C254 Malignant neoplasm of endocrine pancreas: Secondary | ICD-10-CM

## 2019-01-02 LAB — FOLATE RBC
Folate, Hemolysate: 507 ng/mL
Folate, RBC: UNDETERMINED ng/mL

## 2019-01-02 LAB — CBC
HCT: 30.4 % — ABNORMAL LOW (ref 39.0–52.0)
Hemoglobin: 9.8 g/dL — ABNORMAL LOW (ref 13.0–17.0)
MCH: 28.3 pg (ref 26.0–34.0)
MCHC: 32.2 g/dL (ref 30.0–36.0)
MCV: 87.9 fL (ref 80.0–100.0)
Platelets: 131 10*3/uL — ABNORMAL LOW (ref 150–400)
RBC: 3.46 MIL/uL — ABNORMAL LOW (ref 4.22–5.81)
RDW: 15.2 % (ref 11.5–15.5)
WBC: 3.5 10*3/uL — ABNORMAL LOW (ref 4.0–10.5)
nRBC: 0 % (ref 0.0–0.2)

## 2019-01-02 LAB — MAGNESIUM: Magnesium: 1.8 mg/dL (ref 1.7–2.4)

## 2019-01-02 LAB — CULTURE, BLOOD (ROUTINE X 2)
Culture: NO GROWTH
Special Requests: ADEQUATE

## 2019-01-02 LAB — CREATININE, SERUM
Creatinine, Ser: 0.55 mg/dL — ABNORMAL LOW (ref 0.61–1.24)
GFR calc Af Amer: >60 mL/min (ref >60)
GFR calc non Af Amer: >60 mL/min (ref >60)

## 2019-01-02 LAB — C DIFFICILE QUICK SCREEN W PCR REFLEX
C Diff antigen: NEGATIVE
C Diff interpretation: NOT DETECTED
C Diff toxin: NEGATIVE

## 2019-01-02 LAB — PROCALCITONIN: Procalcitonin: 2.09 ng/mL

## 2019-01-02 NOTE — Progress Notes (Signed)
C diff negative, enteric discontinued as per protocol.

## 2019-01-02 NOTE — Progress Notes (Signed)
Patient ID: Cory Kelly, male   DOB: 11/09/1955, 63 y.o.   MRN: [id number]         [facility]  Date of Admission:  12/28/2018   Total days of antibiotics 10        Day 2 ertapenem        Day 2 anidulafungin         ASSESSMENT: Cory Kelly has had recurring admissions over the past 2 months with polymicrobial bloodstream infection.  He had group C strep bacteremia in early May treated with 2 weeks of ceftriaxone.  He was readmitted last week with strep anginosis and Bacteroides bacteremia.  He was readmitted on 12/28/2018 with fever.  He defervesced promptly with empiric broadening of his antibacterial therapy.  Recently 1 of 2 admission blood cultures was reported to be growing Candida glabrata from a peripheral stick blood culture.  I suspect that his infections are coming from a hepatobiliary source (either his bare metal stent or his known malignancy in this region disrupting the normal flora and allowing it to invade the vasculature).  Repeat blood cultures are showing NGTD thus far but would not expect growth until 48-72 hrs with this pathogen.  His PICC line was removed on 01/01/2019 and would wait to insert a new PICC until blood cultures are negative after 72 hrs.  Antimicrobial susceptibility testing of his Candida glabrata is pending (30% or more of isolates for this pathogen are azole resistant typically). Anidulafungin is usually sensitive, but we are trying to avoid a scenario where the patient must receive 2 IV antimicrobials unless absolutely necessary. Based on current guidelines he should have an ophthalmologic exam sometime within the next 2 weeks to rule out endophthalmitis and help guide duration of antifungal therapy. Dr. Hale Bogus discussion with radiology yesterday about his recent abdominal CT and MRI scans were inconclusive on identifying his precise source of infection, so his risk for further recurrences remains quite high over time. His recent CDT  was negative for Ag and toxin, making CDI unlikely. Suspect his watery diarrhea is more pancreatic in nature.  PLAN: 1. Continue ertapenem and anidulafungin. As stated above, duration of treatment is unclear at this time. As his repeat echo showed no abnormalities, he would likley be best served with 4 weeks of ertapenem and 6 weeks of anidulafungin from time of established clearance of blood borne pathogen. 2. Removed PICC...delay placement of new PICC after repeat blood cxs are negative at 72 hrs or greater. 3. f/u antifungal susceptibilities of his Candida glabrata isolate 4. Recommend ophthalmologic evaluation 5. Will need to discuss nature of chemo re-initiation with his oncologist as currently on hold due to his infection. If future chemo is immunosuppressive or likely to induce neutropenia, premature resumption of chemo could worsen prognosis (fungemia alone carries a 50% mortality risk). He seems unwilling to consider palliation at this time. I am unclear of his oncologic prognosis, so will defer this discussion to them.  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[place])   Scheduled Meds:  amLODipine  10 mg Oral QHS   calamine   Topical BID   doxazosin  1 mg Oral QHS   enoxaparin (LOVENOX) injection  40 mg Subcutaneous Daily   ferrous sulfate  325 mg Oral BID WC   gabapentin  200 mg Oral BID  irbesartan  300 mg Oral Daily   potassium chloride SA  20 mEq Oral BID   Continuous Infusions:  sodium chloride 75 mL/hr at 01/02/19 0600   anidulafungin 100 mg (01/01/19 2215)   ertapenem 1,000 mg (01/01/19 1929)   PRN Meds:.acetaminophen **OR** acetaminophen, albuterol, alum & mag hydroxide-simeth, bisacodyl, guaiFENesin-dextromethorphan, hydrALAZINE, hydrocortisone, hydrocortisone cream, ipratropium, lip balm,  loratadine, magnesium citrate, Muscle Rub, ondansetron **OR** ondansetron (ZOFRAN) IV, oxyCODONE, oxyCODONE-acetaminophen **AND** oxyCODONE, phenol, polyethylene glycol, polyvinyl alcohol, prochlorperazine, senna-docusate, sodium chloride, sodium chloride flush, zolpidem   SUBJECTIVE: Watery diarrhea improving somewhat.  He has had 2 episodes so far today.  Appetite remains poor but he has been ambulating.  Review of Systems: Review of Systems  Constitutional: Negative for chills, diaphoresis and fever.  Respiratory: Negative for cough and shortness of breath.   Cardiovascular: Negative for chest pain.  Gastrointestinal: Positive for abdominal pain and diarrhea. Negative for nausea and vomiting.    No Known Allergies  OBJECTIVE: Vitals:   01/01/19 0617 01/01/19 1400 01/01/19 2040 01/02/19 0559  BP: 138/90 138/82 137/86 111/72  Pulse: 65 63 61 63  Resp: 20 18 18 16   Temp: 98 F (36.7 C) 98.2 F (36.8 C) 98.8 F (37.1 C) 98.2 F (36.8 C)  TempSrc: Oral Oral Oral Oral  SpO2: 97% 98% 97% 95%  Weight:      Height:       Body mass index is 23.16 kg/m.  Physical Exam Gen: pleasant, NAD, A&Ox 3 Head: NCAT, no temporal wasting evident EENT: PERRL, EOMI, MMM, adequate dentition Neck: supple, no JVD CV: NRRR, no murmurs evident Pulm: CTA bilaterally, no wheeze or retractions Abd: soft, NTND, +BS Extrems: 1+ LE edema, 2+ pulses Skin: no rashes, adequate skin turgor Neuro: CN II-XII grossly intact, no focal neurologic deficits appreciated, gait was not assessed, A&Ox 3  Lab Results Lab Results  Component Value Date   WBC 3.5 (L) 01/02/2019   HGB 9.8 (L) 01/02/2019   HCT 30.4 (L) 01/02/2019   MCV 87.9 01/02/2019   PLT 131 (L) 01/02/2019    Lab Results  Component Value Date   CREATININE 0.55 (L) 01/02/2019   BUN <5 (L) 01/01/2019   NA 133 (L) 01/01/2019   K 3.7 01/01/2019   CL 102 01/01/2019   CO2 24 01/01/2019    Lab Results  Component Value Date   ALT 43  01/01/2019   AST 40 01/01/2019   ALKPHOS 522 (H) 01/01/2019   BILITOT 0.6 01/01/2019     Microbiology: Recent Results (from the past 240 hour(s))  Culture, Blood     Status: Abnormal   Collection Time: 12/24/18 11:25 AM   Specimen: BLOOD RIGHT ARM  Result Value Ref Range Status   Specimen Description   Final    BLOOD RIGHT ARM Performed at [facility], [address], [place], [place] [address]    Special Requests   Final    BOTTLES DRAWN AEROBIC AND ANAEROBIC Blood Culture adequate volume   Culture  Setup Time   Final    AEROBIC BOTTLE ONLY GRAM POSITIVE COCCI CRITICAL RESULT CALLED TO, READ BACK BY AND VERIFIED WITH: J LEGGE PHARMD 12/25/18 2030 JDW GRAM VARIABLE ROD ANAEROBIC BOTTLE ONLY CRITICAL RESULT CALLED TO, READ BACK BY AND VERIFIED WITH: Damian Leavell [id number] Performed at [facility], [place] [address], [place], [place] [address]    Culture STREPTOCOCCUS ANGINOSIS (A)  Final   Report Status 12/28/2018 FINAL  Final  Organism ID, Bacteria STREPTOCOCCUS ANGINOSIS  Final      Susceptibility   Streptococcus anginosis - MIC*    PENICILLIN 0.12 SENSITIVE Sensitive     CEFTRIAXONE 0.5 SENSITIVE Sensitive     ERYTHROMYCIN <=0.12 SENSITIVE Sensitive     LEVOFLOXACIN <=0.25 SENSITIVE Sensitive     VANCOMYCIN 0.5 SENSITIVE Sensitive     * STREPTOCOCCUS ANGINOSIS  Blood Culture ID Panel (Reflexed)     Status: Abnormal   Collection Time: 12/24/18 11:25 AM  Result Value Ref Range Status   Enterococcus species NOT DETECTED NOT DETECTED Final   Listeria monocytogenes NOT DETECTED NOT DETECTED Final   Staphylococcus species NOT DETECTED NOT DETECTED Final   Staphylococcus aureus (BCID) NOT DETECTED NOT DETECTED Final   Streptococcus species DETECTED (A) NOT DETECTED Final    Comment: Not Enterococcus species, Streptococcus agalactiae, Streptococcus pyogenes, or Streptococcus pneumoniae. CRITICAL RESULT CALLED TO, READ BACK BY AND  VERIFIED WITH: J LEGGE PHARMD 12/25/18 2030 JDW    Streptococcus agalactiae NOT DETECTED NOT DETECTED Final   Streptococcus pneumoniae NOT DETECTED NOT DETECTED Final   Streptococcus pyogenes NOT DETECTED NOT DETECTED Final   Acinetobacter baumannii NOT DETECTED NOT DETECTED Final   Enterobacteriaceae species NOT DETECTED NOT DETECTED Final   Enterobacter cloacae complex NOT DETECTED NOT DETECTED Final   Escherichia coli NOT DETECTED NOT DETECTED Final   Klebsiella oxytoca NOT DETECTED NOT DETECTED Final   Klebsiella pneumoniae NOT DETECTED NOT DETECTED Final   Proteus species NOT DETECTED NOT DETECTED Final   Serratia marcescens NOT DETECTED NOT DETECTED Final   Haemophilus influenzae NOT DETECTED NOT DETECTED Final   Neisseria meningitidis NOT DETECTED NOT DETECTED Final   Pseudomonas aeruginosa NOT DETECTED NOT DETECTED Final   Candida albicans NOT DETECTED NOT DETECTED Final   Candida glabrata NOT DETECTED NOT DETECTED Final   Candida krusei NOT DETECTED NOT DETECTED Final   Candida parapsilosis NOT DETECTED NOT DETECTED Final   Candida tropicalis NOT DETECTED NOT DETECTED Final    Comment: Performed at [facility], [place] [address], [place], [place] [address]  Culture, Blood     Status: Abnormal   Collection Time: 12/24/18 12:09 PM   Specimen: Porta Cath; Blood  Result Value Ref Range Status   Specimen Description   Final    PORTA CATH Performed at [facility], [place] [address], [place], [place] [address]    Special Requests   Final    NONE Performed at [facility], [place] [address], [place], [place] [address]    Culture  Setup Time   Final    GRAM NEGATIVE RODS ANAEROBIC BOTTLE ONLY CRITICAL RESULT CALLED TO, READ BACK BY AND VERIFIED WITH: [place] Slider PharmD 12:00 12/27/18 ([email or web address])    Culture (A)  Final    BACTEROIDES THETAIOTAOMICRON BETA LACTAMASE POSITIVE Performed at [facility], [place] [address]  [place], [place] [address]    Report Status 12/31/2018 FINAL  Final  Blood Culture ID Panel (Reflexed)     Status: None   Collection Time: 12/24/18 12:09 PM  Result Value Ref Range Status   Enterococcus species NOT DETECTED NOT DETECTED Final   Listeria monocytogenes NOT DETECTED NOT DETECTED Final   Staphylococcus species NOT DETECTED NOT DETECTED Final   Staphylococcus aureus (BCID) NOT DETECTED NOT DETECTED Final   Streptococcus species NOT DETECTED NOT DETECTED Final   Streptococcus agalactiae NOT DETECTED NOT DETECTED Final   Streptococcus pneumoniae NOT DETECTED NOT DETECTED Final  Streptococcus pyogenes NOT DETECTED NOT DETECTED Final   Acinetobacter baumannii NOT DETECTED NOT DETECTED Final   Enterobacteriaceae species NOT DETECTED NOT DETECTED Final   Enterobacter cloacae complex NOT DETECTED NOT DETECTED Final   Escherichia coli NOT DETECTED NOT DETECTED Final   Klebsiella oxytoca NOT DETECTED NOT DETECTED Final   Klebsiella pneumoniae NOT DETECTED NOT DETECTED Final   Proteus species NOT DETECTED NOT DETECTED Final   Serratia marcescens NOT DETECTED NOT DETECTED Final   Haemophilus influenzae NOT DETECTED NOT DETECTED Final   Neisseria meningitidis NOT DETECTED NOT DETECTED Final   Pseudomonas aeruginosa NOT DETECTED NOT DETECTED Final   Candida albicans NOT DETECTED NOT DETECTED Final   Candida glabrata NOT DETECTED NOT DETECTED Final   Candida krusei NOT DETECTED NOT DETECTED Final   Candida parapsilosis NOT DETECTED NOT DETECTED Final   Candida tropicalis NOT DETECTED NOT DETECTED Final    Comment: Performed at [facility], [place] [address] [place], [place] [address]  Blood Culture ID Panel (Reflexed)     Status: None   Collection Time: 12/24/18 12:27 PM  Result Value Ref Range Status   Enterococcus species NOT DETECTED NOT DETECTED Final   Listeria monocytogenes NOT DETECTED NOT DETECTED Final   Staphylococcus species NOT DETECTED NOT DETECTED Final    Staphylococcus aureus (BCID) NOT DETECTED NOT DETECTED Final   Streptococcus species NOT DETECTED NOT DETECTED Final   Streptococcus agalactiae NOT DETECTED NOT DETECTED Final   Streptococcus pneumoniae NOT DETECTED NOT DETECTED Final   Streptococcus pyogenes NOT DETECTED NOT DETECTED Final   Acinetobacter baumannii NOT DETECTED NOT DETECTED Final   Enterobacteriaceae species NOT DETECTED NOT DETECTED Final   Enterobacter cloacae complex NOT DETECTED NOT DETECTED Final   Escherichia coli NOT DETECTED NOT DETECTED Final   Klebsiella oxytoca NOT DETECTED NOT DETECTED Final   Klebsiella pneumoniae NOT DETECTED NOT DETECTED Final   Proteus species NOT DETECTED NOT DETECTED Final   Serratia marcescens NOT DETECTED NOT DETECTED Final   Haemophilus influenzae NOT DETECTED NOT DETECTED Final   Neisseria meningitidis NOT DETECTED NOT DETECTED Final   Pseudomonas aeruginosa NOT DETECTED NOT DETECTED Final   Candida albicans NOT DETECTED NOT DETECTED Final   Candida glabrata NOT DETECTED NOT DETECTED Final   Candida krusei NOT DETECTED NOT DETECTED Final   Candida parapsilosis NOT DETECTED NOT DETECTED Final   Candida tropicalis NOT DETECTED NOT DETECTED Final    Comment: Performed at [facility], [place] [address] [place], [place] [address]  Urine culture     Status: None   Collection Time: 12/24/18  1:28 PM   Specimen: Urine, Clean Catch  Result Value Ref Range Status   Specimen Description   Final    URINE, CLEAN CATCH Performed at [facility], [place] [address] [place], [place] [address]    Special Requests   Final    NONE Performed at [facility], [place] [address] [place], [place] [address]    Culture   Final    NO GROWTH Performed at [facility], [place] [address] [place],  [address]    Report Status 12/25/2018 FINAL  Final  SARS Coronavirus 2 (CEPHEID - Performed in [place] [facility] lab), Hosp Order     Status: None    Collection Time: 12/24/18  1:28 PM   Specimen: Nasopharyngeal Swab  Result Value Ref Range Status   SARS Coronavirus 2 NEGATIVE NEGATIVE Final    Comment: (NOTE) If result is  NEGATIVE SARS-CoV-2 target nucleic acids are NOT DETECTED. The SARS-CoV-2 RNA is generally detectable in upper and lower  respiratory specimens during the acute phase of infection. The lowest  concentration of SARS-CoV-2 viral copies this assay can detect is 250  copies / mL. A negative result does not preclude SARS-CoV-2 infection  and should not be used as the sole basis for treatment or other  patient management decisions.  A negative result may occur with  improper specimen collection / handling, submission of specimen other  than nasopharyngeal swab, presence of viral mutation(s) within the  areas targeted by this assay, and inadequate number of viral copies  (<250 copies / mL). A negative result must be combined with clinical  observations, patient history, and epidemiological information. If result is POSITIVE SARS-CoV-2 target nucleic acids are DETECTED. The SARS-CoV-2 RNA is generally detectable in upper and lower  respiratory specimens dur ing the acute phase of infection.  Positive  results are indicative of active infection with SARS-CoV-2.  Clinical  correlation with patient history and other diagnostic information is  necessary to determine patient infection status.  Positive results do  not rule out bacterial infection or co-infection with other viruses. If result is PRESUMPTIVE POSTIVE SARS-CoV-2 nucleic acids MAY BE PRESENT.   A presumptive positive result was obtained on the submitted specimen  and confirmed on repeat testing.  While 2019 novel coronavirus  (SARS-CoV-2) nucleic acids may be present in the submitted sample  additional confirmatory testing may be necessary for epidemiological  and / or clinical management purposes  to differentiate between  SARS-CoV-2 and other Sarbecovirus  currently known to infect humans.  If clinically indicated additional testing with an alternate test  methodology [phone]) is advised. The SARS-CoV-2 RNA is generally  detectable in upper and lower respiratory sp ecimens during the acute  phase of infection. The expected result is Negative. Fact Sheet for Patients:  StrictlyIdeas.no Fact Sheet for Healthcare Providers: BankingDealers.co.za This test is not yet approved or cleared by the [place] FDA and has been authorized for detection and/or diagnosis of SARS-CoV-2 by FDA under an Emergency Use Authorization (EUA).  This EUA will remain in effect (meaning this test can be used) for the duration of the COVID-19 declaration under Section 564(b)(1) of the Act, 21 U.S.C. section 360bbb-3(b)(1), unless the authorization is terminated or revoked sooner. Performed at [facility], [place] [address], [place], [place] [address]   Culture, blood (routine x 2)     Status: None   Collection Time: 12/24/18  5:22 PM   Specimen: BLOOD  Result Value Ref Range Status   Specimen Description   Final    BLOOD LEFT ANTECUBITAL Performed at [facility], [place] [address], [place], [place] [address]    Special Requests   Final    BOTTLES DRAWN AEROBIC AND ANAEROBIC Blood Culture results may not be optimal due to an excessive volume of blood received in culture bottles Performed at [place] [address] [place], [place] [address]    Culture   Final    NO GROWTH 5 DAYS Performed at [facility], [place] [address], [place], [place] [address]    Report Status 12/29/2018 FINAL  Final  Culture, blood (routine x 2)     Status: None   Collection Time: 12/24/18  5:29 PM   Specimen: BLOOD  Result Value Ref Range Status   Specimen Description   Final    BLOOD RIGHT ANTECUBITAL Performed  at [facility], [place] [address] [place], [place] [address]      Special Requests   Final    BOTTLES DRAWN AEROBIC AND ANAEROBIC Blood Culture adequate volume Performed at [place] [address] [place], [place] [address]    Culture   Final    NO GROWTH 5 DAYS Performed at [facility], [place] [address], [place], [place] [address]    Report Status 12/29/2018 FINAL  Final  Culture, blood (Routine x 2)     Status: Abnormal (Preliminary result)   Collection Time: 12/28/18  5:50 PM   Specimen: BLOOD LEFT FOREARM  Result Value Ref Range Status   Specimen Description   Final    BLOOD LEFT FOREARM Performed at [facility], [place] [address], [place], [place] [address]    Special Requests   Final    BOTTLES DRAWN AEROBIC AND ANAEROBIC Blood Culture adequate volume Performed at [place] [address], [place], [place] [address]    Culture  Setup Time   Final    IN BOTH AEROBIC AND ANAEROBIC BOTTLES YEAST CRITICAL RESULT CALLED TO, READ BACK BY AND VERIFIED WITH: Sheffield Slider [facility] 12/31/18 2103 JDW    Culture (A)  Final    CANDIDA GLABRATA Sent to [place] for further susceptibility testing. Performed at [facility], [place] [address], [place], [place] [address]    Report Status PENDING  Incomplete  Culture, blood (Routine x 2)     Status: None   Collection Time: 12/28/18  5:50 PM   Specimen: BLOOD  Result Value Ref Range Status   Specimen Description   Final    BLOOD RIGHT MIDLINE Performed at [place] [address], [place], [place] [address]    Special Requests   Final    BOTTLES DRAWN AEROBIC AND ANAEROBIC Blood Culture adequate volume Performed at [place] [address] [place], [place] [address]    Culture   Final    NO GROWTH 5 DAYS Performed at [facility], [place] [address] [place],  [address]    Report Status 01/02/2019 FINAL  Final  Blood Culture ID Panel (Reflexed)     Status: Abnormal   Collection  Time: 12/28/18  5:50 PM  Result Value Ref Range Status   Enterococcus species NOT DETECTED NOT DETECTED Final   Listeria monocytogenes NOT DETECTED NOT DETECTED Final   Staphylococcus species NOT DETECTED NOT DETECTED Final   Staphylococcus aureus (BCID) NOT DETECTED NOT DETECTED Final   Streptococcus species NOT DETECTED NOT DETECTED Final   Streptococcus agalactiae NOT DETECTED NOT DETECTED Final   Streptococcus pneumoniae NOT DETECTED NOT DETECTED Final   Streptococcus pyogenes NOT DETECTED NOT DETECTED Final   Acinetobacter baumannii NOT DETECTED NOT DETECTED Final   Enterobacteriaceae species NOT DETECTED NOT DETECTED Final   Enterobacter cloacae complex NOT DETECTED NOT DETECTED Final   Escherichia coli NOT DETECTED NOT DETECTED Final   Klebsiella oxytoca NOT DETECTED NOT DETECTED Final   Klebsiella pneumoniae NOT DETECTED NOT DETECTED Final   Proteus species NOT DETECTED NOT DETECTED Final   Serratia marcescens NOT DETECTED NOT DETECTED Final   Haemophilus influenzae NOT DETECTED NOT DETECTED Final   Neisseria meningitidis NOT DETECTED NOT DETECTED Final   Pseudomonas aeruginosa NOT DETECTED NOT DETECTED Final   Candida albicans NOT DETECTED NOT DETECTED Final   Candida glabrata DETECTED (A) NOT DETECTED Final    Comment: CRITICAL  RESULT CALLED TO, READ BACK BY AND VERIFIED WITH: Sheffield Slider [facility] 12/31/18 2103 JDW    Candida krusei NOT DETECTED NOT DETECTED Final   Candida parapsilosis NOT DETECTED NOT DETECTED Final   Candida tropicalis NOT DETECTED NOT DETECTED Final    Comment: Performed at [facility], [address], [place],  [address]  SARS Coronavirus 2 (CEPHEID - Performed in [place] hospital lab), Hosp Order     Status: None   Collection Time: 12/28/18  5:52 PM   Specimen: Nasopharyngeal Swab  Result Value Ref Range Status   SARS Coronavirus 2 NEGATIVE NEGATIVE Final    Comment: (NOTE) If result is NEGATIVE SARS-CoV-2 target nucleic acids are  NOT DETECTED. The SARS-CoV-2 RNA is generally detectable in upper and lower  respiratory specimens during the acute phase of infection. The lowest  concentration of SARS-CoV-2 viral copies this assay can detect is 250  copies / mL. A negative result does not preclude SARS-CoV-2 infection  and should not be used as the sole basis for treatment or other  patient management decisions.  A negative result may occur with  improper specimen collection / handling, submission of specimen other  than nasopharyngeal swab, presence of viral mutation(s) within the  areas targeted by this assay, and inadequate number of viral copies  (<250 copies / mL). A negative result must be combined with clinical  observations, patient history, and epidemiological information. If result is POSITIVE SARS-CoV-2 target nucleic acids are DETECTED. The SARS-CoV-2 RNA is generally detectable in upper and lower  respiratory specimens dur ing the acute phase of infection.  Positive  results are indicative of active infection with SARS-CoV-2.  Clinical  correlation with patient history and other diagnostic information is  necessary to determine patient infection status.  Positive results do  not rule out bacterial infection or co-infection with other viruses. If result is PRESUMPTIVE POSTIVE SARS-CoV-2 nucleic acids MAY BE PRESENT.   A presumptive positive result was obtained on the submitted specimen  and confirmed on repeat testing.  While 2019 novel coronavirus  (SARS-CoV-2) nucleic acids may be present in the submitted sample  additional confirmatory testing may be necessary for epidemiological  and / or clinical management purposes  to differentiate between  SARS-CoV-2 and other Sarbecovirus currently known to infect humans.  If clinically indicated additional testing with an alternate test  methodology [phone]) is advised. The SARS-CoV-2 RNA is generally  detectable in upper and lower respiratory sp ecimens  during the acute  phase of infection. The expected result is Negative. Fact Sheet for Patients:  StrictlyIdeas.no Fact Sheet for Healthcare Providers: BankingDealers.co.za This test is not yet approved or cleared by the [place] FDA and has been authorized for detection and/or diagnosis of SARS-CoV-2 by FDA under an Emergency Use Authorization (EUA).  This EUA will remain in effect (meaning this test can be used) for the duration of the COVID-19 declaration under Section 564(b)(1) of the Act, 21 U.S.C. section 360bbb-3(b)(1), unless the authorization is terminated or revoked sooner. Performed at [facility], [place] [address] [place],  [address]   Culture, body fluid-bottle     Status: None (Preliminary result)   Collection Time: 12/31/18 12:51 PM   Specimen: Peritoneal Washings  Result Value Ref Range Status   Specimen Description PERITONEAL  Final   Special Requests NONE  Final   Culture   Final    NO GROWTH 2 DAYS Performed at [facility], [address],  [place], [place] [address]    Report Status PENDING  Incomplete  Gram stain     Status: None   Collection Time: 12/31/18 12:51 PM   Specimen: Peritoneal Washings  Result Value Ref Range Status   Specimen Description PERITONEAL  Final   Special Requests NONE  Final   Gram Stain   Final    NO WBC SEEN NO ORGANISMS SEEN Performed at [facility], [address] [place], [place] [address]    Report Status 12/31/2018 FINAL  Final  Culture, blood (routine x 2)     Status: None (Preliminary result)   Collection Time: 12/31/18  9:54 PM   Specimen: BLOOD  Result Value Ref Range Status   Specimen Description   Final    BLOOD LEFT ANTECUBITAL Performed at [facility] [address] [place], [place] [address]    Special Requests   Final    BOTTLES DRAWN AEROBIC AND ANAEROBIC Blood Culture adequate volume Performed  at [facility] [address] [place], [place] [address]    Culture   Final    NO GROWTH 2 DAYS Performed at [facility] [address] [place], [place] [address]    Report Status PENDING  Incomplete  Culture, blood (routine x 2)     Status: None (Preliminary result)   Collection Time: 12/31/18  9:54 PM   Specimen: BLOOD  Result Value Ref Range Status   Specimen Description   Final    BLOOD BLOOD RIGHT HAND Performed at [facility] [address], [place], [place] [address]    Special Requests   Final    BOTTLES DRAWN AEROBIC AND ANAEROBIC Blood Culture adequate volume Performed at [facility] [address], [place], [place] [address]    Culture   Final    NO GROWTH 2 DAYS Performed at [facility] [address] [place], [place] [address]    Report Status PENDING  Incomplete  C difficile quick scan w PCR reflex     Status: None   Collection Time: 01/01/19 11:33 AM   Specimen: STOOL  Result Value Ref Range Status   C Diff antigen NEGATIVE NEGATIVE Final   C Diff toxin NEGATIVE NEGATIVE Final   C Diff interpretation No C. difficile detected.  Final    Comment: Performed at [facility], [place] [address], [place], [place] [address]    Stpehanie Montroy N Bobak Oguinn, [place] [facility] [place] [phone] pager   [phone] cell 01/02/2019, 2:19 PM

## 2019-01-02 NOTE — Progress Notes (Signed)
[facility]                                                                              Patient Demographics  Cory Kelly, is a 63 y.o. male, DOB - 1956-04-06, KWI:[id number]  Admit date - 12/28/2018   Admitting Physician Harvie Bridge, DO  Outpatient Primary MD for the patient is Gale Journey, Damaris Hippo, PA-C  Outpatient specialists:   LOS - 5  days   Medical records reviewed and are as summarized below:    Chief Complaint  Patient presents with   Fever   Abnormal Lab   Cancer patient       Brief summary   63 year old with history of metastatic pancreatic cancer status post biliary stent, essential hypertension, hyperlipidemia returned back to the hospital with fevers and chills.  He was recently discharged here from the hospital after being treated for gram-negative and Streptococcus bacteremia with IV Rocephin.  Upon readmission was started on broad-spectrum antibiotics.  ID recommended ertapenem but his cultures also started growing Candida therefore started on antifungal. S/p Paracentesis on 6/29.    Assessment & Plan    Principal problem Sepsis, POA, Streptococcus bacteremia,, Candida fungemia -Blood cultures 6-29 showed Candida glabrata -Patient has recurrent admissions for the past 2 months with polymicrobial bacteremia.   - Patient was placed on ertapenem and anidulafungin -Status post paracentesis 6/29 cultures negative so far.  No abdominal tenderness -ID consulted, recommended removing PICC line.  C. difficile screen is negative. -2D echo showed EF of 55 to 60%, limited echo to evaluate for possible endocarditis, no evidence of valvular vegetation. -Given Candida bacteremia, ophthalmology consulted, Dr. Wyatt Portela to see the patient today   Active problems Normocytic anemia, iron deficiency -Likely from anemia of chronic disease, metastatic pancreatic CA  History of pancreatic CA with metastasis -Oncology following, Dr. Learta Codding,  chemotherapy will remain on hold due to sepsis -Port-A-Cath was removed previous admission, will need new IV access prior to starting chemotherapy  Constipation -Continue bowel regimen  Nonobstructive bilateral kidney stones -Currently no pain, renal function normal  Peripheral neuropathy -Continue gabapentin   Essential hypertension BP currently stable  Code Status: Full code* DVT Prophylaxis:  Lovenox  Family Communication: Discussed in detail with the patient, all imaging results, lab results explained to the patient   Disposition Plan: Not medically ready, needs to streamline antibiotics, needs ophthalmology examination, will likely need PICC line for long-term antibiotics  Time Spent in minutes 40 minutes  Procedures:  None  Consultants:   Infectious disease Oncology Ophthalmology  Antimicrobials:   Anti-infectives (From admission, onward)   Start     Dose/Rate Route Frequency Ordered Stop   01/01/19 2200  anidulafungin (ERAXIS) 100 mg in sodium chloride 0.9 % 100 mL IVPB     100 mg 78 mL/hr over 100 Minutes Intravenous Every 24 hours 12/31/18 2134     12/31/18 2200  anidulafungin (ERAXIS) 200 mg in sodium chloride 0.9 % 200 mL IVPB     200 mg 78 mL/hr over 200 Minutes Intravenous  Once 12/31/18 2134 01/01/19 0142   12/31/18 1800  ertapenem (INVANZ) 1,000 mg in sodium chloride 0.9 %  100 mL IVPB     1 g 200 mL/hr over 30 Minutes Intravenous Every 24 hours 12/31/18 1700     12/30/18 1200  vancomycin (VANCOCIN) 1,500 mg in sodium chloride 0.9 % 500 mL IVPB  Status:  Discontinued     1,500 mg 250 mL/hr over 120 Minutes Intravenous 2 times daily 12/30/18 1153 12/31/18 1700   12/29/18 0600  metroNIDAZOLE (FLAGYL) IVPB 500 mg  Status:  Discontinued     500 mg 100 mL/hr over 60 Minutes Intravenous Every 8 hours 12/28/18 2237 12/31/18 1700   12/29/18 0200  vancomycin (VANCOCIN) 1,500 mg in sodium chloride 0.9 % 500 mL IVPB  Status:  Discontinued     1,500 mg 250  mL/hr over 120 Minutes Intravenous Every 12 hours 12/28/18 2010 12/30/18 1143   12/29/18 0200  ceFEPIme (MAXIPIME) 2 g in sodium chloride 0.9 % 100 mL IVPB  Status:  Discontinued     2 g 200 mL/hr over 30 Minutes Intravenous Every 8 hours 12/28/18 2010 12/31/18 1700   12/28/18 2245  ceFEPIme (MAXIPIME) 2 g in sodium chloride 0.9 % 100 mL IVPB  Status:  Discontinued     2 g 200 mL/hr over 30 Minutes Intravenous  Once 12/28/18 2237 12/28/18 2241   12/28/18 2245  vancomycin (VANCOCIN) IVPB 1000 mg/200 mL premix  Status:  Discontinued     1,000 mg 200 mL/hr over 60 Minutes Intravenous  Once 12/28/18 2237 12/28/18 2242   12/28/18 1800  vancomycin (VANCOCIN) IVPB 1000 mg/200 mL premix     1,000 mg 200 mL/hr over 60 Minutes Intravenous  Once 12/28/18 1750 12/28/18 1953   12/28/18 1800  piperacillin-tazobactam (ZOSYN) IVPB 3.375 g     3.375 g 100 mL/hr over 30 Minutes Intravenous  Once 12/28/18 1750 12/28/18 1847          Medications  Scheduled Meds:  amLODipine  10 mg Oral QHS   calamine   Topical BID   doxazosin  1 mg Oral QHS   enoxaparin (LOVENOX) injection  40 mg Subcutaneous Daily   ferrous sulfate  325 mg Oral BID WC   gabapentin  200 mg Oral BID   irbesartan  300 mg Oral Daily   potassium chloride SA  20 mEq Oral BID   Continuous Infusions:  sodium chloride 75 mL/hr at 01/02/19 0600   anidulafungin 100 mg (01/01/19 2215)   ertapenem 1,000 mg (01/01/19 1929)   PRN Meds:.acetaminophen **OR** acetaminophen, albuterol, alum & mag hydroxide-simeth, bisacodyl, guaiFENesin-dextromethorphan, hydrALAZINE, hydrocortisone, hydrocortisone cream, ipratropium, lip balm, loratadine, magnesium citrate, Muscle Rub, ondansetron **OR** ondansetron (ZOFRAN) IV, oxyCODONE, oxyCODONE-acetaminophen **AND** oxyCODONE, phenol, polyethylene glycol, polyvinyl alcohol, prochlorperazine, senna-docusate, sodium chloride, sodium chloride flush, zolpidem      Subjective:   Cory Kelly  was seen and examined today.  + Generalized weakness, did not sleep well.  No visual changes or fevers.  Patient denies dizziness, chest pain, shortness of breath, abdominal pain, N/V/D/C, new weakness, numbess, tingling. No acute events overnight.    Objective:   Vitals:   01/01/19 1400 01/01/19 2040 01/02/19 0559 01/02/19 1354  BP: 138/82 137/86 111/72 115/78  Pulse: 63 61 63 73  Resp: 18 18 16 15   Temp: 98.2 F (36.8 C) 98.8 F (37.1 C) 98.2 F (36.8 C) 98.1 F (36.7 C)  TempSrc: Oral Oral Oral Oral  SpO2: 98% 97% 95% 98%  Weight:      Height:        Intake/Output Summary (Last 24 hours) at 01/02/2019 1457 Last  data filed at 01/02/2019 0700 Gross per 24 hour  Intake 542.89 ml  Output 200 ml  Net 342.89 ml     Wt Readings from Last 3 Encounters:  12/28/18 88.6 kg  12/24/18 88.6 kg  12/24/18 88.6 kg     Exam  General: Alert and oriented x 3, NAD  Eyes:   HEENT:  Atraumatic, normocephalic, normal oropharynx  Cardiovascular: S1 S2 auscultated, no rubs, murmurs or gallops. Regular rate and rhythm.  Respiratory: Clear to auscultation bilaterally, no wheezing, rales or rhonchi  Gastrointestinal: Soft, nontender, nondistended, + bowel sounds  Ext: no pedal edema bilaterally  Neuro: No new deficit  Musculoskeletal: No digital cyanosis, clubbing  Skin: No rashes  Psych: Normal affect and demeanor, alert and oriented x3    Data Reviewed:  I have personally reviewed following labs and imaging studies  Micro Results Recent Results (from the past 240 hour(s))  Culture, Blood     Status: Abnormal   Collection Time: 12/24/18 11:25 AM   Specimen: BLOOD RIGHT ARM  Result Value Ref Range Status   Specimen Description   Final    BLOOD RIGHT ARM Performed at [facility], [address] [place], [place] [address]    Special Requests   Final    BOTTLES DRAWN AEROBIC AND ANAEROBIC Blood Culture adequate volume   Culture  Setup Time   Final      AEROBIC BOTTLE ONLY GRAM POSITIVE COCCI CRITICAL RESULT CALLED TO, READ BACK BY AND VERIFIED WITH: J LEGGE PHARMD 12/25/18 2030 JDW GRAM VARIABLE ROD ANAEROBIC BOTTLE ONLY CRITICAL RESULT CALLED TO, READ BACK BY AND VERIFIED WITH: Damian Leavell [id number] Performed at [facility], [place] [address] [place], [place] [address]    Culture STREPTOCOCCUS ANGINOSIS (A)  Final   Report Status 12/28/2018 FINAL  Final   Organism ID, Bacteria STREPTOCOCCUS ANGINOSIS  Final      Susceptibility   Streptococcus anginosis - MIC*    PENICILLIN 0.12 SENSITIVE Sensitive     CEFTRIAXONE 0.5 SENSITIVE Sensitive     ERYTHROMYCIN <=0.12 SENSITIVE Sensitive     LEVOFLOXACIN <=0.25 SENSITIVE Sensitive     VANCOMYCIN 0.5 SENSITIVE Sensitive     * STREPTOCOCCUS ANGINOSIS  Blood Culture ID Panel (Reflexed)     Status: Abnormal   Collection Time: 12/24/18 11:25 AM  Result Value Ref Range Status   Enterococcus species NOT DETECTED NOT DETECTED Final   Listeria monocytogenes NOT DETECTED NOT DETECTED Final   Staphylococcus species NOT DETECTED NOT DETECTED Final   Staphylococcus aureus (BCID) NOT DETECTED NOT DETECTED Final   Streptococcus species DETECTED (A) NOT DETECTED Final    Comment: Not Enterococcus species, Streptococcus agalactiae, Streptococcus pyogenes, or Streptococcus pneumoniae. CRITICAL RESULT CALLED TO, READ BACK BY AND VERIFIED WITH: J LEGGE PHARMD 12/25/18 2030 JDW    Streptococcus agalactiae NOT DETECTED NOT DETECTED Final   Streptococcus pneumoniae NOT DETECTED NOT DETECTED Final   Streptococcus pyogenes NOT DETECTED NOT DETECTED Final   Acinetobacter baumannii NOT DETECTED NOT DETECTED Final   Enterobacteriaceae species NOT DETECTED NOT DETECTED Final   Enterobacter cloacae complex NOT DETECTED NOT DETECTED Final   Escherichia coli NOT DETECTED NOT DETECTED Final   Klebsiella oxytoca NOT DETECTED NOT DETECTED Final   Klebsiella pneumoniae NOT DETECTED NOT DETECTED Final    Proteus species NOT DETECTED NOT DETECTED Final   Serratia marcescens NOT DETECTED NOT DETECTED Final   Haemophilus influenzae NOT DETECTED NOT DETECTED Final   Neisseria  meningitidis NOT DETECTED NOT DETECTED Final   Pseudomonas aeruginosa NOT DETECTED NOT DETECTED Final   Candida albicans NOT DETECTED NOT DETECTED Final   Candida glabrata NOT DETECTED NOT DETECTED Final   Candida krusei NOT DETECTED NOT DETECTED Final   Candida parapsilosis NOT DETECTED NOT DETECTED Final   Candida tropicalis NOT DETECTED NOT DETECTED Final    Comment: Performed at [facility], [place] [address], [place], [place] [address]  Culture, Blood     Status: Abnormal   Collection Time: 12/24/18 12:09 PM   Specimen: Porta Cath; Blood  Result Value Ref Range Status   Specimen Description   Final    PORTA CATH Performed at [facility], [place] [address], [place], [place] [address]    Special Requests   Final    NONE Performed at [facility], [place] [address] [place], [place] [address]    Culture  Setup Time   Final    GRAM NEGATIVE RODS ANAEROBIC BOTTLE ONLY CRITICAL RESULT CALLED TO, READ BACK BY AND VERIFIED WITH: Sheffield Slider PharmD 12:00 12/27/18 ([email or web address])    Culture (A)  Final    BACTEROIDES THETAIOTAOMICRON BETA LACTAMASE POSITIVE Performed at [facility], [place] [address], [place], [place] [address]    Report Status 12/31/2018 FINAL  Final  Blood Culture ID Panel (Reflexed)     Status: None   Collection Time: 12/24/18 12:09 PM  Result Value Ref Range Status   Enterococcus species NOT DETECTED NOT DETECTED Final   Listeria monocytogenes NOT DETECTED NOT DETECTED Final   Staphylococcus species NOT DETECTED NOT DETECTED Final   Staphylococcus aureus (BCID) NOT DETECTED NOT DETECTED Final   Streptococcus species NOT DETECTED NOT DETECTED Final   Streptococcus agalactiae NOT DETECTED NOT DETECTED Final   Streptococcus pneumoniae NOT  DETECTED NOT DETECTED Final   Streptococcus pyogenes NOT DETECTED NOT DETECTED Final   Acinetobacter baumannii NOT DETECTED NOT DETECTED Final   Enterobacteriaceae species NOT DETECTED NOT DETECTED Final   Enterobacter cloacae complex NOT DETECTED NOT DETECTED Final   Escherichia coli NOT DETECTED NOT DETECTED Final   Klebsiella oxytoca NOT DETECTED NOT DETECTED Final   Klebsiella pneumoniae NOT DETECTED NOT DETECTED Final   Proteus species NOT DETECTED NOT DETECTED Final   Serratia marcescens NOT DETECTED NOT DETECTED Final   Haemophilus influenzae NOT DETECTED NOT DETECTED Final   Neisseria meningitidis NOT DETECTED NOT DETECTED Final   Pseudomonas aeruginosa NOT DETECTED NOT DETECTED Final   Candida albicans NOT DETECTED NOT DETECTED Final   Candida glabrata NOT DETECTED NOT DETECTED Final   Candida krusei NOT DETECTED NOT DETECTED Final   Candida parapsilosis NOT DETECTED NOT DETECTED Final   Candida tropicalis NOT DETECTED NOT DETECTED Final    Comment: Performed at [facility], [place] [address] [place], [place] [address]  Blood Culture ID Panel (Reflexed)     Status: None   Collection Time: 12/24/18 12:27 PM  Result Value Ref Range Status   Enterococcus species NOT DETECTED NOT DETECTED Final   Listeria monocytogenes NOT DETECTED NOT DETECTED Final   Staphylococcus species NOT DETECTED NOT DETECTED Final   Staphylococcus aureus (BCID) NOT DETECTED NOT DETECTED Final   Streptococcus species NOT DETECTED NOT DETECTED Final   Streptococcus agalactiae NOT DETECTED NOT DETECTED Final   Streptococcus pneumoniae NOT DETECTED NOT DETECTED Final   Streptococcus pyogenes NOT DETECTED NOT DETECTED Final   Acinetobacter baumannii NOT DETECTED NOT DETECTED Final   Enterobacteriaceae  species NOT DETECTED NOT DETECTED Final   Enterobacter cloacae complex NOT DETECTED NOT DETECTED Final   Escherichia coli NOT DETECTED NOT DETECTED Final   Klebsiella oxytoca NOT DETECTED NOT DETECTED  Final   Klebsiella pneumoniae NOT DETECTED NOT DETECTED Final   Proteus species NOT DETECTED NOT DETECTED Final   Serratia marcescens NOT DETECTED NOT DETECTED Final   Haemophilus influenzae NOT DETECTED NOT DETECTED Final   Neisseria meningitidis NOT DETECTED NOT DETECTED Final   Pseudomonas aeruginosa NOT DETECTED NOT DETECTED Final   Candida albicans NOT DETECTED NOT DETECTED Final   Candida glabrata NOT DETECTED NOT DETECTED Final   Candida krusei NOT DETECTED NOT DETECTED Final   Candida parapsilosis NOT DETECTED NOT DETECTED Final   Candida tropicalis NOT DETECTED NOT DETECTED Final    Comment: Performed at [facility], [place] [address], [place], [place] [address]  Urine culture     Status: None   Collection Time: 12/24/18  1:28 PM   Specimen: Urine, Clean Catch  Result Value Ref Range Status   Specimen Description   Final    URINE, CLEAN CATCH Performed at [facility], [place] [address], [place], [place] [address]    Special Requests   Final    NONE Performed at [facility], [place] [address], [place], [place] [address]    Culture   Final    NO GROWTH Performed at [facility], [place] [address], [place], [place] [address]    Report Status 12/25/2018 FINAL  Final  SARS Coronavirus 2 (CEPHEID - Performed in [place] hospital lab), Hosp Order     Status: None   Collection Time: 12/24/18  1:28 PM   Specimen: Nasopharyngeal Swab  Result Value Ref Range Status   SARS Coronavirus 2 NEGATIVE NEGATIVE Final    Comment: (NOTE) If result is NEGATIVE SARS-CoV-2 target nucleic acids are NOT DETECTED. The SARS-CoV-2 RNA is generally detectable in upper and lower  respiratory specimens during the acute phase of infection. The lowest  concentration of SARS-CoV-2 viral copies this assay can detect is 250  copies / mL. A negative result does not preclude SARS-CoV-2 infection  and should not be used as the sole basis for treatment or other    patient management decisions.  A negative result may occur with  improper specimen collection / handling, submission of specimen other  than nasopharyngeal swab, presence of viral mutation(s) within the  areas targeted by this assay, and inadequate number of viral copies  (<250 copies / mL). A negative result must be combined with clinical  observations, patient history, and epidemiological information. If result is POSITIVE SARS-CoV-2 target nucleic acids are DETECTED. The SARS-CoV-2 RNA is generally detectable in upper and lower  respiratory specimens dur ing the acute phase of infection.  Positive  results are indicative of active infection with SARS-CoV-2.  Clinical  correlation with patient history and other diagnostic information is  necessary to determine patient infection status.  Positive results do  not rule out bacterial infection or co-infection with other viruses. If result is PRESUMPTIVE POSTIVE SARS-CoV-2 nucleic acids MAY BE PRESENT.   A presumptive positive result was obtained on the submitted specimen  and confirmed on repeat testing.  While 2019 novel coronavirus  (SARS-CoV-2) nucleic acids may be present in the submitted sample  additional confirmatory testing may be necessary for epidemiological  and / or clinical management purposes  to differentiate between  SARS-CoV-2 and other Sarbecovirus currently known to  infect humans.  If clinically indicated additional testing with an alternate test  methodology [id number]) is advised. The SARS-CoV-2 RNA is generally  detectable in upper and lower respiratory sp ecimens during the acute  phase of infection. The expected result is Negative. Fact Sheet for Patients:  StrictlyIdeas.no Fact Sheet for Healthcare Providers: BankingDealers.co.za This test is not yet approved or cleared by the [place] FDA and has been authorized for detection and/or diagnosis of SARS-CoV-2  by FDA under an Emergency Use Authorization (EUA).  This EUA will remain in effect (meaning this test can be used) for the duration of the COVID-19 declaration under Section 564(b)(1) of the Act, 21 U.S.C. section 360bbb-3(b)(1), unless the authorization is terminated or revoked sooner. Performed at [facility], [place] [address] [place], [place] [address]   Culture, blood (routine x 2)     Status: None   Collection Time: 12/24/18  5:22 PM   Specimen: BLOOD  Result Value Ref Range Status   Specimen Description   Final    BLOOD LEFT ANTECUBITAL Performed at [facility], [place] [address] [place], [place] [address]    Special Requests   Final    BOTTLES DRAWN AEROBIC AND ANAEROBIC Blood Culture results may not be optimal due to an excessive volume of blood received in culture bottles Performed at [place] [address] [place], [place] [address]    Culture   Final    NO GROWTH 5 DAYS Performed at [facility], [place] [address] [place], [place] [address]    Report Status 12/29/2018 FINAL  Final  Culture, blood (routine x 2)     Status: None   Collection Time: 12/24/18  5:29 PM   Specimen: BLOOD  Result Value Ref Range Status   Specimen Description   Final    BLOOD RIGHT ANTECUBITAL Performed at [facility], [place] [address] [place], [place] [address]    Special Requests   Final    BOTTLES DRAWN AEROBIC AND ANAEROBIC Blood Culture adequate volume Performed at [place] [address], [place], [place] [address]    Culture   Final    NO GROWTH 5 DAYS Performed at [facility], [place] [address], [place], [place] [address]    Report Status 12/29/2018 FINAL  Final  Culture, blood (Routine x 2)     Status: Abnormal (Preliminary result)   Collection Time: 12/28/18  5:50 PM   Specimen: BLOOD LEFT FOREARM  Result Value Ref Range Status   Specimen Description   Final    BLOOD LEFT  FOREARM Performed at [facility], [place] [address], [place], [place] [address]    Special Requests   Final    BOTTLES DRAWN AEROBIC AND ANAEROBIC Blood Culture adequate volume Performed at [place] [address], [place], [place] [address]    Culture  Setup Time   Final    IN BOTH AEROBIC AND ANAEROBIC BOTTLES YEAST CRITICAL RESULT CALLED TO, READ BACK BY AND VERIFIED WITH: Sheffield Slider [facility] 12/31/18 2103 JDW    Culture (A)  Final    CANDIDA GLABRATA Sent to [place] for further susceptibility testing. Performed at [facility], [place] [address], [place], [place] [address]    Report Status PENDING  Incomplete  Culture, blood (Routine x 2)     Status: None   Collection Time: 12/28/18  5:50 PM   Specimen: BLOOD  Result Value  Ref Range Status   Specimen Description   Final    BLOOD RIGHT MIDLINE Performed at [place] [address], [place], [place] [address]    Special Requests   Final    BOTTLES DRAWN AEROBIC AND ANAEROBIC Blood Culture adequate volume Performed at [place] [address] [place], [place] [address]    Culture   Final    NO GROWTH 5 DAYS Performed at [facility], [place] [address], [place], [place] [address]    Report Status 01/02/2019 FINAL  Final  Blood Culture ID Panel (Reflexed)     Status: Abnormal   Collection Time: 12/28/18  5:50 PM  Result Value Ref Range Status   Enterococcus species NOT DETECTED NOT DETECTED Final   Listeria monocytogenes NOT DETECTED NOT DETECTED Final   Staphylococcus species NOT DETECTED NOT DETECTED Final   Staphylococcus aureus (BCID) NOT DETECTED NOT DETECTED Final   Streptococcus species NOT DETECTED NOT DETECTED Final   Streptococcus agalactiae NOT DETECTED NOT DETECTED Final   Streptococcus pneumoniae NOT DETECTED NOT DETECTED Final   Streptococcus pyogenes NOT DETECTED NOT DETECTED Final   Acinetobacter baumannii NOT DETECTED NOT  DETECTED Final   Enterobacteriaceae species NOT DETECTED NOT DETECTED Final   Enterobacter cloacae complex NOT DETECTED NOT DETECTED Final   Escherichia coli NOT DETECTED NOT DETECTED Final   Klebsiella oxytoca NOT DETECTED NOT DETECTED Final   Klebsiella pneumoniae NOT DETECTED NOT DETECTED Final   Proteus species NOT DETECTED NOT DETECTED Final   Serratia marcescens NOT DETECTED NOT DETECTED Final   Haemophilus influenzae NOT DETECTED NOT DETECTED Final   Neisseria meningitidis NOT DETECTED NOT DETECTED Final   Pseudomonas aeruginosa NOT DETECTED NOT DETECTED Final   Candida albicans NOT DETECTED NOT DETECTED Final   Candida glabrata DETECTED (A) NOT DETECTED Final    Comment: CRITICAL RESULT CALLED TO, READ BACK BY AND VERIFIED WITH: Sheffield Slider [facility] 12/31/18 2103 JDW    Candida krusei NOT DETECTED NOT DETECTED Final   Candida parapsilosis NOT DETECTED NOT DETECTED Final   Candida tropicalis NOT DETECTED NOT DETECTED Final    Comment: Performed at [facility], [address], [place],  [address]  SARS Coronavirus 2 (CEPHEID - Performed in [place] hospital lab), Hosp Order     Status: None   Collection Time: 12/28/18  5:52 PM   Specimen: Nasopharyngeal Swab  Result Value Ref Range Status   SARS Coronavirus 2 NEGATIVE NEGATIVE Final    Comment: (NOTE) If result is NEGATIVE SARS-CoV-2 target nucleic acids are NOT DETECTED. The SARS-CoV-2 RNA is generally detectable in upper and lower  respiratory specimens during the acute phase of infection. The lowest  concentration of SARS-CoV-2 viral copies this assay can detect is 250  copies / mL. A negative result does not preclude SARS-CoV-2 infection  and should not be used as the sole basis for treatment or other  patient management decisions.  A negative result may occur with  improper specimen collection / handling, submission of specimen other  than nasopharyngeal swab, presence of viral mutation(s) within the   areas targeted by this assay, and inadequate number of viral copies  (<250 copies / mL). A negative result must be combined with clinical  observations, patient history, and epidemiological information. If result is POSITIVE SARS-CoV-2 target nucleic acids are DETECTED. The SARS-CoV-2 RNA is generally detectable in upper and lower  respiratory specimens dur ing the acute phase of infection.  Positive  results are indicative of active infection with SARS-CoV-2.  Clinical  correlation with patient history and other diagnostic information is  necessary to determine patient infection status.  Positive results do  not rule out bacterial infection or co-infection with other viruses. If result is PRESUMPTIVE POSTIVE SARS-CoV-2 nucleic acids MAY BE PRESENT.   A presumptive positive result was obtained on the submitted specimen  and confirmed on repeat testing.  While 2019 novel coronavirus  (SARS-CoV-2) nucleic acids may be present in the submitted sample  additional confirmatory testing may be necessary for epidemiological  and / or clinical management purposes  to differentiate between  SARS-CoV-2 and other Sarbecovirus currently known to infect humans.  If clinically indicated additional testing with an alternate test  methodology [id number]) is advised. The SARS-CoV-2 RNA is generally  detectable in upper and lower respiratory sp ecimens during the acute  phase of infection. The expected result is Negative. Fact Sheet for Patients:  StrictlyIdeas.no Fact Sheet for Healthcare Providers: BankingDealers.co.za This test is not yet approved or cleared by the [place] FDA and has been authorized for detection and/or diagnosis of SARS-CoV-2 by FDA under an Emergency Use Authorization (EUA).  This EUA will remain in effect (meaning this test can be used) for the duration of the COVID-19 declaration under Section 564(b)(1) of the Act, 21  U.S.C. section 360bbb-3(b)(1), unless the authorization is terminated or revoked sooner. Performed at [facility], [place] [address] [place], [place] [address]   Culture, body fluid-bottle     Status: None (Preliminary result)   Collection Time: 12/31/18 12:51 PM   Specimen: Peritoneal Washings  Result Value Ref Range Status   Specimen Description PERITONEAL  Final   Special Requests NONE  Final   Culture   Final    NO GROWTH 2 DAYS Performed at [facility], [address], [place], [place] [address]    Report Status PENDING  Incomplete  Gram stain     Status: None   Collection Time: 12/31/18 12:51 PM   Specimen: Peritoneal Washings  Result Value Ref Range Status   Specimen Description PERITONEAL  Final   Special Requests NONE  Final   Gram Stain   Final    NO WBC SEEN NO ORGANISMS SEEN Performed at [facility], [address] [place], [place] [address]    Report Status 12/31/2018 FINAL  Final  Culture, blood (routine x 2)     Status: None (Preliminary result)   Collection Time: 12/31/18  9:54 PM   Specimen: BLOOD  Result Value Ref Range Status   Specimen Description   Final    BLOOD LEFT ANTECUBITAL Performed at [place] [address] [place], [place] [address]    Special Requests   Final    BOTTLES DRAWN AEROBIC AND ANAEROBIC Blood Culture adequate volume Performed at [place] [address], [place], [place] [address]    Culture   Final    NO GROWTH 2 DAYS Performed at [place] [address] [place], [place] [address]    Report Status PENDING  Incomplete  Culture, blood (routine x 2)     Status: None (Preliminary result)   Collection Time: 12/31/18  9:54 PM   Specimen: BLOOD  Result Value Ref Range Status   Specimen Description   Final    BLOOD BLOOD RIGHT HAND Performed at [place] [address], [place],  [address]    Special  Requests   Final    BOTTLES DRAWN AEROBIC AND ANAEROBIC Blood Culture adequate volume Performed at [facility] [place] [address] [place], [place] [address]    Culture   Final    NO GROWTH 2 DAYS Performed at [place] [address] [place], [place] [address]    Report Status PENDING  Incomplete  C difficile quick scan w PCR reflex     Status: None   Collection Time: 01/01/19 11:33 AM   Specimen: STOOL  Result Value Ref Range Status   C Diff antigen NEGATIVE NEGATIVE Final   C Diff toxin NEGATIVE NEGATIVE Final   C Diff interpretation No C. difficile detected.  Final    Comment: Performed at [facility], [place] [address], [place], [place] [address]    Radiology Reports Dg Chest 2 View  Result Date: 12/28/2018 CLINICAL DATA:  Sepsis EXAM: CHEST - 2 VIEW COMPARISON:  December 24, 2018 FINDINGS: The right-sided Port-A-Cath has been removed. There is a new right-sided PICC line with tip terminating over the distal SVC. The cardiac size is stable. There is no pneumothorax. No large pleural effusion. The lung volumes are slightly low. IMPRESSION: No active cardiopulmonary disease. Electronically Signed   By: Constance Holster M.D.   On: 12/28/2018 18:53   Mr Abdomen W Wo Contrast  Result Date: 12/04/2018 CLINICAL DATA:  Follow-up pancreas cancer. Assess for worsening of disease. EXAM: MRI ABDOMEN WITHOUT AND WITH CONTRAST TECHNIQUE: Multiplanar multisequence MR imaging of the abdomen was performed both before and after the administration of intravenous contrast. CONTRAST:  10 cc Gadavist COMPARISON:  11/06/2018 FINDINGS: Lower chest: Tiny bilateral pleural effusions are identified, similar to previous exam. Hepatobiliary: Multiple liver metastases are again noted. Index lesion within segment 5 measures 1.7 by 1.3 cm, image 44/1003. Previously 1.7 x 1.5 cm. The index lesion within segment 2 measures 1.0 by 0.8 cm, image 29/1003. Previously 1.2 x 0.8 cm.  New lesion within segment 4 a measures 1 by 0.9 cm, image 31/1002. The remainder of the liver metastasis are relatively stable in the interval. Gallbladder unremarkable. Common bile duct stent in place without intrahepatic bile duct dilatation. Pancreas: The soft tissue mass within the head of pancreas measures approximately 3.7 x 4.1 cm, image 59/1001. Previously 3.5 x 3.1 cm. Diffuse atrophy and pancreatic ductal dilatation is again noted within the body and tail. Encasement and obstruction of the portal venous confluence is again identified with distal reconstitution of the portal vein via collaterals. Spleen: Splenomegaly is again noted. The spleen has a cranial caudal dimension 20.1 cm. Similar. Adrenals/Urinary Tract: Normal appearance of the adrenal glands. No kidney mass or hydronephrosis. Simple appearing inferior pole right kidney cyst measures 2.7 cm. Stomach/Bowel: Visualized portions within the abdomen are unremarkable. Vascular/Lymphatic: No evidence for abdominal aortic aneurysm. Esophageal and splenic varices as well as portosystemic venous collaterals compatible with portal venous hypertension. No adenopathy identified. Other:  Interval increase in volume of abdominal ascites. Musculoskeletal: No suspicious bone lesions identified. IMPRESSION: 1. There is been mild increase in size of mass involving head of pancreas. 2. Diffuse hepatic metastasis. The previous index lesions are unchanged in size compared with previous exam. A single new metastasis is identified within the medial segment of left lobe of liver. 3. 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 Abdominal ascites has increased in the interval. Electronically Signed   By: Kerby Moors M.D.   On: 12/04/2018 11:27   Ct Abdomen Pelvis  W Contrast  Result Date: 12/24/2018 CLINICAL DATA:  Abdominal distension, fever, history of pancreatic cancer EXAM: CT ABDOMEN AND PELVIS WITH CONTRAST TECHNIQUE:  Multidetector CT imaging of the abdomen and pelvis was performed using the standard protocol following bolus administration of intravenous contrast. CONTRAST:  1100mL OMNIPAQUE IOHEXOL 300 MG/ML  SOLN COMPARISON:  MR abdomen pelvis, 12/04/2018, 10/30/2018 FINDINGS: Lower chest: No acute abnormality. Hepatobiliary: Known liver metastases are poorly appreciated by single-phase CT, for example in the dome of the liver (series 2, image 14). No gallstones or gallbladder wall thickening. Common bile duct stent is positioned tip in the descending portion of the duodenum with post stenting pneumobilia. Pancreas: Discrete pancreatic head mass is not well appreciated, primarily appreciated by mass effect on the confluence of the portal vein (series 2, image 38. There is atrophy of the pancreatic body and tail and dilatation pancreatic duct. Spleen: Splenomegaly. Adrenals/Urinary Tract: Adrenal glands are unremarkable. Nonobstructive bilateral nephrolithiasis. Bladder is unremarkable. Stomach/Bowel: Stomach is within normal limits. Appendix is not clearly visualized. No evidence of bowel wall thickening, distention, or inflammatory changes. Large burden of stool in the distal colon and rectum. Vascular/Lymphatic: Aortic atherosclerosis. No enlarged abdominal or pelvic lymph nodes. Reproductive: No mass or other significant abnormality. Other: No abdominal wall hernia or abnormality. Moderate volume ascites. Musculoskeletal: No acute or significant osseous findings. IMPRESSION: 1. Redemonstrated pancreatic malignancy with hepatic metastases, status post common bile duct stenting. Primary mass and hepatic metastases are better appreciated by recent multiphasic MRI. 2.  Moderate volume ascites, which is similar to prior MRI. 3.  Splenomegaly. 4.  Large burden of stool in the distal colon and rectum. 5.  Nonobstructive bilateral nephrolithiasis. Electronically Signed   By: Eddie Candle M.D.   On: 12/24/2018 14:35   Mr 3d Recon At  Scanner  Result Date: 12/04/2018 CLINICAL DATA:  Follow-up pancreas cancer. Assess for worsening of disease. EXAM: MRI ABDOMEN WITHOUT AND WITH CONTRAST TECHNIQUE: Multiplanar multisequence MR imaging of the abdomen was performed both before and after the administration of intravenous contrast. CONTRAST:  10 cc Gadavist COMPARISON:  11/06/2018 FINDINGS: Lower chest: Tiny bilateral pleural effusions are identified, similar to previous exam. Hepatobiliary: Multiple liver metastases are again noted. Index lesion within segment 5 measures 1.7 by 1.3 cm, image 44/1003. Previously 1.7 x 1.5 cm. The index lesion within segment 2 measures 1.0 by 0.8 cm, image 29/1003. Previously 1.2 x 0.8 cm. New lesion within segment 4 a measures 1 by 0.9 cm, image 31/1002. The remainder of the liver metastasis are relatively stable in the interval. Gallbladder unremarkable. Common bile duct stent in place without intrahepatic bile duct dilatation. Pancreas: The soft tissue mass within the head of pancreas measures approximately 3.7 x 4.1 cm, image 59/1001. Previously 3.5 x 3.1 cm. Diffuse atrophy and pancreatic ductal dilatation is again noted within the body and tail. Encasement and obstruction of the portal venous confluence is again identified with distal reconstitution of the portal vein via collaterals. Spleen: Splenomegaly is again noted. The spleen has a cranial caudal dimension 20.1 cm. Similar. Adrenals/Urinary Tract: Normal appearance of the adrenal glands. No kidney mass or hydronephrosis. Simple appearing inferior pole right kidney cyst measures 2.7 cm. Stomach/Bowel: Visualized portions within the abdomen are unremarkable. Vascular/Lymphatic: No evidence for abdominal aortic aneurysm. Esophageal and splenic varices as well as portosystemic venous collaterals compatible with portal venous hypertension. No adenopathy identified. Other:  Interval increase in volume of abdominal ascites. Musculoskeletal: No suspicious bone  lesions identified. IMPRESSION: 1. There is been  mild increase in size of mass involving head of pancreas. 2. Diffuse hepatic metastasis. The previous index lesions are unchanged in size compared with previous exam. A single new metastasis is identified within the medial segment of left lobe of liver. 3. 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 Abdominal ascites has increased in the interval. Electronically Signed   By: Kerby Moors M.D.   On: 12/04/2018 11:27   US Paracentesis  Result Date: 12/31/2018 INDICATION: Patient with history of metastatic pancreatic cancer, ascites. Request made for diagnostic and therapeutic paracentesis. EXAM: ULTRASOUND GUIDED DIAGNOSTIC AND THERAPEUTIC PARACENTESIS MEDICATIONS: None COMPLICATIONS: None immediate. PROCEDURE: Informed written consent was obtained from the patient after a discussion of the risks, benefits and alternatives to treatment. A timeout was performed prior to the initiation of the procedure. Initial ultrasound scanning demonstrates a small to moderate amount of ascites within the right upper to mid abdominal quadrant. The right upper to mid abdomen was prepped and draped in the usual sterile fashion. 1% lidocaine was used for local anesthesia. Following this, a 19 gauge, 7-cm, Yueh catheter was introduced. An ultrasound image was saved for documentation purposes. The paracentesis was performed. The catheter was removed and a dressing was applied. The patient tolerated the procedure well without immediate post procedural complication. FINDINGS: A total of approximately 2.6 liters of slightly hazy, light yellow fluid was removed. Samples were sent to the laboratory as requested by the clinical team. IMPRESSION: Successful ultrasound-guided diagnostic and therapeutic paracentesis yielding 2.6 liters of peritoneal fluid. Read by: Rowe Robert, PA-C Electronically Signed   By: Sandi Mariscal M.D.   On: 12/31/2018 12:04   Ir  Removal [employer] W/ [place] W/o Fl Mod Sed  Result Date: 12/27/2018 CLINICAL DATA:  Bacteremia. Indwelling right IJ port catheter placed 08/30/2017 by Dr. Pascal Lux, has been working well without evident complication , removal requested EXAM: EXAM TUNNELED PORT CATHETER REMOVAL TECHNIQUE: The procedure, risks (including but not limited to bleeding, infection, organ damage ), benefits, and alternatives were explained to the patient. Questions regarding the procedure were encouraged and answered. The patient understands and consents to the procedure. Intravenous Fentanyl 159mcg and Versed 4mg  were administered as conscious sedation during continuous monitoring of the patient's level of consciousness and physiological / cardiorespiratory status by the radiology RN, with a total moderate sedation time of 37 minutes. Overlying skin prepped with chlorhexidine, draped in usual sterile fashion, infiltrated locally with 1% lidocaine. A small incision was made over the scar from previous placement. The port catheter was dissected free from the underlying soft tissues and removed intact. There is no gross purulence. Hemostasis was achieved. The port pocket was closed with deep interrupted and subcuticular continuous 3-0 Monocryl sutures, then covered with Dermabond. The patient tolerated the procedure well. COMPLICATIONS: COMPLICATIONS None immediate IMPRESSION: 1.  Technically successful tunneled Port catheter removal. Electronically Signed   By: Lucrezia Europe M.D.   On: 12/27/2018 16:14   Dg Chest Port 1 View  Result Date: 12/28/2018 CLINICAL DATA:  Sepsis hypertension EXAM: PORTABLE CHEST 1 VIEW COMPARISON:  12/28/2018, 12/24/2018 FINDINGS: Right upper extremity catheter tip overlies the SVC. No acute consolidation or effusion. Normal cardiomediastinal silhouette. No pneumothorax. IMPRESSION: No active disease. Electronically Signed   By: Donavan Foil M.D.   On: 12/28/2018 23:03   Dg Chest Portable 1 View  Result Date:  12/24/2018 CLINICAL DATA:  Fever and weakness. EXAM: PORTABLE CHEST 1 VIEW COMPARISON:  11/04/2018 FINDINGS: The cardiac silhouette, mediastinal and hilar contours are  stable. Mild cardiac enlargement and mild tortuosity of the thoracic aorta. The right IJ power port is stable. The lungs are clear. No pleural effusions. The bony thorax is intact. IMPRESSION: No acute cardiopulmonary findings. Electronically Signed   By: Marijo Sanes M.D.   On: 12/24/2018 13:59   Dg Ercp  Result Date: 12/06/2018 CLINICAL DATA:  63 year old male with a history of pancreatic cancer and obstructed jaundice with prior stent placement and progressive liver enzyme elevation. EXAM: ERCP TECHNIQUE: Multiple spot images obtained with the fluoroscopic device and submitted for interpretation post-procedure. FLUOROSCOPY TIME:  Fluoroscopy Time:  3 minutes 43 seconds Radiation Exposure Index (if provided by the fluoroscopic device): 66.7 mGy COMPARISON:  MRCP 12/04/2018 FINDINGS: A total of 4 intraoperative saved images are submitted for review. The images demonstrate a metal stent in the common hepatic and common bile ducts with a flexible endoscope in the descending duodenum. Subsequent images demonstrate wire cannulation of the stent and balloon dilation. IMPRESSION: ERCP with cannulation of the previously placed metal stent and balloon dilatation. These images were submitted for radiologic interpretation only. Please see the procedural report for the amount of contrast and the fluoroscopy time utilized. Electronically Signed   By: Jacqulynn Cadet M.D.   On: 12/06/2018 11:51   [place] Ascites (abdomen Limited)  Result Date: 12/25/2018 CLINICAL DATA:  63 year old male with abdominal distension, ascites EXAM: LIMITED ABDOMEN ULTRASOUND FOR ASCITES TECHNIQUE: Limited ultrasound survey for ascites was performed in all four abdominal quadrants. COMPARISON:  Prior CT scan of the abdomen and pelvis 12/24/2018 FINDINGS: Sonographic interrogation  was performed of the 4 quadrants of the abdomen. There is trace perihepatic fluid in the right upper quadrant with multiple small loops of bowel. Overall, fluid volume is low. No safe window for paracentesis. IMPRESSION: Small volume ascites, insufficient for paracentesis. Electronically Signed   By: Jacqulynn Cadet M.D.   On: 12/25/2018 12:13   Ir Picc Placement Right >5 Yrs Inc Img Guide  Result Date: 12/27/2018 CLINICAL DATA:  Bacteremia, needs venous access for planned chemotherapy Monday EXAM: PICC PLACEMENT WITH ULTRASOUND AND FLUOROSCOPY FLUOROSCOPY TIME:  6 seconds, 1 mGy TECHNIQUE: After written informed consent was obtained, patient was placed in the supine position on angiographic table. Patency of the right basilic vein was confirmed with ultrasound with image documentation. An appropriate skin site was determined. Skin site was marked. Region was prepped using maximum barrier technique including cap and mask, sterile gown, sterile gloves, large sterile sheet, and Chlorhexidine as cutaneous antisepsis. The region was infiltrated locally with 1% lidocaine. Under real-time ultrasound guidance, the right basilic vein was accessed with a 21 gauge micropuncture needle; the needle tip within the vein was confirmed with ultrasound image documentation. Needle exchanged over a 018 guidewire for a peel-away sheath, through which a 5-French single-lumen power injectable PICC trimmed to 46cm was advanced, positioned with its tip near the cavoatrial junction. Spot chest radiograph confirms appropriate catheter position. Catheter was flushed per protocol and secured externally. The patient tolerated procedure well. COMPLICATIONS: COMPLICATIONS none IMPRESSION: 1. Technically successful five [place] single lumen power injectable PICC placement Electronically Signed   By: Lucrezia Europe M.D.   On: 12/27/2018 16:15    Lab Data:  CBC: Recent Labs  Lab 12/27/18 0652 12/28/18 1750  12/29/18 0250 12/30/18 0429  12/31/18 0327 01/01/19 0440 01/02/19 0521  WBC 2.2* 5.7  --  3.5* 2.3* 2.4* 2.3* 3.5*  NEUTROABS 1.7 5.3  --   --   --  1.9  --   --  HGB 9.2* 9.3*  --  7.8* 8.0* 8.5* 8.8* 9.8*  HCT 27.8* 27.7*   < > 23.8* 25.3* 26.8* 26.9* 30.4*  MCV 86.1 85.0  --  86.2 86.9 86.7 86.2 87.9  PLT 90* 97*  --  86* 100* 124* 128* 131*   < > = values in this interval not displayed.   Basic Metabolic Panel: Recent Labs  Lab 12/28/18 1750 12/28/18 2334 12/29/18 0250 12/30/18 0429 12/31/18 0327 01/01/19 0440 01/02/19 0521  NA 132*  --  133* 135 135 133*  --   K 3.1*  --  3.2* 3.3* 3.6 3.7  --   CL 99  --  104 106 106 102  --   CO2 24  --  23 22 23 24   --   GLUCOSE 120*  --  134* 124* 110* 114*  --   BUN 9  --  9 7* 5* <5*  --   CREATININE 0.88  --  0.78 0.61 0.63 0.61 0.55*  CALCIUM 7.8*  --  7.5* 7.5* 7.8* 7.8*  --   MG  --  1.5*  --  1.9 1.8 2.1 1.8  PHOS  --  2.8  --   --   --   --   --    GFR: Estimated Creatinine Clearance: 121.5 mL/min (A) (by C-G formula based on SCr of 0.55 mg/dL (L)). Liver Function Tests: Recent Labs  Lab 12/27/18 0652 12/28/18 1750 12/30/18 0429 12/31/18 0327 01/01/19 0440  AST 28 35 45* 37 40  ALT 56* 51* 48* 46* 43  ALKPHOS 531* 525* 477* 522* 522*  BILITOT 0.4 0.7 0.4 0.5 0.6  PROT 4.9* 5.0* 4.5* 4.8* 4.8*  ALBUMIN 2.5* 2.4* 2.1* 2.3* 2.3*   No results for input(s): LIPASE, AMYLASE in the last 168 hours. No results for input(s): AMMONIA in the last 168 hours. Coagulation Profile: Recent Labs  Lab 12/28/18 1750 12/28/18 2334  INR 1.2 1.2   Cardiac Enzymes: No results for input(s): CKTOTAL, CKMB, CKMBINDEX, TROPONINI in the last 168 hours. BNP (last 3 results) No results for input(s): PROBNP in the last 8760 hours. HbA1C: No results for input(s): HGBA1C in the last 72 hours. CBG: No results for input(s): GLUCAP in the last 168 hours. Lipid Profile: No results for input(s): CHOL, HDL, LDLCALC, TRIG, CHOLHDL, LDLDIRECT in the last 72  hours. Thyroid Function Tests: No results for input(s): TSH, T4TOTAL, FREET4, T3FREE, THYROIDAB in the last 72 hours. Anemia Panel: No results for input(s): VITAMINB12, FOLATE, FERRITIN, TIBC, IRON, RETICCTPCT in the last 72 hours. Urine analysis:    Component Value Date/Time   COLORURINE YELLOW 12/28/2018 1750   APPEARANCEUR CLEAR 12/28/2018 1750   LABSPEC 1.016 12/28/2018 1750   PHURINE 5.0 12/28/2018 1750   GLUCOSEU 50 (A) 12/28/2018 1750   HGBUR SMALL (A) 12/28/2018 1750   BILIRUBINUR NEGATIVE 12/28/2018 1750   KETONESUR NEGATIVE 12/28/2018 1750   PROTEINUR 30 (A) 12/28/2018 1750   UROBILINOGEN 0.2 02/10/2007 1109   NITRITE NEGATIVE 12/28/2018 1750   LEUKOCYTESUR NEGATIVE 12/28/2018 1750     Raygen Linquist M.D. [facility] 01/02/2019, 2:57 PM  Pager: [phone] Between 7am to 7pm - call Pager - [phone]  After 7pm go to [email or web address] - password [email or web address]  Call night coverage person covering after 7pm

## 2019-01-02 NOTE — Consult Note (Signed)
Ophthalmology Initial Consult Note  Cory, Kelly, 63 y.o. male Date of Service:  01/02/2019  Requesting physician: Mendel Corning, MD  Information Obtained from: chart, patient Chief Complaint:  Candidiasis, r/o ocular involvement  HPI/Discussion:  Cory Kelly is a 63 y.o. male with a complex medical history that includes metastatic pancreatic cancer s/p biliary stent and hospitalized since 12/28/2018 presents with candidal fungemia. Ophthalmology was consulted to evaluate for ocular involvement. He is currently on IV antifungal treatment. He has no ocular complaints today. He is s/p LASIK OU many years ago (OD near vision; OS distance vision), but he otherwise has no ocular history. He specifically denies blurry vision, flashes, floaters, or curtains.  Past Ocular Hx:  LASIK as above Ocular Meds:  None Family ocular history: Noncontributory  Past Medical History:  Diagnosis Date  . Abdominal pain    due to bloating  . Bloating   . Cancer ([place])    skin cancer  . Essential hypertension   . Fatigue   . History of kidney stones    passed  . History of weight loss   . HOH (hard of hearing)    no hearing aids  . Neuralgia   . Pancreatic cancer ([place])   . Stress    loss of father in Dec 2018.   Past Surgical History:  Procedure Laterality Date  . APPENDECTOMY    . BILIARY STENT PLACEMENT N/A 06/25/2018   Procedure: BILIARY STENT PLACEMENT;  Surgeon: Rush Landmark Telford Nab., MD;  Location: [facility] ENDOSCOPY;  Service: Gastroenterology;  Laterality: N/A;  . BILIARY STENT PLACEMENT  07/20/2018   Procedure: BILIARY STENT PLACEMENT;  Surgeon: Milus Banister, MD;  Location: [facility] ENDOSCOPY;  Service: Gastroenterology;;  . BILIARY STENT PLACEMENT N/A 08/16/2018   Procedure: BILIARY STENT PLACEMENT;  Surgeon: Milus Banister, MD;  Location: [facility] ENDOSCOPY;  Service: Endoscopy;  Laterality: N/A;  . ENDOSCOPIC RETROGRADE CHOLANGIOPANCREATOGRAPHY (ERCP) WITH PROPOFOL N/A 08/10/2017   Procedure:  ENDOSCOPIC RETROGRADE CHOLANGIOPANCREATOGRAPHY (ERCP) WITH PROPOFOL;  Surgeon: Milus Banister, MD;  Location: [facility] ENDOSCOPY;  Service: Endoscopy;  Laterality: N/A;  . ENDOSCOPIC RETROGRADE CHOLANGIOPANCREATOGRAPHY (ERCP) WITH PROPOFOL N/A 07/20/2018   Procedure: ENDOSCOPIC RETROGRADE CHOLANGIOPANCREATOGRAPHY (ERCP) WITH PROPOFOL;  Surgeon: Milus Banister, MD;  Location: [facility] ENDOSCOPY;  Service: Gastroenterology;  Laterality: N/A;  Balloon Sweeps  . ENDOSCOPIC RETROGRADE CHOLANGIOPANCREATOGRAPHY (ERCP) WITH PROPOFOL N/A 08/16/2018   Procedure: ENDOSCOPIC RETROGRADE CHOLANGIOPANCREATOGRAPHY (ERCP) WITH PROPOFOL;  Surgeon: Milus Banister, MD;  Location: [facility] ENDOSCOPY;  Service: Endoscopy;  Laterality: N/A;  . ENDOSCOPIC RETROGRADE CHOLANGIOPANCREATOGRAPHY (ERCP) WITH PROPOFOL N/A 12/06/2018   Procedure: ENDOSCOPIC RETROGRADE CHOLANGIOPANCREATOGRAPHY (ERCP) WITH PROPOFOL;  Surgeon: Milus Banister, MD;  Location: [facility] ENDOSCOPY;  Service: Endoscopy;  Laterality: N/A;  cbd stent sweep  . ERCP N/A 06/25/2018   Procedure: ENDOSCOPIC RETROGRADE CHOLANGIOPANCREATOGRAPHY (ERCP);  Surgeon: Irving Copas., MD;  Location: [facility] ENDOS[facility];  Service: Gastroenterology;  Laterality: N/A;  . EUS N/A 08/10/2017   Procedure: UPPER ENDOSCOPIC ULTRASOUND (EUS) RADIAL;  Surgeon: Milus Banister, MD;  Location: [facility] ENDOSCOPY;  Service: Endoscopy;  Laterality: N/A;  . EYE SURGERY     lasik/left eye  . IR FLUORO GUIDE PORT INSERTION RIGHT  08/30/2017  . IR REMOVAL TUN ACCESS W/ PORT W/O FL MOD SED  12/27/2018  . IR US GUIDE VASC ACCESS RIGHT  08/30/2017  . REMOVAL OF STONES  06/25/2018   Procedure: REMOVAL OF STONES;  Surgeon: Rush Landmark Telford Nab., MD;  Location: [facility] ENDOSCOPY;  Service: Gastroenterology;;  . SKIN CANCER  EXCISION    . STENT REMOVAL  07/20/2018   Procedure: STENT REMOVAL;  Surgeon: Milus Banister, MD;  Location: [facility] ENDOSCOPY;  Service: Gastroenterology;;  . Lavell Islam REMOVAL  08/16/2018   Procedure: STENT  REMOVAL;  Surgeon: Milus Banister, MD;  Location: [facility] ENDOSCOPY;  Service: Endoscopy;;  . TEE WITHOUT CARDIOVERSION N/A 11/08/2018   Procedure: TRANSESOPHAGEAL ECHOCARDIOGRAM (TEE);  Surgeon: Acie Fredrickson Wonda Cheng, MD;  Location: [facility] ENDOSCOPY;  Service: Cardiovascular;  Laterality: N/A;    Prior to Admission Meds: Medications Prior to Admission  Medication Sig Dispense Refill Last Dose  . amLODipine (NORVASC) 10 MG tablet Take 10 mg by mouth at bedtime.    12/27/2018 at Unknown time  . cefTRIAXone (ROCEPHIN) IVPB Inject 2 g into the vein daily for 10 days. Indication:  Strep bacteremia Last Day of Therapy:  01/06/2019 Labs - Once weekly:  CBC/D and BMP, Labs - Every other week:  ESR and CRP 10 Units 0 12/28/2018 at Unknown time  . doxazosin (CARDURA) 1 MG tablet Take 1 mg by mouth at bedtime.   12/27/2018 at Unknown time  . gabapentin (NEURONTIN) 100 MG capsule Take 2 capsules (200 mg total) by mouth 2 (two) times daily. 120 capsule 1 12/27/2018 at Unknown time  . loperamide (IMODIUM A-D) 2 MG tablet Take 1 tablet (2 mg total) by mouth as directed. (Patient taking differently: Take 2-4 mg by mouth 3 (three) times daily as needed for diarrhea or loose stools. ) 30 tablet 0 Past Month at Unknown time  . olmesartan (BENICAR) 40 MG tablet Take 1 tablet (40 mg total) by mouth daily. 90 tablet 0 12/26/2018  . oxyCODONE-acetaminophen (PERCOCET) 10-325 MG tablet Take 1 tablet by mouth every 4 (four) hours as needed for pain. 75 tablet 0 12/28/2018 at Unknown time  . potassium chloride SA (K-DUR) 20 MEQ tablet Take 1 tablet (20 mEq total) by mouth 2 (two) times daily. 60 tablet 1 12/27/2018 at Unknown time  . PRAMOXINE-ZINC ACETATE EX Apply 1 application topically daily as needed (itching).   unknown  . prochlorperazine (COMPAZINE) 10 MG tablet Take 10 mg by mouth every 6 (six) hours as needed for nausea or vomiting.   unknown  . ciprofloxacin (CIPRO) 500 MG tablet Take 1 tablet (500 mg total) by mouth 2 (two) times  daily. (Patient not taking: Reported on 12/28/2018) 14 tablet 0 Not Taking at Unknown time  . lidocaine-prilocaine (EMLA) cream Apply 1 application topically as needed. Apply to portacath site 1-2 hours prior to use (Patient taking differently: Apply 1 application topically daily as needed (1-2 hours prior to portacath access). ) 30 g 2   . ondansetron (ZOFRAN ODT) 4 MG disintegrating tablet '4mg'$  ODT q4 hours prn nausea/vomit (Patient not taking: Reported on 12/24/2018) 12 tablet 0 Not Taking at Unknown time  . ondansetron (ZOFRAN) 4 MG tablet Take 1 tablet (4 mg total) by mouth every 6 (six) hours as needed for nausea. (Patient not taking: Reported on 12/28/2018) 20 tablet 0 Not Taking at Unknown time  . tamsulosin (FLOMAX) 0.4 MG CAPS capsule Take 1 capsule (0.4 mg total) by mouth daily after supper. 30 capsule 3     Inpatient Meds: See chart  No Known Allergies Social History   Tobacco Use  . Smoking status: Never Smoker  . Smokeless tobacco: Never Used  Substance Use Topics  . Alcohol use: No   Family History  Problem Relation Age of Onset  . Hyperlipidemia Father   . Heart disease Father   .  Dementia Father   . Memory loss Mother   . Colon cancer Neg Hx   . Esophageal cancer Neg Hx   . Inflammatory bowel disease Neg Hx   . Liver disease Neg Hx   . Rectal cancer Neg Hx   . Stomach cancer Neg Hx   . Pancreatic cancer Neg Hx     ROS: Other than ROS in the HPI, all other systems were negative.  Exam: Temp: 98.1 F (36.7 C) Pulse Rate: 73 BP: 115/78 Resp: 15 SpO2: 98 %  Visual Acuity:  near   OD 20/25   OS 20/40      OD OS  Confr Vis Fields Full Full  EOM (Primary) Full Full  Lids/Lashes Normal Normal  Conjunctiva White, quiet White, quiet  Adnexa  Normal, no proptosis Normal, no proptosis  Pupils  2.5 --> 2, brisk, no rAPD 2.5 --> 2, brisk, no rAPD  Cornea  Clear Clear  Anterior Chamber Formed, grossly quiet Formed, grossly quiet  Lens:  Early nuclear changes  Early nuclear changes  IOP (tonopen) 15 13  Fundus - Dilated? YES   Optic Disc - C:D Ratio 0.4 0.4                     Appearance  Pink, no edema or hemorrhage Pink, no edema or hemorrhage  Post Seg:  Retina                    Vessels Normal caliber Normal caliber                  Vitreous  Clear Clear                  Macula Good foveal reflex Good foveal reflex                  Periphery Normal, no holes or tears, no infiltrates or areas of whitening Normal, no holes or tears, no infiltrates or areas of whitening       Neuro:  Oriented to person, place, and time:  Yes Psychiatric:  Mood and Affect Appropriate:  Yes  Labs/imaging: see shart  A/P:  63 y.o. 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 - Explained that symptoms of ocular involvement would likely include: flashes, floaters, and new blurry vision. - Advised him to notify his care team if this should develop.  2) S/p LASIK - OD near, OS distance  3) Early cataracts OU - Likely not visually significant. - Recommend outpatient exam at patient's convenience.  R Wyatt Portela, MD   R Wyatt Portela, MD 01/02/2019, 5:03 PM

## 2019-01-02 NOTE — Consult Note (Signed)
   [facility] CM Inpatient Consult   01/02/2019  Cory Kelly 28-Mar-1956 [id number]   Referral received from inpatient [facility] for [place] Management (CM) consult. Patient is eligible for [facility] CM services under ACO plan. [name] is referred for 30 day readmission and unplanned readmission risk score of 30%, extreme.  Per RNCM, [facility] CM services discussed with patient. Patient declined services at this time. Information provided to patient.  Thank you for the referral.  Of note, [facility] Care Management services does not replace or interfere with any services that are arranged by inpatient case management or social work.  Netta Cedars, MSN, [facility] Liaison Nurse Mobile Phone [phone]  Toll free office [phone]

## 2019-01-03 DIAGNOSIS — A408 Other streptococcal sepsis: Secondary | ICD-10-CM

## 2019-01-03 DIAGNOSIS — C787 Secondary malignant neoplasm of liver and intrahepatic bile duct: Secondary | ICD-10-CM

## 2019-01-03 DIAGNOSIS — C250 Malignant neoplasm of head of pancreas: Secondary | ICD-10-CM

## 2019-01-03 LAB — CBC
HCT: 31.9 % — ABNORMAL LOW (ref 39.0–52.0)
Hemoglobin: 10.1 g/dL — ABNORMAL LOW (ref 13.0–17.0)
MCH: 28.1 pg (ref 26.0–34.0)
MCHC: 31.7 g/dL (ref 30.0–36.0)
MCV: 88.6 fL (ref 80.0–100.0)
Platelets: 129 10*3/uL — ABNORMAL LOW (ref 150–400)
RBC: 3.6 MIL/uL — ABNORMAL LOW (ref 4.22–5.81)
RDW: 15.5 % (ref 11.5–15.5)
WBC: 4.2 10*3/uL (ref 4.0–10.5)
nRBC: 0 % (ref 0.0–0.2)

## 2019-01-03 LAB — CREATININE, SERUM
Creatinine, Ser: 0.59 mg/dL — ABNORMAL LOW (ref 0.61–1.24)
GFR calc Af Amer: >60 mL/min (ref >60)
GFR calc non Af Amer: >60 mL/min (ref >60)

## 2019-01-03 LAB — MAGNESIUM: Magnesium: 1.9 mg/dL (ref 1.7–2.4)

## 2019-01-03 MED ORDER — LOPERAMIDE HCL 2 MG PO CAPS
4.0000 mg | ORAL_CAPSULE | Freq: Once | ORAL | Status: AC
  Administered 2019-01-03: 4 mg via ORAL
  Filled 2019-01-03: qty 2

## 2019-01-03 MED ORDER — LOPERAMIDE HCL 2 MG PO CAPS: 2.0000 mg | ORAL_CAPSULE | Freq: Three times a day (TID) | ORAL | Status: DC | PRN

## 2019-01-03 NOTE — Progress Notes (Signed)
HEMATOLOGY-ONCOLOGY PROGRESS NOTE  SUBJECTIVE: Cory Kelly denies fever and chills.  He has episodes of short lasting abdominal pain.  Diarrhea has resolved. 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[place])   08/31/2017 - 11/22/2017 Chemotherapy   The patient had palonosetron (ALOXI) injection 0.25 mg, 0.25 mg, Intravenous,  Once, 6 of 6 cycles Administration: 0.25 mg (08/31/2017), 0.25 mg (09/14/2017), 0.25 mg (09/28/2017), 0.25 mg (10/12/2017), 0.25 mg (10/26/2017), 0.25 mg (11/09/2017) pegfilgrastim-cbqv (UDENYCA) injection 6 mg, 6 mg, Subcutaneous, Once, 6 of 6 cycles Administration: 6 mg (09/02/2017), 6 mg (09/16/2017), 6 mg (09/30/2017), 6 mg (10/14/2017), 6 mg (10/28/2017), 6 mg (11/11/2017) irinotecan (CAMPTOSAR) 360 mg in sodium chloride 0.9 % 500 mL chemo infusion, 150 mg/m2 = 360 mg (100 % of original dose 150 mg/m2), Intravenous,  Once, 6 of 6 cycles Dose modification: 150 mg/m2 (original dose 150 mg/m2, Cycle 1, Reason: Provider Judgment) Administration: 360 mg (08/31/2017), 360 mg (09/14/2017), 360 mg (09/28/2017), 340 mg (10/12/2017), 340 mg (10/26/2017), 340 mg (11/09/2017) leucovorin 952 mg in sodium chloride 0.9 % 250 mL infusion, 400 mg/m2 = 952 mg, Intravenous,  Once, 6 of 6 cycles Administration: 952 mg (08/31/2017), 952 mg (09/14/2017), 952 mg (09/28/2017), 888 mg (10/12/2017), 888 mg (10/26/2017), 888 mg (11/09/2017) oxaliplatin (ELOXATIN) 200 mg in dextrose 5 % 500 mL chemo infusion, 85 mg/m2 = 200 mg, Intravenous,  Once, 6 of 6 cycles Dose modification: 65 mg/m2 (original dose 85 mg/m2, Cycle 4, Reason: Provider Judgment) Administration: 200 mg (08/31/2017), 200 mg (09/14/2017), 200 mg (09/28/2017), 145 mg (10/12/2017), 150 mg (10/26/2017), 150 mg (11/09/2017) fosaprepitant (EMEND) 150 mg, dexamethasone (DECADRON) 12 mg in sodium chloride 0.9 % 145 mL IVPB, , Intravenous,  Once, 6 of 6 cycles Administration:  (08/31/2017),   (09/14/2017),  (09/28/2017),  (10/12/2017),  (10/26/2017),  (11/09/2017) fluorouracil (ADRUCIL) 5,700 mg in sodium chloride 0.9 % 136 mL chemo infusion, 2,400 mg/m2 = 5,700 mg, Intravenous, 1 Day/Dose, 6 of 6 cycles Administration: 5,700 mg (08/31/2017), 5,700 mg (09/14/2017), 5,700 mg (09/28/2017), 5,350 mg (10/12/2017), 5,350 mg (10/26/2017), 5,350 mg (11/09/2017)  for chemotherapy treatment.    12/06/2017 - 02/28/2018 Chemotherapy   The patient had gemcitabine (GEMZAR) 1,786 mg in sodium chloride 0.9 % 250 mL chemo infusion, 800 mg/m2 = 1,786 mg (80 % of original dose 1,000 mg/m2), Intravenous,  Once, 4 of 4 cycles Dose modification: 800 mg/m2 (original dose 1,000 mg/m2, Cycle 1, Reason: Provider Judgment), 800 mg/m2 (original dose 1,000 mg/m2, Cycle 1, Reason: Provider Judgment, Comment: call from MD to decrease dose due to counts) Administration: 1,786 mg (12/20/2017), 1,786 mg (12/06/2017), 1,786 mg (01/03/2018), 1,786 mg (01/17/2018), 1,786 mg (01/31/2018), 1,786 mg (02/14/2018), 1,786 mg (02/28/2018) PACLitaxel-protein bound (ABRAXANE) chemo infusion 225 mg, 100 mg/m2 = 225 mg (80 % of original dose 125 mg/m2), Intravenous, Once, 3 of 3 cycles Dose modification: 100 mg/m2 (original dose 125 mg/m2, Cycle 1, Reason: Provider Judgment), 100 mg/m2 (original dose 125 mg/m2, Cycle 1, Reason: Provider Judgment, Comment: call from MD to decrease dose based on counts) Administration: 225 mg (12/20/2017), 225 mg (12/06/2017), 225 mg (01/03/2018), 225 mg (01/17/2018)  for chemotherapy treatment.    07/04/2018 Cancer Staging   Staging form: Exocrine Pancreas, AJCC 8th Edition - Pathologic: Stage IV (cM1) - Signed by Ladell Pier, MD on 07/04/2018   08/21/2018 -  Chemotherapy   The patient had palonosetron (ALOXI) injection 0.25 mg, 0.25 mg, Intravenous,  Once, 7 of 8  cycles Administration: 0.25 mg (08/21/2018), 0.25 mg (09/03/2018), 0.25 mg (09/18/2018), 0.25 mg (10/01/2018), 0.25 mg (10/16/2018), 0.25 mg (11/14/2018), 0.25 mg  (12/10/2018) pegfilgrastim-cbqv (UDENYCA) injection 6 mg, 6 mg, Subcutaneous, Once, 7 of 8 cycles Administration: 6 mg (08/23/2018), 6 mg (09/05/2018), 6 mg (09/20/2018), 6 mg (10/03/2018), 6 mg (10/18/2018), 6 mg (11/16/2018) irinotecan (CAMPTOSAR) 360 mg in dextrose 5 % 500 mL chemo infusion, 150 mg/m2 = 360 mg (100 % of original dose 150 mg/m2), Intravenous,  Once, 7 of 8 cycles Dose modification: 150 mg/m2 (original dose 150 mg/m2, Cycle 1, Reason: Provider Judgment), 112.5 mg/m2 (original dose 150 mg/m2, Cycle 1, Reason: Change in LFTs), 90 mg/m2 (original dose 150 mg/m2, Cycle 6, Reason: Provider Judgment), 90 mg/m2 (original dose 90 mg/m2, Cycle 7) Administration: 260 mg (08/21/2018), 260 mg (09/03/2018), 260 mg (09/18/2018), 260 mg (10/01/2018), 200 mg (11/14/2018), 200 mg (10/16/2018), 200 mg (12/10/2018) leucovorin 952 mg in dextrose 5 % 250 mL infusion, 400 mg/m2 = 952 mg, Intravenous,  Once, 7 of 8 cycles Dose modification: 300 mg/m2 (original dose 400 mg/m2, Cycle 1, Reason: Provider Judgment), 300 mg/m2 (original dose 300 mg/m2, Cycle 7) Administration: 696 mg (08/21/2018), 696 mg (09/03/2018), 696 mg (09/18/2018), 676 mg (10/01/2018), 676 mg (11/14/2018), 676 mg (10/16/2018), 676 mg (12/10/2018) oxaliplatin (ELOXATIN) 155 mg in dextrose 5 % 500 mL chemo infusion, 65 mg/m2 = 155 mg (100 % of original dose 65 mg/m2), Intravenous,  Once, 7 of 8 cycles Dose modification: 65 mg/m2 (original dose 65 mg/m2, Cycle 1, Reason: Provider Judgment) Administration: 150 mg (08/21/2018), 150 mg (09/03/2018), 150 mg (09/18/2018), 150 mg (10/01/2018), 150 mg (10/16/2018), 150 mg (11/14/2018), 150 mg (12/10/2018) fosaprepitant (EMEND) 150 mg, dexamethasone (DECADRON) 12 mg in sodium chloride 0.9 % 145 mL IVPB, , Intravenous,  Once, 7 of 8 cycles Administration:  (08/21/2018),  (09/03/2018),  (09/18/2018),  (10/01/2018),  (10/16/2018),  (11/14/2018),  (12/10/2018) fluorouracil (ADRUCIL) 5,700 mg in sodium chloride 0.9 % 136 mL chemo infusion, 2,400  mg/m2 = 5,700 mg, Intravenous, 1 Day/Dose, 7 of 8 cycles Dose modification: 1,800 mg/m2 (original dose 2,400 mg/m2, Cycle 1, Reason: Change in LFTs) Administration: 4,200 mg (08/21/2018), 4,200 mg (09/03/2018), 4,200 mg (09/18/2018), 4,050 mg (10/01/2018), 4,050 mg (10/16/2018), 4,050 mg (11/14/2018), 4,050 mg (12/10/2018)  for chemotherapy treatment.        PHYSICAL EXAMINATION:  Vitals:   01/02/19 2106 01/03/19 0630  BP: (!) 151/87 135/85  Pulse: (!) 55 (!) 57  Resp: 18 18  Temp: 98.2 F (36.8 C) 97.9 F (36.6 C)  SpO2: 96% 97%   Filed Weights   12/28/18 1703  Weight: 195 lb 5.2 oz (88.6 kg)    Intake/Output from previous day: 07/01 0701 - 07/02 0700 In: 906.1 [I.V.:820.9; IV Piggyback:85.2] Out: -   ABDOMEN: Nontender, distended with ascites, no mass Vascular: Trace pitting edema of the lower leg bilaterally NEURO: alert & oriented x 3 with fluent speech  LABORATORY DATA:  I have reviewed the data as listed CMP Latest Ref Rng & Units 01/03/2019 01/02/2019 01/01/2019  Glucose 70 - 99 mg/dL - - 114(H)  BUN 8 - 23 mg/dL - - <5(L)  Creatinine 0.61 - 1.24 mg/dL 0.59(L) 0.55(L) 0.61  Sodium 135 - 145 mmol/L - - 133(L)  Potassium 3.5 - 5.1 mmol/L - - 3.7  Chloride 98 - 111 mmol/L - - 102  CO2 22 - 32 mmol/L - - 24  Calcium 8.9 - 10.3 mg/dL - - 7.8(L)  Total Protein 6.5 - 8.1 g/dL - - 4.8(L)  Total Bilirubin 0.3 - 1.2 mg/dL - - 0.6  Alkaline Phos 38 - 126 U/L - - 522(H)  AST 15 - 41 U/L - - 40  ALT 0 - 44 U/L - - 43    Lab Results  Component Value Date   WBC 4.2 01/03/2019   HGB 10.1 (L) 01/03/2019   HCT 31.9 (L) 01/03/2019   MCV 88.6 01/03/2019   PLT 129 (L) 01/03/2019   NEUTROABS 1.9 12/31/2018    Dg Chest 2 View  Result Date: 12/28/2018 CLINICAL DATA:  Sepsis EXAM: CHEST - 2 VIEW COMPARISON:  December 24, 2018 FINDINGS: The right-sided Port-A-Cath has been removed. There is a new right-sided PICC line with tip terminating over the distal SVC. The cardiac size is stable.  There is no pneumothorax. No large pleural effusion. The lung volumes are slightly low. IMPRESSION: No active cardiopulmonary disease. Electronically Signed   By: Constance Holster M.D.   On: 12/28/2018 18:53   Ct Abdomen Pelvis W Contrast  Result Date: 12/24/2018 CLINICAL DATA:  Abdominal distension, fever, history of pancreatic cancer EXAM: CT ABDOMEN AND PELVIS WITH CONTRAST TECHNIQUE: Multidetector CT imaging of the abdomen and pelvis was performed using the standard protocol following bolus administration of intravenous contrast. CONTRAST:  156m OMNIPAQUE IOHEXOL 300 MG/ML  SOLN COMPARISON:  MR abdomen pelvis, 12/04/2018, 10/30/2018 FINDINGS: Lower chest: No acute abnormality. Hepatobiliary: Known liver metastases are poorly appreciated by single-phase CT, for example in the dome of the liver (series 2, image 14). No gallstones or gallbladder wall thickening. Common bile duct stent is positioned tip in the descending portion of the duodenum with post stenting pneumobilia. Pancreas: Discrete pancreatic head mass is not well appreciated, primarily appreciated by mass effect on the confluence of the portal vein (series 2, image 38. There is atrophy of the pancreatic body and tail and dilatation pancreatic duct. Spleen: Splenomegaly. Adrenals/Urinary Tract: Adrenal glands are unremarkable. Nonobstructive bilateral nephrolithiasis. Bladder is unremarkable. Stomach/Bowel: Stomach is within normal limits. Appendix is not clearly visualized. No evidence of bowel wall thickening, distention, or inflammatory changes. Large burden of stool in the distal colon and rectum. Vascular/Lymphatic: Aortic atherosclerosis. No enlarged abdominal or pelvic lymph nodes. Reproductive: No mass or other significant abnormality. Other: No abdominal wall hernia or abnormality. Moderate volume ascites. Musculoskeletal: No acute or significant osseous findings. IMPRESSION: 1. Redemonstrated pancreatic malignancy with hepatic  metastases, status post common bile duct stenting. Primary mass and hepatic metastases are better appreciated by recent multiphasic MRI. 2.  Moderate volume ascites, which is similar to prior MRI. 3.  Splenomegaly. 4.  Large burden of stool in the distal colon and rectum. 5.  Nonobstructive bilateral nephrolithiasis. Electronically Signed   By: AEddie CandleM.D.   On: 12/24/2018 14:35   [place]  Result Date: 12/31/2018 INDICATION: Patient with history of metastatic pancreatic cancer, ascites. Request made for diagnostic and therapeutic paracentesis. EXAM: ULTRASOUND GUIDED DIAGNOSTIC AND THERAPEUTIC PARACENTESIS MEDICATIONS: None COMPLICATIONS: None immediate. PROCEDURE: Informed written consent was obtained from the patient after a discussion of the risks, benefits and alternatives to treatment. A timeout was performed prior to the initiation of the procedure. Initial ultrasound scanning demonstrates a small to moderate amount of ascites within the right upper to mid abdominal quadrant. The right upper to mid abdomen was prepped and draped in the usual sterile fashion. 1% lidocaine was used for local anesthesia. Following this, a 19 gauge, 7-cm, Yueh catheter was introduced. An ultrasound image was saved for documentation purposes. The paracentesis was performed.  The catheter was removed and a dressing was applied. The patient tolerated the procedure well without immediate post procedural complication. FINDINGS: A total of approximately 2.6 liters of slightly hazy, light yellow fluid was removed. Samples were sent to the laboratory as requested by the clinical team. IMPRESSION: Successful ultrasound-guided diagnostic and therapeutic paracentesis yielding 2.6 liters of peritoneal fluid. Read by: Rowe Robert, PA-C Electronically Signed   By: Sandi Mariscal M.D.   On: 12/31/2018 12:04   Ir Removal [employer] W/ [place] W/o Fl Mod Sed  Result Date: 12/27/2018 CLINICAL DATA:  Bacteremia. Indwelling right IJ  port catheter placed 08/30/2017 by Dr. Pascal Lux, has been working well without evident complication , removal requested EXAM: EXAM TUNNELED PORT CATHETER REMOVAL TECHNIQUE: The procedure, risks (including but not limited to bleeding, infection, organ damage ), benefits, and alternatives were explained to the patient. Questions regarding the procedure were encouraged and answered. The patient understands and consents to the procedure. Intravenous Fentanyl 174mg and Versed 452mwere administered as conscious sedation during continuous monitoring of the patient's level of consciousness and physiological / cardiorespiratory status by the radiology RN, with a total moderate sedation time of 37 minutes. Overlying skin prepped with chlorhexidine, draped in usual sterile fashion, infiltrated locally with 1% lidocaine. A small incision was made over the scar from previous placement. The port catheter was dissected free from the underlying soft tissues and removed intact. There is no gross purulence. Hemostasis was achieved. The port pocket was closed with deep interrupted and subcuticular continuous 3-0 Monocryl sutures, then covered with Dermabond. The patient tolerated the procedure well. COMPLICATIONS: COMPLICATIONS None immediate IMPRESSION: 1.  Technically successful tunneled Port catheter removal. Electronically Signed   By: D Lucrezia Europe.D.   On: 12/27/2018 16:14   Dg Chest Port 1 View  Result Date: 12/28/2018 CLINICAL DATA:  Sepsis hypertension EXAM: PORTABLE CHEST 1 VIEW COMPARISON:  12/28/2018, 12/24/2018 FINDINGS: Right upper extremity catheter tip overlies the SVC. No acute consolidation or effusion. Normal cardiomediastinal silhouette. No pneumothorax. IMPRESSION: No active disease. Electronically Signed   By: KiDonavan Foil.D.   On: 12/28/2018 23:03   Dg Chest Portable 1 View  Result Date: 12/24/2018 CLINICAL DATA:  Fever and weakness. EXAM: PORTABLE CHEST 1 VIEW COMPARISON:  11/04/2018 FINDINGS: The  cardiac silhouette, mediastinal and hilar contours are stable. Mild cardiac enlargement and mild tortuosity of the thoracic aorta. The right IJ power port is stable. The lungs are clear. No pleural effusions. The bony thorax is intact. IMPRESSION: No acute cardiopulmonary findings. Electronically Signed   By: P.Marijo Sanes.D.   On: 12/24/2018 13:59   Dg Ercp  Result Date: 12/06/2018 CLINICAL DATA:  [age]ear old male with a history of pancreatic cancer and obstructed jaundice with prior stent placement and progressive liver enzyme elevation. EXAM: ERCP TECHNIQUE: Multiple spot images obtained with the fluoroscopic device and submitted for interpretation post-procedure. FLUOROSCOPY TIME:  Fluoroscopy Time:  3 minutes 43 seconds Radiation Exposure Index (if provided by the fluoroscopic device): 66.7 mGy COMPARISON:  MRCP 12/04/2018 FINDINGS: A total of 4 intraoperative saved images are submitted for review. The images demonstrate a metal stent in the common hepatic and common bile ducts with a flexible endoscope in the descending duodenum. Subsequent images demonstrate wire cannulation of the stent and balloon dilation. IMPRESSION: ERCP with cannulation of the previously placed metal stent and balloon dilatation. These images were submitted for radiologic interpretation only. Please see the procedural report for the amount of contrast and the fluoroscopy time utilized.  Electronically Signed   By: Jacqulynn Cadet M.D.   On: 12/06/2018 11:51   [place] Ascites (abdomen Limited)  Result Date: 12/25/2018 CLINICAL DATA:  63 year old male with abdominal distension, ascites EXAM: LIMITED ABDOMEN ULTRASOUND FOR ASCITES TECHNIQUE: Limited ultrasound survey for ascites was performed in all four abdominal quadrants. COMPARISON:  Prior CT scan of the abdomen and pelvis 12/24/2018 FINDINGS: Sonographic interrogation was performed of the 4 quadrants of the abdomen. There is trace perihepatic fluid in the right upper quadrant  with multiple small loops of bowel. Overall, fluid volume is low. No safe window for paracentesis. IMPRESSION: Small volume ascites, insufficient for paracentesis. Electronically Signed   By: Jacqulynn Cadet M.D.   On: 12/25/2018 12:13   Ir Picc Placement Right >5 Yrs Inc Img Guide  Result Date: 12/27/2018 CLINICAL DATA:  Bacteremia, needs venous access for planned chemotherapy Monday EXAM: PICC PLACEMENT WITH ULTRASOUND AND FLUOROSCOPY FLUOROSCOPY TIME:  6 seconds, 1 mGy TECHNIQUE: After written informed consent was obtained, patient was placed in the supine position on angiographic table. Patency of the right basilic vein was confirmed with ultrasound with image documentation. An appropriate skin site was determined. Skin site was marked. Region was prepped using maximum barrier technique including cap and mask, sterile gown, sterile gloves, large sterile sheet, and Chlorhexidine as cutaneous antisepsis. The region was infiltrated locally with 1% lidocaine. Under real-time ultrasound guidance, the right basilic vein was accessed with a 21 gauge micropuncture needle; the needle tip within the vein was confirmed with ultrasound image documentation. Needle exchanged over a 018 guidewire for a peel-away sheath, through which a 5-French single-lumen power injectable PICC trimmed to 46cm was advanced, positioned with its tip near the cavoatrial junction. Spot chest radiograph confirms appropriate catheter position. Catheter was flushed per protocol and secured externally. The patient tolerated procedure well. COMPLICATIONS: COMPLICATIONS none IMPRESSION: 1. Technically successful five [place] single lumen power injectable PICC placement Electronically Signed   By: Lucrezia Europe M.D.   On: 12/27/2018 16:15    ASSESSMENT AND PLAN: 1. Pancreas head mass  CT abdomen/pelvis 08/03/2017-fullness in the head of the pancreas with stranding in the peripancreatic fat and pancreatic ductal dilatation.   MRI abdomen  08/04/2017-findings favored to represent acute on chronic pancreatitis; equivocal soft tissue fullness within the head and uncinate process of the pancreas; indeterminate too small to characterize right hepatic lobe lesion; small volume abdominal ascites; splenomegaly.   CA-19-9 elevated at 977on 08/04/2017.  Status postupper EUS 08/10/2017-findings of an irregular masslike region in the pancreatic head measuring approximately 2.7 cm. This directly abutted the main portal vein but no other major vascular structures. Biopsies obtained with preliminary cytology positive for malignancy, likely adenocarcinoma. The final report is pending. The common bile duct and main pancreatic duct were both dilated. ERCP was then proceeded with forstent placement. Multiple attempts were made tocannulate the bile duct without success. The procedure was aborted.  08/16/2017 ERCP with placement of a metal biliary stent in the common bile duct by Dr. Francella Solian at [facility]  Cycle 1 FOLFIRINOX 08/31/2017  Cycle 2 FOLFIRINOX 09/14/2017  Cycle 3 FOLFIRINOX 09/28/2017  Cycle 4 FOLFIRINOX 10/12/2017 (oxaliplatin dose reduced secondary to thrombocytopenia)  Cycle 5 FOLFIRINOX 10/26/2017  Cycle 6 FOLFIRINOX 11/09/2017  [facility] CT[facility] at [facility] 11/29/2017-no definitive evidence of distant metastatic disease. 2 subcentimeter liver lesions described on prior MRI not visualized on CT. Ill-defined pancreatic head mass stable to decreased in size measuring 2.1 x 2 cm. Peripancreatic inflammatory stranding decreased from prior. No biliary ductal  dilatation. Celiac axis less than 180 degrees abutment. Common hepatic artery with greater than 180 degrees encasement; superior mesenteric artery with short segment less than 180 degrees abutment; portal vein/superior mesenteric vein with greater than 180 degrees encasement of the extrahepatic portal vein with associated circumferential narrowing at the portomesenteric, overall substantially  improved from prior examination. Now less than 180 degree abutment of the SMV. The portal vein and SMV remain patent. Splenic vein patent.  Cycle 1 gemcitabine/Abraxane 12/06/2017  Cycle 2 gemcitabine/Abraxane 12/20/2017  Cycle 3 gemcitabine/Abraxane 01/03/2018  Cycle 4 gemcitabine/Abraxane 01/17/2018  Cycle 5 gemcitabine 01/31/2018 (Abraxane held due to neuropathy)  Cycle 6 gemcitabine 02/14/2018 (Abraxane held due to neuropathy)  Cycle 7 gemcitabine 02/28/2018 (Abraxane held due to neuropathy)  CT chest/abdomen/pelvis at [facility] 03/08/2018-stable appearance of previously identified infiltrating pancreatic head mass. No CT evidence of metastatic disease.  SBRT to the pancreas 04/05/2018-04/11/2018  05/09/2018 CA-19-9 2138   MRI abdomen 05/09/2018-similar 6 mm hypoenhancing focus posterior right lobe liver; more superior lesion near the dome of the liver not identified on the postcontrast imaging but there is a persistent focus of diffusion signal abnormality in this region; new 8 mm hypoenhancing focus located just superior to the gallbladder; additional tiny focus of diffusion abnormality located more superiorly without obvious associated abnormal enhancement. Ill-defined pancreatic head mass not felt to be significantly changed.  CT chest/abdomen/pelvis 05/10/2018-ill-defined hypoattenuating pancreatic head mass and ill-defined soft tissue abutting the celiac axis with mildly increased dilatation of the main pancreatic duct. New soft tissue nodule along the right upper lobe bronchus; hypoattenuating liver lesions concerning for metastasis.  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 The stent traverses the pancreatic mass and extends into the duodenum. Increase in size of 2 liver lesions. Pancreatic mass appears similar. Indistinct tissue planes around the duodenum along with wall thickening in the gallbladder and especially the gallbladder neck.  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 Diffuse hepatic metastasis. Previous index lesions unchanged in size. Single new metastasis identified within the medial segment left lobe of liver. Stigmata  of portal venous hypertension including splenomegaly and  varices. Abdominal ascites increased in the interval.  Cycle 7 FOLFIRINOX6/02/2019  Peritoneal fluid cytology on 12/31/2018-negative for malignancy 2. Hypertension 3. Port-A-Cath placement 08/30/2017 4. History of elevated bilirubin-questionGilbert's syndrome 5. Mild leukopenia, thrombocytopenia-question secondary to portal hypertension/splenomegaly;09/14/2017 white count improved, platelet count stable;progressive thrombocytopenia following gemcitabine/Abraxane 6. Kidney stones 7. Fever following cycles 2 and 3 gemcitabine/Abraxane,? Fever related to gemcitabine 8.Jaundice, elevated liver enzymes and bilirubin 06/22/2018; status post ERCP 06/25/2018 with debris removed from the existing stent and a new plastic stent placed. Recurrent jaundice 07/19/2018 status post ERCP 07/20/2018 with finding that the previously placed plastic stent had migrated distally. There was debris and sludge on the stent. The previously placed stent was removed. 5 to 8 mm stricture just proximal to the existing SEMS. 2 plastic stents were placed.ERCP 08/16/2018-2 plastic biliary stents were removed from within the previously placed partially covered SEMS; stricture confirmed just proximal to the previously placed SEMS, treated with placement of an 8 cm long 10 mm diameter uncovered SEMS in good position. 9.Hospitalized with bacteremia Streptococcus group C 11/04/2018 through 11/08/2018. IV ceftriaxone through 11/17/2018. TEE 11/08/2018 negative. 10.Elevated LFTs 12/03/2018-MRI abdomen 12/04/2018 with mild increase in size of mass involving head of the pancreas. Diffuse hepatic metastasis. Previous index lesions unchanged in size. Single new metastasis identified within the medial segment left lobe of liver. 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 Abdominal ascites increased in the interval. 12/06/2018 ERCP-previously placed uncovered 8 cm long metal biliary stent nearly filled with  bio debris and flecks of tissue ingrowth. Stent cleared. 11. 12/24/18 -admission with sepsis syndrome, blood culture from right arm positive for Streptococcus Anginosis, blood culture from Port-A-Cath with a Bacteroides species 12.  12/28/2018-readmission with sepsis syndrome, peripheral blood culture positive for Candida glabrata  ASSESSMENT/PLAN: Cory Kelly appears stable.  He is maintained on antibiotics for bacteremia and fungemia.  There is no clinical evidence for progression of the pancreas cancer.  The cytology from the paracentesis on 12/31/2018 is negative for malignancy.  The ascites is most likely related to portal hypertension.  He has an incurable malignancy, but there has been improvement in the elevated CA 19-9 while treated with FOLFIRINOX.  He is at increased risk for infection while on chemotherapy, but he has not developed prolonged neutropenia.  I will plan to resume chemotherapy if he remains afebrile over the next week.   1.  Continue IV antibiotics and antifungal per ID.   2.  Check CA 19-9 3.  Place PICC if blood cultures remain negative 3.  Outpatient follow-up at the Cancer center will be scheduled for the week of 01/14/2019 with the plan to resume FOLFIRINOX then.   LOS: 6 days   Betsy Coder, DNP, AGPCNP-BC, AOCNP 01/03/19

## 2019-01-03 NOTE — Progress Notes (Signed)
Pharmacy Antibiotic Note  Montgomery Favor is a 63 y.o. male admitted on 12/28/2018 with sepsis.  Pharmacy has been consulted for vanc/cefepime dosing.  He was recently dc due to bacteremia with strep anginosis and bacteroides. He was readmitted again after discharge and started on vanc/cefepime/flagyl. Porta cath has been removed and ID will be consulted on the patient. His renal fxn has been stable. We will hold vanc levels until further plan by ID.  Plan:  D6 abx, to Ertapenem 1gm q24  D4 Eraxis 200mg  x1, then 100mg  q24  OPAT for Ertapenem, x 6wk, last dose 8/8  Need TEE, Sens requested for Candida glabrate 6/30 - in process  PICC removed 6/30  ID following  Height: 6\' 5"  (195.6 cm) Weight: 195 lb 5.2 oz (88.6 kg) IBW/kg (Calculated) : 89.1  Temp (24hrs), Avg:98.1 F (36.7 C), Min:97.9 F (36.6 C), Max:98.2 F (36.8 C)  Recent Labs  Lab 12/28/18 1708  12/28/18 2334 12/29/18 0250 12/30/18 0429 12/31/18 0327 01/01/19 0440 01/02/19 0521 01/03/19 0616  WBC  --    < >  --  3.5* 2.3* 2.4* 2.3* 3.5* 4.2  CREATININE  --    < >  --  0.78 0.61 0.63 0.61 0.55* 0.59*  LATICACIDVEN 1.0  --  0.6 0.6  --   --   --   --   --    < > = values in this interval not displayed.    Estimated Creatinine Clearance: 121.5 mL/min (A) (by C-G formula based on SCr of 0.59 mg/dL (L)).    No Known Allergies   Antimicrobials this admission: 6/29 eraxis >> 6/29 ertapenem >> 6/26 vanc >> 6/29 6/26 cefepime >> 6/29 6/27 metronidazole >> 6/29  Recent admission (just d/c 6/25): 6/22 Cefepime>>6/25 6/22 Vanc>>6/25 6/25 Rocephin for strep bacteremia -sent home on this - only in 1/3 bottles - ID involved (supposed to end 7/5)  Dose adjustments this admission:  Microbiology results: 6/30 C.diff: Negative 6/29 repeat BCx: ngtd 6/26 BCx: C. Glabrata 6/29 peritoneal fluid: ngtd   Minda Ditto PharmD 01/03/2019 8:08 AM

## 2019-01-03 NOTE — Progress Notes (Signed)
PHARMACY CONSULT NOTE FOR:  OUTPATIENT  PARENTERAL ANTIBIOTIC THERAPY (OPAT)  Indication: polymicrobial sepsis and candidemia Regimen: Ertapenem 1gm IV q24h with end date 01/26/2019 and caspofungin 50mg  daily until 02/09/2019  IV antibiotic discharge orders are pended. To discharging provider:  please sign these orders via discharge navigator,  Select New Orders & click on the button choice - Manage This Unsigned Work.     Thank you for allowing pharmacy to be a part of this patient's care.  Doreene Eland, PharmD, BCPS.   Work Cell: [phone] 01/03/2019 4:34 PM

## 2019-01-03 NOTE — Progress Notes (Addendum)
Patient ID: Cory Kelly, male   DOB: 01/23/1956, 63 y.o.   MRN: [id number]     Note: Anidulafungin will be changed to caspofungin 50 mg IV daily at time of hospital d/c per OP infusion company formulary restrictions. End dates for ertapenem and antifungal therapy are correct though. OPAT orders completed on 01/03/2019. > 20 minutes of time spent on d/c planning alone for the patient today.    [facility]  Date of Admission:  12/28/2018   Total days of antibiotics 10        Day 2 ertapenem        Day 2 anidulafungin         ASSESSMENT: The patient is a 63 y/o WM with advanced pancreatic CA, known liver metastases, s/p bare metal biliary stenting who has had recurring admissions over the past 2 months with polymicrobial bloodstream infection.  He had group C strep bacteremia in early May treated with 2 weeks of ceftriaxone.  He was readmitted last week with strep anginosis and Bacteroides bacteremia.  He was readmitted on 12/28/2018 with fever.  He defervesced promptly with empiric broadening of his antibacterial therapy.  Recently 1 of 2 admission blood cultures was reported to be growing Candida glabrata from a peripheral stick blood culture.  I suspect that his infections are coming from a hepatobiliary source (either his bare metal stent or his known malignancy in this region disrupting the normal flora and allowing it to invade the vasculature).  Repeat blood cultures are showing NGTD thus far but would not expect growth until 48-72 hrs with this pathogen.  His PICC line was removed on 01/01/2019 and would wait to insert a new PICC until blood cultures are negative after 72 hrs.  Antimicrobial susceptibility testing of his Candida glabrata is pending (30% or more of isolates for this pathogen are azole resistant typically). Anidulafungin is usually sensitive, but we are trying to avoid a scenario where the patient must receive 2 IV antimicrobials unless absolutely necessary.  Based on current guidelines he should have an ophthalmologic exam sometime within the next 2 weeks to rule out endophthalmitis and help guide duration of antifungal therapy. Dr. Hale Bogus discussion with radiology earlier this week about his recent abdominal CT and MRI scans were inconclusive on identifying his precise source of infection, so his risk for further recurrences remains quite high over time. His recent CDT was negative for Ag and toxin, making CDI unlikely. Suspect his watery diarrhea is more pancreatic in nature.  PLAN: 1. Continue ertapenem and anidulafungin. As stated above, duration of treatment is unclear at this time while his repeat blood cx results are pending. As his repeat echo showed no abnormalities, he would likley be best served with 4 weeks of ertapenem and 6 weeks of anidulafungin from time of established clearance of blood borne pathogen. Tentative d/c date for ertapenem will be 01/26/2019 and tentative anidulafungin d/c date will be 02/09/2019. 2. Concern remains that his biliary stent may be the nidus of his infection but cannot be exchanged further. Once these infections become fungal in nature, efforts to prevent recurrence often fail unless the device can be removed or exchanged. 3. Removed PICC...delay placement of new PICC after repeat blood cxs are negative at 72 hrs (should be the case by tomorrow AM). 4. f/u antifungal susceptibilities of his Candida glabrata isolate (micro lab confirmed that isolate will be sent out in AM but do NOT expect result until late next week). 5. Recommend ophthalmologic evaluation  6. Will need to discuss nature of chemo re-initiation with his oncologist as currently on hold due to his infection. If future chemo is immunosuppressive or likely to induce neutropenia, premature resumption of chemo could worsen prognosis (fungemia alone carries a 50% mortality risk). He seems unwilling to consider palliation at this time. I am unclear of his  oncologic prognosis, so will defer this discussion to them. I spoke separately with the patient's wife on the phone following my examination today. She expressly asked about his mortality risks with infection and his CA treatment and understands that this will likely change his oncologic treatment and all-cause mortality.  Principal Problem:   Sepsis ([place]) Active Problems:   Essential hypertension   Hyperlipemia   Pancreatic cancer (HCC)   Fever, unspecified   Bacteroides infection   Streptococcal bacteremia   Candidemia ([place])   Scheduled Meds: . amLODipine  10 mg Oral QHS  . calamine   Topical BID  . doxazosin  1 mg Oral QHS  . enoxaparin (LOVENOX) injection  40 mg Subcutaneous Daily  . ferrous sulfate  325 mg Oral BID WC  . gabapentin  200 mg Oral BID  . irbesartan  300 mg Oral Daily  . potassium chloride SA  20 mEq Oral BID   Continuous Infusions: . anidulafungin 100 mg (01/02/19 2255)  . ertapenem 1,000 mg (01/02/19 1734)   PRN Meds:.acetaminophen **OR** acetaminophen, albuterol, alum & mag hydroxide-simeth, bisacodyl, guaiFENesin-dextromethorphan, hydrALAZINE, hydrocortisone, hydrocortisone cream, ipratropium, lip balm, loperamide, loratadine, magnesium citrate, Muscle Rub, ondansetron **OR** ondansetron (ZOFRAN) IV, oxyCODONE, oxyCODONE-acetaminophen **AND** oxyCODONE, phenol, polyvinyl alcohol, prochlorperazine, sodium chloride, sodium chloride flush, zolpidem   SUBJECTIVE: No bowel movements today thus far.  Appetite remains poor but he has been ambulating.  Review of Systems: Review of Systems  Constitutional: Negative for chills, diaphoresis and fever.  Respiratory: Negative for cough and shortness of breath.   Cardiovascular: Negative for chest pain.  Gastrointestinal: Positive for abdominal pain. Negative for diarrhea, nausea and vomiting.  All other systems reviewed and are negative.   No Known Allergies  OBJECTIVE: Vitals:   01/02/19 0559 01/02/19 1354  01/02/19 2106 01/03/19 0630  BP: 111/72 115/78 (!) 151/87 135/85  Pulse: 63 73 (!) 55 (!) 57  Resp: _0 Temp: 98.2 F (36.8 C) 98.1 F (36.7 C) 98.2 F (36.8 C) 97.9 F (36.6 C)  TempSrc: Oral Oral Oral Oral  SpO2: 95% 98% 96% 97%  Weight:      Height:       Body mass index is 23.16 kg/m.  Physical Exam Gen: pleasant, NAD, A&Ox 3 Head: NCAT, no temporal wasting evident EENT: PERRL, EOMI, MMM, adequate dentition Neck: supple, no JVD CV: NRRR, no murmurs evident Pulm: CTA bilaterally, no wheeze or retractions Abd: soft, NTND, +BS Extrems: 1+ LE edema, 2+ pulses Skin: no rashes, adequate skin turgor Neuro: CN II-XII grossly intact, no focal neurologic deficits appreciated, gait was not assessed, A&Ox 3  Lab Results Lab Results  Component Value Date   WBC 4.2 01/03/2019   HGB 10.1 (L) 01/03/2019   HCT 31.9 (L) 01/03/2019   MCV 88.6 01/03/2019   PLT 129 (L) 01/03/2019    Lab Results  Component Value Date   CREATININE 0.59 (L) 01/03/2019   BUN <5 (L) 01/01/2019   NA 133 (L) 01/01/2019   K 3.7 01/01/2019   CL 102 01/01/2019   CO2 24 01/01/2019    Lab Results  Component Value Date   ALT 43 01/01/2019  AST 40 01/01/2019   ALKPHOS 522 (H) 01/01/2019   BILITOT 0.6 01/01/2019     Microbiology: Recent Results (from the past 240 hour(s))  Culture, blood (routine x 2)     Status: None   Collection Time: 12/24/18  5:22 PM   Specimen: BLOOD  Result Value Ref Range Status   Specimen Description   Final    BLOOD LEFT ANTECUBITAL Performed at [facility], [place] [address] [place], [place] [address]    Special Requests   Final    BOTTLES DRAWN AEROBIC AND ANAEROBIC Blood Culture results may not be optimal due to an excessive volume of blood received in culture bottles Performed at [facility] [address] [place], [place] [address]    Culture   Final    NO GROWTH 5 DAYS Performed at [facility], [place] [address]  [place], [place] [address]    Report Status 12/29/2018 FINAL  Final  Culture, blood (routine x 2)     Status: None   Collection Time: 12/24/18  5:29 PM   Specimen: BLOOD  Result Value Ref Range Status   Specimen Description   Final    BLOOD RIGHT ANTECUBITAL Performed at [facility], [place] [address], [place], [place] [address]    Special Requests   Final    BOTTLES DRAWN AEROBIC AND ANAEROBIC Blood Culture adequate volume Performed at [facility] [address] [place], [place] [address]    Culture   Final    NO GROWTH 5 DAYS Performed at [facility], [place] [address], [place], [place] [address]    Report Status 12/29/2018 FINAL  Final  Culture, blood (Routine x 2)     Status: Abnormal (Preliminary result)   Collection Time: 12/28/18  5:50 PM   Specimen: BLOOD LEFT FOREARM  Result Value Ref Range Status   Specimen Description   Final    BLOOD LEFT FOREARM Performed at [facility], [place] [address] [place], [place] [address]    Special Requests   Final    BOTTLES DRAWN AEROBIC AND ANAEROBIC Blood Culture adequate volume Performed at [facility] [address], [place], [place] [address]    Culture  Setup Time   Final    IN BOTH AEROBIC AND ANAEROBIC BOTTLES YEAST CRITICAL RESULT CALLED TO, READ BACK BY AND VERIFIED WITH: Sheffield Slider [facility] 12/31/18 2103 JDW    Culture (A)  Final    CANDIDA GLABRATA Sent to [place] for further susceptibility testing. Performed at [facility], [place] [address] [place], [place] [address]    Report Status PENDING  Incomplete  Culture, blood (Routine x 2)     Status: None   Collection Time: 12/28/18  5:50 PM   Specimen: BLOOD  Result Value Ref Range Status   Specimen Description   Final    BLOOD RIGHT MIDLINE Performed at [facility] [address], [place], [place] [address]    Special Requests   Final    BOTTLES DRAWN AEROBIC AND ANAEROBIC Blood Culture  adequate volume Performed at [facility] [address] [place], [place] [address]    Culture   Final    NO GROWTH 5 DAYS Performed at [facility], [place] [address], [place], [place] [address]    Report Status 01/02/2019 FINAL  Final  Blood Culture ID Panel (Reflexed)     Status: Abnormal  Collection Time: 12/28/18  5:50 PM  Result Value Ref Range Status   Enterococcus species NOT DETECTED NOT DETECTED Final   Listeria monocytogenes NOT DETECTED NOT DETECTED Final   Staphylococcus species NOT DETECTED NOT DETECTED Final   Staphylococcus aureus (BCID) NOT DETECTED NOT DETECTED Final   Streptococcus species NOT DETECTED NOT DETECTED Final   Streptococcus agalactiae NOT DETECTED NOT DETECTED Final   Streptococcus pneumoniae NOT DETECTED NOT DETECTED Final   Streptococcus pyogenes NOT DETECTED NOT DETECTED Final   Acinetobacter baumannii NOT DETECTED NOT DETECTED Final   Enterobacteriaceae species NOT DETECTED NOT DETECTED Final   Enterobacter cloacae complex NOT DETECTED NOT DETECTED Final   Escherichia coli NOT DETECTED NOT DETECTED Final   Klebsiella oxytoca NOT DETECTED NOT DETECTED Final   Klebsiella pneumoniae NOT DETECTED NOT DETECTED Final   Proteus species NOT DETECTED NOT DETECTED Final   Serratia marcescens NOT DETECTED NOT DETECTED Final   Haemophilus influenzae NOT DETECTED NOT DETECTED Final   Neisseria meningitidis NOT DETECTED NOT DETECTED Final   Pseudomonas aeruginosa NOT DETECTED NOT DETECTED Final   Candida albicans NOT DETECTED NOT DETECTED Final   Candida glabrata DETECTED (A) NOT DETECTED Final    Comment: CRITICAL RESULT CALLED TO, READ BACK BY AND VERIFIED WITH: Sheffield Slider [facility] 12/31/18 2103 JDW    Candida krusei NOT DETECTED NOT DETECTED Final   Candida parapsilosis NOT DETECTED NOT DETECTED Final   Candida tropicalis NOT DETECTED NOT DETECTED Final    Comment: Performed at [facility], [address] [place],  [place] [address]  Antifungal AST 9 Drug Panel     Status: None (Preliminary result)   Collection Time: 12/28/18  5:50 PM  Result Value Ref Range Status   Organism ID, Yeast Preliminary report  Final    Comment: Specimen has been received and testing has been initiated.   Amphotericin B MIC PENDING  Incomplete   Please Note: Comment  Final    Comment: (NOTE) CLSI does not have established guidelines for interpretation of these organism-drug combinations. For research use only.    Anidulafungin MIC PENDING  Incomplete   Caspofungin MIC PENDING  Incomplete   Micafungin MIC PENDING  Incomplete   Posaconazole MIC PENDING  Incomplete   Please Note: Comment  Final    Comment: (NOTE) Results for this test are for research purposes only by the assay's manufacturer.  The performance characteristics of this product have not been established.  Results should not be used as a diagnostic procedure without confirmation of the diagnosis by another medically established diagnostic product or procedure. Performed At: [facility] [place], [place] [id number] Rush Farmer MD [id number]    Fluconazole Islt MIC PENDING  Incomplete   Flucytosine MIC PENDING  Incomplete   Itraconazole MIC PENDING  Incomplete   Voriconazole MIC PENDING  Incomplete   Source BLOOD  Final    Comment: Performed at [facility], [place] [address], [place], [place] [address]  SARS Coronavirus 2 (CEPHEID - Performed in [facility] hospital lab), Hosp Order     Status: None   Collection Time: 12/28/18  5:52 PM   Specimen: Nasopharyngeal Swab  Result Value Ref Range Status   SARS Coronavirus 2 NEGATIVE NEGATIVE Final    Comment: (NOTE) If result is NEGATIVE SARS-CoV-2 target nucleic acids are NOT DETECTED. The SARS-CoV-2 RNA is generally detectable in upper and lower  respiratory specimens during the acute phase of infection. The lowest  concentration of SARS-CoV-2 viral  copies this assay can detect  is 250  copies / mL. A negative result does not preclude SARS-CoV-2 infection  and should not be used as the sole basis for treatment or other  patient management decisions.  A negative result may occur with  improper specimen collection / handling, submission of specimen other  than nasopharyngeal swab, presence of viral mutation(s) within the  areas targeted by this assay, and inadequate number of viral copies  (<250 copies / mL). A negative result must be combined with clinical  observations, patient history, and epidemiological information. If result is POSITIVE SARS-CoV-2 target nucleic acids are DETECTED. The SARS-CoV-2 RNA is generally detectable in upper and lower  respiratory specimens dur ing the acute phase of infection.  Positive  results are indicative of active infection with SARS-CoV-2.  Clinical  correlation with patient history and other diagnostic information is  necessary to determine patient infection status.  Positive results do  not rule out bacterial infection or co-infection with other viruses. If result is PRESUMPTIVE POSTIVE SARS-CoV-2 nucleic acids MAY BE PRESENT.   A presumptive positive result was obtained on the submitted specimen  and confirmed on repeat testing.  While 2019 novel coronavirus  (SARS-CoV-2) nucleic acids may be present in the submitted sample  additional confirmatory testing may be necessary for epidemiological  and / or clinical management purposes  to differentiate between  SARS-CoV-2 and other Sarbecovirus currently known to infect humans.  If clinically indicated additional testing with an alternate test  methodology [phone]) is advised. The SARS-CoV-2 RNA is generally  detectable in upper and lower respiratory sp ecimens during the acute  phase of infection. The expected result is Negative. Fact Sheet for Patients:  StrictlyIdeas.no Fact Sheet for Healthcare Providers:  BankingDealers.co.za This test is not yet approved or cleared by the [place] FDA and has been authorized for detection and/or diagnosis of SARS-CoV-2 by FDA under an Emergency Use Authorization (EUA).  This EUA will remain in effect (meaning this test can be used) for the duration of the COVID-19 declaration under Section 564(b)(1) of the Act, 21 U.S.C. section 360bbb-3(b)(1), unless the authorization is terminated or revoked sooner. Performed at [facility], [place] [address] [place], [place] [address]   Culture, body fluid-bottle     Status: None (Preliminary result)   Collection Time: 12/31/18 12:51 PM   Specimen: Peritoneal Washings  Result Value Ref Range Status   Specimen Description PERITONEAL  Final   Special Requests NONE  Final   Culture   Final    NO GROWTH 3 DAYS Performed at [facility], [address], [place], [place] [address]    Report Status PENDING  Incomplete  Gram stain     Status: None   Collection Time: 12/31/18 12:51 PM   Specimen: Peritoneal Washings  Result Value Ref Range Status   Specimen Description PERITONEAL  Final   Special Requests NONE  Final   Gram Stain   Final    NO WBC SEEN NO ORGANISMS SEEN Performed at [facility], [address], [place], [place] [address]    Report Status 12/31/2018 FINAL  Final  Culture, blood (routine x 2)     Status: None (Preliminary result)   Collection Time: 12/31/18  9:54 PM   Specimen: BLOOD  Result Value Ref Range Status   Specimen Description   Final    BLOOD LEFT ANTECUBITAL Performed at [facility] [address] [place] [address], [place], [place] [address]  Special Requests   Final    BOTTLES DRAWN AEROBIC AND ANAEROBIC Blood Culture adequate volume Performed at [facility] [address], [place], [place] [address]    Culture   Final    NO GROWTH 3 DAYS Performed at [facility], [place] [address]  [place], [place] [address]    Report Status PENDING  Incomplete  Culture, blood (routine x 2)     Status: None (Preliminary result)   Collection Time: 12/31/18  9:54 PM   Specimen: BLOOD  Result Value Ref Range Status   Specimen Description   Final    BLOOD BLOOD RIGHT HAND Performed at [facility] [address], [place], [place] [address]    Special Requests   Final    BOTTLES DRAWN AEROBIC AND ANAEROBIC Blood Culture adequate volume Performed at [facility] [place] [address] [place], [place] [address]    Culture   Final    NO GROWTH 3 DAYS Performed at [facility], [place] [address], [place], [place] [address]    Report Status PENDING  Incomplete  C difficile quick scan w PCR reflex     Status: None   Collection Time: 01/01/19 11:33 AM   Specimen: STOOL  Result Value Ref Range Status   C Diff antigen NEGATIVE NEGATIVE Final   C Diff toxin NEGATIVE NEGATIVE Final   C Diff interpretation No C. difficile detected.  Final    Comment: Performed at [facility], [place] [address], [place], [place] [address]  OPAT ORDERS:  Diagnosis: polymicrobial sepsis/fungemia  Culture Result: GCS, Strep anginosus, and Candida glabrata, Bacteroides fragilis  No Known Allergies  Discharge antibiotics: Per pharmacy protocol ertapenem 1 gm IV daily AND caspofungin 100 mg IV daily  Duration/End Date: Ertapenem until 01/26/2019 (4 weeks) Caspofungin until 02/09/2019 (6 weeks)   [facility] Care and Maintenance Per Protocol __ Please pull PIC at completion of IV antibiotics _X_ Please leave PIC in place until doctor has seen patient or been notified  Labs weekly while on IV antibiotics: _X_ CBC with differential __ BMP __ BMP TWICE WEEKLY** _X_ CMP __ CRP __ ESR __ Vancomycin trough  Fax weekly labs to [phone]  Clinic Follow Up Appt: 01/21/2019 at 10:30 AM @ [facility] with Dr. Hilton Sinclair, [place] for  [place] [phone] pager   [phone] cell 01/03/2019, 2:25 PM

## 2019-01-03 NOTE — Progress Notes (Signed)
[facility]                                                                              Patient Demographics  Markavious Micco, is a 63 y.o. male, DOB - 20-Apr-1956, LEX:[id number]  Admit date - 12/28/2018   Admitting Physician Harvie Bridge, DO  Outpatient Primary MD for the patient is Gale Journey, Damaris Hippo, PA-C  Outpatient specialists:   LOS - 6  days   Medical records reviewed and are as summarized below:    Chief Complaint  Patient presents with   Fever   Abnormal Lab   Cancer patient       Brief summary   63 year old with history of metastatic pancreatic cancer status post biliary stent, essential hypertension, hyperlipidemia returned back to the hospital with fevers and chills.  He was recently discharged here from the hospital after being treated for gram-negative and Streptococcus bacteremia with IV Rocephin.  Upon readmission was started on broad-spectrum antibiotics.  ID recommended ertapenem but his cultures also started growing Candida therefore started on antifungal. S/p Paracentesis on 6/29.    Assessment & Plan    Principal problem Sepsis, POA, Streptococcus bacteremia,, Candida fungemia - Blood cultures 6/29 showed Candida glabrata - Patient has recurrent admissions for the past 2 months with polymicrobial bacteremia.  -Status post paracentesis 6/29 cultures negative so far.  No abdominal tenderness -ID consulted, recommended removing PICC line.  C. difficile screen is negative. -2D echo showed EF of 55 to 60%, limited echo to evaluate for possible endocarditis, no evidence of valvular vegetation. -Seen by ophthalmology, no acute issues currently -Discussed with infectious disease, Dr. Prince Rome today, recommended to continue ertapenem and anidulafungin, recommended 4 weeks of ertapenem in 6 weeks of anidulafungin  -Possible PICC placement in a.m., awaiting antifungal susceptibilities of Candida glabrata isolate  Active problems Normocytic  anemia, iron deficiency -Likely from anemia of chronic disease, metastatic pancreatic CA  History of pancreatic CA with metastasis -Oncology following, Dr. Learta Codding, chemotherapy will remain on hold due to sepsis -Port-A-Cath was removed previous admission, will need new IV access prior to starting chemotherapy  Constipation -Continue bowel regimen  Nonobstructive bilateral kidney stones -Currently no pain, renal function normal  Peripheral neuropathy -Continue gabapentin   Essential hypertension BP currently stable  Code Status: Full code* DVT Prophylaxis:  Lovenox  Family Communication: Discussed in detail with the patient, all imaging results, lab results explained to the patient   Disposition Plan: Not medically ready, needs to streamline antibiotics, needs ophthalmology examination, will likely need PICC line for long-term antibiotics  Time Spent in minutes 25 minutes  Procedures:  None  Consultants:   Infectious disease Oncology Ophthalmology  Antimicrobials:   Anti-infectives (From admission, onward)   Start     Dose/Rate Route Frequency Ordered Stop   01/01/19 2200  anidulafungin (ERAXIS) 100 mg in sodium chloride 0.9 % 100 mL IVPB     100 mg 78 mL/hr over 100 Minutes Intravenous Every 24 hours 12/31/18 2134     12/31/18 2200  anidulafungin (ERAXIS) 200 mg in sodium chloride 0.9 % 200 mL IVPB     200 mg 78 mL/hr over 200  Minutes Intravenous  Once 12/31/18 2134 01/01/19 0142   12/31/18 1800  ertapenem (INVANZ) 1,000 mg in sodium chloride 0.9 % 100 mL IVPB     1 g 200 mL/hr over 30 Minutes Intravenous Every 24 hours 12/31/18 1700     12/30/18 1200  vancomycin (VANCOCIN) 1,500 mg in sodium chloride 0.9 % 500 mL IVPB  Status:  Discontinued     1,500 mg 250 mL/hr over 120 Minutes Intravenous 2 times daily 12/30/18 1153 12/31/18 1700   12/29/18 0600  metroNIDAZOLE (FLAGYL) IVPB 500 mg  Status:  Discontinued     500 mg 100 mL/hr over 60 Minutes Intravenous  Every 8 hours 12/28/18 2237 12/31/18 1700   12/29/18 0200  vancomycin (VANCOCIN) 1,500 mg in sodium chloride 0.9 % 500 mL IVPB  Status:  Discontinued     1,500 mg 250 mL/hr over 120 Minutes Intravenous Every 12 hours 12/28/18 2010 12/30/18 1143   12/29/18 0200  ceFEPIme (MAXIPIME) 2 g in sodium chloride 0.9 % 100 mL IVPB  Status:  Discontinued     2 g 200 mL/hr over 30 Minutes Intravenous Every 8 hours 12/28/18 2010 12/31/18 1700   12/28/18 2245  ceFEPIme (MAXIPIME) 2 g in sodium chloride 0.9 % 100 mL IVPB  Status:  Discontinued     2 g 200 mL/hr over 30 Minutes Intravenous  Once 12/28/18 2237 12/28/18 2241   12/28/18 2245  vancomycin (VANCOCIN) IVPB 1000 mg/200 mL premix  Status:  Discontinued     1,000 mg 200 mL/hr over 60 Minutes Intravenous  Once 12/28/18 2237 12/28/18 2242   12/28/18 1800  vancomycin (VANCOCIN) IVPB 1000 mg/200 mL premix     1,000 mg 200 mL/hr over 60 Minutes Intravenous  Once 12/28/18 1750 12/28/18 1953   12/28/18 1800  piperacillin-tazobactam (ZOSYN) IVPB 3.375 g     3.375 g 100 mL/hr over 30 Minutes Intravenous  Once 12/28/18 1750 12/28/18 1847         Medications  Scheduled Meds:  amLODipine  10 mg Oral QHS   calamine   Topical BID   doxazosin  1 mg Oral QHS   enoxaparin (LOVENOX) injection  40 mg Subcutaneous Daily   ferrous sulfate  325 mg Oral BID WC   gabapentin  200 mg Oral BID   irbesartan  300 mg Oral Daily   potassium chloride SA  20 mEq Oral BID   Continuous Infusions:  anidulafungin 100 mg (01/02/19 2255)   ertapenem 1,000 mg (01/02/19 1734)   PRN Meds:.acetaminophen **OR** acetaminophen, albuterol, alum & mag hydroxide-simeth, bisacodyl, guaiFENesin-dextromethorphan, hydrALAZINE, hydrocortisone, hydrocortisone cream, ipratropium, lip balm, loperamide, loratadine, magnesium citrate, Muscle Rub, ondansetron **OR** ondansetron (ZOFRAN) IV, oxyCODONE, oxyCODONE-acetaminophen **AND** oxyCODONE, phenol, polyvinyl alcohol,  prochlorperazine, sodium chloride, sodium chloride flush, zolpidem      Subjective:   Kiegan Macaraeg was seen and examined today.  No new complaints, getting frustrated, wants to go home soon.  No acute visual changes or fevers.  Patient denies dizziness, chest pain, shortness of breath, abdominal pain, N/V/D/C, new weakness, numbess, tingling. No acute events overnight.    Objective:   Vitals:   01/02/19 0559 01/02/19 1354 01/02/19 2106 01/03/19 0630  BP: 111/72 115/78 (!) 151/87 135/85  Pulse: 63 73 (!) 55 (!) 57  Resp: 16 15 18 18   Temp: 98.2 F (36.8 C) 98.1 F (36.7 C) 98.2 F (36.8 C) 97.9 F (36.6 C)  TempSrc: Oral Oral Oral Oral  SpO2: 95% 98% 96% 97%  Weight:  Height:        Intake/Output Summary (Last 24 hours) at 01/03/2019 1427 Last data filed at 01/03/2019 1203 Gross per 24 hour  Intake 625.27 ml  Output 200 ml  Net 425.27 ml     Wt Readings from Last 3 Encounters:  12/28/18 88.6 kg  12/24/18 88.6 kg  12/24/18 88.6 kg   Physical Exam  General: Alert and oriented x 3, NAD  Eyes:   HEENT:  Atraumatic, normocephalic  Cardiovascular: S1 S2 clear, RRR. No pedal edema b/l  Respiratory: CTAB, no wheezing, rales or rhonchi  Gastrointestinal: Soft, nontender, nondistended, NBS  Ext: no pedal edema bilaterally  Neuro: no new deficits  Musculoskeletal: No cyanosis, clubbing  Skin: No rashes  Psych: Normal affect and demeanor, alert and oriented x3      Data Reviewed:  I have personally reviewed following labs and imaging studies  Micro Results Recent Results (from the past 240 hour(s))  Culture, blood (routine x 2)     Status: None   Collection Time: 12/24/18  5:22 PM   Specimen: BLOOD  Result Value Ref Range Status   Specimen Description   Final    BLOOD LEFT ANTECUBITAL Performed at [facility], [address], [place], [place] [address]    Special Requests   Final    BOTTLES DRAWN AEROBIC AND ANAEROBIC Blood Culture results  may not be optimal due to an excessive volume of blood received in culture bottles Performed at [place] [address] [place], [place] [address]    Culture   Final    NO GROWTH 5 DAYS Performed at [facility], [place] [address] [place], [place] [address]    Report Status 12/29/2018 FINAL  Final  Culture, blood (routine x 2)     Status: None   Collection Time: 12/24/18  5:29 PM   Specimen: BLOOD  Result Value Ref Range Status   Specimen Description   Final    BLOOD RIGHT ANTECUBITAL Performed at [facility], [place] [address] [place], [place] [address]    Special Requests   Final    BOTTLES DRAWN AEROBIC AND ANAEROBIC Blood Culture adequate volume Performed at [place] [address] [place], [place] [address]    Culture   Final    NO GROWTH 5 DAYS Performed at [facility], [place] [address] [place], [place] [address]    Report Status 12/29/2018 FINAL  Final  Culture, blood (Routine x 2)     Status: Abnormal (Preliminary result)   Collection Time: 12/28/18  5:50 PM   Specimen: BLOOD LEFT FOREARM  Result Value Ref Range Status   Specimen Description   Final    BLOOD LEFT FOREARM Performed at [facility], [place] [address] [place], [place] [address]    Special Requests   Final    BOTTLES DRAWN AEROBIC AND ANAEROBIC Blood Culture adequate volume Performed at [place] [address] [place], [place] [address]    Culture  Setup Time   Final    IN BOTH AEROBIC AND ANAEROBIC BOTTLES YEAST CRITICAL RESULT CALLED TO, READ BACK BY AND VERIFIED WITH: Sheffield Slider [facility] 12/31/18 2103 JDW    Culture (A)  Final    CANDIDA GLABRATA Sent to [place] for further susceptibility testing. Performed at [facility], [place] [address], [place], [place] [address]    Report Status PENDING  Incomplete  Culture, blood (Routine x  2)     Status: None   Collection Time: 12/28/18  5:50 PM   Specimen:  BLOOD  Result Value Ref Range Status   Specimen Description   Final    BLOOD RIGHT MIDLINE Performed at [facility] [address], [place], [place] [address]    Special Requests   Final    BOTTLES DRAWN AEROBIC AND ANAEROBIC Blood Culture adequate volume Performed at [place] [address], [place], [place] [address]    Culture   Final    NO GROWTH 5 DAYS Performed at [facility], [place] [address], [place], [place] [address]    Report Status 01/02/2019 FINAL  Final  Blood Culture ID Panel (Reflexed)     Status: Abnormal   Collection Time: 12/28/18  5:50 PM  Result Value Ref Range Status   Enterococcus species NOT DETECTED NOT DETECTED Final   Listeria monocytogenes NOT DETECTED NOT DETECTED Final   Staphylococcus species NOT DETECTED NOT DETECTED Final   Staphylococcus aureus (BCID) NOT DETECTED NOT DETECTED Final   Streptococcus species NOT DETECTED NOT DETECTED Final   Streptococcus agalactiae NOT DETECTED NOT DETECTED Final   Streptococcus pneumoniae NOT DETECTED NOT DETECTED Final   Streptococcus pyogenes NOT DETECTED NOT DETECTED Final   Acinetobacter baumannii NOT DETECTED NOT DETECTED Final   Enterobacteriaceae species NOT DETECTED NOT DETECTED Final   Enterobacter cloacae complex NOT DETECTED NOT DETECTED Final   Escherichia coli NOT DETECTED NOT DETECTED Final   Klebsiella oxytoca NOT DETECTED NOT DETECTED Final   Klebsiella pneumoniae NOT DETECTED NOT DETECTED Final   Proteus species NOT DETECTED NOT DETECTED Final   Serratia marcescens NOT DETECTED NOT DETECTED Final   Haemophilus influenzae NOT DETECTED NOT DETECTED Final   Neisseria meningitidis NOT DETECTED NOT DETECTED Final   Pseudomonas aeruginosa NOT DETECTED NOT DETECTED Final   Candida albicans NOT DETECTED NOT DETECTED Final   Candida glabrata DETECTED (A) NOT DETECTED Final    Comment: CRITICAL RESULT CALLED TO, READ BACK BY AND VERIFIED WITH: Sheffield Slider [facility] 12/31/18 2103 JDW    Candida krusei NOT DETECTED NOT DETECTED Final   Candida parapsilosis NOT DETECTED NOT DETECTED Final   Candida tropicalis NOT DETECTED NOT DETECTED Final    Comment: Performed at [facility], [address], [place], [place] [address]  Antifungal AST 9 Drug Panel     Status: None (Preliminary result)   Collection Time: 12/28/18  5:50 PM  Result Value Ref Range Status   Organism ID, Yeast Preliminary report  Final    Comment: Specimen has been received and testing has been initiated.   Amphotericin B MIC PENDING  Incomplete   Please Note: Comment  Final    Comment: (NOTE) CLSI does not have established guidelines for interpretation of these organism-drug combinations. For research use only.    Anidulafungin MIC PENDING  Incomplete   Caspofungin MIC PENDING  Incomplete   Micafungin MIC PENDING  Incomplete   Posaconazole MIC PENDING  Incomplete   Please Note: Comment  Final    Comment: (NOTE) Results for this test are for research purposes only by the assay's manufacturer.  The performance characteristics of this product have not been established.  Results should not be used as a diagnostic procedure without confirmation of the diagnosis by another medically established diagnostic product or procedure. Performed At: [facility] [place], [place] [id number] Rush Farmer MD ER:[id number]    Fluconazole Islt [place] PENDING  Incomplete   Flucytosine MIC PENDING  Incomplete   Itraconazole MIC PENDING  Incomplete   Voriconazole MIC PENDING  Incomplete   Source BLOOD  Final    Comment: Performed at [facility], [place] [address], [place], [place] [address]  SARS Coronavirus 2 (CEPHEID - Performed in [facility] lab), Hosp Order     Status: None   Collection Time: 12/28/18  5:52 PM   Specimen: Nasopharyngeal Swab  Result Value Ref Range Status   SARS Coronavirus 2 NEGATIVE NEGATIVE Final    Comment:  (NOTE) If result is NEGATIVE SARS-CoV-2 target nucleic acids are NOT DETECTED. The SARS-CoV-2 RNA is generally detectable in upper and lower  respiratory specimens during the acute phase of infection. The lowest  concentration of SARS-CoV-2 viral copies this assay can detect is 250  copies / mL. A negative result does not preclude SARS-CoV-2 infection  and should not be used as the sole basis for treatment or other  patient management decisions.  A negative result may occur with  improper specimen collection / handling, submission of specimen other  than nasopharyngeal swab, presence of viral mutation(s) within the  areas targeted by this assay, and inadequate number of viral copies  (<250 copies / mL). A negative result must be combined with clinical  observations, patient history, and epidemiological information. If result is POSITIVE SARS-CoV-2 target nucleic acids are DETECTED. The SARS-CoV-2 RNA is generally detectable in upper and lower  respiratory specimens dur ing the acute phase of infection.  Positive  results are indicative of active infection with SARS-CoV-2.  Clinical  correlation with patient history and other diagnostic information is  necessary to determine patient infection status.  Positive results do  not rule out bacterial infection or co-infection with other viruses. If result is PRESUMPTIVE POSTIVE SARS-CoV-2 nucleic acids MAY BE PRESENT.   A presumptive positive result was obtained on the submitted specimen  and confirmed on repeat testing.  While 2019 novel coronavirus  (SARS-CoV-2) nucleic acids may be present in the submitted sample  additional confirmatory testing may be necessary for epidemiological  and / or clinical management purposes  to differentiate between  SARS-CoV-2 and other Sarbecovirus currently known to infect humans.  If clinically indicated additional testing with an alternate test  methodology [phone]) is advised. The SARS-CoV-2 RNA is  generally  detectable in upper and lower respiratory sp ecimens during the acute  phase of infection. The expected result is Negative. Fact Sheet for Patients:  StrictlyIdeas.no Fact Sheet for Healthcare Providers: BankingDealers.co.za This test is not yet approved or cleared by the [place] FDA and has been authorized for detection and/or diagnosis of SARS-CoV-2 by FDA under an Emergency Use Authorization (EUA).  This EUA will remain in effect (meaning this test can be used) for the duration of the COVID-19 declaration under Section 564(b)(1) of the Act, 21 U.S.C. section 360bbb-3(b)(1), unless the authorization is terminated or revoked sooner. Performed at [facility], [place] [address] [place], [place] [address]   Culture, body fluid-bottle     Status: None (Preliminary result)   Collection Time: 12/31/18 12:51 PM   Specimen: Peritoneal Washings  Result Value Ref Range Status   Specimen Description PERITONEAL  Final   Special Requests NONE  Final   Culture   Final    NO GROWTH 3 DAYS Performed at [facility], [address], [place], [place] [address]    Report Status PENDING  Incomplete  Gram stain  Status: None   Collection Time: 12/31/18 12:51 PM   Specimen: Peritoneal Washings  Result Value Ref Range Status   Specimen Description PERITONEAL  Final   Special Requests NONE  Final   Gram Stain   Final    NO WBC SEEN NO ORGANISMS SEEN Performed at [facility], [address], [place], [place] [address]    Report Status 12/31/2018 FINAL  Final  Culture, blood (routine x 2)     Status: None (Preliminary result)   Collection Time: 12/31/18  9:54 PM   Specimen: BLOOD  Result Value Ref Range Status   Specimen Description   Final    BLOOD LEFT ANTECUBITAL Performed at [facility] [address], [place], [place] [address]    Special Requests   Final    BOTTLES  DRAWN AEROBIC AND ANAEROBIC Blood Culture adequate volume Performed at [facility] [address] [place], [place] [address]    Culture   Final    NO GROWTH 3 DAYS Performed at [facility], [place] [address], [place], [place] [address]    Report Status PENDING  Incomplete  Culture, blood (routine x 2)     Status: None (Preliminary result)   Collection Time: 12/31/18  9:54 PM   Specimen: BLOOD  Result Value Ref Range Status   Specimen Description   Final    BLOOD BLOOD RIGHT HAND Performed at [facility] [address], [place], [place] [address]    Special Requests   Final    BOTTLES DRAWN AEROBIC AND ANAEROBIC Blood Culture adequate volume Performed at [place] [address] [place], [place] [address]    Culture   Final    NO GROWTH 3 DAYS Performed at [facility], [place] [address], [place], [place] [address]    Report Status PENDING  Incomplete  C difficile quick scan w PCR reflex     Status: None   Collection Time: 01/01/19 11:33 AM   Specimen: STOOL  Result Value Ref Range Status   C Diff antigen NEGATIVE NEGATIVE Final   C Diff toxin NEGATIVE NEGATIVE Final   C Diff interpretation No C. difficile detected.  Final    Comment: Performed at [facility], [place] [address], [place], [place] [address]    Radiology Reports Dg Chest 2 View  Result Date: 12/28/2018 CLINICAL DATA:  Sepsis EXAM: CHEST - 2 VIEW COMPARISON:  December 24, 2018 FINDINGS: The right-sided Port-A-Cath has been removed. There is a new right-sided PICC line with tip terminating over the distal SVC. The cardiac size is stable. There is no pneumothorax. No large pleural effusion. The lung volumes are slightly low. IMPRESSION: No active cardiopulmonary disease. Electronically Signed   By: Constance Holster M.D.   On: 12/28/2018 18:53   Ct Abdomen Pelvis W Contrast  Result Date: 12/24/2018 CLINICAL DATA:  Abdominal  distension, fever, history of pancreatic cancer EXAM: CT ABDOMEN AND PELVIS WITH CONTRAST TECHNIQUE: Multidetector CT imaging of the abdomen and pelvis was performed using the standard protocol following bolus administration of intravenous contrast. CONTRAST:  153mL OMNIPAQUE IOHEXOL 300 MG/ML  SOLN COMPARISON:  MR abdomen pelvis, 12/04/2018, 10/30/2018 FINDINGS: Lower chest: No acute abnormality. Hepatobiliary: Known liver metastases are poorly appreciated by single-phase CT, for example in the dome of the liver (series 2, image 14). No gallstones or gallbladder wall thickening. Common bile duct stent is positioned tip in the descending portion of the  duodenum with post stenting pneumobilia. Pancreas: Discrete pancreatic head mass is not well appreciated, primarily appreciated by mass effect on the confluence of the portal vein (series 2, image 38. There is atrophy of the pancreatic body and tail and dilatation pancreatic duct. Spleen: Splenomegaly. Adrenals/Urinary Tract: Adrenal glands are unremarkable. Nonobstructive bilateral nephrolithiasis. Bladder is unremarkable. Stomach/Bowel: Stomach is within normal limits. Appendix is not clearly visualized. No evidence of bowel wall thickening, distention, or inflammatory changes. Large burden of stool in the distal colon and rectum. Vascular/Lymphatic: Aortic atherosclerosis. No enlarged abdominal or pelvic lymph nodes. Reproductive: No mass or other significant abnormality. Other: No abdominal wall hernia or abnormality. Moderate volume ascites. Musculoskeletal: No acute or significant osseous findings. IMPRESSION: 1. Redemonstrated pancreatic malignancy with hepatic metastases, status post common bile duct stenting. Primary mass and hepatic metastases are better appreciated by recent multiphasic MRI. 2.  Moderate volume ascites, which is similar to prior MRI. 3.  Splenomegaly. 4.  Large burden of stool in the distal colon and rectum. 5.  Nonobstructive bilateral  nephrolithiasis. Electronically Signed   By: Eddie Candle M.D.   On: 12/24/2018 14:35   US Paracentesis  Result Date: 12/31/2018 INDICATION: Patient with history of metastatic pancreatic cancer, ascites. Request made for diagnostic and therapeutic paracentesis. EXAM: ULTRASOUND GUIDED DIAGNOSTIC AND THERAPEUTIC PARACENTESIS MEDICATIONS: None COMPLICATIONS: None immediate. PROCEDURE: Informed written consent was obtained from the patient after a discussion of the risks, benefits and alternatives to treatment. A timeout was performed prior to the initiation of the procedure. Initial ultrasound scanning demonstrates a small to moderate amount of ascites within the right upper to mid abdominal quadrant. The right upper to mid abdomen was prepped and draped in the usual sterile fashion. 1% lidocaine was used for local anesthesia. Following this, a 19 gauge, 7-cm, Yueh catheter was introduced. An ultrasound image was saved for documentation purposes. The paracentesis was performed. The catheter was removed and a dressing was applied. The patient tolerated the procedure well without immediate post procedural complication. FINDINGS: A total of approximately 2.6 liters of slightly hazy, light yellow fluid was removed. Samples were sent to the laboratory as requested by the clinical team. IMPRESSION: Successful ultrasound-guided diagnostic and therapeutic paracentesis yielding 2.6 liters of peritoneal fluid. Read by: Rowe Robert, PA-C Electronically Signed   By: Sandi Mariscal M.D.   On: 12/31/2018 12:04   Ir Removal [employer] W/ [place] W/o Fl Mod Sed  Result Date: 12/27/2018 CLINICAL DATA:  Bacteremia. Indwelling right IJ port catheter placed 08/30/2017 by Dr. Pascal Lux, has been working well without evident complication , removal requested EXAM: EXAM TUNNELED PORT CATHETER REMOVAL TECHNIQUE: The procedure, risks (including but not limited to bleeding, infection, organ damage ), benefits, and alternatives were explained to  the patient. Questions regarding the procedure were encouraged and answered. The patient understands and consents to the procedure. Intravenous Fentanyl 133mcg and Versed 4mg  were administered as conscious sedation during continuous monitoring of the patient's level of consciousness and physiological / cardiorespiratory status by the radiology RN, with a total moderate sedation time of 37 minutes. Overlying skin prepped with chlorhexidine, draped in usual sterile fashion, infiltrated locally with 1% lidocaine. A small incision was made over the scar from previous placement. The port catheter was dissected free from the underlying soft tissues and removed intact. There is no gross purulence. Hemostasis was achieved. The port pocket was closed with deep interrupted and subcuticular continuous 3-0 Monocryl sutures, then covered with Dermabond. The patient tolerated the procedure well. COMPLICATIONS: COMPLICATIONS  None immediate IMPRESSION: 1.  Technically successful tunneled Port catheter removal. Electronically Signed   By: Lucrezia Europe M.D.   On: 12/27/2018 16:14   Dg Chest Port 1 View  Result Date: 12/28/2018 CLINICAL DATA:  Sepsis hypertension EXAM: PORTABLE CHEST 1 VIEW COMPARISON:  12/28/2018, 12/24/2018 FINDINGS: Right upper extremity catheter tip overlies the SVC. No acute consolidation or effusion. Normal cardiomediastinal silhouette. No pneumothorax. IMPRESSION: No active disease. Electronically Signed   By: Donavan Foil M.D.   On: 12/28/2018 23:03   Dg Chest Portable 1 View  Result Date: 12/24/2018 CLINICAL DATA:  Fever and weakness. EXAM: PORTABLE CHEST 1 VIEW COMPARISON:  11/04/2018 FINDINGS: The cardiac silhouette, mediastinal and hilar contours are stable. Mild cardiac enlargement and mild tortuosity of the thoracic aorta. The right IJ power port is stable. The lungs are clear. No pleural effusions. The bony thorax is intact. IMPRESSION: No acute cardiopulmonary findings. Electronically Signed    By: Marijo Sanes M.D.   On: 12/24/2018 13:59   Dg Ercp  Result Date: 12/06/2018 CLINICAL DATA:  63 year old male with a history of pancreatic cancer and obstructed jaundice with prior stent placement and progressive liver enzyme elevation. EXAM: ERCP TECHNIQUE: Multiple spot images obtained with the fluoroscopic device and submitted for interpretation post-procedure. FLUOROSCOPY TIME:  Fluoroscopy Time:  3 minutes 43 seconds Radiation Exposure Index (if provided by the fluoroscopic device): 66.7 mGy COMPARISON:  MRCP 12/04/2018 FINDINGS: A total of 4 intraoperative saved images are submitted for review. The images demonstrate a metal stent in the common hepatic and common bile ducts with a flexible endoscope in the descending duodenum. Subsequent images demonstrate wire cannulation of the stent and balloon dilation. IMPRESSION: ERCP with cannulation of the previously placed metal stent and balloon dilatation. These images were submitted for radiologic interpretation only. Please see the procedural report for the amount of contrast and the fluoroscopy time utilized. Electronically Signed   By: Jacqulynn Cadet M.D.   On: 12/06/2018 11:51   [place] Ascites (abdomen Limited)  Result Date: 12/25/2018 CLINICAL DATA:  63 year old male with abdominal distension, ascites EXAM: LIMITED ABDOMEN ULTRASOUND FOR ASCITES TECHNIQUE: Limited ultrasound survey for ascites was performed in all four abdominal quadrants. COMPARISON:  Prior CT scan of the abdomen and pelvis 12/24/2018 FINDINGS: Sonographic interrogation was performed of the 4 quadrants of the abdomen. There is trace perihepatic fluid in the right upper quadrant with multiple small loops of bowel. Overall, fluid volume is low. No safe window for paracentesis. IMPRESSION: Small volume ascites, insufficient for paracentesis. Electronically Signed   By: Jacqulynn Cadet M.D.   On: 12/25/2018 12:13   Ir Picc Placement Right >5 Yrs Inc Img Guide  Result Date:  12/27/2018 CLINICAL DATA:  Bacteremia, needs venous access for planned chemotherapy Monday EXAM: PICC PLACEMENT WITH ULTRASOUND AND FLUOROSCOPY FLUOROSCOPY TIME:  6 seconds, 1 mGy TECHNIQUE: After written informed consent was obtained, patient was placed in the supine position on angiographic table. Patency of the right basilic vein was confirmed with ultrasound with image documentation. An appropriate skin site was determined. Skin site was marked. Region was prepped using maximum barrier technique including cap and mask, sterile gown, sterile gloves, large sterile sheet, and Chlorhexidine as cutaneous antisepsis. The region was infiltrated locally with 1% lidocaine. Under real-time ultrasound guidance, the right basilic vein was accessed with a 21 gauge micropuncture needle; the needle tip within the vein was confirmed with ultrasound image documentation. Needle exchanged over a 018 guidewire for a peel-away sheath, through which a 5-French  single-lumen power injectable PICC trimmed to 46cm was advanced, positioned with its tip near the cavoatrial junction. Spot chest radiograph confirms appropriate catheter position. Catheter was flushed per protocol and secured externally. The patient tolerated procedure well. COMPLICATIONS: COMPLICATIONS none IMPRESSION: 1. Technically successful five [place] single lumen power injectable PICC placement Electronically Signed   By: Lucrezia Europe M.D.   On: 12/27/2018 16:15    Lab Data:  CBC: Recent Labs  Lab 12/28/18 1750  12/30/18 0429 12/31/18 0327 01/01/19 0440 01/02/19 0521 01/03/19 0616  WBC 5.7   < > 2.3* 2.4* 2.3* 3.5* 4.2  NEUTROABS 5.3  --   --  1.9  --   --   --   HGB 9.3*   < > 8.0* 8.5* 8.8* 9.8* 10.1*  HCT 27.7*   < > 25.3* 26.8* 26.9* 30.4* 31.9*  MCV 85.0   < > 86.9 86.7 86.2 87.9 88.6  PLT 97*   < > 100* 124* 128* 131* 129*   < > = values in this interval not displayed.   Basic Metabolic Panel: Recent Labs  Lab 12/28/18 1750  12/28/18 2334  12/29/18 0250 12/30/18 0429 12/31/18 0327 01/01/19 0440 01/02/19 0521 01/03/19 0616  NA 132*  --   --  133* 135 135 133*  --   --   K 3.1*  --   --  3.2* 3.3* 3.6 3.7  --   --   CL 99  --   --  104 106 106 102  --   --   CO2 24  --   --  23 22 23 24   --   --   GLUCOSE 120*  --   --  134* 124* 110* 114*  --   --   BUN 9  --   --  9 7* 5* <5*  --   --   CREATININE 0.88  --   --  0.78 0.61 0.63 0.61 0.55* 0.59*  CALCIUM 7.8*  --   --  7.5* 7.5* 7.8* 7.8*  --   --   MG  --    < > 1.5*  --  1.9 1.8 2.1 1.8 1.9  PHOS  --   --  2.8  --   --   --   --   --   --    < > = values in this interval not displayed.   GFR: Estimated Creatinine Clearance: 121.5 mL/min (A) (by C-G formula based on SCr of 0.59 mg/dL (L)). Liver Function Tests: Recent Labs  Lab 12/28/18 1750 12/30/18 0429 12/31/18 0327 01/01/19 0440  AST 35 45* 37 40  ALT 51* 48* 46* 43  ALKPHOS 525* 477* 522* 522*  BILITOT 0.7 0.4 0.5 0.6  PROT 5.0* 4.5* 4.8* 4.8*  ALBUMIN 2.4* 2.1* 2.3* 2.3*   No results for input(s): LIPASE, AMYLASE in the last 168 hours. No results for input(s): AMMONIA in the last 168 hours. Coagulation Profile: Recent Labs  Lab 12/28/18 1750 12/28/18 2334  INR 1.2 1.2   Cardiac Enzymes: No results for input(s): CKTOTAL, CKMB, CKMBINDEX, TROPONINI in the last 168 hours. BNP (last 3 results) No results for input(s): PROBNP in the last 8760 hours. HbA1C: No results for input(s): HGBA1C in the last 72 hours. CBG: No results for input(s): GLUCAP in the last 168 hours. Lipid Profile: No results for input(s): CHOL, HDL, LDLCALC, TRIG, CHOLHDL, LDLDIRECT in the last 72 hours. Thyroid Function Tests: No results for input(s): TSH, T4TOTAL, FREET4, T3FREE, THYROIDAB  in the last 72 hours. Anemia Panel: No results for input(s): VITAMINB12, FOLATE, FERRITIN, TIBC, IRON, RETICCTPCT in the last 72 hours. Urine analysis:    Component Value Date/Time   COLORURINE YELLOW 12/28/2018 1750   APPEARANCEUR  CLEAR 12/28/2018 1750   LABSPEC 1.016 12/28/2018 1750   PHURINE 5.0 12/28/2018 1750   GLUCOSEU 50 (A) 12/28/2018 1750   HGBUR SMALL (A) 12/28/2018 1750   BILIRUBINUR NEGATIVE 12/28/2018 1750   KETONESUR NEGATIVE 12/28/2018 1750   PROTEINUR 30 (A) 12/28/2018 1750   UROBILINOGEN 0.2 02/10/2007 1109   NITRITE NEGATIVE 12/28/2018 1750   LEUKOCYTESUR NEGATIVE 12/28/2018 1750     Iverna Hammac M.D. [facility] 01/03/2019, 2:27 PM  Pager: [phone] Between 7am to 7pm - call Pager - [phone]  After 7pm go to [email or web address] - password [email or web address]  Call night coverage person covering after 7pm

## 2019-01-03 NOTE — TOC Progression Note (Signed)
Transition of Care [facility]) - Progression Note    Patient Details  Name: Auden Tatar MRN: [id number] Date of Birth: 26-Apr-1956  [facility] CM/SW Contact  Arias Weinert, Juliann Pulse, RN Phone Number: 01/03/2019, 12:06 PM  Clinical Narrative:[facility]-rep Santiago Glad, & [employer] rep Pam following if HHRN-iv abx needed.       Expected Discharge Plan: Home/Self Care Barriers to Discharge: Continued Medical Work up  Expected Discharge Plan and Services Expected Discharge Plan: Home/Self Care In-house Referral: [facility](Patient will f/u with [facility] once @ home if he considers.) Discharge Planning Services: CM Consult   Living arrangements for the past 2 months: Single Family Home Expected Discharge Date: 01/01/19                                     Social Determinants of Health (SDOH) Interventions    Readmission Risk Interventions Readmission Risk Prevention Plan 12/31/2018  Transportation Screening Complete  Medication Review Press photographer) Complete  PCP or Specialist appointment within 3-5 days of discharge Complete  HRI or Home Care Consult Patient refused  SW Recovery Care/Counseling Consult Patient refused  [place] Not Applicable  Some recent data might be hidden

## 2019-01-03 NOTE — Plan of Care (Signed)

## 2019-01-04 ENCOUNTER — Inpatient Hospital Stay: Payer: Self-pay

## 2019-01-04 LAB — CREATININE, SERUM
Creatinine, Ser: 0.61 mg/dL (ref 0.61–1.24)
GFR calc Af Amer: >60 mL/min (ref >60)
GFR calc non Af Amer: >60 mL/min (ref >60)

## 2019-01-04 LAB — CANCER ANTIGEN 19-9: CA 19-9: 1052 U/mL — ABNORMAL HIGH (ref 0–35)

## 2019-01-04 MED ORDER — ERTAPENEM IV (FOR PTA / DISCHARGE USE ONLY): 1.0000 g | INTRAVENOUS | 0 refills | Status: DC

## 2019-01-04 MED ORDER — FERROUS SULFATE 325 (65 FE) MG PO TABS: 325.0000 mg | ORAL_TABLET | Freq: Two times a day (BID) | ORAL | 3 refills | Status: AC

## 2019-01-04 MED ORDER — SODIUM CHLORIDE 0.9% FLUSH: 10.0000 mL | INTRAVENOUS | Status: DC | PRN

## 2019-01-04 MED ORDER — SODIUM CHLORIDE 0.9% FLUSH
10.0000 mL | Freq: Two times a day (BID) | INTRAVENOUS | Status: DC
  Administered 2019-01-04: 10 mL

## 2019-01-04 MED ORDER — GUAIFENESIN-DM 100-10 MG/5ML PO SYRP: 5.0000 mL | ORAL_SOLUTION | ORAL | 0 refills | Status: AC | PRN

## 2019-01-04 MED ORDER — PROCHLORPERAZINE MALEATE 10 MG PO TABS: 10.0000 mg | ORAL_TABLET | Freq: Four times a day (QID) | ORAL | 0 refills | Status: AC | PRN

## 2019-01-04 MED ORDER — HYDROCORTISONE (PERIANAL) 2.5 % EX CREA: 1.0000 "application " | TOPICAL_CREAM | Freq: Four times a day (QID) | CUTANEOUS | 3 refills | Status: AC | PRN

## 2019-01-04 MED ORDER — CASPOFUNGIN IV (FOR PTA / DISCHARGE USE ONLY): 50.0000 mg | INTRAVENOUS | 0 refills | Status: DC

## 2019-01-04 NOTE — Progress Notes (Signed)
Peripherally Inserted Central Catheter/Midline Placement  The IV Nurse has discussed with the patient and/or persons authorized to consent for the patient, the purpose of this procedure and the potential benefits and risks involved with this procedure.  The benefits include less needle sticks, lab draws from the catheter, and the patient may be discharged home with the catheter. Risks include, but not limited to, infection, bleeding, blood clot (thrombus formation), and puncture of an artery; nerve damage and irregular heartbeat and possibility to perform a PICC exchange if needed/ordered by physician.  Alternatives to this procedure were also discussed.  Bard Power PICC patient education guide, fact sheet on infection prevention and patient information card has been provided to patient /or left at bedside.    PICC/Midline Placement Documentation  PICC Single Lumen 01/04/19 PICC Right Brachial 41 cm 0 cm (Active)  Indication for Insertion or Continuance of Line Home intravenous therapies (PICC only) 01/04/19 1154  Exposed Catheter (cm) 0 cm 01/04/19 1154  Site Assessment Clean;Dry;Intact 01/04/19 1154  Line Status Flushed;Saline locked;Blood return noted 01/04/19 1154  Dressing Type Transparent 01/04/19 1154  Dressing Status Clean;Dry;Antimicrobial disc in place;Intact 01/04/19 1154  Dressing Change Due 01/11/19 01/04/19 1154       Gordan Payment 01/04/2019, 11:56 AM

## 2019-01-04 NOTE — Discharge Summary (Signed)
Physician Discharge Summary   Patient ID: Cory Kelly MRN: [id number] DOB/AGE: 01/27/1956 63 y.o.  Admit date: 12/28/2018 Discharge date: 01/04/2019  Primary Care Physician:  Mancel Bale, PA-C   Recommendations for Outpatient Follow-up:  1. Follow up with PCP in 1-2 weeks 2. Per infectious disease, continue capsofungin 50 mg IV daily till 02/09/2019  3. Continue ertapenem IV till 01/26/2019. 4. follow antifungal susceptibilities of his Candida glabrata isolate   Home Health: Home health arranged for IV antibiotics Equipment/Devices:   Discharge Condition: stable CODE STATUS: FULL  Diet recommendation: Regular diet   Discharge Diagnoses:    . Sepsis present on admission,  ([place]) . Streptococcal bacteremia . Candidemia ([place]) . Essential hypertension . Hyperlipemia . Pancreatic cancer with metastasis ([place]) . Normocytic anemia, iron deficiency . Peripheral neuropathy . Nonobstructive bilateral kidney stones   Consults:   Infectious disease, Dr Prince Rome  Oncology, Dr. Learta Codding    Allergies:  No Known Allergies   DISCHARGE MEDICATIONS: Allergies as of 01/04/2019   No Known Allergies     Medication List    STOP taking these medications   cefTRIAXone  IVPB Commonly known as: ROCEPHIN     TAKE these medications   amLODipine 10 MG tablet Commonly known as: NORVASC Take 10 mg by mouth at bedtime.   caspofungin  IVPB Commonly known as: CANCIDAS Inject 50 mg into the vein daily. Indication: polymicrobial sepsis, including C. glabrata Last Day of Therapy: 02/09/2019 Labs weekly while on IV antibiotics: _X_ CBC with differential _X_ CMP Start taking on: January 05, 2019   doxazosin 1 MG tablet Commonly known as: CARDURA Take 1 mg by mouth at bedtime.   ertapenem  IVPB Commonly known as: INVANZ Inject 1 g into the vein daily for 21 days. Indication: polymicrobial sepsis with Grp C strep, S. anginosis, bacteroides.  Last Day of Therapy:  01/26/2019 Labs weekly while  on IV antibiotics: _X_ CBC with differential _X_ CMP Start taking on: January 05, 2019   ferrous sulfate 325 (65 FE) MG tablet Take 1 tablet (325 mg total) by mouth 2 (two) times daily with a meal.   gabapentin 100 MG capsule Commonly known as: Neurontin Take 2 capsules (200 mg total) by mouth 2 (two) times daily.   guaiFENesin-dextromethorphan 100-10 MG/5ML syrup Commonly known as: ROBITUSSIN DM Take 5 mLs by mouth every 4 (four) hours as needed for cough (chest congestion).   hydrocortisone 2.5 % rectal cream Commonly known as: ANUSOL-HC Apply 1 application topically 4 (four) times daily as needed for hemorrhoids.   lidocaine-prilocaine cream Commonly known as: EMLA Apply 1 application topically as needed. Apply to portacath site 1-2 hours prior to use What changed:   when to take this  reasons to take this  additional instructions   loperamide 2 MG tablet Commonly known as: Imodium A-D Take 1 tablet (2 mg total) by mouth as directed. What changed:   how much to take  when to take this  reasons to take this   olmesartan 40 MG tablet Commonly known as: Benicar Take 1 tablet (40 mg total) by mouth daily.   oxyCODONE-acetaminophen 10-325 MG tablet Commonly known as: Percocet Take 1 tablet by mouth every 4 (four) hours as needed for pain.   potassium chloride SA 20 MEQ tablet Commonly known as: K-DUR Take 1 tablet (20 mEq total) by mouth 2 (two) times daily.   PRAMOXINE-ZINC ACETATE EX Apply 1 application topically daily as needed (itching).   prochlorperazine 10 MG tablet Commonly known as:  COMPAZINE Take 1 tablet (10 mg total) by mouth every 6 (six) hours as needed for nausea or vomiting.   tamsulosin 0.4 MG Caps capsule Commonly known as: FLOMAX Take 1 capsule (0.4 mg total) by mouth daily after supper.            Home Infusion Instuctions  (From admission, onward)         Start     Ordered   01/04/19 0000  Home infusion instructions [facility] May follow [place] Dosing Protocol; May administer Cathflo as needed to maintain patency of vascular access device.; Flushing of vascular access device: per [facility] Protocol: 0.9% NaCl pre/post medica...    Question Answer Comment  Instructions May follow [place] Dosing Protocol   Instructions May administer Cathflo as needed to maintain patency of vascular access device.   Instructions Flushing of vascular access device: per [facility] Protocol: 0.9% NaCl pre/post medication administration and prn patency; Heparin 100 u/ml, 14ml for implanted ports and Heparin 10u/ml, 47ml for all other central venous catheters.   Instructions May follow [facility] Anaphylaxis Protocol for First Dose Administration in the home: 0.9% NaCl at 25-50 ml/hr to maintain IV access for protocol meds. Epinephrine 0.3 ml IV/IM PRN and Benadryl 25-50 IV/IM PRN s/s of anaphylaxis.   Instructions [facility] Infusion Coordinator (RN) to assist per patient IV care needs in the home PRN.      01/04/19 0826           Brief H and P: For complete details please refer to admission H and P, but in brief 63 year old with history of metastatic pancreatic cancer status post biliary stent, essential hypertension, hyperlipidemia returned back to the hospital with fevers and chills. He was recently discharged here from the hospital after being treated for gram-negative and Streptococcus bacteremia with IV Rocephin.Upon readmission was started on broad-spectrum antibiotics. ID recommended ertapenem but his cultures also started growing Candida therefore started on antifungal. S/p Paracentesis on 6/29.  Hospital Course:  Sepsis, POA, Streptococcus bacteremia,, Candida fungemia - Blood cultures 6/29 showed Candida glabrata - Patient has recurrent admissions for the past 2 months with polymicrobial bacteremia.  -Status post paracentesis 6/29 cultures negative so far.  No abdominal tenderness.  C. difficile negative -2D echo  showed EF of 55 to 60%, limited echo to evaluate for possible endocarditis, no evidence of valvular vegetation. -Seen by ophthalmology, Dr Katy Fitch, no acute issues currently, patient was recommended outpatient follow-up if any visual changes. -Patient was followed closely by oncology and infectious disease.  Per ID, Dr. Prince Rome, placed on capsofungin and ertapenem, home health arranged for IV antibiotics.  PICC line will be placed today.  Concern remains if biliary stent is the nidus of his infection and if it can be exchanged. -Per oncology, chemo will be placed on hold until patient has finished antibiotics.   Normocytic anemia, iron deficiency -Likely from anemia of chronic disease, metastatic pancreatic CA  History of pancreatic CA with metastasis -Oncology following, Dr. Learta Codding, chemotherapy will remain on hold due to sepsis -Port-A-Cath was removed previous admission, will need new IV access prior to starting chemotherapy  Constipation -Continue bowel regimen  Nonobstructive bilateral kidney stones -Currently no pain, renal function normal  Peripheral neuropathy -Continue gabapentin  Essential hypertension BP currently stable   Day of Discharge S: Patient excited to go home, PICC line will be placed today.  BP 113/70 (BP Location: Left Arm)   Pulse 64   Temp 98.2 F (36.8 C) (Oral)  Resp 16   Ht 6\' 5"  (1.956 m)   Wt 88.6 kg   SpO2 97%   BMI 23.16 kg/m   Physical Exam: General: Alert and awake oriented x3 not in any acute distress. HEENT: anicteric sclera, pupils reactive to light and accommodation CVS: S1-S2 clear no murmur rubs or gallops Chest: clear to auscultation bilaterally, no wheezing rales or rhonchi Abdomen: soft nontender, nondistended, normal bowel sounds Extremities: no cyanosis, clubbing or edema noted bilaterally Neuro: Cranial nerves II-XII intact, no focal neurological deficits   The results of significant diagnostics from this  hospitalization (including imaging, microbiology, ancillary and laboratory) are listed below for reference.      Procedures/Studies:  Dg Chest 2 View  Result Date: 12/28/2018 CLINICAL DATA:  Sepsis EXAM: CHEST - 2 VIEW COMPARISON:  December 24, 2018 FINDINGS: The right-sided Port-A-Cath has been removed. There is a new right-sided PICC line with tip terminating over the distal SVC. The cardiac size is stable. There is no pneumothorax. No large pleural effusion. The lung volumes are slightly low. IMPRESSION: No active cardiopulmonary disease. Electronically Signed   By: Constance Holster M.D.   On: 12/28/2018 18:53   Ct Abdomen Pelvis W Contrast  Result Date: 12/24/2018 CLINICAL DATA:  Abdominal distension, fever, history of pancreatic cancer EXAM: CT ABDOMEN AND PELVIS WITH CONTRAST TECHNIQUE: Multidetector CT imaging of the abdomen and pelvis was performed using the standard protocol following bolus administration of intravenous contrast. CONTRAST:  162mL OMNIPAQUE IOHEXOL 300 MG/ML  SOLN COMPARISON:  MR abdomen pelvis, 12/04/2018, 10/30/2018 FINDINGS: Lower chest: No acute abnormality. Hepatobiliary: Known liver metastases are poorly appreciated by single-phase CT, for example in the dome of the liver (series 2, image 14). No gallstones or gallbladder wall thickening. Common bile duct stent is positioned tip in the descending portion of the duodenum with post stenting pneumobilia. Pancreas: Discrete pancreatic head mass is not well appreciated, primarily appreciated by mass effect on the confluence of the portal vein (series 2, image 38. There is atrophy of the pancreatic body and tail and dilatation pancreatic duct. Spleen: Splenomegaly. Adrenals/Urinary Tract: Adrenal glands are unremarkable. Nonobstructive bilateral nephrolithiasis. Bladder is unremarkable. Stomach/Bowel: Stomach is within normal limits. Appendix is not clearly visualized. No evidence of bowel wall thickening, distention, or  inflammatory changes. Large burden of stool in the distal colon and rectum. Vascular/Lymphatic: Aortic atherosclerosis. No enlarged abdominal or pelvic lymph nodes. Reproductive: No mass or other significant abnormality. Other: No abdominal wall hernia or abnormality. Moderate volume ascites. Musculoskeletal: No acute or significant osseous findings. IMPRESSION: 1. Redemonstrated pancreatic malignancy with hepatic metastases, status post common bile duct stenting. Primary mass and hepatic metastases are better appreciated by recent multiphasic MRI. 2.  Moderate volume ascites, which is similar to prior MRI. 3.  Splenomegaly. 4.  Large burden of stool in the distal colon and rectum. 5.  Nonobstructive bilateral nephrolithiasis. Electronically Signed   By: Eddie Candle M.D.   On: 12/24/2018 14:35   US Paracentesis  Result Date: 12/31/2018 INDICATION: Patient with history of metastatic pancreatic cancer, ascites. Request made for diagnostic and therapeutic paracentesis. EXAM: ULTRASOUND GUIDED DIAGNOSTIC AND THERAPEUTIC PARACENTESIS MEDICATIONS: None COMPLICATIONS: None immediate. PROCEDURE: Informed written consent was obtained from the patient after a discussion of the risks, benefits and alternatives to treatment. A timeout was performed prior to the initiation of the procedure. Initial ultrasound scanning demonstrates a small to moderate amount of ascites within the right upper to mid abdominal quadrant. The right upper to mid abdomen was prepped and  draped in the usual sterile fashion. 1% lidocaine was used for local anesthesia. Following this, a 19 gauge, 7-cm, Yueh catheter was introduced. An ultrasound image was saved for documentation purposes. The paracentesis was performed. The catheter was removed and a dressing was applied. The patient tolerated the procedure well without immediate post procedural complication. FINDINGS: A total of approximately 2.6 liters of slightly hazy, light yellow fluid was  removed. Samples were sent to the laboratory as requested by the clinical team. IMPRESSION: Successful ultrasound-guided diagnostic and therapeutic paracentesis yielding 2.6 liters of peritoneal fluid. Read by: Rowe Robert, PA-C Electronically Signed   By: Sandi Mariscal M.D.   On: 12/31/2018 12:04   Ir Removal [employer] W/ [place] W/o Fl Mod Sed  Result Date: 12/27/2018 CLINICAL DATA:  Bacteremia. Indwelling right IJ port catheter placed 08/30/2017 by Dr. Pascal Lux, has been working well without evident complication , removal requested EXAM: EXAM TUNNELED PORT CATHETER REMOVAL TECHNIQUE: The procedure, risks (including but not limited to bleeding, infection, organ damage ), benefits, and alternatives were explained to the patient. Questions regarding the procedure were encouraged and answered. The patient understands and consents to the procedure. Intravenous Fentanyl 168mcg and Versed 4mg  were administered as conscious sedation during continuous monitoring of the patient's level of consciousness and physiological / cardiorespiratory status by the radiology RN, with a total moderate sedation time of 37 minutes. Overlying skin prepped with chlorhexidine, draped in usual sterile fashion, infiltrated locally with 1% lidocaine. A small incision was made over the scar from previous placement. The port catheter was dissected free from the underlying soft tissues and removed intact. There is no gross purulence. Hemostasis was achieved. The port pocket was closed with deep interrupted and subcuticular continuous 3-0 Monocryl sutures, then covered with Dermabond. The patient tolerated the procedure well. COMPLICATIONS: COMPLICATIONS None immediate IMPRESSION: 1.  Technically successful tunneled Port catheter removal. Electronically Signed   By: Lucrezia Europe M.D.   On: 12/27/2018 16:14   Dg Chest Port 1 View  Result Date: 12/28/2018 CLINICAL DATA:  Sepsis hypertension EXAM: PORTABLE CHEST 1 VIEW COMPARISON:  12/28/2018,  12/24/2018 FINDINGS: Right upper extremity catheter tip overlies the SVC. No acute consolidation or effusion. Normal cardiomediastinal silhouette. No pneumothorax. IMPRESSION: No active disease. Electronically Signed   By: Donavan Foil M.D.   On: 12/28/2018 23:03   Dg Chest Portable 1 View  Result Date: 12/24/2018 CLINICAL DATA:  Fever and weakness. EXAM: PORTABLE CHEST 1 VIEW COMPARISON:  11/04/2018 FINDINGS: The cardiac silhouette, mediastinal and hilar contours are stable. Mild cardiac enlargement and mild tortuosity of the thoracic aorta. The right IJ power port is stable. The lungs are clear. No pleural effusions. The bony thorax is intact. IMPRESSION: No acute cardiopulmonary findings. Electronically Signed   By: Marijo Sanes M.D.   On: 12/24/2018 13:59   Dg Ercp  Result Date: 12/06/2018 CLINICAL DATA:  63 year old male with a history of pancreatic cancer and obstructed jaundice with prior stent placement and progressive liver enzyme elevation. EXAM: ERCP TECHNIQUE: Multiple spot images obtained with the fluoroscopic device and submitted for interpretation post-procedure. FLUOROSCOPY TIME:  Fluoroscopy Time:  3 minutes 43 seconds Radiation Exposure Index (if provided by the fluoroscopic device): 66.7 mGy COMPARISON:  MRCP 12/04/2018 FINDINGS: A total of 4 intraoperative saved images are submitted for review. The images demonstrate a metal stent in the common hepatic and common bile ducts with a flexible endoscope in the descending duodenum. Subsequent images demonstrate wire cannulation of the stent and balloon dilation. IMPRESSION:  ERCP with cannulation of the previously placed metal stent and balloon dilatation. These images were submitted for radiologic interpretation only. Please see the procedural report for the amount of contrast and the fluoroscopy time utilized. Electronically Signed   By: Jacqulynn Cadet M.D.   On: 12/06/2018 11:51   [place] Ascites (abdomen Limited)  Result Date:  12/25/2018 CLINICAL DATA:  63 year old male with abdominal distension, ascites EXAM: LIMITED ABDOMEN ULTRASOUND FOR ASCITES TECHNIQUE: Limited ultrasound survey for ascites was performed in all four abdominal quadrants. COMPARISON:  Prior CT scan of the abdomen and pelvis 12/24/2018 FINDINGS: Sonographic interrogation was performed of the 4 quadrants of the abdomen. There is trace perihepatic fluid in the right upper quadrant with multiple small loops of bowel. Overall, fluid volume is low. No safe window for paracentesis. IMPRESSION: Small volume ascites, insufficient for paracentesis. Electronically Signed   By: Jacqulynn Cadet M.D.   On: 12/25/2018 12:13   Ir Picc Placement Right >5 Yrs Inc Img Guide  Result Date: 12/27/2018 CLINICAL DATA:  Bacteremia, needs venous access for planned chemotherapy Monday EXAM: PICC PLACEMENT WITH ULTRASOUND AND FLUOROSCOPY FLUOROSCOPY TIME:  6 seconds, 1 mGy TECHNIQUE: After written informed consent was obtained, patient was placed in the supine position on angiographic table. Patency of the right basilic vein was confirmed with ultrasound with image documentation. An appropriate skin site was determined. Skin site was marked. Region was prepped using maximum barrier technique including cap and mask, sterile gown, sterile gloves, large sterile sheet, and Chlorhexidine as cutaneous antisepsis. The region was infiltrated locally with 1% lidocaine. Under real-time ultrasound guidance, the right basilic vein was accessed with a 21 gauge micropuncture needle; the needle tip within the vein was confirmed with ultrasound image documentation. Needle exchanged over a 018 guidewire for a peel-away sheath, through which a 5-French single-lumen power injectable PICC trimmed to 46cm was advanced, positioned with its tip near the cavoatrial junction. Spot chest radiograph confirms appropriate catheter position. Catheter was flushed per protocol and secured externally. The patient  tolerated procedure well. COMPLICATIONS: COMPLICATIONS none IMPRESSION: 1. Technically successful five [place] single lumen power injectable PICC placement Electronically Signed   By: Lucrezia Europe M.D.   On: 12/27/2018 16:15   [place] Ekg Site Rite  Result Date: 01/04/2019 If Site Rite image not attached, placement could not be confirmed due to current cardiac rhythm.     LAB RESULTS: Basic Metabolic Panel: Recent Labs  Lab 12/28/18 2334  12/31/18 0327 01/01/19 0440  01/03/19 0616 01/04/19 0505  NA  --    < > 135 133*  --   --   --   K  --    < > 3.6 3.7  --   --   --   CL  --    < > 106 102  --   --   --   CO2  --    < > 23 24  --   --   --   GLUCOSE  --    < > 110* 114*  --   --   --   BUN  --    < > 5* <5*  --   --   --   CREATININE  --    < > 0.63 0.61   < > 0.59* 0.61  CALCIUM  --    < > 7.8* 7.8*  --   --   --   MG 1.5*   < > 1.8 2.1   < > 1.9  --  PHOS 2.8  --   --   --   --   --   --    < > = values in this interval not displayed.   Liver Function Tests: Recent Labs  Lab 12/31/18 0327 01/01/19 0440  AST 37 40  ALT 46* 43  ALKPHOS 522* 522*  BILITOT 0.5 0.6  PROT 4.8* 4.8*  ALBUMIN 2.3* 2.3*   No results for input(s): LIPASE, AMYLASE in the last 168 hours. No results for input(s): AMMONIA in the last 168 hours. CBC: Recent Labs  Lab 12/31/18 0327  01/02/19 0521 01/03/19 0616  WBC 2.4*   < > 3.5* 4.2  NEUTROABS 1.9  --   --   --   HGB 8.5*   < > 9.8* 10.1*  HCT 26.8*   < > 30.4* 31.9*  MCV 86.7   < > 87.9 88.6  PLT 124*   < > 131* 129*   < > = values in this interval not displayed.   Cardiac Enzymes: No results for input(s): CKTOTAL, CKMB, CKMBINDEX, TROPONINI in the last 168 hours. BNP: Invalid input(s): POCBNP CBG: No results for input(s): GLUCAP in the last 168 hours.    Disposition and Follow-up: Discharge Instructions    Diet general   Complete by: As directed    Home infusion instructions [facility] May follow [place] Dosing  Protocol; May administer Cathflo as needed to maintain patency of vascular access device.; Flushing of vascular access device: per [facility] Protocol: 0.9% NaCl pre/post medica...   Complete by: As directed    Instructions: May follow [place] Dosing Protocol   Instructions: May administer Cathflo as needed to maintain patency of vascular access device.   Instructions: Flushing of vascular access device: per [facility] Protocol: 0.9% NaCl pre/post medication administration and prn patency; Heparin 100 u/ml, 53ml for implanted ports and Heparin 10u/ml, 12ml for all other central venous catheters.   Instructions: May follow [facility] Anaphylaxis Protocol for First Dose Administration in the home: 0.9% NaCl at 25-50 ml/hr to maintain IV access for protocol meds. Epinephrine 0.3 ml IV/IM PRN and Benadryl 25-50 IV/IM PRN s/s of anaphylaxis.   Instructions: [place] Infusion Coordinator (RN) to assist per patient IV care needs in the home PRN.   Increase activity slowly   Complete by: As directed        DISPOSITION: Home with home health   [place]    Powers, Evern Core, MD Follow up on 01/21/2019.   Specialty: Infectious Diseases Why: at 10:30AM Contact information: [place] [address] [phone]        Ladell Pier, MD. Schedule an appointment as soon as possible for a visit in 2 week(s).   Specialty: Oncology Contact information: [place] [address] [phone]        Debbra Riding, MD .   Specialty: Ophthalmology Contact information: [address] [place] [address] [phone]            Time coordinating discharge:  35 minutes  Signed:   Estill Cotta M.D. [facility] 01/04/2019, 11:18 AM

## 2019-01-04 NOTE — Plan of Care (Signed)

## 2019-01-05 DIAGNOSIS — C259 Malignant neoplasm of pancreas, unspecified: Secondary | ICD-10-CM | POA: Diagnosis not present

## 2019-01-05 DIAGNOSIS — R7881 Bacteremia: Secondary | ICD-10-CM | POA: Diagnosis not present

## 2019-01-05 DIAGNOSIS — A401 Sepsis due to streptococcus, group B: Secondary | ICD-10-CM | POA: Diagnosis not present

## 2019-01-05 LAB — CULTURE, BODY FLUID W GRAM STAIN -BOTTLE: Culture: NO GROWTH

## 2019-01-05 LAB — ANTIFUNGAL AST 9 DRUG PANEL
Amphotericin B MIC: 0.25
Fluconazole Islt MIC: 4
Flucytosine MIC: 0.06
Itraconazole MIC: 0.03
Posaconazole MIC: 0.25
Voriconazole MIC: 0.06

## 2019-01-05 LAB — CULTURE, BLOOD (ROUTINE X 2)
Culture: NO GROWTH
Culture: NO GROWTH
Special Requests: ADEQUATE
Special Requests: ADEQUATE

## 2019-01-06 ENCOUNTER — Emergency Department (HOSPITAL_COMMUNITY): Payer: BC Managed Care – PPO

## 2019-01-06 ENCOUNTER — Emergency Department (HOSPITAL_COMMUNITY)
Admission: EM | Admit: 2019-01-06 | Discharge: 2019-01-07 | Disposition: A | Discharge status: Home / Self Care | Payer: BC Managed Care – PPO | Attending: Emergency Medicine | Admitting: Emergency Medicine

## 2019-01-06 ENCOUNTER — Encounter (HOSPITAL_COMMUNITY): Payer: Self-pay | Admitting: Emergency Medicine

## 2019-01-06 ENCOUNTER — Other Ambulatory Visit: Payer: Self-pay

## 2019-01-06 DIAGNOSIS — C259 Malignant neoplasm of pancreas, unspecified: Secondary | ICD-10-CM | POA: Insufficient documentation

## 2019-01-06 DIAGNOSIS — R509 Fever, unspecified: Secondary | ICD-10-CM

## 2019-01-06 DIAGNOSIS — R748 Abnormal levels of other serum enzymes: Secondary | ICD-10-CM | POA: Diagnosis not present

## 2019-01-06 DIAGNOSIS — Z79899 Other long term (current) drug therapy: Secondary | ICD-10-CM | POA: Diagnosis not present

## 2019-01-06 DIAGNOSIS — E871 Hypo-osmolality and hyponatremia: Secondary | ICD-10-CM

## 2019-01-06 DIAGNOSIS — D649 Anemia, unspecified: Secondary | ICD-10-CM

## 2019-01-06 DIAGNOSIS — I10 Essential (primary) hypertension: Secondary | ICD-10-CM | POA: Insufficient documentation

## 2019-01-06 LAB — CBC WITH DIFFERENTIAL/PLATELET
Abs Immature Granulocytes: 0.02 10*3/uL (ref 0.00–0.07)
Basophils Absolute: 0 10*3/uL (ref 0.0–0.1)
Basophils Relative: 0 %
Eosinophils Absolute: 0 10*3/uL (ref 0.0–0.5)
Eosinophils Relative: 0 %
HCT: 31.3 % — ABNORMAL LOW (ref 39.0–52.0)
Hemoglobin: 10.1 g/dL — ABNORMAL LOW (ref 13.0–17.0)
Immature Granulocytes: 0 %
Lymphocytes Relative: 3 %
Lymphs Abs: 0.1 10*3/uL — ABNORMAL LOW (ref 0.7–4.0)
MCH: 28.3 pg (ref 26.0–34.0)
MCHC: 32.3 g/dL (ref 30.0–36.0)
MCV: 87.7 fL (ref 80.0–100.0)
Monocytes Absolute: 0.6 10*3/uL (ref 0.1–1.0)
Monocytes Relative: 12 %
Neutro Abs: 4 10*3/uL (ref 1.7–7.7)
Neutrophils Relative %: 85 %
Platelets: 71 10*3/uL — ABNORMAL LOW (ref 150–400)
RBC: 3.57 MIL/uL — ABNORMAL LOW (ref 4.22–5.81)
RDW: 15.5 % (ref 11.5–15.5)
WBC: 4.7 10*3/uL (ref 4.0–10.5)
nRBC: 0 % (ref 0.0–0.2)

## 2019-01-06 LAB — COMPREHENSIVE METABOLIC PANEL WITH GFR
ALT: 94 U/L — ABNORMAL HIGH (ref 0–44)
AST: 226 U/L — ABNORMAL HIGH (ref 15–41)
Albumin: 2.5 g/dL — ABNORMAL LOW (ref 3.5–5.0)
Alkaline Phosphatase: 834 U/L — ABNORMAL HIGH (ref 38–126)
Anion gap: 8 (ref 5–15)
BUN: 7 mg/dL — ABNORMAL LOW (ref 8–23)
CO2: 29 mmol/L (ref 22–32)
Calcium: 8 mg/dL — ABNORMAL LOW (ref 8.9–10.3)
Chloride: 95 mmol/L — ABNORMAL LOW (ref 98–111)
Creatinine, Ser: 0.77 mg/dL (ref 0.61–1.24)
GFR calc Af Amer: >60 mL/min
GFR calc non Af Amer: >60 mL/min
Glucose, Bld: 165 mg/dL — ABNORMAL HIGH (ref 70–99)
Potassium: 3.8 mmol/L (ref 3.5–5.1)
Sodium: 132 mmol/L — ABNORMAL LOW (ref 135–145)
Total Bilirubin: 3.3 mg/dL — ABNORMAL HIGH (ref 0.3–1.2)
Total Protein: 5.1 g/dL — ABNORMAL LOW (ref 6.5–8.1)

## 2019-01-06 LAB — URINALYSIS, ROUTINE W REFLEX MICROSCOPIC
Glucose, UA: 50 mg/dL — AB
Hgb urine dipstick: NEGATIVE
Ketones, ur: NEGATIVE mg/dL
Leukocytes,Ua: NEGATIVE
Nitrite: NEGATIVE
Protein, ur: 30 mg/dL — AB
Specific Gravity, Urine: 1.016 (ref 1.005–1.030)
pH: 5 (ref 5.0–8.0)

## 2019-01-06 LAB — LACTIC ACID, PLASMA: Lactic Acid, Venous: 0.7 mmol/L (ref 0.5–1.9)

## 2019-01-06 MED ORDER — SODIUM CHLORIDE 0.9 % IV SOLN
2.0000 g | Freq: Once | INTRAVENOUS | Status: AC
  Administered 2019-01-06: 2 g via INTRAVENOUS
  Filled 2019-01-06: qty 2

## 2019-01-06 MED ORDER — SODIUM CHLORIDE 0.9 % IV BOLUS (SEPSIS)
1000.0000 mL | Freq: Once | INTRAVENOUS | Status: AC
  Administered 2019-01-07: 1000 mL via INTRAVENOUS

## 2019-01-06 MED ORDER — SODIUM CHLORIDE 0.9 % IV BOLUS (SEPSIS)
1000.0000 mL | Freq: Once | INTRAVENOUS | Status: AC
  Administered 2019-01-06: 1000 mL via INTRAVENOUS

## 2019-01-06 NOTE — ED Triage Notes (Signed)
Patient BIB wife, reports intermittent episodes of "fever and chills with confusion" since last night. Recently admitted for sepsis. Has PICC line in place. Hx pancreatic cancer. Has not started treatments yet. Last took oxycodone at approximately 1830.

## 2019-01-06 NOTE — ED Notes (Signed)
Attempting to give urine sample

## 2019-01-06 NOTE — ED Provider Notes (Signed)
[place] [facility] DEPT Provider Note   CSN: [id number] Arrival date & time: 01/06/19  1937     History   Chief Complaint Chief Complaint  Patient presents with  . Chills  . Altered Mental Status    HPI Zoe Nordin is a 63 y.o. male.     HPI Pt has been in the hospital recently for sepsis.   He was dc'd on 7/3.  Pt has been on various abx.  Pt went to get breakfast this am when he started shivering.  Pt was concerned about recurrent of infection so he came to the hospital.  Wife states he has had a few episodes of shaking.  They called the oncology doctor who recommended he come to the hospital.  No cough, no vomiting.  Some loose stools.  No dysuria.  Temp to 102 today.  Last chemo treatment was several weeks ago.  Past Medical History:  Diagnosis Date  . Abdominal pain    due to bloating  . Bloating   . Cancer ([place])    skin cancer  . Essential hypertension   . Fatigue   . History of kidney stones    passed  . History of weight loss   . HOH (hard of hearing)    no hearing aids  . Neuralgia   . Pancreatic cancer ([place])   . Stress    loss of father in Dec 2018.    Patient Active Problem List   Diagnosis Date Noted  . Candidemia ([place]) 01/01/2019  . Streptococcal bacteremia 12/31/2018  . Sepsis ([place]) 12/24/2018  . Bacteroides infection   . Fever, unspecified 11/05/2018  . Episode of transient neurologic symptoms 11/04/2018  . Biliary stent migration, initial encounter   . Pancreatic cancer ([place]) 07/20/2018  . 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 Jaundice 07/20/2018  . History of ERCP 07/20/2018  . Mass of pancreas 07/20/2018  . Goals of care, counseling/discussion 07/04/2018  . Cholangitis 06/22/2018  . Port-A-Cath in place 08/31/2017  . 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[facility]) 08/21/2017  . Abnormal pancreas function test   . Pancreatic abnormality   . 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 Elevated LFTs 08/04/2017   . 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 Elevated serum creatinine 11/20/2012  . Elevated PSA 03/05/2012  . Essential hypertension 11/02/2011  . Hyperlipemia 11/02/2011    Past Surgical History:  Procedure Laterality Date  . APPENDECTOMY    . BILIARY STENT PLACEMENT N/A 06/25/2018   Procedure: BILIARY STENT PLACEMENT;  Surgeon: Rush Landmark Telford Nab., MD;  Location: [facility] ENDOSCOPY;  Service: Gastroenterology;  Laterality: N/A;  . BILIARY STENT PLACEMENT  07/20/2018   Procedure: BILIARY STENT PLACEMENT;  Surgeon: Milus Banister, MD;  Location: [facility] ENDOSCOPY;  Service: Gastroenterology;;  . BILIARY STENT PLACEMENT N/A 08/16/2018   Procedure: BILIARY STENT PLACEMENT;  Surgeon: Milus Banister, MD;  Location: [facility] ENDOSCOPY;  Service: Endoscopy;  Laterality: N/A;  . ENDOSCOPIC RETROGRADE CHOLANGIOPANCREATOGRAPHY (ERCP) WITH PROPOFOL N/A 08/10/2017   Procedure: ENDOSCOPIC RETROGRADE CHOLANGIOPANCREATOGRAPHY (ERCP) WITH PROPOFOL;  Surgeon: Milus Banister, MD;  Location: [facility] ENDOSCOPY;  Service: Endoscopy;  Laterality: N/A;  . ENDOSCOPIC RETROGRADE CHOLANGIOPANCREATOGRAPHY (ERCP) WITH PROPOFOL N/A 07/20/2018   Procedure: ENDOSCOPIC RETROGRADE CHOLANGIOPANCREATOGRAPHY (ERCP) WITH PROPOFOL;  Surgeon: Milus Banister, MD;  Location: [facility] ENDOSCOPY;  Service: Gastroenterology;  Laterality: N/A;  Balloon Sweeps  . ENDOSCOPIC RETROGRADE CHOLANGIOPANCREATOGRAPHY (ERCP) WITH PROPOFOL N/A 08/16/2018   Procedure: ENDOSCOPIC RETROGRADE CHOLANGIOPANCREATOGRAPHY (ERCP) WITH PROPOFOL;  Surgeon: Milus Banister,  MD;  Location: [facility] ENDOSCOPY;  Service: Endoscopy;  Laterality: N/A;  . ENDOSCOPIC RETROGRADE CHOLANGIOPANCREATOGRAPHY (ERCP) WITH PROPOFOL N/A 12/06/2018   Procedure: ENDOSCOPIC RETROGRADE CHOLANGIOPANCREATOGRAPHY (ERCP) WITH PROPOFOL;  Surgeon: Milus Banister, MD;  Location: [facility] ENDOSCOPY;  Service: Endoscopy;  Laterality: N/A;  cbd stent sweep  . ERCP N/A 06/25/2018   Procedure: ENDOSCOPIC RETROGRADE  CHOLANGIOPANCREATOGRAPHY (ERCP);  Surgeon: Irving Copas., MD;  Location: [facility] ENDOS[facility];  Service: Gastroenterology;  Laterality: N/A;  . EUS N/A 08/10/2017   Procedure: UPPER ENDOSCOPIC ULTRASOUND (EUS) RADIAL;  Surgeon: Milus Banister, MD;  Location: [facility] ENDOSCOPY;  Service: Endoscopy;  Laterality: N/A;  . EYE SURGERY     lasik/left eye  . IR FLUORO GUIDE PORT INSERTION RIGHT  08/30/2017  . IR REMOVAL TUN ACCESS W/ PORT W/O FL MOD SED  12/27/2018  . IR US GUIDE VASC ACCESS RIGHT  08/30/2017  . REMOVAL OF STONES  06/25/2018   Procedure: REMOVAL OF STONES;  Surgeon: Rush Landmark Telford Nab., MD;  Location: [facility] ENDOSCOPY;  Service: Gastroenterology;;  . SKIN CANCER EXCISION    . STENT REMOVAL  07/20/2018   Procedure: STENT REMOVAL;  Surgeon: Milus Banister, MD;  Location: [facility] ENDOSCOPY;  Service: Gastroenterology;;  . Lavell Islam REMOVAL  08/16/2018   Procedure: STENT REMOVAL;  Surgeon: Milus Banister, MD;  Location: [facility] ENDOSCOPY;  Service: Endoscopy;;  . TEE WITHOUT CARDIOVERSION N/A 11/08/2018   Procedure: TRANSESOPHAGEAL ECHOCARDIOGRAM (TEE);  Surgeon: Acie Fredrickson Wonda Cheng, MD;  Location: [facility] ENDOSCOPY;  Service: Cardiovascular;  Laterality: N/A;        Home Medications    Prior to Admission medications   Medication Sig Start Date End Date Taking? Authorizing Provider  amLODipine (NORVASC) 10 MG tablet Take 10 mg by mouth at bedtime.  05/21/18   [provider]  caspofungin (CANCIDAS) IVPB Inject 50 mg into the vein daily. Indication: polymicrobial sepsis, including C. glabrata Last Day of Therapy: 02/09/2019 Labs weekly while on IV antibiotics: _X_ CBC with differential _X_ CMP 01/05/19 02/09/19  Rai, Ripudeep K, MD  doxazosin (CARDURA) 1 MG tablet Take 1 mg by mouth at bedtime.    [provider]  ertapenem (INVANZ) IVPB Inject 1 g into the vein daily for 21 days. Indication: polymicrobial sepsis with Grp C strep, S. anginosis, bacteroides.  Last Day of Therapy:  01/26/2019  Labs weekly while on IV antibiotics: _X_ CBC with differential _X_ CMP 01/05/19 01/26/19  Rai, Ripudeep K, MD  ferrous sulfate 325 (65 FE) MG tablet Take 1 tablet (325 mg total) by mouth 2 (two) times daily with a meal. 01/04/19   Rai, Ripudeep K, MD  gabapentin (NEURONTIN) 100 MG capsule Take 2 capsules (200 mg total) by mouth 2 (two) times daily. 12/03/18   Owens Shark, NP  guaiFENesin-dextromethorphan (ROBITUSSIN DM) 100-10 MG/5ML syrup Take 5 mLs by mouth every 4 (four) hours as needed for cough (chest congestion). 01/04/19   Rai, Vernelle Emerald, MD  hydrocortisone (ANUSOL-HC) 2.5 % rectal cream Apply 1 application topically 4 (four) times daily as needed for hemorrhoids. 01/04/19   Rai, Vernelle Emerald, MD  lidocaine-prilocaine (EMLA) cream Apply 1 application topically as needed. Apply to portacath site 1-2 hours prior to use Patient taking differently: Apply 1 application topically daily as needed (1-2 hours prior to portacath access).  08/20/18   Owens Shark, NP  loperamide (IMODIUM A-D) 2 MG tablet Take 1 tablet (2 mg total) by mouth as directed. Patient taking differently: Take 2-4 mg by mouth 3 (three)  times daily as needed for diarrhea or loose stools.  08/22/18   Ladell Pier, MD  olmesartan (BENICAR) 40 MG tablet Take 1 tablet (40 mg total) by mouth daily. 04/04/18   Weber, Damaris Hippo, PA-C  oxyCODONE-acetaminophen (PERCOCET) 10-325 MG tablet Take 1 tablet by mouth every 4 (four) hours as needed for pain. 12/28/18   Owens Shark, NP  potassium chloride SA (K-DUR) 20 MEQ tablet Take 1 tablet (20 mEq total) by mouth 2 (two) times daily. 12/28/18   Ladell Pier, MD  [facility] ACETATE EX Apply 1 application topically daily as needed (itching).    [provider]  prochlorperazine (COMPAZINE) 10 MG tablet Take 1 tablet (10 mg total) by mouth every 6 (six) hours as needed for nausea or vomiting. 01/04/19   Rai, Vernelle Emerald, MD  tamsulosin (FLOMAX) 0.4 MG CAPS capsule Take 1 capsule (0.4 mg  total) by mouth daily after supper. 12/27/18   Damita Lack, MD    Family History Family History  Problem Relation Age of Onset  . Hyperlipidemia Father   . Heart disease Father   . Dementia Father   . Memory loss Mother   . Colon cancer Neg Hx   . Esophageal cancer Neg Hx   . Inflammatory bowel disease Neg Hx   . Liver disease Neg Hx   . Rectal cancer Neg Hx   . Stomach cancer Neg Hx   . Pancreatic cancer Neg Hx     Social History Social History   Tobacco Use  . Smoking status: Never Smoker  . Smokeless tobacco: Never Used  Substance Use Topics  . Alcohol use: No  . Drug use: No     Allergies   Patient has no known allergies.   Review of Systems Review of Systems  All other systems reviewed and are negative.    Physical Exam Updated Vital Signs BP 102/63 (BP Location: Left Arm)   Pulse (!) 102   Temp 98.5 F (36.9 C) (Oral)   Resp 20   SpO2 90%   Physical Exam Vitals signs and nursing note reviewed.  Constitutional:      General: He is not in acute distress.    Appearance: He is well-developed.  HENT:     Head: Normocephalic and atraumatic.     Right Ear: External ear normal.     Left Ear: External ear normal.  Eyes:     General: No scleral icterus.       Right eye: No discharge.        Left eye: No discharge.     Conjunctiva/sclera: Conjunctivae normal.  Neck:     Musculoskeletal: Neck supple.     Trachea: No tracheal deviation.  Cardiovascular:     Rate and Rhythm: Normal rate and regular rhythm.  Pulmonary:     Effort: Pulmonary effort is normal. No respiratory distress.     Breath sounds: Normal breath sounds. No stridor. No wheezing or rales.  Abdominal:     General: Bowel sounds are normal. There is no distension.     Palpations: Abdomen is soft.     Tenderness: There is no abdominal tenderness. There is no guarding or rebound.  Musculoskeletal:        General: No tenderness.  Skin:    General: Skin is warm and dry.      Findings: No rash.  Neurological:     Mental Status: He is alert.     Cranial Nerves: No cranial nerve deficit (  no facial droop, extraocular movements intact, no slurred speech).     Sensory: No sensory deficit.     Motor: No abnormal muscle tone or seizure activity.     Coordination: Coordination normal.      ED Treatments / Results  Labs (all labs ordered are listed, but only abnormal results are displayed) Labs Reviewed - No data to display  EKG None  Radiology No results found.  Procedures Procedures (including critical care time)  Medications Ordered in ED Medications - No data to display   Initial Impression / Assessment and Plan / ED Course  I have reviewed the triage vital signs and the nursing notes.  Pertinent labs & imaging results that were available during my care of the patient were reviewed by me and considered in my medical decision making (see chart for details).  Clinical Course as of Jan 06 1309  Sun Jan 06, 2019  2041 Last abx dose was 1130 am   [JK]    Clinical Course User Index [JK] Dorie Rank, MD     Pt with complicated medical history, pancreatic ca, recurrent bacteremia, sepsis.  Currently on IV abx at home.  No fever in the ED although pt had one at home.  Initial lactic acid level normal.  BP improved with fluids.  CXR with infiltrate vs atelectasis.  Pt was feeling better after treatment.  Doubt recurrent sepsis/pneumonia.  Results discussed with pt, wife.  Care turned over to Dr Roxanne Mins but anticipate dc.  Pt appears stable and is already on outpatient IV abx.  C  Final Clinical Impressions(s) / ED Diagnoses  pending    Dorie Rank, MD 01/07/19 1316

## 2019-01-06 NOTE — Progress Notes (Signed)
A consult was received from an ED physician for Cefepime per pharmacy dosing.  The patient's profile has been reviewed for ht/wt/allergies/indication/available labs.    A one time order has been placed for Cefepime 2gm IV.  Further antibiotics/pharmacy consults should be ordered by admitting physician if indicated.                       Thank you, Everette Rank, PharmD 01/06/2019  8:44 PM

## 2019-01-07 DIAGNOSIS — Z452 Encounter for adjustment and management of vascular access device: Secondary | ICD-10-CM | POA: Diagnosis not present

## 2019-01-07 DIAGNOSIS — B377 Candidal sepsis: Secondary | ICD-10-CM | POA: Diagnosis not present

## 2019-01-07 DIAGNOSIS — T8579XD Infection and inflammatory reaction due to other internal prosthetic devices, implants and grafts, subsequent encounter: Secondary | ICD-10-CM | POA: Diagnosis not present

## 2019-01-07 NOTE — ED Provider Notes (Signed)
Care assumed from Dr. Tomi Bamberger, patient with cancer and head of pancreas and ALT currently on IV antibiotics at home coming in with episodes of fever.  He has been afebrile here.  ED work-up significant for mild elevation of transaminases which is new, mild hyponatremia, stable anemia.  Urinalysis shows no evidence of infection.  Chest x-ray shows atelectasis versus retrocardiac infiltrate.  Clinically, he does not have pneumonia and is currently on antibiotics.  He is felt to be safe for discharge.  He is to follow-up with his oncologist, return if symptoms worsen.   Delora Fuel, MD 83/37/44 [phone]

## 2019-01-07 NOTE — Discharge Instructions (Addendum)
Return if you are having any problems. 

## 2019-01-08 ENCOUNTER — Telehealth: Payer: Self-pay | Admitting: Nurse Practitioner

## 2019-01-08 ENCOUNTER — Telehealth: Payer: Self-pay | Admitting: *Deleted

## 2019-01-08 ENCOUNTER — Encounter: Payer: Self-pay | Admitting: *Deleted

## 2019-01-08 NOTE — Telephone Encounter (Signed)
Scheduled appt per 7/06 sch message - unable to reach pt . Left message with appt date and time   

## 2019-01-08 NOTE — Progress Notes (Signed)
Labs from home health drawn on 01/07/19 received and forwarded to MD. Scheduling message sent to schedule for lab/flush/ov/chemo week of 01/14/19 per Dr. Benay Spice.

## 2019-01-08 NOTE — Telephone Encounter (Signed)
Left VM requesting return call with update on how his temp is doing and how he is feeling.

## 2019-01-09 ENCOUNTER — Ambulatory Visit (HOSPITAL_COMMUNITY): Payer: BC Managed Care – PPO

## 2019-01-09 ENCOUNTER — Other Ambulatory Visit: Payer: Self-pay

## 2019-01-09 ENCOUNTER — Emergency Department (HOSPITAL_COMMUNITY): Payer: BC Managed Care – PPO

## 2019-01-09 ENCOUNTER — Inpatient Hospital Stay: Payer: BC Managed Care – PPO | Attending: Nurse Practitioner | Admitting: Medical

## 2019-01-09 ENCOUNTER — Telehealth: Payer: Self-pay | Admitting: *Deleted

## 2019-01-09 ENCOUNTER — Telehealth: Payer: Self-pay | Admitting: Medical

## 2019-01-09 ENCOUNTER — Inpatient Hospital Stay: Payer: BC Managed Care – PPO

## 2019-01-09 ENCOUNTER — Other Ambulatory Visit: Payer: Self-pay | Admitting: Medical

## 2019-01-09 ENCOUNTER — Encounter (HOSPITAL_COMMUNITY): Payer: Self-pay | Admitting: *Deleted

## 2019-01-09 ENCOUNTER — Inpatient Hospital Stay (HOSPITAL_COMMUNITY)
Admission: EM | Admit: 2019-01-09 | Discharge: 2019-01-16 | DRG: 435 | Disposition: A | Discharge status: Home / Self Care | Payer: BC Managed Care – PPO | Attending: Internal Medicine | Admitting: Internal Medicine

## 2019-01-09 VITALS — BP 135/98 | HR 82 | Temp 99.1°F | Resp 17

## 2019-01-09 DIAGNOSIS — R161 Splenomegaly, not elsewhere classified: Secondary | ICD-10-CM | POA: Diagnosis present

## 2019-01-09 DIAGNOSIS — R188 Other ascites: Secondary | ICD-10-CM | POA: Diagnosis not present

## 2019-01-09 DIAGNOSIS — I38 Endocarditis, valve unspecified: Secondary | ICD-10-CM | POA: Diagnosis present

## 2019-01-09 DIAGNOSIS — G4700 Insomnia, unspecified: Secondary | ICD-10-CM | POA: Diagnosis not present

## 2019-01-09 DIAGNOSIS — S22078A Other fracture of T9-T10 vertebra, initial encounter for closed fracture: Secondary | ICD-10-CM | POA: Diagnosis present

## 2019-01-09 DIAGNOSIS — I10 Essential (primary) hypertension: Secondary | ICD-10-CM | POA: Diagnosis present

## 2019-01-09 DIAGNOSIS — Z1159 Encounter for screening for other viral diseases: Secondary | ICD-10-CM | POA: Diagnosis not present

## 2019-01-09 DIAGNOSIS — Z7901 Long term (current) use of anticoagulants: Secondary | ICD-10-CM | POA: Diagnosis not present

## 2019-01-09 DIAGNOSIS — R945 Abnormal results of liver function studies: Secondary | ICD-10-CM

## 2019-01-09 DIAGNOSIS — R627 Adult failure to thrive: Secondary | ICD-10-CM | POA: Diagnosis present

## 2019-01-09 DIAGNOSIS — C250 Malignant neoplasm of head of pancreas: Secondary | ICD-10-CM | POA: Diagnosis present

## 2019-01-09 DIAGNOSIS — K831 Obstruction of bile duct: Secondary | ICD-10-CM | POA: Diagnosis present

## 2019-01-09 DIAGNOSIS — R509 Fever, unspecified: Secondary | ICD-10-CM

## 2019-01-09 DIAGNOSIS — R7881 Bacteremia: Secondary | ICD-10-CM

## 2019-01-09 DIAGNOSIS — Z9689 Presence of other specified functional implants: Secondary | ICD-10-CM | POA: Diagnosis not present

## 2019-01-09 DIAGNOSIS — S3991XA Unspecified injury of abdomen, initial encounter: Secondary | ICD-10-CM | POA: Diagnosis not present

## 2019-01-09 DIAGNOSIS — C259 Malignant neoplasm of pancreas, unspecified: Secondary | ICD-10-CM | POA: Diagnosis not present

## 2019-01-09 DIAGNOSIS — B955 Unspecified streptococcus as the cause of diseases classified elsewhere: Secondary | ICD-10-CM | POA: Diagnosis not present

## 2019-01-09 DIAGNOSIS — W010XXA Fall on same level from slipping, tripping and stumbling without subsequent striking against object, initial encounter: Secondary | ICD-10-CM | POA: Diagnosis present

## 2019-01-09 DIAGNOSIS — R1011 Right upper quadrant pain: Secondary | ICD-10-CM

## 2019-01-09 DIAGNOSIS — I330 Acute and subacute infective endocarditis: Secondary | ICD-10-CM | POA: Diagnosis not present

## 2019-01-09 DIAGNOSIS — R531 Weakness: Secondary | ICD-10-CM

## 2019-01-09 DIAGNOSIS — B377 Candidal sepsis: Secondary | ICD-10-CM | POA: Diagnosis not present

## 2019-01-09 DIAGNOSIS — K766 Portal hypertension: Secondary | ICD-10-CM | POA: Diagnosis present

## 2019-01-09 DIAGNOSIS — B957 Other staphylococcus as the cause of diseases classified elsewhere: Secondary | ICD-10-CM | POA: Diagnosis present

## 2019-01-09 DIAGNOSIS — F419 Anxiety disorder, unspecified: Secondary | ICD-10-CM | POA: Diagnosis not present

## 2019-01-09 DIAGNOSIS — R180 Malignant ascites: Secondary | ICD-10-CM | POA: Diagnosis not present

## 2019-01-09 DIAGNOSIS — S3993XA Unspecified injury of pelvis, initial encounter: Secondary | ICD-10-CM | POA: Diagnosis not present

## 2019-01-09 DIAGNOSIS — Z79899 Other long term (current) drug therapy: Secondary | ICD-10-CM | POA: Diagnosis not present

## 2019-01-09 DIAGNOSIS — S22009A Unspecified fracture of unspecified thoracic vertebra, initial encounter for closed fracture: Secondary | ICD-10-CM

## 2019-01-09 DIAGNOSIS — Z8619 Personal history of other infectious and parasitic diseases: Secondary | ICD-10-CM | POA: Diagnosis not present

## 2019-01-09 DIAGNOSIS — W19XXXD Unspecified fall, subsequent encounter: Secondary | ICD-10-CM | POA: Diagnosis not present

## 2019-01-09 DIAGNOSIS — A408 Other streptococcal sepsis: Secondary | ICD-10-CM | POA: Diagnosis not present

## 2019-01-09 DIAGNOSIS — B952 Enterococcus as the cause of diseases classified elsewhere: Secondary | ICD-10-CM | POA: Diagnosis not present

## 2019-01-09 DIAGNOSIS — T451X5A Adverse effect of antineoplastic and immunosuppressive drugs, initial encounter: Secondary | ICD-10-CM | POA: Diagnosis present

## 2019-01-09 DIAGNOSIS — S2241XA Multiple fractures of ribs, right side, initial encounter for closed fracture: Secondary | ICD-10-CM | POA: Diagnosis present

## 2019-01-09 DIAGNOSIS — T85590A Other mechanical complication of bile duct prosthesis, initial encounter: Secondary | ICD-10-CM | POA: Diagnosis not present

## 2019-01-09 DIAGNOSIS — Z9181 History of falling: Secondary | ICD-10-CM

## 2019-01-09 DIAGNOSIS — Z85828 Personal history of other malignant neoplasm of skin: Secondary | ICD-10-CM | POA: Diagnosis not present

## 2019-01-09 DIAGNOSIS — H9190 Unspecified hearing loss, unspecified ear: Secondary | ICD-10-CM | POA: Diagnosis present

## 2019-01-09 DIAGNOSIS — C787 Secondary malignant neoplasm of liver and intrahepatic bile duct: Secondary | ICD-10-CM | POA: Diagnosis not present

## 2019-01-09 DIAGNOSIS — R110 Nausea: Secondary | ICD-10-CM | POA: Diagnosis not present

## 2019-01-09 DIAGNOSIS — R079 Chest pain, unspecified: Secondary | ICD-10-CM | POA: Diagnosis not present

## 2019-01-09 DIAGNOSIS — A419 Sepsis, unspecified organism: Secondary | ICD-10-CM

## 2019-01-09 DIAGNOSIS — E785 Hyperlipidemia, unspecified: Secondary | ICD-10-CM | POA: Diagnosis present

## 2019-01-09 DIAGNOSIS — K8050 Calculus of bile duct without cholangitis or cholecystitis without obstruction: Secondary | ICD-10-CM | POA: Diagnosis not present

## 2019-01-09 DIAGNOSIS — Z87442 Personal history of urinary calculi: Secondary | ICD-10-CM | POA: Diagnosis not present

## 2019-01-09 DIAGNOSIS — A401 Sepsis due to streptococcus, group B: Secondary | ICD-10-CM | POA: Diagnosis not present

## 2019-01-09 DIAGNOSIS — W19XXXA Unspecified fall, initial encounter: Secondary | ICD-10-CM | POA: Diagnosis not present

## 2019-01-09 DIAGNOSIS — Z8249 Family history of ischemic heart disease and other diseases of the circulatory system: Secondary | ICD-10-CM

## 2019-01-09 DIAGNOSIS — B954 Other streptococcus as the cause of diseases classified elsewhere: Secondary | ICD-10-CM | POA: Diagnosis present

## 2019-01-09 DIAGNOSIS — R17 Unspecified jaundice: Secondary | ICD-10-CM

## 2019-01-09 DIAGNOSIS — D61810 Antineoplastic chemotherapy induced pancytopenia: Secondary | ICD-10-CM | POA: Diagnosis present

## 2019-01-09 DIAGNOSIS — B49 Unspecified mycosis: Secondary | ICD-10-CM | POA: Diagnosis not present

## 2019-01-09 DIAGNOSIS — Z9221 Personal history of antineoplastic chemotherapy: Secondary | ICD-10-CM

## 2019-01-09 DIAGNOSIS — L299 Pruritus, unspecified: Secondary | ICD-10-CM | POA: Diagnosis not present

## 2019-01-09 DIAGNOSIS — R7989 Other specified abnormal findings of blood chemistry: Secondary | ICD-10-CM | POA: Diagnosis present

## 2019-01-09 DIAGNOSIS — R296 Repeated falls: Secondary | ICD-10-CM | POA: Diagnosis present

## 2019-01-09 DIAGNOSIS — B379 Candidiasis, unspecified: Secondary | ICD-10-CM | POA: Diagnosis not present

## 2019-01-09 LAB — CBC WITH DIFFERENTIAL (CANCER CENTER ONLY)
Abs Immature Granulocytes: 0.02 10*3/uL (ref 0.00–0.07)
Basophils Absolute: 0 10*3/uL (ref 0.0–0.1)
Basophils Relative: 1 %
Eosinophils Absolute: 0 10*3/uL (ref 0.0–0.5)
Eosinophils Relative: 0 %
HCT: 33.5 % — ABNORMAL LOW (ref 39.0–52.0)
Hemoglobin: 10.7 g/dL — ABNORMAL LOW (ref 13.0–17.0)
Immature Granulocytes: 1 %
Lymphocytes Relative: 4 %
Lymphs Abs: 0.2 10*3/uL — ABNORMAL LOW (ref 0.7–4.0)
MCH: 28.1 pg (ref 26.0–34.0)
MCHC: 31.9 g/dL (ref 30.0–36.0)
MCV: 87.9 fL (ref 80.0–100.0)
Monocytes Absolute: 0.3 10*3/uL (ref 0.1–1.0)
Monocytes Relative: 7 %
Neutro Abs: 3.6 10*3/uL (ref 1.7–7.7)
Neutrophils Relative %: 87 %
Platelet Count: 63 10*3/uL — ABNORMAL LOW (ref 150–400)
RBC: 3.81 MIL/uL — ABNORMAL LOW (ref 4.22–5.81)
RDW: 15.4 % (ref 11.5–15.5)
WBC Count: 4.1 10*3/uL (ref 4.0–10.5)
nRBC: 0 % (ref 0.0–0.2)

## 2019-01-09 LAB — CMP (CANCER CENTER ONLY)
ALT: 175 U/L — ABNORMAL HIGH (ref 0–44)
AST: 268 U/L (ref 15–41)
Albumin: 2.1 g/dL — ABNORMAL LOW (ref 3.5–5.0)
Alkaline Phosphatase: 1031 U/L — ABNORMAL HIGH (ref 38–126)
Anion gap: 8 (ref 5–15)
BUN: 7 mg/dL — ABNORMAL LOW (ref 8–23)
CO2: 27 mmol/L (ref 22–32)
Calcium: 8 mg/dL — ABNORMAL LOW (ref 8.9–10.3)
Chloride: 95 mmol/L — ABNORMAL LOW (ref 98–111)
Creatinine: 0.67 mg/dL (ref 0.61–1.24)
GFR, Est AFR Am: >60 mL/min (ref >60)
GFR, Estimated: >60 mL/min (ref >60)
Glucose, Bld: 110 mg/dL — ABNORMAL HIGH (ref 70–99)
Potassium: 4 mmol/L (ref 3.5–5.1)
Sodium: 130 mmol/L — ABNORMAL LOW (ref 135–145)
Total Bilirubin: 8 mg/dL (ref 0.3–1.2)
Total Protein: 5 g/dL — ABNORMAL LOW (ref 6.5–8.1)

## 2019-01-09 LAB — SAMPLE TO BLOOD BANK

## 2019-01-09 LAB — SARS CORONAVIRUS 2 BY RT PCR (HOSPITAL ORDER, PERFORMED IN ~~LOC~~ HOSPITAL LAB): SARS Coronavirus 2: NEGATIVE

## 2019-01-09 LAB — MAGNESIUM: Magnesium: 1.6 mg/dL — ABNORMAL LOW (ref 1.7–2.4)

## 2019-01-09 MED ORDER — LOPERAMIDE HCL 2 MG PO TABS: 2.0000 mg | ORAL_TABLET | Freq: Three times a day (TID) | ORAL | Status: DC | PRN

## 2019-01-09 MED ORDER — CASPOFUNGIN IV (FOR PTA / DISCHARGE USE ONLY): 50.0000 mg | INTRAVENOUS | Status: DC

## 2019-01-09 MED ORDER — SODIUM CHLORIDE 0.9 % IV SOLN
1.0000 g | INTRAVENOUS | Status: DC
  Administered 2019-01-09: 21:00:00 1000 mg via INTRAVENOUS
  Filled 2019-01-09 (×2): qty 1

## 2019-01-09 MED ORDER — DOXAZOSIN MESYLATE 1 MG PO TABS
1.0000 mg | ORAL_TABLET | Freq: Every day | ORAL | Status: DC
  Administered 2019-01-09 – 2019-01-15 (×7): 1 mg via ORAL
  Filled 2019-01-09 (×7): qty 1

## 2019-01-09 MED ORDER — SODIUM CHLORIDE 0.9 % IV SOLN
Freq: Once | INTRAVENOUS | Status: AC
  Administered 2019-01-09: 21:00:00 via INTRAVENOUS

## 2019-01-09 MED ORDER — MAGNESIUM OXIDE 400 (241.3 MG) MG PO TABS
400.0000 mg | ORAL_TABLET | Freq: Two times a day (BID) | ORAL | Status: DC
  Administered 2019-01-09 – 2019-01-16 (×13): 400 mg via ORAL
  Filled 2019-01-09 (×13): qty 1

## 2019-01-09 MED ORDER — OXYCODONE HCL 5 MG PO TABS
5.0000 mg | ORAL_TABLET | ORAL | Status: DC | PRN
  Administered 2019-01-11 – 2019-01-16 (×11): 5 mg via ORAL
  Filled 2019-01-09 (×12): qty 1

## 2019-01-09 MED ORDER — AMLODIPINE BESYLATE 10 MG PO TABS
10.0000 mg | ORAL_TABLET | Freq: Every day | ORAL | Status: DC
  Administered 2019-01-09 – 2019-01-15 (×7): 10 mg via ORAL
  Filled 2019-01-09 (×3): qty 1
  Filled 2019-01-09: qty 2
  Filled 2019-01-09 (×3): qty 1

## 2019-01-09 MED ORDER — POTASSIUM CHLORIDE CRYS ER 20 MEQ PO TBCR
20.0000 meq | EXTENDED_RELEASE_TABLET | Freq: Two times a day (BID) | ORAL | Status: DC
  Administered 2019-01-09 – 2019-01-16 (×13): 20 meq via ORAL
  Filled 2019-01-09 (×13): qty 1

## 2019-01-09 MED ORDER — HYDROCORTISONE (PERIANAL) 2.5 % EX CREA
1.0000 "application " | TOPICAL_CREAM | Freq: Four times a day (QID) | CUTANEOUS | Status: DC | PRN
  Filled 2019-01-09: qty 28.35

## 2019-01-09 MED ORDER — LOPERAMIDE HCL 2 MG PO CAPS: 2.0000 mg | ORAL_CAPSULE | Freq: Three times a day (TID) | ORAL | Status: DC | PRN

## 2019-01-09 MED ORDER — OXYCODONE-ACETAMINOPHEN 5-325 MG PO TABS
1.0000 | ORAL_TABLET | ORAL | Status: DC | PRN
  Administered 2019-01-11 – 2019-01-16 (×4): 1 via ORAL
  Filled 2019-01-09 (×5): qty 1

## 2019-01-09 MED ORDER — GABAPENTIN 100 MG PO CAPS
200.0000 mg | ORAL_CAPSULE | Freq: Two times a day (BID) | ORAL | Status: DC
  Administered 2019-01-09 – 2019-01-16 (×13): 200 mg via ORAL
  Filled 2019-01-09 (×13): qty 2

## 2019-01-09 MED ORDER — SODIUM CHLORIDE 0.9% FLUSH
10.0000 mL | Freq: Two times a day (BID) | INTRAVENOUS | Status: DC
  Administered 2019-01-09 – 2019-01-16 (×3): 10 mL

## 2019-01-09 MED ORDER — PROCHLORPERAZINE MALEATE 10 MG PO TABS
10.0000 mg | ORAL_TABLET | Freq: Four times a day (QID) | ORAL | Status: DC | PRN
  Administered 2019-01-11: 10 mg via ORAL
  Filled 2019-01-09: qty 1

## 2019-01-09 MED ORDER — ERTAPENEM IV (FOR PTA / DISCHARGE USE ONLY): 1.0000 g | INTRAVENOUS | Status: DC

## 2019-01-09 MED ORDER — IOHEXOL 300 MG/ML  SOLN
100.0000 mL | Freq: Once | INTRAMUSCULAR | Status: AC | PRN
  Administered 2019-01-09: 16:00:00 100 mL via INTRAVENOUS

## 2019-01-09 MED ORDER — IRBESARTAN 150 MG PO TABS
300.0000 mg | ORAL_TABLET | Freq: Every day | ORAL | Status: DC
  Administered 2019-01-11 – 2019-01-16 (×6): 300 mg via ORAL
  Filled 2019-01-09 (×3): qty 2
  Filled 2019-01-09: qty 1
  Filled 2019-01-09 (×3): qty 2

## 2019-01-09 MED ORDER — OXYCODONE-ACETAMINOPHEN 10-325 MG PO TABS: 1.0000 | ORAL_TABLET | ORAL | Status: DC | PRN

## 2019-01-09 MED ORDER — SODIUM CHLORIDE 0.9 % IV SOLN
100.0000 mg | INTRAVENOUS | Status: DC
  Administered 2019-01-09 – 2019-01-15 (×7): 100 mg via INTRAVENOUS
  Filled 2019-01-09 (×9): qty 100

## 2019-01-09 MED ORDER — HEPARIN SODIUM (PORCINE) 5000 UNIT/ML IJ SOLN
5000.0000 [IU] | Freq: Three times a day (TID) | INTRAMUSCULAR | Status: DC
  Administered 2019-01-09 – 2019-01-16 (×20): 5000 [IU] via SUBCUTANEOUS
  Filled 2019-01-09 (×20): qty 1

## 2019-01-09 MED ORDER — SODIUM CHLORIDE (PF) 0.9 % IJ SOLN
INTRAMUSCULAR | Status: AC
  Filled 2019-01-09: qty 50

## 2019-01-09 MED ORDER — SODIUM CHLORIDE 0.9% FLUSH
10.0000 mL | INTRAVENOUS | Status: DC | PRN
  Administered 2019-01-11: 22:00:00 10 mL
  Filled 2019-01-09: qty 40

## 2019-01-09 NOTE — Patient Instructions (Signed)
PICC Home Care Guide ° °A peripherally inserted central catheter (PICC) is a form of IV access that allows medicines and IV fluids to be quickly distributed throughout the body. The PICC is a long, thin, flexible tube (catheter) that is inserted into a vein in the upper arm. The catheter ends in a large vein in the chest (superior vena cava, or SVC). After the PICC is inserted, a chest X-ray may be done to make sure that it is in the correct place. °A PICC may be placed for different reasons, such as: °· To give medicines and liquid nutrition. °· To give IV fluids and blood products. °· If there is trouble placing a peripheral intravenous (PIV) catheter. °If taken care of properly, a PICC can remain in place for several months. Having a PICC can also allow a person to go home from the hospital sooner. Medicine and PICC care can be managed at home by a family member, caregiver, or home health care team. °What are the risks? °Generally, having a PICC is safe. However, problems may occur, including: °· A blood clot (thrombus) forming in or at the tip of the PICC. °· A blood clot forming in a vein (deep vein thrombosis) or traveling to the lung (pulmonary embolism). °· Inflammation of the vein (phlebitis) in which the PICC is placed. °· Infection. Central line associated blood stream infection (CLABSI) is a serious infection that often requires hospitalization. °· PICC movement (malposition). The PICC tip may move from its original position due to excessive physical activity, forceful coughing, sneezing, or vomiting. °· A break or cut in the PICC. It is important not to use scissors near the PICC. °· Nerve or tendon irritation or injury during PICC insertion. °How to take care of your PICC °Preventing problems °· You and any caregivers should wash your hands often with soap. Wash hands: °? Before touching the PICC line or the infusion device. °? Before changing a bandage (dressing). °· Flush the PICC as told by your  health care provider. Let your health care provider know right away if the PICC is hard to flush or does not flush. Do not use force to flush the PICC. °· Do not use a syringe that is less than 10 mL to flush the PICC. °· Avoid blood pressure checks on the arm in which the PICC is placed. °· Never pull or tug on the PICC. °· Do not take the PICC out yourself. Only a trained clinical professional should remove the PICC. °· Use clean and sterile supplies only. Keep the supplies in a dry place. Do not reuse needles, syringes, or any other supplies. Doing that can lead to infection. °· Keep pets and children away from your PICC line. °· Check the PICC insertion site every day for signs of infection. Check for: °? Leakage. °? Redness, swelling, or pain. °? Fluid or blood. °? Warmth. °? Pus or a bad smell. °PICC dressing care °· Keep your PICC bandage (dressing) clean and dry to prevent infection. °· Do not take baths, swim, or use a hot tub until your health care provider approves. Ask your health care provider if you can take showers. You may only be allowed to take sponge baths for bathing. When you are allowed to shower: °? Ask your health care provider to teach you how to wrap the PICC line. °? Cover the PICC line with clear plastic wrap and tape to keep it dry while showering. °· Follow instructions from your health care provider   about how to take care of your insertion site and dressing. Make sure you: °? Wash your hands with soap and water before you change your bandage (dressing). If soap and water are not available, use hand sanitizer. °? Change your dressing as told by your health care provider. °? Leave stitches (sutures), skin glue, or adhesive strips in place. These skin closures may need to stay in place for 2 weeks or longer. If adhesive strip edges start to loosen and curl up, you may trim the loose edges. Do not remove adhesive strips completely unless your health care provider tells you to do  that. °· Change your PICC dressing if it becomes loose or wet. °General instructions ° °· Carry your PICC identification card or wear a medical alert bracelet at all times. °· Keep the tube clamped at all times, unless it is being used. °· Carry a smooth-edge clamp with you at all times to place on the tube if it breaks. °· Do not use scissors or sharp objects near the tube. °· You may bend your arm and move it freely. If your PICC is near or at the bend of your elbow, avoid activity with repeated motion at the elbow. °· Avoid lifting heavy objects as told by your health care provider. °· Keep all follow-up visits as told by your health care provider. This is important. °Disposal of supplies °· Throw away any syringes in a disposal container that is meant for sharp items (sharps container). You can buy a sharps container from a pharmacy, or you can make one by using an empty hard plastic bottle with a cover. °· Place any used dressings or infusion bags into a plastic bag. Throw that bag in the trash. °Contact a health care provider if: °· You have pain in your arm, ear, face, or teeth. °· You have a fever or chills. °· You have redness, swelling, or pain around the insertion site. °· You have fluid or blood coming from the insertion site. °· Your insertion site feels warm to the touch. °· You have pus or a bad smell coming from the insertion site. °· Your skin feels hard and raised around the insertion site. °Get help right away if: °· Your PICC is accidentally pulled all the way out. If this happens, cover the insertion site with a bandage or gauze dressing. Do not throw the PICC away. Your health care provider will need to check it. °· Your PICC was tugged or pulled and has partially come out. Do not  push the PICC back in. °· You cannot flush the PICC, it is hard to flush, or the PICC leaks around the insertion site when it is flushed. °· You hear a "flushing" sound when the PICC is flushed. °· You feel your  heart racing or skipping beats. °· There is a hole or tear in the PICC. °· You have swelling in the arm in which the PICC was inserted. °· You have a red streak going up your arm from where the PICC was inserted. °Summary °· A peripherally inserted central catheter (PICC) is a long, thin, flexible tube (catheter) that is inserted into a vein in the upper arm. °· The PICC is inserted using a sterile technique by a specially trained nurse or physician. Only a trained clinical professional should remove it. °· Keep your PICC identification card with you at all times. °· Avoid blood pressure checks on the arm in which the PICC is placed. °· If cared for   properly, a PICC can remain in place for several months. Having a PICC can also allow a person to go home from the hospital sooner. °This information is not intended to replace advice given to you by your health care provider. Make sure you discuss any questions you have with your health care provider. °Document Released: 12/25/2002 Document Revised: 06/02/2017 Document Reviewed: 07/23/2016 °Elsevier Patient Education © 2020 [employer] ° °

## 2019-01-09 NOTE — ED Triage Notes (Signed)
Pt sent from [facility] d/t labs showing increased bilirubin. Pt has stent in bile duct.  Pt has had fever for the past couple of days. Pt also reports increased ascites. Pt had labs and blood cultures drawn prior to arrival.

## 2019-01-09 NOTE — Progress Notes (Signed)
Second set of blood cultures drawn in [facility] from PICC line, first set drawn in lab by phlebotomist.  Both sets drawn before any IV abx started.  Pt taken by [facility] to ED with belongings by PA Lucianne Lei.  Telephone/bedside report given by PA Lucianne Lei to ED staff.

## 2019-01-09 NOTE — ED Provider Notes (Signed)
[facility] DEPT Provider Note   CSN: [id number] Arrival date & time: 01/09/19  1434    History   Chief Complaint Chief Complaint  Patient presents with   Abnormal Lab    HPI Cory Kelly is a 63 y.o. male.     HPI Patient presents with elevated bilirubin.  Sent in from oncologist.  Has a biliary stent due to pancreatic cancer.  Has had a fever on and off recently.  Had his port pulled out due to the fever.  Currently has a PICC line and is on ertapenem and caspofungin.  Been feeling weak overall.  States no more fevers but did have a fall a couple days ago.  Hit his right upper flank/chest area.  Went to oncologist and had mildly elevated LFTs but increasing bilirubin up to 8.  Sent in for further treatment.  Had lab work done there already.  Sees Dr. Ardis Hughs from [facility] gastroenterology who is aware that he is here.  Also sees Dr. Benay Spice from oncology.  No difficulty breathing. Past Medical History:  Diagnosis Date   Abdominal pain    due to bloating   Bloating    Cancer (HCC)    skin cancer   Essential hypertension    Fatigue    History of kidney stones    passed   History of weight loss    HOH (hard of hearing)    no hearing aids   Neuralgia    Pancreatic cancer [facility])    Stress    loss of father in Dec 2018.    Patient Active Problem List   Diagnosis Date Noted   Candidemia ([place]) 01/01/2019   Streptococcal bacteremia 12/31/2018   Sepsis ([place]) 12/24/2018   Bacteroides infection    Fever, unspecified 11/05/2018   Episode of transient neurologic symptoms 11/04/2018   Biliary stent migration, initial encounter    Pancreatic cancer ([place]) 07/20/2018   Disorder of bile duct stent 07/20/2018   Jaundice 07/20/2018   History of ERCP 07/20/2018   Mass of pancreas 07/20/2018   Goals of care, counseling/discussion 07/04/2018   Cholangitis 06/22/2018   Port-A-Cath in place 08/31/2017   Primary cancer of head  of pancreas ([place]) 08/21/2017   Abnormal pancreas function test    Pancreatic abnormality    History Basal cell adenocarcinoma 08/04/2017   Elevated LFTs 08/04/2017   Tinnitus-bilat 03/05/2014   Elevated serum creatinine 11/20/2012   Elevated PSA 03/05/2012   Essential hypertension 11/02/2011   Hyperlipemia 11/02/2011    Past Surgical History:  Procedure Laterality Date   APPENDECTOMY     BILIARY STENT PLACEMENT N/A 06/25/2018   Procedure: BILIARY STENT PLACEMENT;  Surgeon: Irving Copas., MD;  Location: [facility] ENDOSCOPY;  Service: Gastroenterology;  Laterality: N/A;   BILIARY STENT PLACEMENT  07/20/2018   Procedure: BILIARY STENT PLACEMENT;  Surgeon: Milus Banister, MD;  Location: [facility] ENDOSCOPY;  Service: Gastroenterology;;   BILIARY STENT PLACEMENT N/A 08/16/2018   Procedure: BILIARY STENT PLACEMENT;  Surgeon: Milus Banister, MD;  Location: [facility] ENDOSCOPY;  Service: Endoscopy;  Laterality: N/A;   ENDOSCOPIC RETROGRADE CHOLANGIOPANCREATOGRAPHY (ERCP) WITH PROPOFOL N/A 08/10/2017   Procedure: ENDOSCOPIC RETROGRADE CHOLANGIOPANCREATOGRAPHY (ERCP) WITH PROPOFOL;  Surgeon: Milus Banister, MD;  Location: [facility] ENDOSCOPY;  Service: Endoscopy;  Laterality: N/A;   ENDOSCOPIC RETROGRADE CHOLANGIOPANCREATOGRAPHY (ERCP) WITH PROPOFOL N/A 07/20/2018   Procedure: ENDOSCOPIC RETROGRADE CHOLANGIOPANCREATOGRAPHY (ERCP) WITH PROPOFOL;  Surgeon: Milus Banister, MD;  Location[facility] ENDOSCOPY;  Service: Gastroenterology;  Laterality: N/A;  Balloon  Sweeps   ENDOSCOPIC RETROGRADE CHOLANGIOPANCREATOGRAPHY (ERCP) WITH PROPOFOL N/A 08/16/2018   Procedure: ENDOSCOPIC RETROGRADE CHOLANGIOPANCREATOGRAPHY (ERCP) WITH PROPOFOL;  Surgeon: Milus Banister, MD;  Location: [facility] ENDOSCOPY;  Service: Endoscopy;  Laterality: N/A;   ENDOSCOPIC RETROGRADE CHOLANGIOPANCREATOGRAPHY (ERCP) WITH PROPOFOL N/A 12/06/2018   Procedure: ENDOSCOPIC RETROGRADE CHOLANGIOPANCREATOGRAPHY (ERCP) WITH PROPOFOL;  Surgeon: Milus Banister, MD;  Location: [facility] ENDOSCOPY;  Service: Endoscopy;  Laterality: N/A;  cbd stent sweep   ERCP N/A 06/25/2018   Procedure: ENDOSCOPIC RETROGRADE CHOLANGIOPANCREATOGRAPHY (ERCP);  Surgeon: Irving Copas., MD;  Location: [facility] ENDOSCOPY;  Service: Gastroenterology;  Laterality: N/A;   EUS N/A 08/10/2017   Procedure: UPPER ENDOSCOPIC ULTRASOUND (EUS) RADIAL;  Surgeon: Milus Banister, MD;  Location: [facility] ENDOSCOPY;  Service: Endoscopy;  Laterality: N/A;   EYE SURGERY     lasik/left eye   IR FLUORO GUIDE PORT INSERTION RIGHT  08/30/2017   IR REMOVAL TUN ACCESS W/ PORT W/O FL MOD SED  12/27/2018   IR US GUIDE VASC ACCESS RIGHT  08/30/2017   REMOVAL OF STONES  06/25/2018   Procedure: REMOVAL OF STONES;  Surgeon: Irving Copas., MD;  Location: [facility] ENDOSCOPY;  Service: Gastroenterology;;   SKIN CANCER EXCISION     STENT REMOVAL  07/20/2018   Procedure: STENT REMOVAL;  Surgeon: Milus Banister, MD;  Location: [facility] ENDOSCOPY;  Service: Gastroenterology;;   Lavell Islam REMOVAL  08/16/2018   Procedure: STENT REMOVAL;  Surgeon: Milus Banister, MD;  Location: [facility] ENDOSCOPY;  Service: Endoscopy;;   TEE WITHOUT CARDIOVERSION N/A 11/08/2018   Procedure: TRANSESOPHAGEAL ECHOCARDIOGRAM (TEE);  Surgeon: Acie Fredrickson Wonda Cheng, MD;  Location: [facility] ENDOSCOPY;  Service: Cardiovascular;  Laterality: N/A;        Home Medications    Prior to Admission medications   Medication Sig Start Date End Date Taking? Authorizing Provider  amLODipine (NORVASC) 10 MG tablet Take 10 mg by mouth at bedtime.  05/21/18  Yes [provider]  caspofungin (CANCIDAS) IVPB Inject 50 mg into the vein daily. Indication: polymicrobial sepsis, including C. glabrata Last Day of Therapy: 02/09/2019 Labs weekly while on IV antibiotics: _X_ CBC with differential _X_ CMP 01/05/19 02/09/19 Yes Rai, Ripudeep K, MD  doxazosin (CARDURA) 1 MG tablet Take 1 mg by mouth at bedtime.   Yes [provider]  ertapenem (INVANZ)  IVPB Inject 1 g into the vein daily for 21 days. Indication: polymicrobial sepsis with Grp C strep, S. anginosis, bacteroides.  Last Day of Therapy:  01/26/2019 Labs weekly while on IV antibiotics: _X_ CBC with differential _X_ CMP 01/05/19 01/26/19 Yes Rai, Ripudeep K, MD  ferrous sulfate 325 (65 FE) MG tablet Take 1 tablet (325 mg total) by mouth 2 (two) times daily with a meal. 01/04/19  Yes Rai, Ripudeep K, MD  gabapentin (NEURONTIN) 100 MG capsule Take 2 capsules (200 mg total) by mouth 2 (two) times daily. 12/03/18  Yes Owens Shark, NP  lidocaine-prilocaine (EMLA) cream Apply 1 application topically as needed. Apply to portacath site 1-2 hours prior to use Patient taking differently: Apply 1 application topically daily as needed (1-2 hours prior to portacath access).  08/20/18  Yes Owens Shark, NP  loperamide (IMODIUM A-D) 2 MG tablet Take 1 tablet (2 mg total) by mouth as directed. Patient taking differently: Take 2-4 mg by mouth 3 (three) times daily as needed for diarrhea or loose stools.  08/22/18  Yes Ladell Pier, MD  olmesartan (BENICAR) 40 MG tablet Take 1 tablet (40 mg total) by  mouth daily. 04/04/18  Yes Weber, Damaris Hippo, PA-C  oxyCODONE-acetaminophen (PERCOCET) 10-325 MG tablet Take 1 tablet by mouth every 4 (four) hours as needed for pain. 12/28/18  Yes Owens Shark, NP  potassium chloride SA (K-DUR) 20 MEQ tablet Take 1 tablet (20 mEq total) by mouth 2 (two) times daily. 12/28/18  Yes Ladell Pier, MD  [facility] ACETATE EX Apply 1 application topically daily as needed (itching).   Yes [provider]  Probiotic Product (ALIGN) 4 MG CAPS Take 1 capsule by mouth daily.   Yes [provider]  guaiFENesin-dextromethorphan (ROBITUSSIN DM) 100-10 MG/5ML syrup Take 5 mLs by mouth every 4 (four) hours as needed for cough (chest congestion). 01/04/19   Rai, Vernelle Emerald, MD  hydrocortisone (ANUSOL-HC) 2.5 % rectal cream Apply 1 application topically 4 (four) times daily  as needed for hemorrhoids. 01/04/19   Rai, Vernelle Emerald, MD  prochlorperazine (COMPAZINE) 10 MG tablet Take 1 tablet (10 mg total) by mouth every 6 (six) hours as needed for nausea or vomiting. 01/04/19   Rai, Vernelle Emerald, MD  tamsulosin (FLOMAX) 0.4 MG CAPS capsule Take 1 capsule (0.4 mg total) by mouth daily after supper. Patient not taking: Reported on 01/06/2019 12/27/18   Damita Lack, MD    Family History Family History  Problem Relation Age of Onset   Hyperlipidemia Father    Heart disease Father    Dementia Father    Memory loss Mother    Colon cancer Neg Hx    Esophageal cancer Neg Hx    Inflammatory bowel disease Neg Hx    Liver disease Neg Hx    Rectal cancer Neg Hx    Stomach cancer Neg Hx    Pancreatic cancer Neg Hx     Social History Social History   Tobacco Use   Smoking status: Never Smoker   Smokeless tobacco: Never Used  Substance Use Topics   Alcohol use: No   Drug use: No     Allergies   Patient has no known allergies.   Review of Systems Review of Systems  Constitutional: Positive for appetite change. Negative for fever.  HENT: Negative for congestion.   Cardiovascular: Positive for chest pain.  Gastrointestinal: Positive for abdominal pain.  Genitourinary: Positive for flank pain.  Musculoskeletal: Negative for back pain.  Skin: Negative for wound.  Neurological: Negative for weakness.     Physical Exam Updated Vital Signs BP 132/84    Pulse 87    Temp 98.6 F (37 C) (Oral)    Resp 15    SpO2 97%   Physical Exam Vitals signs and nursing note reviewed.  Constitutional:      Comments: Patient is chronically ill-appearing.  HENT:     Head: Atraumatic.     Mouth/Throat:     Mouth: Mucous membranes are moist.  Eyes:     General: Scleral icterus present.  Neck:     Musculoskeletal: Neck supple.  Cardiovascular:     Rate and Rhythm: Normal rate.  Pulmonary:     Effort: No respiratory distress.     Comments: Tenderness  to right lateral lower ribs.  No subcu emphysema. Chest:     Chest wall: Tenderness present.  Abdominal:     Tenderness: There is abdominal tenderness.     Comments: Some right upper quadrant tenderness without rebound or guarding.  Musculoskeletal:     Right lower leg: No edema.     Left lower leg: No edema.  Skin:  Capillary Refill: Capillary refill takes less than 2 seconds.     Coloration: Skin is jaundiced.  Neurological:     Mental Status: He is alert. Mental status is at baseline.      ED Treatments / Results  Labs (all labs ordered are listed, but only abnormal results are displayed) Labs Reviewed  URINE CULTURE  URINALYSIS, ROUTINE W REFLEX MICROSCOPIC    EKG None  Radiology Dg Ribs Unilateral W/chest Right  Result Date: 01/09/2019 CLINICAL DATA:  Fever, increasing ascites, fall several days prior, pain on right side chest. EXAM: RIGHT RIBS AND CHEST - 3+ VIEW COMPARISON:  Radiograph 01/06/2019, 12/28/2018 FINDINGS: Radiopaque marker placed at the level of the lateral right tenth rib, no subjacent fracture. No visible displaced rib fractures or other acute osseous abnormality. Degenerative changes in the spine and shoulders. Right upper extremity PICC tip terminates at the superior cavoatrial junction. Surgical clips and TIPS catheter noted in the right upper quadrant. No acute soft tissue abnormality. Trace right pleural effusion and bandlike areas of subsegmental atelectasis the right lung base. Additional retrocardiac opacity is similar to prior exam. Lungs otherwise clear. Cardiomediastinal contours are unremarkable. IMPRESSION: No visible displaced rib fracture or other acute osseous abnormality. Right basilar and retrocardiac opacities could reflect atelectasis though developing consolidation could have a similar appearance given setting of fever. Trace right effusion. Electronically Signed   By: MD Lovena Le   On: 01/09/2019 16:15   Ct Abdomen Pelvis W  Contrast  Result Date: 01/09/2019 CLINICAL DATA:  Fall, low back pain, pancreatic cancer with biliary stent placement EXAM: CT ABDOMEN AND PELVIS WITH CONTRAST TECHNIQUE: Multidetector CT imaging of the abdomen and pelvis was performed using the standard protocol following bolus administration of intravenous contrast. CONTRAST:  151mL OMNIPAQUE IOHEXOL 300 MG/ML  SOLN COMPARISON:  12/24/2018 FINDINGS: Lower chest: Small to moderate bilateral pleural effusions and associated atelectasis or consolidation, new compared to prior examination. Coronary artery calcifications. Hepatobiliary: No solid liver abnormality is seen. Redemonstrated common bile duct stent with slightly increased biliary ductal dilatation and redemonstrated post stenting pneumobilia. No change in appearance or configuration of stent. Pancreas: Primary pancreatic mass is not clearly appreciated on this examination, with atrophy and ductal dilatation of the pancreatic body and tail. Spleen: Splenomegaly, maximum coronal span 19.3 cm. Adrenals/Urinary Tract: Adrenal glands are unremarkable. Nonobstructive bilateral nephrolithiasis. No hydronephrosis. Bladder is unremarkable. Stomach/Bowel: Stomach is within normal limits. Appendix appears normal. No evidence of bowel wall thickening, distention, or inflammatory changes. Vascular/Lymphatic: Aortic atherosclerosis. No enlarged abdominal or pelvic lymph nodes. Reproductive: No mass or other significant abnormality. Other: No abdominal wall hernia or abnormality. Moderate to large volume ascites, which is increased compared to prior examination. Anasarca. Musculoskeletal: There are mildly displaced fracture heads of the right tenth and eleventh ribs as well as the right transverse process of the T10 vertebral body. IMPRESSION: 1. There are mildly displaced fracture heads of the right tenth and eleventh ribs as well as the right transverse process of the T10 vertebral body. 2. Redemonstrated common bile  duct stent with slightly increased biliary ductal dilatation and redemonstrated post stenting pneumobilia. No change in appearance or configuration of stent. Primary pancreatic mass is not clearly appreciated on this examination, with atrophy and ductal dilatation of the pancreatic body and tail. 3. Pleural effusions, ascites, and anasarca, new and increased compared to prior examination. Electronically Signed   By: Eddie Candle M.D.   On: 01/09/2019 16:52    Procedures Procedures (including critical care time)  Medications Ordered in ED Medications  sodium chloride (PF) 0.9 % injection (0 mLs  Hold 01/09/19 1632)  iohexol (OMNIPAQUE) 300 MG/ML solution 100 mL (100 mLs Intravenous Contrast Given 01/09/19 1613)     Initial Impression / Assessment and Plan / ED Course  I have reviewed the triage vital signs and the nursing notes.  Pertinent labs & imaging results that were available during my care of the patient were reviewed by me and considered in my medical decision making (see chart for details).        Patient with elevated bilirubin.  Worry some for infection of or obstruction of his biliary stent.  Has had recent bacteremia without clear cause.  Also had fall.  CT scan pending.  Discussed with [facility], who stated likely ERCP tomorrow.  CT scan shows 2 rib fractures and transverse process fracture.  No pneumothorax.  Gastroenterology is seen patient.  Will admit to hospitalist.  Final Clinical Impressions(s) / ED Diagnoses   Final diagnoses:  Elevated bilirubin  Fall, initial encounter  Closed fracture of multiple ribs of right side, initial encounter  Closed fracture of transverse process of thoracic vertebra, initial encounter [facility])    ED Discharge Orders    None       Davonna Belling, MD 01/09/19 1709

## 2019-01-09 NOTE — H&P (View-Only) (Signed)
Referring Provider: Dr. Ammie Dalton Primary Care Physician:  Mancel Bale, PA-C Primary Gastroenterologist:  Dr. Ardis Hughs  Reason for Consultation:  Biliary obstruction, metastatic pancreatic cancer  HPI: Cory Kelly is a 63 y.o. male known to GI was diagnosed with pancreatic cancer in the spring 2019.  He has been managed aggressively, with chemotherapy and underwent SBRT to the pancreas in October 2019.  He has since been found to have hepatic mets.  Course has been complicated by biliary obstruction.  He did have ERCP in February 2020 with removal of 2 previously placed plastic stents from within the lumen of the covered SEMS he was noted to have a stricture just proximal to the previously placed SEMS and a new 8 cm / 10 mm uncovered SEMS was placed.  MRI of the abdomen was done on 12/04/2018 which showed multiple liver metastases tiny bilateral effusions, and mild increase in the size of the pancreatic head mass.  There were stigmata of portal venous hypertension with splenomegaly and varices and increased abdominal ascites. He was noted to have a significant rise in LFTs, and therefore underwent repeat ERCP with Dr. Ardis Hughs on 12/06/2018.  The previous stent was found to be in good position but was filled with debris and possible ingrowth and there was significant narrowing of the lumen of the SCM throughout.  The stent was cleared with balloon sweeping with subsequent cholangiogram showing stent to be patent, with good flow.  Patient was again admitted at the end of June and found to have bacteremia with strep and candidiasis.  Was discharged home on July 3 with a PICC line and IV abx, etc.  Also had his Port-A-Cath removed.  Was seen at the cancer center again today.  Feels poorly.  Apparently also fell and landed on his side for which the ED physician has ordered a CT scan.  LFTs are up-trending with an AST of 268, ALT 175, alk phos 1031, and total bili 8.0.  Just 3 days ago AST was 226, ALT 94,  alk phos 834, total bili was 3.3.  Abdominal ascites also increasing.   Past Medical History:  Diagnosis Date   Abdominal pain    due to bloating   Bloating    Cancer (HCC)    skin cancer   Essential hypertension    Fatigue    History of kidney stones    passed   History of weight loss    HOH (hard of hearing)    no hearing aids   Neuralgia    Pancreatic cancer [facility])    Stress    loss of father in Dec 2018.    Past Surgical History:  Procedure Laterality Date   APPENDECTOMY     BILIARY STENT PLACEMENT N/A 06/25/2018   Procedure: BILIARY STENT PLACEMENT;  Surgeon: Irving Copas., MD;  Location: [facility] ENDOSCOPY;  Service: Gastroenterology;  Laterality: N/A;   BILIARY STENT PLACEMENT  07/20/2018   Procedure: BILIARY STENT PLACEMENT;  Surgeon: Milus Banister, MD;  Location: [facility] ENDOSCOPY;  Service: Gastroenterology;;   BILIARY STENT PLACEMENT N/A 08/16/2018   Procedure: BILIARY STENT PLACEMENT;  Surgeon: Milus Banister, MD;  Location: [facility] ENDOSCOPY;  Service: Endoscopy;  Laterality: N/A;   ENDOSCOPIC RETROGRADE CHOLANGIOPANCREATOGRAPHY (ERCP) WITH PROPOFOL N/A 08/10/2017   Procedure: ENDOSCOPIC RETROGRADE CHOLANGIOPANCREATOGRAPHY (ERCP) WITH PROPOFOL;  Surgeon: Milus Banister, MD;  Location: [facility]OS[facility];  Service: Endoscopy;  Laterality: N/A;   ENDOSCOPIC RETROGRADE CHOLANGIOPANCREATOGRAPHY (ERCP) WITH PROPOFOL N/A 07/20/2018   Procedure: ENDOSCOPIC  CHOLANGIOPANCREATOGRAPHY (ERCP) WITH PROPOFOL;  Surgeon: Jacobs, Daniel P, MD;  Location: [facility] ENDOSCOPY;  Service: Gastroenterology;  Laterality: N/A;  Balloon Sweeps  °• ENDOSCOPIC RETROGRADE CHOLANGIOPANCREATOGRAPHY (ERCP) WITH PROPOFOL N/A 08/16/2018  ° Procedure: ENDOSCOPIC RETROGRADE CHOLANGIOPANCREATOGRAPHY (ERCP) WITH PROPOFOL;  Surgeon: Jacobs, Daniel P, MD;  Location: [facility];  Service: Endoscopy;  Laterality: N/A;  °• ENDOSCOPIC RETROGRADE CHOLANGIOPANCREATOGRAPHY (ERCP) WITH PROPOFOL N/A  12/06/2018  ° Procedure: ENDOSCOPIC RETROGRADE CHOLANGIOPANCREATOGRAPHY (ERCP) WITH PROPOFOL;  Surgeon: Jacobs, Daniel P, MD;  Location: [facility];  Service: Endoscopy;  Laterality: N/A;  cbd stent sweep  °• ERCP N/A 06/25/2018  ° Procedure: ENDOSCOPIC RETROGRADE CHOLANGIOPANCREATOGRAPHY (ERCP);  Surgeon: Mansouraty, Gabriel Jr., MD;  Location: [facility];  Service: Gastroenterology;  Laterality: N/A;  °• EUS N/A 08/10/2017  ° Procedure: UPPER ENDOSCOPIC ULTRASOUND (EUS) RADIAL;  Surgeon: Jacobs, Daniel P, MD;  Location: [facility];  Service: Endoscopy;  Laterality: N/A;  °• EYE SURGERY    ° lasik/left eye  °• IR FLUORO GUIDE PORT INSERTION RIGHT  08/30/2017  °• IR REMOVAL TUN ACCESS W/ PORT W/O FL MOD SED  12/27/2018  °• IR US GUIDE VASC ACCESS RIGHT  08/30/2017  °• REMOVAL OF STONES  06/25/2018  ° Procedure: REMOVAL OF STONES;  Surgeon: Mansouraty, Gabriel Jr., MD;  Location: [facility];  Service: Gastroenterology;;  °• SKIN CANCER EXCISION    °• STENT REMOVAL  07/20/2018  ° Procedure: STENT REMOVAL;  Surgeon: Jacobs, Daniel P, MD;  Location: [facility]OSCOPY;  Service: Gastroenterology;;  °• STENT REMOVAL  08/16/2018  ° Procedure: STENT REMOVAL;  Surgeon: Jacobs, Daniel P, MD;  Location: [facility];  Service: Endoscopy;;  °• TEE WITHOUT CARDIOVERSION N/A 11/08/2018  ° Procedure: TRANSESOPHAGEAL ECHOCARDIOGRAM (TEE);  Surgeon: Nahser, Philip J, MD;  Location: [facility] ENDOSCOPY;  Service: Cardiovascular;  Laterality: N/A;  ° ° °Prior to Admission medications   °Medication Sig Start Date End Date Taking? Authorizing Provider  °amLODipine (NORVASC) 10 MG tablet Take 10 mg by mouth at bedtime.  05/21/18   [provider]  °caspofungin (CANCIDAS) IVPB Inject 50 mg into the vein daily. Indication: polymicrobial sepsis, including C. glabrata °Last Day of Therapy: 02/09/2019 °Labs weekly while on IV antibiotics: °_X_ CBC with differential °_X_ CMP 01/05/19 02/09/19  Rai, Ripudeep K, MD  °doxazosin (CARDURA) 1 MG tablet Take 1  mg by mouth at bedtime.    [provider]  °ertapenem (INVANZ) IVPB Inject 1 g into the vein daily for 21 days. Indication: polymicrobial sepsis with Grp C strep, S. anginosis, bacteroides.  °Last Day of Therapy:  01/26/2019 °Labs weekly while on IV antibiotics: °_X_ CBC with differential °_X_ CMP 01/05/19 01/26/19  Rai, Ripudeep K, MD  °ferrous sulfate 325 (65 FE) MG tablet Take 1 tablet (325 mg total) by mouth 2 (two) times daily with a meal. 01/04/19   Rai, Ripudeep K, MD  °gabapentin (NEURONTIN) 100 MG capsule Take 2 capsules (200 mg total) by mouth 2 (two) times daily. 12/03/18   Thomas, [name] K, NP  °guaiFENesin-dextromethorphan (ROBITUSSIN DM) 100-10 MG/5ML syrup Take 5 mLs by mouth every 4 (four) hours as needed for cough (chest congestion). 01/04/19   Rai, Ripudeep K, MD  °hydrocortisone (ANUSOL-HC) 2.5 % rectal cream Apply 1 application topically 4 (four) times daily as needed for hemorrhoids. 01/04/19   Rai, Ripudeep K, MD  °lidocaine-prilocaine (EMLA) cream Apply 1 application topically as needed. Apply to portacath site 1-2 hours prior to use °Patient taking differently: Apply 1 application topically daily as needed (1-2 hours prior   to portacath access).  08/20/18   Thomas, [name] K, NP  °loperamide (IMODIUM A-D) 2 MG tablet Take 1 tablet (2 mg total) by mouth as directed. °Patient taking differently: Take 2-4 mg by mouth 3 (three) times daily as needed for diarrhea or loose stools.  08/22/18   Sherrill, Gary B, MD  °olmesartan (BENICAR) 40 MG tablet Take 1 tablet (40 mg total) by mouth daily. 04/04/18   Weber, Sarah L, PA-C  °oxyCODONE-acetaminophen (PERCOCET) 10-325 MG tablet Take 1 tablet by mouth every 4 (four) hours as needed for pain. 12/28/18   Thomas, [name] K, NP  °potassium chloride SA (K-DUR) 20 MEQ tablet Take 1 tablet (20 mEq total) by mouth 2 (two) times daily. 12/28/18   Sherrill, Gary B, MD  °PRAMOXINE-ZINC ACETATE EX Apply 1 application topically daily as needed (itching).    [provider]  °prochlorperazine (COMPAZINE) 10 MG tablet Take 1 tablet (10 mg total) by mouth every 6 (six) hours as needed for nausea or vomiting. 01/04/19   Rai, Ripudeep K, MD  °tamsulosin (FLOMAX) 0.4 MG CAPS capsule Take 1 capsule (0.4 mg total) by mouth daily after supper. °Patient not taking: Reported on 01/06/2019 12/27/18   Amin, Ankit Chirag, MD  ° ° °No current facility-administered medications for this encounter.   ° °Current Outpatient Medications  °Medication Sig Dispense Refill  °• amLODipine (NORVASC) 10 MG tablet Take 10 mg by mouth at bedtime.     °• caspofungin (CANCIDAS) IVPB Inject 50 mg into the vein daily. Indication: polymicrobial sepsis, including C. glabrata °Last Day of Therapy: 02/09/2019 °Labs weekly while on IV antibiotics: °_X_ CBC with differential °_X_ CMP 35 Units 0  °• doxazosin (CARDURA) 1 MG tablet Take 1 mg by mouth at bedtime.    °• ertapenem (INVANZ) IVPB Inject 1 g into the vein daily for 21 days. Indication: polymicrobial sepsis with Grp C strep, S. anginosis, bacteroides.  °Last Day of Therapy:  01/26/2019 °Labs weekly while on IV antibiotics: °_X_ CBC with differential °_X_ CMP 21 Units 0  °• ferrous sulfate 325 (65 FE) MG tablet Take 1 tablet (325 mg total) by mouth 2 (two) times daily with a meal. 60 tablet 3  °• gabapentin (NEURONTIN) 100 MG capsule Take 2 capsules (200 mg total) by mouth 2 (two) times daily. 120 capsule 1  °• guaiFENesin-dextromethorphan (ROBITUSSIN DM) 100-10 MG/5ML syrup Take 5 mLs by mouth every 4 (four) hours as needed for cough (chest congestion). 118 mL 0  °• hydrocortisone (ANUSOL-HC) 2.5 % rectal cream Apply 1 application topically 4 (four) times daily as needed for hemorrhoids. 30 g 3  °• lidocaine-prilocaine (EMLA) cream Apply 1 application topically as needed. Apply to portacath site 1-2 hours prior to use (Patient taking differently: Apply 1 application topically daily as needed (1-2 hours prior to portacath access). ) 30 g 2  °• loperamide (IMODIUM A-D) 2  MG tablet Take 1 tablet (2 mg total) by mouth as directed. (Patient taking differently: Take 2-4 mg by mouth 3 (three) times daily as needed for diarrhea or loose stools. ) 30 tablet 0  °• olmesartan (BENICAR) 40 MG tablet Take 1 tablet (40 mg total) by mouth daily. 90 tablet 0  °• oxyCODONE-acetaminophen (PERCOCET) 10-325 MG tablet Take 1 tablet by mouth every 4 (four) hours as needed for pain. 75 tablet 0  °• potassium chloride SA (K-DUR) 20 MEQ tablet Take 1 tablet (20 mEq total) by mouth 2 (two) times daily. 60 tablet 1  °• PRAMOXINE-ZINC ACETATE   ACETATE EX Apply 1 application topically daily as needed (itching).     prochlorperazine (COMPAZINE) 10 MG tablet Take 1 tablet (10 mg total) by mouth every 6 (six) hours as needed for nausea or vomiting. 30 tablet 0   tamsulosin (FLOMAX) 0.4 MG CAPS capsule Take 1 capsule (0.4 mg total) by mouth daily after supper. (Patient not taking: Reported on 01/06/2019) 30 capsule 3    Allergies as of 01/09/2019   (No Known Allergies)    Family History  Problem Relation Age of Onset   Hyperlipidemia Father    Heart disease Father    Dementia Father    Memory loss Mother    Colon cancer Neg Hx    Esophageal cancer Neg Hx    Inflammatory bowel disease Neg Hx    Liver disease Neg Hx    Rectal cancer Neg Hx    Stomach cancer Neg Hx    Pancreatic cancer Neg Hx     Social History   Socioeconomic History   Marital status: Married    Spouse name: Not on file   Number of children: Not on file   Years of education: Not on file   Highest education level: Not on file  Occupational History    Employer: [employer] resource strain: Not on file   Food insecurity    Worry: Not on file    Inability: Not on file   Transportation needs    Medical: Not on file    Non-medical: Not on file  Tobacco Use   Smoking status: Never Smoker   Smokeless tobacco: Never Used  Substance and Sexual Activity   Alcohol use:  No   Drug use: No   Sexual activity: Yes    Partners: Female    Birth control/protection: None  Lifestyle   Physical activity    Days per week: Not on file    Minutes per session: Not on file   Stress: Not on file  Relationships   Social connections    Talks on phone: Not on file    Gets together: Not on file    Attends religious service: Not on file    Active member of club or organization: Not on file    Attends meetings of clubs or organizations: Not on file    Relationship status: Not on file   Intimate partner violence    Fear of current or ex partner: Not on file    Emotionally abused: Not on file    Physically abused: Not on file    Forced sexual activity: Not on file  Other Topics Concern   Not on file  Social History Narrative   Married   2 children, 2 grand children   Coaches womens basketball at [employer]: ROS is O/W negative except as mentioned in HPI.  Physical Exam: Vital signs in last 24 hours: Temp:  [98.6 F (37 C)-99.1 F (37.3 C)] 98.6 F (37 C) (07/08 1440) Pulse Rate:  [82-86] 86 (07/08 1440) Resp:  [17-20] 20 (07/08 1440) BP: (135-136)/(79-98) 136/79 (07/08 1440) SpO2:  [97 %-100 %] 97 % (07/08 1440)   General:  Alert, chronically ill-appearing, pleasant and cooperative in NAD.  Jaundice noted. Head:  Normocephalic and atraumatic. Eyes:  Scleral icterus noted. Ears:  Normal auditory acuity. Mouth:  No deformity or lesions.   Lungs:  Clear throughout to auscultation.  No wheezes, crackles, or rhonchi.  Heart:  Regular rate  rhythm; no murmurs, clicks, rubs, or gallops. °Abdomen:  Soft, somewhat distended.  BS present.  Non-tender. °Msk:  Symmetrical without gross deformities. °Pulses:  Normal pulses noted. °Extremities:  2-3+ pitting edema in B/L LE's. °Neurologic:  Alert and oriented x 4;  grossly normal neurologically. °Skin:  Intact without significant lesions or rashes. °Psych:  Alert and cooperative.  Normal mood and affect. ° °Lab Results: °Recent Labs  °  01/06/19 °2041 01/09/19 °1223  °WBC 4.7 4.1  °HGB 10.1* 10.7*  °HCT 31.3* 33.5*  °PLT 71* 63*  ° °BMET °Recent Labs  °  01/06/19 °2041 01/09/19 °1223  °NA 132* 130*  °K 3.8 4.0  °CL 95* 95*  °CO2 29 27  °GLUCOSE 165* 110*  °BUN 7* 7*  °CREATININE 0.77 0.67  °CALCIUM 8.0* 8.0*  ° °LFT °Recent Labs  °  01/09/19 °1223  °PROT 5.0*  °ALBUMIN 2.1*  °AST 268*  °ALT 175*  °ALKPHOS 1,031*  °BILITOT 8.0*  ° °IMPRESSION:  °#1 62-year-old white male with metastatic pancreatic cancer with multiple hepatic mets, who is admitted with sepsis and apparent biliary obstruction, LFT's up-trending.  Has an uncovered metal stent, which is likely occluded again (it was recently cleaned out with balloon sweeping on 6/4). °#2  Ascites:  Will likely need a therapeutic paracentesis this admission with fluid to be sent for cell count, cytology, culture. ° °PLAN: °-ERCP to clear the stent planned for 7/9 with Dr. Jacobs. °-Continue abx. °-Trend LFT's. °-Await results of CT scan (was ordered by EDP because patient fell onto his side). °-Ok to eat/drink tonight, just NPO after midnight. ° °Jessica D. Zehr  01/09/2019, 3:03 PM ° ° ° °________________________________________________________________________ ° °[facility] MD note: ° °I personally examined the patient, reviewed the data and agree with the assessment and plan described above.  Planning to clean out his biliary stent and then follow LFTs. ° ° °Daniel Jacobs, MD °[facility] °Pager [phone] ° °

## 2019-01-09 NOTE — ED Notes (Signed)
Patient transported to CT 

## 2019-01-09 NOTE — Telephone Encounter (Signed)
"  Deena RN, [facility] [phone]).  Cory Kelly's wife called office asking if he needs to br seen by Dr. Benay Spice.  If you would cal her directly.   1. Reports increased ascites. 2. Increased pain and pain to right side of back.  3. Fell last night in bathroom.  Pain is worse especially after fall in bathroom last night.  No lacerations or bleeding reported.  May have hit his bottom.   4. Increased leg weakness reports she had to get help to get him up after fall.   5. Temp last night = 101.1 and has gone to ED at times because fever spikes off and on.  No further temperatures reported."

## 2019-01-09 NOTE — Consult Note (Addendum)
° °Referring Provider: Dr. Sherril °Primary Care Physician:  Weber, Sarah L, PA-C °Primary Gastroenterologist:  Dr. Jasma Seevers ° °Reason for Consultation:  Biliary obstruction, metastatic pancreatic cancer ° °HPI: Cory Kelly is a 63 y.o. male known to GI was diagnosed with pancreatic cancer in the spring 2019.  He has been managed aggressively, with chemotherapy and underwent SBRT to the pancreas in October 2019.  He has since been found to have hepatic mets.  Course has been complicated by biliary obstruction.  He did have ERCP in February 2020 with removal of 2 previously placed plastic stents from within the lumen of the covered SEMS he was noted to have a stricture just proximal to the previously placed SEMS and a new 8 cm / 10 mm uncovered SEMS was placed. ° °MRI of the abdomen was done on 12/04/2018 which showed multiple liver metastases tiny bilateral effusions, and mild increase in the size of the pancreatic head mass.  There were stigmata of portal venous hypertension with splenomegaly and varices and increased abdominal ascites. °He was noted to have a significant rise in LFTs, and therefore underwent repeat ERCP with Dr. Kittie Krizan on 12/06/2018.  The previous stent was found to be in good position but was filled with debris and possible ingrowth and there was significant narrowing of the lumen of the SCM throughout.  The stent was cleared with balloon sweeping with subsequent cholangiogram showing stent to be patent, with good flow. ° °Patient was again admitted at the end of June and found to have bacteremia with strep and candidiasis.  Was discharged home on July 3 with a PICC line and IV abx, etc.  Also had his Port-A-Cath removed. °  °Was seen at the cancer center again today.  Feels poorly.  Apparently also fell and landed on his side for which the ED physician has ordered a CT scan.  LFTs are up-trending with an AST of 268, ALT 175, alk phos 1031, and total bili 8.0.  Just 3 days ago AST was 226, ALT 94,  alk phos 834, total bili was 3.3.  Abdominal ascites also increasing. °  ° °Past Medical History:  °Diagnosis Date  °• Abdominal pain   ° due to bloating  °• Bloating   °• Cancer (HCC)   ° skin cancer  °• Essential hypertension   °• Fatigue   °• History of kidney stones   ° passed  °• History of weight loss   °• HOH (hard of hearing)   ° no hearing aids  °• Neuralgia   °• Pancreatic cancer (HCC)   °• Stress   ° loss of father in Dec 2018.  ° ° °Past Surgical History:  °Procedure Laterality Date  °• APPENDECTOMY    °• BILIARY STENT PLACEMENT N/A 06/25/2018  ° Procedure: BILIARY STENT PLACEMENT;  Surgeon: Mansouraty, Gabriel Jr., MD;  Location: [facility];  Service: Gastroenterology;  Laterality: N/A;  °• BILIARY STENT PLACEMENT  07/20/2018  ° Procedure: BILIARY STENT PLACEMENT;  Surgeon: Saintclair Schroader P, MD;  Location: [facility];  Service: Gastroenterology;;  °• BILIARY STENT PLACEMENT N/A 08/16/2018  ° Procedure: BILIARY STENT PLACEMENT;  Surgeon: Naif Alabi P, MD;  Location: [facility];  Service: Endoscopy;  Laterality: N/A;  °• ENDOSCOPIC RETROGRADE CHOLANGIOPANCREATOGRAPHY (ERCP) WITH PROPOFOL N/A 08/10/2017  ° Procedure: ENDOSCOPIC RETROGRADE CHOLANGIOPANCREATOGRAPHY (ERCP) WITH PROPOFOL;  Surgeon: Lennis Korb P, MD;  Location: [facility];  Service: Endoscopy;  Laterality: N/A;  °• ENDOSCOPIC RETROGRADE CHOLANGIOPANCREATOGRAPHY (ERCP) WITH PROPOFOL N/A 07/20/2018  ° Procedure: ENDOSCOPIC RETROGRADE   CHOLANGIOPANCREATOGRAPHY (ERCP) WITH PROPOFOL;  Surgeon: Nalanie Winiecki P, MD;  Location: [facility];  Service: Gastroenterology;  Laterality: N/A;  Balloon Sweeps  °• ENDOSCOPIC RETROGRADE CHOLANGIOPANCREATOGRAPHY (ERCP) WITH PROPOFOL N/A 08/16/2018  ° Procedure: ENDOSCOPIC RETROGRADE CHOLANGIOPANCREATOGRAPHY (ERCP) WITH PROPOFOL;  Surgeon: Harrison Zetina P, MD;  Location: [facility];  Service: Endoscopy;  Laterality: N/A;  °• ENDOSCOPIC RETROGRADE CHOLANGIOPANCREATOGRAPHY (ERCP) WITH PROPOFOL N/A  12/06/2018  ° Procedure: ENDOSCOPIC RETROGRADE CHOLANGIOPANCREATOGRAPHY (ERCP) WITH PROPOFOL;  Surgeon: Jalisia Puchalski P, MD;  Location: [facility];  Service: Endoscopy;  Laterality: N/A;  cbd stent sweep  °• ERCP N/A 06/25/2018  ° Procedure: ENDOSCOPIC RETROGRADE CHOLANGIOPANCREATOGRAPHY (ERCP);  Surgeon: Mansouraty, Gabriel Jr., MD;  Location: [facility];  Service: Gastroenterology;  Laterality: N/A;  °• EUS N/A 08/10/2017  ° Procedure: UPPER ENDOSCOPIC ULTRASOUND (EUS) RADIAL;  Surgeon: Jomaira Darr P, MD;  Location: [facility];  Service: Endoscopy;  Laterality: N/A;  °• EYE SURGERY    ° lasik/left eye  °• IR FLUORO GUIDE PORT INSERTION RIGHT  08/30/2017  °• IR REMOVAL TUN ACCESS W/ PORT W/O FL MOD SED  12/27/2018  °• IR US GUIDE VASC ACCESS RIGHT  08/30/2017  °• REMOVAL OF STONES  06/25/2018  ° Procedure: REMOVAL OF STONES;  Surgeon: Mansouraty, Gabriel Jr., MD;  Location: [facility];  Service: Gastroenterology;;  °• SKIN CANCER EXCISION    °• STENT REMOVAL  07/20/2018  ° Procedure: STENT REMOVAL;  Surgeon: Zeph Riebel P, MD;  [facility]: [facility];  Service: Gastroenterology;;  °• STENT REMOVAL  08/16/2018  ° Procedure: STENT REMOVAL;  Surgeon: Tyshae Stair P, MD;  Location: [facility];  Service: Endoscopy;;  °• TEE WITHOUT CARDIOVERSION N/A 11/08/2018  ° Procedure: TRANSESOPHAGEAL ECHOCARDIOGRAM (TEE);  Surgeon: Nahser, Philip J, MD;  Location: [facility];  Service: Cardiovascular;  Laterality: N/A;  ° ° °Prior to Admission medications   °Medication Sig Start Date End Date Taking? Authorizing Provider  °amLODipine (NORVASC) 10 MG tablet Take 10 mg by mouth at bedtime.  05/21/18   [provider]  °caspofungin (CANCIDAS) IVPB Inject 50 mg into the vein daily. Indication: polymicrobial sepsis, including C. glabrata °Last Day of Therapy: 02/09/2019 °Labs weekly while on IV antibiotics: °_X_ CBC with differential °_X_ CMP 01/05/19 02/09/19  Rai, Ripudeep K, MD  °doxazosin (CARDURA) 1 MG tablet Take 1  mg by mouth at bedtime.    [provider]  °ertapenem (INVANZ) IVPB Inject 1 g into the vein daily for 21 days. Indication: polymicrobial sepsis with Grp C strep, S. anginosis, bacteroides.  °Last Day of Therapy:  01/26/2019 °Labs weekly while on IV antibiotics: °_X_ CBC with differential °_X_ CMP 01/05/19 01/26/19  Rai, Ripudeep K, MD  °ferrous sulfate 325 (65 FE) MG tablet Take 1 tablet (325 mg total) by mouth 2 (two) times daily with a meal. 01/04/19   Rai, Ripudeep K, MD  °gabapentin (NEURONTIN) 100 MG capsule Take 2 capsules (200 mg total) by mouth 2 (two) times daily. 12/03/18   Thomas, [name] K, NP  °guaiFENesin-dextromethorphan (ROBITUSSIN DM) 100-10 MG/5ML syrup Take 5 mLs by mouth every 4 (four) hours as needed for cough (chest congestion). 01/04/19   Rai, Ripudeep K, MD  °hydrocortisone (ANUSOL-HC) 2.5 % rectal cream Apply 1 application topically 4 (four) times daily as needed for hemorrhoids. 01/04/19   Rai, Ripudeep K, MD  °lidocaine-prilocaine (EMLA) cream Apply 1 application topically as needed. Apply to portacath site 1-2 hours prior to use °Patient taking differently: Apply 1 application topically daily as needed (1-2 hours prior   to portacath access).  08/20/18   Thomas, [name] K, NP  °loperamide (IMODIUM A-D) 2 MG tablet Take 1 tablet (2 mg total) by mouth as directed. °Patient taking differently: Take 2-4 mg by mouth 3 (three) times daily as needed for diarrhea or loose stools.  08/22/18   Sherrill, Gary B, MD  °olmesartan (BENICAR) 40 MG tablet Take 1 tablet (40 mg total) by mouth daily. 04/04/18   Weber, Sarah L, PA-C  °oxyCODONE-acetaminophen (PERCOCET) 10-325 MG tablet Take 1 tablet by mouth every 4 (four) hours as needed for pain. 12/28/18   Thomas, [name] K, NP  °potassium chloride SA (K-DUR) 20 MEQ tablet Take 1 tablet (20 mEq total) by mouth 2 (two) times daily. 12/28/18   Sherrill, Gary B, MD  °PRAMOXINE-ZINC ACETATE EX Apply 1 application topically daily as needed (itching).    [provider]  °prochlorperazine (COMPAZINE) 10 MG tablet Take 1 tablet (10 mg total) by mouth every 6 (six) hours as needed for nausea or vomiting. 01/04/19   Rai, Ripudeep K, MD  °tamsulosin (FLOMAX) 0.4 MG CAPS capsule Take 1 capsule (0.4 mg total) by mouth daily after supper. °Patient not taking: Reported on 01/06/2019 12/27/18   Amin, Ankit Chirag, MD  ° ° °No current facility-administered medications for this encounter.   ° °Current Outpatient Medications  °Medication Sig Dispense Refill  °• amLODipine (NORVASC) 10 MG tablet Take 10 mg by mouth at bedtime.     °• caspofungin (CANCIDAS) IVPB Inject 50 mg into the vein daily. Indication: polymicrobial sepsis, including C. glabrata °Last Day of Therapy: 02/09/2019 °Labs weekly while on IV antibiotics: °_X_ CBC with differential °_X_ CMP 35 Units 0  °• doxazosin (CARDURA) 1 MG tablet Take 1 mg by mouth at bedtime.    °• ertapenem (INVANZ) IVPB Inject 1 g into the vein daily for 21 days. Indication: polymicrobial sepsis with Grp C strep, S. anginosis, bacteroides.  °Last Day of Therapy:  01/26/2019 °Labs weekly while on IV antibiotics: °_X_ CBC with differential °_X_ CMP 21 Units 0  °• ferrous sulfate 325 (65 FE) MG tablet Take 1 tablet (325 mg total) by mouth 2 (two) times daily with a meal. 60 tablet 3  °• gabapentin (NEURONTIN) 100 MG capsule Take 2 capsules (200 mg total) by mouth 2 (two) times daily. 120 capsule 1  °• guaiFENesin-dextromethorphan (ROBITUSSIN DM) 100-10 MG/5ML syrup Take 5 mLs by mouth every 4 (four) hours as needed for cough (chest congestion). 118 mL 0  °• hydrocortisone (ANUSOL-HC) 2.5 % rectal cream Apply 1 application topically 4 (four) times daily as needed for hemorrhoids. 30 g 3  °• lidocaine-prilocaine (EMLA) cream Apply 1 application topically as needed. Apply to portacath site 1-2 hours prior to use (Patient taking differently: Apply 1 application topically daily as needed (1-2 hours prior to portacath access). ) 30 g 2  °• loperamide (IMODIUM A-D) 2  MG tablet Take 1 tablet (2 mg total) by mouth as directed. (Patient taking differently: Take 2-4 mg by mouth 3 (three) times daily as needed for diarrhea or loose stools. ) 30 tablet 0  °• olmesartan (BENICAR) 40 MG tablet Take 1 tablet (40 mg total) by mouth daily. 90 tablet 0  °• oxyCODONE-acetaminophen (PERCOCET) 10-325 MG tablet Take 1 tablet by mouth every 4 (four) hours as needed for pain. 75 tablet 0  °• potassium chloride SA (K-DUR) 20 MEQ tablet Take 1 tablet (20 mEq total) by mouth 2 (two) times daily. 60 tablet 1  °• PRAMOXINE-ZINC ACETATE   EX Apply 1 application topically daily as needed (itching).    °• prochlorperazine (COMPAZINE) 10 MG tablet Take 1 tablet (10 mg total) by mouth every 6 (six) hours as needed for nausea or vomiting. 30 tablet 0  °• tamsulosin (FLOMAX) 0.4 MG CAPS capsule Take 1 capsule (0.4 mg total) by mouth daily after supper. (Patient not taking: Reported on 01/06/2019) 30 capsule 3  ° ° °Allergies as of 01/09/2019  °• (No Known Allergies)  ° ° °Family History  °Problem Relation Age of Onset  °• Hyperlipidemia Father   °• Heart disease Father   °• Dementia Father   °• Memory loss Mother   °• Colon cancer Neg Hx   °• Esophageal cancer Neg Hx   °• Inflammatory bowel disease Neg Hx   °• Liver disease Neg Hx   °• Rectal cancer Neg Hx   °• Stomach cancer Neg Hx   °• Pancreatic cancer Neg Hx   ° ° °Social History  ° °Socioeconomic History  °• Marital status: Married  °  Spouse name: Not on file  °• Number of children: Not on file  °• Years of education: Not on file  °• Highest education level: Not on file  °Occupational History  °  Employer: [employer]  °Social Needs  °• Financial resource strain: Not on file  °• Food insecurity  °  Worry: Not on file  °  Inability: Not on file  °• Transportation needs  °  Medical: Not on file  °  Non-medical: Not on file  °Tobacco Use  °• Smoking status: Never Smoker  °• Smokeless tobacco: Never Used  °Substance and Sexual Activity  °• Alcohol use:  No  °• Drug use: No  °• Sexual activity: Yes  °  Partners: Female  °  Birth control/protection: None  °Lifestyle  °• Physical activity  °  Days per week: Not on file  °  Minutes per session: Not on file  °• Stress: Not on file  °Relationships  °• Social connections  °  Talks on phone: Not on file  °  Gets together: Not on file  °  Attends religious service: Not on file  °  Active member of club or organization: Not on file  °  Attends meetings of clubs or organizations: Not on file  °  Relationship status: Not on file  °• Intimate partner violence  °  Fear of current or ex partner: Not on file  °  Emotionally abused: Not on file  °  Physically abused: Not on file  °  Forced sexual activity: Not on file  °Other Topics Concern  °• Not on file  °Social History Narrative  ° Married  ° 2 children, 2 grand children  ° Coaches womens basketball at [employer]  ° ° °Review of Systems: °ROS is O/W negative except as mentioned in HPI. ° °Physical Exam: °Vital signs in last 24 hours: °Temp:  [98.6 °F (37 °C)-99.1 °F (37.3 °C)] 98.6 °F (37 °C) (07/08 1440) °Pulse Rate:  [82-86] 86 (07/08 1440) °Resp:  [17-20] 20 (07/08 1440) °BP: (135-136)/(79-98) 136/79 (07/08 1440) °SpO2:  [97 %-100 %] 97 % (07/08 1440) °  °General:  Alert, chronically ill-appearing, pleasant and cooperative in NAD.  Jaundice noted. °Head:  Normocephalic and atraumatic. °Eyes:  Scleral icterus noted. °Ears:  Normal auditory acuity. °Mouth:  No deformity or lesions.   °Lungs:  Clear throughout to auscultation.  No wheezes, crackles, or rhonchi.  °Heart:  Regular rate and   rhythm; no murmurs, clicks, rubs, or gallops. °Abdomen:  Soft, somewhat distended.  BS present.  Non-tender. °Msk:  Symmetrical without gross deformities. °Pulses:  Normal pulses noted. °Extremities:  2-3+ pitting edema in B/L LE's. °Neurologic:  Alert and oriented x 4;  grossly normal neurologically. °Skin:  Intact without significant lesions or rashes. °Psych:  Alert and cooperative.  Normal mood and affect. ° °Lab Results: °Recent Labs  °  01/06/19 °2041 01/09/19 °1223  °WBC 4.7 4.1  °HGB 10.1* 10.7*  °HCT 31.3* 33.5*  °PLT 71* 63*  ° °BMET °Recent Labs  °  01/06/19 °2041 01/09/19 °1223  °NA 132* 130*  °K 3.8 4.0  °CL 95* 95*  °CO2 29 27  °GLUCOSE 165* 110*  °BUN 7* 7*  °CREATININE 0.77 0.67  °CALCIUM 8.0* 8.0*  ° °LFT °Recent Labs  °  01/09/19 °1223  °PROT 5.0*  °ALBUMIN 2.1*  °AST 268*  °ALT 175*  °ALKPHOS 1,031*  °BILITOT 8.0*  ° °IMPRESSION:  °#1 62-year-old white male with metastatic pancreatic cancer with multiple hepatic mets, who is admitted with sepsis and apparent biliary obstruction, LFT's up-trending.  Has an uncovered metal stent, which is likely occluded again (it was recently cleaned out with balloon sweeping on 6/4). °#2  Ascites:  Will likely need a therapeutic paracentesis this admission with fluid to be sent for cell count, cytology, culture. ° °PLAN: °-ERCP to clear the stent planned for 7/9 with Dr. Nicolas Sisler. °-Continue abx. °-Trend LFT's. °-Await results of CT scan (was ordered by EDP because patient fell onto his side). °-Ok to eat/drink tonight, just NPO after midnight. ° °Jessica D. Zehr  01/09/2019, 3:03 PM ° ° ° °________________________________________________________________________ ° °[facility] MD note: ° °I personally examined the patient, reviewed the data and agree with the assessment and plan described above.  Planning to clean out his biliary stent and then follow LFTs. ° ° °Bridger Pizzi, MD °[facility] °Pager [phone] ° °

## 2019-01-09 NOTE — Telephone Encounter (Signed)
No los per 7/8. °

## 2019-01-09 NOTE — Telephone Encounter (Signed)
FYI Received call report from [facility].  "Today's total bilirubin = 8.0, AST = 268."  Called [facility] with results and routing phone note.

## 2019-01-09 NOTE — Progress Notes (Signed)
[facility]pt[facility] Management Clinic Progress Note   Christpoher Sievers [id number] 1955/12/25 63 y.o.  Marcella Dunnaway is managed by Dr. Dominica Severin B. Sherrill  Actively treated with chemotherapy/immunotherapy/hormonal therapy: yes  Current therapy: FOLFIRINOX  Last treated: 12/10/2018 (cycle 7)  Next scheduled appointment with provider: 01/15/2019  Assessment: Plan:    Malignant neoplasm of pancreas, unspecified location of malignancy ([place]) - Plan: US Paracentesis  Malignant ascites - Plan: US Paracentesis  Hyperbilirubinemia  Sepsis, due to unspecified organism, unspecified whether acute organ dysfunction present [facility])   Metastatic malignant neoplasm of the pancreas: The patient is status post cycle 7 of FOLFIRI Geralynn Ochs which was dosed on 12/10/2018 under the care of Dr. Dominica Severin B. Sherrill.  Malignant ascites: The patient has had 21 pound weight increase since 12/24/2018.  His exam today shows ascites.  He had been scheduled for paracentesis but was ultimately taken to the emergency room for evaluation and management.    Sepsis: He continues to have intermittent fevers of at least 101.0 nightly.  He was afebrile today.  He is recently had a right chest wall Port-A-Cath removed and a PICC line placed in the right upper extremity.  Blood cultures x2, urine culture, and urinalysis were collected today along with a CBC, magnesium, and a chemistry panel.  He was recently hospitalized from 12/28/2018 through 01/04/2019 with candidemia and streptococcal bacteremia.  He was discharged on capsofungin 50 mg IV daily till 02/09/2019 and ertapenem IV till 01/26/2019.  Hyperbilirubinemia labs returned today showing a bilirubin of 8.  This was up from 3.9 when checked by home health on 01/07/2019.  Dr. Dominica Severin B. Benay Spice was contacted gastroenterology for further evaluation of the patient.  He has a biliary stent.    Please see After Visit Summary for patient specific instructions.  Future Appointments  Date Time  Provider [place]  01/15/2019  7:30 AM [facility] LAB 3 [facility] None  01/15/2019  8:00 AM [facility] [place] None  01/15/2019  8:15 AM Alla Feeling, NP [facility] None  01/15/2019  9:00 AM [facility] INFUSION [facility] None  01/21/2019 10:30 AM Powers, Evern Core, MD [facility]  01/31/2019  2:45 PM Michel Bickers, MD [facility]    Orders Placed This Encounter  Procedures   US Paracentesis       Subjective:   Patient ID:  Cory Kelly is a 63 y.o. (DOB Jul 03, 1956) male.  Chief Complaint: No chief complaint on file.   HPI 1. Truman Aceituno   is a 63 year old male with a history of a metastatic pancreatic cancer.  He is status post a biliary stent.  He was recently hospitalized from 12/28/2018 through 01/04/2019. He was recently hospitalized his discharge diagnoses included sepsis present on admission, streptococcal bacteremia, candidemia, essential hypertension, hyperlipidemia, metastatic pancreatic cancer, normocytic iron deficiency anemia, peripheral neuropathy, and nonobstructive bilateral kidney stones.  He was discharged home and instructed to continue capsofungin 50 mg IV daily till 02/09/2019 and ertapenem IV till 01/26/2019.  He presents the office today after being seen by home health on 01/07/2019.  At that time his alkaline phosphatase level was elevated at over 900 with his bilirubin returning at 3.9.  He presents to the office today with ongoing intermittent fevers which have been occurring daily and has been up to 101.  He was afebrile at the time of his presentation to the cancer center today.  He has had a 21 pound weight increase since 12/24/2018.  We had planned to have a paracentesis done today however it  was decided that the patient needed to be sent to the ER for evaluation and management.  Is also developed scleral icterus and jaundice.  His labs today returned showing an AST of 268, ALT of 175, alkaline phosphatase of 1031 and a total bilirubin  of 8.  Medications: I have reviewed the patient's current medications.  Allergies: No Known Allergies  Past Medical History:  Diagnosis Date   Abdominal pain    due to bloating   Bloating    Cancer (HCC)    skin cancer   Essential hypertension    Fatigue    History of kidney stones    passed   History of weight loss    HOH (hard of hearing)    no hearing aids   Neuralgia    Pancreatic cancer [facility])    Stress    loss of father in Dec 2018.    Past Surgical History:  Procedure Laterality Date   APPENDECTOMY     BILIARY STENT PLACEMENT N/A 06/25/2018   Procedure: BILIARY STENT PLACEMENT;  Surgeon: Irving Copas., MD;  Location: [facility] ENDOSCOPY;  Service: Gastroenterology;  Laterality: N/A;   BILIARY STENT PLACEMENT  07/20/2018   Procedure: BILIARY STENT PLACEMENT;  Surgeon: Milus Banister, MD;  Location: [facility] ENDOSCOPY;  Service: Gastroenterology;;   BILIARY STENT PLACEMENT N/A 08/16/2018   Procedure: BILIARY STENT PLACEMENT;  Surgeon: Milus Banister, MD;  Location: [facility] ENDOSCOPY;  Service: Endoscopy;  Laterality: N/A;   ENDOSCOPIC RETROGRADE CHOLANGIOPANCREATOGRAPHY (ERCP) WITH PROPOFOL N/A 08/10/2017   Procedure: ENDOSCOPIC RETROGRADE CHOLANGIOPANCREATOGRAPHY (ERCP) WITH PROPOFOL;  Surgeon: Milus Banister, MD;  Location: [facility] ENDOSCOPY;  Service: Endoscopy;  Laterality: N/A;   ENDOSCOPIC RETROGRADE CHOLANGIOPANCREATOGRAPHY (ERCP) WITH PROPOFOL N/A 07/20/2018   Procedure: ENDOSCOPIC RETROGRADE CHOLANGIOPANCREATOGRAPHY (ERCP) WITH PROPOFOL;  Surgeon: Milus Banister, MD;  Location: [facility] ENDOSCOPY;  Service: Gastroenterology;  Laterality: N/A;  Balloon Sweeps   ENDOSCOPIC RETROGRADE CHOLANGIOPANCREATOGRAPHY (ERCP) WITH PROPOFOL N/A 08/16/2018   Procedure: ENDOSCOPIC RETROGRADE CHOLANGIOPANCREATOGRAPHY (ERCP) WITH PROPOFOL;  Surgeon: Milus Banister, MD;  Location: [facility] ENDOSCOPY;  Service: Endoscopy;  Laterality: N/A;   ENDOSCOPIC RETROGRADE  CHOLANGIOPANCREATOGRAPHY (ERCP) WITH PROPOFOL N/A 12/06/2018   Procedure: ENDOSCOPIC RETROGRADE CHOLANGIOPANCREATOGRAPHY (ERCP) WITH PROPOFOL;  Surgeon: Milus Banister, MD;  Location: [facility] ENDOSCOPY;  Service: Endoscopy;  Laterality: N/A;  cbd stent sweep   ERCP N/A 06/25/2018   Procedure: ENDOSCOPIC RETROGRADE CHOLANGIOPANCREATOGRAPHY (ERCP);  Surgeon: Irving Copas., MD;  Location: [facility] END[facility]COPY;  Service: Gastroenterology;  Laterality: N/A;   EUS N/A 08/10/2017   Procedure: UPPER ENDOSCOPIC ULTRASOUND (EUS) RADIAL;  Surgeon: Milus Banister, MD;  Location: [facility] ENDOSCOPY;  Service: Endoscopy;  Laterality: N/A;   EYE SURGERY     lasik/left eye   IR FLUORO GUIDE PORT INSERTION RIGHT  08/30/2017   IR REMOVAL TUN ACCESS W/ PORT W/O FL MOD SED  12/27/2018   IR US GUIDE VASC ACCESS RIGHT  08/30/2017   REMOVAL OF STONES  06/25/2018   Procedure: REMOVAL OF STONES;  Surgeon: Irving Copas., MD;  Location: [facility] ENDOSCOPY;  Service: Gastroenterology;;   SKIN CANCER EXCISION     STENT REMOVAL  07/20/2018   Procedure: STENT REMOVAL;  Surgeon: Milus Banister, MD;  Location: [facility] ENDOSCOPY;  Service: Gastroenterology;;   Lavell Islam REMOVAL  08/16/2018   Procedure: STENT REMOVAL;  Surgeon: Milus Banister, MD;  Location: [facility] ENDOSCOPY;  Service: Endoscopy;;   TEE WITHOUT CARDIOVERSION N/A 11/08/2018   Procedure: TRANSESOPHAGEAL ECHOCARDIOGRAM (TEE);  Surgeon: Thayer Headings, MD;  Location: [facility] ENDOSCOPY;  Service: Cardiovascular;  Laterality: N/A;    Family History  Problem Relation Age of Onset   Hyperlipidemia Father    Heart disease Father    Dementia Father    Memory loss Mother    Colon cancer Neg Hx    Esophageal cancer Neg Hx    Inflammatory bowel disease Neg Hx    Liver disease Neg Hx    Rectal cancer Neg Hx    Stomach cancer Neg Hx    Pancreatic cancer Neg Hx     Social History   Socioeconomic History   Marital status: Married    Spouse name: Not on file    Number of children: Not on file   Years of education: Not on file   Highest education level: Not on file  Occupational History    Employer: [employer] resource strain: Not on file   Food insecurity    Worry: Not on file    Inability: Not on file   Transportation needs    Medical: Not on file    Non-medical: Not on file  Tobacco Use   Smoking status: Never Smoker   Smokeless tobacco: Never Used  Substance and Sexual Activity   Alcohol use: No   Drug use: No   Sexual activity: Yes    Partners: Female    Birth control/protection: None  Lifestyle   Physical activity    Days per week: Not on file    Minutes per session: Not on file   Stress: Not on file  Relationships   Social connections    Talks on phone: Not on file    Gets together: Not on file    Attends religious service: Not on file    Active member of club or organization: Not on file    Attends meetings of clubs or organizations: Not on file    Relationship status: Not on file   Intimate partner violence    Fear of current or ex partner: Not on file    Emotionally abused: Not on file    Physically abused: Not on file    Forced sexual activity: Not on file  Other Topics Concern   Not on file  Social History Narrative   Married   2 children, 2 grand children   Coaches womens basketball at [employer]    Past Medical History, Surgical history, Social history, and Family history were reviewed and updated as appropriate.   Please see review of systems for further details on the patient's review from today.   Review of Systems:  Review of Systems  Constitutional: Positive for activity change, appetite change and fever. Negative for chills and diaphoresis.  HENT: Negative for trouble swallowing.   Respiratory: Negative for cough, chest tightness and shortness of breath.   Cardiovascular: Negative for chest pain, palpitations and leg swelling.    Gastrointestinal: Positive for abdominal distention. Negative for abdominal pain, constipation, diarrhea, nausea and vomiting.  Genitourinary: Negative for difficulty urinating.  Skin: Positive for color change.  Neurological: Positive for weakness.    Objective:   Physical Exam:  BP (!) 135/98 (BP Location: Left Arm, Patient Position: Sitting)    Pulse 82    Temp 99.1 F (37.3 C) (Oral)    Resp 17    SpO2 100%  ECOG: 2  Physical Exam Constitutional:      General: He is not in acute distress.    Appearance: He is ill-appearing. He  is not diaphoretic.  HENT:     Head: Normocephalic and atraumatic.  Eyes:     General: Scleral icterus present.  Cardiovascular:     Rate and Rhythm: Normal rate and regular rhythm.     Heart sounds: Normal heart sounds. No murmur. No friction rub. No gallop.   Pulmonary:     Effort: Pulmonary effort is normal. No respiratory distress.     Breath sounds: Normal breath sounds. No wheezing or rales.  Abdominal:     General: Bowel sounds are decreased. There is distension.  Skin:    General: Skin is warm and dry.     Coloration: Skin is jaundiced.     Findings: No erythema or rash.     Comments: There is a PICC line present in the right medial upper extremity.  There is bruising at the site. The patient has scattered petechiae over his bilateral upper extremities.  Neurological:     Mental Status: He is alert.     Gait: Gait abnormal (The patient is ambulating with the use of a wheelchair.).     Lab Review:     Component Value Date/Time   NA 130 (L) 01/09/2019 1223   K 4.0 01/09/2019 1223   CL 95 (L) 01/09/2019 1223   CO2 27 01/09/2019 1223   GLUCOSE 110 (H) 01/09/2019 1223   BUN 7 (L) 01/09/2019 1223   CREATININE 0.67 01/09/2019 1223   CREATININE 1.27 10/27/2015 1600   CALCIUM 8.0 (L) 01/09/2019 1223   PROT 5.0 (L) 01/09/2019 1223   ALBUMIN 2.1 (L) 01/09/2019 1223   AST 268 (HH) 01/09/2019 1223   ALT 175 (H) 01/09/2019 1223   ALKPHOS  1,031 (H) 01/09/2019 1223   BILITOT 8.0 (HH) 01/09/2019 1223   GFRNONAA >60 01/09/2019 1223   GFRNONAA 62 10/27/2015 1600   GFRAA >60 01/09/2019 1223   GFRAA 72 10/27/2015 1600       Component Value Date/Time   WBC 4.1 01/09/2019 1223   WBC 4.7 01/06/2019 2041   RBC 3.81 (L) 01/09/2019 1223   HGB 10.7 (L) 01/09/2019 1223   HCT 33.5 (L) 01/09/2019 1223   HCT REJ5 12/28/2018 2334   PLT 63 (L) 01/09/2019 1223   MCV 87.9 01/09/2019 1223   MCV 91.1 11/12/2012 1655   MCH 28.1 01/09/2019 1223   MCHC 31.9 01/09/2019 1223   RDW 15.4 01/09/2019 1223   LYMPHSABS 0.2 (L) 01/09/2019 1223   MONOABS 0.3 01/09/2019 1223   EOSABS 0.0 01/09/2019 1223   BASOSABS 0.0 01/09/2019 1223   -------------------------------  Imaging from last 24 hours (if applicable):  Radiology interpretation: Dg Chest 2 View  Result Date: 12/28/2018 CLINICAL DATA:  Sepsis EXAM: CHEST - 2 VIEW COMPARISON:  December 24, 2018 FINDINGS: The right-sided Port-A-Cath has been removed. There is a new right-sided PICC line with tip terminating over the distal SVC. The cardiac size is stable. There is no pneumothorax. No large pleural effusion. The lung volumes are slightly low. IMPRESSION: No active cardiopulmonary disease. Electronically Signed   By: Constance Holster M.D.   On: 12/28/2018 18:53   Ct Abdomen Pelvis W Contrast  Result Date: 12/24/2018 CLINICAL DATA:  Abdominal distension, fever, history of pancreatic cancer EXAM: CT ABDOMEN AND PELVIS WITH CONTRAST TECHNIQUE: Multidetector CT imaging of the abdomen and pelvis was performed using the standard protocol following bolus administration of intravenous contrast. CONTRAST:  161mL OMNIPAQUE IOHEXOL 300 MG/ML  SOLN COMPARISON:  MR abdomen pelvis, 12/04/2018, 10/30/2018 FINDINGS: Lower chest: No acute abnormality.  Hepatobiliary: Known liver metastases are poorly appreciated by single-phase CT, for example in the dome of the liver (series 2, image 14). No gallstones or  gallbladder wall thickening. Common bile duct stent is positioned tip in the descending portion of the duodenum with post stenting pneumobilia. Pancreas: Discrete pancreatic head mass is not well appreciated, primarily appreciated by mass effect on the confluence of the portal vein (series 2, image 38. There is atrophy of the pancreatic body and tail and dilatation pancreatic duct. Spleen: Splenomegaly. Adrenals/Urinary Tract: Adrenal glands are unremarkable. Nonobstructive bilateral nephrolithiasis. Bladder is unremarkable. Stomach/Bowel: Stomach is within normal limits. Appendix is not clearly visualized. No evidence of bowel wall thickening, distention, or inflammatory changes. Large burden of stool in the distal colon and rectum. Vascular/Lymphatic: Aortic atherosclerosis. No enlarged abdominal or pelvic lymph nodes. Reproductive: No mass or other significant abnormality. Other: No abdominal wall hernia or abnormality. Moderate volume ascites. Musculoskeletal: No acute or significant osseous findings. IMPRESSION: 1. Redemonstrated pancreatic malignancy with hepatic metastases, status post common bile duct stenting. Primary mass and hepatic metastases are better appreciated by recent multiphasic MRI. 2.  Moderate volume ascites, which is similar to prior MRI. 3.  Splenomegaly. 4.  Large burden of stool in the distal colon and rectum. 5.  Nonobstructive bilateral nephrolithiasis. Electronically Signed   By: Eddie Candle M.D.   On: 12/24/2018 14:35   US Paracentesis  Result Date: 12/31/2018 INDICATION: Patient with history of metastatic pancreatic cancer, ascites. Request made for diagnostic and therapeutic paracentesis. EXAM: ULTRASOUND GUIDED DIAGNOSTIC AND THERAPEUTIC PARACENTESIS MEDICATIONS: None COMPLICATIONS: None immediate. PROCEDURE: Informed written consent was obtained from the patient after a discussion of the risks, benefits and alternatives to treatment. A timeout was performed prior to the  initiation of the procedure. Initial ultrasound scanning demonstrates a small to moderate amount of ascites within the right upper to mid abdominal quadrant. The right upper to mid abdomen was prepped and draped in the usual sterile fashion. 1% lidocaine was used for local anesthesia. Following this, a 19 gauge, 7-cm, Yueh catheter was introduced. An ultrasound image was saved for documentation purposes. The paracentesis was performed. The catheter was removed and a dressing was applied. The patient tolerated the procedure well without immediate post procedural complication. FINDINGS: A total of approximately 2.6 liters of slightly hazy, light yellow fluid was removed. Samples were sent to the laboratory as requested by the clinical team. IMPRESSION: Successful ultrasound-guided diagnostic and therapeutic paracentesis yielding 2.6 liters of peritoneal fluid. Read by: Rowe Robert, PA-C Electronically Signed   By: Sandi Mariscal M.D.   On: 12/31/2018 12:04   Ir Removal [employer] W/ [place] W/o Fl Mod Sed  Result Date: 12/27/2018 CLINICAL DATA:  Bacteremia. Indwelling right IJ port catheter placed 08/30/2017 by Dr. Pascal Lux, has been working well without evident complication , removal requested EXAM: EXAM TUNNELED PORT CATHETER REMOVAL TECHNIQUE: The procedure, risks (including but not limited to bleeding, infection, organ damage ), benefits, and alternatives were explained to the patient. Questions regarding the procedure were encouraged and answered. The patient understands and consents to the procedure. Intravenous Fentanyl 179mcg and Versed 4mg  were administered as conscious sedation during continuous monitoring of the patient's level of consciousness and physiological / cardiorespiratory status by the radiology RN, with a total moderate sedation time of 37 minutes. Overlying skin prepped with chlorhexidine, draped in usual sterile fashion, infiltrated locally with 1% lidocaine. A small incision was made over the scar  from previous placement. The port catheter was dissected free  from the underlying soft tissues and removed intact. There is no gross purulence. Hemostasis was achieved. The port pocket was closed with deep interrupted and subcuticular continuous 3-0 Monocryl sutures, then covered with Dermabond. The patient tolerated the procedure well. COMPLICATIONS: COMPLICATIONS None immediate IMPRESSION: 1.  Technically successful tunneled Port catheter removal. Electronically Signed   By: Lucrezia Europe M.D.   On: 12/27/2018 16:14   Dg Chest Portable 1 View  Result Date: 01/06/2019 CLINICAL DATA:  Patient with fever EXAM: PORTABLE CHEST 1 VIEW COMPARISON:  Chest radiograph 12/28/2018 FINDINGS: Right upper extremity PICC line tip projects over the superior vena cava. Monitoring leads overlie the patient. Stable cardiac and mediastinal contours. Elevation of the right hemidiaphragm. Retrocardiac consolidative opacity. No pleural effusion or pneumothorax. IMPRESSION: Retrocardiac consolidative opacity which may represent atelectasis or pneumonia. Electronically Signed   By: Lovey Newcomer M.D.   On: 01/06/2019 21:02   Dg Chest Port 1 View  Result Date: 12/28/2018 CLINICAL DATA:  Sepsis hypertension EXAM: PORTABLE CHEST 1 VIEW COMPARISON:  12/28/2018, 12/24/2018 FINDINGS: Right upper extremity catheter tip overlies the SVC. No acute consolidation or effusion. Normal cardiomediastinal silhouette. No pneumothorax. IMPRESSION: No active disease. Electronically Signed   By: Donavan Foil M.D.   On: 12/28/2018 23:03   Dg Chest Portable 1 View  Result Date: 12/24/2018 CLINICAL DATA:  Fever and weakness. EXAM: PORTABLE CHEST 1 VIEW COMPARISON:  11/04/2018 FINDINGS: The cardiac silhouette, mediastinal and hilar contours are stable. Mild cardiac enlargement and mild tortuosity of the thoracic aorta. The right IJ power port is stable. The lungs are clear. No pleural effusions. The bony thorax is intact. IMPRESSION: No acute  cardiopulmonary findings. Electronically Signed   By: Marijo Sanes M.D.   On: 12/24/2018 13:59   [place] Ascites (abdomen Limited)  Result Date: 12/25/2018 CLINICAL DATA:  63 year old male with abdominal distension, ascites EXAM: LIMITED ABDOMEN ULTRASOUND FOR ASCITES TECHNIQUE: Limited ultrasound survey for ascites was performed in all four abdominal quadrants. COMPARISON:  Prior CT scan of the abdomen and pelvis 12/24/2018 FINDINGS: Sonographic interrogation was performed of the 4 quadrants of the abdomen. There is trace perihepatic fluid in the right upper quadrant with multiple small loops of bowel. Overall, fluid volume is low. No safe window for paracentesis. IMPRESSION: Small volume ascites, insufficient for paracentesis. Electronically Signed   By: Jacqulynn Cadet M.D.   On: 12/25/2018 12:13   Ir Picc Placement Right >5 Yrs Inc Img Guide  Result Date: 12/27/2018 CLINICAL DATA:  Bacteremia, needs venous access for planned chemotherapy Monday EXAM: PICC PLACEMENT WITH ULTRASOUND AND FLUOROSCOPY FLUOROSCOPY TIME:  6 seconds, 1 mGy TECHNIQUE: After written informed consent was obtained, patient was placed in the supine position on angiographic table. Patency of the right basilic vein was confirmed with ultrasound with image documentation. An appropriate skin site was determined. Skin site was marked. Region was prepped using maximum barrier technique including cap and mask, sterile gown, sterile gloves, large sterile sheet, and Chlorhexidine as cutaneous antisepsis. The region was infiltrated locally with 1% lidocaine. Under real-time ultrasound guidance, the right basilic vein was accessed with a 21 gauge micropuncture needle; the needle tip within the vein was confirmed with ultrasound image documentation. Needle exchanged over a 018 guidewire for a peel-away sheath, through which a 5-French single-lumen power injectable PICC trimmed to 46cm was advanced, positioned with its tip near the cavoatrial  junction. Spot chest radiograph confirms appropriate catheter position. Catheter was flushed per protocol and secured externally. The patient tolerated procedure well. COMPLICATIONS:  COMPLICATIONS none IMPRESSION: 1. Technically successful five [place] single lumen power injectable PICC placement Electronically Signed   By: Lucrezia Europe M.D.   On: 12/27/2018 16:15   [place] Ekg Site Rite  Result Date: 01/04/2019 If Site Rite image not attached, placement could not be confirmed due to current cardiac rhythm.       This patient was seen with Dr. Benay Spice with my treatment plan reviewed with him. He expressed agreement with my medical management of this patient. This was a shared visit with Sandi Mealy Mr. Larke was interviewed and examined.  He has a history of metastatic pancreas cancer, biliary obstruction, and recurrent bacteremia.  He was diagnosed with Candida fungemia during a recent hospital admission.  The source for his infections have been unclear with tumor involving the GI tract and biliary obstruction most likely.  His Port-A-Cath was removed on 12/27/2018.  He presents today with failure to thrive, progressive ascites, and recurrent fever.  The liver enzymes and bilirubin are markedly elevated.  He will be admitted for further evaluation.  I contacted Dr. Ardis Hughs.  The GI service will see him in the hospital.  He has been followed by infectious disease over the past several weeks.  I recommend re-contacting the infectious disease service.  I updated his wife by telephone.  She understands the poor prognosis.  I will begin discussions regarding goals of care during this admission.  Recommendations: 1.  Admit for evaluation and management of recurrent bacteremia and biliary obstruction 2.  Diagnostic/therapeutic paracentesis to include fluid for culture and cytology 3.  Consult gastroenterology and infectious disease 4.  Oncology will see Mr. Kondracki on 01/10/2019.  Julieanne Manson, MD

## 2019-01-09 NOTE — H&P (Signed)
.  History and Physical    Cory Kelly BZJ:[id number] DOB: 1955-07-23 DOA: 01/09/2019  PCP: Mancel Bale, PA-C  Patient coming from: Home   Chief Complaint: "I've been falling and losing a lot of weight... and they say my numbers are high."  HPI: Cory Kelly is a 63 y.o. male with medical history significant of HTN, pancreatic cancer, bacteremia. He presents today w/ complaints of generalized weakness and falls. He reports multiple falls over the last several months. They generally do not involve chest pain, palpitations, dizziness, LOC. He reports that his most recent fall was 2 days ago, but this was due to tripping over his dog. He says that he generally feels unsteady on his feet.   He was following up with his oncology team today (history of pancreatic cancer) and was found to have elevated LFTs/bilirubin. They became concerned and spoke with GI. He was sent to the ED and evaluated by the GI team. They believe he may have a problem with his biliary stent and take him for ERCP in the morning.  Of note, he has had multiple recent admissions. He was recently diagnosed w/ strep bacteremia and candidemia. He was placed on long term IV abx.  Review of Systems: Denies CP, palpitations, dyspnea, N, V, HA, dizziness, lightheadedness, diarrhea. Remainder of 10 point review of systems is otherwise negative for all not mentioned in HPI.    Past Medical History:  Diagnosis Date   Abdominal pain    due to bloating   Bloating    Cancer (HCC)    skin cancer   Essential hypertension    Fatigue    History of kidney stones    passed   History of weight loss    HOH (hard of hearing)    no hearing aids   Neuralgia    Pancreatic cancer [facility])    Stress    loss of father in Dec 2018.    Past Surgical History:  Procedure Laterality Date   APPENDECTOMY     BILIARY STENT PLACEMENT N/A 06/25/2018   Procedure: BILIARY STENT PLACEMENT;  Surgeon: Irving Copas., MD;   Location: [facility] ENDOSCOPY;  Service: Gastroenterology;  Laterality: N/A;   BILIARY STENT PLACEMENT  07/20/2018   Procedure: BILIARY STENT PLACEMENT;  Surgeon: Milus Banister, MD;  Location: [facility] ENDOSCOPY;  Service: Gastroenterology;;   BILIARY STENT PLACEMENT N/A 08/16/2018   Procedure: BILIARY STENT PLACEMENT;  Surgeon: Milus Banister, MD;  Location: [facility] ENDOSCOPY;  Service: Endoscopy;  Laterality: N/A;   ENDOSCOPIC RETROGRADE CHOLANGIOPANCREATOGRAPHY (ERCP) WITH PROPOFOL N/A 08/10/2017   Procedure: ENDOSCOPIC RETROGRADE CHOLANGIOPANCREATOGRAPHY (ERCP) WITH PROPOFOL;  Surgeon: Milus Banister, MD;  Location: [facility] ENDOSCOPY;  Service: Endoscopy;  Laterality: N/A;   ENDOSCOPIC RETROGRADE CHOLANGIOPANCREATOGRAPHY (ERCP) WITH PROPOFOL N/A 07/20/2018   Procedure: ENDOSCOPIC RETROGRADE CHOLANGIOPANCREATOGRAPHY (ERCP) WITH PROPOFOL;  Surgeon: Milus Banister, MD;  Location: [facility] ENDOSCOPY;  Service: Gastroenterology;  Laterality: N/A;  Balloon Sweeps   ENDOSCOPIC RETROGRADE CHOLANGIOPANCREATOGRAPHY (ERCP) WITH PROPOFOL N/A 08/16/2018   Procedure: ENDOSCOPIC RETROGRADE CHOLANGIOPANCREATOGRAPHY (ERCP) WITH PROPOFOL;  Surgeon: Milus Banister, MD;  Location: [facility] ENDOSCOPY;  Service: Endoscopy;  Laterality: N/A;   ENDOSCOPIC RETROGRADE CHOLANGIOPANCREATOGRAPHY (ERCP) WITH PROPOFOL N/A 12/06/2018   Procedure: ENDOSCOPIC RETROGRADE CHOLANGIOPANCREATOGRAPHY (ERCP) WITH PROPOFOL;  Surgeon: Milus Banister, MD;  Location: [facility] ENDOSCOPY;  Service: Endoscopy;  Laterality: N/A;  cbd stent sweep   ERCP N/A 06/25/2018   Procedure: ENDOSCOPIC RETROGRADE CHOLANGIOPANCREATOGRAPHY (ERCP);  Surgeon: Rush Landmark Telford Nab., MD;  Location: [facility] ENDOSCOPY;  Service: Gastroenterology;  Laterality: N/A;   EUS N/A 08/10/2017   Procedure: UPPER ENDOSCOPIC ULTRASOUND (EUS) RADIAL;  Surgeon: Milus Banister, MD;  Location: [facility] ENDOSCOPY;  Service: Endoscopy;  Laterality: N/A;   EYE SURGERY     lasik/left eye   IR FLUORO GUIDE PORT  INSERTION RIGHT  08/30/2017   IR REMOVAL TUN ACCESS W/ PORT W/O FL MOD SED  12/27/2018   IR US GUIDE VASC ACCESS RIGHT  08/30/2017   REMOVAL OF STONES  06/25/2018   Procedure: REMOVAL OF STONES;  Surgeon: Irving Copas., MD;  Location: [facility];  Service: Gastroenterology;;   SKIN CANCER EXCISION     STENT REMOVAL  07/20/2018   Procedure: STENT REMOVAL;  Surgeon: Milus Banister, MD;  Location: [facility] ENDOSCOPY;  Service: Gastroenterology;;   Lavell Islam REMOVAL  08/16/2018   Procedure: STENT REMOVAL;  Surgeon: Milus Banister, MD;  Location: [facility] ENDOSCOPY;  Service: Endoscopy;;   TEE WITHOUT CARDIOVERSION N/A 11/08/2018   Procedure: TRANSESOPHAGEAL ECHOCARDIOGRAM (TEE);  Surgeon: Acie Fredrickson Wonda Cheng, MD;  Location: [facility] ENDOSCOPY;  Service: Cardiovascular;  Laterality: N/A;     reports that he has never smoked. He has never used smokeless tobacco. He reports that he does not drink alcohol or use drugs.  No Known Allergies  Family History  Problem Relation Age of Onset   Hyperlipidemia Father    Heart disease Father    Dementia Father    Memory loss Mother    Colon cancer Neg Hx    Esophageal cancer Neg Hx    Inflammatory bowel disease Neg Hx    Liver disease Neg Hx    Rectal cancer Neg Hx    Stomach cancer Neg Hx    Pancreatic cancer Neg Hx     Prior to Admission medications   Medication Sig Start Date End Date Taking? Authorizing Provider  amLODipine (NORVASC) 10 MG tablet Take 10 mg by mouth at bedtime.  05/21/18  Yes [provider]  caspofungin (CANCIDAS) IVPB Inject 50 mg into the vein daily. Indication: polymicrobial sepsis, including C. glabrata Last Day of Therapy: 02/09/2019 Labs weekly while on IV antibiotics: _X_ CBC with differential _X_ CMP 01/05/19 02/09/19 Yes Rai, Ripudeep K, MD  doxazosin (CARDURA) 1 MG tablet Take 1 mg by mouth at bedtime.   Yes [provider]  ertapenem (INVANZ) IVPB Inject 1 g into the vein daily for 21 days.  Indication: polymicrobial sepsis with Grp C strep, S. anginosis, bacteroides.  Last Day of Therapy:  01/26/2019 Labs weekly while on IV antibiotics: _X_ CBC with differential _X_ CMP 01/05/19 01/26/19 Yes Rai, Ripudeep K, MD  ferrous sulfate 325 (65 FE) MG tablet Take 1 tablet (325 mg total) by mouth 2 (two) times daily with a meal. 01/04/19  Yes Rai, Ripudeep K, MD  gabapentin (NEURONTIN) 100 MG capsule Take 2 capsules (200 mg total) by mouth 2 (two) times daily. 12/03/18  Yes Owens Shark, NP  lidocaine-prilocaine (EMLA) cream Apply 1 application topically as needed. Apply to portacath site 1-2 hours prior to use Patient taking differently: Apply 1 application topically daily as needed (1-2 hours prior to portacath access).  08/20/18  Yes Owens Shark, NP  loperamide (IMODIUM A-D) 2 MG tablet Take 1 tablet (2 mg total) by mouth as directed. Patient taking differently: Take 2-4 mg by mouth 3 (three) times daily as needed for diarrhea or loose stools.  08/22/18  Yes Ladell Pier, MD  olmesartan (BENICAR) 40 MG tablet Take 1  tablet (40 mg total) by mouth daily. 04/04/18  Yes Weber, Damaris Hippo, PA-C  oxyCODONE-acetaminophen (PERCOCET) 10-325 MG tablet Take 1 tablet by mouth every 4 (four) hours as needed for pain. 12/28/18  Yes Owens Shark, NP  potassium chloride SA (K-DUR) 20 MEQ tablet Take 1 tablet (20 mEq total) by mouth 2 (two) times daily. 12/28/18  Yes Ladell Pier, MD  [facility] ACETATE EX Apply 1 application topically daily as needed (itching).   Yes [provider]  Probiotic Product (ALIGN) 4 MG CAPS Take 1 capsule by mouth daily.   Yes [provider]  guaiFENesin-dextromethorphan (ROBITUSSIN DM) 100-10 MG/5ML syrup Take 5 mLs by mouth every 4 (four) hours as needed for cough (chest congestion). 01/04/19   Rai, Vernelle Emerald, MD  hydrocortisone (ANUSOL-HC) 2.5 % rectal cream Apply 1 application topically 4 (four) times daily as needed for hemorrhoids. 01/04/19   Rai,  Vernelle Emerald, MD  prochlorperazine (COMPAZINE) 10 MG tablet Take 1 tablet (10 mg total) by mouth every 6 (six) hours as needed for nausea or vomiting. 01/04/19   Rai, Vernelle Emerald, MD  tamsulosin (FLOMAX) 0.4 MG CAPS capsule Take 1 capsule (0.4 mg total) by mouth daily after supper. Patient not taking: Reported on 01/06/2019 12/27/18   Damita Lack, MD    Physical Exam: Vitals:   01/09/19 1440 01/09/19 1539  BP: 136/79 132/84  Pulse: 86 87  Resp: 20 15  Temp: 98.6 F (37 C)   TempSrc: Oral   SpO2: 97% 97%    Constitutional: 63 y.o. male NAD, calm, comfortable Vitals:   01/09/19 1440 01/09/19 1539  BP: 136/79 132/84  Pulse: 86 87  Resp: 20 15  Temp: 98.6 F (37 C)   TempSrc: Oral   SpO2: 97% 97%   General: 63 y.o. male resting in bed in NAD HEENT: Crisp/AT, PERRL, jaundiced sclera, nares patent w/o discharge, oropharynx clear, neck supple Cardiovascular: RRR, +S1, S2, no m/g/r, equal pulses throughout Respiratory: CTABL, no w/r/r, normal WOB GI: BS+, NT, slight distention, no masses noted, no organomegaly noted MSK: No e/c/c Skin: No rashes, ulcerations noted; multiple bruises noted on b/l UE, he is jaundiced Neuro: A&O x 3, no focal deficits Psyc: Appropriate interaction and affect, calm/cooperative  Labs on Admission: I have personally reviewed following labs and imaging studies  CBC: Recent Labs  Lab 01/03/19 0616 01/06/19 2041 01/09/19 1223  WBC 4.2 4.7 4.1  NEUTROABS  --  4.0 3.6  HGB 10.1* 10.1* 10.7*  HCT 31.9* 31.3* 33.5*  MCV 88.6 87.7 87.9  PLT 129* 71* 63*   Basic Metabolic Panel: Recent Labs  Lab 01/03/19 0616 01/04/19 0505 01/06/19 2041 01/09/19 1223  NA  --   --  132* 130*  K  --   --  3.8 4.0  CL  --   --  95* 95*  CO2  --   --  29 27  GLUCOSE  --   --  165* 110*  BUN  --   --  7* 7*  CREATININE 0.59* 0.61 0.77 0.67  CALCIUM  --   --  8.0* 8.0*  MG 1.9  --   --  1.6*   GFR: Estimated Creatinine Clearance: 121.5 mL/min (by C-G formula  based on SCr of 0.67 mg/dL). Liver Function Tests: Recent Labs  Lab 01/06/19 2041 01/09/19 1223  AST 226* 268*  ALT 94* 175*  ALKPHOS 834* 1,031*  BILITOT 3.3* 8.0*  PROT 5.1* 5.0*  ALBUMIN 2.5* 2.1*  No results for input(s): LIPASE, AMYLASE in the last 168 hours. No results for input(s): AMMONIA in the last 168 hours. Coagulation Profile: No results for input(s): INR, PROTIME in the last 168 hours. Cardiac Enzymes: No results for input(s): CKTOTAL, CKMB, CKMBINDEX, TROPONINI in the last 168 hours. BNP (last 3 results) No results for input(s): PROBNP in the last 8760 hours. HbA1C: No results for input(s): HGBA1C in the last 72 hours. CBG: No results for input(s): GLUCAP in the last 168 hours. Lipid Profile: No results for input(s): CHOL, HDL, LDLCALC, TRIG, CHOLHDL, LDLDIRECT in the last 72 hours. Thyroid Function Tests: No results for input(s): TSH, T4TOTAL, FREET4, T3FREE, THYROIDAB in the last 72 hours. Anemia Panel: No results for input(s): VITAMINB12, FOLATE, FERRITIN, TIBC, IRON, RETICCTPCT in the last 72 hours. Urine analysis:    Component Value Date/Time   COLORURINE AMBER (A) 01/06/2019 2041   APPEARANCEUR CLEAR 01/06/2019 2041   LABSPEC 1.016 01/06/2019 2041   PHURINE 5.0 01/06/2019 2041   GLUCOSEU 50 (A) 01/06/2019 2041   HGBUR NEGATIVE 01/06/2019 2041   BILIRUBINUR SMALL (A) 01/06/2019 2041   KETONESUR NEGATIVE 01/06/2019 2041   PROTEINUR 30 (A) 01/06/2019 2041   UROBILINOGEN 0.2 02/10/2007 1109   NITRITE NEGATIVE 01/06/2019 2041   LEUKOCYTESUR NEGATIVE 01/06/2019 2041    Radiological Exams on Admission: Dg Ribs Unilateral W/chest Right  Result Date: 01/09/2019 CLINICAL DATA:  Fever, increasing ascites, fall several days prior, pain on right side chest. EXAM: RIGHT RIBS AND CHEST - 3+ VIEW COMPARISON:  Radiograph 01/06/2019, 12/28/2018 FINDINGS: Radiopaque marker placed at the level of the lateral right tenth rib, no subjacent fracture. No visible  displaced rib fractures or other acute osseous abnormality. Degenerative changes in the spine and shoulders. Right upper extremity PICC tip terminates at the superior cavoatrial junction. Surgical clips and TIPS catheter noted in the right upper quadrant. No acute soft tissue abnormality. Trace right pleural effusion and bandlike areas of subsegmental atelectasis the right lung base. Additional retrocardiac opacity is similar to prior exam. Lungs otherwise clear. Cardiomediastinal contours are unremarkable. IMPRESSION: No visible displaced rib fracture or other acute osseous abnormality. Right basilar and retrocardiac opacities could reflect atelectasis though developing consolidation could have a similar appearance given setting of fever. Trace right effusion. Electronically Signed   By: MD Lovena Le   On: 01/09/2019 16:15   Ct Abdomen Pelvis W Contrast  Result Date: 01/09/2019 CLINICAL DATA:  Fall, low back pain, pancreatic cancer with biliary stent placement EXAM: CT ABDOMEN AND PELVIS WITH CONTRAST TECHNIQUE: Multidetector CT imaging of the abdomen and pelvis was performed using the standard protocol following bolus administration of intravenous contrast. CONTRAST:  1107mL OMNIPAQUE IOHEXOL 300 MG/ML  SOLN COMPARISON:  12/24/2018 FINDINGS: Lower chest: Small to moderate bilateral pleural effusions and associated atelectasis or consolidation, new compared to prior examination. Coronary artery calcifications. Hepatobiliary: No solid liver abnormality is seen. Redemonstrated common bile duct stent with slightly increased biliary ductal dilatation and redemonstrated post stenting pneumobilia. No change in appearance or configuration of stent. Pancreas: Primary pancreatic mass is not clearly appreciated on this examination, with atrophy and ductal dilatation of the pancreatic body and tail. Spleen: Splenomegaly, maximum coronal span 19.3 cm. Adrenals/Urinary Tract: Adrenal glands are unremarkable. Nonobstructive  bilateral nephrolithiasis. No hydronephrosis. Bladder is unremarkable. Stomach/Bowel: Stomach is within normal limits. Appendix appears normal. No evidence of bowel wall thickening, distention, or inflammatory changes. Vascular/Lymphatic: Aortic atherosclerosis. No enlarged abdominal or pelvic lymph nodes. Reproductive: No mass or other significant abnormality. Other:  No abdominal wall hernia or abnormality. Moderate to large volume ascites, which is increased compared to prior examination. Anasarca. Musculoskeletal: There are mildly displaced fracture heads of the right tenth and eleventh ribs as well as the right transverse process of the T10 vertebral body. IMPRESSION: 1. There are mildly displaced fracture heads of the right tenth and eleventh ribs as well as the right transverse process of the T10 vertebral body. 2. Redemonstrated common bile duct stent with slightly increased biliary ductal dilatation and redemonstrated post stenting pneumobilia. No change in appearance or configuration of stent. Primary pancreatic mass is not clearly appreciated on this examination, with atrophy and ductal dilatation of the pancreatic body and tail. 3. Pleural effusions, ascites, and anasarca, new and increased compared to prior examination. Electronically Signed   By: Eddie Candle M.D.   On: 01/09/2019 16:52    Assessment/Plan Active Problems:   Essential hypertension   Hyperlipemia   Elevated LFTs   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[place])   Streptococcal bacteremia   Candidemia ([place])   Falls   Falls     - reports multiple falls over the last several months and a general feeling of being unsteady on his feet.      - most recent fall 2 nights ago; no LOC, head injury; Mechanism of fall was tripping over his moving dog     - Will get PT/OT to see     - films negative for Fx  Elevated LFT/Hyperbilirubinemia     - history of pancreatic CA w/ biliary stent     - GI has seen and will take for ERCP in AM; NPO  after midnight  Pancreatic CA     - Follows with Dr. Benay Spice     - s/p 7 cycles FOLFIRI     - they note a 21lb weight increase since 12/24/2018 and were scheduling him for paracentesis; consider completion after ERCP  Streptococcal bacteremia/Candidemia     - currently on ertapenem through 01/26/2019 and capsofungin through 02/09/2019; continue  HTN     - continue amlodipine, irbesartan   Admit to inpt, med-surg. GI onboard, appreciate assistance. Resume home meds and current IV abx therapy.  DVT prophylaxis: heparin  Code Status: FULL  Family Communication: None at bedside  Disposition Plan: TBD   Admission status: Inpatient  Severity of illness and risk of decompensation warrant placement in inpatient status. Expect him to require greater than 2MN stay.   Jonnie Finner DO [facility] Pager [phone]  If 7PM-7AM, please contact night-coverage [email or web address] Password [facility] [address]  01/09/2019, 5:48 PM

## 2019-01-10 ENCOUNTER — Inpatient Hospital Stay (HOSPITAL_COMMUNITY): Payer: BC Managed Care – PPO

## 2019-01-10 ENCOUNTER — Encounter (HOSPITAL_COMMUNITY): Payer: Self-pay | Admitting: Anesthesiology

## 2019-01-10 ENCOUNTER — Inpatient Hospital Stay (HOSPITAL_COMMUNITY): Payer: BC Managed Care – PPO | Admitting: Anesthesiology

## 2019-01-10 ENCOUNTER — Encounter (HOSPITAL_COMMUNITY)
Admission: EM | Disposition: A | Discharge status: Home / Self Care | Payer: Self-pay | Source: Home / Self Care | Attending: Internal Medicine

## 2019-01-10 DIAGNOSIS — K831 Obstruction of bile duct: Secondary | ICD-10-CM

## 2019-01-10 DIAGNOSIS — W19XXXD Unspecified fall, subsequent encounter: Secondary | ICD-10-CM

## 2019-01-10 DIAGNOSIS — K8050 Calculus of bile duct without cholangitis or cholecystitis without obstruction: Secondary | ICD-10-CM

## 2019-01-10 DIAGNOSIS — E785 Hyperlipidemia, unspecified: Secondary | ICD-10-CM

## 2019-01-10 HISTORY — PX: ERCP: SHX5425

## 2019-01-10 HISTORY — PX: BILIARY STENT PLACEMENT: SHX5538

## 2019-01-10 LAB — COMPREHENSIVE METABOLIC PANEL
ALT: 133 U/L — ABNORMAL HIGH (ref 0–44)
AST: 171 U/L — ABNORMAL HIGH (ref 15–41)
Albumin: 2.1 g/dL — ABNORMAL LOW (ref 3.5–5.0)
Alkaline Phosphatase: 778 U/L — ABNORMAL HIGH (ref 38–126)
Anion gap: 9 (ref 5–15)
BUN: 8 mg/dL (ref 8–23)
CO2: 26 mmol/L (ref 22–32)
Calcium: 7.8 mg/dL — ABNORMAL LOW (ref 8.9–10.3)
Chloride: 95 mmol/L — ABNORMAL LOW (ref 98–111)
Creatinine, Ser: 0.4 mg/dL — ABNORMAL LOW (ref 0.61–1.24)
GFR calc Af Amer: >60 mL/min (ref >60)
GFR calc non Af Amer: >60 mL/min (ref >60)
Glucose, Bld: 101 mg/dL — ABNORMAL HIGH (ref 70–99)
Potassium: 4.2 mmol/L (ref 3.5–5.1)
Sodium: 130 mmol/L — ABNORMAL LOW (ref 135–145)
Total Bilirubin: 9 mg/dL — ABNORMAL HIGH (ref 0.3–1.2)
Total Protein: 4.9 g/dL — ABNORMAL LOW (ref 6.5–8.1)

## 2019-01-10 LAB — BLOOD CULTURE ID PANEL (REFLEXED)
Acinetobacter baumannii: NOT DETECTED
Candida albicans: NOT DETECTED
Candida glabrata: NOT DETECTED
Candida krusei: NOT DETECTED
Candida parapsilosis: NOT DETECTED
Candida tropicalis: NOT DETECTED
Enterobacter cloacae complex: NOT DETECTED
Enterobacteriaceae species: NOT DETECTED
Enterococcus species: DETECTED — AB
Escherichia coli: NOT DETECTED
Haemophilus influenzae: NOT DETECTED
Klebsiella oxytoca: NOT DETECTED
Klebsiella pneumoniae: NOT DETECTED
Listeria monocytogenes: NOT DETECTED
Methicillin resistance: DETECTED — AB
Neisseria meningitidis: NOT DETECTED
Proteus species: NOT DETECTED
Pseudomonas aeruginosa: NOT DETECTED
Serratia marcescens: NOT DETECTED
Staphylococcus aureus (BCID): NOT DETECTED
Staphylococcus species: DETECTED — AB
Streptococcus agalactiae: NOT DETECTED
Streptococcus pneumoniae: NOT DETECTED
Streptococcus pyogenes: NOT DETECTED
Streptococcus species: NOT DETECTED
Vancomycin resistance: NOT DETECTED

## 2019-01-10 LAB — MAGNESIUM: Magnesium: 1.8 mg/dL (ref 1.7–2.4)

## 2019-01-10 LAB — CBC
HCT: 27.9 % — ABNORMAL LOW (ref 39.0–52.0)
Hemoglobin: 8.7 g/dL — ABNORMAL LOW (ref 13.0–17.0)
MCH: 27.5 pg (ref 26.0–34.0)
MCHC: 31.2 g/dL (ref 30.0–36.0)
MCV: 88.3 fL (ref 80.0–100.0)
Platelets: 59 10*3/uL — ABNORMAL LOW (ref 150–400)
RBC: 3.16 MIL/uL — ABNORMAL LOW (ref 4.22–5.81)
RDW: 15.5 % (ref 11.5–15.5)
WBC: 3.7 10*3/uL — ABNORMAL LOW (ref 4.0–10.5)
nRBC: 0 % (ref 0.0–0.2)

## 2019-01-10 LAB — PROTIME-INR
INR: 1.1 (ref 0.8–1.2)
Prothrombin Time: 14.5 seconds (ref 11.4–15.2)

## 2019-01-10 SURGERY — ERCP, WITH INTERVENTION IF INDICATED
Anesthesia: General

## 2019-01-10 MED ORDER — MIDAZOLAM HCL 5 MG/5ML IJ SOLN
INTRAMUSCULAR | Status: DC | PRN
  Administered 2019-01-10 (×2): 1 mg via INTRAVENOUS

## 2019-01-10 MED ORDER — LACTATED RINGERS IV SOLN
INTRAVENOUS | Status: DC
  Administered 2019-01-10: 12:00:00 1000 mL via INTRAVENOUS
  Administered 2019-01-10: 14:00:00 via INTRAVENOUS

## 2019-01-10 MED ORDER — MORPHINE SULFATE (PF) 2 MG/ML IV SOLN
2.0000 mg | Freq: Once | INTRAVENOUS | Status: AC
  Administered 2019-01-10: 08:00:00 2 mg via INTRAVENOUS
  Filled 2019-01-10: qty 1

## 2019-01-10 MED ORDER — LIDOCAINE HCL 1 % IJ SOLN
INTRAMUSCULAR | Status: AC
  Filled 2019-01-10: qty 10

## 2019-01-10 MED ORDER — ROCURONIUM BROMIDE 100 MG/10ML IV SOLN
INTRAVENOUS | Status: DC | PRN
  Administered 2019-01-10: 5 mg via INTRAVENOUS
  Administered 2019-01-10: 20 mg via INTRAVENOUS

## 2019-01-10 MED ORDER — INDOMETHACIN 50 MG RE SUPP
RECTAL | Status: AC
  Filled 2019-01-10: qty 2

## 2019-01-10 MED ORDER — FENTANYL CITRATE (PF) 100 MCG/2ML IJ SOLN
INTRAMUSCULAR | Status: AC
  Filled 2019-01-10: qty 2

## 2019-01-10 MED ORDER — SODIUM CHLORIDE 0.9 % IV SOLN
INTRAVENOUS | Status: DC | PRN
  Administered 2019-01-10: 25 mL

## 2019-01-10 MED ORDER — SUCCINYLCHOLINE CHLORIDE 20 MG/ML IJ SOLN
INTRAMUSCULAR | Status: DC | PRN
  Administered 2019-01-10: 100 mg via INTRAVENOUS

## 2019-01-10 MED ORDER — SODIUM CHLORIDE 0.9 % IV SOLN
1.0000 g | Freq: Three times a day (TID) | INTRAVENOUS | Status: DC
  Administered 2019-01-10 – 2019-01-16 (×18): 1 g via INTRAVENOUS
  Filled 2019-01-10 (×20): qty 1

## 2019-01-10 MED ORDER — ONDANSETRON HCL 4 MG/2ML IJ SOLN
INTRAMUSCULAR | Status: DC | PRN
  Administered 2019-01-10: 4 mg via INTRAVENOUS

## 2019-01-10 MED ORDER — PROPOFOL 10 MG/ML IV BOLUS
INTRAVENOUS | Status: AC
  Filled 2019-01-10: qty 20

## 2019-01-10 MED ORDER — MIDAZOLAM HCL 2 MG/2ML IJ SOLN
INTRAMUSCULAR | Status: AC
  Filled 2019-01-10: qty 2

## 2019-01-10 MED ORDER — PROPOFOL 10 MG/ML IV BOLUS
INTRAVENOUS | Status: DC | PRN
  Administered 2019-01-10: 170 mg via INTRAVENOUS

## 2019-01-10 MED ORDER — FENTANYL CITRATE (PF) 100 MCG/2ML IJ SOLN
INTRAMUSCULAR | Status: DC | PRN
  Administered 2019-01-10: 25 ug via INTRAVENOUS
  Administered 2019-01-10 (×2): 50 ug via INTRAVENOUS

## 2019-01-10 MED ORDER — SUGAMMADEX SODIUM 200 MG/2ML IV SOLN
INTRAVENOUS | Status: DC | PRN
  Administered 2019-01-10: 100 mg via INTRAVENOUS

## 2019-01-10 MED ORDER — SODIUM CHLORIDE 0.9 % IV SOLN: INTRAVENOUS | Status: DC

## 2019-01-10 MED ORDER — SODIUM CHLORIDE 0.9 % IV SOLN
INTRAVENOUS | Status: DC | PRN
  Administered 2019-01-10: 40 ug via INTRAVENOUS

## 2019-01-10 MED ORDER — INDOMETHACIN 50 MG RE SUPP
RECTAL | Status: DC | PRN
  Administered 2019-01-10: 100 mg via RECTAL

## 2019-01-10 MED ORDER — LIDOCAINE HCL (CARDIAC) PF 100 MG/5ML IV SOSY
PREFILLED_SYRINGE | INTRAVENOUS | Status: DC | PRN
  Administered 2019-01-10: 30 mg via INTRAVENOUS

## 2019-01-10 MED ORDER — GLUCAGON HCL RDNA (DIAGNOSTIC) 1 MG IJ SOLR
INTRAMUSCULAR | Status: AC
  Filled 2019-01-10: qty 1

## 2019-01-10 MED ORDER — INDOMETHACIN 50 MG RE SUPP
100.0000 mg | Freq: Once | RECTAL | Status: DC
  Filled 2019-01-10: qty 2

## 2019-01-10 MED ORDER — VANCOMYCIN HCL 10 G IV SOLR
1750.0000 mg | Freq: Once | INTRAVENOUS | Status: AC
  Administered 2019-01-10: 07:00:00 1750 mg via INTRAVENOUS
  Filled 2019-01-10: qty 1750

## 2019-01-10 NOTE — Progress Notes (Signed)
PHARMACY - PHYSICIAN COMMUNICATION CRITICAL VALUE ALERT - BLOOD CULTURE IDENTIFICATION (BCID)  Cory Kelly is an 63 y.o. male who presented to [facility] on 01/09/2019 with a chief complaint of elevated LFTs/bilirubin.   Assessment:  Pancreatic cancer (include suspected source if known)  2 of 4 bottles staph species with MecA and enterococcus species  Name of physician (or Provider) Contacted: Tylene Fantasia, NP  Current antibiotics: Ertapenem for streptococcal bacteremia And Eraxis for candidemia  Changes to prescribed antibiotics recommended:  Will give x1 dose Vancomycin and f/u with ID  Results for orders placed or performed in visit on 01/09/19  Blood Culture ID Panel (Reflexed) (Collected: 01/09/2019  1:13 PM)  Result Value Ref Range   Enterococcus species DETECTED (A) NOT DETECTED   Vancomycin resistance NOT DETECTED NOT DETECTED   Listeria monocytogenes NOT DETECTED NOT DETECTED   Staphylococcus species DETECTED (A) NOT DETECTED   Staphylococcus aureus (BCID) NOT DETECTED NOT DETECTED   Methicillin resistance DETECTED (A) NOT DETECTED   Streptococcus species NOT DETECTED NOT DETECTED   Streptococcus agalactiae NOT DETECTED NOT DETECTED   Streptococcus pneumoniae NOT DETECTED NOT DETECTED   Streptococcus pyogenes NOT DETECTED NOT DETECTED   Acinetobacter baumannii NOT DETECTED NOT DETECTED   Enterobacteriaceae species NOT DETECTED NOT DETECTED   Enterobacter cloacae complex NOT DETECTED NOT DETECTED   Escherichia coli NOT DETECTED NOT DETECTED   Klebsiella oxytoca NOT DETECTED NOT DETECTED   Klebsiella pneumoniae NOT DETECTED NOT DETECTED   Proteus species NOT DETECTED NOT DETECTED   Serratia marcescens NOT DETECTED NOT DETECTED   Haemophilus influenzae NOT DETECTED NOT DETECTED   Neisseria meningitidis NOT DETECTED NOT DETECTED   Pseudomonas aeruginosa NOT DETECTED NOT DETECTED   Candida albicans NOT DETECTED NOT DETECTED   Candida glabrata NOT DETECTED NOT DETECTED   Candida krusei NOT DETECTED NOT DETECTED   Candida parapsilosis NOT DETECTED NOT DETECTED   Candida tropicalis NOT DETECTED NOT DETECTED    Dorrene German 01/10/2019  5:38 AM

## 2019-01-10 NOTE — Progress Notes (Addendum)
HEMATOLOGY-ONCOLOGY PROGRESS NOTE  SUBJECTIVE:  Cory Kelly is readmitted with fevers, elevated bilirubin, and weight loss.  Total bilirubin was 8.0, AST is 268, ALT 175 and alkaline phosphatase 1031 on admission.  The patient is confused this morning.  Has remained afebrile since admission.  Denies chills.  Reports abdominal discomfort and fullness.  Has nausea and vomiting.  The patient has been evaluated by GI and will undergo an ERCP later today.  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[place])   08/31/2017 - 11/22/2017 Chemotherapy   The patient had palonosetron (ALOXI) injection 0.25 mg, 0.25 mg, Intravenous,  Once, 6 of 6 cycles Administration: 0.25 mg (08/31/2017), 0.25 mg (09/14/2017), 0.25 mg (09/28/2017), 0.25 mg (10/12/2017), 0.25 mg (10/26/2017), 0.25 mg (11/09/2017) pegfilgrastim-cbqv (UDENYCA) injection 6 mg, 6 mg, Subcutaneous, Once, 6 of 6 cycles Administration: 6 mg (09/02/2017), 6 mg (09/16/2017), 6 mg (09/30/2017), 6 mg (10/14/2017), 6 mg (10/28/2017), 6 mg (11/11/2017) irinotecan (CAMPTOSAR) 360 mg in sodium chloride 0.9 % 500 mL chemo infusion, 150 mg/m2 = 360 mg (100 % of original dose 150 mg/m2), Intravenous,  Once, 6 of 6 cycles Dose modification: 150 mg/m2 (original dose 150 mg/m2, Cycle 1, Reason: Provider Judgment) Administration: 360 mg (08/31/2017), 360 mg (09/14/2017), 360 mg (09/28/2017), 340 mg (10/12/2017), 340 mg (10/26/2017), 340 mg (11/09/2017) leucovorin 952 mg in sodium chloride 0.9 % 250 mL infusion, 400 mg/m2 = 952 mg, Intravenous,  Once, 6 of 6 cycles Administration: 952 mg (08/31/2017), 952 mg (09/14/2017), 952 mg (09/28/2017), 888 mg (10/12/2017), 888 mg (10/26/2017), 888 mg (11/09/2017) oxaliplatin (ELOXATIN) 200 mg in dextrose 5 % 500 mL chemo infusion, 85 mg/m2 = 200 mg, Intravenous,  Once, 6 of 6 cycles Dose modification: 65 mg/m2 (original dose 85 mg/m2, Cycle 4, Reason: Provider  Judgment) Administration: 200 mg (08/31/2017), 200 mg (09/14/2017), 200 mg (09/28/2017), 145 mg (10/12/2017), 150 mg (10/26/2017), 150 mg (11/09/2017) fosaprepitant (EMEND) 150 mg, dexamethasone (DECADRON) 12 mg in sodium chloride 0.9 % 145 mL IVPB, , Intravenous,  Once, 6 of 6 cycles Administration:  (08/31/2017),  (09/14/2017),  (09/28/2017),  (10/12/2017),  (10/26/2017),  (11/09/2017) fluorouracil (ADRUCIL) 5,700 mg in sodium chloride 0.9 % 136 mL chemo infusion, 2,400 mg/m2 = 5,700 mg, Intravenous, 1 Day/Dose, 6 of 6 cycles Administration: 5,700 mg (08/31/2017), 5,700 mg (09/14/2017), 5,700 mg (09/28/2017), 5,350 mg (10/12/2017), 5,350 mg (10/26/2017), 5,350 mg (11/09/2017)  for chemotherapy treatment.    12/06/2017 - 02/28/2018 Chemotherapy   The patient had gemcitabine (GEMZAR) 1,786 mg in sodium chloride 0.9 % 250 mL chemo infusion, 800 mg/m2 = 1,786 mg (80 % of original dose 1,000 mg/m2), Intravenous,  Once, 4 of 4 cycles Dose modification: 800 mg/m2 (original dose 1,000 mg/m2, Cycle 1, Reason: Provider Judgment), 800 mg/m2 (original dose 1,000 mg/m2, Cycle 1, Reason: Provider Judgment, Comment: call from MD to decrease dose due to counts) Administration: 1,786 mg (12/20/2017), 1,786 mg (12/06/2017), 1,786 mg (01/03/2018), 1,786 mg (01/17/2018), 1,786 mg (01/31/2018), 1,786 mg (02/14/2018), 1,786 mg (02/28/2018) PACLitaxel-protein bound (ABRAXANE) chemo infusion 225 mg, 100 mg/m2 = 225 mg (80 % of original dose 125 mg/m2), Intravenous, Once, 3 of 3 cycles Dose modification: 100 mg/m2 (original dose 125 mg/m2, Cycle 1, Reason: Provider Judgment), 100 mg/m2 (original dose 125 mg/m2, Cycle 1, Reason: Provider Judgment, Comment: call from MD to decrease dose based on counts) Administration: 225 mg (12/20/2017), 225 mg (12/06/2017), 225 mg (01/03/2018), 225 mg (01/17/2018)  for chemotherapy treatment.  07/04/2018 Cancer Staging   Staging form: Exocrine Pancreas, AJCC 8th Edition - Pathologic: Stage IV (cM1) - Signed by Ladell Pier, MD on 07/04/2018   08/21/2018 -  Chemotherapy   The patient had palonosetron (ALOXI) injection 0.25 mg, 0.25 mg, Intravenous,  Once, 7 of 8 cycles Administration: 0.25 mg (08/21/2018), 0.25 mg (09/03/2018), 0.25 mg (09/18/2018), 0.25 mg (10/01/2018), 0.25 mg (10/16/2018), 0.25 mg (11/14/2018), 0.25 mg (12/10/2018) pegfilgrastim-cbqv (UDENYCA) injection 6 mg, 6 mg, Subcutaneous, Once, 7 of 8 cycles Administration: 6 mg (08/23/2018), 6 mg (09/05/2018), 6 mg (09/20/2018), 6 mg (10/03/2018), 6 mg (10/18/2018), 6 mg (11/16/2018) irinotecan (CAMPTOSAR) 360 mg in dextrose 5 % 500 mL chemo infusion, 150 mg/m2 = 360 mg (100 % of original dose 150 mg/m2), Intravenous,  Once, 7 of 8 cycles Dose modification: 150 mg/m2 (original dose 150 mg/m2, Cycle 1, Reason: Provider Judgment), 112.5 mg/m2 (original dose 150 mg/m2, Cycle 1, Reason: Change in LFTs), 90 mg/m2 (original dose 150 mg/m2, Cycle 6, Reason: Provider Judgment), 90 mg/m2 (original dose 90 mg/m2, Cycle 7) Administration: 260 mg (08/21/2018), 260 mg (09/03/2018), 260 mg (09/18/2018), 260 mg (10/01/2018), 200 mg (11/14/2018), 200 mg (10/16/2018), 200 mg (12/10/2018) leucovorin 952 mg in dextrose 5 % 250 mL infusion, 400 mg/m2 = 952 mg, Intravenous,  Once, 7 of 8 cycles Dose modification: 300 mg/m2 (original dose 400 mg/m2, Cycle 1, Reason: Provider Judgment), 300 mg/m2 (original dose 300 mg/m2, Cycle 7) Administration: 696 mg (08/21/2018), 696 mg (09/03/2018), 696 mg (09/18/2018), 676 mg (10/01/2018), 676 mg (11/14/2018), 676 mg (10/16/2018), 676 mg (12/10/2018) oxaliplatin (ELOXATIN) 155 mg in dextrose 5 % 500 mL chemo infusion, 65 mg/m2 = 155 mg (100 % of original dose 65 mg/m2), Intravenous,  Once, 7 of 8 cycles Dose modification: 65 mg/m2 (original dose 65 mg/m2, Cycle 1, Reason: Provider Judgment) Administration: 150 mg (08/21/2018), 150 mg (09/03/2018), 150 mg (09/18/2018), 150 mg (10/01/2018), 150 mg (10/16/2018), 150 mg (11/14/2018), 150 mg (12/10/2018) fosaprepitant (EMEND) 150 mg,  dexamethasone (DECADRON) 12 mg in sodium chloride 0.9 % 145 mL IVPB, , Intravenous,  Once, 7 of 8 cycles Administration:  (08/21/2018),  (09/03/2018),  (09/18/2018),  (10/01/2018),  (10/16/2018),  (11/14/2018),  (12/10/2018) fluorouracil (ADRUCIL) 5,700 mg in sodium chloride 0.9 % 136 mL chemo infusion, 2,400 mg/m2 = 5,700 mg, Intravenous, 1 Day/Dose, 7 of 8 cycles Dose modification: 1,800 mg/m2 (original dose 2,400 mg/m2, Cycle 1, Reason: Change in LFTs) Administration: 4,200 mg (08/21/2018), 4,200 mg (09/03/2018), 4,200 mg (09/18/2018), 4,050 mg (10/01/2018), 4,050 mg (10/16/2018), 4,050 mg (11/14/2018), 4,050 mg (12/10/2018)  for chemotherapy treatment.      REVIEW OF SYSTEMS:   Constitutional: Had fevers prior to admission.  No chills. Eyes: Denies blurriness of vision Ears, nose, mouth, throat, and face: Denies mucositis or sore throat Respiratory: Denies cough, dyspnea or wheezes Cardiovascular: Denies palpitation, chest discomfort Gastrointestinal: Reports abdominal bloating and abdominal discomfort Denies nausea, vomiting, constipation, diarrhea. Skin: Denies abnormal skin rashes Lymphatics: Denies new lymphadenopathy or easy bruising Neurological:Denies numbness, tingling or new weaknesses Behavioral/Psych: Mood is stable, no new changes  Extremities: Reports lower extremity edema. All other systems were reviewed with the patient and are negative.  I have reviewed the past medical history, past surgical history, social history and family history with the patient and they are unchanged from previous note.   PHYSICAL EXAMINATION:  Vitals:   01/10/19 0704 01/10/19 1140  BP: 138/88 137/86  Pulse: 84 87  Resp: 20 16  Temp: 98.2 F (36.8 C) 98.8 F (37.1 C)  SpO2: 97% 98%   Filed Weights   01/10/19 1140  Weight: 195 lb 5.2 oz (88.6 kg)    Intake/Output from previous day: 07/08 0701 - 07/09 0700 In: 230 [IV Piggyback:230] Out: -   GENERAL:alert, no distress, confused a times EYES:  scleral icterus noted OROPHARYNX: no thrush or mucositis NECK: supple, thyroid normal size, non-tender, without nodularity LYMPH:  no palpable lymphadenopathy in the cervical, axillary or inguinal LUNGS: clear to auscultation and percussion with normal breathing effort HEART: regular rate & rhythm and no murmurs and no lower extremity edema ABDOMEN: Positive bowel sounds.  Moderate ascites noted.  No abdominal pain with palpation. Musculoskeletal:no cyanosis of digits and no clubbing  NEURO: alert & oriented x 3 with fluent speech, no focal motor/sensory deficits  LABORATORY DATA:  I have reviewed the data as listed CMP Latest Ref Rng & Units 01/10/2019 01/09/2019 01/06/2019  Glucose 70 - 99 mg/dL 101(H) 110(H) 165(H)  BUN 8 - 23 mg/dL 8 7(L) 7(L)  Creatinine 0.61 - 1.24 mg/dL 0.40(L) 0.67 0.77  Sodium 135 - 145 mmol/L 130(L) 130(L) 132(L)  Potassium 3.5 - 5.1 mmol/L 4.2 4.0 3.8  Chloride 98 - 111 mmol/L 95(L) 95(L) 95(L)  CO2 22 - 32 mmol/L '26 27 29  '$ Calcium 8.9 - 10.3 mg/dL 7.8(L) 8.0(L) 8.0(L)  Total Protein 6.5 - 8.1 g/dL 4.9(L) 5.0(L) 5.1(L)  Total Bilirubin 0.3 - 1.2 mg/dL 9.0(H) 8.0(HH) 3.3(H)  Alkaline Phos 38 - 126 U/L 778(H) 1,031(H) 834(H)  AST 15 - 41 U/L 171(H) 268(HH) 226(H)  ALT 0 - 44 U/L 133(H) 175(H) 94(H)    Lab Results  Component Value Date   WBC 3.7 (L) 01/10/2019   HGB 8.7 (L) 01/10/2019   HCT 27.9 (L) 01/10/2019   MCV 88.3 01/10/2019   PLT 59 (L) 01/10/2019   NEUTROABS 3.6 01/09/2019    Dg Chest 2 View  Result Date: 12/28/2018 CLINICAL DATA:  Sepsis EXAM: CHEST - 2 VIEW COMPARISON:  December 24, 2018 FINDINGS: The right-sided Port-A-Cath has been removed. There is a new right-sided PICC line with tip terminating over the distal SVC. The cardiac size is stable. There is no pneumothorax. No large pleural effusion. The lung volumes are slightly low. IMPRESSION: No active cardiopulmonary disease. Electronically Signed   By: Constance Holster M.D.   On: 12/28/2018  18:53   Dg Ribs Unilateral W/chest Right  Result Date: 01/09/2019 CLINICAL DATA:  Fever, increasing ascites, fall several days prior, pain on right side chest. EXAM: RIGHT RIBS AND CHEST - 3+ VIEW COMPARISON:  Radiograph 01/06/2019, 12/28/2018 FINDINGS: Radiopaque marker placed at the level of the lateral right tenth rib, no subjacent fracture. No visible displaced rib fractures or other acute osseous abnormality. Degenerative changes in the spine and shoulders. Right upper extremity PICC tip terminates at the superior cavoatrial junction. Surgical clips and TIPS catheter noted in the right upper quadrant. No acute soft tissue abnormality. Trace right pleural effusion and bandlike areas of subsegmental atelectasis the right lung base. Additional retrocardiac opacity is similar to prior exam. Lungs otherwise clear. Cardiomediastinal contours are unremarkable. IMPRESSION: No visible displaced rib fracture or other acute osseous abnormality. Right basilar and retrocardiac opacities could reflect atelectasis though developing consolidation could have a similar appearance given setting of fever. Trace right effusion. Electronically Signed   By: MD Lovena Le   On: 01/09/2019 16:15   Ct Abdomen Pelvis W Contrast  Result Date: 01/09/2019 CLINICAL DATA:  Fall, low back pain, pancreatic cancer with biliary stent placement  EXAM: CT ABDOMEN AND PELVIS WITH CONTRAST TECHNIQUE: Multidetector CT imaging of the abdomen and pelvis was performed using the standard protocol following bolus administration of intravenous contrast. CONTRAST:  125m OMNIPAQUE IOHEXOL 300 MG/ML  SOLN COMPARISON:  12/24/2018 FINDINGS: Lower chest: Small to moderate bilateral pleural effusions and associated atelectasis or consolidation, new compared to prior examination. Coronary artery calcifications. Hepatobiliary: No solid liver abnormality is seen. Redemonstrated common bile duct stent with slightly increased biliary ductal dilatation and  redemonstrated post stenting pneumobilia. No change in appearance or configuration of stent. Pancreas: Primary pancreatic mass is not clearly appreciated on this examination, with atrophy and ductal dilatation of the pancreatic body and tail. Spleen: Splenomegaly, maximum coronal span 19.3 cm. Adrenals/Urinary Tract: Adrenal glands are unremarkable. Nonobstructive bilateral nephrolithiasis. No hydronephrosis. Bladder is unremarkable. Stomach/Bowel: Stomach is within normal limits. Appendix appears normal. No evidence of bowel wall thickening, distention, or inflammatory changes. Vascular/Lymphatic: Aortic atherosclerosis. No enlarged abdominal or pelvic lymph nodes. Reproductive: No mass or other significant abnormality. Other: No abdominal wall hernia or abnormality. Moderate to large volume ascites, which is increased compared to prior examination. Anasarca. Musculoskeletal: There are mildly displaced fracture heads of the right tenth and eleventh ribs as well as the right transverse process of the T10 vertebral body. IMPRESSION: 1. There are mildly displaced fracture heads of the right tenth and eleventh ribs as well as the right transverse process of the T10 vertebral body. 2. Redemonstrated common bile duct stent with slightly increased biliary ductal dilatation and redemonstrated post stenting pneumobilia. No change in appearance or configuration of stent. Primary pancreatic mass is not clearly appreciated on this examination, with atrophy and ductal dilatation of the pancreatic body and tail. 3. Pleural effusions, ascites, and anasarca, new and increased compared to prior examination. Electronically Signed   By: AEddie CandleM.D.   On: 01/09/2019 16:52   Ct Abdomen Pelvis W Contrast  Result Date: 12/24/2018 CLINICAL DATA:  Abdominal distension, fever, history of pancreatic cancer EXAM: CT ABDOMEN AND PELVIS WITH CONTRAST TECHNIQUE: Multidetector CT imaging of the abdomen and pelvis was performed using the  standard protocol following bolus administration of intravenous contrast. CONTRAST:  1065mOMNIPAQUE IOHEXOL 300 MG/ML  SOLN COMPARISON:  MR abdomen pelvis, 12/04/2018, 10/30/2018 FINDINGS: Lower chest: No acute abnormality. Hepatobiliary: Known liver metastases are poorly appreciated by single-phase CT, for example in the dome of the liver (series 2, image 14). No gallstones or gallbladder wall thickening. Common bile duct stent is positioned tip in the descending portion of the duodenum with post stenting pneumobilia. Pancreas: Discrete pancreatic head mass is not well appreciated, primarily appreciated by mass effect on the confluence of the portal vein (series 2, image 38. There is atrophy of the pancreatic body and tail and dilatation pancreatic duct. Spleen: Splenomegaly. Adrenals/Urinary Tract: Adrenal glands are unremarkable. Nonobstructive bilateral nephrolithiasis. Bladder is unremarkable. Stomach/Bowel: Stomach is within normal limits. Appendix is not clearly visualized. No evidence of bowel wall thickening, distention, or inflammatory changes. Large burden of stool in the distal colon and rectum. Vascular/Lymphatic: Aortic atherosclerosis. No enlarged abdominal or pelvic lymph nodes. Reproductive: No mass or other significant abnormality. Other: No abdominal wall hernia or abnormality. Moderate volume ascites. Musculoskeletal: No acute or significant osseous findings. IMPRESSION: 1. Redemonstrated pancreatic malignancy with hepatic metastases, status post common bile duct stenting. Primary mass and hepatic metastases are better appreciated by recent multiphasic MRI. 2.  Moderate volume ascites, which is similar to prior MRI. 3.  Splenomegaly. 4.  Large burden of stool  in the distal colon and rectum. 5.  Nonobstructive bilateral nephrolithiasis. Electronically Signed   By: Eddie Candle M.D.   On: 12/24/2018 14:35   US Paracentesis  Result Date: 12/31/2018 INDICATION: Patient with history of metastatic  pancreatic cancer, ascites. Request made for diagnostic and therapeutic paracentesis. EXAM: ULTRASOUND GUIDED DIAGNOSTIC AND THERAPEUTIC PARACENTESIS MEDICATIONS: None COMPLICATIONS: None immediate. PROCEDURE: Informed written consent was obtained from the patient after a discussion of the risks, benefits and alternatives to treatment. A timeout was performed prior to the initiation of the procedure. Initial ultrasound scanning demonstrates a small to moderate amount of ascites within the right upper to mid abdominal quadrant. The right upper to mid abdomen was prepped and draped in the usual sterile fashion. 1% lidocaine was used for local anesthesia. Following this, a 19 gauge, 7-cm, Yueh catheter was introduced. An ultrasound image was saved for documentation purposes. The paracentesis was performed. The catheter was removed and a dressing was applied. The patient tolerated the procedure well without immediate post procedural complication. FINDINGS: A total of approximately 2.6 liters of slightly hazy, light yellow fluid was removed. Samples were sent to the laboratory as requested by the clinical team. IMPRESSION: Successful ultrasound-guided diagnostic and therapeutic paracentesis yielding 2.6 liters of peritoneal fluid. Read by: Rowe Robert, PA-C Electronically Signed   By: Sandi Mariscal M.D.   On: 12/31/2018 12:04   Ir Removal [employer] W/ [place] W/o Fl Mod Sed  Result Date: 12/27/2018 CLINICAL DATA:  Bacteremia. Indwelling right IJ port catheter placed 08/30/2017 by Dr. Pascal Lux, has been working well without evident complication , removal requested EXAM: EXAM TUNNELED PORT CATHETER REMOVAL TECHNIQUE: The procedure, risks (including but not limited to bleeding, infection, organ damage ), benefits, and alternatives were explained to the patient. Questions regarding the procedure were encouraged and answered. The patient understands and consents to the procedure. Intravenous Fentanyl 124mg and Versed '4mg'$  were  administered as conscious sedation during continuous monitoring of the patient's level of consciousness and physiological / cardiorespiratory status by the radiology RN, with a total moderate sedation time of 37 minutes. Overlying skin prepped with chlorhexidine, draped in usual sterile fashion, infiltrated locally with 1% lidocaine. A small incision was made over the scar from previous placement. The port catheter was dissected free from the underlying soft tissues and removed intact. There is no gross purulence. Hemostasis was achieved. The port pocket was closed with deep interrupted and subcuticular continuous 3-0 Monocryl sutures, then covered with Dermabond. The patient tolerated the procedure well. COMPLICATIONS: COMPLICATIONS None immediate IMPRESSION: 1.  Technically successful tunneled Port catheter removal. Electronically Signed   By: DLucrezia EuropeM.D.   On: 12/27/2018 16:14   Dg Chest Portable 1 View  Result Date: 01/06/2019 CLINICAL DATA:  Patient with fever EXAM: PORTABLE CHEST 1 VIEW COMPARISON:  Chest radiograph 12/28/2018 FINDINGS: Right upper extremity PICC line tip projects over the superior vena cava. Monitoring leads overlie the patient. Stable cardiac and mediastinal contours. Elevation of the right hemidiaphragm. Retrocardiac consolidative opacity. No pleural effusion or pneumothorax. IMPRESSION: Retrocardiac consolidative opacity which may represent atelectasis or pneumonia. Electronically Signed   By: DLovey NewcomerM.D.   On: 01/06/2019 21:02   Dg Chest Port 1 View  Result Date: 12/28/2018 CLINICAL DATA:  Sepsis hypertension EXAM: PORTABLE CHEST 1 VIEW COMPARISON:  12/28/2018, 12/24/2018 FINDINGS: Right upper extremity catheter tip overlies the SVC. No acute consolidation or effusion. Normal cardiomediastinal silhouette. No pneumothorax. IMPRESSION: No active disease. Electronically Signed   By: KDonavan Foil  M.D.   On: 12/28/2018 23:03   Dg Chest Portable 1 View  Result Date:  12/24/2018 CLINICAL DATA:  Fever and weakness. EXAM: PORTABLE CHEST 1 VIEW COMPARISON:  11/04/2018 FINDINGS: The cardiac silhouette, mediastinal and hilar contours are stable. Mild cardiac enlargement and mild tortuosity of the thoracic aorta. The right IJ power port is stable. The lungs are clear. No pleural effusions. The bony thorax is intact. IMPRESSION: No acute cardiopulmonary findings. Electronically Signed   By: Marijo Sanes M.D.   On: 12/24/2018 13:59   [place] Ascites (abdomen Limited)  Result Date: 12/25/2018 CLINICAL DATA:  63 year old male with abdominal distension, ascites EXAM: LIMITED ABDOMEN ULTRASOUND FOR ASCITES TECHNIQUE: Limited ultrasound survey for ascites was performed in all four abdominal quadrants. COMPARISON:  Prior CT scan of the abdomen and pelvis 12/24/2018 FINDINGS: Sonographic interrogation was performed of the 4 quadrants of the abdomen. There is trace perihepatic fluid in the right upper quadrant with multiple small loops of bowel. Overall, fluid volume is low. No safe window for paracentesis. IMPRESSION: Small volume ascites, insufficient for paracentesis. Electronically Signed   By: Jacqulynn Cadet M.D.   On: 12/25/2018 12:13   Ir Picc Placement Right >5 Yrs Inc Img Guide  Result Date: 12/27/2018 CLINICAL DATA:  Bacteremia, needs venous access for planned chemotherapy Monday EXAM: PICC PLACEMENT WITH ULTRASOUND AND FLUOROSCOPY FLUOROSCOPY TIME:  6 seconds, 1 mGy TECHNIQUE: After written informed consent was obtained, patient was placed in the supine position on angiographic table. Patency of the right basilic vein was confirmed with ultrasound with image documentation. An appropriate skin site was determined. Skin site was marked. Region was prepped using maximum barrier technique including cap and mask, sterile gown, sterile gloves, large sterile sheet, and Chlorhexidine as cutaneous antisepsis. The region was infiltrated locally with 1% lidocaine. Under real-time  ultrasound guidance, the right basilic vein was accessed with a 21 gauge micropuncture needle; the needle tip within the vein was confirmed with ultrasound image documentation. Needle exchanged over a 018 guidewire for a peel-away sheath, through which a 5-French single-lumen power injectable PICC trimmed to 46cm was advanced, positioned with its tip near the cavoatrial junction. Spot chest radiograph confirms appropriate catheter position. Catheter was flushed per protocol and secured externally. The patient tolerated procedure well. COMPLICATIONS: COMPLICATIONS none IMPRESSION: 1. Technically successful five [place] single lumen power injectable PICC placement Electronically Signed   By: Lucrezia Europe M.D.   On: 12/27/2018 16:15   [place] Ekg Site Rite  Result Date: 01/04/2019 If Site Rite image not attached, placement could not be confirmed due to current cardiac rhythm.   ASSESSMENT AND PLAN: 1. Pancreas head mass  CT abdomen/pelvis 08/03/2017-fullness in the head of the pancreas with stranding in the peripancreatic fat and pancreatic ductal dilatation.   MRI abdomen 08/04/2017-findings favored to represent acute on chronic pancreatitis; equivocal soft tissue fullness within the head and uncinate process of the pancreas; indeterminate too small to characterize right hepatic lobe lesion; small volume abdominal ascites; splenomegaly.   CA-19-9 elevated at 977on 08/04/2017.  Status postupper EUS 08/10/2017-findings of an irregular masslike region in the pancreatic head measuring approximately 2.7 cm. This directly abutted the main portal vein but no other major vascular structures. Biopsies obtained with preliminary cytology positive for malignancy, likely adenocarcinoma. The final report is pending. The common bile duct and main pancreatic duct were both dilated. ERCP was then proceeded with forstent placement. Multiple attempts were made tocannulate the bile duct without success. The procedure was  aborted.  08/16/2017  ERCP with placement of a metal biliary stent in the common bile duct by Dr. Francella Solian at [facility].  Cycle 1 FOLFIRINOX 08/31/2017  Cycle 2 FOLFIRINOX 09/14/2017  Cycle 3 FOLFIRINOX 09/28/2017  Cycle 4 FOLFIRINOX 10/12/2017 (oxaliplatin dose reduced secondary to thrombocytopenia)  Cycle 5 FOLFIRINOX 10/26/2017  Cycle 6 FOLFIRINOX 11/09/2017  Restaging CTs at [facility] 11/29/2017-no definitive evidence of distant metastatic disease. 2 subcentimeter liver lesions described on prior MRI not visualized on CT. Ill-defined pancreatic head mass stable to decreased in size measuring 2.1 x 2 cm. Peripancreatic inflammatory stranding decreased from prior. No biliary ductal dilatation. Celiac axis less than 180 degrees abutment. Common hepatic artery with greater than 180 degrees encasement; superior mesenteric artery with short segment less than 180 degrees abutment; portal vein/superior mesenteric vein with greater than 180 degrees encasement of the extrahepatic portal vein with associated circumferential narrowing at the portomesenteric, overall substantially improved from prior examination. Now less than 180 degree abutment of the SMV. The portal vein and SMV remain patent. Splenic vein patent.  Cycle 1 gemcitabine/Abraxane 12/06/2017  Cycle 2 gemcitabine/Abraxane 12/20/2017  Cycle 3 gemcitabine/Abraxane 01/03/2018  Cycle 4 gemcitabine/Abraxane 01/17/2018  Cycle 5 gemcitabine 01/31/2018 (Abraxane held due to neuropathy)  Cycle 6 gemcitabine 02/14/2018 (Abraxane held due to neuropathy)  Cycle 7 gemcitabine 02/28/2018 (Abraxane held due to neuropathy)  CT chest/abdomen/pelvis at [facility] 03/08/2018-stable appearance of previously identified infiltrating pancreatic head mass. No CT evidence of metastatic disease.  SBRT to the pancreas 04/05/2018-04/11/2018  05/09/2018 CA-19-9 [id number]   MRI abdomen 05/09/2018-similar 6 mm hypoenhancing focus posterior right lobe liver; more superior lesion near  the dome of the liver not identified on the postcontrast imaging but there is a persistent focus of diffusion signal abnormality in this region; new 8 mm hypoenhancing focus located just superior to the gallbladder; additional tiny focus of diffusion abnormality located more superiorly without obvious associated abnormal enhancement. Ill-defined pancreatic head mass not felt to be significantly changed.  CT chest/abdomen/pelvis 05/10/2018-ill-defined hypoattenuating pancreatic head mass and ill-defined soft tissue abutting the celiac axis with mildly increased dilatation of the main pancreatic duct. New soft tissue nodule along the right upper lobe bronchus; hypoattenuating liver lesions concerning for metastasis.  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 The stent traverses the pancreatic mass and extends into the duodenum. Increase in size of 2 liver lesions. Pancreatic mass appears similar. Indistinct tissue planes around the duodenum along with wall thickening in the gallbladder and especially the gallbladder neck.  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 Diffuse hepatic metastasis. Previous index lesions unchanged in size. Single new metastasis identified within the medial segment left lobe of liver. 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 Abdominal ascites increased in the interval.  Cycle 7 FOLFIRINOX6/02/2019 2. Hypertension 3. Port-A-Cath placement 08/30/2017 4. History of elevated bilirubin-questionGilbert's syndrome 5. Mild leukopenia, thrombocytopenia-question secondary to portal hypertension/splenomegaly;09/14/2017 white count improved, platelet count stable;progressive thrombocytopenia following gemcitabine/Abraxane 6. Kidney stones 7. Fever following cycles 2 and 3 gemcitabine/Abraxane,? Fever related to gemcitabine 8.Jaundice, elevated liver enzymes and bilirubin 06/22/2018; status post ERCP 06/25/2018 with debris removed from the existing stent and a new plastic stent placed. Recurrent jaundice 07/19/2018 status post ERCP 07/20/2018 with finding that the previously placed plastic stent had migrated distally. There was debris and sludge  on the stent. The previously placed stent was removed. 5 to 8 mm stricture just proximal to the existing SEMS. 2 plastic stents were placed.ERCP 08/16/2018-2 plastic biliary stents were removed from within the previously placed partially covered SEMS; stricture confirmed just proximal to the previously placed SEMS, treated with placement of an 8 cm long 10 mm diameter uncovered SEMS in good position. 9.Hospitalized with bacteremia Streptococcus group C 11/04/2018 through 11/08/2018. IV ceftriaxone through 11/17/2018. TEE 11/08/2018 negative. 10.Elevated LFTs 12/03/2018-MRI abdomen 12/04/2018 with mild increase in size of mass involving head of the pancreas. Diffuse hepatic metastasis. Previous index lesions unchanged in size. Single new metastasis identified within the medial segment left lobe of liver. 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 Abdominal ascites increased in the interval. 12/06/2018 ERCP-previously placed uncovered 8 cm long metal biliary stent nearly filled with bio debris and flecks of tissue ingrowth. Stent cleared. 11. 12/24/18 -admission with sepsis syndrome, blood culture from right arm positive for Streptococcus Anginosis, blood culture from Port-A-Cath with a Bacteroides species 12.  12/28/2018-readmission with sepsis syndrome 13. 01/09/2019 -readmission with hyperbilirubinemia  ASSESSMENT/PLAN: Mr. Cupp is admitted with biliary obstruction.  Total bilirubin is elevated to 9.0 this morning. He is encephalopathic.  Blood cultures drawn on 01/09/2019 are positive for enterococcus and Staphylococcus.  He has ascites noted on exam.  He continues to have pancytopenia secondary to recent chemotherapy.  1. ERCP later today. 2. Paracentesis by interventional radiology later today or tomorrow. 3.  Continue IV antibiotics and IV antifungal. 4.  Continue to monitor CBC and transfuse as needed.  No transfusion is indicated today. 5.  The patient may  benefit from palliative care consult during this hospitalization for discussion of goals of care.   LOS: 1 day   Mikey Bussing, DNP, AGPCNP-BC, AOCNP 01/10/19  Cory Kelly was admitted yesterday with failure to thrive, hyperbilirubinemia, and progressive ascites.  He will undergo an ERCP today.  I recommend a therapeutic/diagnostic paracentesis.  A blood culture from 01/09/2019 is growing gram-positive cocci.  There has been no clear evidence of disease progression, but I suspect the metastatic pancreas cancer has progressed.  The ascites may be related to portal hypertension.  If malignant ascites is confirmed I will recommend hospice care.  He has  recurrent bacteremia, likely related to tumor involving the GI tract and biliary obstruction.  I began a discussion regarding goals of care with Mr. Nannini.  He understands no therapy will be curative and we may need to change to a comfort care approach depending on the ERCP and paracentesis findings.

## 2019-01-10 NOTE — Progress Notes (Signed)
These preliminary result these preliminary results were noted.  Awaiting final report.

## 2019-01-10 NOTE — Anesthesia Postprocedure Evaluation (Signed)
Anesthesia Post Note  Patient: Cory Kelly  Procedure(s) Performed: ENDOSCOPIC RETROGRADE CHOLANGIOPANCREATOGRAPHY (ERCP) (N/A ) BILIARY STENT PLACEMENT (N/A )     Patient location during evaluation: Endoscopy Anesthesia Type: General Level of consciousness: awake and alert Pain management: pain level controlled Vital Signs Assessment: post-procedure vital signs reviewed and stable Respiratory status: spontaneous breathing, nonlabored ventilation, respiratory function stable and patient connected to nasal cannula oxygen Cardiovascular status: blood pressure returned to baseline and stable Postop Assessment: no apparent nausea or vomiting Anesthetic complications: no    Last Vitals:  Vitals:   01/10/19 1445 01/10/19 1450  BP:  124/66  Pulse: 81 81  Resp: 17 20  Temp:    SpO2: 100% 98%    Last Pain:  Vitals:   01/10/19 1450  TempSrc:   PainSc: 0-No pain                 Visente Kirker P Ingrid Shifrin

## 2019-01-10 NOTE — Procedures (Signed)
PROCEDURE SUMMARY:  Successful image-guided paracentesis from the right lateral abdomen.  Yielded 3.35 liters of clear gold fluid.  No immediate complications.  EBL = 5 mL. Patient tolerated well.   Specimen was sent for labs.  Claris Pong Sadia Belfiore PA-C 01/10/2019 4:14 PM

## 2019-01-10 NOTE — Anesthesia Procedure Notes (Signed)
Procedure Name: Intubation Date/Time: 01/10/2019 1:15 PM Performed by: Garrel Ridgel, CRNA Pre-anesthesia Checklist: Patient identified, Emergency Drugs available, Suction available and Timeout performed Patient Re-evaluated:Patient Re-evaluated prior to induction Oxygen Delivery Method: Circle system utilized Preoxygenation: Pre-oxygenation with 100% oxygen Induction Type: IV induction Ventilation: Mask ventilation without difficulty Laryngoscope Size: Mac and 3 Grade View: Grade I Tube type: Oral Tube size: 7.5 mm Number of attempts: 1 Airway Equipment and Method: Stylet Secured at: 22 cm Tube secured with: Tape Dental Injury: Teeth and Oropharynx as per pre-operative assessment

## 2019-01-10 NOTE — Anesthesia Preprocedure Evaluation (Addendum)
Anesthesia Evaluation  Patient identified by MRN, date of birth, ID band Patient awake    Reviewed: Allergy & Precautions, NPO status , Patient's Chart, lab work & pertinent test results  History of Anesthesia Complications Negative for: history of anesthetic complications  Airway Mallampati: II  TM Distance: >3 FB Neck ROM: Full    Dental  (+) Dental Advisory Given   Pulmonary neg pulmonary ROS,    breath sounds clear to auscultation       Cardiovascular hypertension, Pt. on medications  Rhythm:Regular Rate:Normal     Neuro/Psych  Hard of hearing  negative psych ROS   GI/Hepatic Neg liver ROS,  Pancreatic cancer    Endo/Other   Hyponatremia Hypocalcemia Hypochloremia   Renal/GU negative Renal ROS     Musculoskeletal negative musculoskeletal ROS (+)   Abdominal   Peds  Hematology  (+) anemia ,  Pancytopenia       Anesthesia Other Findings   Reproductive/Obstetrics                            Anesthesia Physical Anesthesia Plan  ASA: III  Anesthesia Plan: General   Post-op Pain Management:    Induction: Intravenous  PONV Risk Score and Plan: 2 and Treatment may vary due to age or medical condition and Ondansetron  Airway Management Planned: Oral ETT  Additional Equipment: None  Intra-op Plan:   Post-operative Plan: Extubation in OR  Informed Consent: I have reviewed the patients History and Physical, chart, labs and discussed the procedure including the risks, benefits and alternatives for the proposed anesthesia with the patient or authorized representative who has indicated his/her understanding and acceptance.     Dental advisory given  Plan Discussed with: CRNA and Anesthesiologist  Anesthesia Plan Comments:        Anesthesia Quick Evaluation

## 2019-01-10 NOTE — Progress Notes (Signed)
OT Cancellation Note  Patient Details Name: Cory Kelly MRN: [id number] DOB: 04/18/1956   Cancelled Treatment:    Reason Eval/Treat Not Completed: Patient at procedure or test/ unavailable  Markel Mergenthaler 01/10/2019, 11:56 AM  Lesle Chris, OTR/L Acute Rehabilitation Services [phone] [facility] pager [phone] office 01/10/2019

## 2019-01-10 NOTE — Progress Notes (Signed)
[name] Kitchen  PROGRESS NOTE    [name]  BMW:[id number] DOB: Mar 26, 1956 DOA: 01/09/2019 PCP: Mancel Bale, PA-C   Brief Narrative:   Cory Kelly is a 63 y.o. male with medical history significant of HTN, pancreatic cancer, bacteremia. He presents today w/ complaints of generalized weakness and falls. He reports multiple falls over the last several months. They generally do not involve chest pain, palpitations, dizziness, LOC. He reports that his most recent fall was 2 days ago, but this was due to tripping over his dog. He says that he generally feels unsteady on his feet.   He was following up with his oncology team today (history of pancreatic cancer) and was found to have elevated LFTs/bilirubin. They became concerned and spoke with GI. He was sent to the ED and evaluated by the GI team. They believe he may have a problem with his biliary stent and take him for ERCP in the morning.  Of note, he has had multiple recent admissions. He was recently diagnosed w/ strep bacteremia and candidemia. He was placed on long term IV abx.   Assessment & Plan:   Active Problems:   Essential hypertension   Hyperlipemia   Elevated LFTs   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[place])   Streptococcal bacteremia   Candidemia ([place])   Falls   Falls     - reports multiple falls over the last several months and a general feeling of being unsteady on his feet.      - most recent fall 2 nights ago; no LOC, head injury; Mechanism of fall was tripping over his moving dog     - films negative for Fx     - PT/OT to see  Elevated LFT/Hyperbilirubinemia     - history of pancreatic CA w/ biliary stent     - GI will take him for ERCP today.  Pancreatic CA     - Follows with Dr. Benay Spice     - s/p 7 cycles FOLFIRI     - they note a 21lb weight increase since 12/24/2018 and were scheduling him for paracentesis; consider completion after ERCP     - spoke with Dr. Benay Spice this AM, appreciate assistance; poor  prognosis, he will discuss with pt/family  Streptococcal bacteremia/Candidemia     - currently on ertapenem through 01/26/2019 and capsofungin through 02/09/2019; continue     - Heme/onc requesting review by ID, will speak with ID  Enterococcus/Coag-neg staph bacteremia     - labs drawn from picc     - pt started on vanc ON w/ pharm consult     - will speak with ID  HTN     - continue amlodipine, irbesartan    DVT prophylaxis: heparin Code Status: FULL   Disposition Plan: TBD   Consultants:   GI  Heme/onc  Procedures:   ERCP  Antimicrobials:  . Ertapenem, caspofungin, vancomycin    Subjective: "My stomach hurts."  Objective: Vitals:   01/09/19 1539 01/09/19 1803 01/10/19 0704 01/10/19 1140  BP: 132/84 124/72 138/88 137/86  Pulse: 87 91 84 87  Resp: 15 18 20 16   Temp:   98.2 F (36.8 C) 98.8 F (37.1 C)  TempSrc:   Oral Oral  SpO2: 97% 94% 97% 98%  Weight:    88.6 kg  Height:    6\' 5"  (1.956 m)    Intake/Output Summary (Last 24 hours) at 01/10/2019 1322 Last data filed at 01/10/2019 0300 Gross per 24 hour  Intake 230  ml  Output -  Net 230 ml   Filed Weights   01/10/19 1140  Weight: 88.6 kg    Examination:  General: 63 y.o. male resting in bed in NAD Cardiovascular: RRR, +S1, S2, no m/g/r, equal pulses throughout Respiratory: CTABL, no w/r/r, normal WOB GI: BS+, NT, slight distention, TTP RUQ, no masses noted, no organomegaly noted MSK: No e/c/c Skin: No rashes, ulcerations noted; multiple bruises noted on b/l UE, he is jaundiced Neuro: alert to name follows commands Psyc: Appropriate interaction and affect, calm/cooperative    Data Reviewed: I have personally reviewed following labs and imaging studies.  CBC: Recent Labs  Lab 01/06/19 2041 01/09/19 1223 01/10/19 0352  WBC 4.7 4.1 3.7*  NEUTROABS 4.0 3.6  --   HGB 10.1* 10.7* 8.7*  HCT 31.3* 33.5* 27.9*  MCV 87.7 87.9 88.3  PLT 71* 63* 59*   Basic Metabolic Panel: Recent Labs  Lab  01/04/19 0505 01/06/19 2041 01/09/19 1223 01/10/19 0352  NA  --  132* 130* 130*  K  --  3.8 4.0 4.2  CL  --  95* 95* 95*  CO2  --  29 27 26   GLUCOSE  --  165* 110* 101*  BUN  --  7* 7* 8  CREATININE 0.61 0.77 0.67 0.40*  CALCIUM  --  8.0* 8.0* 7.8*  MG  --   --  1.6* 1.8   GFR: Estimated Creatinine Clearance: 121.5 mL/min (A) (by C-G formula based on SCr of 0.4 mg/dL (L)). Liver Function Tests: Recent Labs  Lab 01/06/19 2041 01/09/19 1223 01/10/19 0352  AST 226* 268* 171*  ALT 94* 175* 133*  ALKPHOS 834* 1,031* 778*  BILITOT 3.3* 8.0* 9.0*  PROT 5.1* 5.0* 4.9*  ALBUMIN 2.5* 2.1* 2.1*   No results for input(s): LIPASE, AMYLASE in the last 168 hours. No results for input(s): AMMONIA in the last 168 hours. Coagulation Profile: Recent Labs  Lab 01/10/19 0352  INR 1.1   Cardiac Enzymes: No results for input(s): CKTOTAL, CKMB, CKMBINDEX, TROPONINI in the last 168 hours. BNP (last 3 results) No results for input(s): PROBNP in the last 8760 hours. HbA1C: No results for input(s): HGBA1C in the last 72 hours. CBG: No results for input(s): GLUCAP in the last 168 hours. Lipid Profile: No results for input(s): CHOL, HDL, LDLCALC, TRIG, CHOLHDL, LDLDIRECT in the last 72 hours. Thyroid Function Tests: No results for input(s): TSH, T4TOTAL, FREET4, T3FREE, THYROIDAB in the last 72 hours. Anemia Panel: No results for input(s): VITAMINB12, FOLATE, FERRITIN, TIBC, IRON, RETICCTPCT in the last 72 hours. Sepsis Labs: Recent Labs  Lab 01/06/19 2041  LATICACIDVEN 0.7    Recent Results (from the past 240 hour(s))  Culture, blood (routine x 2)     Status: None   Collection Time: 12/31/18  9:54 PM   Specimen: BLOOD  Result Value Ref Range Status   Specimen Description   Final    BLOOD LEFT ANTECUBITAL Performed at [facility] [address] [place], [place] [address]    Special Requests   Final    BOTTLES DRAWN AEROBIC AND ANAEROBIC Blood Culture  adequate volume Performed at [facility] [address] [place] [address] [place], [place] [address]    Culture   Final    NO GROWTH 5 DAYS Performed at [facility], [place] [address], [place], [place] [address]    Report Status 01/05/2019 FINAL  Final  Culture, blood (routine x 2)     Status: None  Collection Time: 12/31/18  9:54 PM   Specimen: BLOOD  Result Value Ref Range Status   Specimen Description   Final    BLOOD BLOOD RIGHT HAND Performed at [facility] [address] [place], [place] [address]    Special Requests   Final    BOTTLES DRAWN AEROBIC AND ANAEROBIC Blood Culture adequate volume Performed at [facility] [address] [place], [place] [address]    Culture   Final    NO GROWTH 5 DAYS Performed at [facility], [place] [address] [place], [place] [address]    Report Status 01/05/2019 FINAL  Final  C difficile quick scan w PCR reflex     Status: None   Collection Time: 01/01/19 11:33 AM   Specimen: STOOL  Result Value Ref Range Status   C Diff antigen NEGATIVE NEGATIVE Final   C Diff toxin NEGATIVE NEGATIVE Final   C Diff interpretation No C. difficile detected.  Final    Comment: Performed at [facility], [place] [address], [place], [place] [address]  Blood Culture (routine x 2)     Status: None (Preliminary result)   Collection Time: 01/06/19  8:41 PM   Specimen: BLOOD  Result Value Ref Range Status   Specimen Description   Final    BLOOD RIGHT WRIST Performed at [facility] [address], [place], [place] [address]    Special Requests   Final    BOTTLES DRAWN AEROBIC AND ANAEROBIC Blood Culture results may not be optimal due to an excessive volume of blood received in culture bottles Performed at [place] [address] [place], [place] [address]    Culture   Final    NO GROWTH 3 DAYS Performed at [facility], [place]  [address], [place], [place] [address]    Report Status PENDING  Incomplete  Blood Culture (routine x 2)     Status: None (Preliminary result)   Collection Time: 01/06/19  8:46 PM   Specimen: BLOOD  Result Value Ref Range Status   Specimen Description   Final    BLOOD LEFT WRIST Performed at [facility] [address] [place], [place] [address]    Special Requests   Final    BOTTLES DRAWN AEROBIC AND ANAEROBIC Blood Culture results may not be optimal due to an excessive volume of blood received in culture bottles Performed at [facility] [address], [place], [place] [address]    Culture   Final    NO GROWTH 3 DAYS Performed at [facility], [place] [address], [place], [place] [address]    Report Status PENDING  Incomplete  Culture, Blood     Status: None (Preliminary result)   Collection Time: 01/09/19 12:30 PM   Specimen: BLOOD LEFT HAND  Result Value Ref Range Status   Specimen Description   Final    BLOOD LEFT HAND Performed at [facility], [place] [address] [place], [place] [address]    Special Requests   Final    BOTTLES DRAWN AEROBIC AND ANAEROBIC Blood Culture results may not be optimal due to an inadequate volume of blood received in culture bottles   Culture   Final    NO GROWTH < 24 HOURS Performed at [facility] [address], [place],  [address]    Report Status PENDING  Incomplete  Culture, Blood  Status: None (Preliminary result)   Collection Time: 01/09/19  1:13 PM   Specimen: BLOOD  Result Value Ref Range Status   Specimen Description BLOOD PICC LINE  Final   Special Requests   Final    BOTTLES DRAWN AEROBIC AND ANAEROBIC Blood Culture adequate volume   Culture  Setup Time   Final    GRAM POSITIVE COCCI IN BOTH AEROBIC AND ANAEROBIC BOTTLES CRITICAL RESULT CALLED TO, READ BACK BY AND VERIFIED WITH: PHRMD B GREEN @0529  01/10/19 BY S GEZAHEGN Performed at [facility], [place] [address] [place], [place] [address]    Culture GRAM POSITIVE COCCI  Final   Report Status PENDING  Incomplete  Blood Culture ID Panel (Reflexed)     Status: Abnormal   Collection Time: 01/09/19  1:13 PM  Result Value Ref Range Status   Enterococcus species DETECTED (A) NOT DETECTED Final    Comment: CRITICAL RESULT CALLED TO, READ BACK BY AND VERIFIED WITH: PHRMD B GREEN @0529  01/10/19 BY S GEZAHEGN    Vancomycin resistance NOT DETECTED NOT DETECTED Final   Listeria monocytogenes NOT DETECTED NOT DETECTED Final   Staphylococcus species DETECTED (A) NOT DETECTED Final    Comment: Methicillin (oxacillin) resistant coagulase negative staphylococcus. Possible blood culture contaminant (unless isolated from more than one blood culture draw or clinical case suggests pathogenicity). No antibiotic treatment is indicated for blood  culture contaminants. CRITICAL RESULT CALLED TO, READ BACK BY AND VERIFIED WITH: PHRMD B GREEN @0529  01/10/19 BY S GEZAHEGN    Staphylococcus aureus (BCID) NOT DETECTED NOT DETECTED Final   Methicillin resistance DETECTED (A) NOT DETECTED Final    Comment: CRITICAL RESULT CALLED TO, READ BACK BY AND VERIFIED WITH: PHRMD B GREEN @0529  01/10/19 BY S GEZAHEGN    Streptococcus species NOT DETECTED NOT DETECTED Final   Streptococcus agalactiae NOT DETECTED NOT DETECTED Final   Streptococcus pneumoniae NOT DETECTED NOT DETECTED Final   Streptococcus pyogenes NOT DETECTED NOT DETECTED Final   Acinetobacter baumannii NOT DETECTED NOT DETECTED Final   Enterobacteriaceae species NOT DETECTED NOT DETECTED Final   Enterobacter cloacae complex NOT DETECTED NOT DETECTED Final   Escherichia coli NOT DETECTED NOT DETECTED Final   Klebsiella oxytoca NOT DETECTED NOT DETECTED Final   Klebsiella pneumoniae NOT DETECTED NOT DETECTED Final   Proteus species NOT DETECTED NOT DETECTED Final   Serratia marcescens NOT DETECTED NOT DETECTED Final   Haemophilus influenzae NOT DETECTED  NOT DETECTED Final   Neisseria meningitidis NOT DETECTED NOT DETECTED Final   Pseudomonas aeruginosa NOT DETECTED NOT DETECTED Final   Candida albicans NOT DETECTED NOT DETECTED Final   Candida glabrata NOT DETECTED NOT DETECTED Final   Candida krusei NOT DETECTED NOT DETECTED Final   Candida parapsilosis NOT DETECTED NOT DETECTED Final   Candida tropicalis NOT DETECTED NOT DETECTED Final    Comment: Performed at [facility], [place]. [address] [place],  [address]  SARS Coronavirus 2 (CEPHEID - Performed in [facility]), Hosp Order     Status: None   Collection Time: 01/09/19  6:07 PM   Specimen: Nasopharyngeal Swab  Result Value Ref Range Status   SARS Coronavirus 2 NEGATIVE NEGATIVE Final    Comment: (NOTE) If result is NEGATIVE SARS-CoV-2 target nucleic acids are NOT DETECTED. The SARS-CoV-2 RNA is generally detectable in upper and lower  respiratory specimens during the acute phase of infection. The lowest  concentration of SARS-CoV-2 viral copies this assay can detect is 250  copies /  mL. A negative result does not preclude SARS-CoV-2 infection  and should not be used as the sole basis for treatment or other  patient management decisions.  A negative result may occur with  improper specimen collection / handling, submission of specimen other  than nasopharyngeal swab, presence of viral mutation(s) within the  areas targeted by this assay, and inadequate number of viral copies  (<250 copies / mL). A negative result must be combined with clinical  observations, patient history, and epidemiological information. If result is POSITIVE SARS-CoV-2 target nucleic acids are DETECTED. The SARS-CoV-2 RNA is generally detectable in upper and lower  respiratory specimens dur ing the acute phase of infection.  Positive  results are indicative of active infection with SARS-CoV-2.  Clinical  correlation with patient history and other diagnostic information is   necessary to determine patient infection status.  Positive results do  not rule out bacterial infection or co-infection with other viruses. If result is PRESUMPTIVE POSTIVE SARS-CoV-2 nucleic acids MAY BE PRESENT.   A presumptive positive result was obtained on the submitted specimen  and confirmed on repeat testing.  While 2019 novel coronavirus  (SARS-CoV-2) nucleic acids may be present in the submitted sample  additional confirmatory testing may be necessary for epidemiological  and / or clinical management purposes  to differentiate between  SARS-CoV-2 and other Sarbecovirus currently known to infect humans.  If clinically indicated additional testing with an alternate test  methodology [id number]) is advised. The SARS-CoV-2 RNA is generally  detectable in upper and lower respiratory sp ecimens during the acute  phase of infection. The expected result is Negative. Fact Sheet for Patients:  StrictlyIdeas.no Fact Sheet for Healthcare Providers: BankingDealers.co.za This test is not yet approved or cleared by the [place] FDA and has been authorized for detection and/or diagnosis of SARS-CoV-2 by FDA under an Emergency Use Authorization (EUA).  This EUA will remain in effect (meaning this test can be used) for the duration of the COVID-19 declaration under Section 564(b)(1) of the Act, 21 U.S.C. section 360bbb-3(b)(1), unless the authorization is terminated or revoked sooner. Performed at [facility], [place] [address] [place], [place] [address]          Radiology Studies: Dg Ribs Unilateral W/chest Right  Result Date: 01/09/2019 CLINICAL DATA:  Fever, increasing ascites, fall several days prior, pain on right side chest. EXAM: RIGHT RIBS AND CHEST - 3+ VIEW COMPARISON:  Radiograph 01/06/2019, 12/28/2018 FINDINGS: Radiopaque marker placed at the level of the lateral right tenth rib, no subjacent fracture. No  visible displaced rib fractures or other acute osseous abnormality. Degenerative changes in the spine and shoulders. Right upper extremity PICC tip terminates at the superior cavoatrial junction. Surgical clips and TIPS catheter noted in the right upper quadrant. No acute soft tissue abnormality. Trace right pleural effusion and bandlike areas of subsegmental atelectasis the right lung base. Additional retrocardiac opacity is similar to prior exam. Lungs otherwise clear. Cardiomediastinal contours are unremarkable. IMPRESSION: No visible displaced rib fracture or other acute osseous abnormality. Right basilar and retrocardiac opacities could reflect atelectasis though developing consolidation could have a similar appearance given setting of fever. Trace right effusion. Electronically Signed   By: MD Lovena Le   On: 01/09/2019 16:15   Ct Abdomen Pelvis W Contrast  Result Date: 01/09/2019 CLINICAL DATA:  Fall, low back pain, pancreatic cancer with biliary stent placement EXAM: CT ABDOMEN AND PELVIS WITH CONTRAST TECHNIQUE: Multidetector CT imaging of the abdomen and pelvis was performed  using the standard protocol following bolus administration of intravenous contrast. CONTRAST:  142mL OMNIPAQUE IOHEXOL 300 MG/ML  SOLN COMPARISON:  12/24/2018 FINDINGS: Lower chest: Small to moderate bilateral pleural effusions and associated atelectasis or consolidation, new compared to prior examination. Coronary artery calcifications. Hepatobiliary: No solid liver abnormality is seen. Redemonstrated common bile duct stent with slightly increased biliary ductal dilatation and redemonstrated post stenting pneumobilia. No change in appearance or configuration of stent. Pancreas: Primary pancreatic mass is not clearly appreciated on this examination, with atrophy and ductal dilatation of the pancreatic body and tail. Spleen: Splenomegaly, maximum coronal span 19.3 cm. Adrenals/Urinary Tract: Adrenal glands are unremarkable.  Nonobstructive bilateral nephrolithiasis. No hydronephrosis. Bladder is unremarkable. Stomach/Bowel: Stomach is within normal limits. Appendix appears normal. No evidence of bowel wall thickening, distention, or inflammatory changes. Vascular/Lymphatic: Aortic atherosclerosis. No enlarged abdominal or pelvic lymph nodes. Reproductive: No mass or other significant abnormality. Other: No abdominal wall hernia or abnormality. Moderate to large volume ascites, which is increased compared to prior examination. Anasarca. Musculoskeletal: There are mildly displaced fracture heads of the right tenth and eleventh ribs as well as the right transverse process of the T10 vertebral body. IMPRESSION: 1. There are mildly displaced fracture heads of the right tenth and eleventh ribs as well as the right transverse process of the T10 vertebral body. 2. Redemonstrated common bile duct stent with slightly increased biliary ductal dilatation and redemonstrated post stenting pneumobilia. No change in appearance or configuration of stent. Primary pancreatic mass is not clearly appreciated on this examination, with atrophy and ductal dilatation of the pancreatic body and tail. 3. Pleural effusions, ascites, and anasarca, new and increased compared to prior examination. Electronically Signed   By: Eddie Candle M.D.   On: 01/09/2019 16:52        Scheduled Meds: . [MAR Hold] amLODipine  10 mg Oral QHS  . [MAR Hold] doxazosin  1 mg Oral QHS  . [MAR Hold] gabapentin  200 mg Oral BID  . [MAR Hold] heparin  5,000 Units Subcutaneous Q8H  . [MAR Hold] indomethacin  100 mg Rectal Once  . [MAR Hold] irbesartan  300 mg Oral Daily  . [MAR Hold] magnesium oxide  400 mg Oral BID  . [MAR Hold] potassium chloride SA  20 mEq Oral BID  . [MAR Hold] sodium chloride flush  10-40 mL Intracatheter Q12H   Continuous Infusions: . sodium chloride    . [MAR Hold] anidulafungin Stopped (01/10/19 0019)  . [MAR Hold] ertapenem Stopped (01/09/19  2159)  . lactated ringers 1,000 mL (01/10/19 1147)     LOS: 1 day    Time spent: 35 minutes spent in the coordination of care today.    Jonnie Finner, DO [facility] Pager [phone]  If 7PM-7AM, please contact night-coverage [email or web address] Password [email or web address] 01/10/2019, 1:22 PM

## 2019-01-10 NOTE — Transfer of Care (Signed)
Immediate Anesthesia Transfer of Care Note  Patient: Cory Kelly  Procedure(s) Performed: ENDOSCOPIC RETROGRADE CHOLANGIOPANCREATOGRAPHY (ERCP) (N/A )  Patient Location: PACU and Endoscopy Unit  Anesthesia Type:General  Level of Consciousness: awake, alert  and oriented  Airway & Oxygen Therapy: Patient Spontanous Breathing and Patient connected to face mask oxygen  Post-op Assessment: Report given to RN and Post -op Vital signs reviewed and stable  Post vital signs: Reviewed and stable  Last Vitals:  Vitals Value Taken Time  BP 115/73 01/10/19 1430  Temp    Pulse 85 01/10/19 1434  Resp 14 01/10/19 1434  SpO2 100 % 01/10/19 1434  Vitals shown include unvalidated device data.  Last Pain:  Vitals:   01/10/19 1424  TempSrc: Tympanic  PainSc: 0-No pain         Complications: No apparent anesthesia complications

## 2019-01-10 NOTE — Progress Notes (Signed)
PT Cancellation Note  Patient Details Name: Cory Kelly MRN: [id number] DOB: [birth year]   Cancelled Treatment:    Reason Eval/Treat Not Completed: Patient at procedure or test/unavailable   Rosaura Bolon,KATHrine E 01/10/2019, 11:57 AM Carmelia Bake, PT, DPT Acute Rehabilitation Services Office: [phone] Pager: [phone]

## 2019-01-10 NOTE — Interval H&P Note (Signed)
History and Physical Interval Note:  01/10/2019 12:44 PM  Cory Kelly  has presented today for surgery, with the diagnosis of Biliary stent occlusion/biliary obstruction.  The various methods of treatment have been discussed with the patient and family. After consideration of risks, benefits and other options for treatment, the patient has consented to  Procedure(s): ENDOSCOPIC RETROGRADE CHOLANGIOPANCREATOGRAPHY (ERCP) (N/A) as a surgical intervention.  The patient's history has been reviewed, patient examined, no change in status, stable for surgery.  I have reviewed the patient's chart and labs.  Questions were answered to the patient's satisfaction.     Milus Banister

## 2019-01-10 NOTE — Op Note (Signed)
[facility] Patient Name: Cory Kelly Procedure Date: 01/10/2019 MRN: [id number] Attending MD: Milus Banister , MD Date of Birth: 01/16/1956 CSN: [id number] Age: 63 Admit Type: Inpatient Procedure:                ERCP Indications:              Common hepatic duct stone(s) Providers:                Milus Banister, MD, Cleda Daub, RN, Marguerita Merles, Technician Referring MD:              Medicines:                General Anesthesia, Indomethacin 024 mg PR Complications:            No immediate complications. Estimated blood loss:                            None Estimated Blood Loss:     Estimated blood loss: none. Procedure:                Pre-Anesthesia Assessment:                           - Prior to the procedure, a History and Physical                            was performed, and patient medications and                            allergies were reviewed. The patient's tolerance of                            previous anesthesia was also reviewed. The risks                            and benefits of the procedure and the sedation                            options and risks were discussed with the patient.                            All questions were answered, and informed consent                            was obtained. Prior Anticoagulants: The patient has                            taken no previous anticoagulant or antiplatelet                            agents. ASA Grade Assessment: III - A patient with  severe systemic disease. After reviewing the risks                            and benefits, the patient was deemed in                            satisfactory condition to undergo the procedure.                           After obtaining informed consent, the scope was                            passed under direct vision. Throughout the                            procedure, the patient's blood  pressure, pulse, and                            oxygen saturations were monitored continuously. The                            TJF-Q180V ([id number]) Olympus duodenoscope was                            introduced through the mouth, and used to inject                            contrast into and used to inject contrast into the                            bile duct. The ERCP was accomplished without                            difficulty. The patient tolerated the procedure                            well. Scope In: Scope Out: Findings:      The duodenoscope was advanced to the region of the major papilla without       detailed examination of the UGI tract. The edema in the duodenal bulb,       sweep is more significant than several weeks ago and limited easy       passage of the duodenoscope. With gentle pressure however I was able to       advance deep enough. The previously placed (8cm long uncovered SEMS)       showed extensive friable tumor ingrowth and biodebris. I used a 84       Autotome over a .035 hydrawire to cannulate the biliary stent and       injected contrast. Cholangiogram showed significant effective luminal       narrowing in the distal 1/2 of the stent. Proximal to the stent the       intrahepatic bile ducts never filled well with contrast. I used a 9-3mm       biliary balloon to clean out copious tissue, blood clot, biodebris from  the stent and occlusion cholangiogram showed marked improvent in the       lumen through the stent. I elected to place a second stent within the       first to attempt keep tumor ingrowth from progressing so quickly again;       a 6cm long 74mm diameter fully covered SEMS was placed overlapping with       the previous stent. The distal end of the new stent extends about 1/2cm       more deeply into the duodenum than the previous stent (see images). The       main pancreatic duct was never injected with dye or cannulated with a        wire. Impression:               - Extensive tumor ingrowth, biodebris within the                            previously placed 8cm long uncovered SEMS. This was                            cleared with a biliary balloon and then and then a                            second stent (6cm long, fully covered SEMS) was                            placed within the first to attempt to decrease the                            recurrence of tumor ingrowth which has been causing                            stent occlusions.                           - Duodenum edema is more significant than                            previously noted. Moderate Sedation:      Not Applicable - Patient had care per Anesthesia. Recommendation:           - Return patient to hospital ward for ongoing care.                           - Follow clinically. Procedure Code(s):        --- Professional ---                           [phone], Endoscopic retrograde                            cholangiopancreatography (ERCP); with placement of                            endoscopic stent into biliary or pancreatic duct,  including pre- and post-dilation and guide wire                            passage, when performed, including sphincterotomy,                            when performed, each stent Diagnosis Code(s):        --- Professional ---                           K80.50, Calculus of bile duct without cholangitis                            or cholecystitis without obstruction CPT copyright 2019 [employer]. All rights reserved. The codes documented in this report are preliminary and upon coder review may  be revised to meet current compliance requirements. Milus Banister, MD 01/10/2019 2:43:49 PM This report has been signed electronically. Number of Addenda: 0

## 2019-01-11 LAB — CBC WITH DIFFERENTIAL/PLATELET
Abs Immature Granulocytes: 0.01 10*3/uL (ref 0.00–0.07)
Basophils Absolute: 0 10*3/uL (ref 0.0–0.1)
Basophils Relative: 0 %
Eosinophils Absolute: 0 10*3/uL (ref 0.0–0.5)
Eosinophils Relative: 0 %
HCT: 26.2 % — ABNORMAL LOW (ref 39.0–52.0)
Hemoglobin: 8.1 g/dL — ABNORMAL LOW (ref 13.0–17.0)
Immature Granulocytes: 0 %
Lymphocytes Relative: 7 %
Lymphs Abs: 0.2 10*3/uL — ABNORMAL LOW (ref 0.7–4.0)
MCH: 27.4 pg (ref 26.0–34.0)
MCHC: 30.9 g/dL (ref 30.0–36.0)
MCV: 88.5 fL (ref 80.0–100.0)
Monocytes Absolute: 0.2 10*3/uL (ref 0.1–1.0)
Monocytes Relative: 9 %
Neutro Abs: 2 10*3/uL (ref 1.7–7.7)
Neutrophils Relative %: 84 %
Platelets: 61 10*3/uL — ABNORMAL LOW (ref 150–400)
RBC: 2.96 MIL/uL — ABNORMAL LOW (ref 4.22–5.81)
RDW: 15.7 % — ABNORMAL HIGH (ref 11.5–15.5)
WBC: 2.5 10*3/uL — ABNORMAL LOW (ref 4.0–10.5)
nRBC: 0 % (ref 0.0–0.2)

## 2019-01-11 LAB — PHOSPHORUS: Phosphorus: 3.4 mg/dL (ref 2.5–4.6)

## 2019-01-11 LAB — COMPREHENSIVE METABOLIC PANEL
ALT: 100 U/L — ABNORMAL HIGH (ref 0–44)
AST: 98 U/L — ABNORMAL HIGH (ref 15–41)
Albumin: 1.9 g/dL — ABNORMAL LOW (ref 3.5–5.0)
Alkaline Phosphatase: 749 U/L — ABNORMAL HIGH (ref 38–126)
Anion gap: 7 (ref 5–15)
BUN: 10 mg/dL (ref 8–23)
CO2: 28 mmol/L (ref 22–32)
Calcium: 7.8 mg/dL — ABNORMAL LOW (ref 8.9–10.3)
Chloride: 101 mmol/L (ref 98–111)
Creatinine, Ser: 0.51 mg/dL — ABNORMAL LOW (ref 0.61–1.24)
GFR calc Af Amer: >60 mL/min (ref >60)
GFR calc non Af Amer: >60 mL/min (ref >60)
Glucose, Bld: 115 mg/dL — ABNORMAL HIGH (ref 70–99)
Potassium: 4.1 mmol/L (ref 3.5–5.1)
Sodium: 136 mmol/L (ref 135–145)
Total Bilirubin: 7.1 mg/dL — ABNORMAL HIGH (ref 0.3–1.2)
Total Protein: 4.4 g/dL — ABNORMAL LOW (ref 6.5–8.1)

## 2019-01-11 LAB — URINALYSIS, ROUTINE W REFLEX MICROSCOPIC
Glucose, UA: 50 mg/dL — AB
Hgb urine dipstick: NEGATIVE
Ketones, ur: 5 mg/dL — AB
Leukocytes,Ua: NEGATIVE
Nitrite: NEGATIVE
Protein, ur: NEGATIVE mg/dL
Specific Gravity, Urine: 1.009 (ref 1.005–1.030)
pH: 6 (ref 5.0–8.0)

## 2019-01-11 LAB — MAGNESIUM: Magnesium: 2 mg/dL (ref 1.7–2.4)

## 2019-01-11 MED ORDER — SODIUM CHLORIDE 0.9 % IV SOLN
INTRAVENOUS | Status: DC | PRN
  Administered 2019-01-11 – 2019-01-12 (×3): 250 mL via INTRAVENOUS

## 2019-01-11 NOTE — Evaluation (Signed)
Occupational Therapy Evaluation Patient Details Name: Cory Kelly MRN: [id number] DOB: 08/18/1955 Today's Date: 01/11/2019    History of Present Illness pt admitted after a fall and weakness. H/O metastatic pancreatic CA, stent, and HTN   Clinical Impression   This 63 year old man was admitted for the above.  He has been independent with adls.  Pt had a couple of falls and presents with weakness and sore R side of trunk, which limits ADLs. He needed max A for LB dressing and min guard for ambulating with RW.  Will follow in acute setting with supervision level goals    Follow Up Recommendations  Supervision/Assistance - 24 hour    Equipment Recommendations  (tba further, ? 3:1)    Recommendations for Other Services       Precautions / Restrictions Precautions Precautions: Fall Restrictions Weight Bearing Restrictions: No      Mobility Bed Mobility Overal bed mobility: Modified Independent             General bed mobility comments: increased time and HOB raised  Transfers Overall transfer level: Needs assistance Equipment used: Rolling walker (2 wheeled) Transfers: Sit to/from Stand Sit to Stand: Min guard         General transfer comment: for safety, slow transition, painful R side, bed elevated    Balance Overall balance assessment: History of Falls                                         ADL either performed or assessed with clinical judgement   ADL Overall ADL's : Needs assistance/impaired             Lower Body Bathing: Moderate assistance(simulated)       Lower Body Dressing: Maximal assistance Lower Body Dressing Details (indicate cue type and reason): started pants over legs in bed at pt's request.  Sat EOB and stood to pull up.  Mod A for pants, max A overall Toilet Transfer: Min guard;Ambulation;RW(chair)             General ADL Comments: pt needs mostly set up/supervision for UB adls.  Limited by pain for LB  adls. Pt was getting on/off standard commode and he is 6'5", wil continue to assess for toilet DME     Vision         Perception     Praxis      Pertinent Vitals/Pain Pain Assessment: Faces Faces Pain Scale: Hurts little more Pain Location: R side Pain Descriptors / Indicators: Grimacing Pain Intervention(s): Limited activity within patient's tolerance;Monitored during session;Repositioned     Hand Dominance Right   Extremity/Trunk Assessment Upper Extremity Assessment Upper Extremity Assessment: Overall WFL for tasks assessed           Communication Communication Communication: No difficulties   Cognition Arousal/Alertness: Awake/alert Behavior During Therapy: WFL for tasks assessed/performed Overall Cognitive Status: Within Functional Limits for tasks assessed                                 General Comments: appears wfls, has been confused per chart.  Thought he had broken ribs   General Comments  pt stated he is losing strength as he hasn't been walking. Walked about 46' with RW and min guard for safety:  told him PT would be by later to walk with him.  Encouraged small burst of activity. Pt agreeable to sitting in chair at end of session    Exercises     Shoulder Instructions      Home Living Family/patient expects to be discharged to:: Private residence Living Arrangements: Spouse/significant other Available Help at Discharge: Family               Bathroom Shower/Tub: Occupational psychologist: Standard     Home Equipment: None   Additional Comments: pt is 6'5"      Prior Functioning/Environment Level of Independence: Independent                 OT Problem List: Decreased strength;Decreased activity tolerance;Pain;Decreased knowledge of use of DME or AE      OT Treatment/Interventions: Self-care/ADL training;Energy conservation;DME and/or AE instruction;Patient/family education;Balance training;Therapeutic  activities    OT Goals(Current goals can be found in the care plan section) Acute Rehab OT Goals Patient Stated Goal: get strength back OT Goal Formulation: With patient Time For Goal Achievement: 01/25/19 Potential to Achieve Goals: Good ADL Goals Pt Will Transfer to Toilet: (P) with supervision(regular commode vs 3:1 over this) Pt Will Perform Toileting - Clothing Manipulation and hygiene: (P) with supervision;sit to/from stand Additional ADL Goal #1: (P) if pt is interested in AE, he will perform LB bathing and don pants at supervision level with this Additional ADL Goal #2: (P) pt will initiate rest break for energy conservation  OT Frequency: Min 2X/week   Barriers to D/C:            Co-evaluation              AM-PAC OT "6 Clicks" Daily Activity     Outcome Measure Help from another person eating meals?: None Help from another person taking care of personal grooming?: A Little Help from another person toileting, which includes using toliet, bedpan, or urinal?: A Little Help from another person bathing (including washing, rinsing, drying)?: A Lot Help from another person to put on and taking off regular upper body clothing?: A Little Help from another person to put on and taking off regular lower body clothing?: A Lot 6 Click Score: 17   End of Session    Activity Tolerance: Patient tolerated treatment well Patient left: in chair;with call bell/phone within reach;with chair alarm set  OT Visit Diagnosis: Unsteadiness on feet (R26.81);Muscle weakness (generalized) (M62.81)                Time: 6301-6010 OT Time Calculation (min): 25 min Charges:  OT General Charges $OT Visit: 1 Visit OT Evaluation $OT Eval Low Complexity: 1 Low OT Treatments $Self Care/Home Management : 8-22 mins  Lesle Chris, OTR/L Acute Rehabilitation Services [phone] [facility] pager [phone] office 01/11/2019  [place] 01/11/2019, 2:09 PM

## 2019-01-11 NOTE — Progress Notes (Signed)
[name] Kitchen  PROGRESS NOTE    Cory Kelly  ACZ:[id number] DOB: 09-15-1955 DOA: 01/09/2019 PCP: Mancel Bale, PA-C   Brief Narrative:   Cory Kelly a 63 y.o.malewith medical history significant ofHTN, pancreatic cancer, bacteremia. He presents today w/ complaints of generalized weakness and falls. He reports multiple falls over the last several months. They generally do not involve chest pain, palpitations, dizziness, LOC. He reports that his most recent fall was 2 days ago, but this was due to tripping over his dog. He says that he generally feels unsteady on his feet.   He was following up with his oncology team today (history of pancreatic cancer) and was found to have elevated LFTs/bilirubin. They became concerned and spoke with GI. He was sent to the ED and evaluated by the GI team. They believe he may have a problem with his biliary stent and take him for ERCP in the morning.  Of note, he has had multiple recent admissions. He was recently diagnosed w/ strep bacteremia and candidemia. He was placed on long term IV abx.   Assessment & Plan:   Active Problems:   Essential hypertension   Hyperlipemia   Elevated LFTs   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[name])   Streptococcal bacteremia   Candidemia ([place])   Falls   Bile duct obstruction   Falls - reports multiple falls over the last several months and a general feeling of being unsteady on his feet.  - most recent fall 2 nights ago; no LOC, head injury; Mechanism of fall was tripping over his moving dog - films negative for Fx     - PT/OT recs pending  Elevated LFT/Hyperbilirubinemia - history of pancreatic CA w/ biliary stent - s/p ERCP: see GI note; stent occluded, stented the stent  Pancreatic CA - Follows with Dr. Benay Spice - s/p 7 cycles FOLFIRI - they note a 21lb weight increase since 12/24/2018 and were scheduling him for paracentesis; consider completion after ERCP     - onco  onboard     - poor prognosis; family would like to speak with palliative care, they have been consulted     - now s/p paracentesis as well  Streptococcal bacteremia/Candidemia - currently on ertapenem through 01/26/2019 and capsofungin through 02/09/2019; continue     - Heme/onc requesting review by ID, will speak with ID     - appreciate ID assistance; abx transitioned  Enterococcus/Coag-neg staph bacteremia     - labs drawn from picc     - pt started on vanc ON w/ pharm consult     - will speak with ID     - Appreciate ID assistance; will need PICC holiday  HTN - continue amlodipine, irbesartan    DVT prophylaxis: heparin Code Status: FULL  Family Communication: Spoke with wife by phone   Disposition Plan: TBD   Consultants:   GI  ID  Oncology  Procedures:   ERCP  Paracentesis  Antimicrobials:   Eraxis, merrem    Subjective: "So what are we looking at?"  Objective: Vitals:   01/10/19 1640 01/10/19 2030 01/11/19 0458 01/11/19 1300  BP: 124/78 119/81 115/79 (!) 147/81  Pulse:  68 68 (!) 105  Resp:  20 20 20   Temp:  97.7 F (36.5 C) (!) 97.5 F (36.4 C) 99 F (37.2 C)  TempSrc:  Oral Oral Oral  SpO2:  97% 95% 94%  Weight:      Height:        Intake/Output Summary (Last  24 hours) at 01/11/2019 1404 Last data filed at 01/11/2019 0600 Gross per 24 hour  Intake 1844.48 ml  Output 710 ml  Net 1134.48 ml   Filed Weights   01/10/19 1140  Weight: 88.6 kg    Examination:  General:62 y.o.maleresting in bed in NAD Cardiovascular: RRR, +S1, S2, no m/g/r, equal pulses throughout Respiratory: CTABL, no w/r/r, normal WOB GI: BS+, ND, TTP RUQ,no masses noted, no organomegaly noted MSK: No e/c/c Skin: No rashes, ulcerations noted; multiple bruises noted on b/l UE, he is jaundiced Neuro: alert to name follows commands Psyc: Appropriate interaction and affect, calm/cooperative     Data Reviewed: I have personally reviewed following labs  and imaging studies.  CBC: Recent Labs  Lab 01/06/19 2041 01/09/19 1223 01/10/19 0352 01/11/19 0430  WBC 4.7 4.1 3.7* 2.5*  NEUTROABS 4.0 3.6  --  2.0  HGB 10.1* 10.7* 8.7* 8.1*  HCT 31.3* 33.5* 27.9* 26.2*  MCV 87.7 87.9 88.3 88.5  PLT 71* 63* 59* 61*   Basic Metabolic Panel: Recent Labs  Lab 01/06/19 2041 01/09/19 1223 01/10/19 0352 01/11/19 0430  NA 132* 130* 130* 136  K 3.8 4.0 4.2 4.1  CL 95* 95* 95* 101  CO2 29 27 26 28   GLUCOSE 165* 110* 101* 115*  BUN 7* 7* 8 10  CREATININE 0.77 0.67 0.40* 0.51*  CALCIUM 8.0* 8.0* 7.8* 7.8*  MG  --  1.6* 1.8 2.0  PHOS  --   --   --  3.4   GFR: Estimated Creatinine Clearance: 121.5 mL/min (A) (by C-G formula based on SCr of 0.51 mg/dL (L)). Liver Function Tests: Recent Labs  Lab 01/06/19 2041 01/09/19 1223 01/10/19 0352 01/11/19 0430  AST 226* 268* 171* 98*  ALT 94* 175* 133* 100*  ALKPHOS 834* 1,031* 778* 749*  BILITOT 3.3* 8.0* 9.0* 7.1*  PROT 5.1* 5.0* 4.9* 4.4*  ALBUMIN 2.5* 2.1* 2.1* 1.9*   No results for input(s): LIPASE, AMYLASE in the last 168 hours. No results for input(s): AMMONIA in the last 168 hours. Coagulation Profile: Recent Labs  Lab 01/10/19 0352  INR 1.1   Cardiac Enzymes: No results for input(s): CKTOTAL, CKMB, CKMBINDEX, TROPONINI in the last 168 hours. BNP (last 3 results) No results for input(s): PROBNP in the last 8760 hours. HbA1C: No results for input(s): HGBA1C in the last 72 hours. CBG: No results for input(s): GLUCAP in the last 168 hours. Lipid Profile: No results for input(s): CHOL, HDL, LDLCALC, TRIG, CHOLHDL, LDLDIRECT in the last 72 hours. Thyroid Function Tests: No results for input(s): TSH, T4TOTAL, FREET4, T3FREE, THYROIDAB in the last 72 hours. Anemia Panel: No results for input(s): VITAMINB12, FOLATE, FERRITIN, TIBC, IRON, RETICCTPCT in the last 72 hours. Sepsis Labs: Recent Labs  Lab 01/06/19 2041  LATICACIDVEN 0.7    Recent Results (from the past 240  hour(s))  Blood Culture (routine x 2)     Status: None (Preliminary result)   Collection Time: 01/06/19  8:41 PM   Specimen: BLOOD  Result Value Ref Range Status   Specimen Description   Final    BLOOD RIGHT WRIST Performed at [facility] [address] [place], [place] [address]    Special Requests   Final    BOTTLES DRAWN AEROBIC AND ANAEROBIC Blood Culture results may not be optimal due to an excessive volume of blood received in culture bottles Performed at [facility] [address], [place],  [address]    Culture   Final  NO GROWTH 4 DAYS Performed at [facility], [place] [address], [place], [place] [address]    Report Status PENDING  Incomplete  Blood Culture (routine x 2)     Status: None (Preliminary result)   Collection Time: 01/06/19  8:46 PM   Specimen: BLOOD  Result Value Ref Range Status   Specimen Description   Final    BLOOD LEFT WRIST Performed at [facility] [address] [place], [place] [address]    Special Requests   Final    BOTTLES DRAWN AEROBIC AND ANAEROBIC Blood Culture results may not be optimal due to an excessive volume of blood received in culture bottles Performed at [facility] [address], [place], [place] [address]    Culture   Final    NO GROWTH 4 DAYS Performed at [facility], [place] [address], [place], [place] [address]    Report Status PENDING  Incomplete  Culture, Blood     Status: None (Preliminary result)   Collection Time: 01/09/19 12:30 PM   Specimen: BLOOD LEFT HAND  Result Value Ref Range Status   Specimen Description   Final    BLOOD LEFT HAND Performed at [facility], [place] [address], [place], [place] [address]    Special Requests   Final    BOTTLES DRAWN AEROBIC AND ANAEROBIC Blood Culture results may not be optimal due to an inadequate volume of blood received in culture bottles   Culture    Final    NO GROWTH 2 DAYS Performed at [facility], [place] [address], [place], [place] [address]    Report Status PENDING  Incomplete  Culture, Blood     Status: None (Preliminary result)   Collection Time: 01/09/19  1:13 PM   Specimen: BLOOD  Result Value Ref Range Status   Specimen Description BLOOD PICC LINE  Final   Special Requests   Final    BOTTLES DRAWN AEROBIC AND ANAEROBIC Blood Culture adequate volume   Culture  Setup Time   Final    GRAM POSITIVE COCCI IN BOTH AEROBIC AND ANAEROBIC BOTTLES CRITICAL RESULT CALLED TO, READ BACK BY AND VERIFIED WITH: PHRMD B GREEN @0529  01/10/19 BY S GEZAHEGN    Culture   Final    GRAM POSITIVE COCCI IDENTIFICATION AND SUSCEPTIBILITIES TO FOLLOW CULTURE REINCUBATED FOR BETTER GROWTH Performed at [facility], [place] [address], [place],  [address]    Report Status PENDING  Incomplete  Blood Culture ID Panel (Reflexed)     Status: Abnormal   Collection Time: 01/09/19  1:13 PM  Result Value Ref Range Status   Enterococcus species DETECTED (A) NOT DETECTED Final    Comment: CRITICAL RESULT CALLED TO, READ BACK BY AND VERIFIED WITH: PHRMD B GREEN @0529  01/10/19 BY S GEZAHEGN    Vancomycin resistance NOT DETECTED NOT DETECTED Final   Listeria monocytogenes NOT DETECTED NOT DETECTED Final   Staphylococcus species DETECTED (A) NOT DETECTED Final    Comment: Methicillin (oxacillin) resistant coagulase negative staphylococcus. Possible blood culture contaminant (unless isolated from more than one blood culture draw or clinical case suggests pathogenicity). No antibiotic treatment is indicated for blood  culture contaminants. CRITICAL RESULT CALLED TO, READ BACK BY AND VERIFIED WITH: PHRMD B GREEN @0529  01/10/19 BY S GEZAHEGN    Staphylococcus aureus (BCID) NOT DETECTED NOT DETECTED Final   Methicillin resistance DETECTED (A) NOT DETECTED Final    Comment: CRITICAL RESULT CALLED TO,  READ BACK BY AND VERIFIED WITH: PHRMD B GREEN @0529   01/10/19 BY S GEZAHEGN    Streptococcus species NOT DETECTED NOT DETECTED Final   Streptococcus agalactiae NOT DETECTED NOT DETECTED Final   Streptococcus pneumoniae NOT DETECTED NOT DETECTED Final   Streptococcus pyogenes NOT DETECTED NOT DETECTED Final   Acinetobacter baumannii NOT DETECTED NOT DETECTED Final   Enterobacteriaceae species NOT DETECTED NOT DETECTED Final   Enterobacter cloacae complex NOT DETECTED NOT DETECTED Final   Escherichia coli NOT DETECTED NOT DETECTED Final   Klebsiella oxytoca NOT DETECTED NOT DETECTED Final   Klebsiella pneumoniae NOT DETECTED NOT DETECTED Final   Proteus species NOT DETECTED NOT DETECTED Final   Serratia marcescens NOT DETECTED NOT DETECTED Final   Haemophilus influenzae NOT DETECTED NOT DETECTED Final   Neisseria meningitidis NOT DETECTED NOT DETECTED Final   Pseudomonas aeruginosa NOT DETECTED NOT DETECTED Final   Candida albicans NOT DETECTED NOT DETECTED Final   Candida glabrata NOT DETECTED NOT DETECTED Final   Candida krusei NOT DETECTED NOT DETECTED Final   Candida parapsilosis NOT DETECTED NOT DETECTED Final   Candida tropicalis NOT DETECTED NOT DETECTED Final    Comment: Performed at [facility], [place] [address] [place], [place] [address]  SARS Coronavirus 2 (CEPHEID - Performed in [facility] lab), Hosp Order     Status: None   Collection Time: 01/09/19  6:07 PM   Specimen: Nasopharyngeal Swab  Result Value Ref Range Status   SARS Coronavirus 2 NEGATIVE NEGATIVE Final    Comment: (NOTE) If result is NEGATIVE SARS-CoV-2 target nucleic acids are NOT DETECTED. The SARS-CoV-2 RNA is generally detectable in upper and lower  respiratory specimens during the acute phase of infection. The lowest  concentration of SARS-CoV-2 viral copies this assay can detect is 250  copies / mL. A negative result does not preclude SARS-CoV-2 infection  and should not be used as the sole basis for treatment or other  patient management  decisions.  A negative result may occur with  improper specimen collection / handling, submission of specimen other  than nasopharyngeal swab, presence of viral mutation(s) within the  areas targeted by this assay, and inadequate number of viral copies  (<250 copies / mL). A negative result must be combined with clinical  observations, patient history, and epidemiological information. If result is POSITIVE SARS-CoV-2 target nucleic acids are DETECTED. The SARS-CoV-2 RNA is generally detectable in upper and lower  respiratory specimens dur ing the acute phase of infection.  Positive  results are indicative of active infection with SARS-CoV-2.  Clinical  correlation with patient history and other diagnostic information is  necessary to determine patient infection status.  Positive results do  not rule out bacterial infection or co-infection with other viruses. If result is PRESUMPTIVE POSTIVE SARS-CoV-2 nucleic acids MAY BE PRESENT.   A presumptive positive result was obtained on the submitted specimen  and confirmed on repeat testing.  While 2019 novel coronavirus  (SARS-CoV-2) nucleic acids may be present in the submitted sample  additional confirmatory testing may be necessary for epidemiological  and / or clinical management purposes  to differentiate between  SARS-CoV-2 and other Sarbecovirus currently known to infect humans.  If clinically indicated additional testing with an alternate test  methodology [phone]) is advised. The SARS-CoV-2 RNA is generally  detectable in upper and lower respiratory sp ecimens during the acute  phase of infection. The expected result is Negative. Fact Sheet for Patients:  StrictlyIdeas.no Fact Sheet for  Healthcare Providers: BankingDealers.co.za This test is not yet approved or cleared by the [place] and has been authorized for detection and/or diagnosis of SARS-CoV-2 by FDA under an  Emergency Use Authorization (EUA).  This EUA will remain in effect (meaning this test can be used) for the duration of the COVID-19 declaration under Section 564(b)(1) of the Act, 21 U.S.C. section 360bbb-3(b)(1), unless the authorization is terminated or revoked sooner. Performed at [facility], [place] [address], [place], [place] [address]   Body fluid culture     Status: None (Preliminary result)   Collection Time: 01/10/19  4:16 PM   Specimen: Peritoneal Fluid  Result Value Ref Range Status   Specimen Description   Final    PERITONEAL Performed at [facility] [address], [place], [place] [address]    Special Requests   Final    NONE Performed at [facility], [place] [address] [place], [place] [address]    Gram Stain   Final    FEW WBC PRESENT,BOTH PMN AND MONONUCLEAR NO ORGANISMS SEEN    Culture   Final    NO GROWTH < 12 HOURS Performed at [facility], [place] [address] [place],  [address]    Report Status PENDING  Incomplete         Radiology Studies: Dg Ribs Unilateral W/chest Right  Result Date: 01/09/2019 CLINICAL DATA:  Fever, increasing ascites, fall several days prior, pain on right side chest. EXAM: RIGHT RIBS AND CHEST - 3+ VIEW COMPARISON:  Radiograph 01/06/2019, 12/28/2018 FINDINGS: Radiopaque marker placed at the level of the lateral right tenth rib, no subjacent fracture. No visible displaced rib fractures or other acute osseous abnormality. Degenerative changes in the spine and shoulders. Right upper extremity PICC tip terminates at the superior cavoatrial junction. Surgical clips and TIPS catheter noted in the right upper quadrant. No acute soft tissue abnormality. Trace right pleural effusion and bandlike areas of subsegmental atelectasis the right lung base. Additional retrocardiac opacity is similar to prior exam. Lungs otherwise clear. Cardiomediastinal contours are unremarkable.  IMPRESSION: No visible displaced rib fracture or other acute osseous abnormality. Right basilar and retrocardiac opacities could reflect atelectasis though developing consolidation could have a similar appearance given setting of fever. Trace right effusion. Electronically Signed   By: MD Lovena Le   On: 01/09/2019 16:15   Ct Abdomen Pelvis W Contrast  Result Date: 01/09/2019 CLINICAL DATA:  Fall, low back pain, pancreatic cancer with biliary stent placement EXAM: CT ABDOMEN AND PELVIS WITH CONTRAST TECHNIQUE: Multidetector CT imaging of the abdomen and pelvis was performed using the standard protocol following bolus administration of intravenous contrast. CONTRAST:  116mL OMNIPAQUE IOHEXOL 300 MG/ML  SOLN COMPARISON:  12/24/2018 FINDINGS: Lower chest: Small to moderate bilateral pleural effusions and associated atelectasis or consolidation, new compared to prior examination. Coronary artery calcifications. Hepatobiliary: No solid liver abnormality is seen. Redemonstrated common bile duct stent with slightly increased biliary ductal dilatation and redemonstrated post stenting pneumobilia. No change in appearance or configuration of stent. Pancreas: Primary pancreatic mass is not clearly appreciated on this examination, with atrophy and ductal dilatation of the pancreatic body and tail. Spleen: Splenomegaly, maximum coronal span 19.3 cm. Adrenals/Urinary Tract: Adrenal glands are unremarkable. Nonobstructive bilateral nephrolithiasis. No hydronephrosis. Bladder is unremarkable. Stomach/Bowel: Stomach is within normal limits. Appendix appears normal. No evidence of bowel wall thickening, distention, or inflammatory changes. Vascular/Lymphatic: Aortic atherosclerosis. No enlarged abdominal or pelvic lymph nodes. Reproductive: No mass  or other significant abnormality. Other: No abdominal wall hernia or abnormality. Moderate to large volume ascites, which is increased compared to prior examination. Anasarca.  Musculoskeletal: There are mildly displaced fracture heads of the right tenth and eleventh ribs as well as the right transverse process of the T10 vertebral body. IMPRESSION: 1. There are mildly displaced fracture heads of the right tenth and eleventh ribs as well as the right transverse process of the T10 vertebral body. 2. Redemonstrated common bile duct stent with slightly increased biliary ductal dilatation and redemonstrated post stenting pneumobilia. No change in appearance or configuration of stent. Primary pancreatic mass is not clearly appreciated on this examination, with atrophy and ductal dilatation of the pancreatic body and tail. 3. Pleural effusions, ascites, and anasarca, new and increased compared to prior examination. Electronically Signed   By: Eddie Candle M.D.   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 Request is made for diagnostic and therapeutic paracentesis. EXAM: ULTRASOUND GUIDED DIAGNOSTIC AND THERAPEUTIC PARACENTESIS MEDICATIONS: 10 mL 1% lidocaine COMPLICATIONS: None immediate. PROCEDURE: Informed written consent was obtained from the patient after a discussion of the risks, benefits and alternatives to treatment. A timeout was performed prior to the initiation of the procedure. Initial ultrasound scanning demonstrates a moderate amount of ascites within the right lower abdominal quadrant. The right lower abdomen was prepped and draped in the usual sterile fashion. 1% lidocaine was used for local anesthesia. Following this, a 19 gauge, 7-cm, Yueh catheter was introduced. An ultrasound image was saved for documentation purposes. The paracentesis was performed. The catheter was removed and a dressing was applied. The patient tolerated the procedure well without immediate post procedural complication. FINDINGS: A total of approximately 3.35 L of clear gold fluid was  removed. Samples were sent to the laboratory as requested by the clinical team. IMPRESSION: Successful ultrasound-guided paracentesis yielding 3.35 L of peritoneal fluid. Read by: Earley Abide, PA-C Electronically Signed   By: Corrie Mckusick D.O.   On: 01/10/2019 16:46   Dg Ercp  Result Date: 01/10/2019 CLINICAL DATA:  ERCP with biliary stent placement. EXAM: ERCP TECHNIQUE: Multiple spot images obtained with the fluoroscopic device and submitted for interpretation post-procedure. FLUOROSCOPY TIME:  3 minutes, 17 seconds (131 mGy) COMPARISON:  CT abdomen pelvis-01/09/2019 FINDINGS: 6 spot intraoperative fluoroscopic images of the right upper abdominal quadrant during ERCP are provided for review. Initial image demonstrates an ERCP probe overlying the right upper abdominal quadrant. Overlapping metallic internal biliary stents overlie the expected location of the CBD. Surgical clips overlie the expected location of the pancreatic head. Subsequent images demonstrate selective cannulation and opacification of the biliary stents. There is suboptimal opacification the mid and distal aspects of the biliary stent, potentially secondary to subtotal occlusion. Subsequent images demonstrate presumed biliary sweeping and clearing of the biliary stents. There is faint opacification of the intrahepatic biliary tree which appears nondilated. IMPRESSION: ERCP with presumed internal biliary stent sleeping/clearing. These images were submitted for radiologic interpretation only. Please see the procedural report for the amount of contrast and the fluoroscopy time utilized. Electronically Signed   By: Sandi Mariscal M.D.   On: 01/10/2019 14:39        Scheduled Meds:  amLODipine  10 mg Oral QHS   doxazosin  1 mg Oral QHS   gabapentin  200 mg Oral BID   heparin  5,000 Units Subcutaneous Q8H   indomethacin  100 mg Rectal  Once   irbesartan  300 mg Oral Daily   magnesium oxide  400 mg Oral BID   potassium chloride  SA  20 mEq Oral BID   sodium chloride flush  10-40 mL Intracatheter Q12H   Continuous Infusions:  sodium chloride 20 mL/hr at 01/11/19 0600   anidulafungin Stopped (01/10/19 2333)   lactated ringers 1,000 mL (01/10/19 1147)   meropenem (MERREM) IV Stopped (01/11/19 0543)     LOS: 2 days    Time spent: 35 minutes spent in coordination of care today.     Jonnie Finner, DO [facility] Pager [phone]  If 7PM-7AM, please contact night-coverage [email or web address] Password [email or web address] 01/11/2019, 2:04 PM

## 2019-01-11 NOTE — Progress Notes (Addendum)
HEMATOLOGY-ONCOLOGY PROGRESS NOTE  SUBJECTIVE:  Less confused this morning.  Still has abdominal discomfort but less bloating.  ERCP showed extensive tumor ingrowth into the stent.  He underwent a paracentesis on 01/10/2019.  3.35 L removed.  Fluid has been sent for cytology.  Culture negative to date.  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[place])   08/31/2017 - 11/22/2017 Chemotherapy   The patient had palonosetron (ALOXI) injection 0.25 mg, 0.25 mg, Intravenous,  Once, 6 of 6 cycles Administration: 0.25 mg (08/31/2017), 0.25 mg (09/14/2017), 0.25 mg (09/28/2017), 0.25 mg (10/12/2017), 0.25 mg (10/26/2017), 0.25 mg (11/09/2017) pegfilgrastim-cbqv (UDENYCA) injection 6 mg, 6 mg, Subcutaneous, Once, 6 of 6 cycles Administration: 6 mg (09/02/2017), 6 mg (09/16/2017), 6 mg (09/30/2017), 6 mg (10/14/2017), 6 mg (10/28/2017), 6 mg (11/11/2017) irinotecan (CAMPTOSAR) 360 mg in sodium chloride 0.9 % 500 mL chemo infusion, 150 mg/m2 = 360 mg (100 % of original dose 150 mg/m2), Intravenous,  Once, 6 of 6 cycles Dose modification: 150 mg/m2 (original dose 150 mg/m2, Cycle 1, Reason: Provider Judgment) Administration: 360 mg (08/31/2017), 360 mg (09/14/2017), 360 mg (09/28/2017), 340 mg (10/12/2017), 340 mg (10/26/2017), 340 mg (11/09/2017) leucovorin 952 mg in sodium chloride 0.9 % 250 mL infusion, 400 mg/m2 = 952 mg, Intravenous,  Once, 6 of 6 cycles Administration: 952 mg (08/31/2017), 952 mg (09/14/2017), 952 mg (09/28/2017), 888 mg (10/12/2017), 888 mg (10/26/2017), 888 mg (11/09/2017) oxaliplatin (ELOXATIN) 200 mg in dextrose 5 % 500 mL chemo infusion, 85 mg/m2 = 200 mg, Intravenous,  Once, 6 of 6 cycles Dose modification: 65 mg/m2 (original dose 85 mg/m2, Cycle 4, Reason: Provider Judgment) Administration: 200 mg (08/31/2017), 200 mg (09/14/2017), 200 mg (09/28/2017), 145 mg (10/12/2017), 150 mg (10/26/2017), 150 mg (11/09/2017) fosaprepitant (EMEND)  150 mg, dexamethasone (DECADRON) 12 mg in sodium chloride 0.9 % 145 mL IVPB, , Intravenous,  Once, 6 of 6 cycles Administration:  (08/31/2017),  (09/14/2017),  (09/28/2017),  (10/12/2017),  (10/26/2017),  (11/09/2017) fluorouracil (ADRUCIL) 5,700 mg in sodium chloride 0.9 % 136 mL chemo infusion, 2,400 mg/m2 = 5,700 mg, Intravenous, 1 Day/Dose, 6 of 6 cycles Administration: 5,700 mg (08/31/2017), 5,700 mg (09/14/2017), 5,700 mg (09/28/2017), 5,350 mg (10/12/2017), 5,350 mg (10/26/2017), 5,350 mg (11/09/2017)  for chemotherapy treatment.    12/06/2017 - 02/28/2018 Chemotherapy   The patient had gemcitabine (GEMZAR) 1,786 mg in sodium chloride 0.9 % 250 mL chemo infusion, 800 mg/m2 = 1,786 mg (80 % of original dose 1,000 mg/m2), Intravenous,  Once, 4 of 4 cycles Dose modification: 800 mg/m2 (original dose 1,000 mg/m2, Cycle 1, Reason: Provider Judgment), 800 mg/m2 (original dose 1,000 mg/m2, Cycle 1, Reason: Provider Judgment, Comment: call from MD to decrease dose due to counts) Administration: 1,786 mg (12/20/2017), 1,786 mg (12/06/2017), 1,786 mg (01/03/2018), 1,786 mg (01/17/2018), 1,786 mg (01/31/2018), 1,786 mg (02/14/2018), 1,786 mg (02/28/2018) PACLitaxel-protein bound (ABRAXANE) chemo infusion 225 mg, 100 mg/m2 = 225 mg (80 % of original dose 125 mg/m2), Intravenous, Once, 3 of 3 cycles Dose modification: 100 mg/m2 (original dose 125 mg/m2, Cycle 1, Reason: Provider Judgment), 100 mg/m2 (original dose 125 mg/m2, Cycle 1, Reason: Provider Judgment, Comment: call from MD to decrease dose based on counts) Administration: 225 mg (12/20/2017), 225 mg (12/06/2017), 225 mg (01/03/2018), 225 mg (01/17/2018)  for chemotherapy treatment.    07/04/2018 Cancer Staging   Staging form: Exocrine Pancreas, AJCC 8th Edition - Pathologic: Stage IV (cM1) - Signed by Ladell Pier,  MD on 07/04/2018   08/21/2018 -  Chemotherapy   The patient had palonosetron (ALOXI) injection 0.25 mg, 0.25 mg, Intravenous,  Once, 7 of 8 cycles Administration:  0.25 mg (08/21/2018), 0.25 mg (09/03/2018), 0.25 mg (09/18/2018), 0.25 mg (10/01/2018), 0.25 mg (10/16/2018), 0.25 mg (11/14/2018), 0.25 mg (12/10/2018) pegfilgrastim-cbqv (UDENYCA) injection 6 mg, 6 mg, Subcutaneous, Once, 7 of 8 cycles Administration: 6 mg (08/23/2018), 6 mg (09/05/2018), 6 mg (09/20/2018), 6 mg (10/03/2018), 6 mg (10/18/2018), 6 mg (11/16/2018) irinotecan (CAMPTOSAR) 360 mg in dextrose 5 % 500 mL chemo infusion, 150 mg/m2 = 360 mg (100 % of original dose 150 mg/m2), Intravenous,  Once, 7 of 8 cycles Dose modification: 150 mg/m2 (original dose 150 mg/m2, Cycle 1, Reason: Provider Judgment), 112.5 mg/m2 (original dose 150 mg/m2, Cycle 1, Reason: Change in LFTs), 90 mg/m2 (original dose 150 mg/m2, Cycle 6, Reason: Provider Judgment), 90 mg/m2 (original dose 90 mg/m2, Cycle 7) Administration: 260 mg (08/21/2018), 260 mg (09/03/2018), 260 mg (09/18/2018), 260 mg (10/01/2018), 200 mg (11/14/2018), 200 mg (10/16/2018), 200 mg (12/10/2018) leucovorin 952 mg in dextrose 5 % 250 mL infusion, 400 mg/m2 = 952 mg, Intravenous,  Once, 7 of 8 cycles Dose modification: 300 mg/m2 (original dose 400 mg/m2, Cycle 1, Reason: Provider Judgment), 300 mg/m2 (original dose 300 mg/m2, Cycle 7) Administration: 696 mg (08/21/2018), 696 mg (09/03/2018), 696 mg (09/18/2018), 676 mg (10/01/2018), 676 mg (11/14/2018), 676 mg (10/16/2018), 676 mg (12/10/2018) oxaliplatin (ELOXATIN) 155 mg in dextrose 5 % 500 mL chemo infusion, 65 mg/m2 = 155 mg (100 % of original dose 65 mg/m2), Intravenous,  Once, 7 of 8 cycles Dose modification: 65 mg/m2 (original dose 65 mg/m2, Cycle 1, Reason: Provider Judgment) Administration: 150 mg (08/21/2018), 150 mg (09/03/2018), 150 mg (09/18/2018), 150 mg (10/01/2018), 150 mg (10/16/2018), 150 mg (11/14/2018), 150 mg (12/10/2018) fosaprepitant (EMEND) 150 mg, dexamethasone (DECADRON) 12 mg in sodium chloride 0.9 % 145 mL IVPB, , Intravenous,  Once, 7 of 8 cycles Administration:  (08/21/2018),  (09/03/2018),  (09/18/2018),   (10/01/2018),  (10/16/2018),  (11/14/2018),  (12/10/2018) fluorouracil (ADRUCIL) 5,700 mg in sodium chloride 0.9 % 136 mL chemo infusion, 2,400 mg/m2 = 5,700 mg, Intravenous, 1 Day/Dose, 7 of 8 cycles Dose modification: 1,800 mg/m2 (original dose 2,400 mg/m2, Cycle 1, Reason: Change in LFTs) Administration: 4,200 mg (08/21/2018), 4,200 mg (09/03/2018), 4,200 mg (09/18/2018), 4,050 mg (10/01/2018), 4,050 mg (10/16/2018), 4,050 mg (11/14/2018), 4,050 mg (12/10/2018)  for chemotherapy treatment.      REVIEW OF SYSTEMS:   Constitutional: No recurrent fevers or chills in the past 24 hours.. Eyes: Denies blurriness of vision Ears, nose, mouth, throat, and face: Denies mucositis or sore throat Respiratory: Denies cough, dyspnea or wheezes Cardiovascular: Denies palpitation, chest discomfort Gastrointestinal: Reports abdominal bloating and abdominal discomfort which has improved.Denies nausea, vomiting, constipation, diarrhea. Skin: Denies abnormal skin rashes Lymphatics: Denies new lymphadenopathy or easy bruising Neurological:Denies numbness, tingling or new weaknesses Behavioral/Psych: Mood is stable, no new changes  Extremities: Reports lower extremity edema. All other systems were reviewed with the patient and are negative.  I have reviewed the past medical history, past surgical history, social history and family history with the patient and they are unchanged from previous note.   PHYSICAL EXAMINATION:  Vitals:   01/10/19 2030 01/11/19 0458  BP: 119/81 115/79  Pulse: 68 68  Resp: 20 20  Temp: 97.7 F (36.5 C) (!) 97.5 F (36.4 C)  SpO2: 97% 95%   Filed Weights   01/10/19 1140  Weight: 195 lb 5.2 oz (88.6  kg)    Intake/Output from previous day: 07/09 0701 - 07/10 0700 In: 1844.5 [P.O.:390; I.V.:1119.5; IV Piggyback:335] Out: 710 [Urine:700; Blood:10]  GENERAL:alert, no distress EYES: scleral icterus noted OROPHARYNX: no thrush or mucositis NECK: supple, thyroid normal size,  non-tender, without nodularity LYMPH:  no palpable lymphadenopathy in the cervical, axillary or inguinal LUNGS: clear to auscultation and percussion with normal breathing effort HEART: regular rate & rhythm and no murmurs and trace lower extremity edema ABDOMEN: Positive bowel sounds.  Minimal ascites noted.   Musculoskeletal:no cyanosis of digits and no clubbing  NEURO: alert & oriented x 3 with fluent speech, no focal motor/sensory deficits  LABORATORY DATA:  I have reviewed the data as listed CMP Latest Ref Rng & Units 01/11/2019 01/10/2019 01/09/2019  Glucose 70 - 99 mg/dL 115(H) 101(H) 110(H)  BUN 8 - 23 mg/dL 10 8 7(L)  Creatinine 0.61 - 1.24 mg/dL 0.51(L) 0.40(L) 0.67  Sodium 135 - 145 mmol/L 136 130(L) 130(L)  Potassium 3.5 - 5.1 mmol/L 4.1 4.2 4.0  Chloride 98 - 111 mmol/L 101 95(L) 95(L)  CO2 22 - 32 mmol/L _0 Calcium 8.9 - 10.3 mg/dL 7.8(L) 7.8(L) 8.0(L)  Total Protein 6.5 - 8.1 g/dL 4.4(L) 4.9(L) 5.0(L)  Total Bilirubin 0.3 - 1.2 mg/dL 7.1(H) 9.0(H) 8.0(HH)  Alkaline Phos 38 - 126 U/L 749(H) 778(H) 1,031(H)  AST 15 - 41 U/L 98(H) 171(H) 268(HH)  ALT 0 - 44 U/L 100(H) 133(H) 175(H)    Lab Results  Component Value Date   WBC 2.5 (L) 01/11/2019   HGB 8.1 (L) 01/11/2019   HCT 26.2 (L) 01/11/2019   MCV 88.5 01/11/2019   PLT 61 (L) 01/11/2019   NEUTROABS 2.0 01/11/2019    Dg Chest 2 View  Result Date: 12/28/2018 CLINICAL DATA:  Sepsis EXAM: CHEST - 2 VIEW COMPARISON:  December 24, 2018 FINDINGS: The right-sided Port-A-Cath has been removed. There is a new right-sided PICC line with tip terminating over the distal SVC. The cardiac size is stable. There is no pneumothorax. No large pleural effusion. The lung volumes are slightly low. IMPRESSION: No active cardiopulmonary disease. Electronically Signed   By: Constance Holster M.D.   On: 12/28/2018 18:53   Dg Ribs Unilateral W/chest Right  Result Date: 01/09/2019 CLINICAL DATA:  Fever, increasing ascites, fall several days  prior, pain on right side chest. EXAM: RIGHT RIBS AND CHEST - 3+ VIEW COMPARISON:  Radiograph 01/06/2019, 12/28/2018 FINDINGS: Radiopaque marker placed at the level of the lateral right tenth rib, no subjacent fracture. No visible displaced rib fractures or other acute osseous abnormality. Degenerative changes in the spine and shoulders. Right upper extremity PICC tip terminates at the superior cavoatrial junction. Surgical clips and TIPS catheter noted in the right upper quadrant. No acute soft tissue abnormality. Trace right pleural effusion and bandlike areas of subsegmental atelectasis the right lung base. Additional retrocardiac opacity is similar to prior exam. Lungs otherwise clear. Cardiomediastinal contours are unremarkable. IMPRESSION: No visible displaced rib fracture or other acute osseous abnormality. Right basilar and retrocardiac opacities could reflect atelectasis though developing consolidation could have a similar appearance given setting of fever. Trace right effusion. Electronically Signed   By: MD Lovena Le   On: 01/09/2019 16:15   Ct Abdomen Pelvis W Contrast  Result Date: 01/09/2019 CLINICAL DATA:  Fall, low back pain, pancreatic cancer with biliary stent placement EXAM: CT ABDOMEN AND PELVIS WITH CONTRAST TECHNIQUE: Multidetector CT imaging of the abdomen and pelvis was performed using the standard protocol  following bolus administration of intravenous contrast. CONTRAST:  157m OMNIPAQUE IOHEXOL 300 MG/ML  SOLN COMPARISON:  12/24/2018 FINDINGS: Lower chest: Small to moderate bilateral pleural effusions and associated atelectasis or consolidation, new compared to prior examination. Coronary artery calcifications. Hepatobiliary: No solid liver abnormality is seen. Redemonstrated common bile duct stent with slightly increased biliary ductal dilatation and redemonstrated post stenting pneumobilia. No change in appearance or configuration of stent. Pancreas: Primary pancreatic mass is not  clearly appreciated on this examination, with atrophy and ductal dilatation of the pancreatic body and tail. Spleen: Splenomegaly, maximum coronal span 19.3 cm. Adrenals/Urinary Tract: Adrenal glands are unremarkable. Nonobstructive bilateral nephrolithiasis. No hydronephrosis. Bladder is unremarkable. Stomach/Bowel: Stomach is within normal limits. Appendix appears normal. No evidence of bowel wall thickening, distention, or inflammatory changes. Vascular/Lymphatic: Aortic atherosclerosis. No enlarged abdominal or pelvic lymph nodes. Reproductive: No mass or other significant abnormality. Other: No abdominal wall hernia or abnormality. Moderate to large volume ascites, which is increased compared to prior examination. Anasarca. Musculoskeletal: There are mildly displaced fracture heads of the right tenth and eleventh ribs as well as the right transverse process of the T10 vertebral body. IMPRESSION: 1. There are mildly displaced fracture heads of the right tenth and eleventh ribs as well as the right transverse process of the T10 vertebral body. 2. Redemonstrated common bile duct stent with slightly increased biliary ductal dilatation and redemonstrated post stenting pneumobilia. No change in appearance or configuration of stent. Primary pancreatic mass is not clearly appreciated on this examination, with atrophy and ductal dilatation of the pancreatic body and tail. 3. Pleural effusions, ascites, and anasarca, new and increased compared to prior examination. Electronically Signed   By: AEddie CandleM.D.   On: 01/09/2019 16:52   Ct Abdomen Pelvis W Contrast  Result Date: 12/24/2018 CLINICAL DATA:  Abdominal distension, fever, history of pancreatic cancer EXAM: CT ABDOMEN AND PELVIS WITH CONTRAST TECHNIQUE: Multidetector CT imaging of the abdomen and pelvis was performed using the standard protocol following bolus administration of intravenous contrast. CONTRAST:  1057mOMNIPAQUE IOHEXOL 300 MG/ML  SOLN  COMPARISON:  MR abdomen pelvis, 12/04/2018, 10/30/2018 FINDINGS: Lower chest: No acute abnormality. Hepatobiliary: Known liver metastases are poorly appreciated by single-phase CT, for example in the dome of the liver (series 2, image 14). No gallstones or gallbladder wall thickening. Common bile duct stent is positioned tip in the descending portion of the duodenum with post stenting pneumobilia. Pancreas: Discrete pancreatic head mass is not well appreciated, primarily appreciated by mass effect on the confluence of the portal vein (series 2, image 38. There is atrophy of the pancreatic body and tail and dilatation pancreatic duct. Spleen: Splenomegaly. Adrenals/Urinary Tract: Adrenal glands are unremarkable. Nonobstructive bilateral nephrolithiasis. Bladder is unremarkable. Stomach/Bowel: Stomach is within normal limits. Appendix is not clearly visualized. No evidence of bowel wall thickening, distention, or inflammatory changes. Large burden of stool in the distal colon and rectum. Vascular/Lymphatic: Aortic atherosclerosis. No enlarged abdominal or pelvic lymph nodes. Reproductive: No mass or other significant abnormality. Other: No abdominal wall hernia or abnormality. Moderate volume ascites. Musculoskeletal: No acute or significant osseous findings. IMPRESSION: 1. Redemonstrated pancreatic malignancy with hepatic metastases, status post common bile duct stenting. Primary mass and hepatic metastases are better appreciated by recent multiphasic MRI. 2.  Moderate volume ascites, which is similar to prior MRI. 3.  Splenomegaly. 4.  Large burden of stool in the distal colon and rectum. 5.  Nonobstructive bilateral nephrolithiasis. Electronically Signed   By: AlDorna Bloom.  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 Request is made for diagnostic and therapeutic paracentesis. EXAM:  ULTRASOUND GUIDED DIAGNOSTIC AND THERAPEUTIC PARACENTESIS MEDICATIONS: 10 mL 1% lidocaine COMPLICATIONS: None immediate. PROCEDURE: Informed written consent was obtained from the patient after a discussion of the risks, benefits and alternatives to treatment. A timeout was performed prior to the initiation of the procedure. Initial ultrasound scanning demonstrates a moderate amount of ascites within the right lower abdominal quadrant. The right lower abdomen was prepped and draped in the usual sterile fashion. 1% lidocaine was used for local anesthesia. Following this, a 19 gauge, 7-cm, Yueh catheter was introduced. An ultrasound image was saved for documentation purposes. The paracentesis was performed. The catheter was removed and a dressing was applied. The patient tolerated the procedure well without immediate post procedural complication. FINDINGS: A total of approximately 3.35 L of clear gold fluid was removed. Samples were sent to the laboratory as requested by the clinical team. IMPRESSION: Successful ultrasound-guided paracentesis yielding 3.35 L of peritoneal fluid. Read by: Earley Abide, PA-C Electronically Signed   By: Corrie Mckusick D.O.   On: 01/10/2019 16:46   US Paracentesis  Result Date: 12/31/2018 INDICATION: Patient with history of metastatic pancreatic cancer, ascites. Request made for diagnostic and therapeutic paracentesis. EXAM: ULTRASOUND GUIDED DIAGNOSTIC AND THERAPEUTIC PARACENTESIS MEDICATIONS: None COMPLICATIONS: None immediate. PROCEDURE: Informed written consent was obtained from the patient after a discussion of the risks, benefits and alternatives to treatment. A timeout was performed prior to the initiation of the procedure. Initial ultrasound scanning demonstrates a small to moderate amount of ascites within the right upper to mid abdominal quadrant. The right upper to mid abdomen was prepped and draped in the usual sterile fashion. 1% lidocaine was used for local anesthesia.  Following this, a 19 gauge, 7-cm, Yueh catheter was introduced. An ultrasound image was saved for documentation purposes. The paracentesis was performed. The catheter was removed and a dressing was applied. The patient tolerated the procedure well without immediate post procedural complication. FINDINGS: A total of approximately 2.6 liters of slightly hazy, light yellow fluid was removed. Samples were sent to the laboratory as requested by the clinical team. IMPRESSION: Successful ultrasound-guided diagnostic and therapeutic paracentesis yielding 2.6 liters of peritoneal fluid. Read by: Rowe Robert, PA-C Electronically Signed   By: Sandi Mariscal M.D.   On: 12/31/2018 12:04   Ir Removal [employer] W/ [place] W/o Fl Mod Sed  Result Date: 12/27/2018 CLINICAL DATA:  Bacteremia. Indwelling right IJ port catheter placed 08/30/2017 by Dr. Pascal Lux, has been working well without evident complication , removal requested EXAM: EXAM TUNNELED PORT CATHETER REMOVAL TECHNIQUE: The procedure, risks (including but not limited to bleeding, infection, organ damage ), benefits, and alternatives were explained to the patient. Questions regarding the procedure were encouraged and answered. The patient understands and consents to the procedure. Intravenous Fentanyl 183mg and Versed 473mwere administered as conscious sedation during continuous monitoring of the patient's level of consciousness and physiological / cardiorespiratory status by the radiology RN, with a total moderate sedation time of 37 minutes. Overlying skin prepped with chlorhexidine, draped in usual sterile fashion, infiltrated locally with 1% lidocaine. A small incision was made over the scar from previous placement. The port catheter was dissected free from the underlying soft tissues and removed intact. There is no gross purulence. Hemostasis was achieved. The port pocket was closed with deep interrupted and subcuticular continuous  3-0 Monocryl sutures, then covered with  Dermabond. The patient tolerated the procedure well. COMPLICATIONS: COMPLICATIONS None immediate IMPRESSION: 1.  Technically successful tunneled Port catheter removal. Electronically Signed   By: Lucrezia Europe M.D.   On: 12/27/2018 16:14   Dg Chest Portable 1 View  Result Date: 01/06/2019 CLINICAL DATA:  Patient with fever EXAM: PORTABLE CHEST 1 VIEW COMPARISON:  Chest radiograph 12/28/2018 FINDINGS: Right upper extremity PICC line tip projects over the superior vena cava. Monitoring leads overlie the patient. Stable cardiac and mediastinal contours. Elevation of the right hemidiaphragm. Retrocardiac consolidative opacity. No pleural effusion or pneumothorax. IMPRESSION: Retrocardiac consolidative opacity which may represent atelectasis or pneumonia. Electronically Signed   By: Lovey Newcomer M.D.   On: 01/06/2019 21:02   Dg Chest Port 1 View  Result Date: 12/28/2018 CLINICAL DATA:  Sepsis hypertension EXAM: PORTABLE CHEST 1 VIEW COMPARISON:  12/28/2018, 12/24/2018 FINDINGS: Right upper extremity catheter tip overlies the SVC. No acute consolidation or effusion. Normal cardiomediastinal silhouette. No pneumothorax. IMPRESSION: No active disease. Electronically Signed   By: Donavan Foil M.D.   On: 12/28/2018 23:03   Dg Chest Portable 1 View  Result Date: 12/24/2018 CLINICAL DATA:  Fever and weakness. EXAM: PORTABLE CHEST 1 VIEW COMPARISON:  11/04/2018 FINDINGS: The cardiac silhouette, mediastinal and hilar contours are stable. Mild cardiac enlargement and mild tortuosity of the thoracic aorta. The right IJ power port is stable. The lungs are clear. No pleural effusions. The bony thorax is intact. IMPRESSION: No acute cardiopulmonary findings. Electronically Signed   By: Marijo Sanes M.D.   On: 12/24/2018 13:59   Dg Ercp  Result Date: 01/10/2019 CLINICAL DATA:  ERCP with biliary stent placement. EXAM: ERCP TECHNIQUE: Multiple spot images obtained with the fluoroscopic device and submitted for interpretation  post-procedure. FLUOROSCOPY TIME:  3 minutes, 17 seconds (131 mGy) COMPARISON:  CT abdomen pelvis-01/09/2019 FINDINGS: 6 spot intraoperative fluoroscopic images of the right upper abdominal quadrant during ERCP are provided for review. Initial image demonstrates an ERCP probe overlying the right upper abdominal quadrant. Overlapping metallic internal biliary stents overlie the expected location of the CBD. Surgical clips overlie the expected location of the pancreatic head. Subsequent images demonstrate selective cannulation and opacification of the biliary stents. There is suboptimal opacification the mid and distal aspects of the biliary stent, potentially secondary to subtotal occlusion. Subsequent images demonstrate presumed biliary sweeping and clearing of the biliary stents. There is faint opacification of the intrahepatic biliary tree which appears nondilated. IMPRESSION: ERCP with presumed internal biliary stent sleeping/clearing. These images were submitted for radiologic interpretation only. Please see the procedural report for the amount of contrast and the fluoroscopy time utilized. Electronically Signed   By: Sandi Mariscal M.D.   On: 01/10/2019 14:39   [place] Ascites (abdomen Limited)  Result Date: 12/25/2018 CLINICAL DATA:  63 year old male with abdominal distension, ascites EXAM: LIMITED ABDOMEN ULTRASOUND FOR ASCITES TECHNIQUE: Limited ultrasound survey for ascites was performed in all four abdominal quadrants. COMPARISON:  Prior CT scan of the abdomen and pelvis 12/24/2018 FINDINGS: Sonographic interrogation was performed of the 4 quadrants of the abdomen. There is trace perihepatic fluid in the right upper quadrant with multiple small loops of bowel. Overall, fluid volume is low. No safe window for paracentesis. IMPRESSION: Small volume ascites, insufficient for paracentesis. Electronically Signed   By: Jacqulynn Cadet M.D.   On: 12/25/2018 12:13   Ir Picc Placement Right >5 Yrs Inc Img  Guide  Result Date: 12/27/2018 CLINICAL DATA:  Bacteremia, needs venous  access for planned chemotherapy Monday EXAM: PICC PLACEMENT WITH ULTRASOUND AND FLUOROSCOPY FLUOROSCOPY TIME:  6 seconds, 1 mGy TECHNIQUE: After written informed consent was obtained, patient was placed in the supine position on angiographic table. Patency of the right basilic vein was confirmed with ultrasound with image documentation. An appropriate skin site was determined. Skin site was marked. Region was prepped using maximum barrier technique including cap and mask, sterile gown, sterile gloves, large sterile sheet, and Chlorhexidine as cutaneous antisepsis. The region was infiltrated locally with 1% lidocaine. Under real-time ultrasound guidance, the right basilic vein was accessed with a 21 gauge micropuncture needle; the needle tip within the vein was confirmed with ultrasound image documentation. Needle exchanged over a 018 guidewire for a peel-away sheath, through which a 5-French single-lumen power injectable PICC trimmed to 46cm was advanced, positioned with its tip near the cavoatrial junction. Spot chest radiograph confirms appropriate catheter position. Catheter was flushed per protocol and secured externally. The patient tolerated procedure well. COMPLICATIONS: COMPLICATIONS none IMPRESSION: 1. Technically successful five [place] single lumen power injectable PICC placement Electronically Signed   By: Lucrezia Europe M.D.   On: 12/27/2018 16:15   [place] Ekg Site Rite  Result Date: 01/04/2019 If Site Rite image not attached, placement could not be confirmed due to current cardiac rhythm.   ASSESSMENT AND PLAN: 1. Pancreas head mass  CT abdomen/pelvis 08/03/2017-fullness in the head of the pancreas with stranding in the peripancreatic fat and pancreatic ductal dilatation.   MRI abdomen 08/04/2017-findings favored to represent acute on chronic pancreatitis; equivocal soft tissue fullness within the head and uncinate process of the  pancreas; indeterminate too small to characterize right hepatic lobe lesion; small volume abdominal ascites; splenomegaly.   CA-19-9 elevated at 977on 08/04/2017.  Status postupper EUS 08/10/2017-findings of an irregular masslike region in the pancreatic head measuring approximately 2.7 cm. This directly abutted the main portal vein but no other major vascular structures. Biopsies obtained with preliminary cytology positive for malignancy, likely adenocarcinoma. The final report is pending. The common bile duct and main pancreatic duct were both dilated. ERCP was then proceeded with forstent placement. Multiple attempts were made tocannulate the bile duct without success. The procedure was aborted.  08/16/2017 ERCP with placement of a metal biliary stent in the common bile duct by Dr. Francella Solian at [facility].  Cycle 1 FOLFIRINOX 08/31/2017  Cycle 2 FOLFIRINOX 09/14/2017  Cycle 3 FOLFIRINOX 09/28/2017  Cycle 4 FOLFIRINOX 10/12/2017 (oxaliplatin dose reduced secondary to thrombocytopenia)  Cycle 5 FOLFIRINOX 10/26/2017  Cycle 6 FOLFIRINOX 11/09/2017  Restaging CTs at [facility] 11/29/2017-no definitive evidence of distant metastatic disease. 2 subcentimeter liver lesions described on prior MRI not visualized on CT. Ill-defined pancreatic head mass stable to decreased in size measuring 2.1 x 2 cm. Peripancreatic inflammatory stranding decreased from prior. No biliary ductal dilatation. Celiac axis less than 180 degrees abutment. Common hepatic artery with greater than 180 degrees encasement; superior mesenteric artery with short segment less than 180 degrees abutment; portal vein/superior mesenteric vein with greater than 180 degrees encasement of the extrahepatic portal vein with associated circumferential narrowing at the portomesenteric, overall substantially improved from prior examination. Now less than 180 degree abutment of the SMV. The portal vein and SMV remain patent. Splenic vein  patent.  Cycle 1 gemcitabine/Abraxane 12/06/2017  Cycle 2 gemcitabine/Abraxane 12/20/2017  Cycle 3 gemcitabine/Abraxane 01/03/2018  Cycle 4 gemcitabine/Abraxane 01/17/2018  Cycle 5 gemcitabine 01/31/2018 (Abraxane held due to neuropathy)  Cycle 6 gemcitabine 02/14/2018 (Abraxane held due to neuropathy)  Cycle 7  gemcitabine 02/28/2018 (Abraxane held due to neuropathy)  CT chest/abdomen/pelvis at [facility] 03/08/2018-stable appearance of previously identified infiltrating pancreatic head mass. No CT evidence of metastatic disease.  SBRT to the pancreas 04/05/2018-04/11/2018  05/09/2018 CA-19-9 2138   MRI abdomen 05/09/2018-similar 6 mm hypoenhancing focus posterior right lobe liver; more superior lesion near the dome of the liver not identified on the postcontrast imaging but there is a persistent focus of diffusion signal abnormality in this region; new 8 mm hypoenhancing focus located just superior to the gallbladder; additional tiny focus of diffusion abnormality located more superiorly without obvious associated abnormal enhancement. Ill-defined pancreatic head mass not felt to be significantly changed.  CT chest/abdomen/pelvis 05/10/2018-ill-defined hypoattenuating pancreatic head mass and ill-defined soft tissue abutting the celiac axis with mildly increased dilatation of the main pancreatic duct. New soft tissue nodule along the right upper lobe bronchus; hypoattenuating liver lesions concerning for metastasis.  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 The stent traverses the pancreatic mass and extends into the duodenum. Increase in size of 2  liver lesions. Pancreatic mass appears similar. Indistinct tissue planes around the duodenum along with wall thickening in the gallbladder and especially the gallbladder neck.  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 Diffuse hepatic metastasis. Previous index lesions unchanged in size. Single new metastasis identified within the medial segment left lobe of liver. 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 Abdominal ascites increased in the interval.  Cycle 7 FOLFIRINOX6/02/2019 2. Hypertension 3. Port-A-Cath placement  08/30/2017 4. History of elevated bilirubin-questionGilbert's syndrome 5. Mild leukopenia, thrombocytopenia-question secondary to portal hypertension/splenomegaly;09/14/2017 white count improved, platelet count stable;progressive thrombocytopenia following gemcitabine/Abraxane 6. Kidney stones 7. Fever following cycles 2 and 3 gemcitabine/Abraxane,? Fever related to gemcitabine 8.Jaundice, elevated liver enzymes and bilirubin 06/22/2018; status post ERCP 06/25/2018 with debris removed from the existing stent and a new plastic stent placed. Recurrent jaundice 07/19/2018 status post ERCP 07/20/2018 with finding that the previously placed plastic stent had migrated distally. There was debris and sludge on the stent. The previously placed stent was removed.  5 to 8 mm stricture just proximal to the existing SEMS. 2 plastic stents were placed.ERCP 08/16/2018-2 plastic biliary stents were removed from within the previously placed partially covered SEMS; stricture confirmed just proximal to the previously placed SEMS, treated with placement of an 8 cm long 10 mm diameter uncovered SEMS in good position. 9.Hospitalized with bacteremia Streptococcus group C 11/04/2018 through 11/08/2018. IV ceftriaxone through 11/17/2018. TEE 11/08/2018 negative. 10.Elevated LFTs 12/03/2018-MRI abdomen 12/04/2018 with mild increase in size of mass involving head of the pancreas. Diffuse hepatic metastasis. Previous index lesions unchanged in size. Single new metastasis identified within the medial segment left lobe of liver. 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 Abdominal ascites increased in the interval. 12/06/2018 ERCP-previously placed uncovered 8 cm long metal biliary stent nearly filled with bio debris and flecks of tissue ingrowth. Stent cleared. 11. 12/24/18 -admission with sepsis syndrome, blood culture from right arm positive for Streptococcus Anginosis, blood culture from  Port-A-Cath with a Bacteroides species 12.  12/28/2018-readmission with sepsis syndrome 13. 01/09/2019 -readmission with hyperbilirubinemia- ERCP 01/10/2019 confirmed tumor ingrowth and debris in the bile duct stent, status post debridement of the stent and placement of a stent and stent  ASSESSMENT/PLAN: Mr. Eckerman appears improved.  He underwent ERCP on 01/10/2019 which showed extensive tumor ingrowth for disease progression.  He also had a paracentesis performed on 01/10/2019.  Total bilirubin and LFTs are trending downward.  Blood culture again positive for enterococcus an MRSA.  He continues to have pancytopenia secondary to recent chemotherapy is overall stable.  1.  The patient has disease progression noted by the tumor ingrowth into his stent.  This is been discussed with him.  We will plan to see him back in the office next week for reevaluation and to discuss proceeding with additional chemotherapy versus moving towards comfort care. 2.  We will follow-up on cytology from paracentesis. 3.  Continue IV antibiotics and IV antifungal. 4.  Continue to monitor CBC and transfuse as needed.  No transfusion is indicated today.   We will plan to see the patient back for follow-up as an outpatient next week.  Please contact medical oncology over the weekend if any questions arise..   LOS: 2 days   Mikey Bussing, DNP, AGPCNP-BC, AOCNP 01/11/19   Mr. Chea reports feeling better after the paracentesis yesterday.  He understands the tumor ingrowth is most likely related to disease progression.  The recurrent bacteremia is likely related to tumor involving the gastrointestinal tract.  He understands further chemotherapy may not be an option.  I recommend hospice care if the peritoneal fluid cytology returns positive.  The blood culture from admission is positive for gram-positive cocci.  I recommend the infectious disease service prescribe an outpatient antibiotic regimen for Mr.  Achor.  Gastroenterology can recommend a diuretic regimen if the ascites is felt to be related to portal hypertension.  Please call Oncology as needed over the weekend.  Mr. Legrand is scheduled for outpatient follow-up at the Cancer center on 01/15/2019.  Julieanne Manson, MD

## 2019-01-11 NOTE — Progress Notes (Signed)
[facility] Progress Note    Since last GI note: ERCP yesterday, see full report in Epic.  He also had 3.4L paracentesis (gram stain neg for bacteria).  No clinical pancreatitis following the procedure.  This morning he is weak, tired  Objective: Vital signs in last 24 hours: Temp:  [97.5 F (36.4 C)-99 F (37.2 C)] 97.5 F (36.4 C) (07/10 0458) Pulse Rate:  [68-91] 68 (07/10 0458) Resp:  [14-20] 20 (07/10 0458) BP: (115-137)/(66-86) 115/79 (07/10 0458) SpO2:  [95 %-100 %] 95 % (07/10 0458) Weight:  [88.6 kg] 88.6 kg (07/09 1140) Last BM Date: 01/07/19 General: alert and oriented times 3 Heart: regular rate and rythm Abdomen: soft, non-tender, non-distended, normal bowel sounds   Lab Results: Recent Labs    01/09/19 1223 01/10/19 0352 01/11/19 0430  WBC 4.1 3.7* 2.5*  HGB 10.7* 8.7* 8.1*  PLT 63* 59* 61*  MCV 87.9 88.3 88.5   Recent Labs    01/09/19 1223 01/10/19 0352 01/11/19 0430  NA 130* 130* 136  K 4.0 4.2 4.1  CL 95* 95* 101  CO2 27 26 28   GLUCOSE 110* 101* 115*  BUN 7* 8 10  CREATININE 0.67 0.40* 0.51*  CALCIUM 8.0* 7.8* 7.8*   Recent Labs    01/09/19 1223 01/10/19 0352 01/11/19 0430  PROT 5.0* 4.9* 4.4*  ALBUMIN 2.1* 2.1* 1.9*  AST 268* 171* 98*  ALT 175* 133* 100*  ALKPHOS 1,031* 778* 749*  BILITOT 8.0* 9.0* 7.1*   Recent Labs    01/10/19 0352  INR 1.1      Medications: Scheduled Meds: . amLODipine  10 mg Oral QHS  . doxazosin  1 mg Oral QHS  . gabapentin  200 mg Oral BID  . heparin  5,000 Units Subcutaneous Q8H  . indomethacin  100 mg Rectal Once  . irbesartan  300 mg Oral Daily  . magnesium oxide  400 mg Oral BID  . potassium chloride SA  20 mEq Oral BID  . sodium chloride flush  10-40 mL Intracatheter Q12H   Continuous Infusions: . sodium chloride 20 mL/hr at 01/11/19 0600  . anidulafungin Stopped (01/10/19 2333)  . lactated ringers 1,000 mL (01/10/19 1147)  . 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   Tapping his ascites was a good idea, no obvious infection.  Cytology results will be interesting.  Would continue another 24 hours of IV abx and then change to 5 day course of oral.  I mentioned to him that he could consider palliative options any time.  He's already been thinking about that.  Please call or page with any further questions or concerns.   Milus Banister, MD  01/11/2019, 8:30 AM [facility] Pager [phone]

## 2019-01-11 NOTE — Progress Notes (Signed)
PT Cancellation Note  Patient Details Name: [name] MRN: [id number] DOB: [birth year]   Cancelled Treatment:    Reason Eval/Treat Not Completed: Patient declined, no reason specified(not feeling well, cold)   [facility] 01/11/2019, 1:34 PM

## 2019-01-11 NOTE — Progress Notes (Signed)
Wife called nurse to report that "patient is having rigors scared and confused" nurse went to bedside and patient states that " he is shivering which usually means he has a fever oral temp 98.5". Patient also stated that this is normally accompanied by nausea. PRN nausea medication given. Paitent c/o feeling cold. Patient is currently alert and oriented times 4. Will continue to monitor patient.

## 2019-01-12 DIAGNOSIS — B952 Enterococcus as the cause of diseases classified elsewhere: Secondary | ICD-10-CM

## 2019-01-12 DIAGNOSIS — C259 Malignant neoplasm of pancreas, unspecified: Secondary | ICD-10-CM | POA: Diagnosis not present

## 2019-01-12 DIAGNOSIS — C250 Malignant neoplasm of head of pancreas: Principal | ICD-10-CM

## 2019-01-12 DIAGNOSIS — R7881 Bacteremia: Secondary | ICD-10-CM | POA: Diagnosis not present

## 2019-01-12 DIAGNOSIS — A401 Sepsis due to streptococcus, group B: Secondary | ICD-10-CM | POA: Diagnosis not present

## 2019-01-12 LAB — CULTURE, BLOOD (ROUTINE X 2)
Culture: NO GROWTH
Culture: NO GROWTH

## 2019-01-12 LAB — URINE CULTURE: Culture: NO GROWTH

## 2019-01-12 LAB — CULTURE, BLOOD (SINGLE): Special Requests: ADEQUATE

## 2019-01-12 LAB — COMPREHENSIVE METABOLIC PANEL
ALT: 71 U/L — ABNORMAL HIGH (ref 0–44)
AST: 53 U/L — ABNORMAL HIGH (ref 15–41)
Albumin: 1.8 g/dL — ABNORMAL LOW (ref 3.5–5.0)
Alkaline Phosphatase: 711 U/L — ABNORMAL HIGH (ref 38–126)
Anion gap: 8 (ref 5–15)
BUN: 11 mg/dL (ref 8–23)
CO2: 28 mmol/L (ref 22–32)
Calcium: 7.6 mg/dL — ABNORMAL LOW (ref 8.9–10.3)
Chloride: 96 mmol/L — ABNORMAL LOW (ref 98–111)
Creatinine, Ser: 0.49 mg/dL — ABNORMAL LOW (ref 0.61–1.24)
GFR calc Af Amer: >60 mL/min (ref >60)
GFR calc non Af Amer: >60 mL/min (ref >60)
Glucose, Bld: 117 mg/dL — ABNORMAL HIGH (ref 70–99)
Potassium: 3.9 mmol/L (ref 3.5–5.1)
Sodium: 132 mmol/L — ABNORMAL LOW (ref 135–145)
Total Bilirubin: 5 mg/dL — ABNORMAL HIGH (ref 0.3–1.2)
Total Protein: 4.4 g/dL — ABNORMAL LOW (ref 6.5–8.1)

## 2019-01-12 LAB — CBC WITH DIFFERENTIAL/PLATELET
Abs Immature Granulocytes: 0.02 10*3/uL (ref 0.00–0.07)
Basophils Absolute: 0 10*3/uL (ref 0.0–0.1)
Basophils Relative: 0 %
Eosinophils Absolute: 0 10*3/uL (ref 0.0–0.5)
Eosinophils Relative: 0 %
HCT: 25.4 % — ABNORMAL LOW (ref 39.0–52.0)
Hemoglobin: 8.1 g/dL — ABNORMAL LOW (ref 13.0–17.0)
Immature Granulocytes: 1 %
Lymphocytes Relative: 9 %
Lymphs Abs: 0.2 10*3/uL — ABNORMAL LOW (ref 0.7–4.0)
MCH: 28.3 pg (ref 26.0–34.0)
MCHC: 31.9 g/dL (ref 30.0–36.0)
MCV: 88.8 fL (ref 80.0–100.0)
Monocytes Absolute: 0.3 10*3/uL (ref 0.1–1.0)
Monocytes Relative: 11 %
Neutro Abs: 2.1 10*3/uL (ref 1.7–7.7)
Neutrophils Relative %: 79 %
Platelets: 69 10*3/uL — ABNORMAL LOW (ref 150–400)
RBC: 2.86 MIL/uL — ABNORMAL LOW (ref 4.22–5.81)
RDW: 15.9 % — ABNORMAL HIGH (ref 11.5–15.5)
WBC: 2.6 10*3/uL — ABNORMAL LOW (ref 4.0–10.5)
nRBC: 0 % (ref 0.0–0.2)

## 2019-01-12 LAB — MAGNESIUM: Magnesium: 2 mg/dL (ref 1.7–2.4)

## 2019-01-12 MED ORDER — DIPHENHYDRAMINE HCL 25 MG PO CAPS
25.0000 mg | ORAL_CAPSULE | Freq: Four times a day (QID) | ORAL | Status: DC | PRN
  Administered 2019-01-12 – 2019-01-15 (×4): 25 mg via ORAL
  Filled 2019-01-12 (×4): qty 1

## 2019-01-12 NOTE — Progress Notes (Signed)
[name] Kitchen  PROGRESS NOTE    Cory Kelly  IHK:[id number] DOB: [birth year] DOA: 01/09/2019 PCP: Mancel Bale, PA-C   Brief Narrative:   Cory Kelly a 63 y.o.malewith medical history significant ofHTN, pancreatic cancer, bacteremia. He presents today w/ complaints of generalized weakness and falls. He reports multiple falls over the last several months. They generally do not involve chest pain, palpitations, dizziness, LOC. He reports that his most recent fall was 2 days ago, but this was due to tripping over his dog. He says that he generally feels unsteady on his feet.   He was following up with his oncology team today (history of pancreatic cancer) and was found to have elevated LFTs/bilirubin. They became concerned and spoke with GI. He was sent to the ED and evaluated by the GI team. They believe he may have a problem with his biliary stent and take him for ERCP in the morning.  Of note, he has had multiple recent admissions. He was recently diagnosed w/ strep bacteremia and candidemia. He was placed on long term IV abx.   Assessment & Plan:   Active Problems:   Essential hypertension   Hyperlipemia   Elevated LFTs   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[place])   Streptococcal bacteremia   Candidemia ([place])   Falls   Bile duct obstruction   Falls - reports multiple falls over the last several months and a general feeling of being unsteady on his feet.  - most recent fall 2 nights ago; no LOC, head injury; Mechanism of fall was tripping over his moving dog - films negative for Fx - PT/OT recs pending  Elevated LFT/Hyperbilirubinemia - history of pancreatic CA w/ biliary stent - s/p ERCP: see GI note; stent occluded, stented the stent  Pancreatic CA - Follows with Dr. Benay Spice - s/p 7 cycles FOLFIRI - they note a 21lb weight increase since 12/24/2018 and were scheduling him for paracentesis; consider completion after ERCP - onco onboard      - poor prognosis; family would like to speak with palliative care, they have been consulted     - now s/p paracentesis as well  Streptococcal bacteremia/Candidemia - currently on ertapenem through 01/26/2019 and capsofungin through 02/09/2019; continue - Heme/onc requesting review by ID, will speak with ID     - appreciate ID assistance; abx transitioned  Enterococcus/Coag-neg staph bacteremia - labs drawn from [facility] - pt started on vanc ON w/ pharm consult - will speak with ID     - Appreciate ID assistance; will need PICC holiday  HTN - continue amlodipine, irbesartan  Continue line holiday. Cytology studies pending. Continue current abx. Rpt Bld Cx tomorrow? Will await final ID recs.   DVT prophylaxis:heparin Code Status:FULL Disposition Plan: TBD   Consultants:   GI  ID  Oncology  Procedures:   ERCP  Paracentesis  Antimicrobials:  . Eraxis, merrem    Subjective: "Well... that's not positive."  Objective: Vitals:   01/11/19 1300 01/11/19 2121 01/12/19 0523 01/12/19 1429  BP: (!) 147/81 118/76 104/76 112/83  Pulse: (!) 105 86 68 68  Resp: 20 16 16 20   Temp: 99 F (37.2 C) 98.4 F (36.9 C) 97.9 F (36.6 C) 98.4 F (36.9 C)  TempSrc: Oral   Oral  SpO2: 94% 93% 93% 95%  Weight:      Height:        Intake/Output Summary (Last 24 hours) at 01/12/2019 1445 Last data filed at 01/12/2019 1100 Gross per 24 hour  Intake 1393  ml  Output 851 ml  Net 542 ml   Filed Weights   01/10/19 1140  Weight: 88.6 kg    Examination:  General:63 y.o.maleresting in bed in NAD Cardiovascular: RRR, +S1, S2, no m/g/r, equal pulses throughout Respiratory: CTABL, no w/r/r, normal WOB GI: BS+, ND, TTP RUQ,no masses noted, no organomegaly noted MSK: No e/c/c Skin: No rashes, ulcerations noted; multiple bruises noted on b/l UE, he is jaundiced Neuro:alert to name follows commands Psyc: Appropriate interaction and affect,  calm/cooperative     Data Reviewed: I have personally reviewed following labs and imaging studies.  CBC: Recent Labs  Lab 01/06/19 2041 01/09/19 1223 01/10/19 0352 01/11/19 0430 01/12/19 0753  WBC 4.7 4.1 3.7* 2.5* 2.6*  NEUTROABS 4.0 3.6  --  2.0 2.1  HGB 10.1* 10.7* 8.7* 8.1* 8.1*  HCT 31.3* 33.5* 27.9* 26.2* 25.4*  MCV 87.7 87.9 88.3 88.5 88.8  PLT 71* 63* 59* 61* 69*   Basic Metabolic Panel: Recent Labs  Lab 01/06/19 2041 01/09/19 1223 01/10/19 0352 01/11/19 0430 01/12/19 0753  NA 132* 130* 130* 136 132*  K 3.8 4.0 4.2 4.1 3.9  CL 95* 95* 95* 101 96*  CO2 29 27 26 28 28   GLUCOSE 165* 110* 101* 115* 117*  BUN 7* 7* 8 10 11   CREATININE 0.77 0.67 0.40* 0.51* 0.49*  CALCIUM 8.0* 8.0* 7.8* 7.8* 7.6*  MG  --  1.6* 1.8 2.0 2.0  PHOS  --   --   --  3.4  --    GFR: Estimated Creatinine Clearance: 121.5 mL/min (A) (by C-G formula based on SCr of 0.49 mg/dL (L)). Liver Function Tests: Recent Labs  Lab 01/06/19 2041 01/09/19 1223 01/10/19 0352 01/11/19 0430 01/12/19 0753  AST 226* 268* 171* 98* 53*  ALT 94* 175* 133* 100* 71*  ALKPHOS 834* 1,031* 778* 749* 711*  BILITOT 3.3* 8.0* 9.0* 7.1* 5.0*  PROT 5.1* 5.0* 4.9* 4.4* 4.4*  ALBUMIN 2.5* 2.1* 2.1* 1.9* 1.8*   No results for input(s): LIPASE, AMYLASE in the last 168 hours. No results for input(s): AMMONIA in the last 168 hours. Coagulation Profile: Recent Labs  Lab 01/10/19 0352  INR 1.1   Cardiac Enzymes: No results for input(s): CKTOTAL, CKMB, CKMBINDEX, TROPONINI in the last 168 hours. BNP (last 3 results) No results for input(s): PROBNP in the last 8760 hours. HbA1C: No results for input(s): HGBA1C in the last 72 hours. CBG: No results for input(s): GLUCAP in the last 168 hours. Lipid Profile: No results for input(s): CHOL, HDL, LDLCALC, TRIG, CHOLHDL, LDLDIRECT in the last 72 hours. Thyroid Function Tests: No results for input(s): TSH, T4TOTAL, FREET4, T3FREE, THYROIDAB in the last 72 hours.  Anemia Panel: No results for input(s): VITAMINB12, FOLATE, FERRITIN, TIBC, IRON, RETICCTPCT in the last 72 hours. Sepsis Labs: Recent Labs  Lab 01/06/19 2041  LATICACIDVEN 0.7    Recent Results (from the past 240 hour(s))  Blood Culture (routine x 2)     Status: None (Preliminary result)   Collection Time: 01/06/19  8:41 PM   Specimen: BLOOD  Result Value Ref Range Status   Specimen Description   Final    BLOOD RIGHT WRIST Performed at [facility] [address] [place], [place] [address]    Special Requests   Final    BOTTLES DRAWN AEROBIC AND ANAEROBIC Blood Culture results may not be optimal due to an excessive volume of blood received in culture bottles Performed at [facility] [address]  [place], [place] [address]    Culture   Final    NO GROWTH 4 DAYS Performed at [facility], [place] [address], [place], [place] [address]    Report Status PENDING  Incomplete  Blood Culture (routine x 2)     Status: None (Preliminary result)   Collection Time: 01/06/19  8:46 PM   Specimen: BLOOD  Result Value Ref Range Status   Specimen Description   Final    BLOOD LEFT WRIST Performed at [facility] [address], [place], [place] [address]    Special Requests   Final    BOTTLES DRAWN AEROBIC AND ANAEROBIC Blood Culture results may not be optimal due to an excessive volume of blood received in culture bottles Performed at [facility] [address], [place], [place] [address]    Culture   Final    NO GROWTH 4 DAYS Performed at [facility], [place] [address] [place], [place] [address]    Report Status PENDING  Incomplete  Culture, Blood     Status: None (Preliminary result)   Collection Time: 01/09/19 12:30 PM   Specimen: BLOOD LEFT HAND  Result Value Ref Range Status   Specimen Description   Final    BLOOD LEFT HAND Performed at [facility], [place]  [address] [place], [place] [address]    Special Requests   Final    BOTTLES DRAWN AEROBIC AND ANAEROBIC Blood Culture results may not be optimal due to an inadequate volume of blood received in culture bottles   Culture   Final    NO GROWTH 2 DAYS Performed at [facility], [place] [address] [place], [place] [address]    Report Status PENDING  Incomplete  Culture, Blood     Status: Abnormal   Collection Time: 01/09/19  1:13 PM   Specimen: BLOOD  Result Value Ref Range Status   Specimen Description BLOOD PICC LINE  Final   Special Requests   Final    BOTTLES DRAWN AEROBIC AND ANAEROBIC Blood Culture adequate volume   Culture  Setup Time   Final    GRAM POSITIVE COCCI IN BOTH AEROBIC AND ANAEROBIC BOTTLES CRITICAL RESULT CALLED TO, READ BACK BY AND VERIFIED WITH: PHRMD B GREEN @0529  01/10/19 BY S GEZAHEGN Performed at [facility], [place] [address], [place], [place] [address]    Culture (A)  Final    ENTEROCOCCUS AVIUM STAPHYLOCOCCUS SPECIES (COAGULASE NEGATIVE)    Report Status 01/12/2019 FINAL  Final   Organism ID, Bacteria ENTEROCOCCUS AVIUM  Final      Susceptibility   Enterococcus avium - MIC*    AMPICILLIN <=2 SENSITIVE Sensitive     VANCOMYCIN <=0.5 SENSITIVE Sensitive     GENTAMICIN SYNERGY SENSITIVE Sensitive     * ENTEROCOCCUS AVIUM  Blood Culture ID Panel (Reflexed)     Status: Abnormal   Collection Time: 01/09/19  1:13 PM  Result Value Ref Range Status   Enterococcus species DETECTED (A) NOT DETECTED Final    Comment: CRITICAL RESULT CALLED TO, READ BACK BY AND VERIFIED WITH: PHRMD B GREEN @0529  01/10/19 BY S GEZAHEGN    Vancomycin resistance NOT DETECTED NOT DETECTED Final   Listeria monocytogenes NOT DETECTED NOT DETECTED Final   Staphylococcus species DETECTED (A) NOT DETECTED Final    Comment: Methicillin (oxacillin) resistant coagulase negative staphylococcus. Possible blood culture contaminant (unless isolated from more than one blood culture draw or  clinical case suggests  pathogenicity). No antibiotic treatment is indicated for blood  culture contaminants. CRITICAL RESULT CALLED TO, READ BACK BY AND VERIFIED WITH: PHRMD B GREEN @0529  01/10/19 BY S GEZAHEGN    Staphylococcus aureus (BCID) NOT DETECTED NOT DETECTED Final   Methicillin resistance DETECTED (A) NOT DETECTED Final    Comment: CRITICAL RESULT CALLED TO, READ BACK BY AND VERIFIED WITH: PHRMD B GREEN @0529  01/10/19 BY S GEZAHEGN    Streptococcus species NOT DETECTED NOT DETECTED Final   Streptococcus agalactiae NOT DETECTED NOT DETECTED Final   Streptococcus pneumoniae NOT DETECTED NOT DETECTED Final   Streptococcus pyogenes NOT DETECTED NOT DETECTED Final   Acinetobacter baumannii NOT DETECTED NOT DETECTED Final   Enterobacteriaceae species NOT DETECTED NOT DETECTED Final   Enterobacter cloacae complex NOT DETECTED NOT DETECTED Final   Escherichia coli NOT DETECTED NOT DETECTED Final   Klebsiella oxytoca NOT DETECTED NOT DETECTED Final   Klebsiella pneumoniae NOT DETECTED NOT DETECTED Final   Proteus species NOT DETECTED NOT DETECTED Final   Serratia marcescens NOT DETECTED NOT DETECTED Final   Haemophilus influenzae NOT DETECTED NOT DETECTED Final   Neisseria meningitidis NOT DETECTED NOT DETECTED Final   Pseudomonas aeruginosa NOT DETECTED NOT DETECTED Final   Candida albicans NOT DETECTED NOT DETECTED Final   Candida glabrata NOT DETECTED NOT DETECTED Final   Candida krusei NOT DETECTED NOT DETECTED Final   Candida parapsilosis NOT DETECTED NOT DETECTED Final   Candida tropicalis NOT DETECTED NOT DETECTED Final    Comment: Performed at [facility], [place]. [address], [place], [place] [address]  SARS Coronavirus 2 (CEPHEID - Performed in [facility] hospital lab), Hosp Order     Status: None   Collection Time: 01/09/19  6:07 PM   Specimen: Nasopharyngeal Swab  Result Value Ref Range Status   SARS Coronavirus 2 NEGATIVE NEGATIVE Final    Comment: (NOTE) If result  is NEGATIVE SARS-CoV-2 target nucleic acids are NOT DETECTED. The SARS-CoV-2 RNA is generally detectable in upper and lower  respiratory specimens during the acute phase of infection. The lowest  concentration of SARS-CoV-2 viral copies this assay can detect is 250  copies / mL. A negative result does not preclude SARS-CoV-2 infection  and should not be used as the sole basis for treatment or other  patient management decisions.  A negative result may occur with  improper specimen collection / handling, submission of specimen other  than nasopharyngeal swab, presence of viral mutation(s) within the  areas targeted by this assay, and inadequate number of viral copies  (<250 copies / mL). A negative result must be combined with clinical  observations, patient history, and epidemiological information. If result is POSITIVE SARS-CoV-2 target nucleic acids are DETECTED. The SARS-CoV-2 RNA is generally detectable in upper and lower  respiratory specimens dur ing the acute phase of infection.  Positive  results are indicative of active infection with SARS-CoV-2.  Clinical  correlation with patient history and other diagnostic information is  necessary to determine patient infection status.  Positive results do  not rule out bacterial infection or co-infection with other viruses. If result is PRESUMPTIVE POSTIVE SARS-CoV-2 nucleic acids MAY BE PRESENT.   A presumptive positive result was obtained on the submitted specimen  and confirmed on repeat testing.  While 2019 novel coronavirus  (SARS-CoV-2) nucleic acids may be present in the submitted sample  additional confirmatory testing may be necessary for epidemiological  and / or clinical management purposes  to differentiate between  SARS-CoV-2 and other  Sarbecovirus currently known to infect humans.  If clinically indicated additional testing with an alternate test  methodology [phone]) is advised. The SARS-CoV-2 RNA is generally   detectable in upper and lower respiratory sp ecimens during the acute  phase of infection. The expected result is Negative. Fact Sheet for Patients:  StrictlyIdeas.no Fact Sheet for Healthcare Providers: BankingDealers.co.za This test is not yet approved or cleared by the [place] FDA and has been authorized for detection and/or diagnosis of SARS-CoV-2 by FDA under an Emergency Use Authorization (EUA).  This EUA will remain in effect (meaning this test can be used) for the duration of the COVID-19 declaration under Section 564(b)(1) of the Act, 21 U.S.C. section 360bbb-3(b)(1), unless the authorization is terminated or revoked sooner. Performed at [facility], [place] [address], [place], [place] [address]   Body fluid culture     Status: None (Preliminary result)   Collection Time: 01/10/19  4:16 PM   Specimen: Peritoneal Fluid  Result Value Ref Range Status   Specimen Description   Final    PERITONEAL Performed at [facility] [address], [place], [place] [address]    Special Requests   Final    NONE Performed at [facility], [place] [address] [place], [place] [address]    Gram Stain   Final    FEW WBC PRESENT,BOTH PMN AND MONONUCLEAR NO ORGANISMS SEEN    Culture   Final    NO GROWTH 2 DAYS Performed at [facility], [place] [address], [place], [place] [address]    Report Status PENDING  Incomplete  Urine culture     Status: None   Collection Time: 01/11/19  4:50 AM   Specimen: Urine, Clean Catch  Result Value Ref Range Status   Specimen Description   Final    URINE, CLEAN CATCH Performed at [facility], [place] [address], [place], [place] [address]    Special Requests   Final    NONE Performed at 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 Request is made for diagnostic and therapeutic paracentesis. EXAM: ULTRASOUND GUIDED DIAGNOSTIC AND THERAPEUTIC PARACENTESIS MEDICATIONS: 10 mL 1% lidocaine COMPLICATIONS: None immediate. PROCEDURE: Informed written consent was obtained from the patient after a discussion of the risks, benefits and alternatives to treatment. A timeout was performed prior to the initiation of the procedure. Initial ultrasound scanning demonstrates a moderate amount of ascites within the right lower abdominal quadrant. The right lower abdomen was prepped and draped in the usual sterile fashion. 1% lidocaine was used for local anesthesia. Following this, a 19 gauge, 7-cm, Yueh catheter was introduced. An ultrasound image was saved for documentation purposes. The paracentesis was performed. The catheter was removed and a dressing was applied. The patient tolerated the procedure well without immediate post procedural complication. FINDINGS: A total of approximately 3.35 L of clear gold fluid was removed. Samples were sent to the laboratory as requested by the clinical team. IMPRESSION: Successful ultrasound-guided paracentesis yielding 3.35 L of peritoneal fluid. Read by: Earley Abide, PA-C Electronically Signed   By: Corrie Mckusick  D.O.   On: 01/10/2019 16:46        Scheduled Meds: . amLODipine  10 mg Oral QHS  . doxazosin  1 mg Oral QHS  . gabapentin  200 mg Oral BID  . heparin  5,000 Units Subcutaneous Q8H  . indomethacin  100 mg Rectal Once  . irbesartan  300 mg Oral Daily  . magnesium oxide  400 mg Oral BID  . potassium chloride SA  20 mEq Oral BID  . sodium chloride flush  10-40 mL Intracatheter Q12H   Continuous Infusions: . sodium chloride 250 mL  (01/12/19 1309)  . anidulafungin 78 mL/hr at 01/11/19 2304  . lactated ringers 1,000 mL (01/10/19 1147)  . meropenem (MERREM) IV 200 mL/hr at 01/12/19 0604     LOS: 3 days    Time spent: 25 minutes spent in the coordination of care today.    Jonnie Finner, DO [facility] Pager [phone]  If 7PM-7AM, please contact night-coverage [email or web address] Password [facility] 01/12/2019, 2:45 PM

## 2019-01-13 DIAGNOSIS — B49 Unspecified mycosis: Secondary | ICD-10-CM

## 2019-01-13 DIAGNOSIS — Z8619 Personal history of other infectious and parasitic diseases: Secondary | ICD-10-CM

## 2019-01-13 DIAGNOSIS — C787 Secondary malignant neoplasm of liver and intrahepatic bile duct: Secondary | ICD-10-CM

## 2019-01-13 DIAGNOSIS — G4700 Insomnia, unspecified: Secondary | ICD-10-CM

## 2019-01-13 DIAGNOSIS — A408 Other streptococcal sepsis: Secondary | ICD-10-CM

## 2019-01-13 DIAGNOSIS — C259 Malignant neoplasm of pancreas, unspecified: Secondary | ICD-10-CM

## 2019-01-13 DIAGNOSIS — B379 Candidiasis, unspecified: Secondary | ICD-10-CM

## 2019-01-13 DIAGNOSIS — L299 Pruritus, unspecified: Secondary | ICD-10-CM

## 2019-01-13 DIAGNOSIS — F419 Anxiety disorder, unspecified: Secondary | ICD-10-CM

## 2019-01-13 LAB — RENAL FUNCTION PANEL
Albumin: 1.9 g/dL — ABNORMAL LOW (ref 3.5–5.0)
Anion gap: 7 (ref 5–15)
BUN: 10 mg/dL (ref 8–23)
CO2: 28 mmol/L (ref 22–32)
Calcium: 7.7 mg/dL — ABNORMAL LOW (ref 8.9–10.3)
Chloride: 96 mmol/L — ABNORMAL LOW (ref 98–111)
Creatinine, Ser: 0.47 mg/dL — ABNORMAL LOW (ref 0.61–1.24)
GFR calc Af Amer: >60 mL/min (ref >60)
GFR calc non Af Amer: >60 mL/min (ref >60)
Glucose, Bld: 103 mg/dL — ABNORMAL HIGH (ref 70–99)
Phosphorus: 2.8 mg/dL (ref 2.5–4.6)
Potassium: 3.9 mmol/L (ref 3.5–5.1)
Sodium: 131 mmol/L — ABNORMAL LOW (ref 135–145)

## 2019-01-13 LAB — CBC WITH DIFFERENTIAL/PLATELET
Abs Immature Granulocytes: 0.01 10*3/uL (ref 0.00–0.07)
Basophils Absolute: 0 10*3/uL (ref 0.0–0.1)
Basophils Relative: 1 %
Eosinophils Absolute: 0 10*3/uL (ref 0.0–0.5)
Eosinophils Relative: 1 %
HCT: 27.2 % — ABNORMAL LOW (ref 39.0–52.0)
Hemoglobin: 8.4 g/dL — ABNORMAL LOW (ref 13.0–17.0)
Immature Granulocytes: 1 %
Lymphocytes Relative: 12 %
Lymphs Abs: 0.2 10*3/uL — ABNORMAL LOW (ref 0.7–4.0)
MCH: 27.5 pg (ref 26.0–34.0)
MCHC: 30.9 g/dL (ref 30.0–36.0)
MCV: 89.2 fL (ref 80.0–100.0)
Monocytes Absolute: 0.2 10*3/uL (ref 0.1–1.0)
Monocytes Relative: 12 %
Neutro Abs: 1.5 10*3/uL — ABNORMAL LOW (ref 1.7–7.7)
Neutrophils Relative %: 73 %
Platelets: 90 10*3/uL — ABNORMAL LOW (ref 150–400)
RBC: 3.05 MIL/uL — ABNORMAL LOW (ref 4.22–5.81)
RDW: 15.9 % — ABNORMAL HIGH (ref 11.5–15.5)
WBC: 2 10*3/uL — ABNORMAL LOW (ref 4.0–10.5)
nRBC: 0 % (ref 0.0–0.2)

## 2019-01-13 LAB — MAGNESIUM: Magnesium: 1.9 mg/dL (ref 1.7–2.4)

## 2019-01-13 NOTE — Progress Notes (Signed)
[name] Kitchen  PROGRESS NOTE    Cory Kelly  YQM:[id number] DOB: 1956/01/15 DOA: 01/09/2019 PCP: Mancel Bale, PA-C   Brief Narrative:   Cory Kelly a 63 y.o.malewith medical history significant ofHTN, pancreatic cancer, bacteremia. He presents today w/ complaints of generalized weakness and falls. He reports multiple falls over the last several months. They generally do not involve chest pain, palpitations, dizziness, LOC. He reports that his most recent fall was 2 days ago, but this was due to tripping over his dog. He says that he generally feels unsteady on his feet.   He was following up with his oncology team today (history of pancreatic cancer) and was found to have elevated LFTs/bilirubin. They became concerned and spoke with GI. He was sent to the ED and evaluated by the GI team. They believe he may have a problem with his biliary stent and take him for ERCP in the morning.  Of note, he has had multiple recent admissions. He was recently diagnosed w/ strep bacteremia and candidemia. He was placed on long term IV abx.   Assessment & Plan:   Active Problems:   Essential hypertension   Hyperlipemia   Elevated LFTs   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[place])   Streptococcal bacteremia   Candidemia ([place])   Falls   Bile duct obstruction   Falls - reports multiple falls over the last several months and a general feeling of being unsteady on his feet.  - most recent fall 2 nights ago; no LOC, head injury; Mechanism of fall was tripping over his moving dog - films negative for Fx - PT rec HHPT, he has declined  Elevated LFT/Hyperbilirubinemia - history of pancreatic CA w/ biliary stent -s/p ERCP: see GI note; stent occluded, stented the stent  Pancreatic CA - Follows with Dr. Benay Spice - s/p 7 cycles FOLFIRI - they note a 21lb weight increase since 12/24/2018 and were scheduling him for paracentesis; consider completion after ERCP  -onco onboard - poor prognosis; family would like to speak with palliative care, they have been consulted - now s/p paracentesis as well  Streptococcal bacteremia/Candidemia - currently on ertapenem through 01/26/2019 and capsofungin through 02/09/2019; continue - Heme/onc requesting review by ID, will speak with ID - appreciate ID assistance; abx transitioned  Enterococcus/Coag-neg staph bacteremia - labs drawn from [facility] - pt started on vanc ON w/ pharm consult - will speak with ID -Appreciate ID assistance; will need PICC holiday     - if Bld Cx remain negative, can reintroduce PICC tomorrow, then resume abx course determined by ID  HTN - continue amlodipine, irbesartan   DVT prophylaxis:heparin Code Status:FULL Disposition Plan:TBD   Consultants:   GI  ID  Oncology  Procedures:   ERCP  Paracentesis  Antimicrobials:   Eraxis, merrem   Subjective: "It's another day. When can I go?"  Objective: Vitals:   01/12/19 0523 01/12/19 1429 01/12/19 2001 01/13/19 0526  BP: 104/76 112/83 123/85 124/81  Pulse: 68 68 66 72  Resp: 16 20 19 19   Temp: 97.9 F (36.6 C) 98.4 F (36.9 C) 98.3 F (36.8 C) 97.6 F (36.4 C)  TempSrc:  Oral Oral Oral  SpO2: 93% 95% 95% 94%  Weight:      Height:        Intake/Output Summary (Last 24 hours) at 01/13/2019 1323 Last data filed at 01/13/2019 0400 Gross per 24 hour  Intake 941.72 ml  Output -  Net 941.72 ml   [employer]   01/10/19  1140  Weight: 88.6 kg    Examination:  General:62 y.o.maleresting in bed in NAD Cardiovascular: RRR, +S1, S2, no m/g/r, equal pulses throughout Respiratory: CTABL, no w/r/r, normal WOB GI: BS+,ND, TTP RUQ,no masses noted, no organomegaly noted MSK: No e/c/c Skin: No rashes, ulcerations noted; multiple bruises noted on b/l UE, he is jaundiced Neuro:alert to name follows commands Psyc: Appropriate interaction and affect,  calm/cooperative    Data Reviewed: I have personally reviewed following labs and imaging studies.  CBC: Recent Labs  Lab 01/06/19 2041 01/09/19 1223 01/10/19 0352 01/11/19 0430 01/12/19 0753 01/13/19 0635  WBC 4.7 4.1 3.7* 2.5* 2.6* 2.0*  NEUTROABS 4.0 3.6  --  2.0 2.1 1.5*  HGB 10.1* 10.7* 8.7* 8.1* 8.1* 8.4*  HCT 31.3* 33.5* 27.9* 26.2* 25.4* 27.2*  MCV 87.7 87.9 88.3 88.5 88.8 89.2  PLT 71* 63* 59* 61* 69* 90*   Basic Metabolic Panel: Recent Labs  Lab 01/09/19 1223 01/10/19 0352 01/11/19 0430 01/12/19 0753 01/13/19 0635  NA 130* 130* 136 132* 131*  K 4.0 4.2 4.1 3.9 3.9  CL 95* 95* 101 96* 96*  CO2 27 26 28 28 28   GLUCOSE 110* 101* 115* 117* 103*  BUN 7* 8 10 11 10   CREATININE 0.67 0.40* 0.51* 0.49* 0.47*  CALCIUM 8.0* 7.8* 7.8* 7.6* 7.7*  MG 1.6* 1.8 2.0 2.0 1.9  PHOS  --   --  3.4  --  2.8   GFR: Estimated Creatinine Clearance: 121.5 mL/min (A) (by C-G formula based on SCr of 0.47 mg/dL (L)). Liver Function Tests: Recent Labs  Lab 01/06/19 2041 01/09/19 1223 01/10/19 0352 01/11/19 0430 01/12/19 0753 01/13/19 0635  AST 226* 268* 171* 98* 53*  --   ALT 94* 175* 133* 100* 71*  --   ALKPHOS 834* 1,031* 778* 749* 711*  --   BILITOT 3.3* 8.0* 9.0* 7.1* 5.0*  --   PROT 5.1* 5.0* 4.9* 4.4* 4.4*  --   ALBUMIN 2.5* 2.1* 2.1* 1.9* 1.8* 1.9*   No results for input(s): LIPASE, AMYLASE in the last 168 hours. No results for input(s): AMMONIA in the last 168 hours. Coagulation Profile: Recent Labs  Lab 01/10/19 0352  INR 1.1   Cardiac Enzymes: No results for input(s): CKTOTAL, CKMB, CKMBINDEX, TROPONINI in the last 168 hours. BNP (last 3 results) No results for input(s): PROBNP in the last 8760 hours. HbA1C: No results for input(s): HGBA1C in the last 72 hours. CBG: No results for input(s): GLUCAP in the last 168 hours. Lipid Profile: No results for input(s): CHOL, HDL, LDLCALC, TRIG, CHOLHDL, LDLDIRECT in the last 72 hours. Thyroid Function Tests:  No results for input(s): TSH, T4TOTAL, FREET4, T3FREE, THYROIDAB in the last 72 hours. Anemia Panel: No results for input(s): VITAMINB12, FOLATE, FERRITIN, TIBC, IRON, RETICCTPCT in the last 72 hours. Sepsis Labs: Recent Labs  Lab 01/06/19 2041  LATICACIDVEN 0.7    Recent Results (from the past 240 hour(s))  Blood Culture (routine x 2)     Status: None   Collection Time: 01/06/19  8:41 PM   Specimen: BLOOD  Result Value Ref Range Status   Specimen Description   Final    BLOOD RIGHT WRIST Performed at [facility] [address] [place], [place] [address]    Special Requests   Final    BOTTLES DRAWN AEROBIC AND ANAEROBIC Blood Culture results may not be optimal due to an excessive volume of blood received in culture bottles Performed at [facility]  [address] [place], [place] [address]    Culture   Final    NO GROWTH 5 DAYS Performed at [facility], [place] [address] [place], [place] [address]    Report Status 01/12/2019 FINAL  Final  Blood Culture (routine x 2)     Status: None   Collection Time: 01/06/19  8:46 PM   Specimen: BLOOD  Result Value Ref Range Status   Specimen Description   Final    BLOOD LEFT WRIST Performed at [facility] [address], [place], [place] [address]    Special Requests   Final    BOTTLES DRAWN AEROBIC AND ANAEROBIC Blood Culture results may not be optimal due to an excessive volume of blood received in culture bottles Performed at [facility] [address] [place], [place] [address]    Culture   Final    NO GROWTH 5 DAYS Performed at [facility], [place] [address] [place], [place] [address]    Report Status 01/12/2019 FINAL  Final  Culture, Blood     Status: None (Preliminary result)   Collection Time: 01/09/19 12:30 PM   Specimen: BLOOD LEFT HAND  Result Value Ref Range Status   Specimen Description   Final    BLOOD LEFT HAND Performed  at [facility], [place] [address], [place], [place] [address]    Special Requests   Final    BOTTLES DRAWN AEROBIC AND ANAEROBIC Blood Culture results may not be optimal due to an inadequate volume of blood received in culture bottles   Culture   Final    NO GROWTH 3 DAYS Performed at [facility], [place] [address] [place], [place] [address]    Report Status PENDING  Incomplete  Culture, Blood     Status: Abnormal   Collection Time: 01/09/19  1:13 PM   Specimen: BLOOD  Result Value Ref Range Status   Specimen Description BLOOD PICC LINE  Final   Special Requests   Final    BOTTLES DRAWN AEROBIC AND ANAEROBIC Blood Culture adequate volume   Culture  Setup Time   Final    GRAM POSITIVE COCCI IN BOTH AEROBIC AND ANAEROBIC BOTTLES CRITICAL RESULT CALLED TO, READ BACK BY AND VERIFIED WITH: PHRMD B GREEN @0529  01/10/19 BY S GEZAHEGN Performed at [facility], [place] [address] [place], [place] [address]    Culture (A)  Final    ENTEROCOCCUS AVIUM STAPHYLOCOCCUS SPECIES (COAGULASE NEGATIVE)    Report Status 01/12/2019 FINAL  Final   Organism ID, Bacteria ENTEROCOCCUS AVIUM  Final      Susceptibility   Enterococcus avium - MIC*    AMPICILLIN <=2 SENSITIVE Sensitive     VANCOMYCIN <=0.5 SENSITIVE Sensitive     GENTAMICIN SYNERGY SENSITIVE Sensitive     * ENTEROCOCCUS AVIUM  Blood Culture ID Panel (Reflexed)     Status: Abnormal   Collection Time: 01/09/19  1:13 PM  Result Value Ref Range Status   Enterococcus species DETECTED (A) NOT DETECTED Final    Comment: CRITICAL RESULT CALLED TO, READ BACK BY AND VERIFIED WITH: PHRMD B GREEN @0529  01/10/19 BY S GEZAHEGN    Vancomycin resistance NOT DETECTED NOT DETECTED Final   Listeria monocytogenes NOT DETECTED NOT DETECTED Final   Staphylococcus species DETECTED (A) NOT DETECTED Final    Comment: Methicillin (oxacillin) resistant coagulase negative staphylococcus. Possible blood culture contaminant (unless  isolated from more than one blood culture draw or clinical  case suggests pathogenicity). No antibiotic treatment is indicated for blood  culture contaminants. CRITICAL RESULT CALLED TO, READ BACK BY AND VERIFIED WITH: PHRMD B GREEN @0529  01/10/19 BY S GEZAHEGN    Staphylococcus aureus (BCID) NOT DETECTED NOT DETECTED Final   Methicillin resistance DETECTED (A) NOT DETECTED Final    Comment: CRITICAL RESULT CALLED TO, READ BACK BY AND VERIFIED WITH: PHRMD B GREEN @0529  01/10/19 BY S GEZAHEGN    Streptococcus species NOT DETECTED NOT DETECTED Final   Streptococcus agalactiae NOT DETECTED NOT DETECTED Final   Streptococcus pneumoniae NOT DETECTED NOT DETECTED Final   Streptococcus pyogenes NOT DETECTED NOT DETECTED Final   Acinetobacter baumannii NOT DETECTED NOT DETECTED Final   Enterobacteriaceae species NOT DETECTED NOT DETECTED Final   Enterobacter cloacae complex NOT DETECTED NOT DETECTED Final   Escherichia coli NOT DETECTED NOT DETECTED Final   Klebsiella oxytoca NOT DETECTED NOT DETECTED Final   Klebsiella pneumoniae NOT DETECTED NOT DETECTED Final   Proteus species NOT DETECTED NOT DETECTED Final   Serratia marcescens NOT DETECTED NOT DETECTED Final   Haemophilus influenzae NOT DETECTED NOT DETECTED Final   Neisseria meningitidis NOT DETECTED NOT DETECTED Final   Pseudomonas aeruginosa NOT DETECTED NOT DETECTED Final   Candida albicans NOT DETECTED NOT DETECTED Final   Candida glabrata NOT DETECTED NOT DETECTED Final   Candida krusei NOT DETECTED NOT DETECTED Final   Candida parapsilosis NOT DETECTED NOT DETECTED Final   Candida tropicalis NOT DETECTED NOT DETECTED Final    Comment: Performed at [facility], [place]. [address], [place], [place] [address]  SARS Coronavirus 2 (CEPHEID - Performed in [facility] lab), Hosp Order     Status: None   Collection Time: 01/09/19  6:07 PM   Specimen: Nasopharyngeal Swab  Result Value Ref Range Status   SARS Coronavirus 2  NEGATIVE NEGATIVE Final    Comment: (NOTE) If result is NEGATIVE SARS-CoV-2 target nucleic acids are NOT DETECTED. The SARS-CoV-2 RNA is generally detectable in upper and lower  respiratory specimens during the acute phase of infection. The lowest  concentration of SARS-CoV-2 viral copies this assay can detect is 250  copies / mL. A negative result does not preclude SARS-CoV-2 infection  and should not be used as the sole basis for treatment or other  patient management decisions.  A negative result may occur with  improper specimen collection / handling, submission of specimen other  than nasopharyngeal swab, presence of viral mutation(s) within the  areas targeted by this assay, and inadequate number of viral copies  (<250 copies / mL). A negative result must be combined with clinical  observations, patient history, and epidemiological information. If result is POSITIVE SARS-CoV-2 target nucleic acids are DETECTED. The SARS-CoV-2 RNA is generally detectable in upper and lower  respiratory specimens dur ing the acute phase of infection.  Positive  results are indicative of active infection with SARS-CoV-2.  Clinical  correlation with patient history and other diagnostic information is  necessary to determine patient infection status.  Positive results do  not rule out bacterial infection or co-infection with other viruses. If result is PRESUMPTIVE POSTIVE SARS-CoV-2 nucleic acids MAY BE PRESENT.   A presumptive positive result was obtained on the submitted specimen  and confirmed on repeat testing.  While 2019 novel coronavirus  (SARS-CoV-2) nucleic acids may be present in the submitted sample  additional confirmatory testing may be necessary for epidemiological  and / or clinical management purposes  to differentiate between  SARS-CoV-2  and other Sarbecovirus currently known to infect humans.  If clinically indicated additional testing with an alternate test  methodology [phone])  is advised. The SARS-CoV-2 RNA is generally  detectable in upper and lower respiratory sp ecimens during the acute  phase of infection. The expected result is Negative. Fact Sheet for Patients:  StrictlyIdeas.no Fact Sheet for Healthcare Providers: BankingDealers.co.za This test is not yet approved or cleared by the [place] FDA and has been authorized for detection and/or diagnosis of SARS-CoV-2 by FDA under an Emergency Use Authorization (EUA).  This EUA will remain in effect (meaning this test can be used) for the duration of the COVID-19 declaration under Section 564(b)(1) of the Act, 21 U.S.C. section 360bbb-3(b)(1), unless the authorization is terminated or revoked sooner. Performed at [facility], [place] [address], [place], [place] [address]   Body fluid culture     Status: None (Preliminary result)   Collection Time: 01/10/19  4:16 PM   Specimen: Peritoneal Fluid  Result Value Ref Range Status   Specimen Description   Final    PERITONEAL Performed at [facility] [address] [place], [place] [address]    Special Requests   Final    NONE Performed at [facility], [place] [address] [place], [place] [address]    Gram Stain   Final    FEW WBC PRESENT,BOTH PMN AND MONONUCLEAR NO ORGANISMS SEEN    Culture   Final    NO GROWTH 3 DAYS Performed at [facility], [place] [address] [place], [place] [address]    Report Status PENDING  Incomplete  Urine culture     Status: None   Collection Time: 01/11/19  4:50 AM   Specimen: Urine, Clean Catch  Result Value Ref Range Status   Specimen Description   Final    URINE, CLEAN CATCH Performed at [facility], [place] [address] [place], [place] [address]    Special Requests   Final    NONE Performed at [facility], [place] [address] [place], [place] [address]    Culture   Final     NO GROWTH Performed at [facility], [place] [address], [place], [place] [address]    Report Status 01/12/2019 FINAL  Final  Culture, blood (routine x 2)     Status: None (Preliminary result)   Collection Time: 01/11/19  4:08 PM   Specimen: BLOOD RIGHT FOREARM  Result Value Ref Range Status   Specimen Description   Final    BLOOD RIGHT FOREARM Performed at [facility] [address], [place], [place] [address]    Special Requests   Final    BOTTLES DRAWN AEROBIC AND ANAEROBIC Blood Culture adequate volume Performed at [facility] [address], [place], [place] [address]    Culture  Setup Time   Final    GRAM POSITIVE COCCI AEROBIC BOTTLE ONLY CRITICAL VALUE NOTED.  VALUE IS CONSISTENT WITH PREVIOUSLY REPORTED AND CALLED VALUE. Performed at [facility], [place] [address] [place], [place] [address]    Culture GRAM POSITIVE COCCI  Final   Report Status PENDING  Incomplete  Culture, blood (routine x 2)     Status: None (Preliminary result)   Collection Time: 01/11/19  4:08 PM   Specimen: BLOOD RIGHT FOREARM  Result Value Ref Range Status   Specimen Description   Final    BLOOD RIGHT FOREARM Performed at [facility], [place] [address] [place], [place] [address]    Special Requests   Final    BOTTLES DRAWN AEROBIC AND ANAEROBIC Blood Culture adequate volume Performed at [facility] [address] [place], [place] [address]    Culture   Final    NO GROWTH < 24 HOURS Performed at [facility] [address] [place], [place] [address]    Report Status PENDING  Incomplete    Radiology Studies: No results found.   Scheduled Meds: . amLODipine  10 mg Oral QHS  . doxazosin  1 mg Oral QHS  . gabapentin  200 mg Oral BID  . heparin  5,000 Units Subcutaneous Q8H  . indomethacin  100 mg Rectal Once  . irbesartan  300 mg Oral Daily  . magnesium oxide  400 mg Oral BID  . potassium  chloride SA  20 mEq Oral BID  . sodium chloride flush  10-40 mL Intracatheter Q12H   Continuous Infusions: . sodium chloride 10 mL/hr at 01/13/19 0400  . anidulafungin Stopped (01/12/19 2232)  . lactated ringers 1,000 mL (01/10/19 1147)  . meropenem (MERREM) IV 1 g (01/13/19 0526)     LOS: 4 days    Time spent: 25 minutes spent in the coordination of care today.    Jonnie Finner, DO [facility] Pager [phone]  If 7PM-7AM, please contact night-coverage [email or web address] Password [email or web address] 01/13/2019, 1:23 PM

## 2019-01-13 NOTE — Consult Note (Signed)
Consultation Note Date: 01/13/2019   Patient Name: Cory Kelly  DOB: 02-Apr-1956  MRN: [id number]  Age / Sex: 63 y.o., male  PCP: Mancel Bale, PA-C Referring Physician: Jonnie Finner, DO  Reason for Consultation: Introduction to Palliative Care Services, Advance Care Planning HPI/Patient Profile:  Cory Kelly is a 63 yo man with pancreatic cancer diagnosed 08/2017 s/p multiple cycles of chemotherapy and subsequent stenting of his pancreatic duct and biliary segments. Most recently he has been hospitalized with step bacteremia and now has enterococcal bacteremia, requires paracentesis for ascites and imaging has shown extensive tumor growth into the stent and hepatobiliary system. Oncology is awaiting cytology from paracentesis fluid to determine if additional chemotherapy is an option.  Clinical Assessment and Goals of Care: Progressive Pancreatic Cancer, but overall he appears strong and independent, he has few complaints, minimal pain and no other difficult symptoms.  I introduced the concept of palliative care and services we could provide. He is open to conversation, but clear that he is still actively going to pursue any possible treatment options for his cancer. I reassured him I had no agenda other than to support him and to support his hope. I did stress the importance of planning in case things did not go the way he is hoping. He has great support from family and friends and his goal is to be able to spend as much time as possible with his two granddaughters who are 58 and 56 years old.  Additionally he requested conversation to include his wife, and I agree with him that this would best be done in person.   SUMMARY OF RECOMMENDATIONS   1. Continued conversation re: treatment options and goals of care. Consistent messaging is important to him and to have clear answers-he wants to make sure he understands his  current state and what to expect moving forward.  2. We did not address ACP today or code status, I will provide him with information on this for his review and will need to include his wife on that conversation.  3. I briefly introduced hospice care. He has prior experience with his father.  4. Will provide additional information on PC and our contact number.  5. Depending on how he does during his hospitalization, [facility] can follow-up with him at home after hospitalization.     Primary Diagnoses: Present on Admission: . Falls . Essential hypertension . Hyperlipemia . Elevated LFTs . 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[place]) . Streptococcal bacteremia . Candidemia ([place])   I have reviewed the medical record, interviewed the patient and family, and examined the patient. The following aspects are pertinent.  Past Medical History:  Diagnosis Date  . Abdominal pain    due to bloating  . Bloating   . Cancer ([place])    skin cancer  . Essential hypertension   . Fatigue   . History of kidney stones    passed  . History of weight loss   . HOH (hard of hearing)    no hearing aids  .  Neuralgia   . Pancreatic cancer ([place])   . Stress    loss of father in Dec 2018.   Social History   Socioeconomic History  . Marital status: Married    Spouse name: Not on file  . Number of children: Not on file  . Years of education: Not on file  . Highest education level: Not on file  Occupational History    Employer: [employer]  . Financial resource strain: Not on file  . Food insecurity    Worry: Not on file    Inability: Not on file  . Transportation needs    Medical: Not on file    Non-medical: Not on file  Tobacco Use  . Smoking status: Never Smoker  . Smokeless tobacco: Never Used  Substance and Sexual Activity  . Alcohol use: No  . Drug use: No  . Sexual activity: Yes    Partners: Female    Birth control/protection: None  Lifestyle  . Physical activity     Days per week: Not on file    Minutes per session: Not on file  . Stress: Not on file  Relationships  . Social Herbalist on phone: Not on file    Gets together: Not on file    Attends religious service: Not on file    Active member of club or organization: Not on file    Attends meetings of clubs or organizations: Not on file    Relationship status: Not on file  Other Topics Concern  . Not on file  Social History Narrative   Married   2 children, 2 grand children   Coaches womens basketball at [employer] History  Problem Relation Age of Onset  . Hyperlipidemia Father   . Heart disease Father   . Dementia Father   . Memory loss Mother   . Colon cancer Neg Hx   . Esophageal cancer Neg Hx   . Inflammatory bowel disease Neg Hx   . Liver disease Neg Hx   . Rectal cancer Neg Hx   . Stomach cancer Neg Hx   . Pancreatic cancer Neg Hx    Scheduled Meds: . amLODipine  10 mg Oral QHS  . doxazosin  1 mg Oral QHS  . gabapentin  200 mg Oral BID  . heparin  5,000 Units Subcutaneous Q8H  . indomethacin  100 mg Rectal Once  . irbesartan  300 mg Oral Daily  . magnesium oxide  400 mg Oral BID  . potassium chloride SA  20 mEq Oral BID  . sodium chloride flush  10-40 mL Intracatheter Q12H   Continuous Infusions: . sodium chloride 10 mL/hr at 01/13/19 0400  . anidulafungin Stopped (01/12/19 2232)  . lactated ringers 1,000 mL (01/10/19 1147)  . meropenem (MERREM) IV 1 g (01/13/19 0526)   PRN Meds:.sodium chloride, diphenhydrAMINE, hydrocortisone, loperamide, oxyCODONE-acetaminophen **AND** oxyCODONE, prochlorperazine, sodium chloride flush Medications Prior to Admission:  Prior to Admission medications   Medication Sig Start Date End Date Taking? Authorizing Provider  amLODipine (NORVASC) 10 MG tablet Take 10 mg by mouth at bedtime.  05/21/18  Yes [provider]  caspofungin (CANCIDAS) IVPB Inject 50 mg into the vein daily. Indication:  polymicrobial sepsis, including C. glabrata Last Day of Therapy: 02/09/2019 Labs weekly while on IV antibiotics: _X_ CBC with differential _X_ CMP 01/05/19 02/09/19 Yes Rai, Ripudeep K, MD  doxazosin (CARDURA) 1 MG tablet Take 1 mg by mouth at bedtime.  Yes [provider]  ertapenem (INVANZ) IVPB Inject 1 g into the vein daily for 21 days. Indication: polymicrobial sepsis with Grp C strep, S. anginosis, bacteroides.  Last Day of Therapy:  01/26/2019 Labs weekly while on IV antibiotics: _X_ CBC with differential _X_ CMP 01/05/19 01/26/19 Yes Rai, Ripudeep K, MD  ferrous sulfate 325 (65 FE) MG tablet Take 1 tablet (325 mg total) by mouth 2 (two) times daily with a meal. 01/04/19  Yes Rai, Ripudeep K, MD  gabapentin (NEURONTIN) 100 MG capsule Take 2 capsules (200 mg total) by mouth 2 (two) times daily. 12/03/18  Yes Owens Shark, NP  lidocaine-prilocaine (EMLA) cream Apply 1 application topically as needed. Apply to portacath site 1-2 hours prior to use Patient taking differently: Apply 1 application topically daily as needed (1-2 hours prior to portacath access).  08/20/18  Yes Owens Shark, NP  loperamide (IMODIUM A-D) 2 MG tablet Take 1 tablet (2 mg total) by mouth as directed. Patient taking differently: Take 2-4 mg by mouth 3 (three) times daily as needed for diarrhea or loose stools.  08/22/18  Yes Ladell Pier, MD  olmesartan (BENICAR) 40 MG tablet Take 1 tablet (40 mg total) by mouth daily. 04/04/18  Yes Weber, Damaris Hippo, PA-C  oxyCODONE-acetaminophen (PERCOCET) 10-325 MG tablet Take 1 tablet by mouth every 4 (four) hours as needed for pain. 12/28/18  Yes Owens Shark, NP  potassium chloride SA (K-DUR) 20 MEQ tablet Take 1 tablet (20 mEq total) by mouth 2 (two) times daily. 12/28/18  Yes Ladell Pier, MD  [facility] ACETATE EX Apply 1 application topically daily as needed (itching).   Yes [provider]  Probiotic Product (ALIGN) 4 MG CAPS Take 1 capsule by mouth daily.    Yes [provider]  guaiFENesin-dextromethorphan (ROBITUSSIN DM) 100-10 MG/5ML syrup Take 5 mLs by mouth every 4 (four) hours as needed for cough (chest congestion). 01/04/19   Rai, Vernelle Emerald, MD  hydrocortisone (ANUSOL-HC) 2.5 % rectal cream Apply 1 application topically 4 (four) times daily as needed for hemorrhoids. 01/04/19   Rai, Vernelle Emerald, MD  prochlorperazine (COMPAZINE) 10 MG tablet Take 1 tablet (10 mg total) by mouth every 6 (six) hours as needed for nausea or vomiting. 01/04/19   Rai, Vernelle Emerald, MD  tamsulosin (FLOMAX) 0.4 MG CAPS capsule Take 1 capsule (0.4 mg total) by mouth daily after supper. Patient not taking: Reported on 01/06/2019 12/27/18   Damita Lack, MD   No Known Allergies Review of Systems  Physical Exam  Vital Signs: BP 124/81 (BP Location: Right Arm)   Pulse 72   Temp 97.6 F (36.4 C) (Oral)   Resp 19   Ht 6\' 5"  (1.956 m)   Wt 88.6 kg   SpO2 94%   BMI 23.16 kg/m  Pain Scale: 0-10 POSS *See Group Information*: S-Acceptable,Sleep, easy to arouse Pain Score: Asleep   SpO2: SpO2: 94 % O2 Device:SpO2: 94 % O2 Flow Rate: .O2 Flow Rate (L/min): 6 L/min  IO: Intake/output summary:   Intake/Output Summary (Last 24 hours) at 01/13/2019 0944 Last data filed at 01/13/2019 0400 Gross per 24 hour  Intake 1611.72 ml  Output -  Net 1611.72 ml    LBM: Last BM Date: 01/11/19 Baseline Weight: Weight: 88.6 kg Most recent weight: Weight: 88.6 kg        Time Total: 35 min Greater than 50%  of this time was spent counseling and coordinating care related to the above assessment  and plan.  Signed by: Lane Hacker, DO   Please contact Palliative Medicine Team phone at [phone] for questions and concerns.  For individual provider: See Shea Evans

## 2019-01-13 NOTE — Progress Notes (Signed)
These preliminary result these preliminary results were noted.  Awaiting final report.

## 2019-01-13 NOTE — Progress Notes (Signed)
[facility]   Reason for visit: Follow up on polymicrobial sepsis  Antimicrobials: Meropenem, day 4 + 8 days of ertapenem Anidulafungin, day 12  Interval History: appetite slowly improving. He did ambulate in hallways with PT this morning. Pt elected to defer hospice at this time. He is anxious to have PICC replaced and go home tomorrow.  Blood cxs, fever curve, labs, and medications all independently reviewed    Current Facility-Administered Medications:  .  0.9 %  sodium chloride infusion, , Intravenous, PRN, Marylyn Ishihara, Tyrone A, DO, Last Rate: 10 mL/hr at 01/13/19 0400 .  amLODipine (NORVASC) tablet 10 mg, 10 mg, Oral, QHS, Milus Banister, MD, 10 mg at 01/12/19 2204 .  anidulafungin (ERAXIS) 100 mg in sodium chloride 0.9 % 100 mL IVPB, 100 mg, Intravenous, Q24H, Milus Banister, MD, Stopped at 01/12/19 2232 .  diphenhydrAMINE (BENADRYL) capsule 25 mg, 25 mg, Oral, Q6H PRN, Lennox Grumbles, Belinda K, NP, 25 mg at 01/12/19 2204 .  doxazosin (CARDURA) tablet 1 mg, 1 mg, Oral, QHS, Milus Banister, MD, 1 mg at 01/12/19 2204 .  gabapentin (NEURONTIN) capsule 200 mg, 200 mg, Oral, BID, Milus Banister, MD, 200 mg at 01/13/19 1015 .  heparin injection 5,000 Units, 5,000 Units, Subcutaneous, Q8H, Milus Banister, MD, 5,000 Units at 01/13/19 1350 .  hydrocortisone (ANUSOL-HC) 2.5 % rectal cream 1 application, 1 application, Topical, QID PRN, Milus Banister, MD .  indomethacin (INDOCIN) 50 MG suppository 100 mg, 100 mg, Rectal, Once, Milus Banister, MD .  irbesartan Levy Sjogren) tablet 300 mg, 300 mg, Oral, Daily, Milus Banister, MD, 300 mg at 01/13/19 1015 .  lactated ringers infusion, , Intravenous, Continuous, Milus Banister, MD, Last Rate: 10 mL/hr at 01/10/19 1147 .  loperamide (IMODIUM) capsule 2-4 mg, 2-4 mg, Oral, TID PRN, Milus Banister, MD .  magnesium oxide (MAG-OX) tablet 400 mg, 400 mg, Oral, BID, Milus Banister, MD, 400 mg at 01/13/19 1015 .  meropenem  (MERREM) 1 g in sodium chloride 0.9 % 100 mL IVPB, 1 g, Intravenous, Q8H, Milagros Middendorf, Evern Core, MD, Last Rate: 200 mL/hr at 01/13/19 1354, 1 g at 01/13/19 1354 .  oxyCODONE-acetaminophen (PERCOCET/ROXICET) 5-325 MG per tablet 1 tablet, 1 tablet, Oral, Q4H PRN, 1 tablet at 01/11/19 2202 **AND** oxyCODONE (Oxy IR/ROXICODONE) immediate release tablet 5 mg, 5 mg, Oral, Q4H PRN, Milus Banister, MD, 5 mg at 01/13/19 1350 .  potassium chloride SA (K-DUR) CR tablet 20 mEq, 20 mEq, Oral, BID, Milus Banister, MD, 20 mEq at 01/13/19 1015 .  prochlorperazine (COMPAZINE) tablet 10 mg, 10 mg, Oral, Q6H PRN, Milus Banister, MD, 10 mg at 01/11/19 1250 .  sodium chloride flush (NS) 0.9 % injection 10-40 mL, 10-40 mL, Intracatheter, Q12H, Milus Banister, MD, 10 mL at 01/12/19 0959 .  sodium chloride flush (NS) 0.9 % injection 10-40 mL, 10-40 mL, Intracatheter, PRN, Milus Banister, MD, 10 mL at 01/11/19 2152   Physical Exam:   Vitals:   01/13/19 0526 01/13/19 1332  BP: 124/81 122/82  Pulse: 72 72  Resp: 19 16  Temp: 97.6 F (36.4 C) 98.6 F (37 C)  SpO2: 94% 96%   Physical Exam Gen: pleasant, NAD, A&Ox 3, jaundice improving Head: NCAT, no temporal wasting evident EENT: PERRL, EOMI, MMM, mild scleral icterus, adequate dentition Neck: supple, no JVD CV: NRRR, no murmurs evident Pulm: CTA bilaterally, no wheeze or retractions Abd: soft, mild RUQ TTP OTW NTND, +  BS Extrems: trace LE edema, 2+ pulses Skin: no rashes, adequate skin turgor Neuro: CN II-XII grossly intact, no focal neurologic deficits appreciated, gait was not assessed, A&Ox 3   Review of Systems:  Review of Systems  Constitutional: Negative for chills, fever and weight loss.  HENT: Negative for congestion, hearing loss, sinus pain and sore throat.   Eyes: Negative for blurred vision, photophobia and discharge.  Respiratory: Negative for cough, hemoptysis and shortness of breath.   Cardiovascular: Negative for chest pain,  palpitations, orthopnea and leg swelling.  Gastrointestinal: Positive for nausea. Negative for abdominal pain, constipation, diarrhea, heartburn and vomiting.  Genitourinary: Negative for dysuria, flank pain, frequency and urgency.  Musculoskeletal: Negative for back pain, joint pain and myalgias.  Skin: Positive for itching. Negative for rash.  Neurological: Negative for tremors, seizures, weakness and headaches.  Endo/Heme/Allergies: Negative for polydipsia. Does not bruise/bleed easily.  Psychiatric/Behavioral: Negative for depression and substance abuse. The patient is nervous/anxious and has insomnia.      Lab Results  Component Value Date   WBC 2.0 (L) 01/13/2019   HGB 8.4 (L) 01/13/2019   HCT 27.2 (L) 01/13/2019   MCV 89.2 01/13/2019   PLT 90 (L) 01/13/2019    Lab Results  Component Value Date   CREATININE 0.47 (L) 01/13/2019   BUN 10 01/13/2019   NA 131 (L) 01/13/2019   K 3.9 01/13/2019   CL 96 (L) 01/13/2019   CO2 28 01/13/2019    Lab Results  Component Value Date   ALT 71 (H) 01/12/2019   AST 53 (H) 01/12/2019   ALKPHOS 711 (H) 01/12/2019     Microbiology: Recent Results (from the past 240 hour(s))  Blood Culture (routine x 2)     Status: None   Collection Time: 01/06/19  8:41 PM   Specimen: BLOOD  Result Value Ref Range Status   Specimen Description   Final    BLOOD RIGHT WRIST Performed at [facility], [place] [address], [place], [place] [address]    Special Requests   Final    BOTTLES DRAWN AEROBIC AND ANAEROBIC Blood Culture results may not be optimal due to an excessive volume of blood received in culture bottles Performed at [place] [address] [place], [place] [address]    Culture   Final    NO GROWTH 5 DAYS Performed at [facility], [place] [address] [place], [place] [address]    Report Status 01/12/2019 FINAL  Final  Blood Culture (routine x 2)     Status: None   Collection Time: 01/06/19  8:46  PM   Specimen: BLOOD  Result Value Ref Range Status   Specimen Description   Final    BLOOD LEFT WRIST Performed at [place] [address], [place], [place] [address]    Special Requests   Final    BOTTLES DRAWN AEROBIC AND ANAEROBIC Blood Culture results may not be optimal due to an excessive volume of blood received in culture bottles Performed at [place] [address], [place], [place] [address]    Culture   Final    NO GROWTH 5 DAYS Performed at [facility], [place] [address], [place], [place] [address]    Report Status 01/12/2019 FINAL  Final  Culture, Blood     Status: None (Preliminary result)   Collection Time: 01/09/19 12:30 PM   Specimen: BLOOD LEFT HAND  Result Value Ref Range Status   Specimen  Description   Final    BLOOD LEFT HAND Performed at [facility], [place] [address], [place], [place] [address]    Special Requests   Final    BOTTLES DRAWN AEROBIC AND ANAEROBIC Blood Culture results may not be optimal due to an inadequate volume of blood received in culture bottles   Culture   Final    NO GROWTH 3 DAYS Performed at [facility], [place] [address], [place], [place] [address]    Report Status PENDING  Incomplete  Culture, Blood     Status: Abnormal   Collection Time: 01/09/19  1:13 PM   Specimen: BLOOD  Result Value Ref Range Status   Specimen Description BLOOD PICC LINE  Final   Special Requests   Final    BOTTLES DRAWN AEROBIC AND ANAEROBIC Blood Culture adequate volume   Culture  Setup Time   Final    GRAM POSITIVE COCCI IN BOTH AEROBIC AND ANAEROBIC BOTTLES CRITICAL RESULT CALLED TO, READ BACK BY AND VERIFIED WITH: PHRMD B GREEN '@0529'$  01/10/19 BY S GEZAHEGN Performed at [facility], [place] [address] [place], [place] [address]    Culture (A)  Final    ENTEROCOCCUS AVIUM STAPHYLOCOCCUS SPECIES (COAGULASE NEGATIVE)    Report Status 01/12/2019 FINAL  Final   Organism  ID, Bacteria ENTEROCOCCUS AVIUM  Final      Susceptibility   Enterococcus avium - MIC*    AMPICILLIN <=2 SENSITIVE Sensitive     VANCOMYCIN <=0.5 SENSITIVE Sensitive     GENTAMICIN SYNERGY SENSITIVE Sensitive     * ENTEROCOCCUS AVIUM  Blood Culture ID Panel (Reflexed)     Status: Abnormal   Collection Time: 01/09/19  1:13 PM  Result Value Ref Range Status   Enterococcus species DETECTED (A) NOT DETECTED Final    Comment: CRITICAL RESULT CALLED TO, READ BACK BY AND VERIFIED WITH: PHRMD B GREEN '@0529'$  01/10/19 BY S GEZAHEGN    Vancomycin resistance NOT DETECTED NOT DETECTED Final   Listeria monocytogenes NOT DETECTED NOT DETECTED Final   Staphylococcus species DETECTED (A) NOT DETECTED Final    Comment: Methicillin (oxacillin) resistant coagulase negative staphylococcus. Possible blood culture contaminant (unless isolated from more than one blood culture draw or clinical case suggests pathogenicity). No antibiotic treatment is indicated for blood  culture contaminants. CRITICAL RESULT CALLED TO, READ BACK BY AND VERIFIED WITH: PHRMD B GREEN '@0529'$  01/10/19 BY S GEZAHEGN    Staphylococcus aureus (BCID) NOT DETECTED NOT DETECTED Final   Methicillin resistance DETECTED (A) NOT DETECTED Final    Comment: CRITICAL RESULT CALLED TO, READ BACK BY AND VERIFIED WITH: PHRMD B GREEN '@0529'$  01/10/19 BY S GEZAHEGN    Streptococcus species NOT DETECTED NOT DETECTED Final   Streptococcus agalactiae NOT DETECTED NOT DETECTED Final   Streptococcus pneumoniae NOT DETECTED NOT DETECTED Final   Streptococcus pyogenes NOT DETECTED NOT DETECTED Final   Acinetobacter baumannii NOT DETECTED NOT DETECTED Final   Enterobacteriaceae species NOT DETECTED NOT DETECTED Final   Enterobacter cloacae complex NOT DETECTED NOT DETECTED Final   Escherichia coli NOT DETECTED NOT DETECTED Final   Klebsiella oxytoca NOT DETECTED NOT DETECTED Final   Klebsiella pneumoniae NOT DETECTED NOT DETECTED Final   Proteus species NOT  DETECTED NOT DETECTED Final   Serratia marcescens NOT DETECTED NOT DETECTED Final   Haemophilus influenzae NOT DETECTED NOT DETECTED Final   Neisseria meningitidis NOT DETECTED NOT DETECTED Final   Pseudomonas aeruginosa NOT DETECTED NOT DETECTED Final   Candida  albicans NOT DETECTED NOT DETECTED Final   Candida glabrata NOT DETECTED NOT DETECTED Final   Candida krusei NOT DETECTED NOT DETECTED Final   Candida parapsilosis NOT DETECTED NOT DETECTED Final   Candida tropicalis NOT DETECTED NOT DETECTED Final    Comment: Performed at [facility], [place] [address], [place], [place] [address]  SARS Coronavirus 2 (CEPHEID - Performed in [facility]), Hosp Order     Status: None   Collection Time: 01/09/19  6:07 PM   Specimen: Nasopharyngeal Swab  Result Value Ref Range Status   SARS Coronavirus 2 NEGATIVE NEGATIVE Final    Comment: (NOTE) If result is NEGATIVE SARS-CoV-2 target nucleic acids are NOT DETECTED. The SARS-CoV-2 RNA is generally detectable in upper and lower  respiratory specimens during the acute phase of infection. The lowest  concentration of SARS-CoV-2 viral copies this assay can detect is 250  copies / mL. A negative result does not preclude SARS-CoV-2 infection  and should not be used as the sole basis for treatment or other  patient management decisions.  A negative result may occur with  improper specimen collection / handling, submission of specimen other  than nasopharyngeal swab, presence of viral mutation(s) within the  areas targeted by this assay, and inadequate number of viral copies  (<250 copies / mL). A negative result must be combined with clinical  observations, patient history, and epidemiological information. If result is POSITIVE SARS-CoV-2 target nucleic acids are DETECTED. The SARS-CoV-2 RNA is generally detectable in upper and lower  respiratory specimens dur ing the acute phase of infection.  Positive  results are indicative of  active infection with SARS-CoV-2.  Clinical  correlation with patient history and other diagnostic information is  necessary to determine patient infection status.  Positive results do  not rule out bacterial infection or co-infection with other viruses. If result is PRESUMPTIVE POSTIVE SARS-CoV-2 nucleic acids MAY BE PRESENT.   A presumptive positive result was obtained on the submitted specimen  and confirmed on repeat testing.  While 2019 novel coronavirus  (SARS-CoV-2) nucleic acids may be present in the submitted sample  additional confirmatory testing may be necessary for epidemiological  and / or clinical management purposes  to differentiate between  SARS-CoV-2 and other Sarbecovirus currently known to infect humans.  If clinically indicated additional testing with an alternate test  methodology [phone]) is advised. The SARS-CoV-2 RNA is generally  detectable in upper and lower respiratory sp ecimens during the acute  phase of infection. The expected result is Negative. Fact Sheet for Patients:  StrictlyIdeas.no Fact Sheet for Healthcare Providers: BankingDealers.co.za This test is not yet approved or cleared by the [place] FDA and has been authorized for detection and/or diagnosis of SARS-CoV-2 by FDA under an Emergency Use Authorization (EUA).  This EUA will remain in effect (meaning this test can be used) for the duration of the COVID-19 declaration under Section 564(b)(1) of the Act, 21 U.S.C. section 360bbb-3(b)(1), unless the authorization is terminated or revoked sooner. Performed at [facility], [place] [address], [place], [place] [address]   Body fluid culture     Status: None (Preliminary result)   Collection Time: 01/10/19  4:16 PM   Specimen: Peritoneal Fluid  Result Value Ref Range Status   Specimen Description   Final    PERITONEAL Performed at [facility]  [address], [place], [place] [address]    Special Requests   Final    NONE Performed  at [facility], [place] [address] [place], [place] [address]    Gram Stain   Final    FEW WBC PRESENT,BOTH PMN AND MONONUCLEAR NO ORGANISMS SEEN    Culture   Final    NO GROWTH 3 DAYS Performed at [facility], [place] [address], [place], [place] [address]    Report Status PENDING  Incomplete  Urine culture     Status: None   Collection Time: 01/11/19  4:50 AM   Specimen: Urine, Clean Catch  Result Value Ref Range Status   Specimen Description   Final    URINE, CLEAN CATCH Performed at [facility], [place] [address], [place], [place] [address]    Special Requests   Final    NONE Performed at [facility], [place] [address], [place], [place] [address]    Culture   Final    NO GROWTH Performed at [facility], [place] [address] [place], [place] [address]    Report Status 01/12/2019 FINAL  Final  Culture, blood (routine x 2)     Status: Abnormal (Preliminary result)   Collection Time: 01/11/19  4:08 PM   Specimen: BLOOD RIGHT FOREARM  Result Value Ref Range Status   Specimen Description   Final    BLOOD RIGHT FOREARM Performed at [place] [address], [place], [place] [address]    Special Requests   Final    BOTTLES DRAWN AEROBIC AND ANAEROBIC Blood Culture adequate volume Performed at [facility] [address] [place], [place]  [address]    Culture  Setup Time   Final    GRAM POSITIVE COCCI AEROBIC BOTTLE ONLY CRITICAL VALUE NOTED.  VALUE IS CONSISTENT WITH PREVIOUSLY REPORTED AND CALLED VALUE.    Culture (A)  Final    ENTEROCOCCUS AVIUM SUSCEPTIBILITIES TO FOLLOW Performed at [facility], [place] [address], [place], [place] [address]    Report Status PENDING  Incomplete  Culture, blood (routine x 2)     Status: None (Preliminary result)   Collection Time: 01/11/19  4:08 PM    Specimen: BLOOD RIGHT FOREARM  Result Value Ref Range Status   Specimen Description   Final    BLOOD RIGHT FOREARM Performed at [place] [address] [place], [place] [address]    Special Requests   Final    BOTTLES DRAWN AEROBIC AND ANAEROBIC Blood Culture adequate volume Performed at [place] [address] [place], [place] [address]    Culture   Final    NO GROWTH < 24 HOURS Performed at [place] [address], [place], [place] [address]    Report Status PENDING  Incomplete   OPAT ORDERS:  Diagnosis: polymicrobial sepsis/fungemia  Culture Result: GCS, Strep anginosus, Candida glabrata, Bacteroides fragilis, and Enterococcus avium.  No Known Allergies  Discharge antibiotics: Per pharmacy protocol meropenem 1 gm IV q 8 hrs AND caspofungin 50 mg IV daily  Duration/End Date: Meropenem until 02/06/2019 (4 weeks) Caspofungin until 02/20/2019 (6 weeks)   [facility] Care and Maintenance Per Protocol __ Please pull PIC at completion of IV antibiotics _X_ Please leave PIC in place until doctor has seen patient or been notified  Labs weekly while on IV antibiotics: _X_ CBC with differential __ BMP __ BMP TWICE WEEKLY** _X_ CMP __ CRP __ ESR __ Vancomycin trough  Fax weekly labs to [phone]  Clinic Follow Up Appt: 01/21/2019 at  10:30 AM @ [facility] with Dr. Prince Rome  Impression: The patient is a 63 y/o WM with advanced pancreatic CA, known liver metastases, s/p bare metal biliary stenting who has had recurring admissions over the past 2 months with polymicrobial bloodstream infection.  He had group C strep bacteremia in early May treated with 2 weeks of ceftriaxone.  He was readmitted in mid-June with strep anginosis and Bacteroides bacteremia.  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  I suspect that his infections are coming from a hepatobiliary source (his  tumor has been confirmed to have compressed his prior biliary stent closed disrupting the normal flora, allowing it to invade the vasculature, and requiring placement of a second bare metal stent to his biliary tract).  His PICC line was again removed on 01/11/2019 and would wait to insert a new PICC until blood cultures are negative after 72 hrs. Antimicrobial susceptibility testing of his Candida glabrata showed dose-dependent resistance to azoles. Anidulafungin was confirmed as sensitive. Based on current guidelines he should have an ophthalmologic exam sometime within the next 2 weeks to rule out endophthalmitis and help guide duration of antifungal therapy. Given confirmation of his tumor invasion as the cause of his recurrent bacteremias/fungemias, his risk for further recurrences remains quite high over time. His recent CDT was negative for Ag and toxin, making CDI unlikely. Suspect his watery diarrhea is more pancreatic in nature.  PLAN: 1. Continue meropenem and anidulafungin. As stated above, duration of treatment is unclear at this time while his repeat blood cx results are pending. As his repeat echo showed no abnormalities, he would likley be best served with 4 weeks of ertapenem and 6 weeks of anidulafungin from time of established clearance of blood borne pathogen. Tentative d/c date for meropenem will be 02/06/2019 and tentative anidulafungin d/c date will be 02/20/2019. > 20 minutes of time spent on d/c planning alone today. 2. Concern remains that his biliary stents will be the nidus of his infection but cannot be exchanged further. Once these infections become fungal in nature, efforts to prevent recurrence often fail unless the device can be removed or exchanged. 3. Removed PICC on 01/11/2019...delay placement of new PICC after repeat blood cxs are negative at 72 hrs (should be the case by tomorrow AM). 4. Recommend ophthalmologic evaluation 5. Will need to discuss nature of chemo  re-initiation with his oncologist as currently on hold due to his infection. If future chemo is immunosuppressive or likely to induce neutropenia, premature resumption of chemo could worsen prognosis (fungemia alone carries a 50% mortality risk). He seems unwilling to consider palliation at this time but did meet with the palliative care service yesterday. I am unclear of his oncologic prognosis, so will defer this discussion to them. I spoke with the patient and his wife on the phone on Friday. She seems to understand the gravity of his illness and high likelihood of imminent death, but the patient does not seem to accept his fate at this time. She expressly asked about his mortality risks with infection and his CA treatment and understands that this will likely change his oncologic treatment and all-cause mortality.

## 2019-01-13 NOTE — Evaluation (Signed)
Physical Therapy Evaluation Patient Details Name: Cory Kelly MRN: [id number] DOB: 01-11-1956 Today's Date: 01/13/2019   History of Present Illness  Del Overfelt is a 63 y.o. male with medical history significant of HTN, pancreatic cancer, bacteremia. He presents w/ complaints of generalized weakness and falls. He reports multiple falls over the last several months. They generally do not involve chest pain, palpitations, dizziness, LOC. He reports that his most recent fall was 2 days prior to admission, but this was due to tripping over his dog. He says that he generally feels unsteady on his feet.    Clinical Impression  Pt admitted with above diagnosis. Pt currently with functional limitations due to the deficits listed below (see PT Problem List). Patient modified independent with increased time and pain for bed mobility, however had HOB elevated and heavy use of bedrail. Patient requires min guard to min assist for transfer and ambulation for safety. Patient did have one gross LOB while looking to the side and turning right. Patient was able to recover balance but PT did need to use gait belt for safety.  Pt will benefit from skilled PT to increase their independence and safety with mobility to allow discharge to the venue listed below.  .    Follow Up Recommendations Home health PT;Supervision for mobility/OOB;Outpatient PT;Other (comment)(Patient refused OPPT and HHPT stating he had access to PT and athletic training through [employer] where he works.)    Equipment Recommendations  Cane(patient 6'5")    Recommendations for Other Services       Precautions / Restrictions Precautions Precautions: Fall Restrictions Weight Bearing Restrictions: No      Mobility  Bed Mobility Overal bed mobility: Modified Independent             General bed mobility comments: slow, painful, use of bedrails, HOB elevated  Transfers Overall transfer level: Modified independent    Transfers: Sit to/from [facility] Sit to Stand: Min guard Stand pivot transfers: Min guard          Ambulation/Gait Ambulation/Gait assistance: Min guard;Min assist Gait Distance (Feet): 150 Feet Assistive device: None Gait Pattern/deviations: Step-through pattern;Decreased step length - right;Decreased step length - left;Decreased stride length;Narrow base of support;Drifts right/left Gait velocity: decreased   General Gait Details: 1 gross LOB recovering independently while turning head while ambulating during a right hand turn in hallway  Stairs            Wheelchair Mobility    Modified Rankin (Stroke Patients Only)       Balance Overall balance assessment: Mild deficits observed, not formally tested;Needs assistance Sitting-balance support: Bilateral upper extremity supported;Feet supported Sitting balance-Leahy Scale: Good     Standing balance support: No upper extremity supported;During functional activity Standing balance-Leahy Scale: Fair                               Pertinent Vitals/Pain Pain Assessment: 0-10 Pain Score: 0-No pain Pain Location: pain with movement  - RLQ ribs - sharp Pain Intervention(s): Limited activity within patient's tolerance;Monitored during session    [place] expects to be discharged to:: Private residence Living Arrangements: Spouse/significant other Available Help at Discharge: Family Type of Home: House Home Access: Stairs to enter Entrance Stairs-Rails: Chemical engineer of Steps: 4 Home Layout: One level Home Equipment: None Additional Comments: patient is 6'5"    Prior Function Level of Independence: Independent  Comments: Pt reports walking 2 miles/day with his dog, states he stays active at home.     Hand Dominance   Dominant Hand: Right    Extremity/Trunk Assessment        Lower Extremity Assessment Lower Extremity  Assessment: Overall WFL for tasks assessed       Communication   Communication: No difficulties  Cognition Arousal/Alertness: Awake/alert Behavior During Therapy: WFL for tasks assessed/performed Overall Cognitive Status: Within Functional Limits for tasks assessed                                 General Comments: appears wfls, has been confused per chart.  Thought he had broken ribs      General Comments      Exercises     Assessment/Plan    PT Assessment Patient needs continued PT services  PT Problem List Decreased strength;Pain;Decreased activity tolerance;Decreased balance;Decreased mobility;Decreased knowledge of use of DME;Decreased cognition       PT Treatment Interventions DME instruction;Therapeutic exercise;Gait training;Balance training;Stair training;Therapeutic activities;Patient/family education;Cognitive remediation    PT Goals (Current goals can be found in the Care Plan section)  Acute Rehab PT Goals Patient Stated Goal: get strength back PT Goal Formulation: With patient Time For Goal Achievement: 01/27/19 Potential to Achieve Goals: Fair    Frequency Min 2X/week   Barriers to discharge        Co-evaluation               AM-PAC PT "6 Clicks" Mobility  Outcome Measure Help needed turning from your back to your side while in a flat bed without using bedrails?: A Little Help needed moving from lying on your back to sitting on the side of a flat bed without using bedrails?: A Little Help needed moving to and from a bed to a chair (including a wheelchair)?: A Little Help needed standing up from a chair using your arms (e.g., wheelchair or bedside chair)?: A Little Help needed to walk in hospital room?: A Little Help needed climbing 3-5 steps with a railing? : A Lot 6 Click Score: 17    End of Session Equipment Utilized During Treatment: Gait belt Activity Tolerance: Patient limited by fatigue Patient left: in bed;with  nursing/sitter in room;with call bell/phone within reach Nurse Communication: Mobility status PT Visit Diagnosis: Unsteadiness on feet (R26.81);History of falling (Z91.81);Pain;Other abnormalities of gait and mobility (R26.89) Pain - Right/Left: Right Pain - part of body: (lower quadrant)    Time: 6812-7517 PT Time Calculation (min) (ACUTE ONLY): 30 min   Charges:   PT Evaluation $PT Eval Low Complexity: 1 Low PT Treatments $Gait Training: 8-22 mins        Floria Raveling. Hartnett-Rands, MS, PT Per [place] [phone] 01/13/2019, 10:18 AM

## 2019-01-13 NOTE — Progress Notes (Signed)
Pt's wife has called to request to speak with Palliative Care today. Writer phoned Palliative and Dr. Rowe Pavy stated she would have Dr. Hilma Favors call pt's wife tomorrow when Hilma Favors is working.

## 2019-01-14 ENCOUNTER — Inpatient Hospital Stay (HOSPITAL_COMMUNITY): Payer: BC Managed Care – PPO

## 2019-01-14 ENCOUNTER — Ambulatory Visit: Payer: BC Managed Care – PPO

## 2019-01-14 ENCOUNTER — Encounter (HOSPITAL_COMMUNITY): Payer: Self-pay | Admitting: Gastroenterology

## 2019-01-14 DIAGNOSIS — R7881 Bacteremia: Secondary | ICD-10-CM

## 2019-01-14 LAB — CULTURE, BLOOD (SINGLE): Culture: NO GROWTH

## 2019-01-14 LAB — ECHOCARDIOGRAM COMPLETE
Height: 77 in
Weight: 3125.24 oz

## 2019-01-14 LAB — BODY FLUID CULTURE: Culture: NO GROWTH

## 2019-01-14 NOTE — Progress Notes (Deleted)
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[place])   08/31/2017 - 11/22/2017 Chemotherapy   The patient had palonosetron (ALOXI) injection 0.25 mg, 0.25 mg, Intravenous,  Once, 6 of 6 cycles Administration: 0.25 mg (08/31/2017), 0.25 mg (09/14/2017), 0.25 mg (09/28/2017), 0.25 mg (10/12/2017), 0.25 mg (10/26/2017), 0.25 mg (11/09/2017) pegfilgrastim-cbqv (UDENYCA) injection 6 mg, 6 mg, Subcutaneous, Once, 6 of 6 cycles Administration: 6 mg (09/02/2017), 6 mg (09/16/2017), 6 mg (09/30/2017), 6 mg (10/14/2017), 6 mg (10/28/2017), 6 mg (11/11/2017) irinotecan (CAMPTOSAR) 360 mg in sodium chloride 0.9 % 500 mL chemo infusion, 150 mg/m2 = 360 mg (100 % of original dose 150 mg/m2), Intravenous,  Once, 6 of 6 cycles Dose modification: 150 mg/m2 (original dose 150 mg/m2, Cycle 1, Reason: Provider Judgment) Administration: 360 mg (08/31/2017), 360 mg (09/14/2017), 360 mg (09/28/2017), 340 mg (10/12/2017), 340 mg (10/26/2017), 340 mg (11/09/2017) leucovorin 952 mg in sodium chloride 0.9 % 250 mL infusion, 400 mg/m2 = 952 mg, Intravenous,  Once, 6 of 6 cycles Administration: 952 mg (08/31/2017), 952 mg (09/14/2017), 952 mg (09/28/2017), 888 mg (10/12/2017), 888 mg (10/26/2017), 888 mg (11/09/2017) oxaliplatin (ELOXATIN) 200 mg in dextrose 5 % 500 mL chemo infusion, 85 mg/m2 = 200 mg, Intravenous,  Once, 6 of 6 cycles Dose modification: 65 mg/m2 (original dose 85 mg/m2, Cycle 4, Reason: Provider Judgment) Administration: 200 mg (08/31/2017), 200 mg (09/14/2017), 200 mg (09/28/2017), 145 mg (10/12/2017), 150 mg (10/26/2017), 150 mg (11/09/2017) fosaprepitant (EMEND) 150 mg, dexamethasone (DECADRON) 12 mg in sodium chloride  0.9 % 145 mL IVPB, , Intravenous,  Once, 6 of 6 cycles Administration:  (08/31/2017),  (09/14/2017),  (09/28/2017),  (10/12/2017),  (10/26/2017),  (11/09/2017) fluorouracil (ADRUCIL) 5,700 mg in sodium chloride 0.9 % 136 mL chemo infusion, 2,400 mg/m2 = 5,700 mg, Intravenous, 1 Day/Dose, 6 of 6 cycles Administration: 5,700 mg (08/31/2017), 5,700 mg (09/14/2017), 5,700 mg (09/28/2017), 5,350 mg (10/12/2017), 5,350 mg (10/26/2017), 5,350 mg (11/09/2017)  for chemotherapy treatment.    12/06/2017 - 02/28/2018 Chemotherapy   The patient had gemcitabine (GEMZAR) 1,786 mg in sodium chloride 0.9 % 250 mL chemo infusion, 800 mg/m2 = 1,786 mg (80 % of original dose 1,000 mg/m2), Intravenous,  Once, 4 of 4 cycles Dose modification: 800 mg/m2 (original dose 1,000 mg/m2, Cycle 1, Reason: Provider Judgment), 800 mg/m2 (original dose 1,000 mg/m2, Cycle 1, Reason: Provider Judgment, Comment: call from MD to decrease dose due to counts) Administration: 1,786 mg (12/20/2017), 1,786 mg (12/06/2017), 1,786 mg (01/03/2018), 1,786 mg (01/17/2018), 1,786 mg (01/31/2018), 1,786 mg (02/14/2018), 1,786 mg (02/28/2018) PACLitaxel-protein bound (ABRAXANE) chemo infusion 225 mg, 100 mg/m2 = 225 mg (80 % of original dose 125 mg/m2), Intravenous, Once, 3 of 3 cycles Dose modification: 100 mg/m2 (original dose 125 mg/m2, Cycle 1, Reason: Provider Judgment), 100 mg/m2 (original dose 125 mg/m2, Cycle 1, Reason: Provider Judgment, Comment: call from MD to decrease dose based on counts) Administration: 225 mg (12/20/2017), 225 mg (12/06/2017), 225 mg (01/03/2018), 225 mg (01/17/2018)  for chemotherapy treatment.    07/04/2018 Cancer Staging   Staging form: Exocrine Pancreas, AJCC 8th Edition - Pathologic: Stage IV (cM1) - Signed by Ladell Pier, MD on 07/04/2018   08/21/2018 -  Chemotherapy  The patient had palonosetron (ALOXI) injection 0.25 mg, 0.25 mg, Intravenous,  Once, 7 of 8 cycles Administration: 0.25 mg (08/21/2018), 0.25 mg (09/03/2018), 0.25 mg  (09/18/2018), 0.25 mg (10/01/2018), 0.25 mg (10/16/2018), 0.25 mg (11/14/2018), 0.25 mg (12/10/2018) pegfilgrastim-cbqv (UDENYCA) injection 6 mg, 6 mg, Subcutaneous, Once, 7 of 8 cycles Administration: 6 mg (08/23/2018), 6 mg (09/05/2018), 6 mg (09/20/2018), 6 mg (10/03/2018), 6 mg (10/18/2018), 6 mg (11/16/2018) irinotecan (CAMPTOSAR) 360 mg in dextrose 5 % 500 mL chemo infusion, 150 mg/m2 = 360 mg (100 % of original dose 150 mg/m2), Intravenous,  Once, 7 of 8 cycles Dose modification: 150 mg/m2 (original dose 150 mg/m2, Cycle 1, Reason: Provider Judgment), 112.5 mg/m2 (original dose 150 mg/m2, Cycle 1, Reason: Change in LFTs), 90 mg/m2 (original dose 150 mg/m2, Cycle 6, Reason: Provider Judgment), 90 mg/m2 (original dose 90 mg/m2, Cycle 7) Administration: 260 mg (08/21/2018), 260 mg (09/03/2018), 260 mg (09/18/2018), 260 mg (10/01/2018), 200 mg (11/14/2018), 200 mg (10/16/2018), 200 mg (12/10/2018) leucovorin 952 mg in dextrose 5 % 250 mL infusion, 400 mg/m2 = 952 mg, Intravenous,  Once, 7 of 8 cycles Dose modification: 300 mg/m2 (original dose 400 mg/m2, Cycle 1, Reason: Provider Judgment), 300 mg/m2 (original dose 300 mg/m2, Cycle 7) Administration: 696 mg (08/21/2018), 696 mg (09/03/2018), 696 mg (09/18/2018), 676 mg (10/01/2018), 676 mg (11/14/2018), 676 mg (10/16/2018), 676 mg (12/10/2018) oxaliplatin (ELOXATIN) 155 mg in dextrose 5 % 500 mL chemo infusion, 65 mg/m2 = 155 mg (100 % of original dose 65 mg/m2), Intravenous,  Once, 7 of 8 cycles Dose modification: 65 mg/m2 (original dose 65 mg/m2, Cycle 1, Reason: Provider Judgment) Administration: 150 mg (08/21/2018), 150 mg (09/03/2018), 150 mg (09/18/2018), 150 mg (10/01/2018), 150 mg (10/16/2018), 150 mg (11/14/2018), 150 mg (12/10/2018) fosaprepitant (EMEND) 150 mg, dexamethasone (DECADRON) 12 mg in sodium chloride 0.9 % 145 mL IVPB, , Intravenous,  Once, 7 of 8 cycles Administration:  (08/21/2018),  (09/03/2018),  (09/18/2018),  (10/01/2018),  (10/16/2018),  (11/14/2018),  (12/10/2018)  fluorouracil (ADRUCIL) 5,700 mg in sodium chloride 0.9 % 136 mL chemo infusion, 2,400 mg/m2 = 5,700 mg, Intravenous, 1 Day/Dose, 7 of 8 cycles Dose modification: 1,800 mg/m2 (original dose 2,400 mg/m2, Cycle 1, Reason: Change in LFTs) Administration: 4,200 mg (08/21/2018), 4,200 mg (09/03/2018), 4,200 mg (09/18/2018), 4,050 mg (10/01/2018), 4,050 mg (10/16/2018), 4,050 mg (11/14/2018), 4,050 mg (12/10/2018)  for chemotherapy treatment.      CURRENT THERAPY:   INTERVAL HISTORY:   REVIEW OF SYSTEMS:   Constitutional: Denies fevers, chills or abnormal weight loss Eyes: Denies blurriness of vision Ears, nose, mouth, throat, and face: Denies mucositis or sore throat Respiratory: Denies cough, dyspnea or wheezes Cardiovascular: Denies palpitation, chest discomfort or lower extremity swelling Gastrointestinal:  Denies nausea, heartburn or change in bowel habits Skin: Denies abnormal skin rashes Lymphatics: Denies new lymphadenopathy or easy bruising Neurological:Denies numbness, tingling or new weaknesses Behavioral/Psych: Mood is stable, no new changes  All other systems were reviewed with the patient and are negative.  MEDICAL HISTORY:  Past Medical History:  Diagnosis Date  . Abdominal pain    due to bloating  . Bloating   . Cancer ([place])    skin cancer  . Essential hypertension   . Fatigue   . History of kidney stones    passed  . History of weight loss   . HOH (hard of hearing)    no hearing aids  . Neuralgia   . Pancreatic cancer ([place])   . Stress    loss of father in  Dec 2018.    SURGICAL HISTORY: Past Surgical History:  Procedure Laterality Date  . APPENDECTOMY    . BILIARY STENT PLACEMENT N/A 06/25/2018   Procedure: BILIARY STENT PLACEMENT;  Surgeon: Rush Landmark Telford Nab., MD;  Location: [facility];  Service: Gastroenterology;  Laterality: N/A;  . BILIARY STENT PLACEMENT  07/20/2018   Procedure: BILIARY STENT PLACEMENT;  Surgeon: Milus Banister, MD;  Location: [facility]  ENDOSCOPY;  Service: Gastroenterology;;  . BILIARY STENT PLACEMENT N/A 08/16/2018   Procedure: BILIARY STENT PLACEMENT;  Surgeon: Milus Banister, MD;  Location: [facility] ENDOSCOPY;  Service: Endoscopy;  Laterality: N/A;  . BILIARY STENT PLACEMENT N/A 01/10/2019   Procedure: BILIARY STENT PLACEMENT;  Surgeon: Milus Banister, MD;  Location: [facility] ENDOSCOPY;  Service: Endoscopy;  Laterality: N/A;  cbd stent sweep  . ENDOSCOPIC RETROGRADE CHOLANGIOPANCREATOGRAPHY (ERCP) WITH PROPOFOL N/A 08/10/2017   Procedure: ENDOSCOPIC RETROGRADE CHOLANGIOPANCREATOGRAPHY (ERCP) WITH PROPOFOL;  Surgeon: Milus Banister, MD;  Location: [facility] ENDOSCOPY;  Service: Endoscopy;  Laterality: N/A;  . ENDOSCOPIC RETROGRADE CHOLANGIOPANCREATOGRAPHY (ERCP) WITH PROPOFOL N/A 07/20/2018   Procedure: ENDOSCOPIC RETROGRADE CHOLANGIOPANCREATOGRAPHY (ERCP) WITH PROPOFOL;  Surgeon: Milus Banister, MD;  Location: [facility] ENDOSCOPY;  Service: Gastroenterology;  Laterality: N/A;  Balloon Sweeps  . ENDOSCOPIC RETROGRADE CHOLANGIOPANCREATOGRAPHY (ERCP) WITH PROPOFOL N/A 08/16/2018   Procedure: ENDOSCOPIC RETROGRADE CHOLANGIOPANCREATOGRAPHY (ERCP) WITH PROPOFOL;  Surgeon: Milus Banister, MD;  Location: [facility] ENDOSCOPY;  Service: Endoscopy;  Laterality: N/A;  . ENDOSCOPIC RETROGRADE CHOLANGIOPANCREATOGRAPHY (ERCP) WITH PROPOFOL N/A 12/06/2018   Procedure: ENDOSCOPIC RETROGRADE CHOLANGIOPANCREATOGRAPHY (ERCP) WITH PROPOFOL;  Surgeon: Milus Banister, MD;  Location: [facility] ENDOSCOPY;  Service: Endoscopy;  Laterality: N/A;  cbd stent sweep  . ERCP N/A 06/25/2018   Procedure: ENDOSCOPIC RETROGRADE CHOLANGIOPANCREATOGRAPHY (ERCP);  Surgeon: Irving Copas., MD;  Location: [facility];  Service: Gastroenterology;  Laterality: N/A;  . ERCP N/A 01/10/2019   Procedure: ENDOSCOPIC RETROGRADE CHOLANGIOPANCREATOGRAPHY (ERCP);  Surgeon: Milus Banister, MD;  Location: [facility];  Service: Endoscopy;  Laterality: N/A;  . EUS N/A 08/10/2017   Procedure: UPPER ENDOSCOPIC  ULTRASOUND (EUS) RADIAL;  Surgeon: Milus Banister, MD;  Location: [facility] ENDOSCOPY;  Service: Endoscopy;  Laterality: N/A;  . EYE SURGERY     lasik/left eye  . IR FLUORO GUIDE PORT INSERTION RIGHT  08/30/2017  . IR REMOVAL TUN ACCESS W/ PORT W/O FL MOD SED  12/27/2018  . IR US GUIDE VASC ACCESS RIGHT  08/30/2017  . REMOVAL OF STONES  06/25/2018   Procedure: REMOVAL OF STONES;  Surgeon: Rush Landmark Telford Nab., MD;  Location: [facility] ENDOSCOPY;  Service: Gastroenterology;;  . SKIN CANCER EXCISION    . STENT REMOVAL  07/20/2018   Procedure: STENT REMOVAL;  Surgeon: Milus Banister, MD;  Location: [facility] END[facility]COPY;  Service: Gastroenterology;;  . Lavell Islam REMOVAL  08/16/2018   Procedure: STENT REMOVAL;  Surgeon: Milus Banister, MD;  Location: [facility] ENDOSCOPY;  Service: Endoscopy;;  . TEE WITHOUT CARDIOVERSION N/A 11/08/2018   Procedure: TRANSESOPHAGEAL ECHOCARDIOGRAM (TEE);  Surgeon: Acie Fredrickson Wonda Cheng, MD;  Location: [facility] ENDOSCOPY;  Service: Cardiovascular;  Laterality: N/A;    I have reviewed the social history and family history with the patient and they are unchanged from previous note.  ALLERGIES:  has No Known Allergies.  MEDICATIONS:  No current facility-administered medications for this visit.    No current outpatient medications on file.   Facility-Administered Medications Ordered in Other Visits  Medication Dose Route Frequency Provider Last Rate Last Dose  . 0.9 %  sodium chloride infusion  Intravenous PRN Marylyn Ishihara, Tyrone A, DO 10 mL/hr at 01/13/19 0400    . amLODipine (NORVASC) tablet 10 mg  10 mg Oral QHS Milus Banister, MD   10 mg at 01/13/19 2112  . anidulafungin (ERAXIS) 100 mg in sodium chloride 0.9 % 100 mL IVPB  100 mg Intravenous Q24H Milus Banister, MD 78 mL/hr at 01/13/19 2114 100 mg at 01/13/19 2114  . diphenhydrAMINE (BENADRYL) capsule 25 mg  25 mg Oral Q6H PRN Hollace Hayward K, NP   25 mg at 01/13/19 2112  . doxazosin (CARDURA) tablet 1 mg  1 mg Oral QHS Milus Banister, MD   1 mg at  01/13/19 2252  . gabapentin (NEURONTIN) capsule 200 mg  200 mg Oral BID Milus Banister, MD   200 mg at 01/14/19 0939  . heparin injection 5,000 Units  5,000 Units Subcutaneous Q8H Milus Banister, MD   5,000 Units at 01/14/19 1524  . hydrocortisone (ANUSOL-HC) 2.5 % rectal cream 1 application  1 application Topical QID PRN Milus Banister, MD      . indomethacin (INDOCIN) 50 MG suppository 100 mg  100 mg Rectal Once Milus Banister, MD      . irbesartan Levy Sjogren) tablet 300 mg  300 mg Oral Daily Milus Banister, MD   300 mg at 01/14/19 8469  . lactated ringers infusion   Intravenous Continuous Milus Banister, MD 10 mL/hr at 01/10/19 1147    . loperamide (IMODIUM) capsule 2-4 mg  2-4 mg Oral TID PRN Milus Banister, MD      . magnesium oxide (MAG-OX) tablet 400 mg  400 mg Oral BID Milus Banister, MD   400 mg at 01/14/19 6295  . meropenem (MERREM) 1 g in sodium chloride 0.9 % 100 mL IVPB  1 g Intravenous Q8H Powers, Evern Core, MD 200 mL/hr at 01/14/19 1524 1 g at 01/14/19 1524  . oxyCODONE-acetaminophen (PERCOCET/ROXICET) 5-325 MG per tablet 1 tablet  1 tablet Oral Q4H PRN Milus Banister, MD   1 tablet at 01/11/19 2202   And  . oxyCODONE (Oxy IR/ROXICODONE) immediate release tablet 5 mg  5 mg Oral Q4H PRN Milus Banister, MD   5 mg at 01/14/19 2841  . potassium chloride SA (K-DUR) CR tablet 20 mEq  20 mEq Oral BID Milus Banister, MD   20 mEq at 01/14/19 0939  . prochlorperazine (COMPAZINE) tablet 10 mg  10 mg Oral Q6H PRN Milus Banister, MD   10 mg at 01/11/19 1250  . sodium chloride flush (NS) 0.9 % injection 10-40 mL  10-40 mL Intracatheter Q12H Milus Banister, MD   10 mL at 01/12/19 0959  . sodium chloride flush (NS) 0.9 % injection 10-40 mL  10-40 mL Intracatheter PRN Milus Banister, MD   10 mL at 01/11/19 2152    PHYSICAL EXAMINATION: ECOG PERFORMANCE STATUS: {CHL ONC ECOG [phone]}  There were no vitals filed for this visit. There were no vitals filed for this  visit.  GENERAL:alert, no distress and comfortable SKIN: skin color, texture, turgor are normal, no rashes or significant lesions EYES: normal, Conjunctiva are pink and non-injected, sclera clear OROPHARYNX:no exudate, no erythema and lips, buccal mucosa, and tongue normal  NECK: supple, thyroid normal size, non-tender, without nodularity LYMPH:  no palpable lymphadenopathy in the cervical, axillary or inguinal LUNGS: clear to auscultation and percussion with normal breathing effort HEART: regular rate & rhythm and no murmurs and no lower extremity  edema ABDOMEN:abdomen soft, non-tender and normal bowel sounds Musculoskeletal:no cyanosis of digits and no clubbing  NEURO: alert & oriented x 3 with fluent speech, no focal motor/sensory deficits  LABORATORY DATA:  I have reviewed the data as listed CBC Latest Ref Rng & Units 01/13/2019 01/12/2019 01/11/2019  WBC 4.0 - 10.5 K/uL 2.0(L) 2.6(L) 2.5(L)  Hemoglobin 13.0 - 17.0 g/dL 8.4(L) 8.1(L) 8.1(L)  Hematocrit 39.0 - 52.0 % 27.2(L) 25.4(L) 26.2(L)  Platelets 150 - 400 K/uL 90(L) 69(L) 61(L)     CMP Latest Ref Rng & Units 01/13/2019 01/12/2019 01/11/2019  Glucose 70 - 99 mg/dL 103(H) 117(H) 115(H)  BUN 8 - 23 mg/dL 10 11 10   Creatinine 0.61 - 1.24 mg/dL 0.47(L) 0.49(L) 0.51(L)  Sodium 135 - 145 mmol/L 131(L) 132(L) 136  Potassium 3.5 - 5.1 mmol/L 3.9 3.9 4.1  Chloride 98 - 111 mmol/L 96(L) 96(L) 101  CO2 22 - 32 mmol/L 28 28 28   Calcium 8.9 - 10.3 mg/dL 7.7(L) 7.6(L) 7.8(L)  Total Protein 6.5 - 8.1 g/dL - 4.4(L) 4.4(L)  Total Bilirubin 0.3 - 1.2 mg/dL - 5.0(H) 7.1(H)  Alkaline Phos 38 - 126 U/L - 711(H) 749(H)  AST 15 - 41 U/L - 53(H) 98(H)  ALT 0 - 44 U/L - 71(H) 100(H)      RADIOGRAPHIC STUDIES: I have personally reviewed the radiological images as listed and agreed with the findings in the report. No results found.   ASSESSMENT & PLAN:  No problem-specific Assessment & Plan notes found for this encounter.   No orders of the  defined types were placed in this encounter.  All questions were answered. The patient knows to call the clinic with any problems, questions or concerns. No barriers to learning was detected. I spent {CHL ONC TIME VISIT - RVUYE:[id number]} counseling the patient face to face. The total time spent in the appointment was {CHL ONC TIME VISIT - OHFGB:[id number]} and more than 50% was on counseling and review of test results     Alla Feeling, NP 01/14/19

## 2019-01-14 NOTE — Progress Notes (Addendum)
HEMATOLOGY-ONCOLOGY PROGRESS NOTE  SUBJECTIVE:  Hoping to go home later today. PICC to be placed if [facility] remain negative. No recurrent fever or chills. Abdomen still feels full.   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[place])   08/31/2017 - 11/22/2017 Chemotherapy   The patient had palonosetron (ALOXI) injection 0.25 mg, 0.25 mg, Intravenous,  Once, 6 of 6 cycles Administration: 0.25 mg (08/31/2017), 0.25 mg (09/14/2017), 0.25 mg (09/28/2017), 0.25 mg (10/12/2017), 0.25 mg (10/26/2017), 0.25 mg (11/09/2017) pegfilgrastim-cbqv (UDENYCA) injection 6 mg, 6 mg, Subcutaneous, Once, 6 of 6 cycles Administration: 6 mg (09/02/2017), 6 mg (09/16/2017), 6 mg (09/30/2017), 6 mg (10/14/2017), 6 mg (10/28/2017), 6 mg (11/11/2017) irinotecan (CAMPTOSAR) 360 mg in sodium chloride 0.9 % 500 mL chemo infusion, 150 mg/m2 = 360 mg (100 % of original dose 150 mg/m2), Intravenous,  Once, 6 of 6 cycles Dose modification: 150 mg/m2 (original dose 150 mg/m2, Cycle 1, Reason: Provider Judgment) Administration: 360 mg (08/31/2017), 360 mg (09/14/2017), 360 mg (09/28/2017), 340 mg (10/12/2017), 340 mg (10/26/2017), 340 mg (11/09/2017) leucovorin 952 mg in sodium chloride 0.9 % 250 mL infusion, 400 mg/m2 = 952 mg, Intravenous,  Once, 6 of 6 cycles Administration: 952 mg (08/31/2017), 952 mg (09/14/2017), 952 mg (09/28/2017), 888 mg (10/12/2017), 888 mg (10/26/2017), 888 mg (11/09/2017) oxaliplatin (ELOXATIN) 200 mg in dextrose 5 % 500 mL chemo infusion, 85 mg/m2 = 200 mg, Intravenous,  Once, 6 of 6 cycles Dose modification: 65 mg/m2 (original dose 85 mg/m2, Cycle 4, Reason: Provider Judgment) Administration: 200 mg (08/31/2017), 200 mg (09/14/2017), 200 mg (09/28/2017), 145 mg (10/12/2017), 150 mg (10/26/2017), 150 mg (11/09/2017) fosaprepitant (EMEND) 150 mg, dexamethasone (DECADRON) 12 mg in sodium chloride 0.9 % 145 mL IVPB, , Intravenous,  Once, 6 of 6  cycles Administration:  (08/31/2017),  (09/14/2017),  (09/28/2017),  (10/12/2017),  (10/26/2017),  (11/09/2017) fluorouracil (ADRUCIL) 5,700 mg in sodium chloride 0.9 % 136 mL chemo infusion, 2,400 mg/m2 = 5,700 mg, Intravenous, 1 Day/Dose, 6 of 6 cycles Administration: 5,700 mg (08/31/2017), 5,700 mg (09/14/2017), 5,700 mg (09/28/2017), 5,350 mg (10/12/2017), 5,350 mg (10/26/2017), 5,350 mg (11/09/2017)  for chemotherapy treatment.    12/06/2017 - 02/28/2018 Chemotherapy   The patient had gemcitabine (GEMZAR) 1,786 mg in sodium chloride 0.9 % 250 mL chemo infusion, 800 mg/m2 = 1,786 mg (80 % of original dose 1,000 mg/m2), Intravenous,  Once, 4 of 4 cycles Dose modification: 800 mg/m2 (original dose 1,000 mg/m2, Cycle 1, Reason: Provider Judgment), 800 mg/m2 (original dose 1,000 mg/m2, Cycle 1, Reason: Provider Judgment, Comment: call from MD to decrease dose due to counts) Administration: 1,786 mg (12/20/2017), 1,786 mg (12/06/2017), 1,786 mg (01/03/2018), 1,786 mg (01/17/2018), 1,786 mg (01/31/2018), 1,786 mg (02/14/2018), 1,786 mg (02/28/2018) PACLitaxel-protein bound (ABRAXANE) chemo infusion 225 mg, 100 mg/m2 = 225 mg (80 % of original dose 125 mg/m2), Intravenous, Once, 3 of 3 cycles Dose modification: 100 mg/m2 (original dose 125 mg/m2, Cycle 1, Reason: Provider Judgment), 100 mg/m2 (original dose 125 mg/m2, Cycle 1, Reason: Provider Judgment, Comment: call from MD to decrease dose based on counts) Administration: 225 mg (12/20/2017), 225 mg (12/06/2017), 225 mg (01/03/2018), 225 mg (01/17/2018)  for chemotherapy treatment.    07/04/2018 Cancer Staging   Staging form: Exocrine Pancreas, AJCC 8th Edition - Pathologic: Stage IV (cM1) - Signed by Ladell Pier, MD on 07/04/2018   08/21/2018 -  Chemotherapy   The patient had palonosetron (ALOXI) injection 0.25 mg, 0.25  mg, Intravenous,  Once, 7 of 8 cycles Administration: 0.25 mg (08/21/2018), 0.25 mg (09/03/2018), 0.25 mg (09/18/2018), 0.25 mg (10/01/2018), 0.25 mg (10/16/2018),  0.25 mg (11/14/2018), 0.25 mg (12/10/2018) pegfilgrastim-cbqv (UDENYCA) injection 6 mg, 6 mg, Subcutaneous, Once, 7 of 8 cycles Administration: 6 mg (08/23/2018), 6 mg (09/05/2018), 6 mg (09/20/2018), 6 mg (10/03/2018), 6 mg (10/18/2018), 6 mg (11/16/2018) irinotecan (CAMPTOSAR) 360 mg in dextrose 5 % 500 mL chemo infusion, 150 mg/m2 = 360 mg (100 % of original dose 150 mg/m2), Intravenous,  Once, 7 of 8 cycles Dose modification: 150 mg/m2 (original dose 150 mg/m2, Cycle 1, Reason: Provider Judgment), 112.5 mg/m2 (original dose 150 mg/m2, Cycle 1, Reason: Change in LFTs), 90 mg/m2 (original dose 150 mg/m2, Cycle 6, Reason: Provider Judgment), 90 mg/m2 (original dose 90 mg/m2, Cycle 7) Administration: 260 mg (08/21/2018), 260 mg (09/03/2018), 260 mg (09/18/2018), 260 mg (10/01/2018), 200 mg (11/14/2018), 200 mg (10/16/2018), 200 mg (12/10/2018) leucovorin 952 mg in dextrose 5 % 250 mL infusion, 400 mg/m2 = 952 mg, Intravenous,  Once, 7 of 8 cycles Dose modification: 300 mg/m2 (original dose 400 mg/m2, Cycle 1, Reason: Provider Judgment), 300 mg/m2 (original dose 300 mg/m2, Cycle 7) Administration: 696 mg (08/21/2018), 696 mg (09/03/2018), 696 mg (09/18/2018), 676 mg (10/01/2018), 676 mg (11/14/2018), 676 mg (10/16/2018), 676 mg (12/10/2018) oxaliplatin (ELOXATIN) 155 mg in dextrose 5 % 500 mL chemo infusion, 65 mg/m2 = 155 mg (100 % of original dose 65 mg/m2), Intravenous,  Once, 7 of 8 cycles Dose modification: 65 mg/m2 (original dose 65 mg/m2, Cycle 1, Reason: Provider Judgment) Administration: 150 mg (08/21/2018), 150 mg (09/03/2018), 150 mg (09/18/2018), 150 mg (10/01/2018), 150 mg (10/16/2018), 150 mg (11/14/2018), 150 mg (12/10/2018) fosaprepitant (EMEND) 150 mg, dexamethasone (DECADRON) 12 mg in sodium chloride 0.9 % 145 mL IVPB, , Intravenous,  Once, 7 of 8 cycles Administration:  (08/21/2018),  (09/03/2018),  (09/18/2018),  (10/01/2018),  (10/16/2018),  (11/14/2018),  (12/10/2018) fluorouracil (ADRUCIL) 5,700 mg in sodium chloride 0.9 % 136  mL chemo infusion, 2,400 mg/m2 = 5,700 mg, Intravenous, 1 Day/Dose, 7 of 8 cycles Dose modification: 1,800 mg/m2 (original dose 2,400 mg/m2, Cycle 1, Reason: Change in LFTs) Administration: 4,200 mg (08/21/2018), 4,200 mg (09/03/2018), 4,200 mg (09/18/2018), 4,050 mg (10/01/2018), 4,050 mg (10/16/2018), 4,050 mg (11/14/2018), 4,050 mg (12/10/2018)  for chemotherapy treatment.      REVIEW OF SYSTEMS:   Constitutional: No recurrent fevers or chills. Respiratory: Denies cough, dyspnea or wheezes Cardiovascular: Denies palpitation, chest discomfort Gastrointestinal: Reports abdominal bloating.Denies nausea, vomiting, constipation, diarrhea. Skin: Denies abnormal skin rashes Lymphatics: Denies new lymphadenopathy or easy bruising Neurological:Denies numbness, tingling or new weaknesses Behavioral/Psych: Mood is stable, no new changes  Extremities: Reports lower extremity edema. All other systems were reviewed with the patient and are negative.  I have reviewed the past medical history, past surgical history, social history and family history with the patient and they are unchanged from previous note.   PHYSICAL EXAMINATION:  Vitals:   01/13/19 2003 01/14/19 0550  BP: 130/85 136/83  Pulse: 67 69  Resp: 18 18  Temp: 98.4 F (36.9 C) 98.3 F (36.8 C)  SpO2: 95% 95%   Filed Weights   01/10/19 1140  Weight: 195 lb 5.2 oz (88.6 kg)    Intake/Output from previous day: 07/12 0701 - 07/13 0700 In: 900 [P.O.:780; I.V.:120] Out: 650 [Urine:650]  GENERAL:alert, no distress EYES: scleral icterus noted OROPHARYNX: no thrush or mucositis NECK: supple, thyroid normal size, non-tender, without nodularity LYMPH:  no palpable lymphadenopathy in the  cervical, axillary or inguinal LUNGS: clear to auscultation and percussion with normal breathing effort HEART: regular rate & rhythm and no murmurs and trace lower extremity edema ABDOMEN: Positive bowel sounds. Moderate ascites noted.    Musculoskeletal:no cyanosis of digits and no clubbing  NEURO: alert & oriented x 3 with fluent speech, no focal motor/sensory deficits  LABORATORY DATA:  I have reviewed the data as listed CMP Latest Ref Rng & Units 01/13/2019 01/12/2019 01/11/2019  Glucose 70 - 99 mg/dL 103(H) 117(H) 115(H)  BUN 8 - 23 mg/dL _0 Creatinine 0.61 - 1.24 mg/dL 0.47(L) 0.49(L) 0.51(L)  Sodium 135 - 145 mmol/L 131(L) 132(L) 136  Potassium 3.5 - 5.1 mmol/L 3.9 3.9 4.1  Chloride 98 - 111 mmol/L 96(L) 96(L) 101  CO2 22 - 32 mmol/L _1 Calcium 8.9 - 10.3 mg/dL 7.7(L) 7.6(L) 7.8(L)  Total Protein 6.5 - 8.1 g/dL - 4.4(L) 4.4(L)  Total Bilirubin 0.3 - 1.2 mg/dL - 5.0(H) 7.1(H)  Alkaline Phos 38 - 126 U/L - 711(H) 749(H)  AST 15 - 41 U/L - 53(H) 98(H)  ALT 0 - 44 U/L - 71(H) 100(H)    Lab Results  Component Value Date   WBC 2.0 (L) 01/13/2019   HGB 8.4 (L) 01/13/2019   HCT 27.2 (L) 01/13/2019   MCV 89.2 01/13/2019   PLT 90 (L) 01/13/2019   NEUTROABS 1.5 (L) 01/13/2019    Dg Chest 2 View  Result Date: 12/28/2018 CLINICAL DATA:  Sepsis EXAM: CHEST - 2 VIEW COMPARISON:  December 24, 2018 FINDINGS: The right-sided Port-A-Cath has been removed. There is a new right-sided PICC line with tip terminating over the distal SVC. The cardiac size is stable. There is no pneumothorax. No large pleural effusion. The lung volumes are slightly low. IMPRESSION: No active cardiopulmonary disease. Electronically Signed   By: Constance Holster M.D.   On: 12/28/2018 18:53   Dg Ribs Unilateral W/chest Right  Result Date: 01/09/2019 CLINICAL DATA:  Fever, increasing ascites, fall several days prior, pain on right side chest. EXAM: RIGHT RIBS AND CHEST - 3+ VIEW COMPARISON:  Radiograph 01/06/2019, 12/28/2018 FINDINGS: Radiopaque marker placed at the level of the lateral right tenth rib, no subjacent fracture. No visible displaced rib fractures or other acute osseous abnormality. Degenerative changes in the spine and shoulders.  Right upper extremity PICC tip terminates at the superior cavoatrial junction. Surgical clips and TIPS catheter noted in the right upper quadrant. No acute soft tissue abnormality. Trace right pleural effusion and bandlike areas of subsegmental atelectasis the right lung base. Additional retrocardiac opacity is similar to prior exam. Lungs otherwise clear. Cardiomediastinal contours are unremarkable. IMPRESSION: No visible displaced rib fracture or other acute osseous abnormality. Right basilar and retrocardiac opacities could reflect atelectasis though developing consolidation could have a similar appearance given setting of fever. Trace right effusion. Electronically Signed   By: MD Lovena Le   On: 01/09/2019 16:15   Ct Abdomen Pelvis W Contrast  Result Date: 01/09/2019 CLINICAL DATA:  Fall, low back pain, pancreatic cancer with biliary stent placement EXAM: CT ABDOMEN AND PELVIS WITH CONTRAST TECHNIQUE: Multidetector CT imaging of the abdomen and pelvis was performed using the standard protocol following bolus administration of intravenous contrast. CONTRAST:  163m OMNIPAQUE IOHEXOL 300 MG/ML  SOLN COMPARISON:  12/24/2018 FINDINGS: Lower chest: Small to moderate bilateral pleural effusions and associated atelectasis or consolidation, new compared to prior examination. Coronary artery calcifications. Hepatobiliary: No solid liver abnormality is seen. Redemonstrated common bile duct  stent with slightly increased biliary ductal dilatation and redemonstrated post stenting pneumobilia. No change in appearance or configuration of stent. Pancreas: Primary pancreatic mass is not clearly appreciated on this examination, with atrophy and ductal dilatation of the pancreatic body and tail. Spleen: Splenomegaly, maximum coronal span 19.3 cm. Adrenals/Urinary Tract: Adrenal glands are unremarkable. Nonobstructive bilateral nephrolithiasis. No hydronephrosis. Bladder is unremarkable. Stomach/Bowel: Stomach is within  normal limits. Appendix appears normal. No evidence of bowel wall thickening, distention, or inflammatory changes. Vascular/Lymphatic: Aortic atherosclerosis. No enlarged abdominal or pelvic lymph nodes. Reproductive: No mass or other significant abnormality. Other: No abdominal wall hernia or abnormality. Moderate to large volume ascites, which is increased compared to prior examination. Anasarca. Musculoskeletal: There are mildly displaced fracture heads of the right tenth and eleventh ribs as well as the right transverse process of the T10 vertebral body. IMPRESSION: 1. There are mildly displaced fracture heads of the right tenth and eleventh ribs as well as the right transverse process of the T10 vertebral body. 2. Redemonstrated common bile duct stent with slightly increased biliary ductal dilatation and redemonstrated post stenting pneumobilia. No change in appearance or configuration of stent. Primary pancreatic mass is not clearly appreciated on this examination, with atrophy and ductal dilatation of the pancreatic body and tail. 3. Pleural effusions, ascites, and anasarca, new and increased compared to prior examination. Electronically Signed   By: Alex  Bibbey M.D.   On: 01/09/2019 16:52   Ct Abdomen Pelvis W Contrast  Result Date: 12/24/2018 CLINICAL DATA:  Abdominal distension, fever, history of pancreatic cancer EXAM: CT ABDOMEN AND PELVIS WITH CONTRAST TECHNIQUE: Multidetector CT imaging of the abdomen and pelvis was performed using the standard protocol following bolus administration of intravenous contrast. CONTRAST:  100mL OMNIPAQUE IOHEXOL 300 MG/ML  SOLN COMPARISON:  MR abdomen pelvis, 12/04/2018, 10/30/2018 FINDINGS: Lower chest: No acute abnormality. Hepatobiliary: Known liver metastases are poorly appreciated by single-phase CT, for example in the dome of the liver (series 2, image 14). No gallstones or gallbladder wall thickening. Common bile duct stent is positioned tip in the descending  portion of the duodenum with post stenting pneumobilia. Pancreas: Discrete pancreatic head mass is not well appreciated, primarily appreciated by mass effect on the confluence of the portal vein (series 2, image 38. There is atrophy of the pancreatic body and tail and dilatation pancreatic duct. Spleen: Splenomegaly. Adrenals/Urinary Tract: Adrenal glands are unremarkable. Nonobstructive bilateral nephrolithiasis. Bladder is unremarkable. Stomach/Bowel: Stomach is within normal limits. Appendix is not clearly visualized. No evidence of bowel wall thickening, distention, or inflammatory changes. Large burden of stool in the distal colon and rectum. Vascular/Lymphatic: Aortic atherosclerosis. No enlarged abdominal or pelvic lymph nodes. Reproductive: No mass or other significant abnormality. Other: No abdominal wall hernia or abnormality. Moderate volume ascites. Musculoskeletal: No acute or significant osseous findings. IMPRESSION: 1. Redemonstrated pancreatic malignancy with hepatic metastases, status post common bile duct stenting. Primary mass and hepatic metastases are better appreciated by recent multiphasic MRI. 2.  Moderate volume ascites, which is similar to prior MRI. 3.  Splenomegaly. 4.  Large burden of stool in the distal colon and rectum. 5.  Nonobstructive bilateral nephrolithiasis. Electronically Signed   By: Alex  Bibbey M.D.   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 Request is made for diagnostic and therapeutic paracentesis. EXAM: ULTRASOUND GUIDED DIAGNOSTIC AND THERAPEUTIC PARACENTESIS MEDICATIONS: 10 mL 1% lidocaine COMPLICATIONS: None immediate. PROCEDURE: Informed written consent was   obtained from the patient after a discussion of the risks, benefits and alternatives to treatment. A timeout was performed prior to the initiation of the procedure. Initial ultrasound  scanning demonstrates a moderate amount of ascites within the right lower abdominal quadrant. The right lower abdomen was prepped and draped in the usual sterile fashion. 1% lidocaine was used for local anesthesia. Following this, a 19 gauge, 7-cm, Yueh catheter was introduced. An ultrasound image was saved for documentation purposes. The paracentesis was performed. The catheter was removed and a dressing was applied. The patient tolerated the procedure well without immediate post procedural complication. FINDINGS: A total of approximately 3.35 L of clear gold fluid was removed. Samples were sent to the laboratory as requested by the clinical team. IMPRESSION: Successful ultrasound-guided paracentesis yielding 3.35 L of peritoneal fluid. Read by: Alexandra Louk, PA-C Electronically Signed   By: Jaime  Wagner D.O.   On: 01/10/2019 16:46   Us Paracentesis  Result Date: 12/31/2018 INDICATION: Patient with history of metastatic pancreatic cancer, ascites. Request made for diagnostic and therapeutic paracentesis. EXAM: ULTRASOUND GUIDED DIAGNOSTIC AND THERAPEUTIC PARACENTESIS MEDICATIONS: None COMPLICATIONS: None immediate. PROCEDURE: Informed written consent was obtained from the patient after a discussion of the risks, benefits and alternatives to treatment. A timeout was performed prior to the initiation of the procedure. Initial ultrasound scanning demonstrates a small to moderate amount of ascites within the right upper to mid abdominal quadrant. The right upper to mid abdomen was prepped and draped in the usual sterile fashion. 1% lidocaine was used for local anesthesia. Following this, a 19 gauge, 7-cm, Yueh catheter was introduced. An ultrasound image was saved for documentation purposes. The paracentesis was performed. The catheter was removed and a dressing was applied. The patient tolerated the procedure well without immediate post procedural complication. FINDINGS: A total of approximately 2.6 liters of  slightly hazy, light yellow fluid was removed. Samples were sent to the laboratory as requested by the clinical team. IMPRESSION: Successful ultrasound-guided diagnostic and therapeutic paracentesis yielding 2.6 liters of peritoneal fluid. Read by: Kevin Allred, PA-C Electronically Signed   By: John  Watts M.D.   On: 12/31/2018 12:04   Ir Removal Tun Access W/ Port W/o Fl Mod Sed  Result Date: 12/27/2018 CLINICAL DATA:  Bacteremia. Indwelling right IJ port catheter placed 08/30/2017 by Dr. Watts, has been working well without evident complication , removal requested EXAM: EXAM TUNNELED PORT CATHETER REMOVAL TECHNIQUE: The procedure, risks (including but not limited to bleeding, infection, organ damage ), benefits, and alternatives were explained to the patient. Questions regarding the procedure were encouraged and answered. The patient understands and consents to the procedure. Intravenous Fentanyl 100mcg and Versed 4mg were administered as conscious sedation during continuous monitoring of the patient's level of consciousness and physiological / cardiorespiratory status by the radiology RN, with a total moderate sedation time of 37 minutes. Overlying skin prepped with chlorhexidine, draped in usual sterile fashion, infiltrated locally with 1% lidocaine. A small incision was made over the scar from previous placement. The port catheter was dissected free from the underlying soft tissues and removed intact. There is no gross purulence. Hemostasis was achieved. The port pocket was closed with deep interrupted and subcuticular continuous 3-0 Monocryl sutures, then covered with Dermabond. The patient tolerated the procedure well. COMPLICATIONS: COMPLICATIONS None immediate IMPRESSION: 1.  Technically successful tunneled Port catheter removal. Electronically Signed   By: D  Hassell M.D.   On: 12/27/2018 16:14   Dg Chest Portable 1 View  Result Date: 01/06/2019   CLINICAL DATA:  Patient with fever EXAM: PORTABLE  CHEST 1 VIEW COMPARISON:  Chest radiograph 12/28/2018 FINDINGS: Right upper extremity PICC line tip projects over the superior vena cava. Monitoring leads overlie the patient. Stable cardiac and mediastinal contours. Elevation of the right hemidiaphragm. Retrocardiac consolidative opacity. No pleural effusion or pneumothorax. IMPRESSION: Retrocardiac consolidative opacity which may represent atelectasis or pneumonia. Electronically Signed   By: Lovey Newcomer M.D.   On: 01/06/2019 21:02   Dg Chest Port 1 View  Result Date: 12/28/2018 CLINICAL DATA:  Sepsis hypertension EXAM: PORTABLE CHEST 1 VIEW COMPARISON:  12/28/2018, 12/24/2018 FINDINGS: Right upper extremity catheter tip overlies the SVC. No acute consolidation or effusion. Normal cardiomediastinal silhouette. No pneumothorax. IMPRESSION: No active disease. Electronically Signed   By: Donavan Foil M.D.   On: 12/28/2018 23:03   Dg Chest Portable 1 View  Result Date: 12/24/2018 CLINICAL DATA:  Fever and weakness. EXAM: PORTABLE CHEST 1 VIEW COMPARISON:  11/04/2018 FINDINGS: The cardiac silhouette, mediastinal and hilar contours are stable. Mild cardiac enlargement and mild tortuosity of the thoracic aorta. The right IJ power port is stable. The lungs are clear. No pleural effusions. The bony thorax is intact. IMPRESSION: No acute cardiopulmonary findings. Electronically Signed   By: Marijo Sanes M.D.   On: 12/24/2018 13:59   Dg Ercp  Result Date: 01/10/2019 CLINICAL DATA:  ERCP with biliary stent placement. EXAM: ERCP TECHNIQUE: Multiple spot images obtained with the fluoroscopic device and submitted for interpretation post-procedure. FLUOROSCOPY TIME:  3 minutes, 17 seconds (131 mGy) COMPARISON:  CT abdomen pelvis-01/09/2019 FINDINGS: 6 spot intraoperative fluoroscopic images of the right upper abdominal quadrant during ERCP are provided for review. Initial image demonstrates an ERCP probe overlying the right upper abdominal quadrant. Overlapping  metallic internal biliary stents overlie the expected location of the CBD. Surgical clips overlie the expected location of the pancreatic head. Subsequent images demonstrate selective cannulation and opacification of the biliary stents. There is suboptimal opacification the mid and distal aspects of the biliary stent, potentially secondary to subtotal occlusion. Subsequent images demonstrate presumed biliary sweeping and clearing of the biliary stents. There is faint opacification of the intrahepatic biliary tree which appears nondilated. IMPRESSION: ERCP with presumed internal biliary stent sleeping/clearing. These images were submitted for radiologic interpretation only. Please see the procedural report for the amount of contrast and the fluoroscopy time utilized. Electronically Signed   By: Sandi Mariscal M.D.   On: 01/10/2019 14:39   [place] Ascites (abdomen Limited)  Result Date: 12/25/2018 CLINICAL DATA:  63 year old male with abdominal distension, ascites EXAM: LIMITED ABDOMEN ULTRASOUND FOR ASCITES TECHNIQUE: Limited ultrasound survey for ascites was performed in all four abdominal quadrants. COMPARISON:  Prior CT scan of the abdomen and pelvis 12/24/2018 FINDINGS: Sonographic interrogation was performed of the 4 quadrants of the abdomen. There is trace perihepatic fluid in the right upper quadrant with multiple small loops of bowel. Overall, fluid volume is low. No safe window for paracentesis. IMPRESSION: Small volume ascites, insufficient for paracentesis. Electronically Signed   By: Jacqulynn Cadet M.D.   On: 12/25/2018 12:13   Ir Picc Placement Right >5 Yrs Inc Img Guide  Result Date: 12/27/2018 CLINICAL DATA:  Bacteremia, needs venous access for planned chemotherapy Monday EXAM: PICC PLACEMENT WITH ULTRASOUND AND FLUOROSCOPY FLUOROSCOPY TIME:  6 seconds, 1 mGy TECHNIQUE: After written informed consent was obtained, patient was placed in the supine position on angiographic table. Patency of the right  basilic vein was confirmed with ultrasound with image documentation. An appropriate  skin site was determined. Skin site was marked. Region was prepped using maximum barrier technique including cap and mask, sterile gown, sterile gloves, large sterile sheet, and Chlorhexidine as cutaneous antisepsis. The region was infiltrated locally with 1% lidocaine. Under real-time ultrasound guidance, the right basilic vein was accessed with a 21 gauge micropuncture needle; the needle tip within the vein was confirmed with ultrasound image documentation. Needle exchanged over a 018 guidewire for a peel-away sheath, through which a 5-French single-lumen power injectable PICC trimmed to 46cm was advanced, positioned with its tip near the cavoatrial junction. Spot chest radiograph confirms appropriate catheter position. Catheter was flushed per protocol and secured externally. The patient tolerated procedure well. COMPLICATIONS: COMPLICATIONS none IMPRESSION: 1. Technically successful five [place] single lumen power injectable PICC placement Electronically Signed   By: Lucrezia Europe M.D.   On: 12/27/2018 16:15   [place] Ekg Site Rite  Result Date: 01/04/2019 If Site Rite image not attached, placement could not be confirmed due to current cardiac rhythm.   ASSESSMENT AND PLAN: 1. Pancreas head mass  CT abdomen/pelvis 08/03/2017-fullness in the head of the pancreas with stranding in the peripancreatic fat and pancreatic ductal dilatation.   MRI abdomen 08/04/2017-findings favored to represent acute on chronic pancreatitis; equivocal soft tissue fullness within the head and uncinate process of the pancreas; indeterminate too small to characterize right hepatic lobe lesion; small volume abdominal ascites; splenomegaly.   CA-19-9 elevated at 977on 08/04/2017.  Status postupper EUS 08/10/2017-findings of an irregular masslike region in the pancreatic head measuring approximately 2.7 cm. This directly abutted the main portal vein but  no other major vascular structures. Biopsies obtained with preliminary cytology positive for malignancy, likely adenocarcinoma. The final report is pending. The common bile duct and main pancreatic duct were both dilated. ERCP was then proceeded with forstent placement. Multiple attempts were made tocannulate the bile duct without success. The procedure was aborted.  08/16/2017 ERCP with placement of a metal biliary stent in the common bile duct by Dr. Francella Solian at [facility].  Cycle 1 FOLFIRINOX 08/31/2017  Cycle 2 FOLFIRINOX 09/14/2017  Cycle 3 FOLFIRINOX 09/28/2017  Cycle 4 FOLFIRINOX 10/12/2017 (oxaliplatin dose reduced secondary to thrombocytopenia)  Cycle 5 FOLFIRINOX 10/26/2017  Cycle 6 FOLFIRINOX 11/09/2017  [facility] CT[facility] at [facility] 11/29/2017-no definitive evidence of distant metastatic disease. 2 subcentimeter liver lesions described on prior MRI not visualized on CT. Ill-defined pancreatic head mass stable to decreased in size measuring 2.1 x 2 cm. Peripancreatic inflammatory stranding decreased from prior. No biliary ductal dilatation. Celiac axis less than 180 degrees abutment. Common hepatic artery with greater than 180 degrees encasement; superior mesenteric artery with short segment less than 180 degrees abutment; portal vein/superior mesenteric vein with greater than 180 degrees encasement of the extrahepatic portal vein with associated circumferential narrowing at the portomesenteric, overall substantially improved from prior examination. Now less than 180 degree abutment of the SMV. The portal vein and SMV remain patent. Splenic vein patent.  Cycle 1 gemcitabine/Abraxane 12/06/2017  Cycle 2 gemcitabine/Abraxane 12/20/2017  Cycle 3 gemcitabine/Abraxane 01/03/2018  Cycle 4 gemcitabine/Abraxane 01/17/2018  Cycle 5 gemcitabine 01/31/2018 (Abraxane held due to neuropathy)  Cycle 6 gemcitabine 02/14/2018 (Abraxane held due to neuropathy)  Cycle 7 gemcitabine 02/28/2018 (Abraxane held  due to neuropathy)  CT chest/abdomen/pelvis at [facility] 03/08/2018-stable appearance of previously identified infiltrating pancreatic head mass. No CT evidence of metastatic disease.  SBRT to the pancreas 04/05/2018-04/11/2018  05/09/2018 CA-19-9 2138   MRI abdomen 05/09/2018-similar 6 mm hypoenhancing focus posterior right lobe liver; more  superior lesion near the dome of the liver not identified on the postcontrast imaging but there is a persistent focus of diffusion signal abnormality in this region; new 8 mm hypoenhancing focus located just superior to the gallbladder; additional tiny focus of diffusion abnormality located more superiorly without obvious associated abnormal enhancement. Ill-defined pancreatic head mass not felt to be significantly changed.  CT chest/abdomen/pelvis 05/10/2018-ill-defined hypoattenuating pancreatic head mass and ill-defined soft tissue abutting the celiac axis with mildly increased dilatation of the main pancreatic duct. New soft tissue nodule along the right upper lobe bronchus; hypoattenuating liver lesions concerning for metastasis.  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 The stent traverses the pancreatic mass and extends into the duodenum. Increase in size of 2 liver lesions. Pancreatic mass appears similar. Indistinct tissue planes around the duodenum along with wall thickening in the gallbladder and especially the gallbladder neck.  MRI/MRCP 08/08/2018-new and enlarging hepatic metastases, increase in size of the pancreatic head tumor, mild intrahepatic biliary dilatation, thrombosis in segment 7 portal  vein tributary, flattening of the main portal vein  ERCP 08/16/2018-2 plastic biliary stents were removed from within the previously placed partially covered SEMS; stricture confirmed just proximal to the previously placed SEMS, treated with placement of an 8 cm long 10 mm diameter uncovered SEMS in good position.  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 Diffuse hepatic metastasis. Previous index lesions unchanged in size. Single new metastasis identified within the medial segment left lobe of liver. 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 Abdominal ascites increased in the interval.  Cycle 7 FOLFIRINOX6/02/2019 2. Hypertension 3. Port-A-Cath placement 08/30/2017 4. History of elevated bilirubin-questionGilbert's syndrome 5. Mild leukopenia, thrombocytopenia-question secondary to portal hypertension/splenomegaly;09/14/2017 white count improved, platelet count stable;progressive thrombocytopenia following gemcitabine/Abraxane 6. Kidney stones 7. Fever following cycles 2 and 3 gemcitabine/Abraxane,?  Fever related to gemcitabine 8.Jaundice, elevated liver enzymes and bilirubin 06/22/2018; status post ERCP 06/25/2018 with debris removed from the existing stent and a new plastic stent placed. Recurrent jaundice 07/19/2018 status post ERCP 07/20/2018 with finding that the previously placed plastic stent had migrated distally. There was debris and sludge on the stent. The previously placed stent was removed. 5 to 8 mm stricture just proximal to the existing SEMS. 2 plastic stents were placed.ERCP 08/16/2018-2 plastic biliary stents were removed from within the previously placed partially covered SEMS; stricture confirmed just proximal to the previously placed SEMS, treated with placement of an 8 cm long 10 mm diameter uncovered   SEMS in good position. 9.Hospitalized with bacteremia Streptococcus group C 11/04/2018 through 11/08/2018. IV ceftriaxone through 11/17/2018. TEE 11/08/2018 negative. 10.Elevated LFTs 12/03/2018-MRI abdomen 12/04/2018 with mild increase in size of mass involving head of the pancreas. Diffuse hepatic metastasis. Previous index lesions unchanged in size. Single new metastasis identified within the medial segment left lobe of liver. 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 Abdominal ascites increased in the interval. 12/06/2018 ERCP-previously placed uncovered 8 cm long metal biliary stent nearly filled with bio debris and flecks of tissue ingrowth. Stent cleared. 11. 12/24/18 -admission with sepsis syndrome, blood culture from right arm positive for Streptococcus Anginosis, blood culture from Port-A-Cath with a Bacteroides species 12.  12/28/2018-readmission with sepsis syndrome 13. 01/09/2019 -readmission with hyperbilirubinemia- ERCP 01/10/2019 confirmed tumor ingrowth and debris in the bile duct stent, status post debridement of the stent and placement of a stent and stent  ASSESSMENT/PLAN: Mr. Dunshee appears stable.  He underwent ERCP on  01/10/2019 which showed extensive tumor ingrowth for disease progression.  He also had a paracentesis performed on 01/10/2019 - cytology pending.  Total bilirubin and LFTs are trending downward.  BC from 7/10 show Enterococcus avium in aerobic bottle only. On Meropenem. Remains on anidulafungin also. ID following.  He continues to have pancytopenia secondary to recent chemotherapy is overall stable.  1.  The patient has disease progression noted by the tumor ingrowth into his stent.  This is been discussed with him.  Cytology from paracentesis is pending. He understands further chemotherapy may not be an option.  Recommend hospice care if the peritoneal fluid cytology returns positive. Palliative care team is following.  2.  BC from 7/10 show gram-positive cocci. IV antibiotics and IV antifungal per ID. 3. Gastroenterology can recommend a diuretic regimen if the ascites is felt to be related to portal hypertension. 4.  Continue to monitor CBC and transfuse as needed.  No transfusion is indicated today.  He is scheduled for outpatient follow-up on 01/15/2019. If he is not discharged today, we can delay this appointment.    LOS: 5 days   Mikey Bussing, DNP, AGPCNP-BC, AOCNP 01/14/19  Mr. Rowland appears unchanged.  The cytology from the paracentesis 01/10/2019 is pending.  The liver enzymes are improved following the ERCP and stent placement.  He continues antibiotics as recommended by infectious disease.  I will recommend hospice care if the ascites returns positive for metastatic carcinoma. Mr. Lemmerman has a poor prognosis.  I will discuss the indication for continuing salvage systemic therapy in the setting of recurrent polymicrobial sepsis and biliary obstruction.  Outpatient follow-up will be scheduled at the Cancer center.

## 2019-01-14 NOTE — Progress Notes (Signed)
PHARMACY CONSULT NOTE FOR:  OUTPATIENT  PARENTERAL ANTIBIOTIC THERAPY (OPAT)  Indication: polymicrobial sepsis, candidemia Regimen: meropenem 1 gm IV q 8 hrs AND caspofungin 50 mg IV daily End date: Meropenem until 02/06/2019 (4 weeks), Caspofungin until 02/20/2019 (6 weeks)  IV antibiotic discharge orders are pended. To discharging provider:  please sign these orders via discharge navigator,  Select New Orders & click on the button choice - Manage This Unsigned Work.     Thank you for allowing pharmacy to be a part of this patient's care.  Doreene Eland, PharmD, BCPS.   Work Cell: [phone] 01/14/2019 12:27 PM

## 2019-01-14 NOTE — TOC Progression Note (Signed)
Transition of Care [facility]) - Progression Note    Patient Details  Name: Cory Kelly MRN: [id number] Date of Birth: 1955/11/06  Transition of Care [facility]) CM/SW Contact  Servando Snare, [place] Phone Number: 01/14/2019, 3:35 PM  Clinical Narrative:  Spouse had questions regarding hospice at home. LCSW made referral to [facility] to answer questions.     No CSW needs at this time.          Expected Discharge Plan and Services                                                 Social Determinants of Health (SDOH) Interventions    Readmission Risk Interventions Readmission Risk Prevention Plan 12/31/2018  Transportation Screening Complete  Medication Review ([place]) Complete  PCP or Specialist appointment within 3-5 days of discharge Complete  HRI or Home Care Consult Patient refused  SW Recovery Care/Counseling Consult Patient refused  Palliative Care Screening Not [place] Not Applicable  Some recent data might be hidden

## 2019-01-14 NOTE — Progress Notes (Signed)
[name] Kitchen  PROGRESS NOTE    Cory Kelly  KCL:[id number] DOB: 11-Mar-1956 DOA: 01/09/2019 PCP: Mancel Bale, PA-C   Brief Narrative:   Cory Kelly a 63 y.o.malewith medical history significant ofHTN, pancreatic cancer, bacteremia. He presents today w/ complaints of generalized weakness and falls. He reports multiple falls over the last several months. They generally do not involve chest pain, palpitations, dizziness, LOC. He reports that his most recent fall was 2 days ago, but this was due to tripping over his dog. He says that he generally feels unsteady on his feet.   He was following up with his oncology team today (history of pancreatic cancer) and was found to have elevated LFTs/bilirubin. They became concerned and spoke with GI. He was sent to the ED and evaluated by the GI team. They believe he may have a problem with his biliary stent and take him for ERCP in the morning.  Of note, he has had multiple recent admissions. He was recently diagnosed w/ strep bacteremia and candidemia. He was placed on long term IV abx.   Assessment & Plan:   Active Problems:   Essential hypertension   Hyperlipemia   Elevated LFTs   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[place])   Streptococcal bacteremia   Candidemia ([place])   Falls   Bile duct obstruction   Falls - reports multiple falls over the last several months and a general feeling of being unsteady on his feet.  - most recent fall 2 nights ago; no LOC, head injury; Mechanism of fall was tripping over his moving dog - films negative for Fx - PT rec HHPT, he has declined  Elevated LFT/Hyperbilirubinemia - history of pancreatic CA w/ biliary stent -s/p ERCP: see GI note; stent occluded, stented the stent  Pancreatic CA - Follows with Dr. Benay Spice - s/p 7 cycles FOLFIRI - they note a 21lb weight increase since 12/24/2018 and were scheduling him for paracentesis; consider completion after ERCP  -onco onboard - poor prognosis; family would like to speak with palliative care, they have been consulted - now s/p paracentesis as well  Streptococcal bacteremia/Candidemia - currently on ertapenem through 01/26/2019 and capsofungin through 02/09/2019; continue - Heme/onc requesting review by ID, will speak with ID - appreciate ID assistance; abx transitioned  Enterococcus/Coag-neg staph bacteremia - labs drawn from picc - pt started on vanc ON w/ pharm consult - will speak with ID -Appreciate ID assistance; will need PICC holiday     - repeat Bld Cx 1/2 positive for enterococcus; order rpt Bld Cx, check echo  HTN - continue amlodipine, irbesartan  Spoke with ID. 1/2 Bld Cx positive for enterococcus. Will order rpt Bld Cx and echo per their rec.   DVT prophylaxis:heparin Code Status:FULL Disposition Plan:TBD   Consultants:  GI  ID  Oncology  Procedures:  ERCP  Paracentesis  Antimicrobials:  Eraxis, merrem  Subjective: "I want to hear good news."  Objective: Vitals:   01/13/19 0526 01/13/19 1332 01/13/19 2003 01/14/19 0550  BP: 124/81 122/82 130/85 136/83  Pulse: 72 72 67 69  Resp: 19 16 18 18   Temp: 97.6 F (36.4 C) 98.6 F (37 C) 98.4 F (36.9 C) 98.3 F (36.8 C)  TempSrc: Oral Oral Oral Oral  SpO2: 94% 96% 95% 95%  Weight:      Height:        Intake/Output Summary (Last 24 hours) at 01/14/2019 1348 Last data filed at 01/14/2019 1230 Gross per 24 hour  Intake 1020 ml  Output  650 ml  Net 370 ml   Filed Weights   01/10/19 1140  Weight: 88.6 kg    Examination:  General:62 y.o.maleresting in bed in NAD Cardiovascular: RRR, +S1, S2, no m/g/r, equal pulses throughout Respiratory: CTABL, no w/r/r, normal WOB GI: BS+,ND, TTP RUQ,no masses noted, no organomegaly noted MSK: No e/c/c Skin: No rashes, ulcerations noted; multiple bruises noted on b/l UE, he is jaundiced  Neuro:alert to name follows commands Psyc: Appropriate interaction and affect, calm/cooperative    Data Reviewed: I have personally reviewed following labs and imaging studies.  CBC: Recent Labs  Lab 01/09/19 1223 01/10/19 0352 01/11/19 0430 01/12/19 0753 01/13/19 0635  WBC 4.1 3.7* 2.5* 2.6* 2.0*  NEUTROABS 3.6  --  2.0 2.1 1.5*  HGB 10.7* 8.7* 8.1* 8.1* 8.4*  HCT 33.5* 27.9* 26.2* 25.4* 27.2*  MCV 87.9 88.3 88.5 88.8 89.2  PLT 63* 59* 61* 69* 90*   Basic Metabolic Panel: Recent Labs  Lab 01/09/19 1223 01/10/19 0352 01/11/19 0430 01/12/19 0753 01/13/19 0635  NA 130* 130* 136 132* 131*  K 4.0 4.2 4.1 3.9 3.9  CL 95* 95* 101 96* 96*  CO2 27 26 28 28 28   GLUCOSE 110* 101* 115* 117* 103*  BUN 7* 8 10 11 10   CREATININE 0.67 0.40* 0.51* 0.49* 0.47*  CALCIUM 8.0* 7.8* 7.8* 7.6* 7.7*  MG 1.6* 1.8 2.0 2.0 1.9  PHOS  --   --  3.4  --  2.8   GFR: Estimated Creatinine Clearance: 121.5 mL/min (A) (by C-G formula based on SCr of 0.47 mg/dL (L)). Liver Function Tests: Recent Labs  Lab 01/09/19 1223 01/10/19 0352 01/11/19 0430 01/12/19 0753 01/13/19 0635  AST 268* 171* 98* 53*  --   ALT 175* 133* 100* 71*  --   ALKPHOS 1,031* 778* 749* 711*  --   BILITOT 8.0* 9.0* 7.1* 5.0*  --   PROT 5.0* 4.9* 4.4* 4.4*  --   ALBUMIN 2.1* 2.1* 1.9* 1.8* 1.9*   No results for input(s): LIPASE, AMYLASE in the last 168 hours. No results for input(s): AMMONIA in the last 168 hours. Coagulation Profile: Recent Labs  Lab 01/10/19 0352  INR 1.1   Cardiac Enzymes: No results for input(s): CKTOTAL, CKMB, CKMBINDEX, TROPONINI in the last 168 hours. BNP (last 3 results) No results for input(s): PROBNP in the last 8760 hours. HbA1C: No results for input(s): HGBA1C in the last 72 hours. CBG: No results for input(s): GLUCAP in the last 168 hours. Lipid Profile: No results for input(s): CHOL, HDL, LDLCALC, TRIG, CHOLHDL, LDLDIRECT in the last 72 hours. Thyroid Function Tests: No  results for input(s): TSH, T4TOTAL, FREET4, T3FREE, THYROIDAB in the last 72 hours. Anemia Panel: No results for input(s): VITAMINB12, FOLATE, FERRITIN, TIBC, IRON, RETICCTPCT in the last 72 hours. Sepsis Labs: No results for input(s): PROCALCITON, LATICACIDVEN in the last 168 hours.  Recent Results (from the past 240 hour(s))  Blood Culture (routine x 2)     Status: None   Collection Time: 01/06/19  8:41 PM   Specimen: BLOOD  Result Value Ref [place] Status   Specimen Description   Final    BLOOD RIGHT WRIST Performed at [facility] [address], [place], [place] [address]    Special Requests   Final    BOTTLES DRAWN AEROBIC AND ANAEROBIC Blood Culture results may not be optimal due to an excessive volume of blood received in culture bottles Performed at [facility] [address] [place],  [place] [address]    Culture   Final    NO GROWTH 5 DAYS Performed at [facility], [place] [address] [place], [place] [address]    Report Status 01/12/2019 FINAL  Final  Blood Culture (routine x 2)     Status: None   Collection Time: 01/06/19  8:46 PM   Specimen: BLOOD  Result Value Ref [place] Status   Specimen Description   Final    BLOOD LEFT WRIST Performed at [facility] [address], [place], [place] [address]    Special Requests   Final    BOTTLES DRAWN AEROBIC AND ANAEROBIC Blood Culture results may not be optimal due to an excessive volume of blood received in culture bottles Performed at [facility] [address], [place], [place] [address]    Culture   Final    NO GROWTH 5 DAYS Performed at [facility], [place] [address] [place], [place] [address]    Report Status 01/12/2019 FINAL  Final  Culture, Blood     Status: None (Preliminary result)   Collection Time: 01/09/19 12:30 PM   Specimen: BLOOD LEFT HAND  Result Value Ref Range Status   Specimen Description   Final    BLOOD LEFT  HAND Performed at [facility], [place] [address] [place], [place] [address]    Special Requests   Final    BOTTLES DRAWN AEROBIC AND ANAEROBIC Blood Culture results may not be optimal due to an inadequate volume of blood received in culture bottles   Culture   Final    NO GROWTH 4 DAYS Performed at [facility], [place] [address], [place], [place] [address]    Report Status PENDING  Incomplete  Culture, Blood     Status: Abnormal   Collection Time: 01/09/19  1:13 PM   Specimen: BLOOD  Result Value Ref Range Status   Specimen Description BLOOD PICC LINE  Final   Special Requests   Final    BOTTLES DRAWN AEROBIC AND ANAEROBIC Blood Culture adequate volume   Culture  Setup Time   Final    GRAM POSITIVE COCCI IN BOTH AEROBIC AND ANAEROBIC BOTTLES CRITICAL RESULT CALLED TO, READ BACK BY AND VERIFIED WITH: PHRMD B GREEN @0529  01/10/19 BY S GEZAHEGN Performed at [facility], [place] [address], [place], [place] [address]    Culture (A)  Final    ENTEROCOCCUS AVIUM STAPHYLOCOCCUS SPECIES (COAGULASE NEGATIVE)    Report Status 01/12/2019 FINAL  Final   Organism ID, Bacteria ENTEROCOCCUS AVIUM  Final      Susceptibility   Enterococcus avium - MIC*    AMPICILLIN <=2 SENSITIVE Sensitive     VANCOMYCIN <=0.5 SENSITIVE Sensitive     GENTAMICIN SYNERGY SENSITIVE Sensitive     * ENTEROCOCCUS AVIUM  Blood Culture ID Panel (Reflexed)     Status: Abnormal   Collection Time: 01/09/19  1:13 PM  Result Value Ref [place] Status   Enterococcus species DETECTED (A) NOT DETECTED Final    Comment: CRITICAL RESULT CALLED TO, READ BACK BY AND VERIFIED WITH: PHRMD B GREEN @0529  01/10/19 BY S GEZAHEGN    Vancomycin resistance NOT DETECTED NOT DETECTED Final   Listeria monocytogenes NOT DETECTED NOT DETECTED Final   Staphylococcus species DETECTED (A) NOT DETECTED Final    Comment: Methicillin (oxacillin) resistant coagulase negative staphylococcus. Possible blood culture  contaminant (unless isolated from more than one blood culture draw or clinical case suggests pathogenicity).  No antibiotic treatment is indicated for blood  culture contaminants. CRITICAL RESULT CALLED TO, READ BACK BY AND VERIFIED WITH: PHRMD B GREEN @0529  01/10/19 BY S GEZAHEGN    Staphylococcus aureus (BCID) NOT DETECTED NOT DETECTED Final   Methicillin resistance DETECTED (A) NOT DETECTED Final    Comment: CRITICAL RESULT CALLED TO, READ BACK BY AND VERIFIED WITH: PHRMD B GREEN @0529  01/10/19 BY S GEZAHEGN    Streptococcus species NOT DETECTED NOT DETECTED Final   Streptococcus agalactiae NOT DETECTED NOT DETECTED Final   Streptococcus pneumoniae NOT DETECTED NOT DETECTED Final   Streptococcus pyogenes NOT DETECTED NOT DETECTED Final   Acinetobacter baumannii NOT DETECTED NOT DETECTED Final   Enterobacteriaceae species NOT DETECTED NOT DETECTED Final   Enterobacter cloacae complex NOT DETECTED NOT DETECTED Final   Escherichia coli NOT DETECTED NOT DETECTED Final   Klebsiella oxytoca NOT DETECTED NOT DETECTED Final   Klebsiella pneumoniae NOT DETECTED NOT DETECTED Final   Proteus species NOT DETECTED NOT DETECTED Final   Serratia marcescens NOT DETECTED NOT DETECTED Final   Haemophilus influenzae NOT DETECTED NOT DETECTED Final   Neisseria meningitidis NOT DETECTED NOT DETECTED Final   Pseudomonas aeruginosa NOT DETECTED NOT DETECTED Final   Candida albicans NOT DETECTED NOT DETECTED Final   Candida glabrata NOT DETECTED NOT DETECTED Final   Candida krusei NOT DETECTED NOT DETECTED Final   Candida parapsilosis NOT DETECTED NOT DETECTED Final   Candida tropicalis NOT DETECTED NOT DETECTED Final    Comment: Performed at [facility], [place]. [address] [place], [place] [address]  SARS Coronavirus 2 (CEPHEID - Performed in [facility]), Hosp Order     Status: None   Collection Time: 01/09/19  6:07 PM   Specimen: Nasopharyngeal Swab  Result Value Ref Range Status   SARS  Coronavirus 2 NEGATIVE NEGATIVE Final    Comment: (NOTE) If result is NEGATIVE SARS-CoV-2 target nucleic acids are NOT DETECTED. The SARS-CoV-2 RNA is generally detectable in upper and lower  respiratory specimens during the acute phase of infection. The lowest  concentration of SARS-CoV-2 viral copies this assay can detect is 250  copies / mL. A negative result does not preclude SARS-CoV-2 infection  and should not be used as the sole basis for treatment or other  patient management decisions.  A negative result may occur with  improper specimen collection / handling, submission of specimen other  than nasopharyngeal swab, presence of viral mutation(s) within the  areas targeted by this assay, and inadequate number of viral copies  (<250 copies / mL). A negative result must be combined with clinical  observations, patient history, and epidemiological information. If result is POSITIVE SARS-CoV-2 target nucleic acids are DETECTED. The SARS-CoV-2 RNA is generally detectable in upper and lower  respiratory specimens dur ing the acute phase of infection.  Positive  results are indicative of active infection with SARS-CoV-2.  Clinical  correlation with patient history and other diagnostic information is  necessary to determine patient infection status.  Positive results do  not rule out bacterial infection or co-infection with other viruses. If result is PRESUMPTIVE POSTIVE SARS-CoV-2 nucleic acids MAY BE PRESENT.   A presumptive positive result was obtained on the submitted specimen  and confirmed on repeat testing.  While 2019 novel coronavirus  (SARS-CoV-2) nucleic acids may be present in the submitted sample  additional confirmatory testing may be necessary for epidemiological  and / or clinical management purposes  to differentiate between  SARS-CoV-2 and other Sarbecovirus  currently known to infect humans.  If clinically indicated additional testing with an alternate test   methodology [phone]) is advised. The SARS-CoV-2 RNA is generally  detectable in upper and lower respiratory sp ecimens during the acute  phase of infection. The expected result is Negative. Fact Sheet for Patients:  StrictlyIdeas.no Fact Sheet for Healthcare Providers: BankingDealers.co.za This test is not yet approved or cleared by the [place] FDA and has been authorized for detection and/or diagnosis of SARS-CoV-2 by FDA under an Emergency Use Authorization (EUA).  This EUA will remain in effect (meaning this test can be used) for the duration of the COVID-19 declaration under Section 564(b)(1) of the Act, 21 U.S.C. section 360bbb-3(b)(1), unless the authorization is terminated or revoked sooner. Performed at [facility], [place] [address] [place], [place] [address]   Body fluid culture     Status: None   Collection Time: 01/10/19  4:16 PM   Specimen: Peritoneal Fluid  Result Value Ref Range Status   Specimen Description   Final    PERITONEAL Performed at [facility] [address], [place], [place] [address]    Special Requests   Final    NONE Performed at [facility], [place] [address] [place], [place] [address]    Gram Stain   Final    FEW WBC PRESENT,BOTH PMN AND MONONUCLEAR NO ORGANISMS SEEN    Culture   Final    NO GROWTH 3 DAYS Performed at [facility], [place] [address], [place], [place] [address]    Report Status 01/14/2019 FINAL  Final  Urine culture     Status: None   Collection Time: 01/11/19  4:50 AM   Specimen: Urine, Clean Catch  Result Value Ref [place] Status   Specimen Description   Final    URINE, CLEAN CATCH Performed at [facility], [place] [employer] [address], [place], [place] [address]    Special Requests   Final    NONE Performed at [facility], [place] [address], [place], [place] [address]    Culture    Final    NO GROWTH Performed at [facility], [place] [address], [place], [place] [address]    Report Status 01/12/2019 FINAL  Final  Culture, blood (routine x 2)     Status: Abnormal (Preliminary result)   Collection Time: 01/11/19  4:08 PM   Specimen: BLOOD RIGHT FOREARM  Result Value Ref Range Status   Specimen Description   Final    BLOOD RIGHT FOREARM Performed at [facility] [address], [place], [place] [address]    Special Requests   Final    BOTTLES DRAWN AEROBIC AND ANAEROBIC Blood Culture adequate volume Performed at [facility] [address], [place], [place] [address]    Culture  Setup Time   Final    GRAM POSITIVE COCCI AEROBIC BOTTLE ONLY CRITICAL VALUE NOTED.  VALUE IS CONSISTENT WITH PREVIOUSLY REPORTED AND CALLED VALUE.    Culture (A)  Final    ENTEROCOCCUS AVIUM SUSCEPTIBILITIES TO FOLLOW Performed at [facility], [place] [address], [place], [place] [address]    Report Status PENDING  Incomplete  Culture, blood (routine x 2)     Status: None (Preliminary result)   Collection Time: 01/11/19  4:08 PM   Specimen: BLOOD RIGHT FOREARM  Result Value Ref Range Status   Specimen Description   Final    BLOOD RIGHT FOREARM Performed  at [facility], [place] [address], [place], [place] [address]    Special Requests   Final    BOTTLES DRAWN AEROBIC AND ANAEROBIC Blood Culture adequate volume Performed at [facility] [address], [place], [place] [address]    Culture   Final    NO GROWTH 2 DAYS Performed at [facility] [address], [place], [place] [address]    Report Status PENDING  Incomplete         Radiology Studies: No results found.      Scheduled Meds: . amLODipine  10 mg Oral QHS  . doxazosin  1 mg Oral QHS  . gabapentin  200 mg Oral BID  . heparin  5,000 Units Subcutaneous Q8H  . indomethacin  100 mg Rectal Once  . irbesartan  300 mg  Oral Daily  . magnesium oxide  400 mg Oral BID  . potassium chloride SA  20 mEq Oral BID  . sodium chloride flush  10-40 mL Intracatheter Q12H   Continuous Infusions: . sodium chloride 10 mL/hr at 01/13/19 0400  . anidulafungin 100 mg (01/13/19 2114)  . lactated ringers 1,000 mL (01/10/19 1147)  . meropenem (MERREM) IV 1 g (01/14/19 0635)     LOS: 5 days    Time spent: 35 minutes spent in the coordination of care today.     Jonnie Finner, DO [facility] Pager [phone]  If 7PM-7AM, please contact night-coverage [email or web address] Password [email or web address] 01/14/2019, 1:48 PM

## 2019-01-14 NOTE — Progress Notes (Signed)
Manufacturing engineer [facility])  Received request to speak with wife about questions surrounding hospice services at home.  [name] (wife) is aware but would like to fully explore options available for treatment.  Mariana Single that palliative in the community would probably suit their immediate needs better, knowing that hospice can be started at any time.  She states he wants to go home ASAP and this is pending the availability of having a PICC placed for IV abx.  Will ask attending MD if alternate is available so the pt can go home soon, knowing his time is short.  [name] wanted to have community palliative for now, until full scope of treatment has been explored.  She has our 24 hour # as well as our palliative department # that she can call should questions arise or circumstances change.  Venia Carbon RN, BN, [facility] Liaison (in [place]) [phone]

## 2019-01-15 ENCOUNTER — Ambulatory Visit: Payer: BC Managed Care – PPO

## 2019-01-15 ENCOUNTER — Ambulatory Visit: Payer: BC Managed Care – PPO | Admitting: Nurse Practitioner

## 2019-01-15 ENCOUNTER — Other Ambulatory Visit: Payer: BC Managed Care – PPO

## 2019-01-15 DIAGNOSIS — Z9689 Presence of other specified functional implants: Secondary | ICD-10-CM

## 2019-01-15 DIAGNOSIS — I330 Acute and subacute infective endocarditis: Secondary | ICD-10-CM

## 2019-01-15 DIAGNOSIS — R110 Nausea: Secondary | ICD-10-CM

## 2019-01-15 LAB — RENAL FUNCTION PANEL
Albumin: 2.2 g/dL — ABNORMAL LOW (ref 3.5–5.0)
Anion gap: 11 (ref 5–15)
BUN: 7 mg/dL — ABNORMAL LOW (ref 8–23)
CO2: 25 mmol/L (ref 22–32)
Calcium: 8 mg/dL — ABNORMAL LOW (ref 8.9–10.3)
Chloride: 96 mmol/L — ABNORMAL LOW (ref 98–111)
Creatinine, Ser: 0.55 mg/dL — ABNORMAL LOW (ref 0.61–1.24)
GFR calc Af Amer: >60 mL/min (ref >60)
GFR calc non Af Amer: >60 mL/min (ref >60)
Glucose, Bld: 122 mg/dL — ABNORMAL HIGH (ref 70–99)
Phosphorus: 2.8 mg/dL (ref 2.5–4.6)
Potassium: 4.1 mmol/L (ref 3.5–5.1)
Sodium: 132 mmol/L — ABNORMAL LOW (ref 135–145)

## 2019-01-15 LAB — CBC WITH DIFFERENTIAL/PLATELET
Abs Immature Granulocytes: 0.01 10*3/uL (ref 0.00–0.07)
Basophils Absolute: 0 10*3/uL (ref 0.0–0.1)
Basophils Relative: 1 %
Eosinophils Absolute: 0 10*3/uL (ref 0.0–0.5)
Eosinophils Relative: 1 %
HCT: 29.7 % — ABNORMAL LOW (ref 39.0–52.0)
Hemoglobin: 9.2 g/dL — ABNORMAL LOW (ref 13.0–17.0)
Immature Granulocytes: 0 %
Lymphocytes Relative: 11 %
Lymphs Abs: 0.3 10*3/uL — ABNORMAL LOW (ref 0.7–4.0)
MCH: 28.2 pg (ref 26.0–34.0)
MCHC: 31 g/dL (ref 30.0–36.0)
MCV: 91.1 fL (ref 80.0–100.0)
Monocytes Absolute: 0.2 10*3/uL (ref 0.1–1.0)
Monocytes Relative: 10 %
Neutro Abs: 2 10*3/uL (ref 1.7–7.7)
Neutrophils Relative %: 77 %
Platelets: 121 10*3/uL — ABNORMAL LOW (ref 150–400)
RBC: 3.26 MIL/uL — ABNORMAL LOW (ref 4.22–5.81)
RDW: 15.7 % — ABNORMAL HIGH (ref 11.5–15.5)
WBC: 2.5 10*3/uL — ABNORMAL LOW (ref 4.0–10.5)
nRBC: 0 % (ref 0.0–0.2)

## 2019-01-15 LAB — CULTURE, BLOOD (ROUTINE X 2): Special Requests: ADEQUATE

## 2019-01-15 MED ORDER — SPIRONOLACTONE 100 MG PO TABS
100.0000 mg | ORAL_TABLET | Freq: Every day | ORAL | Status: DC
  Administered 2019-01-15 – 2019-01-16 (×2): 100 mg via ORAL
  Filled 2019-01-15 (×2): qty 1

## 2019-01-15 NOTE — Progress Notes (Signed)
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 Discussed goals of care at length with the patient, his hospitalist, and oncologist today. He is anxious to have PICC replaced and go home tomorrow but he understands that his infection is most likely incurable, particularly given the persistent focus of his metastatic pancreatic CA and permanent biliary stents in place. Appetite remains poor and states he feels he is "getting weaker." Blood cxs, fever curve, labs, and medications all independently reviewed    Current Facility-Administered Medications:  .  0.9 %  sodium chloride infusion, , Intravenous, PRN, Marylyn Ishihara, Tyrone A, DO, Last Rate: 10 mL/hr at 01/13/19 0400 .  amLODipine (NORVASC) tablet 10 mg, 10 mg, Oral, QHS, Milus Banister, MD, 10 mg at 01/14/19 2059 .  anidulafungin (ERAXIS) 100 mg in sodium chloride 0.9 % 100 mL IVPB, 100 mg, Intravenous, Q24H, Milus Banister, MD, Last Rate: 78 mL/hr at 01/14/19 2050, 100 mg at 01/14/19 2050 .  diphenhydrAMINE (BENADRYL) capsule 25 mg, 25 mg, Oral, Q6H PRN, Lennox Grumbles, Belinda K, NP, 25 mg at 01/14/19 2059 .  doxazosin (CARDURA) tablet 1 mg, 1 mg, Oral, QHS, Milus Banister, MD, 1 mg at 01/14/19 2059 .  gabapentin (NEURONTIN) capsule 200 mg, 200 mg, Oral, BID, Milus Banister, MD, 200 mg at 01/15/19 4287 .  heparin injection 5,000 Units, 5,000 Units, Subcutaneous, Q8H, Milus Banister, MD, 5,000 Units at 01/15/19 0520 .  hydrocortisone (ANUSOL-HC) 2.5 % rectal cream 1 application, 1 application, Topical, QID PRN, Milus Banister, MD .  indomethacin (INDOCIN) 50 MG suppository 100 mg, 100 mg, Rectal, Once, Milus Banister, MD .  irbesartan Levy Sjogren) tablet 300 mg, 300 mg, Oral, Daily, Milus Banister, MD, 300 mg at 01/15/19 6811 .  lactated ringers  infusion, , Intravenous, Continuous, Milus Banister, MD, Last Rate: 10 mL/hr at 01/10/19 1147 .  loperamide (IMODIUM) capsule 2-4 mg, 2-4 mg, Oral, TID PRN, Milus Banister, MD .  magnesium oxide (MAG-OX) tablet 400 mg, 400 mg, Oral, BID, Milus Banister, MD, 400 mg at 01/15/19 5726 .  meropenem (MERREM) 1 g in sodium chloride 0.9 % 100 mL IVPB, 1 g, Intravenous, Q8H, Jocelin Schuelke, Evern Core, MD, Last Rate: 200 mL/hr at 01/15/19 0520, 1 g at 01/15/19 0520 .  oxyCODONE-acetaminophen (PERCOCET/ROXICET) 5-325 MG per tablet 1 tablet, 1 tablet, Oral, Q4H PRN, 1 tablet at 01/14/19 2059 **AND** oxyCODONE (Oxy IR/ROXICODONE) immediate release tablet 5 mg, 5 mg, Oral, Q4H PRN, Milus Banister, MD, 5 mg at 01/14/19 2035 .  potassium chloride SA (K-DUR) CR tablet 20 mEq, 20 mEq, Oral, BID, Milus Banister, MD, 20 mEq at 01/15/19 0907 .  prochlorperazine (COMPAZINE) tablet 10 mg, 10 mg, Oral, Q6H PRN, Milus Banister, MD, 10 mg at 01/11/19 1250 .  sodium chloride flush (NS) 0.9 % injection 10-40 mL, 10-40 mL, Intracatheter, Q12H, Milus Banister, MD, 10 mL at 01/12/19 0959 .  sodium chloride flush (NS) 0.9 % injection 10-40 mL, 10-40 mL, Intracatheter, PRN, Milus Banister, MD, 10 mL at 01/11/19 2152 .  spironolactone (ALDACTONE) tablet 100 mg, 100 mg, Oral, Daily, Kyle, Tyrone A, DO, 100 mg at 01/15/19 0907   Physical Exam:   Vitals:   01/14/19 2032 01/15/19 0503  BP: 132/83 135/85  Pulse: 77 67  Resp:  17 17  Temp: 98.4 F (36.9 C) 98.5 F (36.9 C)  SpO2: 94% 94%   Physical Exam Gen: pleasant, NAD, A&Ox 3, jaundice improving Head: NCAT, no temporal wasting evident EENT: PERRL, EOMI, MMM, mild scleral icterus, adequate dentition Neck: supple, no JVD CV: NRRR, II/VI SEM at LLSB Pulm: CTA bilaterally, no wheeze or retractions Abd: soft, mild RUQ TTP OTW NTND, +BS Extrems: trace LE edema, 2+ pulses Skin: no rashes, adequate skin turgor Neuro: CN II-XII grossly intact, no focal neurologic deficits  appreciated, gait was not assessed, A&Ox 3   Review of Systems:  Review of Systems  Constitutional: Negative for chills, fever and weight loss.  HENT: Negative for congestion, hearing loss, sinus pain and sore throat.   Eyes: Negative for blurred vision, photophobia and discharge.  Respiratory: Negative for cough, hemoptysis and shortness of breath.   Cardiovascular: Negative for chest pain, palpitations, orthopnea and leg swelling.  Gastrointestinal: Positive for nausea. Negative for abdominal pain, constipation, diarrhea, heartburn and vomiting.  Genitourinary: Negative for dysuria, flank pain, frequency and urgency.  Musculoskeletal: Negative for back pain, joint pain and myalgias.  Skin: Positive for itching. Negative for rash.  Neurological: Negative for tremors, seizures, weakness and headaches.  Endo/Heme/Allergies: Negative for polydipsia. Does not bruise/bleed easily.  Psychiatric/Behavioral: Negative for depression and substance abuse. The patient is nervous/anxious and has insomnia.      Lab Results  Component Value Date   WBC 2.5 (L) 01/15/2019   HGB 9.2 (L) 01/15/2019   HCT 29.7 (L) 01/15/2019   MCV 91.1 01/15/2019   PLT 121 (L) 01/15/2019    Lab Results  Component Value Date   CREATININE 0.55 (L) 01/15/2019   BUN 7 (L) 01/15/2019   NA 132 (L) 01/15/2019   K 4.1 01/15/2019   CL 96 (L) 01/15/2019   CO2 25 01/15/2019    Lab Results  Component Value Date   ALT 71 (H) 01/12/2019   AST 53 (H) 01/12/2019   ALKPHOS 711 (H) 01/12/2019     Microbiology: Recent Results (from the past 240 hour(s))  Blood Culture (routine x 2)     Status: None   Collection Time: 01/06/19  8:41 PM   Specimen: BLOOD  Result Value Ref Range Status   Specimen Description   Final    BLOOD RIGHT WRIST Performed at [facility], [place] [address], [place], [place] [address]    Special Requests   Final    BOTTLES DRAWN AEROBIC AND ANAEROBIC Blood Culture results may not  be optimal due to an excessive volume of blood received in culture bottles Performed at [place] [address] [place], [place] [address]    Culture   Final    NO GROWTH 5 DAYS Performed at [facility], [place] [address] [place], [place] [address]    Report Status 01/12/2019 FINAL  Final  Blood Culture (routine x 2)     Status: None   Collection Time: 01/06/19  8:46 PM   Specimen: BLOOD  Result Value Ref Range Status   Specimen Description   Final    BLOOD LEFT WRIST Performed at [facility] [address], [place], [place] [address]    Special Requests   Final    BOTTLES DRAWN AEROBIC AND ANAEROBIC Blood Culture results may not be optimal due to an excessive volume of blood received in culture bottles Performed at [place] [address], [place], [place] [address]  Culture   Final    NO GROWTH 5 DAYS Performed at [facility], [place] [address], [place], [place] [address]    Report Status 01/12/2019 FINAL  Final  Culture, Blood     Status: None   Collection Time: 01/09/19 12:30 PM   Specimen: BLOOD LEFT HAND  Result Value Ref Range Status   Specimen Description   Final    BLOOD LEFT HAND Performed at [facility], [place] [address], [place], [place] [address]    Special Requests   Final    BOTTLES DRAWN AEROBIC AND ANAEROBIC Blood Culture results may not be optimal due to an inadequate volume of blood received in culture bottles   Culture   Final    NO GROWTH 5 DAYS Performed at [facility], [place] [address] [place], [place] [address]    Report Status 01/14/2019 FINAL  Final  Culture, Blood     Status: Abnormal   Collection Time: 01/09/19  1:13 PM   Specimen: BLOOD  Result Value Ref Range Status   Specimen Description BLOOD PICC LINE  Final   Special Requests   Final    BOTTLES DRAWN AEROBIC AND ANAEROBIC Blood Culture adequate volume   Culture  Setup Time    Final    GRAM POSITIVE COCCI IN BOTH AEROBIC AND ANAEROBIC BOTTLES CRITICAL RESULT CALLED TO, READ BACK BY AND VERIFIED WITH: PHRMD B GREEN _0  01/10/19 BY S GEZAHEGN Performed at [facility], [place] [address], [place], [place] [address]    Culture (A)  Final    ENTEROCOCCUS AVIUM STAPHYLOCOCCUS SPECIES (COAGULASE NEGATIVE)    Report Status 01/12/2019 FINAL  Final   Organism ID, Bacteria ENTEROCOCCUS AVIUM  Final      Susceptibility   Enterococcus avium - MIC*    AMPICILLIN <=2 SENSITIVE Sensitive     VANCOMYCIN <=0.5 SENSITIVE Sensitive     GENTAMICIN SYNERGY SENSITIVE Sensitive     * ENTEROCOCCUS AVIUM  Blood Culture ID Panel (Reflexed)     Status: Abnormal   Collection Time: 01/09/19  1:13 PM  Result Value Ref Range Status   Enterococcus species DETECTED (A) NOT DETECTED Final    Comment: CRITICAL RESULT CALLED TO, READ BACK BY AND VERIFIED WITH: PHRMD B GREEN _1  01/10/19 BY S GEZAHEGN    Vancomycin resistance NOT DETECTED NOT DETECTED Final   Listeria monocytogenes NOT DETECTED NOT DETECTED Final   Staphylococcus species DETECTED (A) NOT DETECTED Final    Comment: Methicillin (oxacillin) resistant coagulase negative staphylococcus. Possible blood culture contaminant (unless isolated from more than one blood culture draw or clinical case suggests pathogenicity). No antibiotic treatment is indicated for blood  culture contaminants. CRITICAL RESULT CALLED TO, READ BACK BY AND VERIFIED WITH: PHRMD B GREEN _2  01/10/19 BY S GEZAHEGN    Staphylococcus aureus (BCID) NOT DETECTED NOT DETECTED Final   Methicillin resistance DETECTED (A) NOT DETECTED Final    Comment: CRITICAL RESULT CALLED TO, READ BACK BY AND VERIFIED WITH: PHRMD B GREEN _3  01/10/19 BY S GEZAHEGN    Streptococcus species NOT DETECTED NOT DETECTED Final   Streptococcus agalactiae NOT DETECTED NOT DETECTED Final   Streptococcus pneumoniae NOT DETECTED NOT DETECTED Final   Streptococcus pyogenes NOT DETECTED  NOT DETECTED Final   Acinetobacter baumannii NOT DETECTED NOT DETECTED Final   Enterobacteriaceae species NOT DETECTED NOT DETECTED Final   Enterobacter cloacae complex NOT DETECTED NOT DETECTED Final   Escherichia coli NOT DETECTED NOT DETECTED Final  Klebsiella oxytoca NOT DETECTED NOT DETECTED Final   Klebsiella pneumoniae NOT DETECTED NOT DETECTED Final   Proteus species NOT DETECTED NOT DETECTED Final   Serratia marcescens NOT DETECTED NOT DETECTED Final   Haemophilus influenzae NOT DETECTED NOT DETECTED Final   Neisseria meningitidis NOT DETECTED NOT DETECTED Final   Pseudomonas aeruginosa NOT DETECTED NOT DETECTED Final   Candida albicans NOT DETECTED NOT DETECTED Final   Candida glabrata NOT DETECTED NOT DETECTED Final   Candida krusei NOT DETECTED NOT DETECTED Final   Candida parapsilosis NOT DETECTED NOT DETECTED Final   Candida tropicalis NOT DETECTED NOT DETECTED Final    Comment: Performed at [facility], [place] [address], [place], [place] [address]  SARS Coronavirus 2 (CEPHEID - Performed in [facility] lab), Hosp Order     Status: None   Collection Time: 01/09/19  6:07 PM   Specimen: Nasopharyngeal Swab  Result Value Ref Range Status   SARS Coronavirus 2 NEGATIVE NEGATIVE Final    Comment: (NOTE) If result is NEGATIVE SARS-CoV-2 target nucleic acids are NOT DETECTED. The SARS-CoV-2 RNA is generally detectable in upper and lower  respiratory specimens during the acute phase of infection. The lowest  concentration of SARS-CoV-2 viral copies this assay can detect is 250  copies / mL. A negative result does not preclude SARS-CoV-2 infection  and should not be used as the sole basis for treatment or other  patient management decisions.  A negative result may occur with  improper specimen collection / handling, submission of specimen other  than nasopharyngeal swab, presence of viral mutation(s) within the  areas targeted by this assay, and inadequate number  of viral copies  (<250 copies / mL). A negative result must be combined with clinical  observations, patient history, and epidemiological information. If result is POSITIVE SARS-CoV-2 target nucleic acids are DETECTED. The SARS-CoV-2 RNA is generally detectable in upper and lower  respiratory specimens dur ing the acute phase of infection.  Positive  results are indicative of active infection with SARS-CoV-2.  Clinical  correlation with patient history and other diagnostic information is  necessary to determine patient infection status.  Positive results do  not rule out bacterial infection or co-infection with other viruses. If result is PRESUMPTIVE POSTIVE SARS-CoV-2 nucleic acids MAY BE PRESENT.   A presumptive positive result was obtained on the submitted specimen  and confirmed on repeat testing.  While 2019 novel coronavirus  (SARS-CoV-2) nucleic acids may be present in the submitted sample  additional confirmatory testing may be necessary for epidemiological  and / or clinical management purposes  to differentiate between  SARS-CoV-2 and other Sarbecovirus currently known to infect humans.  If clinically indicated additional testing with an alternate test  methodology [phone]) is advised. The SARS-CoV-2 RNA is generally  detectable in upper and lower respiratory sp ecimens during the acute  phase of infection. The expected result is Negative. Fact Sheet for Patients:  StrictlyIdeas.no Fact Sheet for Healthcare Providers: BankingDealers.co.za This test is not yet approved or cleared by the [place] FDA and has been authorized for detection and/or diagnosis of SARS-CoV-2 by FDA under an Emergency Use Authorization (EUA).  This EUA will remain in effect (meaning this test can be used) for the duration of the COVID-19 declaration under Section 564(b)(1) of the Act, 21 U.S.C. section 360bbb-3(b)(1), unless the authorization is  terminated or revoked sooner. Performed at [facility], [place] [address] [place], [place] [address]   Body fluid culture  Status: None   Collection Time: 01/10/19  4:16 PM   Specimen: Peritoneal Fluid  Result Value Ref Range Status   Specimen Description   Final    PERITONEAL Performed at [place] [address], [place], [place] [address]    Special Requests   Final    NONE Performed at [facility], [place] [address], [place], [place] [address]    Gram Stain   Final    FEW WBC PRESENT,BOTH PMN AND MONONUCLEAR NO ORGANISMS SEEN    Culture   Final    NO GROWTH 3 DAYS Performed at [facility], [place] [address], [place], [place] [address]    Report Status 01/14/2019 FINAL  Final  Urine culture     Status: None   Collection Time: 01/11/19  4:50 AM   Specimen: Urine, Clean Catch  Result Value Ref Range Status   Specimen Description   Final    URINE, CLEAN CATCH Performed at [facility], [place] [address] [place], [place] [address]    Special Requests   Final    NONE Performed at [facility], [place] [address] [place], [place] [address]    Culture   Final    NO GROWTH Performed at [facility], [place] [address] [place], [place] [address]    Report Status 01/12/2019 FINAL  Final  Culture, blood (routine x 2)     Status: Abnormal   Collection Time: 01/11/19  4:08 PM   Specimen: BLOOD RIGHT FOREARM  Result Value Ref Range Status   Specimen Description   Final    BLOOD RIGHT FOREARM Performed at [place] [address] [place], [place] [address]    Special Requests   Final    BOTTLES DRAWN AEROBIC AND ANAEROBIC Blood Culture adequate volume Performed at [place] [address], [place], [place] [address]    Culture  Setup Time   Final    GRAM POSITIVE COCCI AEROBIC BOTTLE ONLY CRITICAL VALUE NOTED.  VALUE IS  CONSISTENT WITH PREVIOUSLY REPORTED AND CALLED VALUE. Performed at [facility], [place] [address] [place], [place] [address]    Culture ENTEROCOCCUS AVIUM (A)  Final   Report Status 01/15/2019 FINAL  Final   Organism ID, Bacteria ENTEROCOCCUS AVIUM  Final      Susceptibility   Enterococcus avium - MIC*    AMPICILLIN <=2 SENSITIVE Sensitive     VANCOMYCIN <=0.5 SENSITIVE Sensitive     GENTAMICIN SYNERGY SENSITIVE Sensitive     * ENTEROCOCCUS AVIUM  Culture, blood (routine x 2)     Status: None (Preliminary result)   Collection Time: 01/11/19  4:08 PM   Specimen: BLOOD RIGHT FOREARM  Result Value Ref Range Status   Specimen Description   Final    BLOOD RIGHT FOREARM Performed at [place] [address], [place], [place] [address]    Special Requests   Final    BOTTLES DRAWN AEROBIC AND ANAEROBIC Blood Culture adequate volume Performed at [place] [address] [place], [place] [address]    Culture   Final    NO GROWTH 3 DAYS Performed at [facility], [place] [address] [place], [place] [address]    Report Status PENDING  Incomplete   OPAT ORDERS:  Diagnosis: polymicrobial sepsis/fungemia  Culture Result: GCS, Strep anginosus, Candida glabrata, Bacteroides fragilis, and Enterococcus avium.  No Known Allergies  Discharge  antibiotics: Per pharmacy protocol meropenem 1 gm IV q 8 hrs AND caspofungin 50 mg IV daily  Duration/End Date: Meropenem until 02/22/2019 (6 weeks) Caspofungin until 03/08/2019 (8 weeks)   [facility] Care and Maintenance Per Protocol __ Please pull PIC at completion of IV antibiotics _X_ Please leave PIC in place until doctor has seen patient or been notified  Labs weekly while on IV antibiotics: _X_ CBC with differential __ BMP __ BMP TWICE WEEKLY** _X_ CMP __ CRP __ ESR __ Vancomycin trough  Fax weekly labs to [phone]  Clinic Follow Up Appt: 02/06/2019 at 9 AM @ [facility] with Dr.  Prince Rome  Impression: The patient is a 63 y/o WM with advanced pancreatic CA, known liver metastases, s/p bare metal biliary stenting who has had recurring admissions over the past 2 months with polymicrobial bloodstream infection.  He had group C strep bacteremia in early May treated with 2 weeks of ceftriaxone.  He was readmitted in mid-June with strep anginosis and Bacteroides bacteremia.  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  I suspect that his infections are coming from a hepatobiliary source (his tumor has been confirmed to have compressed his prior biliary stent closed disrupting the normal flora, allowing it to invade the vasculature, and requiring placement of a second bare metal stent to his biliary tract).  His PICC line was again removed on 01/11/2019 and would wait to insert a new PICC until blood cultures are negative after 72 hrs. Antimicrobial susceptibility testing of his Candida glabrata showed dose-dependent resistance to azoles. Anidulafungin was confirmed as sensitive. Based on current guidelines he should have an ophthalmologic exam sometime within the next 2 weeks to rule out endophthalmitis and help guide duration of antifungal therapy. Given confirmation of his tumor invasion as the cause of his recurrent bacteremias/fungemias, his risk for further recurrences remains quite high over time. His recent CDT was negative for Ag and toxin, making CDI unlikely. Suspect his watery diarrhea is more pancreatic in nature.  PLAN: 1. Continue meropenem and anidulafungin. As stated above, duration of treatment is unclear at this time while his repeat blood cx results are pending. 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 Given his underlying co-morbidities including active metastatic pancreatic CA, his oncologist and I agree he would not be a good surgical candidate. He was advised he will remain at a high risk to have his infection complicated by CVA even while on medical treatment. Hospice was advised but the patient's immediate goal is to go home and discuss with his extended family and visit with them, recognizing his infection is most likely terminal. He hopes to make a decision re: hospice in the next week or 2 if his medical condition allows. Tentative d/c date for meropenem will be 02/22/2019 and tentative anidulafungin d/c date will be 03/08/2019. > 20 minutes of time spent on d/c planning alone today. > 30 minutes of time spent discussing end-of-life issues with him as well. 2. Concern remains that his biliary stents will be the nidus of his infection but cannot be exchanged further. Once these infections become fungal in nature, efforts to prevent recurrence often fail unless the device can be removed or exchanged. 3. Removed PICC on 01/11/2019...delay placement of new PICC  after repeat blood cxs are negative at 48 hrs (hopefully will be the case by tomorrow AM). 4. Will need to discuss nature of chemo re-initiation with his oncologist as currently on hold due to his infection. If future chemo is immunosuppressive or likely to induce neutropenia, premature resumption of chemo could worsen prognosis (fungemia alone carries a 50% mortality risk). He seems unwilling to consider palliation at this time but did meet with the palliative care service over the weekend. I am unclear of his oncologic prognosis (expect this to also be quite poor), so will defer this discussion to them. I spoke with the patient and his wife on the phone on Friday. She seems to understand the gravity of his illness and high likelihood of imminent death, but the patient does not seem to accept his fate at this time. She expressly  asked about his mortality risks with infection and his CA treatment and understands that this will likely change his oncologic treatment and all-cause mortality.

## 2019-01-15 NOTE — Progress Notes (Signed)
Physical Therapy Treatment Patient Details Name: Cory Kelly MRN: [id number] DOB: 1955-11-01 Today's Date: 01/15/2019    History of Present Illness 63 y.o. male with medical history significant of HTN, pancreatic cancer, bacteremia. He presents w/ complaints of generalized weakness and falls. He reports multiple falls over the last several months. They generally do not involve chest pain, palpitations, dizziness, LOC. He reports that his most recent fall was 2 days prior to admission, but this was due to tripping over his dog. He says that he generally feels unsteady on his feet.    PT Comments    Pt participated well. He continues to report pain at rib fx site-worse at some times, better at other times. He tolerated activity well. He is unsteady but there were no overt losses of balance during session. Will continue to follow.     Follow Up Recommendations  (home with hospice per chart)     Equipment Recommendations       Recommendations for Other Services       Precautions / Restrictions Precautions Precautions: Fall Restrictions Weight Bearing Restrictions: No    Mobility  Bed Mobility Overal bed mobility: Modified Independent             General bed mobility comments: slow, painful, use of bedrails, HOB elevated  Transfers Overall transfer level: Needs assistance Equipment used: None Transfers: Sit to/from Stand Sit to Stand: Supervision         General transfer comment: for safety. slow transition.  Ambulation/Gait Ambulation/Gait assistance: Min guard Gait Distance (Feet): 300 Feet Assistive device: IV Pole Gait Pattern/deviations: Step-through pattern;Decreased stride length     General Gait Details: close guard for safety. unsteady at times.   Stairs             Wheelchair Mobility    Modified Rankin (Stroke Patients Only)       Balance                                            Cognition Arousal/Alertness:  Awake/alert Behavior During Therapy: WFL for tasks assessed/performed Overall Cognitive Status: Within Functional Limits for tasks assessed                                        Exercises      General Comments        Pertinent Vitals/Pain Pain Assessment: Faces Faces Pain Scale: Hurts little more Pain Location: pain with movement  - RLQ ribs - sharp Pain Descriptors / Indicators: Sore Pain Intervention(s): Monitored during session    Home Living                      Prior Function            PT Goals (current goals can now be found in the care plan section) Progress towards PT goals: Progressing toward goals    Frequency           PT Plan Current plan remains appropriate    Co-evaluation              AM-PAC PT "6 Clicks" Mobility   Outcome Measure  Help needed turning from your back to your side while in a flat bed without using bedrails?: None Help needed moving from  lying on your back to sitting on the side of a flat bed without using bedrails?: A Little Help needed moving to and from a bed to a chair (including a wheelchair)?: A Little Help needed standing up from a chair using your arms (e.g., wheelchair or bedside chair)?: A Little Help needed to walk in hospital room?: A Little Help needed climbing 3-5 steps with a railing? : A Little 6 Click Score: 19    End of Session Equipment Utilized During Treatment: Gait belt Activity Tolerance: Patient tolerated treatment well Patient left: in bed;with call bell/phone within reach;with family/visitor present   PT Visit Diagnosis: Unsteadiness on feet (R26.81);History of falling (Z91.81);Pain;Other abnormalities of gait and mobility (R26.89)     Time: 2800-3491 PT Time Calculation (min) (ACUTE ONLY): 11 min  Charges:  $Gait Training: 8-22 mins                       Weston Anna, PT Acute Rehabilitation Services Pager: [phone] Office: [phone]

## 2019-01-15 NOTE — Progress Notes (Addendum)
PHARMACY CONSULT NOTE FOR:  OUTPATIENT  PARENTERAL ANTIBIOTIC THERAPY (OPAT)  Indication: polymicrobial sepsis, candidemia Regimen: meropenem 1 gm IV q 8 hrs AND caspofungin 50 mg IV daily End date: Meropenem until 02/22/2019, Caspofungin until 03/08/2019 (updated 7/14 as per ID latest plans)  IV antibiotic discharge orders are pended. To discharging provider:  please sign these orders via discharge navigator,  Select New Orders & click on the button choice - Manage This Unsigned Work.     Thank you for allowing pharmacy to be a part of this patient's care.  Doreene Eland, PharmD, BCPS.   Work Cell: [phone] 01/15/2019 4:49 PM

## 2019-01-15 NOTE — Progress Notes (Addendum)
HEMATOLOGY-ONCOLOGY PROGRESS NOTE  SUBJECTIVE:  Frustrated that he has not been able to be discharged today. Feels like is continues to get bad news. Abdomen still feels full. Remains afebrile.   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[place])   08/31/2017 - 11/22/2017 Chemotherapy   The patient had palonosetron (ALOXI) injection 0.25 mg, 0.25 mg, Intravenous,  Once, 6 of 6 cycles Administration: 0.25 mg (08/31/2017), 0.25 mg (09/14/2017), 0.25 mg (09/28/2017), 0.25 mg (10/12/2017), 0.25 mg (10/26/2017), 0.25 mg (11/09/2017) pegfilgrastim-cbqv (UDENYCA) injection 6 mg, 6 mg, Subcutaneous, Once, 6 of 6 cycles Administration: 6 mg (09/02/2017), 6 mg (09/16/2017), 6 mg (09/30/2017), 6 mg (10/14/2017), 6 mg (10/28/2017), 6 mg (11/11/2017) irinotecan (CAMPTOSAR) 360 mg in sodium chloride 0.9 % 500 mL chemo infusion, 150 mg/m2 = 360 mg (100 % of original dose 150 mg/m2), Intravenous,  Once, 6 of 6 cycles Dose modification: 150 mg/m2 (original dose 150 mg/m2, Cycle 1, Reason: Provider Judgment) Administration: 360 mg (08/31/2017), 360 mg (09/14/2017), 360 mg (09/28/2017), 340 mg (10/12/2017), 340 mg (10/26/2017), 340 mg (11/09/2017) leucovorin 952 mg in sodium chloride 0.9 % 250 mL infusion, 400 mg/m2 = 952 mg, Intravenous,  Once, 6 of 6 cycles Administration: 952 mg (08/31/2017), 952 mg (09/14/2017), 952 mg (09/28/2017), 888 mg (10/12/2017), 888 mg (10/26/2017), 888 mg (11/09/2017) oxaliplatin (ELOXATIN) 200 mg in dextrose 5 % 500 mL chemo infusion, 85 mg/m2 = 200 mg, Intravenous,  Once, 6 of 6 cycles Dose modification: 65 mg/m2 (original dose 85 mg/m2, Cycle 4, Reason: Provider Judgment) Administration: 200 mg (08/31/2017), 200 mg (09/14/2017), 200 mg (09/28/2017), 145 mg (10/12/2017), 150 mg (10/26/2017), 150 mg (11/09/2017) fosaprepitant (EMEND) 150 mg, dexamethasone (DECADRON) 12 mg in sodium chloride 0.9 % 145 mL IVPB, , Intravenous,  Once, 6 of 6  cycles Administration:  (08/31/2017),  (09/14/2017),  (09/28/2017),  (10/12/2017),  (10/26/2017),  (11/09/2017) fluorouracil (ADRUCIL) 5,700 mg in sodium chloride 0.9 % 136 mL chemo infusion, 2,400 mg/m2 = 5,700 mg, Intravenous, 1 Day/Dose, 6 of 6 cycles Administration: 5,700 mg (08/31/2017), 5,700 mg (09/14/2017), 5,700 mg (09/28/2017), 5,350 mg (10/12/2017), 5,350 mg (10/26/2017), 5,350 mg (11/09/2017)  for chemotherapy treatment.    12/06/2017 - 02/28/2018 Chemotherapy   The patient had gemcitabine (GEMZAR) 1,786 mg in sodium chloride 0.9 % 250 mL chemo infusion, 800 mg/m2 = 1,786 mg (80 % of original dose 1,000 mg/m2), Intravenous,  Once, 4 of 4 cycles Dose modification: 800 mg/m2 (original dose 1,000 mg/m2, Cycle 1, Reason: Provider Judgment), 800 mg/m2 (original dose 1,000 mg/m2, Cycle 1, Reason: Provider Judgment, Comment: call from MD to decrease dose due to counts) Administration: 1,786 mg (12/20/2017), 1,786 mg (12/06/2017), 1,786 mg (01/03/2018), 1,786 mg (01/17/2018), 1,786 mg (01/31/2018), 1,786 mg (02/14/2018), 1,786 mg (02/28/2018) PACLitaxel-protein bound (ABRAXANE) chemo infusion 225 mg, 100 mg/m2 = 225 mg (80 % of original dose 125 mg/m2), Intravenous, Once, 3 of 3 cycles Dose modification: 100 mg/m2 (original dose 125 mg/m2, Cycle 1, Reason: Provider Judgment), 100 mg/m2 (original dose 125 mg/m2, Cycle 1, Reason: Provider Judgment, Comment: call from MD to decrease dose based on counts) Administration: 225 mg (12/20/2017), 225 mg (12/06/2017), 225 mg (01/03/2018), 225 mg (01/17/2018)  for chemotherapy treatment.    07/04/2018 Cancer Staging   Staging form: Exocrine Pancreas, AJCC 8th Edition - Pathologic: Stage IV (cM1) - Signed by Ladell Pier, MD on 07/04/2018   08/21/2018 -  Chemotherapy   The patient had palonosetron (ALOXI) injection 0.25  mg, 0.25 mg, Intravenous,  Once, 7 of 8 cycles Administration: 0.25 mg (08/21/2018), 0.25 mg (09/03/2018), 0.25 mg (09/18/2018), 0.25 mg (10/01/2018), 0.25 mg (10/16/2018),  0.25 mg (11/14/2018), 0.25 mg (12/10/2018) pegfilgrastim-cbqv (UDENYCA) injection 6 mg, 6 mg, Subcutaneous, Once, 7 of 8 cycles Administration: 6 mg (08/23/2018), 6 mg (09/05/2018), 6 mg (09/20/2018), 6 mg (10/03/2018), 6 mg (10/18/2018), 6 mg (11/16/2018) irinotecan (CAMPTOSAR) 360 mg in dextrose 5 % 500 mL chemo infusion, 150 mg/m2 = 360 mg (100 % of original dose 150 mg/m2), Intravenous,  Once, 7 of 8 cycles Dose modification: 150 mg/m2 (original dose 150 mg/m2, Cycle 1, Reason: Provider Judgment), 112.5 mg/m2 (original dose 150 mg/m2, Cycle 1, Reason: Change in LFTs), 90 mg/m2 (original dose 150 mg/m2, Cycle 6, Reason: Provider Judgment), 90 mg/m2 (original dose 90 mg/m2, Cycle 7) Administration: 260 mg (08/21/2018), 260 mg (09/03/2018), 260 mg (09/18/2018), 260 mg (10/01/2018), 200 mg (11/14/2018), 200 mg (10/16/2018), 200 mg (12/10/2018) leucovorin 952 mg in dextrose 5 % 250 mL infusion, 400 mg/m2 = 952 mg, Intravenous,  Once, 7 of 8 cycles Dose modification: 300 mg/m2 (original dose 400 mg/m2, Cycle 1, Reason: Provider Judgment), 300 mg/m2 (original dose 300 mg/m2, Cycle 7) Administration: 696 mg (08/21/2018), 696 mg (09/03/2018), 696 mg (09/18/2018), 676 mg (10/01/2018), 676 mg (11/14/2018), 676 mg (10/16/2018), 676 mg (12/10/2018) oxaliplatin (ELOXATIN) 155 mg in dextrose 5 % 500 mL chemo infusion, 65 mg/m2 = 155 mg (100 % of original dose 65 mg/m2), Intravenous,  Once, 7 of 8 cycles Dose modification: 65 mg/m2 (original dose 65 mg/m2, Cycle 1, Reason: Provider Judgment) Administration: 150 mg (08/21/2018), 150 mg (09/03/2018), 150 mg (09/18/2018), 150 mg (10/01/2018), 150 mg (10/16/2018), 150 mg (11/14/2018), 150 mg (12/10/2018) fosaprepitant (EMEND) 150 mg, dexamethasone (DECADRON) 12 mg in sodium chloride 0.9 % 145 mL IVPB, , Intravenous,  Once, 7 of 8 cycles Administration:  (08/21/2018),  (09/03/2018),  (09/18/2018),  (10/01/2018),  (10/16/2018),  (11/14/2018),  (12/10/2018) fluorouracil (ADRUCIL) 5,700 mg in sodium chloride 0.9 % 136  mL chemo infusion, 2,400 mg/m2 = 5,700 mg, Intravenous, 1 Day/Dose, 7 of 8 cycles Dose modification: 1,800 mg/m2 (original dose 2,400 mg/m2, Cycle 1, Reason: Change in LFTs) Administration: 4,200 mg (08/21/2018), 4,200 mg (09/03/2018), 4,200 mg (09/18/2018), 4,050 mg (10/01/2018), 4,050 mg (10/16/2018), 4,050 mg (11/14/2018), 4,050 mg (12/10/2018)  for chemotherapy treatment.      REVIEW OF SYSTEMS:   Constitutional: No recurrent fevers or chills. Respiratory: Denies cough, dyspnea or wheezes Cardiovascular: Denies palpitation, chest discomfort Gastrointestinal: Reports abdominal bloating.Denies nausea, vomiting, constipation, diarrhea. Skin: Denies abnormal skin rashes Lymphatics: Denies new lymphadenopathy or easy bruising Neurological:Denies numbness, tingling or new weaknesses Behavioral/Psych: Mood is stable, no new changes  Extremities: Reports lower extremity edema. All other systems were reviewed with the patient and are negative.  I have reviewed the past medical history, past surgical history, social history and family history with the patient and they are unchanged from previous note.   PHYSICAL EXAMINATION:  Vitals:   01/14/19 2032 01/15/19 0503  BP: 132/83 135/85  Pulse: 77 67  Resp: 17 17  Temp: 98.4 F (36.9 C) 98.5 F (36.9 C)  SpO2: 94% 94%   Filed Weights   01/10/19 1140  Weight: 195 lb 5.2 oz (88.6 kg)    Intake/Output from previous day: 07/13 0701 - 07/14 0700 In: 1380 [P.O.:1200; I.V.:80; IV Piggyback:100] Out: 401 [Urine:401]  GENERAL:alert, no distress EYES: scleral icterus noted OROPHARYNX: no thrush or mucositis NECK: supple, thyroid normal size, non-tender, without nodularity LYMPH:  no  palpable lymphadenopathy in the cervical, axillary or inguinal LUNGS: clear to auscultation and percussion with normal breathing effort HEART: regular rate & rhythm and no murmurs and trace lower extremity edema ABDOMEN: Positive bowel sounds. Moderate ascites noted.    Musculoskeletal:no cyanosis of digits and no clubbing  NEURO: alert & oriented x 3 with fluent speech, no focal motor/sensory deficits  LABORATORY DATA:  I have reviewed the data as listed CMP Latest Ref Rng & Units 01/15/2019 01/13/2019 01/12/2019  Glucose 70 - 99 mg/dL 122(H) 103(H) 117(H)  BUN 8 - 23 mg/dL 7(L) 10 11  Creatinine 0.61 - 1.24 mg/dL 0.55(L) 0.47(L) 0.49(L)  Sodium 135 - 145 mmol/L 132(L) 131(L) 132(L)  Potassium 3.5 - 5.1 mmol/L 4.1 3.9 3.9  Chloride 98 - 111 mmol/L 96(L) 96(L) 96(L)  CO2 22 - 32 mmol/L '25 28 28  '$ Calcium 8.9 - 10.3 mg/dL 8.0(L) 7.7(L) 7.6(L)  Total Protein 6.5 - 8.1 g/dL - - 4.4(L)  Total Bilirubin 0.3 - 1.2 mg/dL - - 5.0(H)  Alkaline Phos 38 - 126 U/L - - 711(H)  AST 15 - 41 U/L - - 53(H)  ALT 0 - 44 U/L - - 71(H)    Lab Results  Component Value Date   WBC 2.5 (L) 01/15/2019   HGB 9.2 (L) 01/15/2019   HCT 29.7 (L) 01/15/2019   MCV 91.1 01/15/2019   PLT 121 (L) 01/15/2019   NEUTROABS 2.0 01/15/2019    Dg Chest 2 View  Result Date: 12/28/2018 CLINICAL DATA:  Sepsis EXAM: CHEST - 2 VIEW COMPARISON:  December 24, 2018 FINDINGS: The right-sided Port-A-Cath has been removed. There is a new right-sided PICC line with tip terminating over the distal SVC. The cardiac size is stable. There is no pneumothorax. No large pleural effusion. The lung volumes are slightly low. IMPRESSION: No active cardiopulmonary disease. Electronically Signed   By: Constance Holster M.D.   On: 12/28/2018 18:53   Dg Ribs Unilateral W/chest Right  Result Date: 01/09/2019 CLINICAL DATA:  Fever, increasing ascites, fall several days prior, pain on right side chest. EXAM: RIGHT RIBS AND CHEST - 3+ VIEW COMPARISON:  Radiograph 01/06/2019, 12/28/2018 FINDINGS: Radiopaque marker placed at the level of the lateral right tenth rib, no subjacent fracture. No visible displaced rib fractures or other acute osseous abnormality. Degenerative changes in the spine and shoulders. Right upper  extremity PICC tip terminates at the superior cavoatrial junction. Surgical clips and TIPS catheter noted in the right upper quadrant. No acute soft tissue abnormality. Trace right pleural effusion and bandlike areas of subsegmental atelectasis the right lung base. Additional retrocardiac opacity is similar to prior exam. Lungs otherwise clear. Cardiomediastinal contours are unremarkable. IMPRESSION: No visible displaced rib fracture or other acute osseous abnormality. Right basilar and retrocardiac opacities could reflect atelectasis though developing consolidation could have a similar appearance given setting of fever. Trace right effusion. Electronically Signed   By: MD Lovena Le   On: 01/09/2019 16:15   Ct Abdomen Pelvis W Contrast  Result Date: 01/09/2019 CLINICAL DATA:  Fall, low back pain, pancreatic cancer with biliary stent placement EXAM: CT ABDOMEN AND PELVIS WITH CONTRAST TECHNIQUE: Multidetector CT imaging of the abdomen and pelvis was performed using the standard protocol following bolus administration of intravenous contrast. CONTRAST:  194m OMNIPAQUE IOHEXOL 300 MG/ML  SOLN COMPARISON:  12/24/2018 FINDINGS: Lower chest: Small to moderate bilateral pleural effusions and associated atelectasis or consolidation, new compared to prior examination. Coronary artery calcifications. Hepatobiliary: No solid liver abnormality is seen. Redemonstrated  common bile duct stent with slightly increased biliary ductal dilatation and redemonstrated post stenting pneumobilia. No change in appearance or configuration of stent. Pancreas: Primary pancreatic mass is not clearly appreciated on this examination, with atrophy and ductal dilatation of the pancreatic body and tail. Spleen: Splenomegaly, maximum coronal span 19.3 cm. Adrenals/Urinary Tract: Adrenal glands are unremarkable. Nonobstructive bilateral nephrolithiasis. No hydronephrosis. Bladder is unremarkable. Stomach/Bowel: Stomach is within normal limits.  Appendix appears normal. No evidence of bowel wall thickening, distention, or inflammatory changes. Vascular/Lymphatic: Aortic atherosclerosis. No enlarged abdominal or pelvic lymph nodes. Reproductive: No mass or other significant abnormality. Other: No abdominal wall hernia or abnormality. Moderate to large volume ascites, which is increased compared to prior examination. Anasarca. Musculoskeletal: There are mildly displaced fracture heads of the right tenth and eleventh ribs as well as the right transverse process of the T10 vertebral body. IMPRESSION: 1. There are mildly displaced fracture heads of the right tenth and eleventh ribs as well as the right transverse process of the T10 vertebral body. 2. Redemonstrated common bile duct stent with slightly increased biliary ductal dilatation and redemonstrated post stenting pneumobilia. No change in appearance or configuration of stent. Primary pancreatic mass is not clearly appreciated on this examination, with atrophy and ductal dilatation of the pancreatic body and tail. 3. Pleural effusions, ascites, and anasarca, new and increased compared to prior examination. Electronically Signed   By: Eddie Candle M.D.   On: 01/09/2019 16:52   Ct Abdomen Pelvis W Contrast  Result Date: 12/24/2018 CLINICAL DATA:  Abdominal distension, fever, history of pancreatic cancer EXAM: CT ABDOMEN AND PELVIS WITH CONTRAST TECHNIQUE: Multidetector CT imaging of the abdomen and pelvis was performed using the standard protocol following bolus administration of intravenous contrast. CONTRAST:  183m OMNIPAQUE IOHEXOL 300 MG/ML  SOLN COMPARISON:  MR abdomen pelvis, 12/04/2018, 10/30/2018 FINDINGS: Lower chest: No acute abnormality. Hepatobiliary: Known liver metastases are poorly appreciated by single-phase CT, for example in the dome of the liver (series 2, image 14). No gallstones or gallbladder wall thickening. Common bile duct stent is positioned tip in the descending portion of the  duodenum with post stenting pneumobilia. Pancreas: Discrete pancreatic head mass is not well appreciated, primarily appreciated by mass effect on the confluence of the portal vein (series 2, image 38. There is atrophy of the pancreatic body and tail and dilatation pancreatic duct. Spleen: Splenomegaly. Adrenals/Urinary Tract: Adrenal glands are unremarkable. Nonobstructive bilateral nephrolithiasis. Bladder is unremarkable. Stomach/Bowel: Stomach is within normal limits. Appendix is not clearly visualized. No evidence of bowel wall thickening, distention, or inflammatory changes. Large burden of stool in the distal colon and rectum. Vascular/Lymphatic: Aortic atherosclerosis. No enlarged abdominal or pelvic lymph nodes. Reproductive: No mass or other significant abnormality. Other: No abdominal wall hernia or abnormality. Moderate volume ascites. Musculoskeletal: No acute or significant osseous findings. IMPRESSION: 1. Redemonstrated pancreatic malignancy with hepatic metastases, status post common bile duct stenting. Primary mass and hepatic metastases are better appreciated by recent multiphasic MRI. 2.  Moderate volume ascites, which is similar to prior MRI. 3.  Splenomegaly. 4.  Large burden of stool in the distal colon and rectum. 5.  Nonobstructive bilateral nephrolithiasis. Electronically Signed   By: AEddie CandleM.D.   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 Request is made for diagnostic and therapeutic paracentesis. EXAM: ULTRASOUND GUIDED DIAGNOSTIC AND THERAPEUTIC PARACENTESIS MEDICATIONS: 10 mL 1% lidocaine COMPLICATIONS: None immediate. PROCEDURE: Informed  written consent was obtained from the patient after a discussion of the risks, benefits and alternatives to treatment. A timeout was performed prior to the initiation of the procedure. Initial ultrasound scanning demonstrates  a moderate amount of ascites within the right lower abdominal quadrant. The right lower abdomen was prepped and draped in the usual sterile fashion. 1% lidocaine was used for local anesthesia. Following this, a 19 gauge, 7-cm, Yueh catheter was introduced. An ultrasound image was saved for documentation purposes. The paracentesis was performed. The catheter was removed and a dressing was applied. The patient tolerated the procedure well without immediate post procedural complication. FINDINGS: A total of approximately 3.35 L of clear gold fluid was removed. Samples were sent to the laboratory as requested by the clinical team. IMPRESSION: Successful ultrasound-guided paracentesis yielding 3.35 L of peritoneal fluid. Read by: Earley Abide, PA-C Electronically Signed   By: Corrie Mckusick D.O.   On: 01/10/2019 16:46   US Paracentesis  Result Date: 12/31/2018 INDICATION: Patient with history of metastatic pancreatic cancer, ascites. Request made for diagnostic and therapeutic paracentesis. EXAM: ULTRASOUND GUIDED DIAGNOSTIC AND THERAPEUTIC PARACENTESIS MEDICATIONS: None COMPLICATIONS: None immediate. PROCEDURE: Informed written consent was obtained from the patient after a discussion of the risks, benefits and alternatives to treatment. A timeout was performed prior to the initiation of the procedure. Initial ultrasound scanning demonstrates a small to moderate amount of ascites within the right upper to mid abdominal quadrant. The right upper to mid abdomen was prepped and draped in the usual sterile fashion. 1% lidocaine was used for local anesthesia. Following this, a 19 gauge, 7-cm, Yueh catheter was introduced. An ultrasound image was saved for documentation purposes. The paracentesis was performed. The catheter was removed and a dressing was applied. The patient tolerated the procedure well without immediate post procedural complication. FINDINGS: A total of approximately 2.6 liters of slightly hazy, light  yellow fluid was removed. Samples were sent to the laboratory as requested by the clinical team. IMPRESSION: Successful ultrasound-guided diagnostic and therapeutic paracentesis yielding 2.6 liters of peritoneal fluid. Read by: Rowe Robert, PA-C Electronically Signed   By: Sandi Mariscal M.D.   On: 12/31/2018 12:04   Ir Removal [employer] W/ [place] W/o Fl Mod Sed  Result Date: 12/27/2018 CLINICAL DATA:  Bacteremia. Indwelling right IJ port catheter placed 08/30/2017 by Dr. Pascal Lux, has been working well without evident complication , removal requested EXAM: EXAM TUNNELED PORT CATHETER REMOVAL TECHNIQUE: The procedure, risks (including but not limited to bleeding, infection, organ damage ), benefits, and alternatives were explained to the patient. Questions regarding the procedure were encouraged and answered. The patient understands and consents to the procedure. Intravenous Fentanyl 149mg and Versed '4mg'$  were administered as conscious sedation during continuous monitoring of the patient's level of consciousness and physiological / cardiorespiratory status by the radiology RN, with a total moderate sedation time of 37 minutes. Overlying skin prepped with chlorhexidine, draped in usual sterile fashion, infiltrated locally with 1% lidocaine. A small incision was made over the scar from previous placement. The port catheter was dissected free from the underlying soft tissues and removed intact. There is no gross purulence. Hemostasis was achieved. The port pocket was closed with deep interrupted and subcuticular continuous 3-0 Monocryl sutures, then covered with Dermabond. The patient tolerated the procedure well. COMPLICATIONS: COMPLICATIONS None immediate IMPRESSION: 1.  Technically successful tunneled Port catheter removal. Electronically Signed   By: DLucrezia EuropeM.D.   On: 12/27/2018 16:14   Dg Chest Portable 1 View  Result Date: 01/06/2019 CLINICAL DATA:  Patient with fever EXAM: PORTABLE CHEST 1 VIEW COMPARISON:   Chest radiograph 12/28/2018 FINDINGS: Right upper extremity PICC line tip projects over the superior vena cava. Monitoring leads overlie the patient. Stable cardiac and mediastinal contours. Elevation of the right hemidiaphragm. Retrocardiac consolidative opacity. No pleural effusion or pneumothorax. IMPRESSION: Retrocardiac consolidative opacity which may represent atelectasis or pneumonia. Electronically Signed   By: Lovey Newcomer M.D.   On: 01/06/2019 21:02   Dg Chest Port 1 View  Result Date: 12/28/2018 CLINICAL DATA:  Sepsis hypertension EXAM: PORTABLE CHEST 1 VIEW COMPARISON:  12/28/2018, 12/24/2018 FINDINGS: Right upper extremity catheter tip overlies the SVC. No acute consolidation or effusion. Normal cardiomediastinal silhouette. No pneumothorax. IMPRESSION: No active disease. Electronically Signed   By: Donavan Foil M.D.   On: 12/28/2018 23:03   Dg Chest Portable 1 View  Result Date: 12/24/2018 CLINICAL DATA:  Fever and weakness. EXAM: PORTABLE CHEST 1 VIEW COMPARISON:  11/04/2018 FINDINGS: The cardiac silhouette, mediastinal and hilar contours are stable. Mild cardiac enlargement and mild tortuosity of the thoracic aorta. The right IJ power port is stable. The lungs are clear. No pleural effusions. The bony thorax is intact. IMPRESSION: No acute cardiopulmonary findings. Electronically Signed   By: Marijo Sanes M.D.   On: 12/24/2018 13:59   Dg Ercp  Result Date: 01/10/2019 CLINICAL DATA:  ERCP with biliary stent placement. EXAM: ERCP TECHNIQUE: Multiple spot images obtained with the fluoroscopic device and submitted for interpretation post-procedure. FLUOROSCOPY TIME:  3 minutes, 17 seconds (131 mGy) COMPARISON:  CT abdomen pelvis-01/09/2019 FINDINGS: 6 spot intraoperative fluoroscopic images of the right upper abdominal quadrant during ERCP are provided for review. Initial image demonstrates an ERCP probe overlying the right upper abdominal quadrant. Overlapping metallic internal biliary  stents overlie the expected location of the CBD. Surgical clips overlie the expected location of the pancreatic head. Subsequent images demonstrate selective cannulation and opacification of the biliary stents. There is suboptimal opacification the mid and distal aspects of the biliary stent, potentially secondary to subtotal occlusion. Subsequent images demonstrate presumed biliary sweeping and clearing of the biliary stents. There is faint opacification of the intrahepatic biliary tree which appears nondilated. IMPRESSION: ERCP with presumed internal biliary stent sleeping/clearing. These images were submitted for radiologic interpretation only. Please see the procedural report for the amount of contrast and the fluoroscopy time utilized. Electronically Signed   By: Sandi Mariscal M.D.   On: 01/10/2019 14:39   [place] Ascites (abdomen Limited)  Result Date: 12/25/2018 CLINICAL DATA:  64 year old male with abdominal distension, ascites EXAM: LIMITED ABDOMEN ULTRASOUND FOR ASCITES TECHNIQUE: Limited ultrasound survey for ascites was performed in all four abdominal quadrants. COMPARISON:  Prior CT scan of the abdomen and pelvis 12/24/2018 FINDINGS: Sonographic interrogation was performed of the 4 quadrants of the abdomen. There is trace perihepatic fluid in the right upper quadrant with multiple small loops of bowel. Overall, fluid volume is low. No safe window for paracentesis. IMPRESSION: Small volume ascites, insufficient for paracentesis. Electronically Signed   By: Jacqulynn Cadet M.D.   On: 12/25/2018 12:13   Ir Picc Placement Right >5 Yrs Inc Img Guide  Result Date: 12/27/2018 CLINICAL DATA:  Bacteremia, needs venous access for planned chemotherapy Monday EXAM: PICC PLACEMENT WITH ULTRASOUND AND FLUOROSCOPY FLUOROSCOPY TIME:  6 seconds, 1 mGy TECHNIQUE: After written informed consent was obtained, patient was placed in the supine position on angiographic table. Patency of the right basilic vein was confirmed  with ultrasound with image  documentation. An appropriate skin site was determined. Skin site was marked. Region was prepped using maximum barrier technique including cap and mask, sterile gown, sterile gloves, large sterile sheet, and Chlorhexidine as cutaneous antisepsis. The region was infiltrated locally with 1% lidocaine. Under real-time ultrasound guidance, the right basilic vein was accessed with a 21 gauge micropuncture needle; the needle tip within the vein was confirmed with ultrasound image documentation. Needle exchanged over a 018 guidewire for a peel-away sheath, through which a 5-French single-lumen power injectable PICC trimmed to 46cm was advanced, positioned with its tip near the cavoatrial junction. Spot chest radiograph confirms appropriate catheter position. Catheter was flushed per protocol and secured externally. The patient tolerated procedure well. COMPLICATIONS: COMPLICATIONS none IMPRESSION: 1. Technically successful five [place] single lumen power injectable PICC placement Electronically Signed   By: Lucrezia Europe M.D.   On: 12/27/2018 16:15   [place] Ekg Site Rite  Result Date: 01/04/2019 If Site Rite image not attached, placement could not be confirmed due to current cardiac rhythm.   ASSESSMENT AND PLAN: 1. Pancreas head mass  CT abdomen/pelvis 08/03/2017-fullness in the head of the pancreas with stranding in the peripancreatic fat and pancreatic ductal dilatation.   MRI abdomen 08/04/2017-findings favored to represent acute on chronic pancreatitis; equivocal soft tissue fullness within the head and uncinate process of the pancreas; indeterminate too small to characterize right hepatic lobe lesion; small volume abdominal ascites; splenomegaly.   CA-19-9 elevated at 977on 08/04/2017.  Status postupper EUS 08/10/2017-findings of an irregular masslike region in the pancreatic head measuring approximately 2.7 cm. This directly abutted the main portal vein but no other major vascular  structures. Biopsies obtained with preliminary cytology positive for malignancy, likely adenocarcinoma. The final report is pending. The common bile duct and main pancreatic duct were both dilated. ERCP was then proceeded with forstent placement. Multiple attempts were made tocannulate the bile duct without success. The procedure was aborted.  08/16/2017 ERCP with placement of a metal biliary stent in the common bile duct by Dr. Francella Solian at [facility].  Cycle 1 FOLFIRINOX 08/31/2017  Cycle 2 FOLFIRINOX 09/14/2017  Cycle 3 FOLFIRINOX 09/28/2017  Cycle 4 FOLFIRINOX 10/12/2017 (oxaliplatin dose reduced secondary to thrombocytopenia)  Cycle 5 FOLFIRINOX 10/26/2017  Cycle 6 FOLFIRINOX 11/09/2017  Restaging CTs at [facility] 11/29/2017-no definitive evidence of distant metastatic disease. 2 subcentimeter liver lesions described on prior MRI not visualized on CT. Ill-defined pancreatic head mass stable to decreased in size measuring 2.1 x 2 cm. Peripancreatic inflammatory stranding decreased from prior. No biliary ductal dilatation. Celiac axis less than 180 degrees abutment. Common hepatic artery with greater than 180 degrees encasement; superior mesenteric artery with short segment less than 180 degrees abutment; portal vein/superior mesenteric vein with greater than 180 degrees encasement of the extrahepatic portal vein with associated circumferential narrowing at the portomesenteric, overall substantially improved from prior examination. Now less than 180 degree abutment of the SMV. The portal vein and SMV remain patent. Splenic vein patent.  Cycle 1 gemcitabine/Abraxane 12/06/2017  Cycle 2 gemcitabine/Abraxane 12/20/2017  Cycle 3 gemcitabine/Abraxane 01/03/2018  Cycle 4 gemcitabine/Abraxane 01/17/2018  Cycle 5 gemcitabine 01/31/2018 (Abraxane held due to neuropathy)  Cycle 6 gemcitabine 02/14/2018 (Abraxane held due to neuropathy)  Cycle 7 gemcitabine 02/28/2018 (Abraxane held due to neuropathy)  CT  chest/abdomen/pelvis at [facility] 03/08/2018-stable appearance of previously identified infiltrating pancreatic head mass. No CT evidence of metastatic disease.  SBRT to the pancreas 04/05/2018-04/11/2018  05/09/2018 CA-19-9 2138   MRI abdomen 05/09/2018-similar 6 mm hypoenhancing focus posterior right  lobe liver; more superior lesion near the dome of the liver not identified on the postcontrast imaging but there is a persistent focus of diffusion signal abnormality in this region; new 8 mm hypoenhancing focus located just superior to the gallbladder; additional tiny focus of diffusion abnormality located more superiorly without obvious associated abnormal enhancement. Ill-defined pancreatic head mass not felt to be significantly changed.  CT chest/abdomen/pelvis 05/10/2018-ill-defined hypoattenuating pancreatic head mass and ill-defined soft tissue abutting the celiac axis with mildly increased dilatation of the main pancreatic duct. New soft tissue nodule along the right upper lobe bronchus; hypoattenuating liver lesions concerning for metastasis.  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 The stent traverses the pancreatic mass and extends into the duodenum. Increase in size of 2 liver lesions. Pancreatic mass appears similar. Indistinct tissue planes around the duodenum along with wall thickening in the gallbladder and especially the gallbladder neck.  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 Diffuse hepatic metastasis. Previous index lesions unchanged in size. Single new metastasis identified within the medial segment left lobe of liver. 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 Abdominal ascites increased in the interval.  Cycle 7 FOLFIRINOX6/02/2019 2. Hypertension 3. Port-A-Cath placement 08/30/2017 4. History of elevated bilirubin-questionGilbert's syndrome 5. Mild leukopenia, thrombocytopenia-question secondary to portal hypertension/splenomegaly;09/14/2017 white count improved, platelet count stable;progressive thrombocytopenia following gemcitabine/Abraxane 6. Kidney stones 7. Fever following cycles 2 and 3 gemcitabine/Abraxane,? Fever related to  gemcitabine 8.Jaundice, elevated liver enzymes and bilirubin 06/22/2018; status post ERCP 06/25/2018 with debris removed from the existing stent and a new plastic stent placed. Recurrent jaundice 07/19/2018 status post ERCP 07/20/2018 with finding that the previously placed plastic stent had migrated distally. There was debris and sludge on the stent. The previously placed stent was removed. 5 to 8 mm stricture just proximal to the existing SEMS. 2 plastic stents were placed.ERCP 08/16/2018-2 plastic biliary stents were removed from within the previously placed partially covered SEMS; stricture confirmed just proximal to the previously placed SEMS, treated with placement of an 8 cm long 10  mm diameter uncovered SEMS in good position. 9.Hospitalized with bacteremia Streptococcus group C 11/04/2018 through 11/08/2018. IV ceftriaxone through 11/17/2018. TEE 11/08/2018 negative. 10.Elevated LFTs 12/03/2018-MRI abdomen 12/04/2018 with mild increase in size of mass involving head of the pancreas. Diffuse hepatic metastasis. Previous index lesions unchanged in size. Single new metastasis identified within the medial segment left lobe of liver. 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 Abdominal ascites increased in the interval. 12/06/2018 ERCP-previously placed uncovered 8 cm long metal biliary stent nearly filled with bio debris and flecks of tissue ingrowth. Stent cleared. 11. 12/24/18 -admission with sepsis syndrome, blood culture from right arm positive for Streptococcus Anginosis, blood culture from Port-A-Cath with a Bacteroides species 12.  12/28/2018-readmission with sepsis syndrome 13. 01/09/2019 -readmission with hyperbilirubinemia- ERCP 01/10/2019 confirmed tumor ingrowth and debris in the bile duct stent, status post debridement of the stent and placement of a stent and stent  ASSESSMENT/PLAN: Mr. Sasso appears stable.  He underwent ERCP on 01/10/2019 which showed  extensive tumor ingrowth for disease progression.  He also had a paracentesis performed on 01/10/2019. Cytology did not show evidence of malignancy. BC from 7/10 show Enterococcus avium in aerobic bottle only.  Repeat blood cultures drawn on 01/14/2019 are pending.  On Meropenem. Remains on anidulafungin also. ID following.  Echocardiogram showed vegetation of the mitral valve.  He continues to have pancytopenia secondary to recent chemotherapy is overall stable.  1.  The patient has disease progression noted by the tumor ingrowth into his stent.  This is been discussed with him.  Cytology from paracentesis did not show any evidence of malignancy. He understands further chemotherapy may not be an option.  Palliative care team is following.  Recommendations are for the patient to discharge home with palliative care and eventual transition to hospice. 2.  BC from 7/10 show gram-positive cocci. IV antibiotics and IV antifungal per ID. Repeat blood cultures drawn on 01/14/2019 are pending. 3.  Spironolactone has been started for ascites. 4.  Continue to monitor CBC and transfuse as needed.  No transfusion is indicated today. 5.  Discontinue nonessential medications  We will plan to see Mr. Fakhouri as an outpatient and have an ongoing discussion regarding salvage systemic chemotherapy in the setting of recurrent polymicrobial sepsis and biliary obstruction.  We will arrange for outpatient follow-up once discharge date is determined.   LOS: 6 days   Mikey Bussing, DNP, AGPCNP-BC, AOCNP 01/15/19  Mr. Gerardo appears unchanged.  The echocardiogram findings are noted.  I discussed the case with Dr. Prince Rome.  He appears to have recurrent polymicrobial sepsis related to seeding from tumor and biliary obstruction.  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  I discussed the difficult situation with Mr. Roosevelt.  I also contacted his wife by telephone.  They understand he has both a malignancy and infection that are  incurable.  He is not a candidate for heart surgery.  There is no plan for further chemotherapy.  I recommend discharge to home with IV antibiotics, hoping this will provide several weeks of palliation and improved out of hospital quality of life.  He can then transition to home hospice care.  I discussed CPR and ACLS issues with Mr. Savage and his wife.  I recommended a No CODE BLUE status.  He and his wife are thinking about this option.  Discussed the case with the hospice medical director.  They will attempt to initiate hospice care while he is receiving home IV antibiotics.  I will plan to  follow him with the home hospice team.

## 2019-01-15 NOTE — Progress Notes (Signed)
[name] Kitchen  PROGRESS NOTE    Cory Kelly  GXQ:[id number] DOB: 1956/05/02 DOA: 01/09/2019 PCP: Mancel Bale, PA-C   Brief Narrative:   Cory Kelly a 63 y.o.malewith medical history significant ofHTN, pancreatic cancer, bacteremia. He presents today w/ complaints of generalized weakness and falls. He reports multiple falls over the last several months. They generally do not involve chest pain, palpitations, dizziness, LOC. He reports that his most recent fall was 2 days ago, but this was due to tripping over his dog. He says that he generally feels unsteady on his feet.   He was following up with his oncology team today (history of pancreatic cancer) and was found to have elevated LFTs/bilirubin. They became concerned and spoke with GI. He was sent to the ED and evaluated by the GI team. They believe he may have a problem with his biliary stent and take him for ERCP in the morning.  Of note, he has had multiple recent admissions. He was recently diagnosed w/ strep bacteremia and candidemia. He was placed on long term IV abx.   Assessment & Plan:   Active Problems:   Essential hypertension   Hyperlipemia   Elevated LFTs   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[place])   Streptococcal bacteremia   Candidemia ([place])   Falls   Bile duct obstruction   Falls - reports multiple falls over the last several months and a general feeling of being unsteady on his feet.  - most recent fall 2 nights ago; no LOC, head injury; Mechanism of fall was tripping over his moving dog - films negative for Fx -PT rec HHPT, he has declined  Elevated LFT/Hyperbilirubinemia - history of pancreatic CA w/ biliary stent -s/p ERCP: see GI note; stent occluded, stented the stent  Pancreatic CA - Follows with Dr. Benay Spice - s/p 7 cycles FOLFIRI - they note a 21lb weight increase since 12/24/2018 and were scheduling him for paracentesis; consider completion after ERCP  -onco onboard - poor prognosis; family would like to speak with palliative care, they have been consulted - now s/p paracentesis as well  Streptococcal bacteremia/Candidemia - currently on ertapenem through 01/26/2019 and capsofungin through 02/09/2019; continue - Heme/onc requesting review by ID, will speak with ID - appreciate ID assistance; abx transitioned  Enterococcus/Coag-neg staph bacteremia - labs drawn from [facility] - pt started on vanc ON w/ pharm consult - will speak with ID -Appreciate ID assistance; will need PICC holiday - repeat Bld Cx 1/2 positive for enterococcus; order rpt Bld Cx, check echo     - persistent bacteremia despite abx; w/ MV vegetation seen on echo; he's not an appropriate candidate for surgery     - have d/w onco and ID; ID to talk with onco  HTN - continue amlodipine, irbesartan  Palliative Care     - pt d/w Palliative Care his options; per their note his goal at this point is: "to get a PICC line placed, continue his IV antibiotics and to get home as soon as possible" with the plan to have palliative care follow outpt and transition to hospice     - ID to discuss with him about PICC/abx; without source control, IV abx is a treatment without end  Discussion with patient at bedside this morning about new findings and prognosis (Onco present for part of the conversation). Explained in detail the process of treatment under normal circumstances and why this would be inappropriate in his case. Given his wide range of comorbidities at this time,  I recommend hospice care.   DVT prophylaxis:heparin Code Status:FULL Disposition Plan:TBD   Consultants:   ID  GI  Oncology  Palliative Care  Procedures:   Paracentesis  ERCP  Antimicrobials:  [name] Kitchen Merrem, Eraxis    Subjective: "So what does this mean?"  Objective: Vitals:   01/14/19 0550 01/14/19 1411 01/14/19 2032 01/15/19 0503  BP: 136/83  (!) 143/89 132/83 135/85  Pulse: 69 76 77 67  Resp: 18 18 17 17   Temp: 98.3 F (36.8 C) 98.5 F (36.9 C) 98.4 F (36.9 C) 98.5 F (36.9 C)  TempSrc: Oral Oral    SpO2: 95% 98% 94% 94%  Weight:      Height:        Intake/Output Summary (Last 24 hours) at 01/15/2019 0940 Last data filed at 01/15/2019 0816 Gross per 24 hour  Intake 1260 ml  Output 401 ml  Net 859 ml   Filed Weights   01/10/19 1140  Weight: 88.6 kg    Examination:  General:62 y.o.maleresting in bed in NAD Cardiovascular: RRR, +S1, S2, no m/g/r, equal pulses throughout Respiratory: CTABL, no w/r/r, normal WOB GI: BS+,ND, TTP RUQ,no masses noted, no organomegaly noted MSK: No e/c/c Skin: No rashes, ulcerations noted; multiple bruises noted on b/l UE, he is jaundiced Neuro:alert to name follows commands Psyc: Appropriate interaction and affect, calm/cooperative   Data Reviewed: I have personally reviewed following labs and imaging studies.  CBC: Recent Labs  Lab 01/09/19 1223 01/10/19 0352 01/11/19 0430 01/12/19 0753 01/13/19 0635 01/15/19 0832  WBC 4.1 3.7* 2.5* 2.6* 2.0* 2.5*  NEUTROABS 3.6  --  2.0 2.1 1.5* 2.0  HGB 10.7* 8.7* 8.1* 8.1* 8.4* 9.2*  HCT 33.5* 27.9* 26.2* 25.4* 27.2* 29.7*  MCV 87.9 88.3 88.5 88.8 89.2 91.1  PLT 63* 59* 61* 69* 90* 915*   Basic Metabolic Panel: Recent Labs  Lab 01/09/19 1223 01/10/19 0352 01/11/19 0430 01/12/19 0753 01/13/19 0635 01/15/19 0832  NA 130* 130* 136 132* 131* 132*  K 4.0 4.2 4.1 3.9 3.9 4.1  CL 95* 95* 101 96* 96* 96*  CO2 27 26 28 28 28 25   GLUCOSE 110* 101* 115* 117* 103* 122*  BUN 7* 8 10 11 10  7*  CREATININE 0.67 0.40* 0.51* 0.49* 0.47* 0.55*  CALCIUM 8.0* 7.8* 7.8* 7.6* 7.7* 8.0*  MG 1.6* 1.8 2.0 2.0 1.9  --   PHOS  --   --  3.4  --  2.8 2.8   GFR: Estimated Creatinine Clearance: 121.5 mL/min (A) (by C-G formula based on SCr of 0.55 mg/dL (L)). Liver Function Tests: Recent Labs  Lab 01/09/19 1223 01/10/19 0352 01/11/19  0430 01/12/19 0753 01/13/19 0635 01/15/19 0832  AST 268* 171* 98* 53*  --   --   ALT 175* 133* 100* 71*  --   --   ALKPHOS 1,031* 778* 749* 711*  --   --   BILITOT 8.0* 9.0* 7.1* 5.0*  --   --   PROT 5.0* 4.9* 4.4* 4.4*  --   --   ALBUMIN 2.1* 2.1* 1.9* 1.8* 1.9* 2.2*   No results for input(s): LIPASE, AMYLASE in the last 168 hours. No results for input(s): AMMONIA in the last 168 hours. Coagulation Profile: Recent Labs  Lab 01/10/19 0352  INR 1.1   Cardiac Enzymes: No results for input(s): CKTOTAL, CKMB, CKMBINDEX, TROPONINI in the last 168 hours. BNP (last 3 results) No results for input(s): PROBNP in the last 8760 hours. HbA1C: No results for input(s): HGBA1C in the  last 72 hours. CBG: No results for input(s): GLUCAP in the last 168 hours. Lipid Profile: No results for input(s): CHOL, HDL, LDLCALC, TRIG, CHOLHDL, LDLDIRECT in the last 72 hours. Thyroid Function Tests: No results for input(s): TSH, T4TOTAL, FREET4, T3FREE, THYROIDAB in the last 72 hours. Anemia Panel: No results for input(s): VITAMINB12, FOLATE, FERRITIN, TIBC, IRON, RETICCTPCT in the last 72 hours. Sepsis Labs: No results for input(s): PROCALCITON, LATICACIDVEN in the last 168 hours.  Recent Results (from the past 240 hour(s))  Blood Culture (routine x 2)     Status: None   Collection Time: 01/06/19  8:41 PM   Specimen: BLOOD  Result Value Ref Range Status   Specimen Description   Final    BLOOD RIGHT WRIST Performed at [facility] [address] [place], [place] [address]    Special Requests   Final    BOTTLES DRAWN AEROBIC AND ANAEROBIC Blood Culture results may not be optimal due to an excessive volume of blood received in culture bottles Performed at [facility] [address], [place], [place] [address]    Culture   Final    NO GROWTH 5 DAYS Performed at [facility], [place] [address] [place], [place] [address]    Report Status 01/12/2019  FINAL  Final  Blood Culture (routine x 2)     Status: None   Collection Time: 01/06/19  8:46 PM   Specimen: BLOOD  Result Value Ref Range Status   Specimen Description   Final    BLOOD LEFT WRIST Performed at [facility] [address] [place], [place] [address]    Special Requests   Final    BOTTLES DRAWN AEROBIC AND ANAEROBIC Blood Culture results may not be optimal due to an excessive volume of blood received in culture bottles Performed at [facility] [address], [place], [place] [address]    Culture   Final    NO GROWTH 5 DAYS Performed at [facility], [place] [address] [place], [place] [address]    Report Status 01/12/2019 FINAL  Final  Culture, Blood     Status: None   Collection Time: 01/09/19 12:30 PM   Specimen: BLOOD LEFT HAND  Result Value Ref Range Status   Specimen Description   Final    BLOOD LEFT HAND Performed at [facility], [place] [address], [place], [place] [address]    Special Requests   Final    BOTTLES DRAWN AEROBIC AND ANAEROBIC Blood Culture results may not be optimal due to an inadequate volume of blood received in culture bottles   Culture   Final    NO GROWTH 5 DAYS Performed at [facility], [place] [address], [place], [place] [address]    Report Status 01/14/2019 FINAL  Final  Culture, Blood     Status: Abnormal   Collection Time: 01/09/19  1:13 PM   Specimen: BLOOD  Result Value Ref Range Status   Specimen Description BLOOD PICC LINE  Final   Special Requests   Final    BOTTLES DRAWN AEROBIC AND ANAEROBIC Blood Culture adequate volume   Culture  Setup Time   Final    GRAM POSITIVE COCCI IN BOTH AEROBIC AND ANAEROBIC BOTTLES CRITICAL RESULT CALLED TO, READ BACK BY AND VERIFIED WITH: PHRMD B GREEN @0529  01/10/19 BY S GEZAHEGN Performed at [facility], [place] [address], [place],  [address]    Culture (  A)  Final    ENTEROCOCCUS AVIUM STAPHYLOCOCCUS  SPECIES (COAGULASE NEGATIVE)    Report Status 01/12/2019 FINAL  Final   Organism ID, Bacteria ENTEROCOCCUS AVIUM  Final      Susceptibility   Enterococcus avium - MIC*    AMPICILLIN <=2 SENSITIVE Sensitive     VANCOMYCIN <=0.5 SENSITIVE Sensitive     GENTAMICIN SYNERGY SENSITIVE Sensitive     * ENTEROCOCCUS AVIUM  Blood Culture ID Panel (Reflexed)     Status: Abnormal   Collection Time: 01/09/19  1:13 PM  Result Value Ref Range Status   Enterococcus species DETECTED (A) NOT DETECTED Final    Comment: CRITICAL RESULT CALLED TO, READ BACK BY AND VERIFIED WITH: PHRMD B GREEN @0529  01/10/19 BY S GEZAHEGN    Vancomycin resistance NOT DETECTED NOT DETECTED Final   Listeria monocytogenes NOT DETECTED NOT DETECTED Final   Staphylococcus species DETECTED (A) NOT DETECTED Final    Comment: Methicillin (oxacillin) resistant coagulase negative staphylococcus. Possible blood culture contaminant (unless isolated from more than one blood culture draw or clinical case suggests pathogenicity). No antibiotic treatment is indicated for blood  culture contaminants. CRITICAL RESULT CALLED TO, READ BACK BY AND VERIFIED WITH: PHRMD B GREEN @0529  01/10/19 BY S GEZAHEGN    Staphylococcus aureus (BCID) NOT DETECTED NOT DETECTED Final   Methicillin resistance DETECTED (A) NOT DETECTED Final    Comment: CRITICAL RESULT CALLED TO, READ BACK BY AND VERIFIED WITH: PHRMD B GREEN @0529  01/10/19 BY S GEZAHEGN    Streptococcus species NOT DETECTED NOT DETECTED Final   Streptococcus agalactiae NOT DETECTED NOT DETECTED Final   Streptococcus pneumoniae NOT DETECTED NOT DETECTED Final   Streptococcus pyogenes NOT DETECTED NOT DETECTED Final   Acinetobacter baumannii NOT DETECTED NOT DETECTED Final   Enterobacteriaceae species NOT DETECTED NOT DETECTED Final   Enterobacter cloacae complex NOT DETECTED NOT DETECTED Final   Escherichia coli NOT DETECTED NOT DETECTED Final   Klebsiella oxytoca NOT DETECTED NOT DETECTED Final    Klebsiella pneumoniae NOT DETECTED NOT DETECTED Final   Proteus species NOT DETECTED NOT DETECTED Final   Serratia marcescens NOT DETECTED NOT DETECTED Final   Haemophilus influenzae NOT DETECTED NOT DETECTED Final   Neisseria meningitidis NOT DETECTED NOT DETECTED Final   Pseudomonas aeruginosa NOT DETECTED NOT DETECTED Final   Candida albicans NOT DETECTED NOT DETECTED Final   Candida glabrata NOT DETECTED NOT DETECTED Final   Candida krusei NOT DETECTED NOT DETECTED Final   Candida parapsilosis NOT DETECTED NOT DETECTED Final   Candida tropicalis NOT DETECTED NOT DETECTED Final    Comment: Performed at [facility], [place]. [address], [place], [place] [address]  SARS Coronavirus 2 (CEPHEID - Performed in [facility] hospital lab), Hosp Order     Status: None   Collection Time: 01/09/19  6:07 PM   Specimen: Nasopharyngeal Swab  Result Value Ref Range Status   SARS Coronavirus 2 NEGATIVE NEGATIVE Final    Comment: (NOTE) If result is NEGATIVE SARS-CoV-2 target nucleic acids are NOT DETECTED. The SARS-CoV-2 RNA is generally detectable in upper and lower  respiratory specimens during the acute phase of infection. The lowest  concentration of SARS-CoV-2 viral copies this assay can detect is 250  copies / mL. A negative result does not preclude SARS-CoV-2 infection  and should not be used as the sole basis for treatment or other  patient management decisions.  A negative result may occur with  improper specimen collection / handling, submission of specimen other  than nasopharyngeal swab, presence of viral mutation(s) within the  areas targeted by this assay, and inadequate number of viral copies  (<250 copies / mL). A negative result must be combined with clinical  observations, patient history, and epidemiological information. If result is POSITIVE SARS-CoV-2 target nucleic acids are DETECTED. The SARS-CoV-2 RNA is generally detectable in upper and lower  respiratory specimens  dur ing the acute phase of infection.  Positive  results are indicative of active infection with SARS-CoV-2.  Clinical  correlation with patient history and other diagnostic information is  necessary to determine patient infection status.  Positive results do  not rule out bacterial infection or co-infection with other viruses. If result is PRESUMPTIVE POSTIVE SARS-CoV-2 nucleic acids MAY BE PRESENT.   A presumptive positive result was obtained on the submitted specimen  and confirmed on repeat testing.  While 2019 novel coronavirus  (SARS-CoV-2) nucleic acids may be present in the submitted sample  additional confirmatory testing may be necessary for epidemiological  and / or clinical management purposes  to differentiate between  SARS-CoV-2 and other Sarbecovirus currently known to infect humans.  If clinically indicated additional testing with an alternate test  methodology [phone]) is advised. The SARS-CoV-2 RNA is generally  detectable in upper and lower respiratory sp ecimens during the acute  phase of infection. The expected result is Negative. Fact Sheet for Patients:  StrictlyIdeas.no Fact Sheet for Healthcare Providers: BankingDealers.co.za This test is not yet approved or cleared by the [place] FDA and has been authorized for detection and/or diagnosis of SARS-CoV-2 by FDA under an Emergency Use Authorization (EUA).  This EUA will remain in effect (meaning this test can be used) for the duration of the COVID-19 declaration under Section 564(b)(1) of the Act, 21 U.S.C. section 360bbb-3(b)(1), unless the authorization is terminated or revoked sooner. Performed at [facility], [place] [address], [place], [place] [address]   Body fluid culture     Status: None   Collection Time: 01/10/19  4:16 PM   Specimen: Peritoneal Fluid  Result Value Ref Range Status   Specimen Description   Final    PERITONEAL  Performed at [facility] [address] [place], [place] [address]    Special Requests   Final    NONE Performed at [facility], [place] [address], [place], [place] [address]    Gram Stain   Final    FEW WBC PRESENT,BOTH PMN AND MONONUCLEAR NO ORGANISMS SEEN    Culture   Final    NO GROWTH 3 DAYS Performed at [facility], [place] [address] [place], [place] [address]    Report Status 01/14/2019 FINAL  Final  Urine culture     Status: None   Collection Time: 01/11/19  4:50 AM   Specimen: Urine, Clean Catch  Result Value Ref Range Status   Specimen Description   Final    URINE, CLEAN CATCH Performed at [facility], [place] [address] [place], [place] [address]    Special Requests   Final    NONE Performed at [facility], [place] [address] [place], [place] [address]    Culture   Final    NO GROWTH Performed at [facility], [place] [address] [place], [place] [address]    Report Status 01/12/2019 FINAL  Final  Culture, blood (routine x 2)     Status: Abnormal   Collection Time: 01/11/19  4:08 PM   Specimen:  BLOOD RIGHT FOREARM  Result Value Ref Range Status   Specimen Description   Final    BLOOD RIGHT FOREARM Performed at [facility] [address] [place], [place] [address]    Special Requests   Final    BOTTLES DRAWN AEROBIC AND ANAEROBIC Blood Culture adequate volume Performed at [facility] [address] [place], [place] [address]    Culture  Setup Time   Final    GRAM POSITIVE COCCI AEROBIC BOTTLE ONLY CRITICAL VALUE NOTED.  VALUE IS CONSISTENT WITH PREVIOUSLY REPORTED AND CALLED VALUE. Performed at [facility], [place] [address], [place], [place] [address]    Culture ENTEROCOCCUS AVIUM (A)  Final   Report Status 01/15/2019 FINAL  Final   Organism ID, Bacteria ENTEROCOCCUS AVIUM  Final      Susceptibility   Enterococcus avium -  MIC*    AMPICILLIN <=2 SENSITIVE Sensitive     VANCOMYCIN <=0.5 SENSITIVE Sensitive     GENTAMICIN SYNERGY SENSITIVE Sensitive     * ENTEROCOCCUS AVIUM  Culture, blood (routine x 2)     Status: None (Preliminary result)   Collection Time: 01/11/19  4:08 PM   Specimen: BLOOD RIGHT FOREARM  Result Value Ref Range Status   Specimen Description   Final    BLOOD RIGHT FOREARM Performed at [facility] [address] [place], [place] [address]    Special Requests   Final    BOTTLES DRAWN AEROBIC AND ANAEROBIC Blood Culture adequate volume Performed at [facility] [address] [place], [place] [address]    Culture   Final    NO GROWTH 3 DAYS Performed at [facility], [place] [address] [place], [place] [address]    Report Status PENDING  Incomplete         Radiology Studies: No results found.      Scheduled Meds: . amLODipine  10 mg Oral QHS  . doxazosin  1 mg Oral QHS  . gabapentin  200 mg Oral BID  . heparin  5,000 Units Subcutaneous Q8H  . indomethacin  100 mg Rectal Once  . irbesartan  300 mg Oral Daily  . magnesium oxide  400 mg Oral BID  . potassium chloride SA  20 mEq Oral BID  . sodium chloride flush  10-40 mL Intracatheter Q12H  . spironolactone  100 mg Oral Daily   Continuous Infusions: . sodium chloride 10 mL/hr at 01/13/19 0400  . anidulafungin 100 mg (01/14/19 2050)  . lactated ringers 1,000 mL (01/10/19 1147)  . meropenem (MERREM) IV 1 g (01/15/19 0520)     LOS: 6 days    Time spent: 35 minutes spent in the coordination of care today.    Jonnie Finner, DO [facility] Pager [phone]  If 7PM-7AM, please contact night-coverage [email or web address] Password [facility] 01/15/2019, 9:40 AM

## 2019-01-15 NOTE — Progress Notes (Signed)
Met with Cory Kelly this evening to further discuss his goals of care. I had extensive discussion with [name] earlier today. Right now his goal is to get a PICC line placed, continue his IV antibiotics and to get home as soon as possible. He is aware of the 2D echo results and valvular vegetation.Will go home with palliative care following and transition to hospice.  Cory Hacker, DO Palliative Medicine

## 2019-01-15 NOTE — TOC Transition Note (Signed)
Transition of Care [facility]) - CM/SW Discharge Note   Patient Details  Name: Cory Kelly MRN: [id number] Date of Birth: 1955-09-03  Transition of [facility]) CM/SW Contact:  Lynnell Catalan, RN Phone Number: 01/15/2019, 3:11 PM   Clinical Narrative:    This CM spoke with liaison for [facility] for plan for home. [facility] was contacted by Oncology MD. [facility] medical director has approved pt to go home with hospice on IV abx for 2 weeks. This CM will work with [employer] to iron out all disposition plans. Hopeful for DC 7/15.    Readmission Risk Interventions Readmission Risk Prevention Plan 12/31/2018  Transportation Screening Complete  Medication Review [employer]) Complete  PCP or Specialist appointment within 3-5 days of discharge Complete  HRI or Home Care Consult Patient refused  SW Recovery Care/Counseling Consult Patient refused  Palliative Care Screening Not [place] Not Applicable  Some recent data might be hidden     Marney Doctor RN,BSN [phone]

## 2019-01-16 ENCOUNTER — Inpatient Hospital Stay: Payer: Self-pay

## 2019-01-16 DIAGNOSIS — A401 Sepsis due to streptococcus, group B: Secondary | ICD-10-CM | POA: Diagnosis not present

## 2019-01-16 DIAGNOSIS — S22009A Unspecified fracture of unspecified thoracic vertebra, initial encounter for closed fracture: Secondary | ICD-10-CM

## 2019-01-16 DIAGNOSIS — C259 Malignant neoplasm of pancreas, unspecified: Secondary | ICD-10-CM | POA: Diagnosis not present

## 2019-01-16 DIAGNOSIS — K831 Obstruction of bile duct: Secondary | ICD-10-CM

## 2019-01-16 DIAGNOSIS — S2241XA Multiple fractures of ribs, right side, initial encounter for closed fracture: Secondary | ICD-10-CM

## 2019-01-16 DIAGNOSIS — R7881 Bacteremia: Secondary | ICD-10-CM | POA: Diagnosis not present

## 2019-01-16 LAB — CULTURE, BLOOD (ROUTINE X 2)
Culture: NO GROWTH
Special Requests: ADEQUATE

## 2019-01-16 MED ORDER — SODIUM CHLORIDE 0.9% FLUSH: 10.0000 mL | INTRAVENOUS | Status: DC | PRN

## 2019-01-16 MED ORDER — SODIUM CHLORIDE 0.9% FLUSH: 10.0000 mL | Freq: Two times a day (BID) | INTRAVENOUS | Status: DC

## 2019-01-16 MED ORDER — HEPARIN SOD (PORK) LOCK FLUSH 100 UNIT/ML IV SOLN
250.0000 [IU] | INTRAVENOUS | Status: AC | PRN
  Administered 2019-01-16: 15:00:00 250 [IU]

## 2019-01-16 MED ORDER — CASPOFUNGIN IV (FOR PTA / DISCHARGE USE ONLY): 50.0000 mg | INTRAVENOUS | 0 refills | Status: DC

## 2019-01-16 MED ORDER — CASPOFUNGIN IV (FOR PTA / DISCHARGE USE ONLY): 50.0000 mg | INTRAVENOUS | 0 refills | Status: AC

## 2019-01-16 MED ORDER — MEROPENEM IV (FOR PTA / DISCHARGE USE ONLY): 1.0000 g | Freq: Three times a day (TID) | INTRAVENOUS | 0 refills | Status: AC

## 2019-01-16 NOTE — Progress Notes (Signed)
[facility]ious [facility]   Reason for visit: Follow up on polymicrobial sepsis  Antimicrobials: Meropenem, day 6 + 8 days of ertapenem Anidulafungin, day 14  Interval History: appetite remains poor, ambulation slightly better today than previous 2 days. Wife present at bedside as vascular techs prepare to place PICC line. Blood cxs, fever curve, labs, and medications all independently reviewed    Current Facility-Administered Medications:    0.9 %  sodium chloride infusion, , Intravenous, PRN, Marylyn Ishihara, Tyrone A, DO, Last Rate: 10 mL/hr at 01/13/19 0400   amLODipine (NORVASC) tablet 10 mg, 10 mg, Oral, QHS, Milus Banister, MD, 10 mg at 01/15/19 2145   anidulafungin (ERAXIS) 100 mg in sodium chloride 0.9 % 100 mL IVPB, 100 mg, Intravenous, Q24H, Milus Banister, MD, Last Rate: 78 mL/hr at 01/15/19 2148, 100 mg at 01/15/19 2148   diphenhydrAMINE (BENADRYL) capsule 25 mg, 25 mg, Oral, Q6H PRN, Lennox Grumbles, Belinda K, NP, 25 mg at 01/15/19 2145   doxazosin (CARDURA) tablet 1 mg, 1 mg, Oral, QHS, Milus Banister, MD, 1 mg at 01/15/19 2145   gabapentin (NEURONTIN) capsule 200 mg, 200 mg, Oral, BID, Milus Banister, MD, 200 mg at 01/16/19 4270   heparin injection 5,000 Units, 5,000 Units, Subcutaneous, Q8H, Milus Banister, MD, 5,000 Units at 01/16/19 1250   hydrocortisone (ANUSOL-HC) 2.5 % rectal cream 1 application, 1 application, Topical, QID PRN, Milus Banister, MD   indomethacin (INDOCIN) 50 MG suppository 100 mg, 100 mg, Rectal, Once, Milus Banister, MD   irbesartan Levy Sjogren) tablet 300 mg, 300 mg, Oral, Daily, Milus Banister, MD, 300 mg at 01/16/19 6237   lactated ringers infusion, , Intravenous, Continuous, Milus Banister, MD, Last Rate: 10 mL/hr at 01/10/19 1147   loperamide (IMODIUM) capsule 2-4 mg, 2-4 mg, Oral, TID PRN, Milus Banister, MD   magnesium oxide (MAG-OX) tablet 400 mg, 400 mg, Oral, BID, Milus Banister, MD, 400 mg at 01/16/19 0929    meropenem (MERREM) 1 g in sodium chloride 0.9 % 100 mL IVPB, 1 g, Intravenous, Q8H, Powers, Evern Core, MD, Last Rate: 200 mL/hr at 01/16/19 1250, 1 g at 01/16/19 1250   oxyCODONE-acetaminophen (PERCOCET/ROXICET) 5-325 MG per tablet 1 tablet, 1 tablet, Oral, Q4H PRN, 1 tablet at 01/16/19 1250 **AND** oxyCODONE (Oxy IR/ROXICODONE) immediate release tablet 5 mg, 5 mg, Oral, Q4H PRN, Milus Banister, MD, 5 mg at 01/16/19 1250   potassium chloride SA (K-DUR) CR tablet 20 mEq, 20 mEq, Oral, BID, Milus Banister, MD, 20 mEq at 01/16/19 6283   prochlorperazine (COMPAZINE) tablet 10 mg, 10 mg, Oral, Q6H PRN, Milus Banister, MD, 10 mg at 01/11/19 1250   sodium chloride flush (NS) 0.9 % injection 10-40 mL, 10-40 mL, Intracatheter, Q12H, Milus Banister, MD, 10 mL at 01/16/19 0929   sodium chloride flush (NS) 0.9 % injection 10-40 mL, 10-40 mL, Intracatheter, PRN, Milus Banister, MD, 10 mL at 01/11/19 2152   spironolactone (ALDACTONE) tablet 100 mg, 100 mg, Oral, Daily, Kyle, Tyrone A, DO, 100 mg at 01/16/19 0928   Physical Exam:   Vitals:   01/16/19 0441 01/16/19 1329  BP: 123/85 123/82  Pulse: 67 69  Resp: 16 17  Temp: 98.4 F (36.9 C) 98.3 F (36.8 C)  SpO2: 96% 96%   Physical Exam Gen: pleasant, NAD, A&Ox 3, jaundice improving Head: NCAT, no temporal wasting evident EENT: PERRL, EOMI, MMM, mild scleral icterus, adequate dentition Neck: supple, no  JVD CV: NRRR, II/VI SEM at LLSB Pulm: CTA bilaterally, no wheeze or retractions Abd: soft, mild RUQ TTP OTW NTND, +BS Extrems: trace LE edema, 2+ pulses Skin: no rashes, adequate skin turgor Neuro: CN II-XII grossly intact, no focal neurologic deficits appreciated, gait was not assessed, A&Ox 3   Review of Systems:  Review of Systems  Constitutional: Negative for chills, fever and weight loss.  HENT: Negative for congestion, hearing loss, sinus pain and sore throat.   Eyes: Negative for blurred vision, photophobia and discharge.    Respiratory: Negative for cough, hemoptysis and shortness of breath.   Cardiovascular: Negative for chest pain, palpitations, orthopnea and leg swelling.  Gastrointestinal: Positive for nausea. Negative for abdominal pain, constipation, diarrhea, heartburn and vomiting.  Genitourinary: Negative for dysuria, flank pain, frequency and urgency.  Musculoskeletal: Negative for back pain, joint pain and myalgias.  Skin: Positive for itching. Negative for rash.  Neurological: Negative for tremors, seizures, weakness and headaches.  Endo/Heme/Allergies: Negative for polydipsia. Does not bruise/bleed easily.  Psychiatric/Behavioral: Negative for depression and substance abuse. The patient is nervous/anxious and has insomnia.      Lab Results  Component Value Date   WBC 2.5 (L) 01/15/2019   HGB 9.2 (L) 01/15/2019   HCT 29.7 (L) 01/15/2019   MCV 91.1 01/15/2019   PLT 121 (L) 01/15/2019    Lab Results  Component Value Date   CREATININE 0.55 (L) 01/15/2019   BUN 7 (L) 01/15/2019   NA 132 (L) 01/15/2019   K 4.1 01/15/2019   CL 96 (L) 01/15/2019   CO2 25 01/15/2019    Lab Results  Component Value Date   ALT 71 (H) 01/12/2019   AST 53 (H) 01/12/2019   ALKPHOS 711 (H) 01/12/2019     Microbiology: Recent Results (from the past 240 hour(s))  Blood Culture (routine x 2)     Status: None   Collection Time: 01/06/19  8:41 PM   Specimen: BLOOD  Result Value Ref Range Status   Specimen Description   Final    BLOOD RIGHT WRIST Performed at [facility], [place] [address], [place], [place] [address]    Special Requests   Final    BOTTLES DRAWN AEROBIC AND ANAEROBIC Blood Culture results may not be optimal due to an excessive volume of blood received in culture bottles Performed at [place] [address], [place], [place] [address]    Culture   Final    NO GROWTH 5 DAYS Performed at [facility], [place] [address], [place], [place] [address]     Report Status 01/12/2019 FINAL  Final  Blood Culture (routine x 2)     Status: None   Collection Time: 01/06/19  8:46 PM   Specimen: BLOOD  Result Value Ref Range Status   Specimen Description   Final    BLOOD LEFT WRIST Performed at [place] [address], [place], [place] [address]    Special Requests   Final    BOTTLES DRAWN AEROBIC AND ANAEROBIC Blood Culture results may not be optimal due to an excessive volume of blood received in culture bottles Performed at [place] [address] [place], [place] [address]    Culture   Final    NO GROWTH 5 DAYS Performed at [facility], [place] [address], [place], [place] [address]    Report Status 01/12/2019 FINAL  Final  Culture, Blood     Status: None  Collection Time: 01/09/19 12:30 PM   Specimen: BLOOD LEFT HAND  Result Value Ref Range Status   Specimen Description   Final    BLOOD LEFT HAND Performed at [facility], [place] [address], [place], [place] [address]    Special Requests   Final    BOTTLES DRAWN AEROBIC AND ANAEROBIC Blood Culture results may not be optimal due to an inadequate volume of blood received in culture bottles   Culture   Final    NO GROWTH 5 DAYS Performed at [facility], [place] [address], [place], [place] [address]    Report Status 01/14/2019 FINAL  Final  Culture, Blood     Status: Abnormal   Collection Time: 01/09/19  1:13 PM   Specimen: BLOOD  Result Value Ref Range Status   Specimen Description BLOOD PICC LINE  Final   Special Requests   Final    BOTTLES DRAWN AEROBIC AND ANAEROBIC Blood Culture adequate volume   Culture  Setup Time   Final    GRAM POSITIVE COCCI IN BOTH AEROBIC AND ANAEROBIC BOTTLES CRITICAL RESULT CALLED TO, READ BACK BY AND VERIFIED WITH: PHRMD B GREEN _0  01/10/19 BY S GEZAHEGN Performed at [facility], [place] [address] [place], [place] [address]    Culture (A)  Final    ENTEROCOCCUS  AVIUM STAPHYLOCOCCUS SPECIES (COAGULASE NEGATIVE)    Report Status 01/12/2019 FINAL  Final   Organism ID, Bacteria ENTEROCOCCUS AVIUM  Final      Susceptibility   Enterococcus avium - MIC*    AMPICILLIN <=2 SENSITIVE Sensitive     VANCOMYCIN <=0.5 SENSITIVE Sensitive     GENTAMICIN SYNERGY SENSITIVE Sensitive     * ENTEROCOCCUS AVIUM  Blood Culture ID Panel (Reflexed)     Status: Abnormal   Collection Time: 01/09/19  1:13 PM  Result Value Ref Range Status   Enterococcus species DETECTED (A) NOT DETECTED Final    Comment: CRITICAL RESULT CALLED TO, READ BACK BY AND VERIFIED WITH: PHRMD B GREEN _1  01/10/19 BY S GEZAHEGN    Vancomycin resistance NOT DETECTED NOT DETECTED Final   Listeria monocytogenes NOT DETECTED NOT DETECTED Final   Staphylococcus species DETECTED (A) NOT DETECTED Final    Comment: Methicillin (oxacillin) resistant coagulase negative staphylococcus. Possible blood culture contaminant (unless isolated from more than one blood culture draw or clinical case suggests pathogenicity). No antibiotic treatment is indicated for blood  culture contaminants. CRITICAL RESULT CALLED TO, READ BACK BY AND VERIFIED WITH: PHRMD B GREEN _2  01/10/19 BY S GEZAHEGN    Staphylococcus aureus (BCID) NOT DETECTED NOT DETECTED Final   Methicillin resistance DETECTED (A) NOT DETECTED Final    Comment: CRITICAL RESULT CALLED TO, READ BACK BY AND VERIFIED WITH: PHRMD B GREEN _3  01/10/19 BY S GEZAHEGN    Streptococcus species NOT DETECTED NOT DETECTED Final   Streptococcus agalactiae NOT DETECTED NOT DETECTED Final   Streptococcus pneumoniae NOT DETECTED NOT DETECTED Final   Streptococcus pyogenes NOT DETECTED NOT DETECTED Final   Acinetobacter baumannii NOT DETECTED NOT DETECTED Final   Enterobacteriaceae species NOT DETECTED NOT DETECTED Final   Enterobacter cloacae complex NOT DETECTED NOT DETECTED Final   Escherichia coli NOT DETECTED NOT DETECTED Final   Klebsiella oxytoca NOT  DETECTED NOT DETECTED Final   Klebsiella pneumoniae NOT DETECTED NOT DETECTED Final   Proteus species NOT DETECTED NOT DETECTED Final   Serratia marcescens NOT DETECTED NOT DETECTED Final   Haemophilus influenzae NOT DETECTED NOT DETECTED Final  Neisseria meningitidis NOT DETECTED NOT DETECTED Final   Pseudomonas aeruginosa NOT DETECTED NOT DETECTED Final   Candida albicans NOT DETECTED NOT DETECTED Final   Candida glabrata NOT DETECTED NOT DETECTED Final   Candida krusei NOT DETECTED NOT DETECTED Final   Candida parapsilosis NOT DETECTED NOT DETECTED Final   Candida tropicalis NOT DETECTED NOT DETECTED Final    Comment: Performed at [facility], [place] [address], [place], [place] [address]  SARS Coronavirus 2 (CEPHEID - Performed in [place] [facility]), Hosp Order     Status: None   Collection Time: 01/09/19  6:07 PM   Specimen: Nasopharyngeal Swab  Result Value Ref Range Status   SARS Coronavirus 2 NEGATIVE NEGATIVE Final    Comment: (NOTE) If result is NEGATIVE SARS-CoV-2 target nucleic acids are NOT DETECTED. The SARS-CoV-2 RNA is generally detectable in upper and lower  respiratory specimens during the acute phase of infection. The lowest  concentration of SARS-CoV-2 viral copies this assay can detect is 250  copies / mL. A negative result does not preclude SARS-CoV-2 infection  and should not be used as the sole basis for treatment or other  patient management decisions.  A negative result may occur with  improper specimen collection / handling, submission of specimen other  than nasopharyngeal swab, presence of viral mutation(s) within the  areas targeted by this assay, and inadequate number of viral copies  (<250 copies / mL). A negative result must be combined with clinical  observations, patient history, and epidemiological information. If result is POSITIVE SARS-CoV-2 target nucleic acids are DETECTED. The SARS-CoV-2 RNA is generally detectable in upper and  lower  respiratory specimens dur ing the acute phase of infection.  Positive  results are indicative of active infection with SARS-CoV-2.  Clinical  correlation with patient history and other diagnostic information is  necessary to determine patient infection status.  Positive results do  not rule out bacterial infection or co-infection with other viruses. If result is PRESUMPTIVE POSTIVE SARS-CoV-2 nucleic acids MAY BE PRESENT.   A presumptive positive result was obtained on the submitted specimen  and confirmed on repeat testing.  While 2019 novel coronavirus  (SARS-CoV-2) nucleic acids may be present in the submitted sample  additional confirmatory testing may be necessary for epidemiological  and / or clinical management purposes  to differentiate between  SARS-CoV-2 and other Sarbecovirus currently known to infect humans.  If clinically indicated additional testing with an alternate test  methodology [phone]) is advised. The SARS-CoV-2 RNA is generally  detectable in upper and lower respiratory sp ecimens during the acute  phase of infection. The expected result is Negative. Fact Sheet for Patients:  StrictlyIdeas.no Fact Sheet for Healthcare Providers: BankingDealers.co.za This test is not yet approved or cleared by the [place] FDA and has been authorized for detection and/or diagnosis of SARS-CoV-2 by FDA under an Emergency Use Authorization (EUA).  This EUA will remain in effect (meaning this test can be used) for the duration of the COVID-19 declaration under Section 564(b)(1) of the Act, 21 U.S.C. section 360bbb-3(b)(1), unless the authorization is terminated or revoked sooner. Performed at [facility], [place] [address] [place], [place] [address]   Body fluid culture     Status: None   Collection Time: 01/10/19  4:16 PM   Specimen: Peritoneal Fluid  Result Value Ref Range Status   Specimen  Description   Final    PERITONEAL Performed at [facility]  [place], [place] [address]    Special Requests   Final    NONE Performed at [facility], [place] [address], [place], [place] [address]    Gram Stain   Final    FEW WBC PRESENT,BOTH PMN AND MONONUCLEAR NO ORGANISMS SEEN    Culture   Final    NO GROWTH 3 DAYS Performed at [facility], [place] [address], [place], [place] [address]    Report Status 01/14/2019 FINAL  Final  Urine culture     Status: None   Collection Time: 01/11/19  4:50 AM   Specimen: Urine, Clean Catch  Result Value Ref Range Status   Specimen Description   Final    URINE, CLEAN CATCH Performed at [facility], [place] [address], [place], [place] [address]    Special Requests   Final    NONE Performed at [facility], [place] [address] [place], [place] [address]    Culture   Final    NO GROWTH Performed at [facility], [place] [address] [place] [address] [place], [place] [address]    Report Status 01/12/2019 FINAL  Final  Culture, blood (routine x 2)     Status: Abnormal   Collection Time: 01/11/19  4:08 PM   Specimen: BLOOD RIGHT FOREARM  Result Value Ref Range Status   Specimen Description   Final    BLOOD RIGHT FOREARM Performed at [place] [address], [place], [place] [address]    Special Requests   Final    BOTTLES DRAWN AEROBIC AND ANAEROBIC Blood Culture adequate volume Performed at [place] [address] [place], [place] [address]    Culture  Setup Time   Final    GRAM POSITIVE COCCI AEROBIC BOTTLE ONLY CRITICAL VALUE NOTED.  VALUE IS CONSISTENT WITH PREVIOUSLY REPORTED AND CALLED VALUE. Performed at [facility], [place] [address], [place], [place] [address]    Culture ENTEROCOCCUS AVIUM (A)  Final   Report Status 01/15/2019 FINAL  Final   Organism ID, Bacteria ENTEROCOCCUS AVIUM  Final       Susceptibility   Enterococcus avium - MIC*    AMPICILLIN <=2 SENSITIVE Sensitive     VANCOMYCIN <=0.5 SENSITIVE Sensitive     GENTAMICIN SYNERGY SENSITIVE Sensitive     * ENTEROCOCCUS AVIUM  Culture, blood (routine x 2)     Status: None   Collection Time: 01/11/19  4:08 PM   Specimen: BLOOD RIGHT FOREARM  Result Value Ref Range Status   Specimen Description   Final    BLOOD RIGHT FOREARM Performed at [place] [address] [place], [place] [address]    Special Requests   Final    BOTTLES DRAWN AEROBIC AND ANAEROBIC Blood Culture adequate volume Performed at [place] [address], [place], [place] [address]    Culture   Final    NO GROWTH 5 DAYS Performed at [facility], [place] [address], [place], [place] [address]    Report Status 01/16/2019 FINAL  Final  Culture, blood (routine x 2)     Status: None (Preliminary result)   Collection Time: 01/14/19  1:44 PM   Specimen: BLOOD LEFT HAND  Result Value Ref Range Status   Specimen Description   Final    BLOOD LEFT HAND Performed at [place] [address] [place],  [address]    Special Requests   Final  BOTTLES DRAWN AEROBIC ONLY Blood Culture adequate volume Performed at [place] [address], [place], [place] [address]    Culture   Final    NO GROWTH 2 DAYS Performed at [place] [address] [place], [place] [address]    Report Status PENDING  Incomplete  Culture, blood (routine x 2)     Status: None (Preliminary result)   Collection Time: 01/14/19  1:48 PM   Specimen: Right Antecubital; Blood  Result Value Ref Range Status   Specimen Description   Final    RIGHT ANTECUBITAL Performed at [place] [address] [place], [place] [address]    Special Requests   Final    BOTTLES DRAWN AEROBIC AND ANAEROBIC Blood Culture adequate volume Performed at [place] [address], [place], [place] [address]    Culture   Final    NO GROWTH 2 DAYS Performed at [place] [address], [place], [place] [address]    Report Status PENDING  Incomplete   OPAT ORDERS:  Diagnosis: polymicrobial sepsis/fungemia  Culture Result: GCS, Strep anginosus, Candida glabrata, Bacteroides fragilis, and Enterococcus avium.  No Known Allergies  Discharge antibiotics: Per pharmacy protocol meropenem 1 gm IV q 8 hrs AND caspofungin 50 mg IV daily  Duration/End Date: Meropenem until 02/22/2019 (6 weeks) Caspofungin until 03/08/2019 (8 weeks)   PIC Care and Maintenance Per Protocol __ Please pull PIC at completion of IV antibiotics _X_ Please leave PIC in place until doctor has seen patient or been notified  Labs weekly while on IV antibiotics: _X_ CBC with differential __ BMP __ BMP TWICE WEEKLY** _X_ CMP __ CRP __ ESR __ Vancomycin trough  Fax weekly labs to [phone]  Clinic Follow Up Appt: 02/06/2019 at 9 AM @ [facility] with Dr. Prince [place]  Impression: The patient is a 63 y/o WM with advanced pancreatic CA, known liver metastases, s/p bare metal biliary stenting who has had recurring admissions over the past 2 months with polymicrobial bloodstream infection.  He had group C strep bacteremia in early May treated with 2 weeks of ceftriaxone.  He was readmitted in mid-June with strep anginosis and Bacteroides bacteremia.  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  I suspect that his infections are coming from a hepatobiliary source (his tumor has been confirmed to have compressed his prior biliary stent closed disrupting the normal flora, allowing it to invade the vasculature, and requiring placement of a second bare metal stent to his biliary tract).  His PICC line was again removed on 01/11/2019 and would wait to insert a new PICC until blood cultures are negative after 72 hrs.  Antimicrobial susceptibility testing of his Candida glabrata showed dose-dependent resistance to azoles. Anidulafungin was confirmed as sensitive. Based on current guidelines he should have an ophthalmologic exam sometime within the next 2 weeks to rule out endophthalmitis and help guide duration of antifungal therapy. Given confirmation of his tumor invasion as the cause of his recurrent bacteremias/fungemias, his risk for further recurrences remains quite high over time. His recent CDT was negative for Ag and toxin, making CDI unlikely. Suspect his watery diarrhea is more pancreatic in nature.  PLAN: 1. Continue meropenem and anidulafungin. As stated above, duration of treatment is unclear at this time while his repeat blood cx results are pending. 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 Given his underlying co-morbidities including active metastatic pancreatic CA, his oncologist and I agree he would not be a good surgical candidate. He was advised he will remain at a high risk to have his infection complicated by CVA even while on medical treatment. Hospice was advised but the patient's immediate goal is to go home and discuss with his extended family and visit with them, recognizing his infection is most likely terminal. He hopes to make a decision re: hospice in the next week or 2 if his medical condition allows. Tentative d/c date for meropenem will be 02/22/2019 and tentative anidulafungin d/c date will be 03/08/2019. I had a prolonged conversation with his wife outside the patient's room while he was having his PICC line placed to ensure she was aware of his prognosis from an ID standpoint and encouraged her to discuss his code status and end-of-life wishes with him and  his family once he returns home as a DNR and possibly hospice may be his best treatment options moving forward.  2. Concern remains that his biliary stents will be the nidus of his infection but cannot be exchanged further. Once these infections become fungal in nature, efforts to prevent recurrence often fail unless the device can be removed or exchanged. 3. Removed PICC on 01/11/2019...new PICC to be placed today (techs now in room preparing for procedure). 4. Uncertain if chemo re-initiation will ever be a safe possibility given his current infection. If future chemo is immunosuppressive or likely to induce neutropenia, premature resumption of chemo could worsen prognosis (fungemia alone carries a 50% mortality risk and fungal endocarditis carries a mortality rate of 70% without surgical intervention). He seems unwilling to consider palliation at this time but did meet with the palliative care service over the weekend. I am unclear of his oncologic prognosis (expect this to also be quite poor), so will defer this discussion to them. I spoke with the patient and his wife in detail today. She seems to understand the gravity of his illness and high likelihood of imminent death, but the patient does not seem to accept his fate at this time. She expressly asked about his mortality risks with infection and his CA treatment and understands that this will likely change his oncologic treatment and all-cause mortality.

## 2019-01-16 NOTE — Consult Note (Signed)
   [facility] CM Inpatient Consult   01/16/2019  Cory Kelly May 16, 1956 [id number]   Patient chart has been reviewed for readmissions less than 30 days and for high risk score, 34%, for unplanned readmissions.  Patient assessed for community [name] Management follow up.   Chart review reveals current disposition plan is for home hospice.  No [facility] Care Management needs.   Netta Cedars, MSN, [facility] Liaison Nurse Mobile Phone [phone]  Toll free office [phone]

## 2019-01-16 NOTE — Discharge Summary (Signed)
Physician Discharge Summary  Cory Kelly OAC:[id number] DOB: [birth year] DOA: 01/09/2019  PCP: Mancel Bale, PA-C  Admit date: 01/09/2019 Discharge date: 01/16/2019  Admitted From: Home Disposition:  Home  Recommendations for Outpatient Follow-up:  1. Follow up with PCP in 1-2 weeks 2. Follow up with hospice services 3. Continue routine PICC care. Please d/c PICC after completion of abx on 03/08/19 4. Complete meropenem on 02/22/19 5. Complete Caspofungin on 03/08/19  Discharge Condition:Stable CODE STATUS:Full Diet recommendation: Regular as tolerated   Brief/Interim Summary: 62 y.o.malewith medical history significant ofHTN, pancreatic cancer, bacteremia. He presents today w/ complaints of generalized weakness and falls. He reports multiple falls over the last several months. They generally do not involve chest pain, palpitations, dizziness, LOC. He reports that his most recent fall was 2 days ago, but this was due to tripping over his dog. He says that he generally feels unsteady on his feet.   He was following up with his oncology team today (history of pancreatic cancer) and was found to have elevated LFTs/bilirubin. They became concerned and spoke with GI. He was sent to the ED and evaluated by the GI team. They believe he may have a problem with his biliary stent and take him for ERCP in the morning.  Of note, he has had multiple recent admissions. He was recently diagnosed w/ strep bacteremia and candidemia. He was placed on long term IV abx.  Discharge Diagnoses:  Active Problems:   Essential hypertension   Hyperlipemia   Elevated LFTs   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[place])   Streptococcal bacteremia   Candidemia ([place])   Falls   Bile duct obstruction  Falls - reports multiple falls over the last several months and a general feeling of being unsteady on his feet.  - most recent fall 2 nights ago; no LOC, head injury; Mechanism of fall was tripping over his  moving dog - films negative for Fx -PT rec HHPT, he has declined  Elevated LFT/Hyperbilirubinemia - history of pancreatic CA w/ biliary stent -s/p ERCP: see GI note; stent occluded, stented the stent  Pancreatic CA - Follows with Dr. Benay Spice - s/p 7 cycles FOLFIRI - they note a 21lb weight increase since 12/24/2018 and were scheduling him for paracentesis; consider completion after ERCP -onco had been following - now s/p paracentesis as well     - Palliative Care following. Poor prognosis. Plan to complete course of IV abx and continue with hospice at home. Discussed with SW  Streptococcal bacteremia/Candidemia - currently on ertapenem through 01/26/2019 and capsofungin through 02/09/2019; continue - appreciate ID assistance; abx transitioned  Enterococcus/Coag-neg staph bacteremia - labs drawn from pic[facility] - pt started on vanc ON w/ pharm consult -Appreciate ID assistance -repeat Bld Cx 1/2 positive for enterococcus; order rpt Bld Cx, check echo     - persistent bacteremia despite abx; w/ MV vegetation seen on echo; he's not an appropriate candidate for surgery     - have d/w onco and ID; ID to talk with onco     - New PICC placed on 7/15 for home abx  HTN - continue amlodipine, irbesartan  Palliative Care     - pt d/w Palliative Care his options; per their note his goal at this point is: "to get a PICC line placed, continue his IV antibiotics and to get home as soon as possible" with the plan to have palliative care follow outpt and transition to hospice  Discharge Instructions  Discharge Instructions  Home infusion instructions [facility] May follow [place] Dosing Protocol; May administer Cathflo as needed to maintain patency of vascular access device.; Flushing of vascular access device: per [facility] [place] Protocol: 0.9% NaCl pre/post medica...   Complete by: As directed    Instructions: May follow  [place] Dosing Protocol   Instructions: May administer Cathflo as needed to maintain patency of vascular access device.   Instructions: Flushing of vascular access device: per [facility] Protocol: 0.9% NaCl pre/post medication administration and prn patency; Heparin 100 u/ml, 70ml for implanted ports and Heparin 10u/ml, 81ml for all other central venous catheters.   Instructions: May follow [facility] Anaphylaxis Protocol for First Dose Administration in the home: 0.9% NaCl at 25-50 ml/hr to maintain IV access for protocol meds. Epinephrine 0.3 ml IV/IM PRN and Benadryl 25-50 IV/IM PRN s/s of anaphylaxis.   Instructions: [place] Infusion Coordinator (RN) to assist per patient IV care needs in the home PRN.     Allergies as of 01/16/2019   No Known Allergies     Medication List    STOP taking these medications   ertapenem  IVPB Commonly known as: INVANZ     TAKE these medications   Align 4 MG Caps Take 1 capsule by mouth daily.   amLODipine 10 MG tablet Commonly known as: NORVASC Take 10 mg by mouth at bedtime.   caspofungin  IVPB Commonly known as: CANCIDAS Inject 50 mg into the vein daily. Indication:  Candidemia Last Day of Therapy:  03/08/2019 Labs - Once weekly:  CBC/D and CMP What changed: additional instructions   doxazosin 1 MG tablet Commonly known as: CARDURA Take 1 mg by mouth at bedtime.   ferrous sulfate 325 (65 FE) MG tablet Take 1 tablet (325 mg total) by mouth 2 (two) times daily with a meal.   gabapentin 100 MG capsule Commonly known as: Neurontin Take 2 capsules (200 mg total) by mouth 2 (two) times daily.   guaiFENesin-dextromethorphan 100-10 MG/5ML syrup Commonly known as: ROBITUSSIN DM Take 5 mLs by mouth every 4 (four) hours as needed for cough (chest congestion).   hydrocortisone 2.5 % rectal cream Commonly known as: ANUSOL-HC Apply 1 application topically 4 (four) times daily as needed for hemorrhoids.   lidocaine-prilocaine cream Commonly  known as: EMLA Apply 1 application topically as needed. Apply to portacath site 1-2 hours prior to use What changed:   when to take this  reasons to take this  additional instructions   loperamide 2 MG tablet Commonly known as: Imodium A-D Take 1 tablet (2 mg total) by mouth as directed. What changed:   how much to take  when to take this  reasons to take this   meropenem  IVPB Commonly known as: MERREM Inject 1 g into the vein every 8 (eight) hours. Indication:  Endocarditis and polymicrobial sepsis- enterococcus, bacteroides, grp C strep, S. anginosis Last Day of Therapy:  02/22/2019 Labs - Once weekly:  CBC/D and CMP   olmesartan 40 MG tablet Commonly known as: Benicar Take 1 tablet (40 mg total) by mouth daily.   oxyCODONE-acetaminophen 10-325 MG tablet Commonly known as: Percocet Take 1 tablet by mouth every 4 (four) hours as needed for pain.   potassium chloride SA 20 MEQ tablet Commonly known as: K-DUR Take 1 tablet (20 mEq total) by mouth 2 (two) times daily.   PRAMOXINE-ZINC ACETATE EX Apply 1 application topically daily as needed (itching).   prochlorperazine 10 MG tablet Commonly known as: COMPAZINE Take 1  tablet (10 mg total) by mouth every 6 (six) hours as needed for nausea or vomiting.   tamsulosin 0.4 MG Caps capsule Commonly known as: FLOMAX Take 1 capsule (0.4 mg total) by mouth daily after supper.            Home Infusion Instuctions  (From admission, onward)         Start     Ordered   01/16/19 0000  Home infusion instructions [facility] May follow [place] Dosing Protocol; May administer Cathflo as needed to maintain patency of vascular access device.; Flushing of vascular access device: per [facility] Protocol: 0.9% NaCl pre/post medica...    Question Answer Comment  Instructions May follow [place] Dosing Protocol   Instructions May administer Cathflo as needed to maintain patency of vascular access device.   Instructions  Flushing of vascular access device: per  [facility] Protocol: 0.9% NaCl pre/post medication administration and prn patency; Heparin 100 u/ml, 58ml for implanted ports and Heparin 10u/ml, 6ml for all other central venous catheters.   Instructions May follow [facility] Anaphylaxis Protocol for First Dose Administration in the home: 0.9% NaCl at 25-50 ml/hr to maintain IV access for protocol meds. Epinephrine 0.3 ml IV/IM PRN and Benadryl 25-50 IV/IM PRN s/s of anaphylaxis.   Instructions [facility] Infusion Coordinator (RN) to assist per patient IV care needs in the home PRN.      01/16/19 1154         Follow-up Information    Weber, Sarah L, PA-C Follow up.   Specialty: Physician Assistant Why: Follow up as needed Contact information: [address] [place] [address] [phone]        Follow up with hospice services Follow up.          No Known Allergies  Consultations:  Oncology  ID  Procedures/Studies: Dg Chest 2 View  Result Date: 12/28/2018 CLINICAL DATA:  Sepsis EXAM: CHEST - 2 VIEW COMPARISON:  December 24, 2018 FINDINGS: The right-sided Port-A-Cath has been removed. There is a new right-sided PICC line with tip terminating over the distal SVC. The cardiac size is stable. There is no pneumothorax. No large pleural effusion. The lung volumes are slightly low. IMPRESSION: No active cardiopulmonary disease. Electronically Signed   By: Constance Holster M.D.   On: 12/28/2018 18:53   Dg Ribs Unilateral W/chest Right  Result Date: 01/09/2019 CLINICAL DATA:  Fever, increasing ascites, fall several days prior, pain on right side chest. EXAM: RIGHT RIBS AND CHEST - 3+ VIEW COMPARISON:  Radiograph 01/06/2019, 12/28/2018 FINDINGS: Radiopaque marker placed at the level of the lateral right tenth rib, no subjacent fracture. No visible displaced rib fractures or other acute osseous abnormality. Degenerative changes in the spine and shoulders. Right upper extremity PICC tip  terminates at the superior cavoatrial junction. Surgical clips and TIPS catheter noted in the right upper quadrant. No acute soft tissue abnormality. Trace right pleural effusion and bandlike areas of subsegmental atelectasis the right lung base. Additional retrocardiac opacity is similar to prior exam. Lungs otherwise clear. Cardiomediastinal contours are unremarkable. IMPRESSION: No visible displaced rib fracture or other acute osseous abnormality. Right basilar and retrocardiac opacities could reflect atelectasis though developing consolidation could have a similar appearance given setting of fever. Trace right effusion. Electronically Signed   By: MD Lovena Le   On: 01/09/2019 16:15   Ct Abdomen Pelvis W Contrast  Result Date: 01/09/2019 CLINICAL DATA:  Fall, low back pain, pancreatic cancer with biliary stent  placement EXAM: CT ABDOMEN AND PELVIS WITH CONTRAST TECHNIQUE: Multidetector CT imaging of the abdomen and pelvis was performed using the standard protocol following bolus administration of intravenous contrast. CONTRAST:  168mL OMNIPAQUE IOHEXOL 300 MG/ML  SOLN COMPARISON:  12/24/2018 FINDINGS: Lower chest: Small to moderate bilateral pleural effusions and associated atelectasis or consolidation, new compared to prior examination. Coronary artery calcifications. Hepatobiliary: No solid liver abnormality is seen. Redemonstrated common bile duct stent with slightly increased biliary ductal dilatation and redemonstrated post stenting pneumobilia. No change in appearance or configuration of stent. Pancreas: Primary pancreatic mass is not clearly appreciated on this examination, with atrophy and ductal dilatation of the pancreatic body and tail. Spleen: Splenomegaly, maximum coronal span 19.3 cm. Adrenals/Urinary Tract: Adrenal glands are unremarkable. Nonobstructive bilateral nephrolithiasis. No hydronephrosis. Bladder is unremarkable. Stomach/Bowel: Stomach is within normal limits. Appendix appears  normal. No evidence of bowel wall thickening, distention, or inflammatory changes. Vascular/Lymphatic: Aortic atherosclerosis. No enlarged abdominal or pelvic lymph nodes. Reproductive: No mass or other significant abnormality. Other: No abdominal wall hernia or abnormality. Moderate to large volume ascites, which is increased compared to prior examination. Anasarca. Musculoskeletal: There are mildly displaced fracture heads of the right tenth and eleventh ribs as well as the right transverse process of the T10 vertebral body. IMPRESSION: 1. There are mildly displaced fracture heads of the right tenth and eleventh ribs as well as the right transverse process of the T10 vertebral body. 2. Redemonstrated common bile duct stent with slightly increased biliary ductal dilatation and redemonstrated post stenting pneumobilia. No change in appearance or configuration of stent. Primary pancreatic mass is not clearly appreciated on this examination, with atrophy and ductal dilatation of the pancreatic body and tail. 3. Pleural effusions, ascites, and anasarca, new and increased compared to prior examination. Electronically Signed   By: Eddie Candle M.D.   On: 01/09/2019 16:52   Ct Abdomen Pelvis W Contrast  Result Date: 12/24/2018 CLINICAL DATA:  Abdominal distension, fever, history of pancreatic cancer EXAM: CT ABDOMEN AND PELVIS WITH CONTRAST TECHNIQUE: Multidetector CT imaging of the abdomen and pelvis was performed using the standard protocol following bolus administration of intravenous contrast. CONTRAST:  155mL OMNIPAQUE IOHEXOL 300 MG/ML  SOLN COMPARISON:  MR abdomen pelvis, 12/04/2018, 10/30/2018 FINDINGS: Lower chest: No acute abnormality. Hepatobiliary: Known liver metastases are poorly appreciated by single-phase CT, for example in the dome of the liver (series 2, image 14). No gallstones or gallbladder wall thickening. Common bile duct stent is positioned tip in the descending portion of the duodenum with  post stenting pneumobilia. Pancreas: Discrete pancreatic head mass is not well appreciated, primarily appreciated by mass effect on the confluence of the portal vein (series 2, image 38. There is atrophy of the pancreatic body and tail and dilatation pancreatic duct. Spleen: Splenomegaly. Adrenals/Urinary Tract: Adrenal glands are unremarkable. Nonobstructive bilateral nephrolithiasis. Bladder is unremarkable. Stomach/Bowel: Stomach is within normal limits. Appendix is not clearly visualized. No evidence of bowel wall thickening, distention, or inflammatory changes. Large burden of stool in the distal colon and rectum. Vascular/Lymphatic: Aortic atherosclerosis. No enlarged abdominal or pelvic lymph nodes. Reproductive: No mass or other significant abnormality. Other: No abdominal wall hernia or abnormality. Moderate volume ascites. Musculoskeletal: No acute or significant osseous findings. IMPRESSION: 1. Redemonstrated pancreatic malignancy with hepatic metastases, status post common bile duct stenting. Primary mass and hepatic metastases are better appreciated by recent multiphasic MRI. 2.  Moderate volume ascites, which is similar to prior MRI. 3.  Splenomegaly. 4.  Large burden of  stool in the distal colon and rectum. 5.  Nonobstructive bilateral nephrolithiasis. Electronically Signed   By: Eddie Candle M.D.   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 Request is made for diagnostic and therapeutic paracentesis. EXAM: ULTRASOUND GUIDED DIAGNOSTIC AND THERAPEUTIC PARACENTESIS MEDICATIONS: 10 mL 1% lidocaine COMPLICATIONS: None immediate. PROCEDURE: Informed written consent was obtained from the patient after a discussion of the risks, benefits and alternatives to treatment. A timeout was performed prior to the initiation of the procedure. Initial ultrasound scanning demonstrates a moderate  amount of ascites within the right lower abdominal quadrant. The right lower abdomen was prepped and draped in the usual sterile fashion. 1% lidocaine was used for local anesthesia. Following this, a 19 gauge, 7-cm, Yueh catheter was introduced. An ultrasound image was saved for documentation purposes. The paracentesis was performed. The catheter was removed and a dressing was applied. The patient tolerated the procedure well without immediate post procedural complication. FINDINGS: A total of approximately 3.35 L of clear gold fluid was removed. Samples were sent to the laboratory as requested by the clinical team. IMPRESSION: Successful ultrasound-guided paracentesis yielding 3.35 L of peritoneal fluid. Read by: Earley Abide, PA-C Electronically Signed   By: Corrie Mckusick D.O.   On: 01/10/2019 16:46   US Paracentesis  Result Date: 12/31/2018 INDICATION: Patient with history of metastatic pancreatic cancer, ascites. Request made for diagnostic and therapeutic paracentesis. EXAM: ULTRASOUND GUIDED DIAGNOSTIC AND THERAPEUTIC PARACENTESIS MEDICATIONS: None COMPLICATIONS: None immediate. PROCEDURE: Informed written consent was obtained from the patient after a discussion of the risks, benefits and alternatives to treatment. A timeout was performed prior to the initiation of the procedure. Initial ultrasound scanning demonstrates a small to moderate amount of ascites within the right upper to mid abdominal quadrant. The right upper to mid abdomen was prepped and draped in the usual sterile fashion. 1% lidocaine was used for local anesthesia. Following this, a 19 gauge, 7-cm, Yueh catheter was introduced. An ultrasound image was saved for documentation purposes. The paracentesis was performed. The catheter was removed and a dressing was applied. The patient tolerated the procedure well without immediate post procedural complication. FINDINGS: A total of approximately 2.6 liters of slightly hazy, light yellow fluid  was removed. Samples were sent to the laboratory as requested by the clinical team. IMPRESSION: Successful ultrasound-guided diagnostic and therapeutic paracentesis yielding 2.6 liters of peritoneal fluid. Read by: Rowe Robert, PA-C Electronically Signed   By: Sandi Mariscal M.D.   On: 12/31/2018 12:04   Ir Removal [employer] W/ [place] W/o Fl Mod Sed  Result Date: 12/27/2018 CLINICAL DATA:  Bacteremia. Indwelling right IJ port catheter placed 08/30/2017 by Dr. Pascal Lux, has been working well without evident complication , removal requested EXAM: EXAM TUNNELED PORT CATHETER REMOVAL TECHNIQUE: The procedure, risks (including but not limited to bleeding, infection, organ damage ), benefits, and alternatives were explained to the patient. Questions regarding the procedure were encouraged and answered. The patient understands and consents to the procedure. Intravenous Fentanyl 163mcg and Versed 4mg  were administered as conscious sedation during continuous monitoring of the patient's level of consciousness and physiological / cardiorespiratory status by the radiology RN, with a total moderate sedation time of 37 minutes. Overlying skin prepped with chlorhexidine, draped in usual sterile fashion, infiltrated locally with 1% lidocaine. A small incision was made over the scar from previous placement. The port catheter was dissected free from the underlying soft  tissues and removed intact. There is no gross purulence. Hemostasis was achieved. The port pocket was closed with deep interrupted and subcuticular continuous 3-0 Monocryl sutures, then covered with Dermabond. The patient tolerated the procedure well. COMPLICATIONS: COMPLICATIONS None immediate IMPRESSION: 1.  Technically successful tunneled Port catheter removal. Electronically Signed   By: Lucrezia Europe M.D.   On: 12/27/2018 16:14   Dg Chest Portable 1 View  Result Date: 01/06/2019 CLINICAL DATA:  Patient with fever EXAM: PORTABLE CHEST 1 VIEW COMPARISON:  Chest  radiograph 12/28/2018 FINDINGS: Right upper extremity PICC line tip projects over the superior vena cava. Monitoring leads overlie the patient. Stable cardiac and mediastinal contours. Elevation of the right hemidiaphragm. Retrocardiac consolidative opacity. No pleural effusion or pneumothorax. IMPRESSION: Retrocardiac consolidative opacity which may represent atelectasis or pneumonia. Electronically Signed   By: Lovey Newcomer M.D.   On: 01/06/2019 21:02   Dg Chest Port 1 View  Result Date: 12/28/2018 CLINICAL DATA:  Sepsis hypertension EXAM: PORTABLE CHEST 1 VIEW COMPARISON:  12/28/2018, 12/24/2018 FINDINGS: Right upper extremity catheter tip overlies the SVC. No acute consolidation or effusion. Normal cardiomediastinal silhouette. No pneumothorax. IMPRESSION: No active disease. Electronically Signed   By: Donavan Foil M.D.   On: 12/28/2018 23:03   Dg Chest Portable 1 View  Result Date: 12/24/2018 CLINICAL DATA:  Fever and weakness. EXAM: PORTABLE CHEST 1 VIEW COMPARISON:  11/04/2018 FINDINGS: The cardiac silhouette, mediastinal and hilar contours are stable. Mild cardiac enlargement and mild tortuosity of the thoracic aorta. The right IJ power port is stable. The lungs are clear. No pleural effusions. The bony thorax is intact. IMPRESSION: No acute cardiopulmonary findings. Electronically Signed   By: Marijo Sanes M.D.   On: 12/24/2018 13:59   Dg Ercp  Result Date: 01/10/2019 CLINICAL DATA:  ERCP with biliary stent placement. EXAM: ERCP TECHNIQUE: Multiple spot images obtained with the fluoroscopic device and submitted for interpretation post-procedure. FLUOROSCOPY TIME:  3 minutes, 17 seconds (131 mGy) COMPARISON:  CT abdomen pelvis-01/09/2019 FINDINGS: 6 spot intraoperative fluoroscopic images of the right upper abdominal quadrant during ERCP are provided for review. Initial image demonstrates an ERCP probe overlying the right upper abdominal quadrant. Overlapping metallic internal biliary stents  overlie the expected location of the CBD. Surgical clips overlie the expected location of the pancreatic head. Subsequent images demonstrate selective cannulation and opacification of the biliary stents. There is suboptimal opacification the mid and distal aspects of the biliary stent, potentially secondary to subtotal occlusion. Subsequent images demonstrate presumed biliary sweeping and clearing of the biliary stents. There is faint opacification of the intrahepatic biliary tree which appears nondilated. IMPRESSION: ERCP with presumed internal biliary stent sleeping/clearing. These images were submitted for radiologic interpretation only. Please see the procedural report for the amount of contrast and the fluoroscopy time utilized. Electronically Signed   By: Sandi Mariscal M.D.   On: 01/10/2019 14:39   [place] Ascites (abdomen Limited)  Result Date: 12/25/2018 CLINICAL DATA:  62 year old male with abdominal distension, ascites EXAM: LIMITED ABDOMEN ULTRASOUND FOR ASCITES TECHNIQUE: Limited ultrasound survey for ascites was performed in all four abdominal quadrants. COMPARISON:  Prior CT scan of the abdomen and pelvis 12/24/2018 FINDINGS: Sonographic interrogation was performed of the 4 quadrants of the abdomen. There is trace perihepatic fluid in the right upper quadrant with multiple small loops of bowel. Overall, fluid volume is low. No safe window for paracentesis. IMPRESSION: Small volume ascites, insufficient for paracentesis. Electronically Signed   By: Jacqulynn Cadet M.D.   On: 12/25/2018  12:13   Ir Picc Placement Right >5 Yrs Inc Img Guide  Result Date: 12/27/2018 CLINICAL DATA:  Bacteremia, needs venous access for planned chemotherapy Monday EXAM: PICC PLACEMENT WITH ULTRASOUND AND FLUOROSCOPY FLUOROSCOPY TIME:  6 seconds, 1 mGy TECHNIQUE: After written informed consent was obtained, patient was placed in the supine position on angiographic table. Patency of the right basilic vein was confirmed with  ultrasound with image documentation. An appropriate skin site was determined. Skin site was marked. Region was prepped using maximum barrier technique including cap and mask, sterile gown, sterile gloves, large sterile sheet, and Chlorhexidine as cutaneous antisepsis. The region was infiltrated locally with 1% lidocaine. Under real-time ultrasound guidance, the right basilic vein was accessed with a 21 gauge micropuncture needle; the needle tip within the vein was confirmed with ultrasound image documentation. Needle exchanged over a 018 guidewire for a peel-away sheath, through which a 5-French single-lumen power injectable PICC trimmed to 46cm was advanced, positioned with its tip near the cavoatrial junction. Spot chest radiograph confirms appropriate catheter position. Catheter was flushed per protocol and secured externally. The patient tolerated procedure well. COMPLICATIONS: COMPLICATIONS none IMPRESSION: 1. Technically successful five [place] single lumen power injectable PICC placement Electronically Signed   By: Lucrezia Europe M.D.   On: 12/27/2018 16:15   [place] Ekg Site Rite  Result Date: 01/16/2019 If Site Rite image not attached, placement could not be confirmed due to current cardiac rhythm.  [place] Ekg Site Rite  Result Date: 01/04/2019 If Site Rite image not attached, placement could not be confirmed due to current cardiac rhythm.   Subjective: Eager to go home  Discharge Exam: Vitals:   01/16/19 0441 01/16/19 1329  BP: 123/85 123/82  Pulse: 67 69  Resp: 16 17  Temp: 98.4 F (36.9 C) 98.3 F (36.8 C)  SpO2: 96% 96%   Vitals:   01/15/19 1356 01/15/19 2053 01/16/19 0441 01/16/19 1329  BP: 121/83 128/89 123/85 123/82  Pulse: 73 67 67 69  Resp: 16 18 16 17   Temp: 98.3 F (36.8 C) 97.9 F (36.6 C) 98.4 F (36.9 C) 98.3 F (36.8 C)  TempSrc: Oral Oral Oral Oral  SpO2: 94% 96% 96% 96%  Weight:      Height:        General: Pt is alert, awake, not in acute distress Cardiovascular:  RRR, S1/S2 +, no rubs, no gallops Respiratory: CTA bilaterally, no wheezing, no rhonchi Abdominal: Soft, NT, ND, bowel sounds + Extremities: no edema, no cyanosis   The results of significant diagnostics from this hospitalization (including imaging, microbiology, ancillary and laboratory) are listed below for reference.     Microbiology: Recent Results (from the past 240 hour(s))  Blood Culture (routine x 2)     Status: None   Collection Time: 01/06/19  8:41 PM   Specimen: BLOOD  Result Value Ref Range Status   Specimen Description   Final    BLOOD RIGHT WRIST Performed at [facility] [address], [place], [place] [address]    Special Requests   Final    BOTTLES DRAWN AEROBIC AND ANAEROBIC Blood Culture results may not be optimal due to an excessive volume of blood received in culture bottles Performed at [facility] [address], [place], [place] [address]    Culture   Final    NO GROWTH 5 DAYS Performed at [facility], [place] [address] [place], [place] [address]    Report Status 01/12/2019 FINAL  Final  Blood Culture (routine x 2)     Status: None   Collection Time: 01/06/19  8:46 PM   Specimen: BLOOD  Result Value Ref Range Status   Specimen Description   Final    BLOOD LEFT WRIST Performed at [facility] [address], [place], [place] [address]    Special Requests   Final    BOTTLES DRAWN AEROBIC AND ANAEROBIC Blood Culture results may not be optimal due to an excessive volume of blood received in culture bottles Performed at [facility] [address] [place], [place] [address]    Culture   Final    NO GROWTH 5 DAYS Performed at [facility], [place] [address], [place], [place] [address]    Report Status 01/12/2019 FINAL  Final  Culture, Blood     Status: None   Collection Time: 01/09/19 12:30 PM   Specimen: BLOOD LEFT HAND  Result Value Ref Range Status    Specimen Description   Final    BLOOD LEFT HAND Performed at [facility], [place] [address] [place], [place] [address]    Special Requests   Final    BOTTLES DRAWN AEROBIC AND ANAEROBIC Blood Culture results may not be optimal due to an inadequate volume of blood received in culture bottles   Culture   Final    NO GROWTH 5 DAYS Performed at [facility], [place] [address], [place], [place] [address]    Report Status 01/14/2019 FINAL  Final  Culture, Blood     Status: Abnormal   Collection Time: 01/09/19  1:13 PM   Specimen: BLOOD  Result Value Ref Range Status   Specimen Description BLOOD PICC LINE  Final   Special Requests   Final    BOTTLES DRAWN AEROBIC AND ANAEROBIC Blood Culture adequate volume   Culture  Setup Time   Final    GRAM POSITIVE COCCI IN BOTH AEROBIC AND ANAEROBIC BOTTLES CRITICAL RESULT CALLED TO, READ BACK BY AND VERIFIED WITH: PHRMD B GREEN @0529  01/10/19 BY S GEZAHEGN Performed at [facility], [place] [address] [place], [place] [address]    Culture (A)  Final    ENTEROCOCCUS AVIUM STAPHYLOCOCCUS SPECIES (COAGULASE NEGATIVE)    Report Status 01/12/2019 FINAL  Final   Organism ID, Bacteria ENTEROCOCCUS AVIUM  Final      Susceptibility   Enterococcus avium - MIC*    AMPICILLIN <=2 SENSITIVE Sensitive     VANCOMYCIN <=0.5 SENSITIVE Sensitive     GENTAMICIN SYNERGY SENSITIVE Sensitive     * ENTEROCOCCUS AVIUM  Blood Culture ID Panel (Reflexed)     Status: Abnormal   Collection Time: 01/09/19  1:13 PM  Result Value Ref Range Status   Enterococcus species DETECTED (A) NOT DETECTED Final    Comment: CRITICAL RESULT CALLED TO, READ BACK BY AND VERIFIED WITH: PHRMD B GREEN @0529  01/10/19 BY S GEZAHEGN    Vancomycin resistance NOT DETECTED NOT DETECTED Final   Listeria monocytogenes NOT DETECTED NOT DETECTED Final   Staphylococcus species DETECTED (A) NOT DETECTED Final    Comment: Methicillin (oxacillin) resistant coagulase negative  staphylococcus. Possible blood culture contaminant (unless isolated from more than one blood culture draw or clinical case suggests pathogenicity). No antibiotic treatment is indicated for blood  culture contaminants. CRITICAL RESULT CALLED TO, READ BACK BY AND VERIFIED WITH: PHRMD B GREEN @0529  01/10/19 BY S GEZAHEGN    Staphylococcus aureus (BCID) NOT DETECTED NOT DETECTED Final  Methicillin resistance DETECTED (A) NOT DETECTED Final    Comment: CRITICAL RESULT CALLED TO, READ BACK BY AND VERIFIED WITH: PHRMD B GREEN @0529  01/10/19 BY S GEZAHEGN    Streptococcus species NOT DETECTED NOT DETECTED Final   Streptococcus agalactiae NOT DETECTED NOT DETECTED Final   Streptococcus pneumoniae NOT DETECTED NOT DETECTED Final   Streptococcus pyogenes NOT DETECTED NOT DETECTED Final   Acinetobacter baumannii NOT DETECTED NOT DETECTED Final   Enterobacteriaceae species NOT DETECTED NOT DETECTED Final   Enterobacter cloacae complex NOT DETECTED NOT DETECTED Final   Escherichia coli NOT DETECTED NOT DETECTED Final   Klebsiella oxytoca NOT DETECTED NOT DETECTED Final   Klebsiella pneumoniae NOT DETECTED NOT DETECTED Final   Proteus species NOT DETECTED NOT DETECTED Final   Serratia marcescens NOT DETECTED NOT DETECTED Final   Haemophilus influenzae NOT DETECTED NOT DETECTED Final   Neisseria meningitidis NOT DETECTED NOT DETECTED Final   Pseudomonas aeruginosa NOT DETECTED NOT DETECTED Final   Candida albicans NOT DETECTED NOT DETECTED Final   Candida glabrata NOT DETECTED NOT DETECTED Final   Candida krusei NOT DETECTED NOT DETECTED Final   Candida parapsilosis NOT DETECTED NOT DETECTED Final   Candida tropicalis NOT DETECTED NOT DETECTED Final    Comment: Performed at [facility], [place] [address] [place], [place] [address]  SARS Coronavirus 2 (CEPHEID - Performed in [facility] lab), Hosp Order     Status: None   Collection Time: 01/09/19  6:07 PM   Specimen: Nasopharyngeal Swab   Result Value Ref Range Status   SARS Coronavirus 2 NEGATIVE NEGATIVE Final    Comment: (NOTE) If result is NEGATIVE SARS-CoV-2 target nucleic acids are NOT DETECTED. The SARS-CoV-2 RNA is generally detectable in upper and lower  respiratory specimens during the acute phase of infection. The lowest  concentration of SARS-CoV-2 viral copies this assay can detect is 250  copies / mL. A negative result does not preclude SARS-CoV-2 infection  and should not be used as the sole basis for treatment or other  patient management decisions.  A negative result may occur with  improper specimen collection / handling, submission of specimen other  than nasopharyngeal swab, presence of viral mutation(s) within the  areas targeted by this assay, and inadequate number of viral copies  (<250 copies / mL). A negative result must be combined with clinical  observations, patient history, and epidemiological information. If result is POSITIVE SARS-CoV-2 target nucleic acids are DETECTED. The SARS-CoV-2 RNA is generally detectable in upper and lower  respiratory specimens dur ing the acute phase of infection.  Positive  results are indicative of active infection with SARS-CoV-2.  Clinical  correlation with patient history and other diagnostic information is  necessary to determine patient infection status.  Positive results do  not rule out bacterial infection or co-infection with other viruses. If result is PRESUMPTIVE POSTIVE SARS-CoV-2 nucleic acids MAY BE PRESENT.   A presumptive positive result was obtained on the submitted specimen  and confirmed on repeat testing.  While 2019 novel coronavirus  (SARS-CoV-2) nucleic acids may be present in the submitted sample  additional confirmatory testing may be necessary for epidemiological  and / or clinical management purposes  to differentiate between  SARS-CoV-2 and other Sarbecovirus currently known to infect humans.  If clinically indicated additional  testing with an alternate test  methodology [phone] is advised. The SARS-CoV-2 RNA is generally  detectable in upper and lower respiratory sp ecimens during the acute  phase of  infection. The expected result is Negative. Fact Sheet for Patients:  StrictlyIdeas.no Fact Sheet for Healthcare Providers: BankingDealers.co.za This test is not yet approved or cleared by the [place] FDA and has been authorized for detection and/or diagnosis of SARS-CoV-2 by FDA under an Emergency Use Authorization (EUA).  This EUA will remain in effect (meaning this test can be used) for the duration of the COVID-19 declaration under Section 564(b)(1) of the Act, 21 U.S.C. section 360bbb-3(b)(1), unless the authorization is terminated or revoked sooner. Performed at [facility], [place] [address], [place], [place] [address]   Body fluid culture     Status: None   Collection Time: 01/10/19  4:16 PM   Specimen: Peritoneal Fluid  Result Value Ref Range Status   Specimen Description   Final    PERITONEAL Performed at [facility] [address], [place], [place] [address]    Special Requests   Final    NONE Performed at [facility], [place] [address], [place], [place] [address]    Gram Stain   Final    FEW WBC PRESENT,BOTH PMN AND MONONUCLEAR NO ORGANISMS SEEN    Culture   Final    NO GROWTH 3 DAYS Performed at [facility], [place] [address], [place], [place] [address]    Report Status 01/14/2019 FINAL  Final  Urine culture     Status: None   Collection Time: 01/11/19  4:50 AM   Specimen: Urine, Clean Catch  Result Value Ref Range Status   Specimen Description   Final    URINE, CLEAN CATCH Performed at [facility], [place] [address] [place], [place] [address]    Special Requests   Final    NONE Performed at [facility], [place] [address]  [place], [place] [address]    Culture   Final    NO GROWTH Performed at [facility], [place] [address], [place], [place] [address]    Report Status 01/12/2019 FINAL  Final  Culture, blood (routine x 2)     Status: Abnormal   Collection Time: 01/11/19  4:08 PM   Specimen: BLOOD RIGHT FOREARM  Result Value Ref Range Status   Specimen Description   Final    BLOOD RIGHT FOREARM Performed at [facility] [address], [place], [place] [address]    Special Requests   Final    BOTTLES DRAWN AEROBIC AND ANAEROBIC Blood Culture adequate volume Performed at [facility] [address] [place], [place] [address]    Culture  Setup Time   Final    GRAM POSITIVE COCCI AEROBIC BOTTLE ONLY CRITICAL VALUE NOTED.  VALUE IS CONSISTENT WITH PREVIOUSLY REPORTED AND CALLED VALUE. Performed at [facility], [place] [address], [place],  [address]    Culture ENTEROCOCCUS AVIUM (A)  Final   Report Status 01/15/2019 FINAL  Final   Organism ID, Bacteria ENTEROCOCCUS AVIUM  Final      Susceptibility   Enterococcus avium - MIC*    AMPICILLIN <=2 SENSITIVE Sensitive     VANCOMYCIN <=0.5 SENSITIVE Sensitive     GENTAMICIN SYNERGY SENSITIVE Sensitive     * ENTEROCOCCUS AVIUM  Culture, blood (routine x 2)     Status: None   Collection Time: 01/11/19  4:08 PM   Specimen: BLOOD RIGHT FOREARM  Result Value Ref Range Status   Specimen Description   Final    BLOOD RIGHT FOREARM Performed  at [facility], [place] [address] [place], [place] [address]    Special Requests   Final    BOTTLES DRAWN AEROBIC AND ANAEROBIC Blood Culture adequate volume Performed at [facility] [address] [place], [place] [address]    Culture   Final    NO GROWTH 5 DAYS Performed at [facility], [place] [address], [place], [place] [address]    Report Status 01/16/2019 FINAL  Final  Culture, blood (routine x 2)     Status: None  (Preliminary result)   Collection Time: 01/14/19  1:44 PM   Specimen: BLOOD LEFT HAND  Result Value Ref Range Status   Specimen Description   Final    BLOOD LEFT HAND Performed at [facility] [address], [place], [place] [address]    Special Requests   Final    BOTTLES DRAWN AEROBIC ONLY Blood Culture adequate volume Performed at [facility] [address] [place], [place] [address]    Culture   Final    NO GROWTH 2 DAYS Performed at [facility] [address] [place], [place] [address]    Report Status PENDING  Incomplete  Culture, blood (routine x 2)     Status: None (Preliminary result)   Collection Time: 01/14/19  1:48 PM   Specimen: Right Antecubital; Blood  Result Value Ref Range Status   Specimen Description   Final    RIGHT ANTECUBITAL Performed at [facility] [address], [place], [place] [address]    Special Requests   Final    BOTTLES DRAWN AEROBIC AND ANAEROBIC Blood Culture adequate volume Performed at [facility] [address], [place], [place] [address]    Culture   Final    NO GROWTH 2 DAYS Performed at [facility] [address] [place], [place] [address]    Report Status PENDING  Incomplete     Labs: BNP (last 3 results) No results for input(s): BNP in the last 8760 hours. Basic Metabolic Panel: Recent Labs  Lab 01/10/19 0352 01/11/19 0430 01/12/19 0753 01/13/19 0635 01/15/19 0832  NA 130* 136 132* 131* 132*  K 4.2 4.1 3.9 3.9 4.1  CL 95* 101 96* 96* 96*  CO2 26 28 28 28 25   GLUCOSE 101* 115* 117* 103* 122*  BUN 8 10 11 10  7*  CREATININE 0.40* 0.51* 0.49* 0.47* 0.55*  CALCIUM 7.8* 7.8* 7.6* 7.7* 8.0*  MG 1.8 2.0 2.0 1.9  --   PHOS  --  3.4  --  2.8 2.8   Liver Function Tests: Recent Labs  Lab 01/10/19 0352 01/11/19 0430 01/12/19 0753 01/13/19 0635 01/15/19 0832  AST 171* 98* 53*  --   --   ALT 133* 100* 71*  --   --    ALKPHOS 778* 749* 711*  --   --   BILITOT 9.0* 7.1* 5.0*  --   --   PROT 4.9* 4.4* 4.4*  --   --   ALBUMIN 2.1* 1.9* 1.8* 1.9* 2.2*   No results for input(s): LIPASE, AMYLASE in the last 168 hours. No results for input(s): AMMONIA in the last 168 hours. CBC: Recent Labs  Lab 01/10/19 0352 01/11/19 0430 01/12/19 0753 01/13/19 0635 01/15/19 0832  WBC 3.7* 2.5* 2.6* 2.0* 2.5*  NEUTROABS  --  2.0 2.1 1.5* 2.0  HGB 8.7* 8.1* 8.1* 8.4* 9.2*  HCT 27.9* 26.2* 25.4* 27.2* 29.7*  MCV  88.3 88.5 88.8 89.2 91.1  PLT 59* 61* 69* 90* 121*   Cardiac Enzymes: No results for input(s): CKTOTAL, CKMB, CKMBINDEX, TROPONINI in the last 168 hours. BNP: Invalid input(s): POCBNP CBG: No results for input(s): GLUCAP in the last 168 hours. D-Dimer No results for input(s): DDIMER in the last 72 hours. Hgb A1c No results for input(s): HGBA1C in the last 72 hours. Lipid Profile No results for input(s): CHOL, HDL, LDLCALC, TRIG, CHOLHDL, LDLDIRECT in the last 72 hours. Thyroid function studies No results for input(s): TSH, T4TOTAL, T3FREE, THYROIDAB in the last 72 hours.  Invalid input(s): FREET3 Anemia work up No results for input(s): VITAMINB12, FOLATE, FERRITIN, TIBC, IRON, RETICCTPCT in the last 72 hours. Urinalysis    Component Value Date/Time   COLORURINE AMBER (A) 01/11/2019 0450   APPEARANCEUR CLEAR 01/11/2019 0450   LABSPEC 1.009 01/11/2019 0450   PHURINE 6.0 01/11/2019 0450   GLUCOSEU 50 (A) 01/11/2019 0450   HGBUR NEGATIVE 01/11/2019 0450   BILIRUBINUR SMALL (A) 01/11/2019 0450   KETONESUR 5 (A) 01/11/2019 0450   PROTEINUR NEGATIVE 01/11/2019 0450   UROBILINOGEN 0.2 02/10/2007 1109   NITRITE NEGATIVE 01/11/2019 0450   LEUKOCYTESUR NEGATIVE 01/11/2019 0450   Sepsis Labs Invalid input(s): PROCALCITONIN,  WBC,  LACTICIDVEN Microbiology Recent Results (from the past 240 hour(s))  Blood Culture (routine x 2)     Status: None   Collection Time: 01/06/19  8:41 PM   Specimen:  BLOOD  Result Value Ref Range Status   Specimen Description   Final    BLOOD RIGHT WRIST Performed at [facility], [place] [address], [place], [place] [address]    Special Requests   Final    BOTTLES DRAWN AEROBIC AND ANAEROBIC Blood Culture results may not be optimal due to an excessive volume of blood received in culture bottles Performed at [facility] [address], [place], [place] [address]    Culture   Final    NO GROWTH 5 DAYS Performed at [facility], [place] [address], [place], [place] [address]    Report Status 01/12/2019 FINAL  Final  Blood Culture (routine x 2)     Status: None   Collection Time: 01/06/19  8:46 PM   Specimen: BLOOD  Result Value Ref Range Status   Specimen Description   Final    BLOOD LEFT WRIST Performed at [facility] [address] [place], [place] [address]    Special Requests   Final    BOTTLES DRAWN AEROBIC AND ANAEROBIC Blood Culture results may not be optimal due to an excessive volume of blood received in culture bottles Performed at [facility] [address] [place], [place] [address]    Culture   Final    NO GROWTH 5 DAYS Performed at [facility], [name] [address], [place], [place] [address]    Report Status 01/12/2019 FINAL  Final  Culture, Blood     Status: None   Collection Time: 01/09/19 12:30 PM   Specimen: BLOOD LEFT HAND  Result Value Ref Range Status   Specimen Description   Final    BLOOD LEFT HAND Performed at [facility], [place] [address] [place], [place] [address]    Special Requests   Final    BOTTLES DRAWN AEROBIC AND ANAEROBIC Blood Culture results may not be optimal due to an inadequate volume of blood received in culture bottles   Culture   Final  NO GROWTH 5 DAYS Performed at [facility], [place] [address], [place], [place] [address]    Report Status 01/14/2019 FINAL  Final  Culture, Blood      Status: Abnormal   Collection Time: 01/09/19  1:13 PM   Specimen: BLOOD  Result Value Ref Range Status   Specimen Description BLOOD PICC LINE  Final   Special Requests   Final    BOTTLES DRAWN AEROBIC AND ANAEROBIC Blood Culture adequate volume   Culture  Setup Time   Final    GRAM POSITIVE COCCI IN BOTH AEROBIC AND ANAEROBIC BOTTLES CRITICAL RESULT CALLED TO, READ BACK BY AND VERIFIED WITH: PHRMD B GREEN @0529  01/10/19 BY S GEZAHEGN Performed at [facility], [place] [address], [place], [place] [address]    Culture (A)  Final    ENTEROCOCCUS AVIUM STAPHYLOCOCCUS SPECIES (COAGULASE NEGATIVE)    Report Status 01/12/2019 FINAL  Final   Organism ID, Bacteria ENTEROCOCCUS AVIUM  Final      Susceptibility   Enterococcus avium - MIC*    AMPICILLIN <=2 SENSITIVE Sensitive     VANCOMYCIN <=0.5 SENSITIVE Sensitive     GENTAMICIN SYNERGY SENSITIVE Sensitive     * ENTEROCOCCUS AVIUM  Blood Culture ID Panel (Reflexed)     Status: Abnormal   Collection Time: 01/09/19  1:13 PM  Result Value Ref Range Status   Enterococcus species DETECTED (A) NOT DETECTED Final    Comment: CRITICAL RESULT CALLED TO, READ BACK BY AND VERIFIED WITH: PHRMD B GREEN @0529  01/10/19 BY S GEZAHEGN    Vancomycin resistance NOT DETECTED NOT DETECTED Final   Listeria monocytogenes NOT DETECTED NOT DETECTED Final   Staphylococcus species DETECTED (A) NOT DETECTED Final    Comment: Methicillin (oxacillin) resistant coagulase negative staphylococcus. Possible blood culture contaminant (unless isolated from more than one blood culture draw or clinical case suggests pathogenicity). No antibiotic treatment is indicated for blood  culture contaminants. CRITICAL RESULT CALLED TO, READ BACK BY AND VERIFIED WITH: PHRMD B GREEN @0529  01/10/19 BY S GEZAHEGN    Staphylococcus aureus (BCID) NOT DETECTED NOT DETECTED Final   Methicillin resistance DETECTED (A) NOT DETECTED Final    Comment: CRITICAL RESULT CALLED TO, READ BACK BY  AND VERIFIED WITH: PHRMD B GREEN @0529  01/10/19 BY S GEZAHEGN    Streptococcus species NOT DETECTED NOT DETECTED Final   Streptococcus agalactiae NOT DETECTED NOT DETECTED Final   Streptococcus pneumoniae NOT DETECTED NOT DETECTED Final   Streptococcus pyogenes NOT DETECTED NOT DETECTED Final   Acinetobacter baumannii NOT DETECTED NOT DETECTED Final   Enterobacteriaceae species NOT DETECTED NOT DETECTED Final   Enterobacter cloacae complex NOT DETECTED NOT DETECTED Final   Escherichia coli NOT DETECTED NOT DETECTED Final   Klebsiella oxytoca NOT DETECTED NOT DETECTED Final   Klebsiella pneumoniae NOT DETECTED NOT DETECTED Final   Proteus species NOT DETECTED NOT DETECTED Final   Serratia marcescens NOT DETECTED NOT DETECTED Final   Haemophilus influenzae NOT DETECTED NOT DETECTED Final   Neisseria meningitidis NOT DETECTED NOT DETECTED Final   Pseudomonas aeruginosa NOT DETECTED NOT DETECTED Final   Candida albicans NOT DETECTED NOT DETECTED Final   Candida glabrata NOT DETECTED NOT DETECTED Final   Candida krusei NOT DETECTED NOT DETECTED Final   Candida parapsilosis NOT DETECTED NOT DETECTED Final   Candida tropicalis NOT DETECTED NOT DETECTED Final    Comment: Performed at [facility], [place] [address] [place], [place] [address]  SARS Coronavirus 2 (CEPHEID - Performed  in [place] lab), Hosp Order     Status: None   Collection Time: 01/09/19  6:07 PM   Specimen: Nasopharyngeal Swab  Result Value Ref Range Status   SARS Coronavirus 2 NEGATIVE NEGATIVE Final    Comment: (NOTE) If result is NEGATIVE SARS-CoV-2 target nucleic acids are NOT DETECTED. The SARS-CoV-2 RNA is generally detectable in upper and lower  respiratory specimens during the acute phase of infection. The lowest  concentration of SARS-CoV-2 viral copies this assay can detect is 250  copies / mL. A negative result does not preclude SARS-CoV-2 infection  and should not be used as the sole basis for  treatment or other  patient management decisions.  A negative result may occur with  improper specimen collection / handling, submission of specimen other  than nasopharyngeal swab, presence of viral mutation(s) within the  areas targeted by this assay, and inadequate number of viral copies  (<250 copies / mL). A negative result must be combined with clinical  observations, patient history, and epidemiological information. If result is POSITIVE SARS-CoV-2 target nucleic acids are DETECTED. The SARS-CoV-2 RNA is generally detectable in upper and lower  respiratory specimens dur ing the acute phase of infection.  Positive  results are indicative of active infection with SARS-CoV-2.  Clinical  correlation with patient history and other diagnostic information is  necessary to determine patient infection status.  Positive results do  not rule out bacterial infection or co-infection with other viruses. If result is PRESUMPTIVE POSTIVE SARS-CoV-2 nucleic acids MAY BE PRESENT.   A presumptive positive result was obtained on the submitted specimen  and confirmed on repeat testing.  While 2019 novel coronavirus  (SARS-CoV-2) nucleic acids may be present in the submitted sample  additional confirmatory testing may be necessary for epidemiological  and / or clinical management purposes  to differentiate between  SARS-CoV-2 and other Sarbecovirus currently known to infect humans.  If clinically indicated additional testing with an alternate test  methodology [phone]) is advised. The SARS-CoV-2 RNA is generally  detectable in upper and lower respiratory sp ecimens during the acute  phase of infection. The expected result is Negative. Fact Sheet for Patients:  StrictlyIdeas.no Fact Sheet for Healthcare Providers: BankingDealers.co.za This test is not yet approved or cleared by the [place] FDA and has been authorized for detection and/or  diagnosis of SARS-CoV-2 by FDA under an Emergency Use Authorization (EUA).  This EUA will remain in effect (meaning this test can be used) for the duration of the COVID-19 declaration under Section 564(b)(1) of the Act, 21 U.S.C. section 360bbb-3(b)(1), unless the authorization is terminated or revoked sooner. Performed at [facility], [place] [address] [place], [place] [address]   Body fluid culture     Status: None   Collection Time: 01/10/19  4:16 PM   Specimen: Peritoneal Fluid  Result Value Ref Range Status   Specimen Description   Final    PERITONEAL Performed at [place] [address], [place], [place] [address]    Special Requests   Final    NONE Performed at [facility], [place] [address], [place], [place] [address]    Gram Stain   Final    FEW WBC PRESENT,BOTH PMN AND MONONUCLEAR NO ORGANISMS SEEN    Culture   Final    NO GROWTH 3 DAYS Performed at [facility], [place] [address], [place],  [address]    Report Status 01/14/2019 FINAL  Final  Urine culture     Status: None   Collection Time: 01/11/19  4:50 AM   Specimen: Urine, Clean Catch  Result Value Ref Range Status   Specimen Description   Final    URINE, CLEAN CATCH Performed at [facility], [place] [address], [place], [place] [address]    Special Requests   Final    NONE Performed at [facility], [place] [address] [place], [place] [address]    Culture   Final    NO GROWTH Performed at [facility], [place] [address], [place], [place] [address]    Report Status 01/12/2019 FINAL  Final  Culture, blood (routine x 2)     Status: Abnormal   Collection Time: 01/11/19  4:08 PM   Specimen: BLOOD RIGHT FOREARM  Result Value Ref Range Status   Specimen Description   Final    BLOOD RIGHT FOREARM Performed at [facility] [address], [place], [place] [address]    Special  Requests   Final    BOTTLES DRAWN AEROBIC AND ANAEROBIC Blood Culture adequate volume Performed at [place] [address], [place], [place] [address]    Culture  Setup Time   Final    GRAM POSITIVE COCCI AEROBIC BOTTLE ONLY CRITICAL VALUE NOTED.  VALUE IS CONSISTENT WITH PREVIOUSLY REPORTED AND CALLED VALUE. Performed at [facility], [place] [address], [place], [place] [address]    Culture ENTEROCOCCUS AVIUM (A)  Final   Report Status 01/15/2019 FINAL  Final   Organism ID, Bacteria ENTEROCOCCUS AVIUM  Final      Susceptibility   Enterococcus avium - MIC*    AMPICILLIN <=2 SENSITIVE Sensitive     VANCOMYCIN <=0.5 SENSITIVE Sensitive     GENTAMICIN SYNERGY SENSITIVE Sensitive     * ENTEROCOCCUS AVIUM  Culture, blood (routine x 2)     Status: None   Collection Time: 01/11/19  4:08 PM   Specimen: BLOOD RIGHT FOREARM  Result Value Ref Range Status   Specimen Description   Final    BLOOD RIGHT FOREARM Performed at [facility] [address], [place], [place] [address]    Special Requests   Final    BOTTLES DRAWN AEROBIC AND ANAEROBIC Blood Culture adequate volume Performed at [place] [address], [place], [place] [address]    Culture   Final    NO GROWTH 5 DAYS Performed at [facility], [place] [address], [place], [place] [address]    Report Status 01/16/2019 FINAL  Final  Culture, blood (routine x 2)     Status: None (Preliminary result)   Collection Time: 01/14/19  1:44 PM   Specimen: BLOOD LEFT HAND  Result Value Ref Range Status   Specimen Description   Final    BLOOD LEFT HAND Performed at [facility] [address] [place], [place] [address]    Special Requests   Final    BOTTLES DRAWN AEROBIC ONLY Blood Culture adequate volume Performed at [facility] [address] [place], [place] [address]    Culture   Final    NO GROWTH 2 DAYS Performed  at [facility] [address], [place], [place] [address]    Report Status PENDING  Incomplete  Culture, blood (routine x 2)     Status: None (Preliminary result)   Collection Time: 01/14/19  1:48 PM   Specimen: Right Antecubital;  Blood  Result Value Ref Range Status   Specimen Description   Final    RIGHT ANTECUBITAL Performed at [facility] [address], [place], [place] [address]    Special Requests   Final    BOTTLES DRAWN AEROBIC AND ANAEROBIC Blood Culture adequate volume Performed at [facility] [address], [place], [place] [address]    Culture   Final    NO GROWTH 2 DAYS Performed at [facility] [address], [place], [place] [address]    Report Status PENDING  Incomplete   Time spent: 30 min  SIGNED:   Marylu Lund, MD  [facility] 01/16/2019, 2:12 PM  If 7PM-7AM, please contact night-coverage

## 2019-01-16 NOTE — TOC Transition Note (Signed)
Transition of Care [facility]) - CM/SW Discharge Note   Patient Details  Name: Trayvion Embleton MRN: [id number] Date of Birth: 10/20/1955  Transition of Care [facility]) CM/SW Contact:  Cassidi Modesitt, Marjie Skiff, RN Phone Number: 01/16/2019, 11:47 AM   Clinical Narrative:     Per Anderson [place] from [facility] they will be able to provide home palliative services not home hospice services due to pt discharging with IV abx. This CM informed wife and offered choice for home infusion services and [facility] was chosen [facility]). Pam [facility] liaison called to give referral.   Readmission Risk Interventions Readmission Risk Prevention Plan 12/31/2018  Transportation Screening Complete  Medication Review Press photographer) Complete  PCP or Specialist appointment within 3-5 days of discharge Complete  HRI or Home Care Consult Patient refused  SW Recovery Care/Counseling Consult Patient refused  Palliative Care Screening Not [place] Not Applicable  Some recent data might be hidden    Marney Doctor RN,BSN [phone]

## 2019-01-16 NOTE — Progress Notes (Signed)
Manufacturing engineer [facility])  Spoke with wife [name] for discharge plans.  They do not need any DME to go home.  Plan is to d/c once IV therapy is complete for IV abx at home.    Thank you, Venia Carbon RN, BSN, [facility] Liaison (in [place]) [phone]

## 2019-01-16 NOTE — Progress Notes (Signed)
Peripherally Inserted Central Catheter/Midline Placement  The IV Nurse has discussed with the patient and/or persons authorized to consent for the patient, the purpose of this procedure and the potential benefits and risks involved with this procedure.  The benefits include less needle sticks, lab draws from the catheter, and the patient may be discharged home with the catheter. Risks include, but not limited to, infection, bleeding, blood clot (thrombus formation), and puncture of an artery; nerve damage and irregular heartbeat and possibility to perform a PICC exchange if needed/ordered by physician.  Alternatives to this procedure were also discussed.  Bard Power PICC patient education guide, fact sheet on infection prevention and patient information card has been provided to patient /or left at bedside.    PICC/Midline Placement Documentation  PICC Single Lumen 01/16/19 PICC Left Brachial 47 cm 0 cm (Active)  Indication for Insertion or Continuance of Line Home intravenous therapies (PICC only) 01/16/19 1427  Exposed Catheter (cm) 0 cm 01/16/19 1427  Site Assessment Clean;Dry;Intact 01/16/19 1427  Line Status Flushed;Saline locked;Blood return noted 01/16/19 1427  Dressing Type Transparent 01/16/19 1427  Dressing Status Clean;Dry;Intact;Antimicrobial disc in place 01/16/19 1427  Dressing Change Due 01/23/19 01/16/19 1427       Gordan Payment 01/16/2019, 2:29 PM

## 2019-01-17 ENCOUNTER — Telehealth: Payer: Self-pay | Admitting: *Deleted

## 2019-01-17 DIAGNOSIS — C259 Malignant neoplasm of pancreas, unspecified: Secondary | ICD-10-CM | POA: Diagnosis not present

## 2019-01-17 DIAGNOSIS — R7881 Bacteremia: Secondary | ICD-10-CM | POA: Diagnosis not present

## 2019-01-17 NOTE — Telephone Encounter (Signed)
Per Dr. Benay Spice: scheduled f/u on 01/25/19 at 1:45 pm with [name]/BS. Called and left VM with appointment with request to call back or send Mychart message with confirmation.

## 2019-01-18 ENCOUNTER — Telehealth: Payer: Self-pay | Admitting: Internal Medicine

## 2019-01-18 LAB — CULTURE, BLOOD (ROUTINE X 2): Special Requests: ADEQUATE

## 2019-01-18 NOTE — Telephone Encounter (Signed)
I had rec'd a message from wife [name] and  I returned her call and we discussed Palliative services and she was in agreement with scheduling a visit.  We scheduled a Telehealth Palliative consult for 01/24/19 @ 11 AM.

## 2019-01-18 NOTE — Telephone Encounter (Signed)
Called patient' wife Cory Kelly to schedule Palliative Consult, no answer.  Left message with reason for call along with my name and contact number

## 2019-01-19 LAB — CULTURE, BLOOD (ROUTINE X 2)
Culture: NO GROWTH
Culture: NO GROWTH
Special Requests: ADEQUATE
Special Requests: ADEQUATE

## 2019-01-20 DIAGNOSIS — A401 Sepsis due to streptococcus, group B: Secondary | ICD-10-CM | POA: Diagnosis not present

## 2019-01-20 DIAGNOSIS — R7881 Bacteremia: Secondary | ICD-10-CM | POA: Diagnosis not present

## 2019-01-20 DIAGNOSIS — C259 Malignant neoplasm of pancreas, unspecified: Secondary | ICD-10-CM | POA: Diagnosis not present

## 2019-01-21 ENCOUNTER — Ambulatory Visit: Payer: BC Managed Care – PPO | Admitting: Infectious Diseases

## 2019-01-24 ENCOUNTER — Other Ambulatory Visit: Payer: Self-pay

## 2019-01-24 ENCOUNTER — Other Ambulatory Visit: Payer: BC Managed Care – PPO | Admitting: Internal Medicine

## 2019-01-24 DIAGNOSIS — A419 Sepsis, unspecified organism: Secondary | ICD-10-CM | POA: Diagnosis not present

## 2019-01-24 DIAGNOSIS — A401 Sepsis due to streptococcus, group B: Secondary | ICD-10-CM | POA: Diagnosis not present

## 2019-01-24 DIAGNOSIS — Z515 Encounter for palliative care: Secondary | ICD-10-CM | POA: Diagnosis not present

## 2019-01-24 DIAGNOSIS — C259 Malignant neoplasm of pancreas, unspecified: Secondary | ICD-10-CM | POA: Diagnosis not present

## 2019-01-24 DIAGNOSIS — T8579XD Infection and inflammatory reaction due to other internal prosthetic devices, implants and grafts, subsequent encounter: Secondary | ICD-10-CM | POA: Diagnosis not present

## 2019-01-24 DIAGNOSIS — R7881 Bacteremia: Secondary | ICD-10-CM | POA: Diagnosis not present

## 2019-01-24 NOTE — Progress Notes (Signed)
[facility] Consult Note Telephone: [phone]  Fax: [phone]  PATIENT NAME: Cory Kelly DOB: 06/01/1956 MRN: [id number]  PRIMARY CARE PROVIDER:   Mittie Bodo  REFERRING PROVIDER:  Mancel Bale, PA-C [address] [place],  [place] [address]  RESPONSIBLE PARTY:   Self       RECOMMENDATIONS and PLAN:  Palliative Care Encounter Z51.5  1.  Poor appetite:  Related to advanced disease.  Small frequent meals/snacks.  Supplement with nutritional beverages. Remain hydrated.   2  Unsteady gait:  Fall risk prevention discussed with pt and wife.  Encouraged use of walker.   3..  Edema: Related to low albumin/protein and advanced cancer.  Leg elevation and compression socks.  Continue diuretic therapy.  Low sodium intake.   4.  Advanced care planning:  Reviewed goals of care and advanced directives.  Pt's goals are to "continue to fight" this disease.  He will continue IV antibiotics and antifungal treatments at home with assistance of wife. He desires to remain as a full code with full scope of treatment.  Palliative care will FU with pt on 01/29/19 for additional discussion.   Due to the COVID-19 crisis, this visit was performed via Telehealth from my office and was initiated and consented by patient and or family.  I spent 45 minutes providing this consultation,  from 1100 to 1145. More than 50% of the time in this consultation was spent coordinating communication with patient and wife.   HISTORY OF PRESENT ILLNESS:  Cory Kelly is a 63 y.o. year old male with multiple medical problems including pancreatic cancer with ascities and anasarca.  He currently lives at home and is able to perform some of his ADLs with assistance of his wife. He is ambulatory and is able to feed self.  Reports weakness.  Hospitalization this month due to bacteremia and candidemia.   Palliative Care was asked to help address goals of care.    CODE STATUS: FULL CODE  PPS: 40% HOSPICE ELIGIBILITY/DIAGNOSIS: YES/ Pancreatic cancer/Declined by patient at this time  PAST MEDICAL HISTORY:  Past Medical History:  Diagnosis Date  . Abdominal pain    due to bloating  . Bloating   . Cancer ([place])    skin cancer  . Essential hypertension   . Fatigue   . History of kidney stones    passed  . History of weight loss   . HOH (hard of hearing)    no hearing aids  . Neuralgia   . Pancreatic cancer ([place])   . Stress    loss of father in Dec 2018.     PERTINENT MEDICATIONS:  Outpatient Encounter Medications as of 01/24/2019  Medication Sig  . amLODipine (NORVASC) 10 MG tablet Take 10 mg by mouth at bedtime.   . caspofungin (CANCIDAS) IVPB Inject 50 mg into the vein daily. Indication:  Candidemia Last Day of Therapy:  03/08/2019 Labs - Once weekly:  CBC/D and CMP  . doxazosin (CARDURA) 1 MG tablet Take 1 mg by mouth at bedtime.  . ferrous sulfate 325 (65 FE) MG tablet Take 1 tablet (325 mg total) by mouth 2 (two) times daily with a meal.  . gabapentin (NEURONTIN) 100 MG capsule Take 2 capsules (200 mg total) by mouth 2 (two) times daily.  [name] Kitchen guaiFENesin-dextromethorphan (ROBITUSSIN DM) 100-10 MG/5ML syrup Take 5 mLs by mouth every 4 (four) hours as needed for cough (chest congestion).  . hydrocortisone (ANUSOL-HC) 2.5 %  rectal cream Apply 1 application topically 4 (four) times daily as needed for hemorrhoids.  . lidocaine-prilocaine (EMLA) cream Apply 1 application topically as needed. Apply to portacath site 1-2 hours prior to use (Patient taking differently: Apply 1 application topically daily as needed (1-2 hours prior to portacath access). )  . loperamide (IMODIUM A-D) 2 MG tablet Take 1 tablet (2 mg total) by mouth as directed. (Patient taking differently: Take 2-4 mg by mouth 3 (three) times daily as needed for diarrhea or loose stools. )  . meropenem (MERREM) IVPB Inject 1 g into the vein every 8 (eight) hours. Indication:   Endocarditis and polymicrobial sepsis- enterococcus, bacteroides, grp C strep, S. anginosis Last Day of Therapy:  02/22/2019 Labs - Once weekly:  CBC/D and CMP  . olmesartan (BENICAR) 40 MG tablet Take 1 tablet (40 mg total) by mouth daily.  [name] Kitchen oxyCODONE-acetaminophen (PERCOCET) 10-325 MG tablet Take 1 tablet by mouth every 4 (four) hours as needed for pain.  . potassium chloride SA (K-DUR) 20 MEQ tablet Take 1 tablet (20 mEq total) by mouth 2 (two) times daily.  [name] Kitchen PRAMOXINE-ZINC ACETATE EX Apply 1 application topically daily as needed (itching).  . Probiotic Product (ALIGN) 4 MG CAPS Take 1 capsule by mouth daily.  . prochlorperazine (COMPAZINE) 10 MG tablet Take 1 tablet (10 mg total) by mouth every 6 (six) hours as needed for nausea or vomiting.  . tamsulosin (FLOMAX) 0.4 MG CAPS capsule Take 1 capsule (0.4 mg total) by mouth daily after supper. (Patient not taking: Reported on 01/06/2019)   No facility-administered encounter medications on file as of 01/24/2019.     PHYSICAL EXAM:   General: Chronically ill and  frail appearing male Pulmonary: Slight shortness of breath at rest Abdomen:enlarged Neurological: A&O. Appears weak Psych:  Appropriate mood and conversation  Gonzella Lex, NP-C

## 2019-01-25 ENCOUNTER — Encounter: Payer: Self-pay | Admitting: Nurse Practitioner

## 2019-01-25 ENCOUNTER — Other Ambulatory Visit: Payer: Self-pay

## 2019-01-25 ENCOUNTER — Inpatient Hospital Stay (HOSPITAL_BASED_OUTPATIENT_CLINIC_OR_DEPARTMENT_OTHER): Payer: BC Managed Care – PPO | Admitting: Nurse Practitioner

## 2019-01-25 VITALS — BP 117/74 | HR 59 | Temp 99.1°F | Resp 18 | Ht 77.0 in | Wt 225.7 lb

## 2019-01-25 DIAGNOSIS — R600 Localized edema: Secondary | ICD-10-CM

## 2019-01-25 DIAGNOSIS — I10 Essential (primary) hypertension: Secondary | ICD-10-CM | POA: Diagnosis not present

## 2019-01-25 DIAGNOSIS — R188 Other ascites: Secondary | ICD-10-CM

## 2019-01-25 DIAGNOSIS — I38 Endocarditis, valve unspecified: Secondary | ICD-10-CM

## 2019-01-25 DIAGNOSIS — C259 Malignant neoplasm of pancreas, unspecified: Secondary | ICD-10-CM

## 2019-01-25 DIAGNOSIS — C250 Malignant neoplasm of head of pancreas: Secondary | ICD-10-CM

## 2019-01-25 DIAGNOSIS — C787 Secondary malignant neoplasm of liver and intrahepatic bile duct: Secondary | ICD-10-CM

## 2019-01-25 DIAGNOSIS — A419 Sepsis, unspecified organism: Secondary | ICD-10-CM | POA: Diagnosis not present

## 2019-01-25 DIAGNOSIS — R627 Adult failure to thrive: Secondary | ICD-10-CM | POA: Diagnosis not present

## 2019-01-25 MED ORDER — OXYCODONE-ACETAMINOPHEN 10-325 MG PO TABS: 1.0000 | ORAL_TABLET | ORAL | 0 refills | Status: AC | PRN

## 2019-01-25 MED ORDER — SPIRONOLACTONE 100 MG PO TABS: 100.0000 mg | ORAL_TABLET | Freq: Every day | ORAL | 1 refills | Status: AC

## 2019-01-25 NOTE — Progress Notes (Addendum)
[facility] OFFICE PROGRESS NOTE   Diagnosis: Pancreas cancer  INTERVAL HISTORY:   Cory Kelly returns as scheduled.  He was hospitalized 01/09/2019 through 01/16/2019 with recurrent polymicrobial sepsis related to seeding from tumor and biliary obstruction.  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  He was discharged home on IV antibiotics with the plan to transition to home hospice care.  He denies fever.  He had a recent episode of "being cold".  He did not develop shaking chills.  Abdomen continues to be bloated.  He has bilateral leg edema.  He continues antibiotics.  He reports a good appetite.  Objective:  Vital signs in last 24 hours:  Blood pressure 117/74, pulse (!) 59, temperature 99.1 F (37.3 C), temperature source Oral, resp. rate 18, height '6\' 5"'$  (1.956 m), weight 225 lb 11.2 oz (102.4 kg), SpO2 100 %.    GI: Abdomen is markedly distended.  He appears to have ascites.  Abdominal wall edema. Vascular: Pitting edema throughout both legs. Neuro: Alert and oriented. Skin: Multiple ecchymoses over the dorsum of both hands. Port-A-Cath scar without erythema.  Left upper extremity PICC without erythema.   Lab Results:  Lab Results  Component Value Date   WBC 2.5 (L) 01/15/2019   HGB 9.2 (L) 01/15/2019   HCT 29.7 (L) 01/15/2019   MCV 91.1 01/15/2019   PLT 121 (L) 01/15/2019   NEUTROABS 2.0 01/15/2019    Imaging:  No results found.  Medications: I have reviewed the patient's current medications.  Assessment/Plan: 1. Pancreas head mass  CT abdomen/pelvis 08/03/2017-fullness in the head of the pancreas with stranding in the peripancreatic fat and pancreatic ductal dilatation.   MRI abdomen 08/04/2017-findings favored to represent acute on chronic pancreatitis; equivocal soft tissue fullness within the head and uncinate process of the pancreas; indeterminate too small to characterize right hepatic lobe lesion; small volume abdominal ascites; splenomegaly.    CA-19-9 elevated at 977on 08/04/2017.  Status postupper EUS 08/10/2017-findings of an irregular masslike region in the pancreatic head measuring approximately 2.7 cm. This directly abutted the main portal vein but no other major vascular structures. Biopsies obtained with preliminary cytology positive for malignancy, likely adenocarcinoma. The final report is pending. The common bile duct and main pancreatic duct were both dilated. ERCP was then proceeded with forstent placement. Multiple attempts were made tocannulate the bile duct without success. The procedure was aborted.  08/16/2017 ERCP with placement of a metal biliary stent in the common bile duct by Dr. Francella Solian at [facility].  Cycle 1 FOLFIRINOX 08/31/2017  Cycle 2 FOLFIRINOX 09/14/2017  Cycle 3 FOLFIRINOX 09/28/2017  Cycle 4 FOLFIRINOX 10/12/2017 (oxaliplatin dose reduced secondary to thrombocytopenia)  Cycle 5 FOLFIRINOX 10/26/2017  Cycle 6 FOLFIRINOX 11/09/2017  Restaging CTs at [facility] 11/29/2017-no definitive evidence of distant metastatic disease. 2 subcentimeter liver lesions described on prior MRI not visualized on CT. Ill-defined pancreatic head mass stable to decreased in size measuring 2.1 x 2 cm. Peripancreatic inflammatory stranding decreased from prior. No biliary ductal dilatation. Celiac axis less than 180 degrees abutment. Common hepatic artery with greater than 180 degrees encasement; superior mesenteric artery with short segment less than 180 degrees abutment; portal vein/superior mesenteric vein with greater than 180 degrees encasement of the extrahepatic portal vein with associated circumferential narrowing at the portomesenteric, overall substantially improved from prior examination. Now less than 180 degree abutment of the SMV. The portal vein and SMV remain patent. Splenic vein patent.  Cycle 1 gemcitabine/Abraxane 12/06/2017  Cycle 2 gemcitabine/Abraxane 12/20/2017  Cycle 3  gemcitabine/Abraxane 01/03/2018  Cycle  4 gemcitabine/Abraxane 01/17/2018  Cycle 5 gemcitabine 01/31/2018 (Abraxane held due to neuropathy)  Cycle 6 gemcitabine 02/14/2018 (Abraxane held due to neuropathy)  Cycle 7 gemcitabine 02/28/2018 (Abraxane held due to neuropathy)  CT chest/abdomen/pelvis at [facility] 03/08/2018-stable appearance of previously identified infiltrating pancreatic head mass. No CT evidence of metastatic disease.  SBRT to the pancreas 04/05/2018-04/11/2018  05/09/2018 CA-19-9 2138   MRI abdomen 05/09/2018-similar 6 mm hypoenhancing focus posterior right lobe liver; more superior lesion near the dome of the liver not identified on the postcontrast imaging but there is a persistent focus of diffusion signal abnormality in this region; new 8 mm hypoenhancing focus located just superior to the gallbladder; additional tiny focus of diffusion abnormality located more superiorly without obvious associated abnormal enhancement. Ill-defined pancreatic head mass not felt to be significantly changed.  CT chest/abdomen/pelvis 05/10/2018-ill-defined hypoattenuating pancreatic head mass and ill-defined soft tissue abutting the celiac axis with mildly increased dilatation of the main pancreatic duct. New soft tissue nodule along the right upper lobe bronchus; hypoattenuating liver lesions concerning for metastasis.  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 The stent traverses the pancreatic mass and extends into the duodenum. Increase in size of 2 liver lesions. Pancreatic mass appears similar. Indistinct tissue planes around the duodenum along with wall thickening in the gallbladder  and especially the gallbladder neck.  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 Diffuse hepatic metastasis. Previous index lesions unchanged in size. Single new metastasis identified within the medial segment left lobe of liver. 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 Abdominal ascites increased in the interval.  Cycle 7 FOLFIRINOX6/02/2019 2. Hypertension 3. Port-A-Cath placement 08/30/2017 4. History of elevated bilirubin-questionGilbert's syndrome 5. Mild leukopenia, thrombocytopenia-question secondary to portal  hypertension/splenomegaly;09/14/2017 white count improved, platelet count stable;progressive thrombocytopenia following gemcitabine/Abraxane 6. Kidney stones 7. Fever following cycles 2 and 3 gemcitabine/Abraxane,? Fever related to gemcitabine 8.Jaundice, elevated liver enzymes and bilirubin 06/22/2018; status post ERCP 06/25/2018 with debris removed from the existing stent and a new plastic stent placed. Recurrent  jaundice 07/19/2018 status post ERCP 07/20/2018 with finding that the previously placed plastic stent had migrated distally. There was debris and sludge on the stent. The previously placed stent was removed. 5 to 8 mm stricture just proximal to the existing SEMS. 2 plastic stents were placed.ERCP 08/16/2018-2 plastic biliary stents were removed from within the previously placed partially covered SEMS; stricture confirmed just proximal to the previously placed SEMS, treated with placement of an 8 cm long 10 mm diameter uncovered SEMS in good position. 9.Hospitalized with bacteremia Streptococcus group C 11/04/2018 through 11/08/2018. IV ceftriaxone through 11/17/2018. TEE 11/08/2018 negative. 10.Elevated LFTs 12/03/2018-MRI abdomen 12/04/2018 with mild increase in size of mass involving head of the pancreas. Diffuse hepatic metastasis. Previous index lesions unchanged in size. Single new metastasis identified within the medial segment left lobe of liver. 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 Abdominal ascites increased in the interval. 12/06/2018 ERCP-previously placed uncovered 8 cm long metal biliary stent nearly filled with bio debris and flecks of tissue ingrowth. Stent cleared. 11. 12/24/18-admission with sepsis syndrome, blood culture from right arm positive for Streptococcus Anginosis, blood culture from Port-A-Cath with a Bacteroides species 12.  12/28/2018-readmission with sepsis syndrome 13. 01/09/2019 -readmission with hyperbilirubinemia- ERCP  01/10/2019 confirmed tumor ingrowth and debris in the bile duct stent, status post debridement of the stent and placement of a stent and stent 14.  Echocardiogram 01/14/2019- moderate mobile vegetation seen on the anterior mitral leaflet.  Small to moderate mobile vegetation on the anterior leaflet of the mitral valve.   Disposition: Cory Kelly is a 63 year old man with metastatic pancreas cancer.  He was recently found to have recurrent polymicrobial sepsis related to seeding from tumor and biliary obstruction.  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  He was discharged from the hospital on IV antibiotics with the hope this would provide several weeks of palliation and improved out of hospital quality of life.  Subsequent plan to transition to home hospice care.  He appears stable.  He continues meropenem with a completion date of 02/22/2019 and caspofungin with a completion date of 03/08/2019.  He has follow-up with Dr. Prince Rome on 02/06/2019.  We discussed a referral to the hospice program once he has completed IV antibiotics/antifungal.  He is not interested in a hospice referral at this time.  He understands he is not a candidate for further chemotherapy.  He has significant ascites on exam today.  Cytology has been negative for malignant cells x2.  He declines a paracentesis.  He will contact the office to arrange for paracentesis if he becomes uncomfortable.  He will begin Aldactone 100 mg daily.  We discontinued several medications today including potassium, Benicar and Norvasc.  He will return for lab and follow-up in 2 weeks.  He will contact the office in the interim with any problems.  Patient seen with Dr. Benay Spice.  25 minutes were spent face-to-face at today's visit with the majority of that time involved in counseling/coordination of care.      Ned Card ANP/GNP-BC   01/25/2019  2:09 PM  This was a shared visit with Ned Card.  Cory Kelly was interviewed and examined.   He appears stable.  He indicated that he does not wish to enter a hospice program and would like to continue antibiotics.  He does not wish to follow a comfort care approach.  He will continue the current antibiotic regimen.  Cory Kelly return for an office visit in 2 weeks.  I  discussed the case with his wife by telephone.  We consolidated his medical regimen today. Julieanne Manson, MD

## 2019-01-28 ENCOUNTER — Telehealth: Payer: Self-pay | Admitting: *Deleted

## 2019-01-28 NOTE — Telephone Encounter (Signed)
Called to report that someone staying in the home informed her today that [place] was [place] the beach and will be there until Saturday, thus she was not able to provide PICC care or draw labs today. MD notified

## 2019-01-29 ENCOUNTER — Telehealth: Payer: Self-pay | Admitting: Nurse Practitioner

## 2019-01-29 ENCOUNTER — Telehealth: Payer: Self-pay | Admitting: *Deleted

## 2019-01-29 NOTE — Telephone Encounter (Signed)
Wife reports his swelling has moved down to his scrotum and penis (both very swollen). Asking if this is expected and what can be done for this?

## 2019-01-29 NOTE — Telephone Encounter (Signed)
Called and left msg about upcoming appt

## 2019-01-30 NOTE — Telephone Encounter (Signed)
Left VM for wife that this type of swelling is not uncommon in his situation. Try to elevate his legs and scrotum on a rolled up towel as much as possible. Continue the aldactone.

## 2019-01-31 ENCOUNTER — Ambulatory Visit: Payer: BC Managed Care – PPO | Admitting: Internal Medicine

## 2019-01-31 DIAGNOSIS — C259 Malignant neoplasm of pancreas, unspecified: Secondary | ICD-10-CM | POA: Diagnosis not present

## 2019-01-31 DIAGNOSIS — A401 Sepsis due to streptococcus, group B: Secondary | ICD-10-CM | POA: Diagnosis not present

## 2019-01-31 DIAGNOSIS — R7881 Bacteremia: Secondary | ICD-10-CM | POA: Diagnosis not present

## 2019-02-01 ENCOUNTER — Telehealth: Payer: Self-pay | Admitting: *Deleted

## 2019-02-01 NOTE — Telephone Encounter (Signed)
"  Cory Kelly, [facility] calling for guidance.  Patient's legs are really swollen per his daughter who is a Marine scientist.  Spironolactone 100 mg daily."

## 2019-02-01 NOTE — Telephone Encounter (Signed)
"  Cory Kelly's wife [name] [phone]).  1. Dr. Benay Spice had told me to notify him if he is having fevers daily.  He is end stage pancreatic cancer but this is happening.   2. Can we double up on his fluid pill because the swelling is bad."  "Do not have a thermometer, we're at the beach.  He feels feverish when I touch him.  Has gone a few days without but fever returned Wed, Thurs and this morning.  I give an Advil in between times of the Oxycodone/Tyenol."

## 2019-02-03 DIAGNOSIS — C250 Malignant neoplasm of head of pancreas: Secondary | ICD-10-CM | POA: Diagnosis not present

## 2019-02-04 ENCOUNTER — Telehealth: Payer: Self-pay | Admitting: *Deleted

## 2019-02-04 DIAGNOSIS — A401 Sepsis due to streptococcus, group B: Secondary | ICD-10-CM | POA: Diagnosis not present

## 2019-02-04 DIAGNOSIS — R7881 Bacteremia: Secondary | ICD-10-CM | POA: Diagnosis not present

## 2019-02-04 DIAGNOSIS — C259 Malignant neoplasm of pancreas, unspecified: Secondary | ICD-10-CM | POA: Diagnosis not present

## 2019-02-04 DIAGNOSIS — C250 Malignant neoplasm of head of pancreas: Secondary | ICD-10-CM | POA: Diagnosis not present

## 2019-02-04 NOTE — Telephone Encounter (Signed)
Called wife to f/u on status. She reports he is comfortable-temp low grade fever w/episodes of feeling chilled for about an hour. Hospice nurse came out yesterday and will return today to change PICC dressing.

## 2019-02-05 ENCOUNTER — Telehealth: Payer: Self-pay | Admitting: Internal Medicine

## 2019-02-05 DIAGNOSIS — C259 Malignant neoplasm of pancreas, unspecified: Secondary | ICD-10-CM | POA: Diagnosis not present

## 2019-02-05 DIAGNOSIS — A401 Sepsis due to streptococcus, group B: Secondary | ICD-10-CM | POA: Diagnosis not present

## 2019-02-05 DIAGNOSIS — C250 Malignant neoplasm of head of pancreas: Secondary | ICD-10-CM | POA: Diagnosis not present

## 2019-02-05 DIAGNOSIS — R7881 Bacteremia: Secondary | ICD-10-CM | POA: Diagnosis not present

## 2019-02-05 NOTE — Telephone Encounter (Signed)
F/U call to wife and patient.  He has transitioned to Hospice care at home.  Support given.  Discharged from Palliative care.  Gonzella Lex, NP-C

## 2019-02-05 NOTE — Telephone Encounter (Signed)
Received a voicemail message cancelling previously scheduled Palliative care F/U visit for today at 10am.  He stated that he would reschedule this for "next week".  PC will F/U with patient to reschedule.  Gonzella Lex, NP-C

## 2019-02-06 ENCOUNTER — Inpatient Hospital Stay: Payer: BC Managed Care – PPO | Admitting: Infectious Diseases

## 2019-02-06 ENCOUNTER — Telehealth: Payer: Self-pay

## 2019-02-06 DIAGNOSIS — C250 Malignant neoplasm of head of pancreas: Secondary | ICD-10-CM | POA: Diagnosis not present

## 2019-02-06 NOTE — Telephone Encounter (Signed)
Per Dr. Prince Rome called patient's wife to see how patient is feeling, and to reschedule his appointment with our office.If patient is in hospice he does not need to schedule follow up appointment.  Per Dr. Prince Rome if patient is in Hospice ask to see if patient would like picc pulled. If not hospice MD will need to follow up on picc management.  Left voicemail requesting patient or patient's wife call office back. [place]

## 2019-02-07 DIAGNOSIS — C250 Malignant neoplasm of head of pancreas: Secondary | ICD-10-CM | POA: Diagnosis not present

## 2019-02-07 NOTE — Telephone Encounter (Signed)
Spoke with Cory Kelly, patient has transitioned to Hospice.  She has 6 doses of meropenem, 5 of caspofungin with plenty of PICC supplies left at the house. Per Cory Kelly, hospice is ok with [name] completing his current antibiotics, but not refilling them.  Cory Kelly stated their nurse Santiago Glad thought the PICC could be helpful for end of life care and she is able to change dressings/provide PICC care.  Cory Kelly wanted to make sure that there is not a chance for cure, that [name] is indeed at end of life. RN spoke with Dr Prince Rome. He is ok with them completing antibiotics on hand, transitioning PICC care to hospice.  He stated that the antibiotics/antifungals are only allowing [name] to "tread water" at this point, and he expects [name] to decline once he completes them.  Santiago Glad confirmed information relayed by Cory Kelly, will send written notice that Hospice will transition PICC care orders to their provider.

## 2019-02-08 ENCOUNTER — Other Ambulatory Visit: Payer: BC Managed Care – PPO

## 2019-02-08 ENCOUNTER — Ambulatory Visit: Payer: BC Managed Care – PPO | Admitting: Oncology

## 2019-02-08 DIAGNOSIS — C250 Malignant neoplasm of head of pancreas: Secondary | ICD-10-CM | POA: Diagnosis not present

## 2019-02-08 NOTE — Telephone Encounter (Signed)
Thank you for clarifying.

## 2019-02-08 NOTE — Telephone Encounter (Signed)
Updated patient's wife 

## 2019-02-09 DIAGNOSIS — C250 Malignant neoplasm of head of pancreas: Secondary | ICD-10-CM | POA: Diagnosis not present

## 2019-02-10 DIAGNOSIS — C250 Malignant neoplasm of head of pancreas: Secondary | ICD-10-CM | POA: Diagnosis not present

## 2019-02-11 ENCOUNTER — Other Ambulatory Visit: Payer: Self-pay | Admitting: Oncology

## 2019-02-11 DIAGNOSIS — C250 Malignant neoplasm of head of pancreas: Secondary | ICD-10-CM

## 2019-02-12 DIAGNOSIS — C250 Malignant neoplasm of head of pancreas: Secondary | ICD-10-CM | POA: Diagnosis not present

## 2019-02-13 DIAGNOSIS — C250 Malignant neoplasm of head of pancreas: Secondary | ICD-10-CM | POA: Diagnosis not present

## 2019-02-14 DIAGNOSIS — C250 Malignant neoplasm of head of pancreas: Secondary | ICD-10-CM | POA: Diagnosis not present

## 2019-02-15 DIAGNOSIS — C250 Malignant neoplasm of head of pancreas: Secondary | ICD-10-CM | POA: Diagnosis not present

## 2019-02-16 DIAGNOSIS — C250 Malignant neoplasm of head of pancreas: Secondary | ICD-10-CM | POA: Diagnosis not present

## 2019-02-17 DIAGNOSIS — C250 Malignant neoplasm of head of pancreas: Secondary | ICD-10-CM | POA: Diagnosis not present

## 2019-02-18 DIAGNOSIS — C250 Malignant neoplasm of head of pancreas: Secondary | ICD-10-CM | POA: Diagnosis not present

## 2019-02-19 DIAGNOSIS — C250 Malignant neoplasm of head of pancreas: Secondary | ICD-10-CM | POA: Diagnosis not present

## 2019-02-20 DIAGNOSIS — C250 Malignant neoplasm of head of pancreas: Secondary | ICD-10-CM | POA: Diagnosis not present

## 2019-02-21 DIAGNOSIS — C250 Malignant neoplasm of head of pancreas: Secondary | ICD-10-CM | POA: Diagnosis not present

## 2019-02-22 DIAGNOSIS — C250 Malignant neoplasm of head of pancreas: Secondary | ICD-10-CM | POA: Diagnosis not present

## 2019-02-23 DIAGNOSIS — C250 Malignant neoplasm of head of pancreas: Secondary | ICD-10-CM | POA: Diagnosis not present

## 2019-02-24 DIAGNOSIS — C250 Malignant neoplasm of head of pancreas: Secondary | ICD-10-CM | POA: Diagnosis not present

## 2019-02-25 DIAGNOSIS — C250 Malignant neoplasm of head of pancreas: Secondary | ICD-10-CM | POA: Diagnosis not present

## 2019-02-26 DIAGNOSIS — C250 Malignant neoplasm of head of pancreas: Secondary | ICD-10-CM | POA: Diagnosis not present

## 2019-02-27 DIAGNOSIS — C250 Malignant neoplasm of head of pancreas: Secondary | ICD-10-CM | POA: Diagnosis not present

## 2019-02-28 DIAGNOSIS — C250 Malignant neoplasm of head of pancreas: Secondary | ICD-10-CM | POA: Diagnosis not present

## 2019-03-01 DIAGNOSIS — C250 Malignant neoplasm of head of pancreas: Secondary | ICD-10-CM | POA: Diagnosis not present

## 2019-03-02 DIAGNOSIS — C250 Malignant neoplasm of head of pancreas: Secondary | ICD-10-CM | POA: Diagnosis not present

## 2019-03-03 DIAGNOSIS — C250 Malignant neoplasm of head of pancreas: Secondary | ICD-10-CM | POA: Diagnosis not present

## 2019-03-04 DIAGNOSIS — C250 Malignant neoplasm of head of pancreas: Secondary | ICD-10-CM | POA: Diagnosis not present

## 2019-03-15 ENCOUNTER — Telehealth: Payer: Self-pay | Admitting: *Deleted

## 2019-03-15 NOTE — Telephone Encounter (Signed)
Called to report he was pronounced dead on 2019-04-13 at 4:39 pm in the home.

## 2019-04-04 DEATH — deceased

## 2019-05-06 ENCOUNTER — Encounter: Payer: Self-pay | Admitting: Gastroenterology

## 2019-05-06 NOTE — Telephone Encounter (Signed)
Error

## 2020-03-06 IMAGING — CT CT ABDOMEN AND PELVIS WITH CONTRAST
2 of 8 series · 14 of 46 positions shown, 18 images · IV contrast (OMNIPAQUE)
Comparison: 12/24/2018

CLINICAL DATA: Fall, low back pain, pancreatic cancer with biliary
stent placement

EXAM:
CT ABDOMEN AND PELVIS WITH CONTRAST
TECHNIQUE: Multidetector CT imaging of the abdomen and pelvis was performed
using the standard protocol following bolus administration of
intravenous contrast.
CONTRAST:  100mL OMNIPAQUE IOHEXOL 300 MG/ML  SOLN

[Series 3: axial st · axial · 0.84mm/px · z∈[+1128,+1608]mm · 11 of 108 slices shown, 15 images]
[im 6/108  soft-tissue]
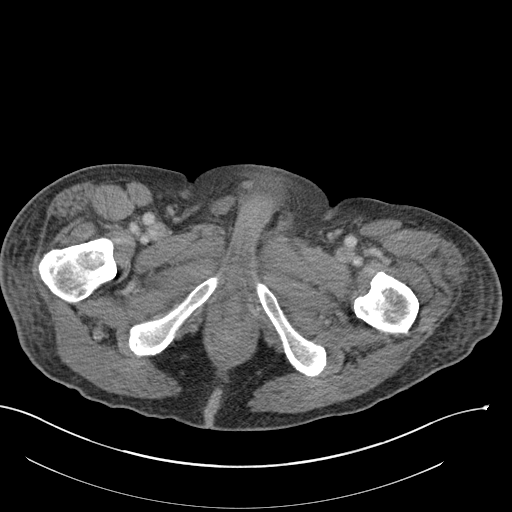
[im 6/108  bone]
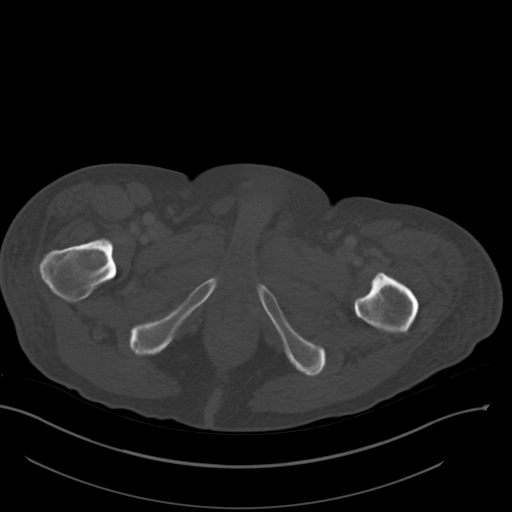
[im 18/108  soft-tissue]
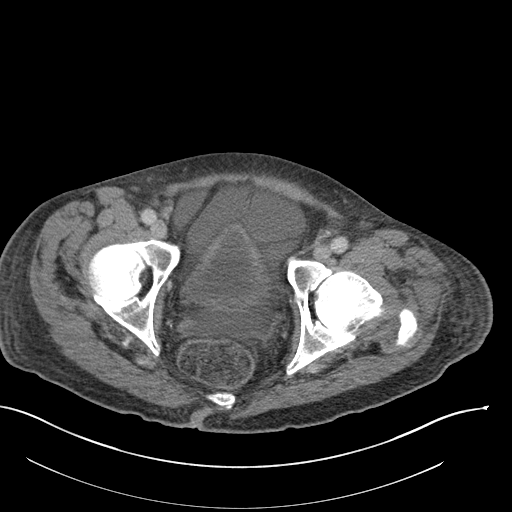
[im 30/108  soft-tissue]
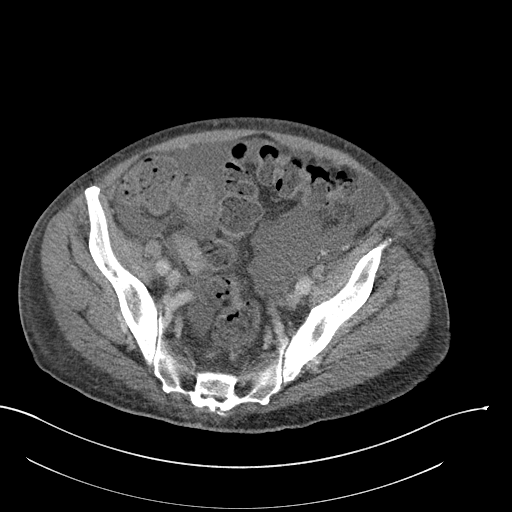
[im 42/108  soft-tissue]
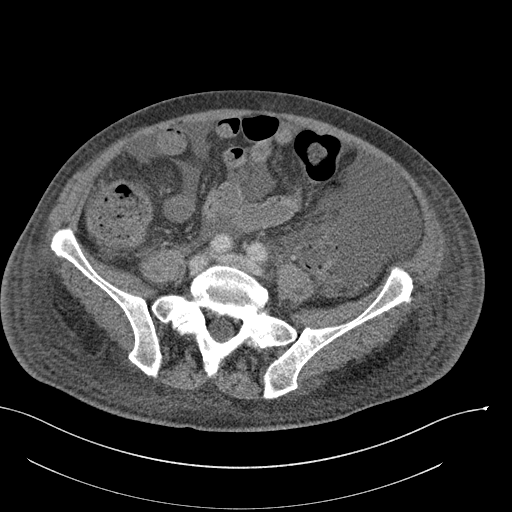
[im 54/108  soft-tissue]
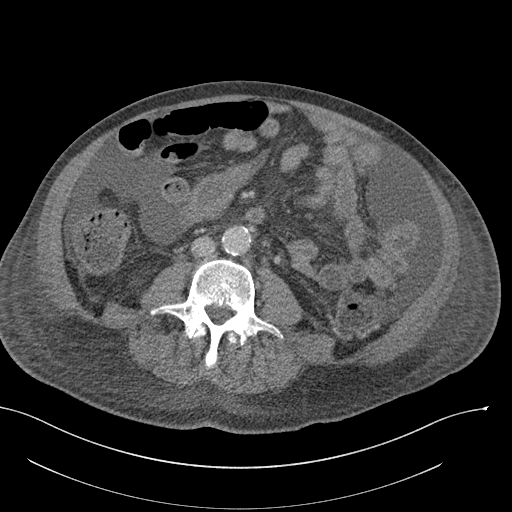
[im 66/108  soft-tissue]
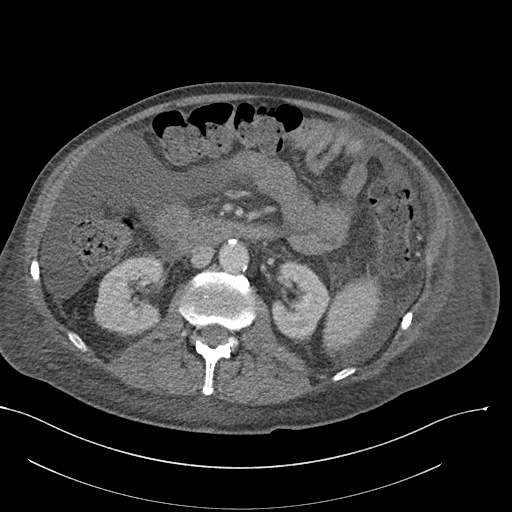
[im 78/108  soft-tissue]
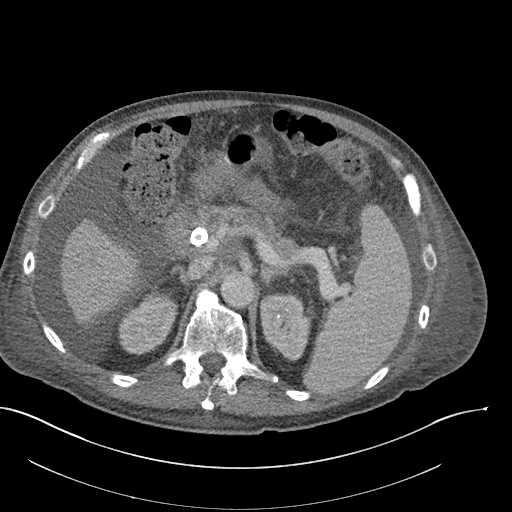
[im 84/108  lung]
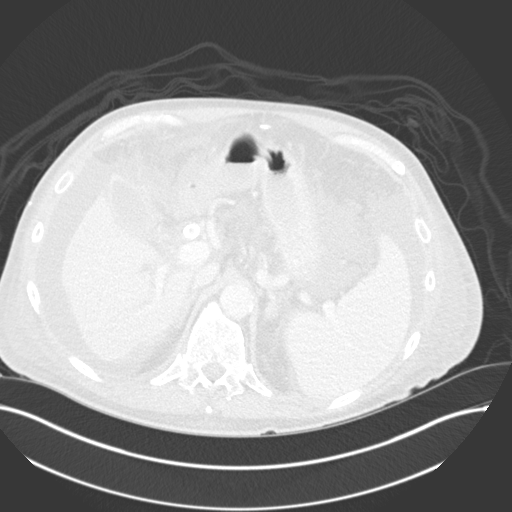
[im 90/108  soft-tissue]
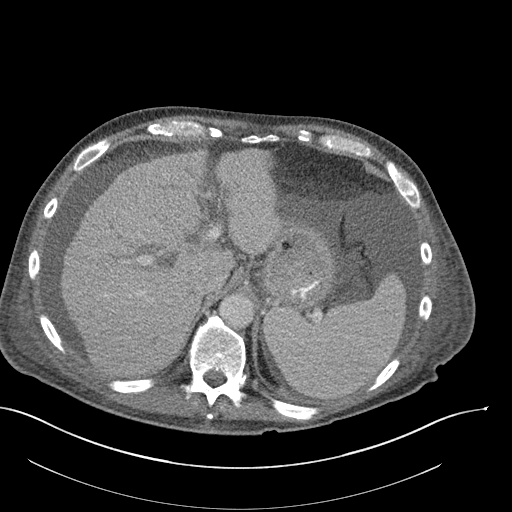
[im 90/108  lung]
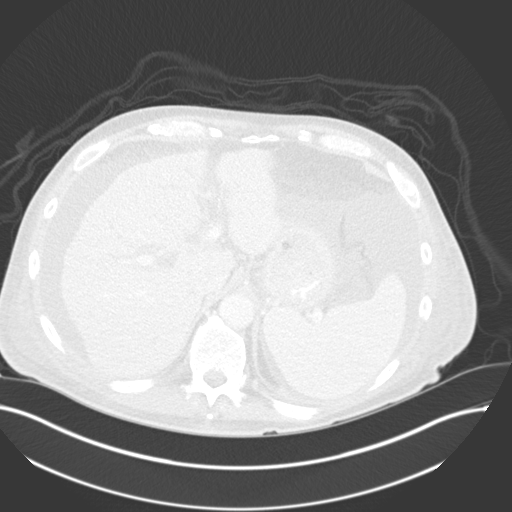
[im 96/108  lung]
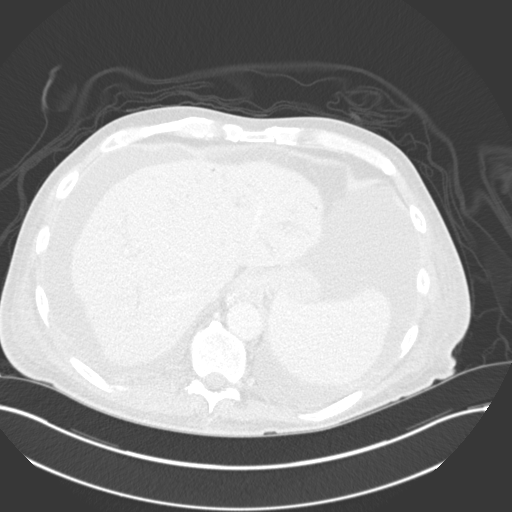
[im 102/108  soft-tissue]
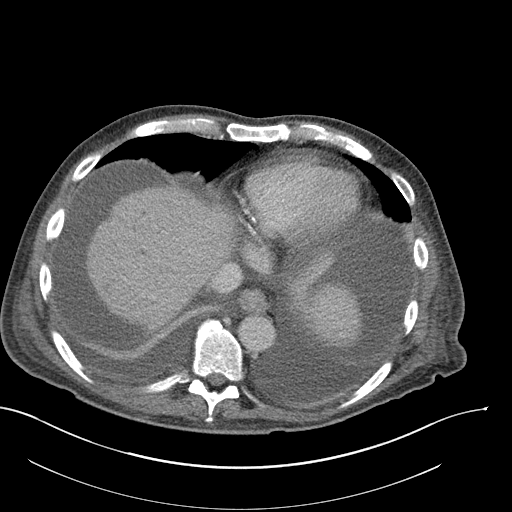
[im 102/108  lung]
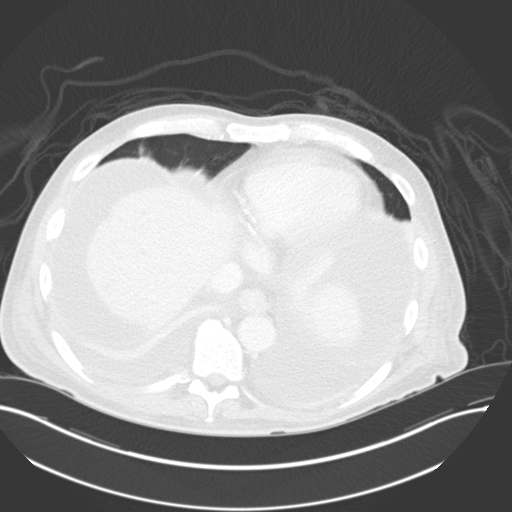
[im 102/108  bone]
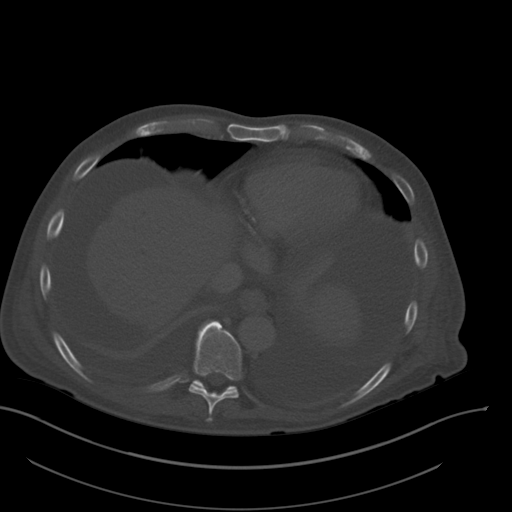

[Series 6: coronal st · coronal · 0.85mm/px · 3 of 151 slices shown]
[im 38/151  soft-tissue]
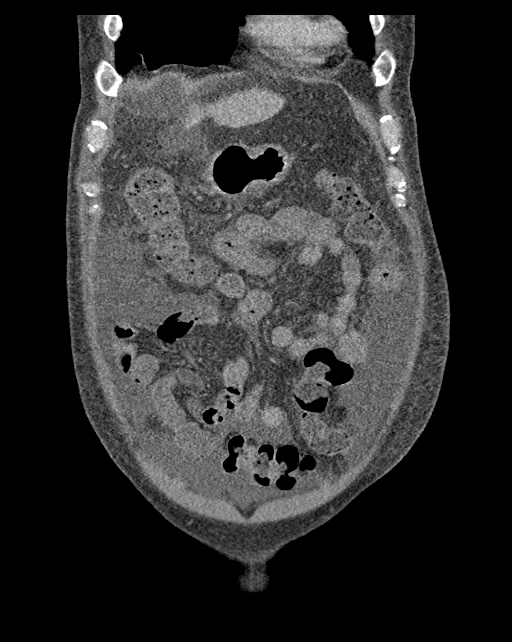
[im 76/151  soft-tissue]
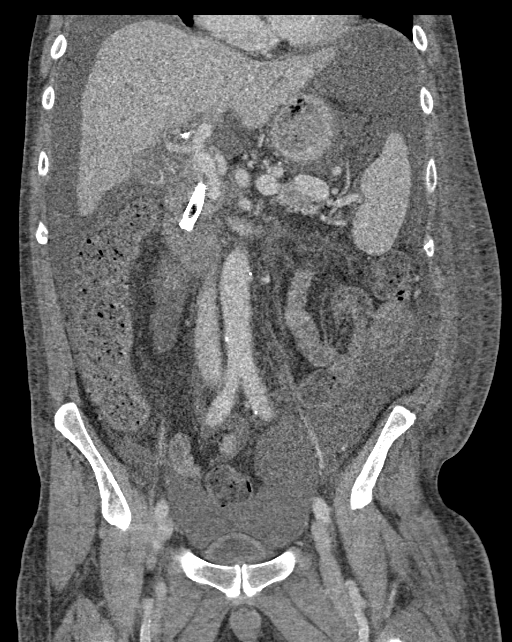
[im 113/151  soft-tissue]
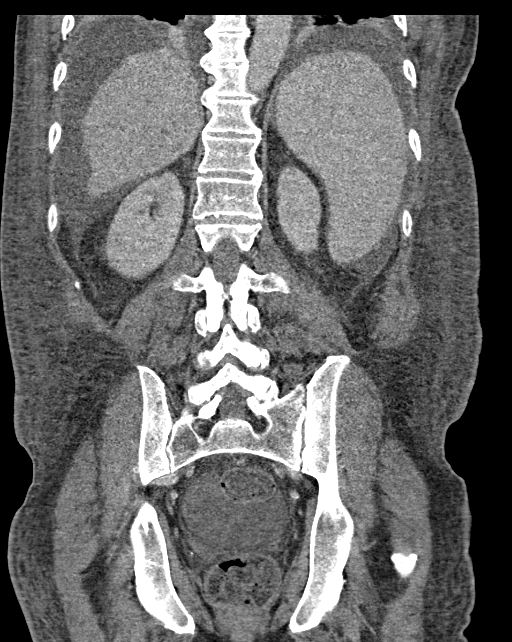

[14 of 46 positions shown; findings below may reference images not displayed]

FINDINGS: Lower chest: Small to moderate bilateral pleural effusions and
associated atelectasis or consolidation, new compared to prior
examination. Coronary artery calcifications.

Hepatobiliary: No solid liver abnormality is seen. Redemonstrated
common bile duct stent with slightly increased biliary ductal
dilatation and redemonstrated post stenting pneumobilia. No change
in appearance or configuration of stent.

Pancreas: Primary pancreatic mass is not clearly appreciated on this
examination, with atrophy and ductal dilatation of the pancreatic
body and tail.

Spleen: Splenomegaly, maximum coronal span 19.3 cm.

Adrenals/Urinary Tract: Adrenal glands are unremarkable.
Nonobstructive bilateral nephrolithiasis. No hydronephrosis. Bladder
is unremarkable.

Stomach/Bowel: Stomach is within normal limits. Appendix appears
normal. No evidence of bowel wall thickening, distention, or
inflammatory changes.

Vascular/Lymphatic: Aortic atherosclerosis. No enlarged abdominal or
pelvic lymph nodes.

Reproductive: No mass or other significant abnormality.

Other: No abdominal wall hernia or abnormality. Moderate to large
volume ascites, which is increased compared to prior examination.
Anasarca.

Musculoskeletal: There are mildly displaced fracture heads of the
right tenth and eleventh ribs as well as the right transverse
process of the T10 vertebral body.
IMPRESSION: 1. There are mildly displaced fracture heads of the right tenth and
eleventh ribs as well as the right transverse process of the T10
vertebral body.

2. Redemonstrated common bile duct stent with slightly increased
biliary ductal dilatation and redemonstrated post stenting
pneumobilia. No change in appearance or configuration of stent.
Primary pancreatic mass is not clearly appreciated on this
examination, with atrophy and ductal dilatation of the pancreatic
body and tail.

3. Pleural effusions, ascites, and anasarca, new and increased
compared to prior examination.

## 2020-03-07 IMAGING — RF ERCP
1 series · 13 of 13 positions shown · non-contrast
Comparison: CT abdomen pelvis-01/09/2019

CLINICAL DATA: ERCP with biliary stent placement.

EXAM:
ERCP
TECHNIQUE: Multiple spot images obtained with the fluoroscopic device and
submitted for interpretation post-procedure.
FLUOROSCOPY TIME:  3 minutes, 17 seconds (131 mGy)

[Series 1: run · 13 of 13 slices shown]
[im 1/13]
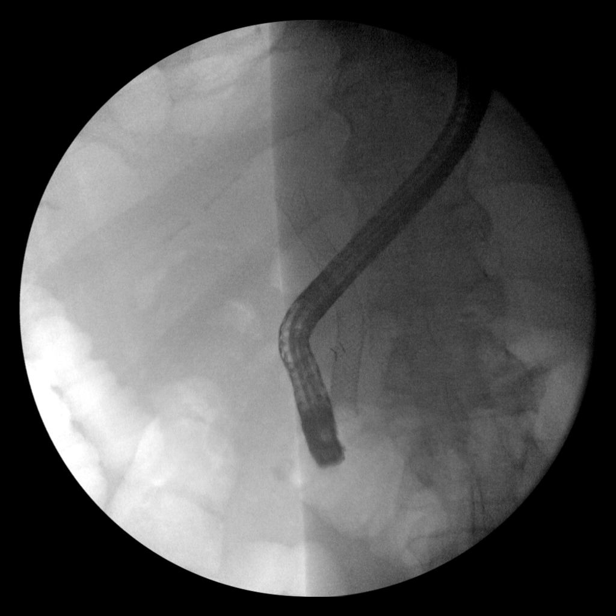
[im 2/13]
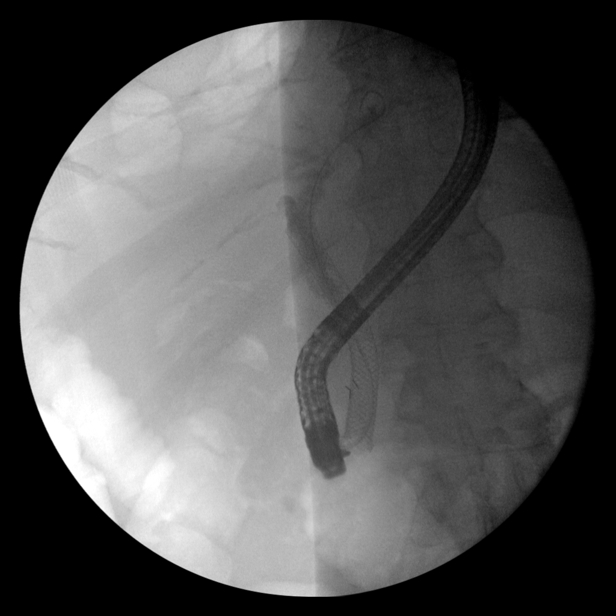
[im 3/13]
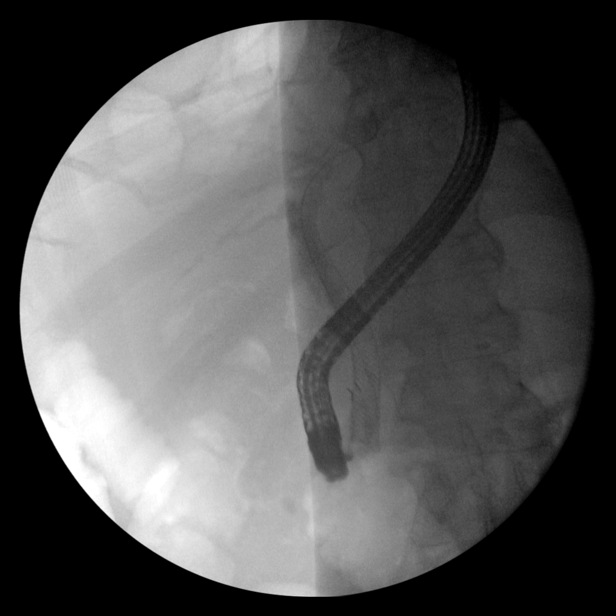
[im 4/13]
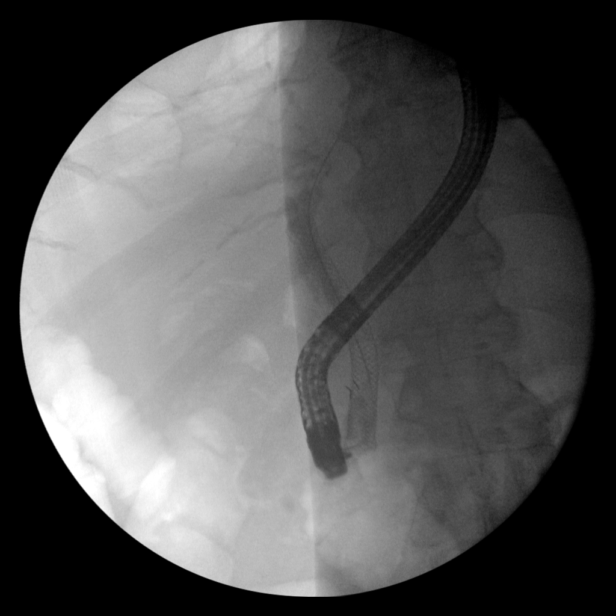
[im 5/13]
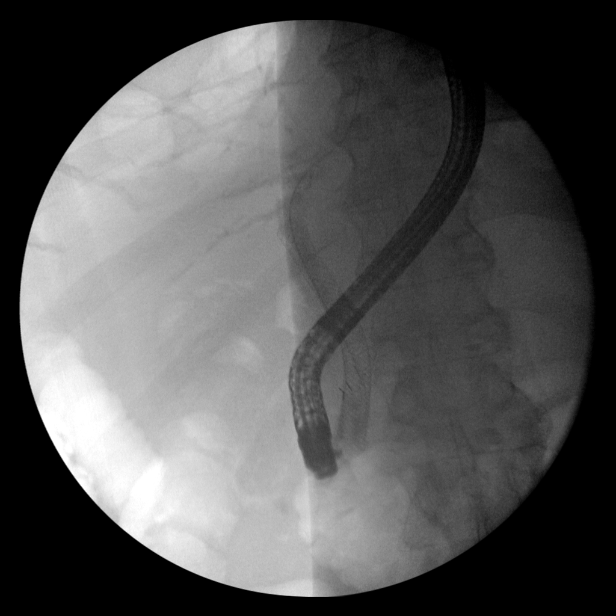
[im 6/13]
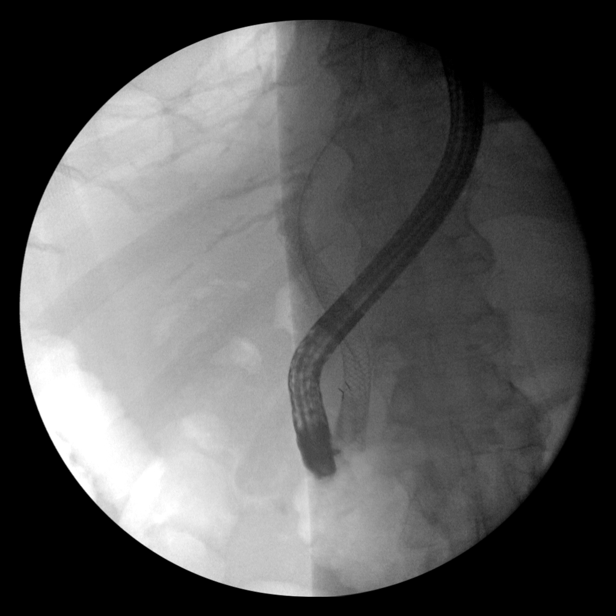
[im 7/13]
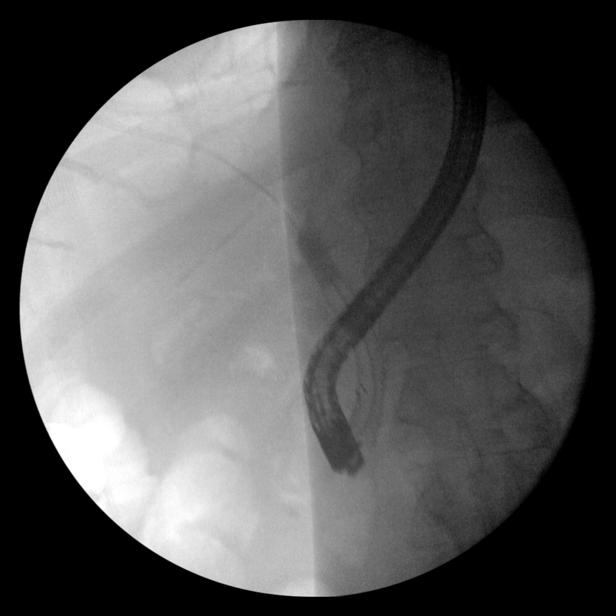
[im 8/13]
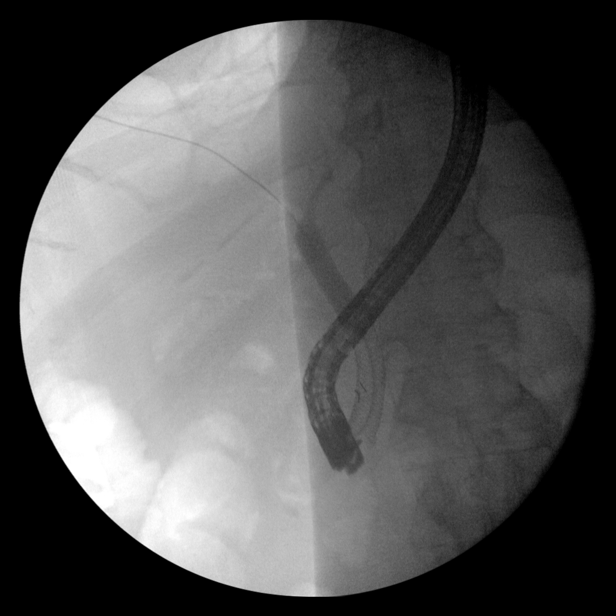
[im 9/13]
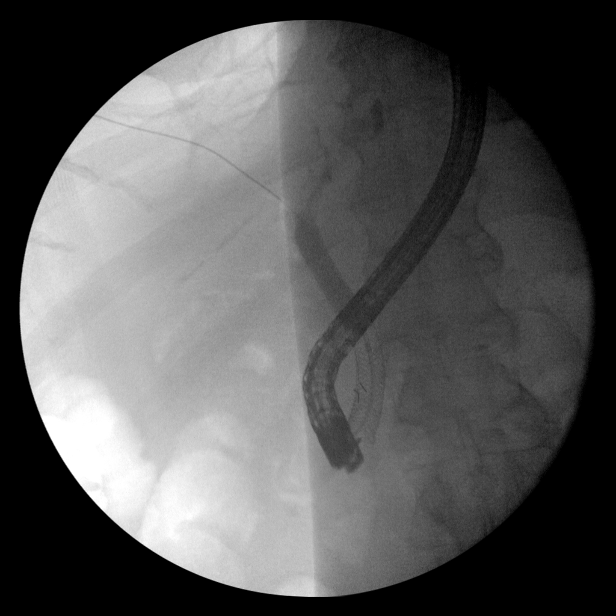
[im 10/13]
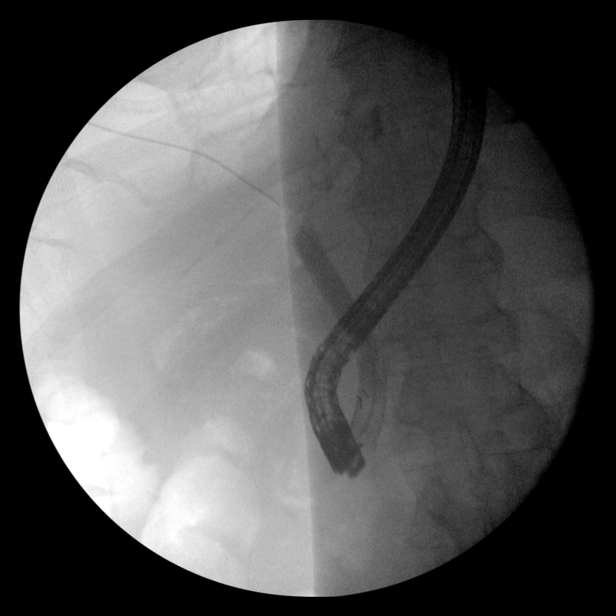
[im 11/13]
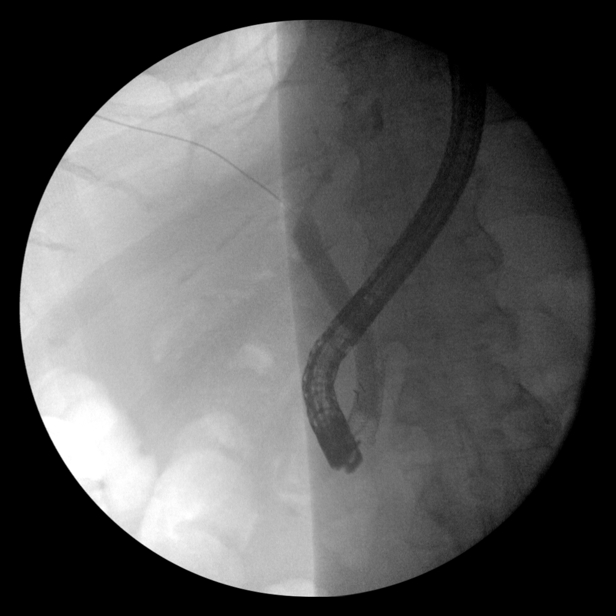
[im 12/13]
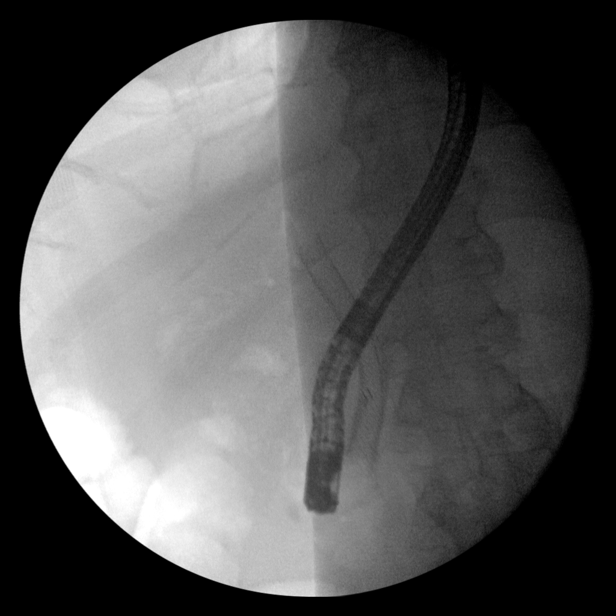
[im 13/13]
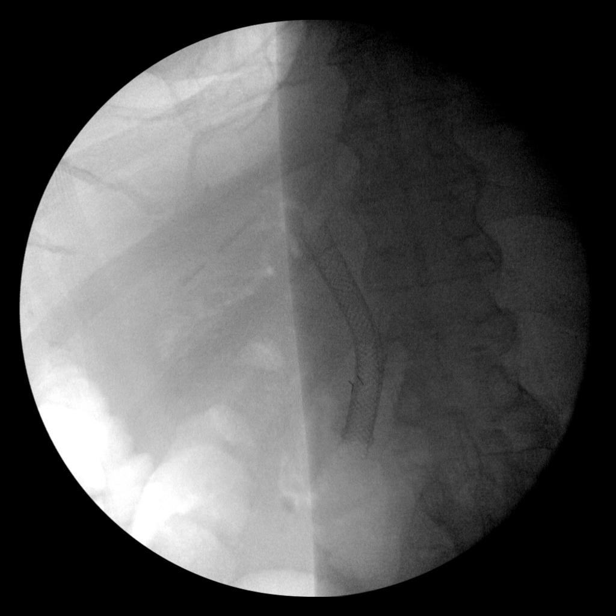

[13 of 13 positions shown; findings below may reference images not displayed]

FINDINGS: 6 spot intraoperative fluoroscopic images of the right upper
abdominal quadrant during ERCP are provided for review.

Initial image demonstrates an ERCP probe overlying the right upper
abdominal quadrant. Overlapping metallic internal biliary stents
overlie the expected location of the CBD. Surgical clips overlie the
expected location of the pancreatic head.

Subsequent images demonstrate selective cannulation and
opacification of the biliary stents.

There is suboptimal opacification the mid and distal aspects of the
biliary stent, potentially secondary to subtotal occlusion.

Subsequent images demonstrate presumed biliary sweeping and clearing
of the biliary stents.

There is faint opacification of the intrahepatic biliary tree which
appears nondilated.
IMPRESSION: ERCP with presumed internal biliary stent sleeping/clearing.

These images were submitted for radiologic interpretation only.
Please see the procedural report for the amount of contrast and the
fluoroscopy time utilized.

## 2020-03-07 IMAGING — US PARACENTESIS WITH ULTRASOUND GUIDANCE
1 series · 5 of 5 positions shown · non-contrast
Comparison: none

INDICATION: Patient with history of metastatic pancreatic cancer with recurrent
malignant ascites. Request is made for diagnostic and therapeutic
paracentesis.

[Series 1: paracentesis with ultrasound guidance · 5 of 5 slices shown]
[im 1/5]
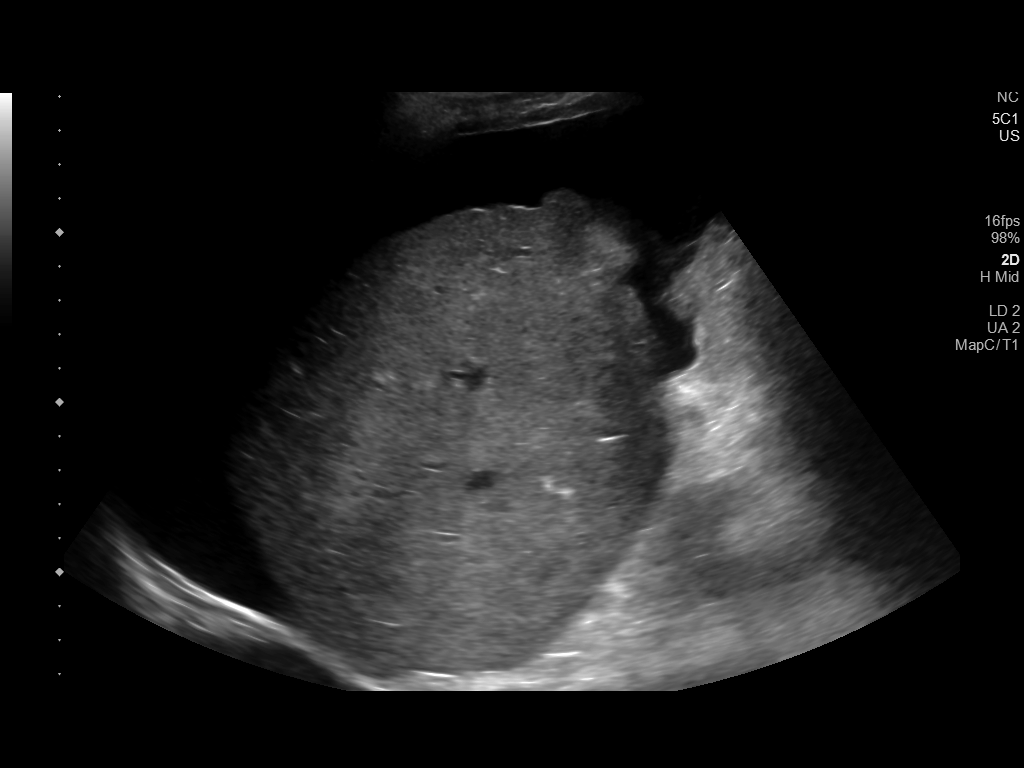
[im 2/5]
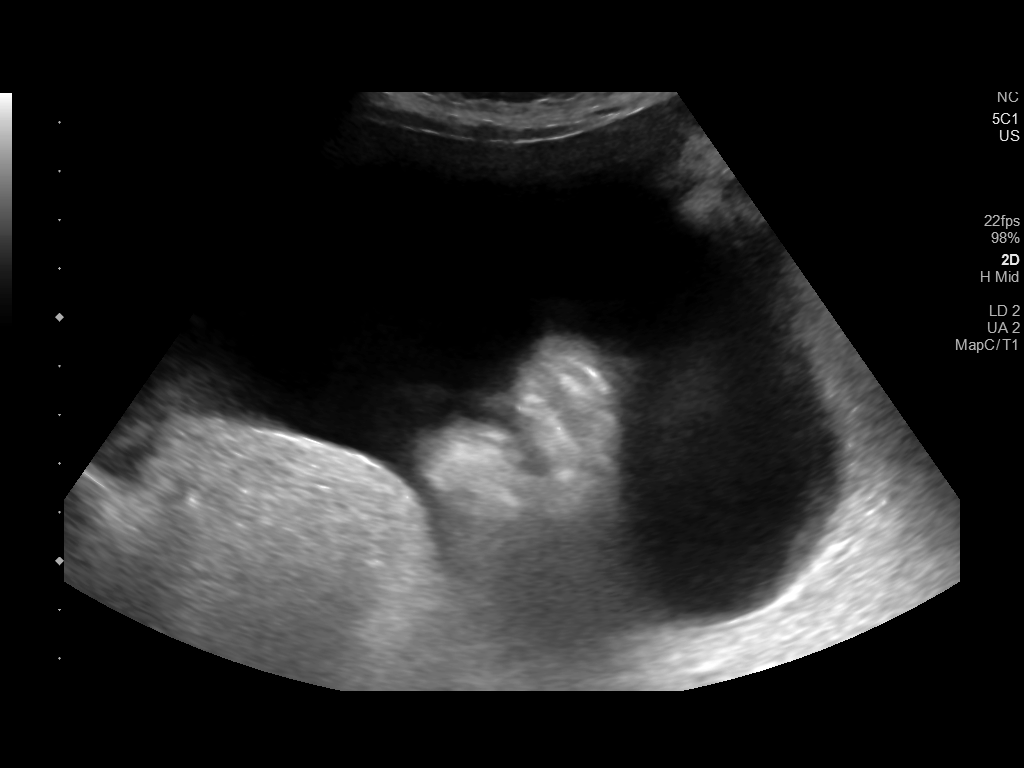
[im 3/5]
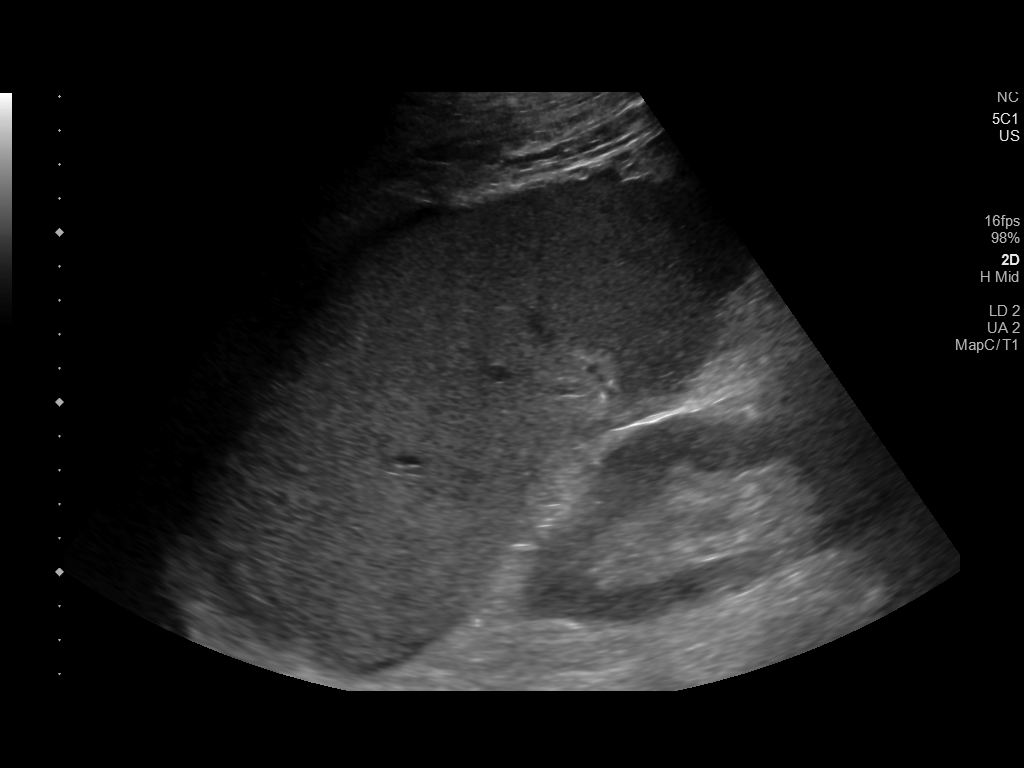
[im 4/5]
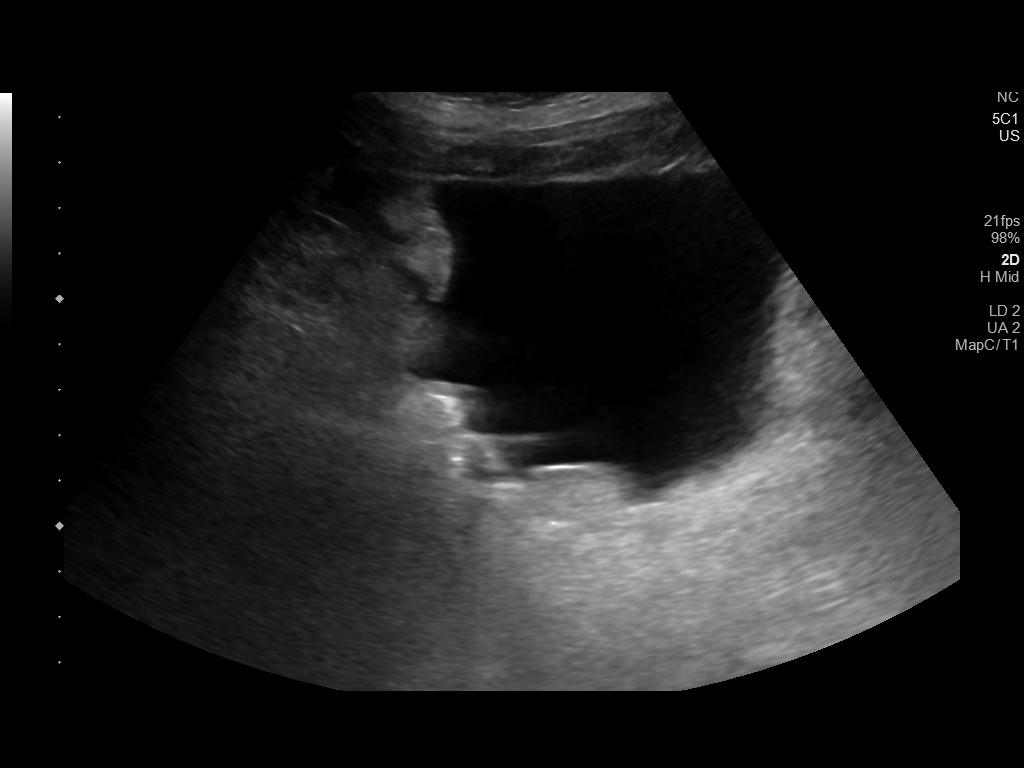
[im 5/5]
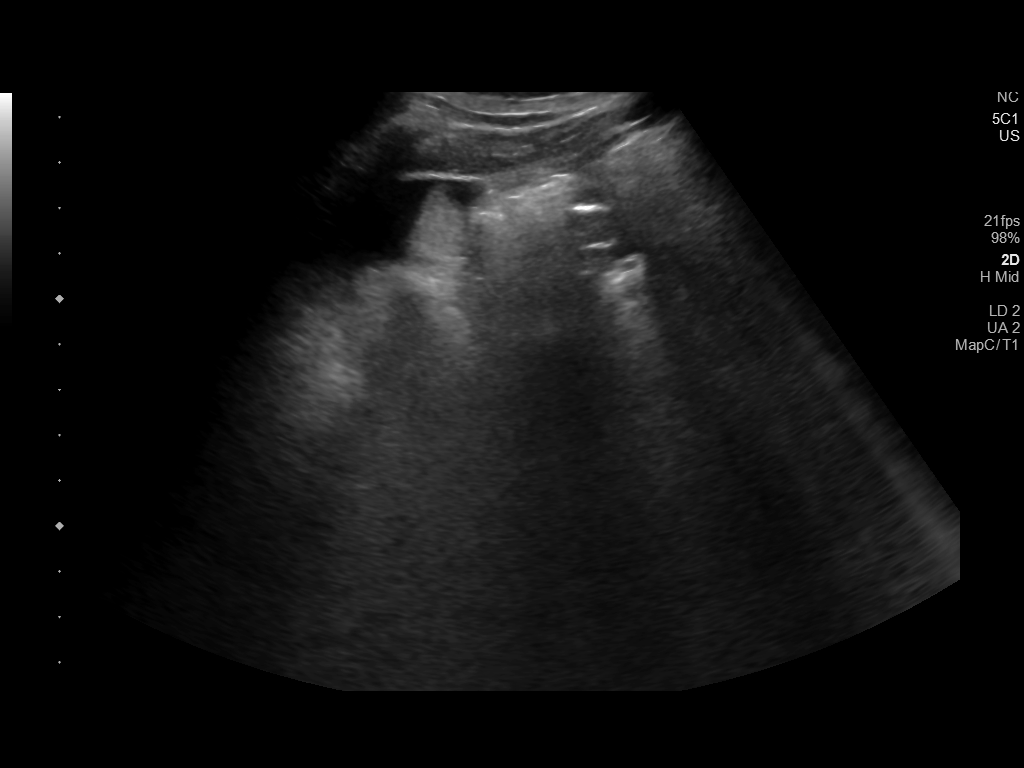

[5 of 5 positions shown; findings below may reference images not displayed]

EXAM:
ULTRASOUND GUIDED DIAGNOSTIC AND THERAPEUTIC PARACENTESIS

MEDICATIONS:
10 mL 1% lidocaine

COMPLICATIONS:
None immediate.

PROCEDURE:
Informed written consent was obtained from the patient after a
discussion of the risks, benefits and alternatives to treatment. A
timeout was performed prior to the initiation of the procedure.

Initial ultrasound scanning demonstrates a moderate amount of
ascites within the right lower abdominal quadrant. The right lower
abdomen was prepped and draped in the usual sterile fashion. 1%
lidocaine was used for local anesthesia.

Following this, a 19 gauge, 7-cm, Yueh catheter was introduced. An
ultrasound image was saved for documentation purposes. The
paracentesis was performed. The catheter was removed and a dressing
was applied. The patient tolerated the procedure well without
immediate post procedural complication.
FINDINGS: A total of approximately 3.35 L of clear gold fluid was removed.
Samples were sent to the laboratory as requested by the clinical
team.
IMPRESSION: Successful ultrasound-guided paracentesis yielding 3.35 L of
peritoneal fluid.
